# Patient Record
Sex: Male | Born: 1955 | Race: Black or African American | Hispanic: No | Marital: Married | Smoking: Current every day smoker
Health system: Southern US, Community
[De-identification: ages and names within clinical notes are randomized; demographics above are authoritative.]

## PROBLEM LIST (undated history)

## (undated) ENCOUNTER — Emergency Department (HOSPITAL_COMMUNITY): Disposition: A | Discharge status: Home / Self Care | Payer: Self-pay

## (undated) DIAGNOSIS — Z8711 Personal history of peptic ulcer disease: Secondary | ICD-10-CM

## (undated) DIAGNOSIS — Z9989 Dependence on other enabling machines and devices: Secondary | ICD-10-CM

## (undated) DIAGNOSIS — R51 Headache: Secondary | ICD-10-CM

## (undated) DIAGNOSIS — H269 Unspecified cataract: Secondary | ICD-10-CM

## (undated) DIAGNOSIS — K259 Gastric ulcer, unspecified as acute or chronic, without hemorrhage or perforation: Secondary | ICD-10-CM

## (undated) DIAGNOSIS — W3400XA Accidental discharge from unspecified firearms or gun, initial encounter: Secondary | ICD-10-CM

## (undated) DIAGNOSIS — K219 Gastro-esophageal reflux disease without esophagitis: Secondary | ICD-10-CM

## (undated) DIAGNOSIS — R9431 Abnormal electrocardiogram [ECG] [EKG]: Secondary | ICD-10-CM

## (undated) DIAGNOSIS — I10 Essential (primary) hypertension: Secondary | ICD-10-CM

## (undated) DIAGNOSIS — E119 Type 2 diabetes mellitus without complications: Secondary | ICD-10-CM

## (undated) DIAGNOSIS — R519 Headache, unspecified: Secondary | ICD-10-CM

## (undated) DIAGNOSIS — J189 Pneumonia, unspecified organism: Secondary | ICD-10-CM

## (undated) DIAGNOSIS — C801 Malignant (primary) neoplasm, unspecified: Secondary | ICD-10-CM

## (undated) DIAGNOSIS — F1010 Alcohol abuse, uncomplicated: Secondary | ICD-10-CM

## (undated) DIAGNOSIS — Z973 Presence of spectacles and contact lenses: Secondary | ICD-10-CM

## (undated) DIAGNOSIS — IMO0002 Reserved for concepts with insufficient information to code with codable children: Secondary | ICD-10-CM

## (undated) DIAGNOSIS — Z8719 Personal history of other diseases of the digestive system: Secondary | ICD-10-CM

## (undated) DIAGNOSIS — I639 Cerebral infarction, unspecified: Secondary | ICD-10-CM

## (undated) DIAGNOSIS — I219 Acute myocardial infarction, unspecified: Secondary | ICD-10-CM

## (undated) DIAGNOSIS — H409 Unspecified glaucoma: Secondary | ICD-10-CM

## (undated) HISTORY — DX: Unspecified cataract: H26.9

## (undated) HISTORY — PX: UPPER GASTROINTESTINAL ENDOSCOPY: SHX188

## (undated) HISTORY — DX: Type 2 diabetes mellitus without complications: E11.9

## (undated) HISTORY — PX: SHOULDER SURGERY: SHX246

## (undated) HISTORY — PX: ABDOMINAL SURGERY: SHX537

## (undated) HISTORY — PX: ANTRECTOMY: SHX5722

---
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
## 1997-08-24 ENCOUNTER — Emergency Department (HOSPITAL_COMMUNITY): Admission: EM | Admit: 1997-08-24 | Discharge: 1997-08-24 | Payer: Self-pay | Admitting: Emergency Medicine

## 1997-10-06 ENCOUNTER — Emergency Department (HOSPITAL_COMMUNITY): Admission: EM | Admit: 1997-10-06 | Discharge: 1997-10-06 | Payer: Self-pay | Admitting: *Deleted

## 1998-04-30 ENCOUNTER — Emergency Department (HOSPITAL_COMMUNITY): Admission: EM | Admit: 1998-04-30 | Discharge: 1998-04-30 | Payer: Self-pay | Admitting: Emergency Medicine

## 1998-05-06 ENCOUNTER — Emergency Department (HOSPITAL_COMMUNITY): Admission: EM | Admit: 1998-05-06 | Discharge: 1998-05-06 | Payer: Self-pay | Admitting: Emergency Medicine

## 1998-09-20 ENCOUNTER — Emergency Department (HOSPITAL_COMMUNITY): Admission: EM | Admit: 1998-09-20 | Discharge: 1998-09-20 | Payer: Self-pay | Admitting: Emergency Medicine

## 2002-03-21 ENCOUNTER — Encounter: Payer: Self-pay | Admitting: Emergency Medicine

## 2002-03-21 ENCOUNTER — Emergency Department (HOSPITAL_COMMUNITY): Admission: EM | Admit: 2002-03-21 | Discharge: 2002-03-21 | Payer: Self-pay | Admitting: Emergency Medicine

## 2003-02-07 ENCOUNTER — Encounter: Payer: Self-pay | Admitting: Emergency Medicine

## 2003-02-07 ENCOUNTER — Inpatient Hospital Stay (HOSPITAL_COMMUNITY): Admission: EM | Admit: 2003-02-07 | Discharge: 2003-02-11 | Payer: Self-pay | Admitting: Emergency Medicine

## 2003-02-08 ENCOUNTER — Encounter: Payer: Self-pay | Admitting: Internal Medicine

## 2003-02-10 ENCOUNTER — Encounter: Payer: Self-pay | Admitting: Internal Medicine

## 2003-05-03 ENCOUNTER — Emergency Department (HOSPITAL_COMMUNITY): Admission: EM | Admit: 2003-05-03 | Discharge: 2003-05-03 | Payer: Self-pay | Admitting: Emergency Medicine

## 2005-06-06 ENCOUNTER — Inpatient Hospital Stay (HOSPITAL_COMMUNITY): Admission: EM | Admit: 2005-06-06 | Discharge: 2005-06-10 | Payer: Self-pay | Admitting: Emergency Medicine

## 2006-12-28 ENCOUNTER — Ambulatory Visit: Payer: Self-pay | Admitting: Family Medicine

## 2006-12-28 ENCOUNTER — Inpatient Hospital Stay (HOSPITAL_COMMUNITY): Admission: EM | Admit: 2006-12-28 | Discharge: 2007-01-01 | Payer: Self-pay | Admitting: Emergency Medicine

## 2007-01-15 ENCOUNTER — Inpatient Hospital Stay (HOSPITAL_COMMUNITY): Admission: EM | Admit: 2007-01-15 | Discharge: 2007-01-18 | Payer: Self-pay | Admitting: Emergency Medicine

## 2007-04-02 ENCOUNTER — Emergency Department (HOSPITAL_COMMUNITY): Admission: EM | Admit: 2007-04-02 | Discharge: 2007-04-02 | Payer: Self-pay | Admitting: Emergency Medicine

## 2007-05-07 ENCOUNTER — Inpatient Hospital Stay (HOSPITAL_COMMUNITY): Admission: EM | Admit: 2007-05-07 | Discharge: 2007-05-09 | Payer: Self-pay | Admitting: Emergency Medicine

## 2007-05-07 ENCOUNTER — Ambulatory Visit: Payer: Self-pay | Admitting: Family Medicine

## 2007-05-20 ENCOUNTER — Inpatient Hospital Stay (HOSPITAL_COMMUNITY): Admission: EM | Admit: 2007-05-20 | Discharge: 2007-05-26 | Payer: Self-pay | Admitting: Emergency Medicine

## 2007-05-20 ENCOUNTER — Ambulatory Visit: Payer: Self-pay | Admitting: Family Medicine

## 2007-05-29 ENCOUNTER — Inpatient Hospital Stay (HOSPITAL_COMMUNITY): Admission: EM | Admit: 2007-05-29 | Discharge: 2007-05-31 | Payer: Self-pay | Admitting: Emergency Medicine

## 2007-05-29 ENCOUNTER — Ambulatory Visit: Payer: Self-pay | Admitting: Family Medicine

## 2007-06-19 ENCOUNTER — Encounter: Payer: Self-pay | Admitting: *Deleted

## 2007-07-04 ENCOUNTER — Emergency Department (HOSPITAL_COMMUNITY): Admission: EM | Admit: 2007-07-04 | Discharge: 2007-07-04 | Payer: Self-pay | Admitting: Emergency Medicine

## 2007-08-19 ENCOUNTER — Ambulatory Visit: Payer: Self-pay | Admitting: Family Medicine

## 2007-08-19 ENCOUNTER — Inpatient Hospital Stay (HOSPITAL_COMMUNITY): Admission: EM | Admit: 2007-08-19 | Discharge: 2007-08-25 | Payer: Self-pay | Admitting: Emergency Medicine

## 2007-09-07 ENCOUNTER — Emergency Department (HOSPITAL_COMMUNITY): Admission: EM | Admit: 2007-09-07 | Discharge: 2007-09-08 | Payer: Self-pay | Admitting: Emergency Medicine

## 2007-09-09 ENCOUNTER — Emergency Department: Payer: Self-pay | Admitting: Emergency Medicine

## 2007-10-22 ENCOUNTER — Emergency Department (HOSPITAL_COMMUNITY): Admission: EM | Admit: 2007-10-22 | Discharge: 2007-10-23 | Payer: Self-pay | Admitting: Emergency Medicine

## 2007-11-18 ENCOUNTER — Emergency Department (HOSPITAL_COMMUNITY): Admission: EM | Admit: 2007-11-18 | Discharge: 2007-11-19 | Payer: Self-pay | Admitting: Emergency Medicine

## 2008-04-06 ENCOUNTER — Ambulatory Visit: Payer: Self-pay | Admitting: Family Medicine

## 2008-04-06 ENCOUNTER — Inpatient Hospital Stay (HOSPITAL_COMMUNITY): Admission: EM | Admit: 2008-04-06 | Discharge: 2008-04-08 | Payer: Self-pay | Admitting: Emergency Medicine

## 2008-04-15 ENCOUNTER — Ambulatory Visit: Payer: Self-pay | Admitting: Family Medicine

## 2008-04-15 DIAGNOSIS — D1803 Hemangioma of intra-abdominal structures: Secondary | ICD-10-CM

## 2008-04-15 DIAGNOSIS — A539 Syphilis, unspecified: Secondary | ICD-10-CM

## 2008-04-15 DIAGNOSIS — F1020 Alcohol dependence, uncomplicated: Secondary | ICD-10-CM

## 2008-04-15 DIAGNOSIS — Z720 Tobacco use: Secondary | ICD-10-CM | POA: Insufficient documentation

## 2008-04-15 DIAGNOSIS — F17200 Nicotine dependence, unspecified, uncomplicated: Secondary | ICD-10-CM

## 2008-04-15 DIAGNOSIS — H409 Unspecified glaucoma: Secondary | ICD-10-CM | POA: Insufficient documentation

## 2008-04-15 HISTORY — DX: Hemangioma of intra-abdominal structures: D18.03

## 2008-04-15 HISTORY — DX: Syphilis, unspecified: A53.9

## 2008-04-19 ENCOUNTER — Emergency Department (HOSPITAL_COMMUNITY): Admission: EM | Admit: 2008-04-19 | Discharge: 2008-04-20 | Payer: Self-pay | Admitting: Emergency Medicine

## 2008-04-20 ENCOUNTER — Ambulatory Visit: Payer: Self-pay | Admitting: Psychiatry

## 2008-04-20 ENCOUNTER — Inpatient Hospital Stay (HOSPITAL_COMMUNITY): Admission: EM | Admit: 2008-04-20 | Discharge: 2008-04-26 | Payer: Self-pay | Admitting: Psychiatry

## 2008-07-29 ENCOUNTER — Inpatient Hospital Stay (HOSPITAL_COMMUNITY): Admission: EM | Admit: 2008-07-29 | Discharge: 2008-07-30 | Payer: Self-pay | Admitting: Emergency Medicine

## 2008-07-29 ENCOUNTER — Ambulatory Visit: Payer: Self-pay | Admitting: Family Medicine

## 2008-07-29 ENCOUNTER — Encounter: Payer: Self-pay | Admitting: Family Medicine

## 2008-07-29 DIAGNOSIS — Z8719 Personal history of other diseases of the digestive system: Secondary | ICD-10-CM

## 2008-08-25 ENCOUNTER — Ambulatory Visit: Payer: Self-pay | Admitting: Family Medicine

## 2008-08-25 ENCOUNTER — Emergency Department (HOSPITAL_COMMUNITY): Admission: EM | Admit: 2008-08-25 | Discharge: 2008-08-26 | Payer: Self-pay | Admitting: Emergency Medicine

## 2008-08-26 ENCOUNTER — Encounter: Payer: Self-pay | Admitting: Family Medicine

## 2008-08-29 ENCOUNTER — Ambulatory Visit: Payer: Self-pay | Admitting: Family Medicine

## 2008-08-29 DIAGNOSIS — K529 Noninfective gastroenteritis and colitis, unspecified: Secondary | ICD-10-CM | POA: Insufficient documentation

## 2008-10-09 ENCOUNTER — Emergency Department (HOSPITAL_COMMUNITY): Admission: EM | Admit: 2008-10-09 | Discharge: 2008-10-09 | Payer: Self-pay | Admitting: Emergency Medicine

## 2008-11-06 ENCOUNTER — Emergency Department (HOSPITAL_COMMUNITY): Admission: EM | Admit: 2008-11-06 | Discharge: 2008-11-06 | Payer: Self-pay | Admitting: Emergency Medicine

## 2008-11-18 ENCOUNTER — Ambulatory Visit: Payer: Self-pay | Admitting: Psychiatry

## 2008-11-18 ENCOUNTER — Emergency Department (HOSPITAL_COMMUNITY): Admission: EM | Admit: 2008-11-18 | Discharge: 2008-11-18 | Payer: Self-pay | Admitting: Emergency Medicine

## 2008-11-18 ENCOUNTER — Inpatient Hospital Stay (HOSPITAL_COMMUNITY): Admission: AD | Admit: 2008-11-18 | Discharge: 2008-11-24 | Payer: Self-pay | Admitting: Psychiatry

## 2009-05-26 ENCOUNTER — Emergency Department (HOSPITAL_COMMUNITY): Admission: EM | Admit: 2009-05-26 | Discharge: 2009-05-26 | Payer: Self-pay | Admitting: Emergency Medicine

## 2009-06-19 ENCOUNTER — Emergency Department (HOSPITAL_COMMUNITY): Admission: EM | Admit: 2009-06-19 | Discharge: 2009-06-20 | Payer: Self-pay | Admitting: Emergency Medicine

## 2009-09-22 ENCOUNTER — Inpatient Hospital Stay (HOSPITAL_COMMUNITY)
Admission: EM | Admit: 2009-09-22 | Discharge: 2009-09-24 | Payer: Self-pay | Source: Home / Self Care | Admitting: Emergency Medicine

## 2009-09-22 ENCOUNTER — Encounter: Payer: Self-pay | Admitting: Family Medicine

## 2009-09-22 ENCOUNTER — Ambulatory Visit: Payer: Self-pay | Admitting: Family Medicine

## 2009-09-30 ENCOUNTER — Telehealth: Payer: Self-pay | Admitting: Family Medicine

## 2009-10-29 ENCOUNTER — Emergency Department (HOSPITAL_COMMUNITY): Admission: EM | Admit: 2009-10-29 | Discharge: 2009-10-30 | Payer: Self-pay | Admitting: Emergency Medicine

## 2009-12-18 ENCOUNTER — Emergency Department (HOSPITAL_COMMUNITY): Admission: EM | Admit: 2009-12-18 | Discharge: 2009-12-18 | Payer: Self-pay | Admitting: Emergency Medicine

## 2010-06-22 NOTE — Progress Notes (Signed)
Summary: phn msg  Phone Note Call from Patient Call back at [phone]   Caller: Patient Summary of Call: Pt calling because he can not make it in due to transportation.  Can Dr. Burnadette Pop give him a call. Initial call taken by: Clydell Hakim,  Sep 30, 2009 11:02 AM  Follow-up for Phone Call        to PCP Follow-up by: Gladstone Pih,  Sep 30, 2009 11:04 AM  Additional Follow-up for Phone Call Additional follow up Details #1::        Pt either needs to keep his appt or leave a message regarding his issue that he needs to discuss. Additional Follow-up by: [name]  MD,  Sep 30, 2009 12:25 PM     Appended Document: phn msg still hasn't been able to keep anything down and still having severe h/a and pain in stomach - BP is still high. cannot come in b/c he has no money, not even for the bus.  Appended Document: phn msg sent him to ED. states he has been hospitalized for similar issues before.he agreed with plan
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
## 2010-08-04 ENCOUNTER — Emergency Department (HOSPITAL_COMMUNITY)
Admission: EM | Admit: 2010-08-04 | Discharge: 2010-08-04 | Disposition: A | Discharge status: Home / Self Care | Payer: Self-pay | Attending: Emergency Medicine | Admitting: Emergency Medicine

## 2010-08-04 DIAGNOSIS — K2970 Gastritis, unspecified, without bleeding: Secondary | ICD-10-CM | POA: Insufficient documentation

## 2010-08-04 DIAGNOSIS — R109 Unspecified abdominal pain: Secondary | ICD-10-CM

## 2010-08-04 DIAGNOSIS — R1013 Epigastric pain: Secondary | ICD-10-CM | POA: Insufficient documentation

## 2010-08-04 DIAGNOSIS — R569 Unspecified convulsions: Secondary | ICD-10-CM | POA: Insufficient documentation

## 2010-08-04 DIAGNOSIS — K920 Hematemesis: Secondary | ICD-10-CM | POA: Insufficient documentation

## 2010-08-04 DIAGNOSIS — I10 Essential (primary) hypertension: Secondary | ICD-10-CM | POA: Insufficient documentation

## 2010-08-04 DIAGNOSIS — K2990 Gastroduodenitis, unspecified, without bleeding: Secondary | ICD-10-CM | POA: Insufficient documentation

## 2010-08-04 DIAGNOSIS — F1010 Alcohol abuse, uncomplicated: Secondary | ICD-10-CM | POA: Insufficient documentation

## 2010-08-04 LAB — COMPREHENSIVE METABOLIC PANEL
ALT: 36 U/L (ref 0–53)
AST: 41 U/L — ABNORMAL HIGH (ref 0–37)
Albumin: 3.8 g/dL (ref 3.5–5.2)
Alkaline Phosphatase: 84 U/L (ref 39–117)
Chloride: 104 mEq/L (ref 96–112)
GFR calc Af Amer: >60 mL/min (ref >60)
Potassium: 3.9 mEq/L (ref 3.5–5.1)
Sodium: 139 mEq/L (ref 135–145)
Total Bilirubin: 0.4 mg/dL (ref 0.3–1.2)
Total Protein: 7.5 g/dL (ref 6.0–8.3)

## 2010-08-04 LAB — DIFFERENTIAL
Basophils Absolute: 0 10*3/uL (ref 0.0–0.1)
Eosinophils Relative: 2 % (ref 0–5)
Lymphs Abs: 1.6 10*3/uL (ref 0.7–4.0)
Monocytes Absolute: 0.4 10*3/uL (ref 0.1–1.0)

## 2010-08-04 LAB — URINALYSIS, ROUTINE W REFLEX MICROSCOPIC
Glucose, UA: NEGATIVE mg/dL
Hgb urine dipstick: NEGATIVE
Protein, ur: NEGATIVE mg/dL
pH: 5 (ref 5.0–8.0)

## 2010-08-04 LAB — CBC
HCT: 41.4 % (ref 39.0–52.0)
MCH: 25.7 pg — ABNORMAL LOW (ref 26.0–34.0)
MCHC: 36 g/dL (ref 30.0–36.0)
RDW: 14.5 % (ref 11.5–15.5)

## 2010-08-04 LAB — RAPID URINE DRUG SCREEN, HOSP PERFORMED
Amphetamines: NOT DETECTED
Benzodiazepines: NOT DETECTED
Cocaine: NOT DETECTED

## 2010-08-04 LAB — POCT I-STAT, CHEM 8
Calcium, Ion: 1.08 mmol/L — ABNORMAL LOW (ref 1.12–1.32)
Glucose, Bld: 99 mg/dL (ref 70–99)
HCT: 49 % (ref 39.0–52.0)
Hemoglobin: 16.7 g/dL (ref 13.0–17.0)

## 2010-08-04 LAB — ETHANOL: Alcohol, Ethyl (B): 252 mg/dL — ABNORMAL HIGH (ref 0–10)

## 2010-08-04 LAB — POCT CARDIAC MARKERS: Myoglobin, poc: 61.5 ng/mL (ref 12–200)

## 2010-08-08 LAB — URINALYSIS, ROUTINE W REFLEX MICROSCOPIC
Ketones, ur: 15 mg/dL — AB
Protein, ur: NEGATIVE mg/dL
Urobilinogen, UA: 1 mg/dL (ref 0.0–1.0)

## 2010-08-08 LAB — BASIC METABOLIC PANEL
BUN: 13 mg/dL (ref 6–23)
CO2: 27 mEq/L (ref 19–32)
Calcium: 9.6 mg/dL (ref 8.4–10.5)
GFR calc non Af Amer: >60 mL/min (ref >60)
Glucose, Bld: 87 mg/dL (ref 70–99)
Potassium: 4.2 mEq/L (ref 3.5–5.1)
Sodium: 135 mEq/L (ref 135–145)

## 2010-08-08 LAB — HEPATIC FUNCTION PANEL
ALT: 21 U/L (ref 0–53)
AST: 20 U/L (ref 0–37)
Albumin: 4.4 g/dL (ref 3.5–5.2)
Bilirubin, Direct: 0.2 mg/dL (ref 0.0–0.3)
Indirect Bilirubin: 0.5 mg/dL (ref 0.3–0.9)
Total Bilirubin: 0.7 mg/dL (ref 0.3–1.2)
Total Bilirubin: 1.1 mg/dL (ref 0.3–1.2)

## 2010-08-08 LAB — DIFFERENTIAL
Basophils Absolute: 0 10*3/uL (ref 0.0–0.1)
Basophils Relative: 0 % (ref 0–1)
Basophils Relative: 1 % (ref 0–1)
Eosinophils Relative: 2 % (ref 0–5)
Lymphocytes Relative: 43 % (ref 12–46)
Lymphocytes Relative: 22 % (ref 12–46)
Lymphs Abs: 1.2 10*3/uL (ref 0.7–4.0)
Monocytes Absolute: 0.4 10*3/uL (ref 0.1–1.0)
Monocytes Absolute: 0.6 10*3/uL (ref 0.1–1.0)
Monocytes Relative: 11 % (ref 3–12)
Neutro Abs: 3.5 10*3/uL (ref 1.7–7.7)
Neutrophils Relative %: 67 % (ref 43–77)

## 2010-08-08 LAB — GLUCOSE, CAPILLARY: Glucose-Capillary: 92 mg/dL (ref 70–99)

## 2010-08-08 LAB — POCT I-STAT, CHEM 8
BUN: 14 mg/dL (ref 6–23)
Calcium, Ion: 1.08 mmol/L — ABNORMAL LOW (ref 1.12–1.32)
Chloride: 101 mEq/L (ref 96–112)
Glucose, Bld: 123 mg/dL — ABNORMAL HIGH (ref 70–99)
HCT: 57 % — ABNORMAL HIGH (ref 39.0–52.0)
Potassium: 4.2 mEq/L (ref 3.5–5.1)

## 2010-08-08 LAB — CBC
HCT: 49.2 % (ref 39.0–52.0)
Hemoglobin: 16.4 g/dL (ref 13.0–17.0)
Hemoglobin: 16.6 g/dL (ref 13.0–17.0)
MCHC: 33.3 g/dL (ref 30.0–36.0)
MCHC: 32.3 g/dL (ref 30.0–36.0)
Platelets: 221 10*3/uL (ref 150–400)
RBC: 6.61 MIL/uL — ABNORMAL HIGH (ref 4.22–5.81)
RDW: 13.8 % (ref 11.5–15.5)
WBC: 5.3 10*3/uL (ref 4.0–10.5)

## 2010-08-08 LAB — LIPASE, BLOOD: Lipase: 15 U/L (ref 11–59)

## 2010-08-09 LAB — CBC
HCT: 46.1 % (ref 39.0–52.0)
Hemoglobin: 15.4 g/dL (ref 13.0–17.0)
Platelets: 245 10*3/uL (ref 150–400)
RBC: 5.97 MIL/uL — ABNORMAL HIGH (ref 4.22–5.81)
RDW: 14.8 % (ref 11.5–15.5)

## 2010-08-09 LAB — POCT CARDIAC MARKERS
CKMB, poc: <1 ng/mL — ABNORMAL LOW (ref 1.0–8.0)
Troponin i, poc: <0.05 ng/mL (ref 0.00–0.09)

## 2010-08-09 LAB — COMPREHENSIVE METABOLIC PANEL
AST: 22 U/L (ref 0–37)
Albumin: 4.1 g/dL (ref 3.5–5.2)
Alkaline Phosphatase: 101 U/L (ref 39–117)
BUN: 4 mg/dL — ABNORMAL LOW (ref 6–23)
Chloride: 107 mEq/L (ref 96–112)
Creatinine, Ser: 0.97 mg/dL (ref 0.4–1.5)
GFR calc Af Amer: >60 mL/min (ref >60)
Potassium: 3.7 mEq/L (ref 3.5–5.1)
Total Bilirubin: 0.4 mg/dL (ref 0.3–1.2)
Total Protein: 7.3 g/dL (ref 6.0–8.3)

## 2010-08-09 LAB — URINALYSIS, ROUTINE W REFLEX MICROSCOPIC
Bilirubin Urine: NEGATIVE
Glucose, UA: NEGATIVE mg/dL
Hgb urine dipstick: NEGATIVE
Ketones, ur: 15 mg/dL — AB
pH: 5.5 (ref 5.0–8.0)

## 2010-08-09 LAB — LACTIC ACID, PLASMA: Lactic Acid, Venous: 2.1 mmol/L (ref 0.5–2.2)

## 2010-08-09 LAB — DIFFERENTIAL
Basophils Relative: 0 % (ref 0–1)
Lymphocytes Relative: 34 % (ref 12–46)
Lymphs Abs: 1.7 10*3/uL (ref 0.7–4.0)
Monocytes Relative: 5 % (ref 3–12)
Neutro Abs: 2.9 10*3/uL (ref 1.7–7.7)

## 2010-08-10 LAB — CARDIAC PANEL(CRET KIN+CKTOT+MB+TROPI)
CK, MB: 0.5 ng/mL (ref 0.3–4.0)
Relative Index: INVALID (ref 0.0–2.5)
Troponin I: 0.01 ng/mL (ref 0.00–0.06)

## 2010-08-10 LAB — COMPREHENSIVE METABOLIC PANEL
ALT: 19 U/L (ref 0–53)
AST: 16 U/L (ref 0–37)
AST: 23 U/L (ref 0–37)
Albumin: 3 g/dL — ABNORMAL LOW (ref 3.5–5.2)
Albumin: 4.2 g/dL (ref 3.5–5.2)
Alkaline Phosphatase: 82 U/L (ref 39–117)
BUN: 9 mg/dL (ref 6–23)
Calcium: 8.5 mg/dL (ref 8.4–10.5)
Chloride: 97 mEq/L (ref 96–112)
Creatinine, Ser: 0.99 mg/dL (ref 0.4–1.5)
GFR calc Af Amer: >60 mL/min (ref >60)
GFR calc Af Amer: >60 mL/min (ref >60)
Potassium: 3.6 mEq/L (ref 3.5–5.1)
Sodium: 135 mEq/L (ref 135–145)
Total Protein: 7.6 g/dL (ref 6.0–8.3)

## 2010-08-10 LAB — URINALYSIS, ROUTINE W REFLEX MICROSCOPIC
Ketones, ur: >80 mg/dL — AB
Nitrite: NEGATIVE
Specific Gravity, Urine: 1.022 (ref 1.005–1.030)
Urobilinogen, UA: 1 mg/dL (ref 0.0–1.0)

## 2010-08-10 LAB — PROTIME-INR: INR: 1.03 (ref 0.00–1.49)

## 2010-08-10 LAB — DIFFERENTIAL
Basophils Relative: 0 % (ref 0–1)
Eosinophils Absolute: 0 10*3/uL (ref 0.0–0.7)
Lymphocytes Relative: 30 % (ref 12–46)
Lymphs Abs: 1.7 10*3/uL (ref 0.7–4.0)
Neutro Abs: 3.4 10*3/uL (ref 1.7–7.7)

## 2010-08-10 LAB — CK TOTAL AND CKMB (NOT AT ARMC)
CK, MB: 0.8 ng/mL (ref 0.3–4.0)
Total CK: 104 U/L (ref 7–232)
Total CK: 136 U/L (ref 7–232)

## 2010-08-10 LAB — CBC
Platelets: 245 10*3/uL (ref 150–400)
RDW: 14.1 % (ref 11.5–15.5)
WBC: 5.7 10*3/uL (ref 4.0–10.5)

## 2010-08-10 LAB — APTT: aPTT: 27 seconds (ref 24–37)

## 2010-08-10 LAB — RAPID URINE DRUG SCREEN, HOSP PERFORMED
Amphetamines: NOT DETECTED
Opiates: NOT DETECTED
Tetrahydrocannabinol: NOT DETECTED

## 2010-08-30 LAB — HEMOCCULT GUIAC POC 1CARD (OFFICE): Fecal Occult Bld: NEGATIVE

## 2010-08-30 LAB — COMPREHENSIVE METABOLIC PANEL
BUN: 4 mg/dL — ABNORMAL LOW (ref 6–23)
CO2: 23 mEq/L (ref 19–32)
Calcium: 9.3 mg/dL (ref 8.4–10.5)
Creatinine, Ser: 0.8 mg/dL (ref 0.4–1.5)
GFR calc non Af Amer: >60 mL/min (ref >60)
Glucose, Bld: 99 mg/dL (ref 70–99)
Sodium: 141 mEq/L (ref 135–145)
Total Protein: 7.1 g/dL (ref 6.0–8.3)

## 2010-08-30 LAB — LIPASE, BLOOD: Lipase: 124 U/L — ABNORMAL HIGH (ref 11–59)

## 2010-08-30 LAB — DIFFERENTIAL
Basophils Relative: 0 % (ref 0–1)
Eosinophils Absolute: 0.1 10*3/uL (ref 0.0–0.7)
Eosinophils Relative: 2 % (ref 0–5)
Lymphocytes Relative: 37 % (ref 12–46)
Monocytes Relative: 5 % (ref 3–12)
Neutro Abs: 3.1 10*3/uL (ref 1.7–7.7)
Neutrophils Relative %: 56 % (ref 43–77)

## 2010-08-30 LAB — RAPID URINE DRUG SCREEN, HOSP PERFORMED
Benzodiazepines: NOT DETECTED
Cocaine: NOT DETECTED
Tetrahydrocannabinol: NOT DETECTED

## 2010-08-30 LAB — CBC
HCT: 47 % (ref 39.0–52.0)
Hemoglobin: 15.2 g/dL (ref 13.0–17.0)
RBC: 5.86 MIL/uL — ABNORMAL HIGH (ref 4.22–5.81)
RDW: 15 % (ref 11.5–15.5)
WBC: 5.6 10*3/uL (ref 4.0–10.5)

## 2010-08-30 LAB — ETHANOL: Alcohol, Ethyl (B): 164 mg/dL — ABNORMAL HIGH (ref 0–10)

## 2010-08-30 LAB — GLUCOSE, CAPILLARY: Glucose-Capillary: 101 mg/dL — ABNORMAL HIGH (ref 70–99)

## 2010-08-30 LAB — HEMOGLOBIN AND HEMATOCRIT, BLOOD: Hemoglobin: 14.1 g/dL (ref 13.0–17.0)

## 2010-08-31 LAB — DIFFERENTIAL
Basophils Absolute: 0.1 10*3/uL (ref 0.0–0.1)
Basophils Relative: 1 % (ref 0–1)
Eosinophils Absolute: 0.1 10*3/uL (ref 0.0–0.7)
Neutrophils Relative %: 60 % (ref 43–77)

## 2010-08-31 LAB — COMPREHENSIVE METABOLIC PANEL
ALT: 18 U/L (ref 0–53)
Alkaline Phosphatase: 72 U/L (ref 39–117)
CO2: 29 mEq/L (ref 19–32)
GFR calc non Af Amer: >60 mL/min (ref >60)
Glucose, Bld: 106 mg/dL — ABNORMAL HIGH (ref 70–99)
Potassium: 4.2 mEq/L (ref 3.5–5.1)
Sodium: 138 mEq/L (ref 135–145)
Total Bilirubin: 0.4 mg/dL (ref 0.3–1.2)

## 2010-08-31 LAB — CBC
HCT: 44.2 % (ref 39.0–52.0)
Hemoglobin: 14.6 g/dL (ref 13.0–17.0)
RBC: 5.58 MIL/uL (ref 4.22–5.81)

## 2010-08-31 LAB — URINE CULTURE

## 2010-08-31 LAB — URINALYSIS, ROUTINE W REFLEX MICROSCOPIC
Glucose, UA: NEGATIVE mg/dL
Hgb urine dipstick: NEGATIVE
Ketones, ur: NEGATIVE mg/dL
Protein, ur: NEGATIVE mg/dL
Urobilinogen, UA: 1 mg/dL (ref 0.0–1.0)

## 2010-08-31 LAB — LIPASE, BLOOD: Lipase: 19 U/L (ref 11–59)

## 2010-09-01 LAB — CBC
HCT: 49.2 % (ref 39.0–52.0)
Platelets: 202 10*3/uL (ref 150–400)
RDW: 14.8 % (ref 11.5–15.5)

## 2010-09-01 LAB — URINALYSIS, ROUTINE W REFLEX MICROSCOPIC
Glucose, UA: NEGATIVE mg/dL
Ketones, ur: 40 mg/dL — AB
Nitrite: POSITIVE — AB
Protein, ur: 100 mg/dL — AB

## 2010-09-01 LAB — DIFFERENTIAL
Lymphocytes Relative: 31 % (ref 12–46)
Lymphs Abs: 1.4 10*3/uL (ref 0.7–4.0)
Monocytes Absolute: 0.5 10*3/uL (ref 0.1–1.0)
Monocytes Relative: 11 % (ref 3–12)
Neutro Abs: 2.6 10*3/uL (ref 1.7–7.7)

## 2010-09-01 LAB — COMPREHENSIVE METABOLIC PANEL
Albumin: 4.2 g/dL (ref 3.5–5.2)
BUN: 8 mg/dL (ref 6–23)
Calcium: 9.7 mg/dL (ref 8.4–10.5)
Creatinine, Ser: 1.1 mg/dL (ref 0.4–1.5)
Potassium: 3.5 mEq/L (ref 3.5–5.1)
Total Protein: 7.4 g/dL (ref 6.0–8.3)

## 2010-09-01 LAB — URINE CULTURE

## 2010-09-01 LAB — URINE MICROSCOPIC-ADD ON

## 2010-09-02 LAB — COMPREHENSIVE METABOLIC PANEL
ALT: 19 U/L (ref 0–53)
Alkaline Phosphatase: 71 U/L (ref 39–117)
Alkaline Phosphatase: 81 U/L (ref 39–117)
BUN: 4 mg/dL — ABNORMAL LOW (ref 6–23)
BUN: 6 mg/dL (ref 6–23)
CO2: 28 mEq/L (ref 19–32)
Calcium: 8.7 mg/dL (ref 8.4–10.5)
Chloride: 98 mEq/L (ref 96–112)
GFR calc non Af Amer: >60 mL/min (ref >60)
GFR calc non Af Amer: >60 mL/min (ref >60)
Glucose, Bld: 81 mg/dL (ref 70–99)
Glucose, Bld: 99 mg/dL (ref 70–99)
Potassium: 3.5 mEq/L (ref 3.5–5.1)
Potassium: 4.1 mEq/L (ref 3.5–5.1)
Total Bilirubin: 1.1 mg/dL (ref 0.3–1.2)
Total Protein: 6.4 g/dL (ref 6.0–8.3)
Total Protein: 6.6 g/dL (ref 6.0–8.3)

## 2010-09-02 LAB — DIFFERENTIAL
Basophils Relative: 1 % (ref 0–1)
Eosinophils Absolute: 0 10*3/uL (ref 0.0–0.7)
Monocytes Absolute: 0.6 10*3/uL (ref 0.1–1.0)
Monocytes Relative: 11 % (ref 3–12)

## 2010-09-02 LAB — CARDIAC PANEL(CRET KIN+CKTOT+MB+TROPI)
Relative Index: 0.5 (ref 0.0–2.5)
Relative Index: 0.5 (ref 0.0–2.5)
Total CK: 174 U/L (ref 7–232)
Troponin I: <0.01 ng/mL (ref 0.00–0.06)

## 2010-09-02 LAB — URINALYSIS, ROUTINE W REFLEX MICROSCOPIC
Glucose, UA: NEGATIVE mg/dL
Hgb urine dipstick: NEGATIVE
Protein, ur: 100 mg/dL — AB

## 2010-09-02 LAB — URINE MICROSCOPIC-ADD ON

## 2010-09-02 LAB — HEPATIC FUNCTION PANEL
ALT: 31 U/L (ref 0–53)
AST: 55 U/L — ABNORMAL HIGH (ref 0–37)
Albumin: 4 g/dL (ref 3.5–5.2)
Total Bilirubin: 1.9 mg/dL — ABNORMAL HIGH (ref 0.3–1.2)

## 2010-09-02 LAB — POCT I-STAT, CHEM 8
BUN: 8 mg/dL (ref 6–23)
Creatinine, Ser: 0.9 mg/dL (ref 0.4–1.5)
Potassium: 4.9 mEq/L (ref 3.5–5.1)
Sodium: 136 mEq/L (ref 135–145)

## 2010-09-02 LAB — CBC
HCT: 41.3 % (ref 39.0–52.0)
Hemoglobin: 13.9 g/dL (ref 13.0–17.0)
Hemoglobin: 16.9 g/dL (ref 13.0–17.0)
MCHC: 33.6 g/dL (ref 30.0–36.0)
MCV: 77.2 fL — ABNORMAL LOW (ref 78.0–100.0)
RBC: 5.33 MIL/uL (ref 4.22–5.81)
RBC: 6.5 MIL/uL — ABNORMAL HIGH (ref 4.22–5.81)
RDW: 14.8 % (ref 11.5–15.5)

## 2010-09-02 LAB — POCT CARDIAC MARKERS
CKMB, poc: <1 ng/mL — ABNORMAL LOW (ref 1.0–8.0)
Myoglobin, poc: 43.7 ng/mL (ref 12–200)
Troponin i, poc: <0.05 ng/mL (ref 0.00–0.09)
Troponin i, poc: <0.05 ng/mL (ref 0.00–0.09)

## 2010-09-02 LAB — TROPONIN I: Troponin I: <0.01 ng/mL (ref 0.00–0.06)

## 2010-09-02 LAB — HIV ANTIBODY (ROUTINE TESTING W REFLEX): HIV: NONREACTIVE

## 2010-09-02 LAB — RPR: RPR Ser Ql: REACTIVE — AB

## 2010-09-02 LAB — CK TOTAL AND CKMB (NOT AT ARMC)
CK, MB: 0.8 ng/mL (ref 0.3–4.0)
Total CK: 198 U/L (ref 7–232)

## 2010-09-02 LAB — LIPASE, BLOOD: Lipase: 17 U/L (ref 11–59)

## 2010-10-05 NOTE — Discharge Summary (Signed)
NAME:  Troy Mclaughlin, Troy Mclaughlin NO.:  [id number]   MEDICAL RECORD NO.:  [id number]          PATIENT TYPE:  INP   LOCATION:  4703                         FACILITY:  [facility]   PHYSICIAN:  Zenaida Deed. Mayford Knife, M.D.DATE OF BIRTH:  November 29, 1955   DATE OF ADMISSION:  05/20/2007  DATE OF DISCHARGE:  05/26/2007                               DISCHARGE SUMMARY   PRIMARY CARE PHYSICIAN:  [name], M.D. at [facility]   PRIMARY DIAGNOSES:  1. Alcohol abuse.  2. Hepatic hemangioma, benign.  3. Gastritis.  4. Constipation.   DISCHARGE MEDICATIONS:  1. Protonix 40 mg p.o. once daily.  2. Colace 100 mg p.o. twice daily.   CONSULTATIONS:  None.   PROCEDURES:  1. The patient had an abdominal CT performed on May 20, 2007 that      showed no evidence of bowel obstruction or free intraperitoneal      air.  He had questionable constipation.  It did show a nodular      density at the right lung on the frontal view, likely artifactual      given the absence on CT in August 2008.  The patient had a CT of      the abdomen performed on January 18, 2007 that showed nonobstructive      left lower pole renal calculus and no apparent change in the      attenuation lesion in the posterior right lobe of the liver      compared to 2004.  It was most consistent with hemangioma.  He did      have a moderate amount of feces in the colon, particularly the      rectosigmoid colon where fecal impaction may be present.  2. The patient had an MRI of the abdomen that showed a benign      appearing 2.8-cm hemangioma in the posterior aspect of the right      lobe of the liver.  The patient had an abdominal KUB performed on      May 23, 2007 that showed no signs of ileus or obstruction or      free air.   LABORATORIES:  On the day of admission, December 28th, the patient's  white blood cell count was 4, hemoglobin 15.3, platelets 280.  Urinalysis was negative.  Urine drug  screen was negative.  Urine  tricyclics were negative.  Hepatic function showed a total bilirubin of  1.2, AST 51, ALT 24, alkaline phosphatase 75, total protein 7 and  albumin of 3.9.  The patient did have an alcohol level of 57, lipase of  14.  Cardiac markers at point of care were all negative.  Sodium 136,  potassium 5.5, chloride 110, CO2 24.2, glucose 63, BUN 8, creatinine  1.1.  On the day of discharge, sodium was 138, potassium 4.1, chloride  106, CO2 27, BUN 12, creatinine 0.89, glucose 82.  White blood cell  count 5.4, hemoglobin 12.7, platelets 179.  Systolic blood pressure 110s  to 130s.   BRIEF HOSPITAL COURSE:  This is a 55 year old African-American male with  a history of alcohol abuse who was admitted for abdominal pain, nausea  and vomiting and presumed alcohol withdrawal.  1. Abdominal pain.  Upon initial exam, there was concern for a      possible ileus, given his prior history of ileus.  The patient      underwent an abdominal x-ray which showed no obstruction, no signs      of ileus and no free air.  The patient was treated initially with      oxycodone which relieved this pain somewhat.  The pain was thought      to be possibly due to gastritis secondary to alcohol abuse.  The      patient was treated with Protonix and placed on prophylactic      propranolol for presumed variceal.  The patient did note that he      had not had a bowel movement for several days.  His story did      change from 4 days to 11 days.  The patient was placed on an      intense bowel regimen and did eventually have several bowel      movements on the day of discharge.  2. Constipation.  The patient gave a story of being constipated from 4      to 11 days.  The patient was initially started on Reglan, MiraLax      and Dulcolax suppositories.  The patient did not have any bowel      movements for several days while hospitalized.  He underwent a KUB      and also abdominal CT which showed  that he did have a moderate      amount of stool but no signs of obstruction.  On the day of      discharge, the patient did have several stools and was discharged      on Colace for stool softener.  3. History of alcohol abuse.  The patient was placed on Ativan      protocol during his hospitalization.  He did not have any signs of      withdrawal.  No tremors or palpitations.  He did undergo a consult      with social work for placement for detox.  The patient is willing      to go to these facilities for detox.  He has support at [facility], and he is awaiting a bed at [facility].  4. Liver abnormality.  Upon admission, the patient had an abdominal CT      that showed a suspicious lesion on the liver thought to be      hemangioma.  Due to the risk of cancer, given his family history of      colon cancer, the patient underwent an MRI which showed a benign      cavernous hemangioma.  No other treatment for this was needed.  5. Disposition.  The patient is homeless.  He is unemployed and      desires to find a job.  I have talked to him extensively and will      make a phone call to human resources to see if any job      opportunities are possible.  He is going to be accepted into our      practice and will follow up with me on the 8th.  We will discuss      his alcohol cessation at  that time and his possible placement at      [facility] for further assistance.   DISCHARGE INSTRUCTIONS:  He is to be on a low-sodium heart healthy diet.  He has no restrictions to his activities.  He needs to stop drinking  alcohol and call his primary doctor or return to the ED if he  experiences any tremors or seizures.   FOLLOWUP:  He has a followup appointment with Dr. Burnadette Pop at [facility] on May 31, 2007 at 2:00 p.m.   CONDITION:  The patient is discharged in stable condition.      [name], MD  Electronically  Signed      Zenaida Deed. Mayford Knife, M.D.  Electronically Signed    KL/MEDQ  D:  05/26/2007  T:  05/26/2007  Job:  [id number]   cc:   [facility]
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
## 2010-10-05 NOTE — Discharge Summary (Signed)
NAME:  Troy Mclaughlin, Troy Mclaughlin NO.:  [id number]   MEDICAL RECORD NO.:  [id number]          PATIENT TYPE:  INP   LOCATION:  5124                         FACILITY:  [facility]   PHYSICIAN:  Leighton Roach McDiarmid, M.D.DATE OF BIRTH:  20-Nov-1955   DATE OF ADMISSION:  12/28/2006  DATE OF DISCHARGE:  01/01/2007                               DISCHARGE SUMMARY   PRIMARY CARE PHYSICIAN:  None.   CONSULTATIONS:  None.   PROCEDURE:  None.   REASON FOR ADMISSION:  Alcohol withdrawal.   PERTINENT ADMISSION LABS:  The patient had a hemoglobin of 14.8 on  admission.  Creatinine of 1.17.  He was heme negative and his alcohol  level was less than 5.   PRIMARY DISCHARGE DIAGNOSES:  1. Alcohol dependent/withdrawal.  2. Suicidal ideations.  3. Hypertension.   NEW MEDS STARTED ON DISCHARGE:  1. HCTZ 12.5 mg daily for high blood pressure.  2. Omeprazole 40 mg p.o. daily for alcoholic gastritis and      gastroesophageal reflux disease.   BRIEF HOSPITAL COURSE:  [name] is a 55 year old male with a history  of alcohol withdrawal seizures, and reported bright red blood per  rectum, and hematemesis.  However, this was not documented during his  stay.  He was admitted for Ativan protocol and possible rehab placement.  1. Regarding his alcohol dependence and withdrawal:  He did complete a      5-day Ativan protocol without seizure or any other problems.  His      hemoglobin did remain stable.  He had no documented bright red      blood per rectum or hematemesis.  He said that he did desire a      residential treatment program and he remained over the weekend      awaiting placement.  However, on Monday, January 01, 2007, a Equities trader did approach him with a possible placement at [facility].      The patient only needed to call the facility and express interest      in beginning their program.  The patient made several attempts in      front of the social worker to supposedly call  the facility.      However, each time calling a wrong number.  Finally, the social      worker called the number for him and the patient got on the phone      with the director of [facility] and began asking him questions      such as how he would get extra spending money while he was there      and if they would find him permanent housing while he was there.      He also proceeded to tell the director that he had been kicked out      of a similar treatment program in the past.  Following the      conversation of the patient with the director, the director got on      line with the social worker and told her that they  would no longer      be able to accept him.  The social worker did tell him that [facility]      is really his last option and they have a very long waiting list.      She did give him the number of [facility] and told him that if he      desires rehabilitation for his alcohol dependence that he may call      the number and get himself on the waiting list.  On the day of      discharge, the patient did accept this, take the number, and was      ready to leave.  2. Regarding suicidal ideations:  On Friday, December 29, 2006, when the      social worker was unable to find a program for him to go to, and      told him that he may be needing to leave over the weekend, the      patient did threaten to hurt himself if he was sent out with      nowhere to go.  In the late afternoon on Friday, we did consult Dr.      Jeanie Sewer of Psychiatry who did come to see him on Friday      afternoon, of which we were very appreciative.  He recommended the      patient for inpatient treatment for dual diagnosis.  However, the      patient preferred a residential program.  As stated above, we made      every attempt to get him into a residential program, however, in      the end he was a factor in keeping himself from being able to go.      Since he states that he does desire inpatient treatment, we  did      give him information to pursue placement with [facility] should he      want to in the future.  He, on the day of discharge, expressed no      more suicidal ideations nor any plan or intent to hurt himself.  He      was discharged in stable condition.   FOLLOWUP APPOINTMENTS:  The patient could follow up at [facility] for  his meds and any further medical follow up.      Ardeen Garland, MD  Electronically Signed      Leighton Roach McDiarmid, M.D.  Electronically Signed    LM/MEDQ  D:  01/01/2007  T:  01/01/2007  Job:  [id number]
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
## 2010-10-05 NOTE — Discharge Summary (Signed)
NAME:  Troy Mclaughlin, Troy Mclaughlin NO.:  [id number]   MEDICAL RECORD NO.:  [id number]          PATIENT TYPE:  INP   LOCATION:  5501                         FACILITY:  [facility]   PHYSICIAN:  Leighton Roach McDiarmid, M.D.DATE OF BIRTH:  Sep 01, 1955   DATE OF ADMISSION:  04/05/2008  DATE OF DISCHARGE:  04/08/2008                               DISCHARGE SUMMARY   PRIMARY CARE PHYSICIAN:  [name], MD, [facility]   DISCHARGE DIAGNOSES:  1. Left-sided abdominal pain.  2. Alcoholism.  3. Tobacco abuse.  4. Chest pain.  5. Hypertension.   DISCHARGE MEDICATIONS:  1. Colace 100 mg p.o. b.i.d.  2. Hydrochlorothiazide 25 mg p.o. daily.  3. Over-the-counter multivitamins  4. Ketorolac 10 mg p.o. q.6 h. as needed for pain.  This patient was      given 2 weeks supply worth of this prescription.   CONSULTATIONS:  No consults during this hospitalization.   PROCEDURES:  The patient had on April 05, 2008 an abdominal acute x-  ray, which showed mildly prominent proximal small bowel filled with oral  contrast material, cannot exclude a low-grade partial small bowel  obstruction.  The patient is scheduled for a CT of the abdomen and  pelvis.  He had no free air.  CT of the abdomen with contrast showed no  acute findings in the abdomen and the pelvis.  It showed no acute  findings in the anatomic pelvis.  On the date of admission on April 05, 2008, the patient had an alcohol level of 319.  His lipase was 19.  His UA was completely normal.  He had a urine drug screen that was  negative.  He had a fecal occult blood test that was negative and he had  a CBC that was completely normal, but he did have low neutrophils of 34%  and high lymphocytes at 55%.  On April 06, 2008, he had cardiac  enzymes which showed a creatinine kinase of 421 with a troponin I of  0.01.  On April 06, 2008, he had basic metabolic panel that showed a  low glucose of 36.  He also had a BUN  of 5 and a calcium of 8.  Otherwise, it was completely normal.  He had his last set of cardiac  enzymes on April 06, 2008 that showed a decreasing creatinine kinase  of 375 and a troponin I less than 0.01.   HOSPITAL COURSE:  1. This is a 55 year old male that comes in with a history of alcohol      abuse, previous abdominal surgeries, and multiple small bowel      obstruction who had about 2 days of abdominal pain, nausea, and      vomiting.  He reports that he could pass gas and he had a small      bowel movement the day of admission.  During the hospital course,      the patient continued to complain of left-sided abdominal pain      around the site of a prior colostomy.  The pain did improve  slightly with medication.  The patient did have some constipation,      was given magnesium citrate, and did have some stool.  On the day      of admission, the patient was at baseline.  The patient was not      considered to have a small bowel obstruction due to the CT scan      read.  The patient was sent home with a prescription for Colace 100      mg p.o. b.i.d. to help soften stool and decrease the abdominal pain      from constipation.  2. Alcoholism.  The patient was started on Ativan protocol.  The      patient was given folic acid, continued on multivitamins, advanced      on Ativan protocol, and on the day of discharge, the patient was in      good condition.  3. Tobacco abuse.  For the nicotine dependence, the patient was      continued on nicotine patch while he was in the hospital.  4. Chest pain.  The patient had cardiac enzymes x2 that were negative.      He has had multiple admissions for chest pain in the past.  This      patient did not complain of chest pain on the day of discharge.      Cardiovascularly, the patient has hypertension.  He was on no meds      for hypertension.  When he came into the hospital, he was put on      hydrochlorothiazide 25 mg.  The patient  was sent home with a      prescription for hydrochlorothiazide.  The patient is to have a low-      sodium diet.   DISPOSITION:  The patient was discharged to home in stable medical  condition.      Jamie Brookes, MD  Electronically Signed      Leighton Roach McDiarmid, M.D.  Electronically Signed    AS/MEDQ  D:  04/11/2008  T:  04/12/2008  Job:  [id number]   cc:   [name], MD
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
## 2010-10-08 NOTE — Discharge Summary (Signed)
NAME:  Troy Mclaughlin, Troy Mclaughlin                         ACCOUNT NO.:  [id number]   MEDICAL RECORD NO.:  [id number]                   PATIENT TYPE:  INP   LOCATION:  0375                                 FACILITY:  [facility]   PHYSICIAN:  Melissa L. Ladona Ridgel, MD               DATE OF BIRTH:  05/01/1956   DATE OF ADMISSION:  02/06/2003  DATE OF DISCHARGE:  02/11/2003                                 DISCHARGE SUMMARY   ADDENDUM:  As previously dictated, this is a 55 year old African-American  male who was admitted with a partial small-bowel obstruction and ethanol  withdrawal.  Please refer to the previous discharge summary for full  details.  On the day of planned discharge the patient developed several  episodes of vomiting and increased abdominal pain.  It was decided that his  condition had regressed, and his discharge to an outpatient program was  cancelled.  An obstruction series was obtained to determine whether the  patient had developed further signs or symptoms of small-bowel obstruction.  Review of his x-rays showed normal gas pattern but evidence for obstipation.  On February 11, 2003, the patient stated that his pain had resolved, that  he still had not moved his bowels but was willing to utilize a Dulcolax  suppository, which was provided.  He had had no further episodes of vomiting  and was able to eat a full diet without recurrence of pain or vomiting.  On  the day of discharge his vital signs were exam.  His revealed pupils were  equal, round, reactive to light.  Extraocular muscles were intact.  His  chest was clear to auscultation with decreased breath sounds bilaterally.  His heart was regular, with normal rate and rhythm.  His abdomen was thin,  soft, with trace tenderness in the left lower quadrant with deep palpation.  He had positive bowel sounds.  He had no lower extremity edema.  No  laboratory values were ordered at the time of discharge.  Case management  was able to  arrange for transportation to an intensive outpatient program in  the [facility] area for alcohol abuse.  The patient stated he was willing to  participate in intensive outpatient while seeking shelter with friends and  family until an inpatient bed would be available on approximately February 24, 2003.  At the time of discharge the patient was instructed that if possible  he should obtain an over-the-counter proton pump inhibitor which would  likely help to decrease his occasional abdominal pain.  He was instructed on  a heart-healthy diet and encouraged to seek follow-up medical care with  [facility].  He was given no medications at discharge, and case management  met with him to provide full information for his rehabilitative stay.  Melissa L. Ladona Ridgel, MD    MLT/MEDQ  D:  02/12/2003  T:  02/13/2003  Job:  [id number]
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
## 2010-12-25 ENCOUNTER — Emergency Department (HOSPITAL_COMMUNITY): Payer: Self-pay

## 2010-12-25 ENCOUNTER — Emergency Department (HOSPITAL_COMMUNITY)
Admission: EM | Admit: 2010-12-25 | Discharge: 2010-12-25 | Disposition: A | Discharge status: Home / Self Care | Payer: Self-pay | Attending: Emergency Medicine | Admitting: Emergency Medicine

## 2010-12-25 DIAGNOSIS — R109 Unspecified abdominal pain: Secondary | ICD-10-CM | POA: Insufficient documentation

## 2010-12-25 DIAGNOSIS — R443 Hallucinations, unspecified: Secondary | ICD-10-CM | POA: Insufficient documentation

## 2010-12-25 DIAGNOSIS — I10 Essential (primary) hypertension: Secondary | ICD-10-CM | POA: Insufficient documentation

## 2010-12-25 DIAGNOSIS — F1010 Alcohol abuse, uncomplicated: Secondary | ICD-10-CM | POA: Insufficient documentation

## 2010-12-25 LAB — URINALYSIS, ROUTINE W REFLEX MICROSCOPIC
Bilirubin Urine: NEGATIVE
Glucose, UA: NEGATIVE mg/dL
Hgb urine dipstick: NEGATIVE
Ketones, ur: NEGATIVE mg/dL
Leukocytes, UA: NEGATIVE
Protein, ur: NEGATIVE mg/dL

## 2010-12-25 LAB — DIFFERENTIAL
Lymphs Abs: 2.1 10*3/uL (ref 0.7–4.0)
Monocytes Relative: 6 % (ref 3–12)
Neutro Abs: 1.9 10*3/uL (ref 1.7–7.7)
Neutrophils Relative %: 43 % (ref 43–77)

## 2010-12-25 LAB — BASIC METABOLIC PANEL
CO2: 26 mEq/L (ref 19–32)
Calcium: 9 mg/dL (ref 8.4–10.5)
GFR calc non Af Amer: >60 mL/min (ref >60)
Glucose, Bld: 89 mg/dL (ref 70–99)
Potassium: 3.5 mEq/L (ref 3.5–5.1)
Sodium: 137 mEq/L (ref 135–145)

## 2010-12-25 LAB — CBC
HCT: 41.4 % (ref 39.0–52.0)
Hemoglobin: 14.4 g/dL (ref 13.0–17.0)
MCHC: 34.8 g/dL (ref 30.0–36.0)
RDW: 14.5 % (ref 11.5–15.5)
WBC: 4.5 10*3/uL (ref 4.0–10.5)

## 2010-12-25 LAB — RAPID URINE DRUG SCREEN, HOSP PERFORMED
Amphetamines: NOT DETECTED
Barbiturates: NOT DETECTED
Benzodiazepines: NOT DETECTED
Tetrahydrocannabinol: NOT DETECTED

## 2010-12-25 LAB — ETHANOL: Alcohol, Ethyl (B): 254 mg/dL — ABNORMAL HIGH (ref 0–11)

## 2010-12-27 ENCOUNTER — Emergency Department (HOSPITAL_COMMUNITY): Payer: Self-pay

## 2010-12-27 ENCOUNTER — Emergency Department (HOSPITAL_COMMUNITY)
Admission: EM | Admit: 2010-12-27 | Discharge: 2010-12-28 | Disposition: A | Discharge status: Other Acute Inpatient Hospital | Payer: Self-pay | Attending: Emergency Medicine | Admitting: Emergency Medicine

## 2010-12-27 DIAGNOSIS — F1010 Alcohol abuse, uncomplicated: Secondary | ICD-10-CM | POA: Insufficient documentation

## 2010-12-27 DIAGNOSIS — R109 Unspecified abdominal pain: Secondary | ICD-10-CM | POA: Insufficient documentation

## 2010-12-27 DIAGNOSIS — I10 Essential (primary) hypertension: Secondary | ICD-10-CM | POA: Insufficient documentation

## 2010-12-27 DIAGNOSIS — R1110 Vomiting, unspecified: Secondary | ICD-10-CM | POA: Insufficient documentation

## 2010-12-27 DIAGNOSIS — R569 Unspecified convulsions: Secondary | ICD-10-CM | POA: Insufficient documentation

## 2010-12-27 LAB — URINALYSIS, ROUTINE W REFLEX MICROSCOPIC
Ketones, ur: NEGATIVE mg/dL
Leukocytes, UA: NEGATIVE
Protein, ur: NEGATIVE mg/dL
Urobilinogen, UA: 0.2 mg/dL (ref 0.0–1.0)

## 2010-12-27 LAB — CBC
HCT: 42.4 % (ref 39.0–52.0)
MCHC: 36.6 g/dL — ABNORMAL HIGH (ref 30.0–36.0)
MCV: 71.4 fL — ABNORMAL LOW (ref 78.0–100.0)
Platelets: 263 10*3/uL (ref 150–400)
RDW: 13.9 % (ref 11.5–15.5)
WBC: 5.2 10*3/uL (ref 4.0–10.5)

## 2010-12-27 LAB — DIFFERENTIAL
Eosinophils Absolute: 0.1 10*3/uL (ref 0.0–0.7)
Lymphs Abs: 1.6 10*3/uL (ref 0.7–4.0)
Monocytes Absolute: 0.4 10*3/uL (ref 0.1–1.0)
Neutrophils Relative %: 60 % (ref 43–77)

## 2010-12-27 LAB — RAPID URINE DRUG SCREEN, HOSP PERFORMED
Barbiturates: NOT DETECTED
Benzodiazepines: NOT DETECTED
Cocaine: NOT DETECTED

## 2010-12-27 LAB — COMPREHENSIVE METABOLIC PANEL
ALT: 19 U/L (ref 0–53)
AST: 31 U/L (ref 0–37)
CO2: 28 mEq/L (ref 19–32)
Calcium: 9.4 mg/dL (ref 8.4–10.5)
Chloride: 96 mEq/L (ref 96–112)
GFR calc Af Amer: >60 mL/min (ref >60)
GFR calc non Af Amer: >60 mL/min (ref >60)
Glucose, Bld: 90 mg/dL (ref 70–99)
Sodium: 135 mEq/L (ref 135–145)
Total Bilirubin: 0.2 mg/dL — ABNORMAL LOW (ref 0.3–1.2)

## 2010-12-28 ENCOUNTER — Inpatient Hospital Stay (HOSPITAL_COMMUNITY)
Admission: AD | Admit: 2010-12-28 | Discharge: 2011-01-01 | DRG: 897 | Disposition: A | Discharge status: Home / Self Care | Payer: PRIVATE HEALTH INSURANCE | Source: Ambulatory Visit | Attending: Psychiatry | Admitting: Psychiatry

## 2010-12-28 DIAGNOSIS — H409 Unspecified glaucoma: Secondary | ICD-10-CM

## 2010-12-28 DIAGNOSIS — F329 Major depressive disorder, single episode, unspecified: Secondary | ICD-10-CM

## 2010-12-28 DIAGNOSIS — K2920 Alcoholic gastritis without bleeding: Secondary | ICD-10-CM

## 2010-12-28 DIAGNOSIS — K5900 Constipation, unspecified: Secondary | ICD-10-CM

## 2010-12-28 DIAGNOSIS — F1020 Alcohol dependence, uncomplicated: Principal | ICD-10-CM

## 2010-12-28 DIAGNOSIS — I10 Essential (primary) hypertension: Secondary | ICD-10-CM

## 2010-12-28 DIAGNOSIS — G40909 Epilepsy, unspecified, not intractable, without status epilepticus: Secondary | ICD-10-CM

## 2010-12-28 DIAGNOSIS — F411 Generalized anxiety disorder: Secondary | ICD-10-CM

## 2010-12-28 DIAGNOSIS — F3289 Other specified depressive episodes: Secondary | ICD-10-CM

## 2010-12-29 DIAGNOSIS — F1020 Alcohol dependence, uncomplicated: Secondary | ICD-10-CM
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
## 2011-01-01 ENCOUNTER — Emergency Department (HOSPITAL_COMMUNITY)
Admission: EM | Admit: 2011-01-01 | Discharge: 2011-01-01 | Disposition: A | Discharge status: Home / Self Care | Payer: Self-pay | Attending: Emergency Medicine | Admitting: Emergency Medicine

## 2011-01-01 ENCOUNTER — Emergency Department (HOSPITAL_COMMUNITY): Payer: Self-pay

## 2011-01-01 DIAGNOSIS — R109 Unspecified abdominal pain: Secondary | ICD-10-CM | POA: Insufficient documentation

## 2011-01-01 DIAGNOSIS — R112 Nausea with vomiting, unspecified: Secondary | ICD-10-CM | POA: Insufficient documentation

## 2011-01-01 DIAGNOSIS — Z9889 Other specified postprocedural states: Secondary | ICD-10-CM | POA: Insufficient documentation

## 2011-01-01 DIAGNOSIS — I10 Essential (primary) hypertension: Secondary | ICD-10-CM | POA: Insufficient documentation

## 2011-01-01 DIAGNOSIS — F3289 Other specified depressive episodes: Secondary | ICD-10-CM | POA: Insufficient documentation

## 2011-01-01 DIAGNOSIS — G40909 Epilepsy, unspecified, not intractable, without status epilepticus: Secondary | ICD-10-CM | POA: Insufficient documentation

## 2011-01-01 DIAGNOSIS — F329 Major depressive disorder, single episode, unspecified: Secondary | ICD-10-CM | POA: Insufficient documentation

## 2011-01-01 DIAGNOSIS — F411 Generalized anxiety disorder: Secondary | ICD-10-CM | POA: Insufficient documentation

## 2011-01-01 DIAGNOSIS — K5900 Constipation, unspecified: Secondary | ICD-10-CM | POA: Insufficient documentation

## 2011-01-01 LAB — RAPID URINE DRUG SCREEN, HOSP PERFORMED
Benzodiazepines: POSITIVE — AB
Opiates: NOT DETECTED

## 2011-01-01 LAB — COMPREHENSIVE METABOLIC PANEL
ALT: 18 U/L (ref 0–53)
AST: 21 U/L (ref 0–37)
CO2: 30 mEq/L (ref 19–32)
Calcium: 9.5 mg/dL (ref 8.4–10.5)
Creatinine, Ser: 0.99 mg/dL (ref 0.50–1.35)
GFR calc non Af Amer: >60 mL/min (ref >60)
Sodium: 141 mEq/L (ref 135–145)
Total Protein: 7.4 g/dL (ref 6.0–8.3)

## 2011-01-01 LAB — DIFFERENTIAL
Basophils Relative: 0 % (ref 0–1)
Eosinophils Absolute: 0.2 10*3/uL (ref 0.0–0.7)
Eosinophils Relative: 1 % (ref 0–5)
Lymphs Abs: 1.1 10*3/uL (ref 0.7–4.0)
Monocytes Absolute: 0.8 10*3/uL (ref 0.1–1.0)
Monocytes Relative: 7 % (ref 3–12)
Neutrophils Relative %: 81 % — ABNORMAL HIGH (ref 43–77)

## 2011-01-01 LAB — LIPASE, BLOOD: Lipase: 36 U/L (ref 11–59)

## 2011-01-01 LAB — CBC
MCH: 25.9 pg — ABNORMAL LOW (ref 26.0–34.0)
MCHC: 35.4 g/dL (ref 30.0–36.0)
MCV: 73.1 fL — ABNORMAL LOW (ref 78.0–100.0)
Platelets: 201 10*3/uL (ref 150–400)
RDW: 14.2 % (ref 11.5–15.5)

## 2011-01-01 LAB — URINALYSIS, ROUTINE W REFLEX MICROSCOPIC
Ketones, ur: NEGATIVE mg/dL
Leukocytes, UA: NEGATIVE
Nitrite: NEGATIVE
Protein, ur: NEGATIVE mg/dL
pH: 5 (ref 5.0–8.0)
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
## 2011-02-09 LAB — CARDIAC PANEL(CRET KIN+CKTOT+MB+TROPI)
Relative Index: 0.4
Total CK: 390 — ABNORMAL HIGH
Total CK: 432 — ABNORMAL HIGH
Troponin I: 0.01
Troponin I: 0.01
Troponin I: 0.01

## 2011-02-09 LAB — BASIC METABOLIC PANEL WITH GFR
BUN: 12
BUN: 5 — ABNORMAL LOW
BUN: <1 — ABNORMAL LOW
CO2: 23
CO2: 24
CO2: 27
Calcium: 8.6
Calcium: 8.6
Calcium: 8.9
Chloride: 106
Chloride: 106
Chloride: 107
Creatinine, Ser: 0.74
Creatinine, Ser: 0.82
Creatinine, Ser: 0.89
GFR calc non Af Amer: >60
GFR calc non Af Amer: >60
GFR calc non Af Amer: >60
Glucose, Bld: 72
Glucose, Bld: 82
Glucose, Bld: 83
Potassium: 4.1
Potassium: 4.1
Potassium: 5.2 — ABNORMAL HIGH
Sodium: 135
Sodium: 136
Sodium: 138

## 2011-02-09 LAB — RPR: RPR Ser Ql: REACTIVE — AB

## 2011-02-09 LAB — CBC
HCT: 38.5 — ABNORMAL LOW
HCT: 40
HCT: 41.3
Hemoglobin: 12.7 — ABNORMAL LOW
MCHC: 32.6
MCHC: 32.6
MCHC: 33.1
MCV: 75.5 — ABNORMAL LOW
MCV: 77 — ABNORMAL LOW
MCV: 77.6 — ABNORMAL LOW
MCV: 78.6
Platelets: 179
Platelets: 260
RBC: 4.99
RBC: 5.1
RDW: 14.4
RDW: 14.5
RDW: 14.9
WBC: 3.9 — ABNORMAL LOW
WBC: 5.4

## 2011-02-09 LAB — POCT CARDIAC MARKERS
CKMB, poc: 1.4
Myoglobin, poc: 145
Operator id: 272551
Troponin i, poc: <0.05

## 2011-02-09 LAB — BASIC METABOLIC PANEL
BUN: 9
Calcium: 8.5
Calcium: 9.1
Creatinine, Ser: 0.95
GFR calc Af Amer: >60
GFR calc non Af Amer: >60
GFR calc non Af Amer: >60
Glucose, Bld: 81
Sodium: 137

## 2011-02-09 LAB — I-STAT 8, (EC8 V) (CONVERTED LAB)
BUN: 8
Bicarbonate: 30 — ABNORMAL HIGH
Glucose, Bld: 86
Hemoglobin: 17.7 — ABNORMAL HIGH
Sodium: 141
TCO2: 31
pCO2, Ven: 49.5

## 2011-02-09 LAB — DIFFERENTIAL
Basophils Absolute: 0
Basophils Relative: 0
Eosinophils Absolute: 0
Monocytes Absolute: 0.3
Monocytes Relative: 5
Neutro Abs: 2.4
Neutrophils Relative %: 47

## 2011-02-09 LAB — T.PALLIDUM AB, IGG: T pallidum Antibodies (TP-PA): >8 — ABNORMAL HIGH

## 2011-02-09 LAB — POCT I-STAT CREATININE: Creatinine, Ser: 1

## 2011-02-09 LAB — LIPID PANEL
HDL: 84
Total CHOL/HDL Ratio: 2
Triglycerides: 89
VLDL: 18

## 2011-02-09 LAB — GC/CHLAMYDIA PROBE AMP, URINE
Chlamydia, Swab/Urine, PCR: NEGATIVE
GC Probe Amp, Urine: NEGATIVE

## 2011-02-09 LAB — TSH: TSH: 0.744

## 2011-02-11 LAB — ETHANOL: Alcohol, Ethyl (B): 174 — ABNORMAL HIGH

## 2011-02-11 LAB — CK TOTAL AND CKMB (NOT AT ARMC): CK, MB: 1.9

## 2011-02-14 LAB — DIFFERENTIAL
Basophils Absolute: 0
Basophils Relative: 1
Monocytes Relative: 10
Neutro Abs: 2.3
Neutrophils Relative %: 59

## 2011-02-14 LAB — BASIC METABOLIC PANEL
BUN: 2 — ABNORMAL LOW
Chloride: 105
Chloride: 110
Creatinine, Ser: 0.87
GFR calc Af Amer: >60
GFR calc non Af Amer: >60
Potassium: 3.6
Potassium: 3.7
Sodium: 140

## 2011-02-14 LAB — POCT I-STAT, CHEM 8
BUN: 11
Chloride: 102
Sodium: 136

## 2011-02-14 LAB — CBC
HCT: 38.4 — ABNORMAL LOW
HCT: 44.3
Hemoglobin: 12.7 — ABNORMAL LOW
MCV: 77.5 — ABNORMAL LOW
MCV: 78.5
MCV: 78.5
Platelets: 186
RBC: 4.86
RBC: 4.9
RBC: 5.72
WBC: 3.7 — ABNORMAL LOW
WBC: 4
WBC: 4.5

## 2011-02-14 LAB — LIPASE, BLOOD: Lipase: 14

## 2011-02-14 LAB — TSH: TSH: 0.507

## 2011-02-14 LAB — COMPREHENSIVE METABOLIC PANEL
BUN: 9
CO2: 25
Chloride: 102
Creatinine, Ser: 0.98
GFR calc non Af Amer: >60
Glucose, Bld: 86
Total Bilirubin: 0.8

## 2011-02-14 LAB — URINALYSIS, ROUTINE W REFLEX MICROSCOPIC
Nitrite: NEGATIVE
Specific Gravity, Urine: 1.028
Urobilinogen, UA: 2 — ABNORMAL HIGH

## 2011-02-14 LAB — URINE MICROSCOPIC-ADD ON

## 2011-02-14 LAB — OCCULT BLOOD X 1 CARD TO LAB, STOOL: Fecal Occult Bld: NEGATIVE

## 2011-02-15 LAB — BASIC METABOLIC PANEL
BUN: 1 — ABNORMAL LOW
BUN: 1 — ABNORMAL LOW
BUN: 10
CO2: 22
CO2: 24
CO2: 26
CO2: 30
Calcium: 8.3 — ABNORMAL LOW
Calcium: 8.5
Calcium: 8.5
Chloride: 108
Chloride: 109
Chloride: 109
Creatinine, Ser: 0.81
Creatinine, Ser: 0.85
Creatinine, Ser: 0.88
Creatinine, Ser: 0.96
GFR calc Af Amer: >60
GFR calc non Af Amer: >60
Glucose, Bld: 88
Glucose, Bld: 95
Glucose, Bld: 98
Sodium: 138

## 2011-02-15 LAB — DIFFERENTIAL
Basophils Relative: 0
Eosinophils Relative: 2
Myelocytes: 0
Neutrophils Relative %: 49

## 2011-02-15 LAB — HIV ANTIBODY (ROUTINE TESTING W REFLEX): HIV: NONREACTIVE

## 2011-02-15 LAB — RPR: RPR Ser Ql: REACTIVE — AB

## 2011-02-15 LAB — COMPREHENSIVE METABOLIC PANEL
ALT: 24
AST: 48 — ABNORMAL HIGH
Calcium: 8.8
GFR calc Af Amer: >60
Glucose, Bld: 70
Sodium: 138
Total Protein: 7.1

## 2011-02-15 LAB — RPR TITER: RPR Titer: 1:4 {titer} — AB

## 2011-02-15 LAB — CBC
HCT: 37.5 — ABNORMAL LOW
HCT: 38.5 — ABNORMAL LOW
Hemoglobin: 12.4 — ABNORMAL LOW
MCHC: 32.9
MCHC: 33.2
MCHC: 33.3
MCHC: 34.3
MCV: 78.8
MCV: 79.1
MCV: 79.1
Platelets: 160
Platelets: 174
Platelets: 341
RBC: 4.9
RDW: 15.4
RDW: 15.5
RDW: 15.8 — ABNORMAL HIGH
RDW: 15.2
WBC: 4
WBC: 5.2

## 2011-02-15 LAB — RAPID URINE DRUG SCREEN, HOSP PERFORMED
Amphetamines: NOT DETECTED
Benzodiazepines: NOT DETECTED
Cocaine: NOT DETECTED
Opiates: NOT DETECTED
Tetrahydrocannabinol: NOT DETECTED

## 2011-02-15 LAB — URINALYSIS, ROUTINE W REFLEX MICROSCOPIC
Bilirubin Urine: NEGATIVE
Hgb urine dipstick: NEGATIVE
Specific Gravity, Urine: 1.009
pH: 6.5

## 2011-02-17 LAB — BASIC METABOLIC PANEL
BUN: 9
Chloride: 100
Glucose, Bld: 89
Potassium: 4.8
Sodium: 136

## 2011-02-17 LAB — HEPATIC FUNCTION PANEL
ALT: 11
ALT: 22
Alkaline Phosphatase: 91
Bilirubin, Direct: 0.7 — ABNORMAL HIGH
Indirect Bilirubin: 0.9
Indirect Bilirubin: 0.6
Total Bilirubin: 1.3 — ABNORMAL HIGH
Total Protein: 7.8

## 2011-02-17 LAB — CBC
HCT: 51
HCT: 42.6
Hemoglobin: 16.6
Hemoglobin: 13.9
MCHC: 32.6
MCV: 77.5 — ABNORMAL LOW
MCV: 78.1
Platelets: 214
RDW: 14.1
RDW: 14.2
WBC: 4.2

## 2011-02-17 LAB — POCT I-STAT, CHEM 8
BUN: 17
Calcium, Ion: 1.06 — ABNORMAL LOW
Creatinine, Ser: 1.2
Glucose, Bld: 94
TCO2: 29

## 2011-02-17 LAB — DIFFERENTIAL
Band Neutrophils: 0
Blasts: 0
Lymphocytes Relative: 50 — ABNORMAL HIGH
Metamyelocytes Relative: 0
Myelocytes: 0
Promyelocytes Absolute: 0

## 2011-02-22 LAB — COMPREHENSIVE METABOLIC PANEL
ALT: 13
ALT: 14
AST: 17
AST: 26
Albumin: 3.8
Albumin: 3.8
Alkaline Phosphatase: 74
Alkaline Phosphatase: 81
BUN: 5 — ABNORMAL LOW
Calcium: 8.7
Chloride: 107
GFR calc Af Amer: >60
GFR calc Af Amer: >60
Potassium: 4.3
Potassium: 4.4
Sodium: 138
Sodium: 141
Total Bilirubin: 0.3
Total Protein: 7
Total Protein: 7.3

## 2011-02-22 LAB — POCT CARDIAC MARKERS
CKMB, poc: <1 — ABNORMAL LOW
CKMB, poc: <1 — ABNORMAL LOW
Myoglobin, poc: 53.4
Myoglobin, poc: 53.7
Troponin i, poc: <0.05
Troponin i, poc: <0.05
Troponin i, poc: <0.05

## 2011-02-22 LAB — URINALYSIS, ROUTINE W REFLEX MICROSCOPIC
Glucose, UA: NEGATIVE
Hgb urine dipstick: NEGATIVE
Ketones, ur: NEGATIVE
Nitrite: NEGATIVE
Protein, ur: NEGATIVE
Protein, ur: NEGATIVE
Specific Gravity, Urine: 1.015
Urobilinogen, UA: 0.2
Urobilinogen, UA: 0.2

## 2011-02-22 LAB — CBC
HCT: 42.9
Hemoglobin: 15.2
MCHC: 32.2
Platelets: 228
RDW: 14.9
RDW: 15.1
WBC: 4.9

## 2011-02-22 LAB — PROTIME-INR: Prothrombin Time: 12.5

## 2011-02-22 LAB — CK TOTAL AND CKMB (NOT AT ARMC)
CK, MB: 3.1
Total CK: 421 — ABNORMAL HIGH

## 2011-02-22 LAB — RAPID URINE DRUG SCREEN, HOSP PERFORMED
Amphetamines: NOT DETECTED
Barbiturates: NOT DETECTED
Tetrahydrocannabinol: NOT DETECTED

## 2011-02-22 LAB — DIFFERENTIAL
Band Neutrophils: 0
Basophils Absolute: 0
Basophils Relative: 0
Basophils Relative: 0
Blasts: 0
Eosinophils Absolute: 0.1
Eosinophils Relative: 3
Lymphocytes Relative: 55 — ABNORMAL HIGH
Lymphs Abs: 2.3
Lymphs Abs: 2.7
Metamyelocytes Relative: 0
Monocytes Absolute: 0.3
Monocytes Relative: 6
Myelocytes: 0
Promyelocytes Absolute: 0

## 2011-02-22 LAB — BASIC METABOLIC PANEL
CO2: 22
Calcium: 8 — ABNORMAL LOW
Chloride: 109
Creatinine, Ser: 0.74
GFR calc Af Amer: >60
Glucose, Bld: 36 — CL

## 2011-02-22 LAB — LIPASE, BLOOD: Lipase: 19

## 2011-02-22 LAB — CARDIAC PANEL(CRET KIN+CKTOT+MB+TROPI)
CK, MB: 2.4
Relative Index: 0.6
Troponin I: <0.01

## 2011-02-22 LAB — ETHANOL
Alcohol, Ethyl (B): 254 — ABNORMAL HIGH
Alcohol, Ethyl (B): 319 — ABNORMAL HIGH

## 2011-02-22 LAB — GLUCOSE, CAPILLARY: Glucose-Capillary: 62 — ABNORMAL LOW

## 2011-02-22 LAB — HEPATIC FUNCTION PANEL
Albumin: 3.7 g/dL (ref 3.5–5.2)
Alkaline Phosphatase: 80 U/L (ref 39–117)
Indirect Bilirubin: 0.3 mg/dL (ref 0.3–0.9)
Total Bilirubin: 0.4 mg/dL (ref 0.3–1.2)

## 2011-02-25 LAB — I-STAT 8, (EC8 V) (CONVERTED LAB)
BUN: 8
Bicarbonate: 24.2 — ABNORMAL HIGH
Glucose, Bld: 63 — ABNORMAL LOW
HCT: 53 — ABNORMAL HIGH
Hemoglobin: 18 — ABNORMAL HIGH
Operator id: 295021
Sodium: 136
TCO2: 25
pCO2, Ven: 28.7 — ABNORMAL LOW

## 2011-02-25 LAB — CARDIAC PANEL(CRET KIN+CKTOT+MB+TROPI)
CK, MB: 1.1
CK, MB: 1.2
Relative Index: 0.4
Relative Index: 0.5
Total CK: 232
Troponin I: 0.02

## 2011-02-25 LAB — RAPID URINE DRUG SCREEN, HOSP PERFORMED
Amphetamines: NOT DETECTED
Barbiturates: NOT DETECTED
Opiates: NOT DETECTED

## 2011-02-25 LAB — COMPREHENSIVE METABOLIC PANEL
ALT: 17 U/L (ref 0–53)
AST: 33 U/L (ref 0–37)
Albumin: 4
BUN: 8
CO2: 30 mEq/L (ref 19–32)
Calcium: 9.7 mg/dL (ref 8.4–10.5)
Calcium: 9.4
Creatinine, Ser: 0.95
GFR calc Af Amer: >60 mL/min (ref >60)
Potassium: 4.2 mEq/L (ref 3.5–5.1)
Sodium: 141 mEq/L (ref 135–145)
Total Bilirubin: 1.5 — ABNORMAL HIGH
Total Protein: 6.5 g/dL (ref 6.0–8.3)
Total Protein: 7.5

## 2011-02-25 LAB — CBC
HCT: 40.9
HCT: 41.8
HCT: 44.2
HCT: 50
Hemoglobin: 13.3
Hemoglobin: 13.8
Hemoglobin: 14
Hemoglobin: 14.1
MCHC: 32.2 g/dL (ref 30.0–36.0)
MCHC: 32.2
MCHC: 33
MCV: 76.3 — ABNORMAL LOW
MCV: 76.8 — ABNORMAL LOW
MCV: 77.3 — ABNORMAL LOW
Platelets: 224
Platelets: 270
Platelets: 280
RBC: 5.39 MIL/uL (ref 4.22–5.81)
RBC: 5.41
RBC: 5.43
RBC: 6.17 — ABNORMAL HIGH
RDW: 14.8 % (ref 11.5–15.5)
RDW: 14.1
RDW: 14.6
RDW: 14.6
RDW: 14.8
RDW: 14.8
RDW: 14.9
WBC: 3.5 — ABNORMAL LOW
WBC: 4
WBC: 4.1
WBC: 4.9

## 2011-02-25 LAB — BASIC METABOLIC PANEL
Calcium: 8.9
Calcium: 9.1
Chloride: 103
Creatinine, Ser: 0.88
GFR calc Af Amer: >60
GFR calc Af Amer: >60
GFR calc non Af Amer: >60
GFR calc non Af Amer: >60
GFR calc non Af Amer: >60
Glucose, Bld: 101 — ABNORMAL HIGH
Glucose, Bld: 123 — ABNORMAL HIGH
Potassium: 3.3 — ABNORMAL LOW
Sodium: 129 — ABNORMAL LOW
Sodium: 132 — ABNORMAL LOW
Sodium: 136

## 2011-02-25 LAB — DRUGS OF ABUSE SCREEN W/O ALC, ROUTINE URINE
Barbiturate Quant, Ur: NEGATIVE
Benzodiazepines.: NEGATIVE
Cocaine Metabolites: NEGATIVE
Phencyclidine (PCP): NEGATIVE

## 2011-02-25 LAB — URINALYSIS, ROUTINE W REFLEX MICROSCOPIC
Hgb urine dipstick: NEGATIVE
Nitrite: NEGATIVE
Protein, ur: NEGATIVE
Specific Gravity, Urine: 1.024
Urobilinogen, UA: 1

## 2011-02-25 LAB — HEPATIC FUNCTION PANEL
ALT: 24
AST: 51 — ABNORMAL HIGH
Albumin: 3.9

## 2011-02-25 LAB — DIFFERENTIAL
Basophils Absolute: 0
Basophils Absolute: 0.1
Basophils Relative: 1
Basophils Relative: 2 — ABNORMAL HIGH
Eosinophils Absolute: 0
Lymphocytes Relative: 29
Monocytes Relative: 10
Monocytes Relative: 8
Neutro Abs: 1.8
Neutro Abs: 2.4
Neutrophils Relative %: 45

## 2011-02-25 LAB — OPIATE, QUANTITATIVE, URINE
Codeine Urine: NEGATIVE ng/mL
Hydrocodone: NEGATIVE ng/mL
Morphine, Confirm: 2590 ng/mL
Oxycodone, ur: NEGATIVE ng/mL
Oxymorphone: NEGATIVE ng/mL

## 2011-02-25 LAB — POCT I-STAT CREATININE: Creatinine, Ser: 1.1

## 2011-02-25 LAB — POCT CARDIAC MARKERS
CKMB, poc: <1 — ABNORMAL LOW
Myoglobin, poc: 38
Myoglobin, poc: 39.2
Operator id: 295021
Operator id: 295021
Troponin i, poc: <0.05

## 2011-02-25 LAB — SEDIMENTATION RATE: Sed Rate: 3

## 2011-02-25 LAB — POTASSIUM: Potassium: 4.4

## 2011-02-25 LAB — ETHANOL
Alcohol, Ethyl (B): 57 — ABNORMAL HIGH
Alcohol, Ethyl (B): <5

## 2011-03-01 LAB — COMPREHENSIVE METABOLIC PANEL
CO2: 25
Calcium: 9.3
Creatinine, Ser: 0.82
GFR calc non Af Amer: >60
Glucose, Bld: 81

## 2011-03-01 LAB — CBC
Hemoglobin: 15.9
MCHC: 32.9
MCV: 76.7 — ABNORMAL LOW
RBC: 6.3 — ABNORMAL HIGH

## 2011-03-01 LAB — URINE MICROSCOPIC-ADD ON

## 2011-03-01 LAB — DIFFERENTIAL
Lymphocytes Relative: 30
Lymphs Abs: 2
Neutrophils Relative %: 55

## 2011-03-01 LAB — URINALYSIS, ROUTINE W REFLEX MICROSCOPIC
Ketones, ur: 15 — AB
Leukocytes, UA: NEGATIVE
Nitrite: NEGATIVE
Specific Gravity, Urine: 1.015
pH: 6

## 2011-03-01 LAB — LIPASE, BLOOD: Lipase: 21

## 2011-03-04 LAB — BASIC METABOLIC PANEL
Calcium: 8.4
GFR calc non Af Amer: >60
Potassium: 3.7
Sodium: 135

## 2011-03-04 LAB — URINALYSIS, ROUTINE W REFLEX MICROSCOPIC
Glucose, UA: NEGATIVE
Hgb urine dipstick: NEGATIVE
Specific Gravity, Urine: 1.028
Urobilinogen, UA: 1

## 2011-03-04 LAB — CBC
HCT: 39.2
HCT: 42.6
HCT: 46.6
Hemoglobin: 13
Hemoglobin: 14.1
Hemoglobin: 15.6
MCHC: 33.1
MCV: 75.6 — ABNORMAL LOW
Platelets: 205
Platelets: 241
RBC: 5.55
RBC: 6.16 — ABNORMAL HIGH
RDW: 15.6 — ABNORMAL HIGH
WBC: 3.6 — ABNORMAL LOW
WBC: 5.5

## 2011-03-04 LAB — PHOSPHORUS: Phosphorus: 3.5

## 2011-03-04 LAB — COMPREHENSIVE METABOLIC PANEL
ALT: 16
Alkaline Phosphatase: 60
Alkaline Phosphatase: 80
BUN: 10
BUN: 3 — ABNORMAL LOW
CO2: 22
CO2: 27
Chloride: 103
Creatinine, Ser: 0.98
GFR calc non Af Amer: >60
GFR calc non Af Amer: >60
Glucose, Bld: 62 — ABNORMAL LOW
Glucose, Bld: 89
Potassium: 3.9
Sodium: 138
Total Bilirubin: 1.3 — ABNORMAL HIGH
Total Protein: 6

## 2011-03-04 LAB — DIFFERENTIAL
Basophils Absolute: 0
Basophils Relative: 1
Lymphocytes Relative: 22
Neutro Abs: 3.8
Neutrophils Relative %: 70

## 2011-03-04 LAB — MAGNESIUM: Magnesium: 1.8

## 2011-03-04 LAB — LIPASE, BLOOD: Lipase: 14

## 2011-03-07 LAB — CBC
HCT: 40.4
HCT: 40.7
Hemoglobin: 12.8 — ABNORMAL LOW
Hemoglobin: 14.8
MCHC: 32.7
MCHC: 32.8
MCHC: 32.8
MCV: 76.8 — ABNORMAL LOW
MCV: 77.7 — ABNORMAL LOW
Platelets: 255
Platelets: 258
RBC: 5.01
RBC: 5.2
RBC: 5.29
RDW: 14.9 — ABNORMAL HIGH
WBC: 3.4 — ABNORMAL LOW
WBC: 3.7 — ABNORMAL LOW
WBC: 3.9 — ABNORMAL LOW

## 2011-03-07 LAB — BASIC METABOLIC PANEL
BUN: 14
BUN: 4 — ABNORMAL LOW
BUN: 5 — ABNORMAL LOW
CO2: 25
CO2: 26
CO2: 26
Calcium: 8.5
Calcium: 9.5
Chloride: 107
Chloride: 109
Creatinine, Ser: 0.84
Creatinine, Ser: 1
Creatinine, Ser: 1.17
GFR calc Af Amer: >60
GFR calc Af Amer: >60
GFR calc non Af Amer: >60
GFR calc non Af Amer: >60
Glucose, Bld: 76
Glucose, Bld: 84
Potassium: 3.5
Potassium: 3.9
Sodium: 133 — ABNORMAL LOW
Sodium: 141

## 2011-03-07 LAB — HEPATIC FUNCTION PANEL
ALT: 23
AST: 36
Alkaline Phosphatase: 86
Bilirubin, Direct: <0.1
Total Bilirubin: 0.9

## 2011-03-07 LAB — POCT CARDIAC MARKERS
CKMB, poc: <1 — ABNORMAL LOW
Myoglobin, poc: 56.5
Troponin i, poc: <0.05

## 2011-03-07 LAB — DIFFERENTIAL
Basophils Absolute: 0
Eosinophils Relative: 1
Lymphocytes Relative: 36
Monocytes Absolute: 0.4
Monocytes Relative: 12 — ABNORMAL HIGH
Neutro Abs: 1.9

## 2011-03-07 LAB — OCCULT BLOOD X 1 CARD TO LAB, STOOL: Fecal Occult Bld: NEGATIVE

## 2011-03-07 LAB — AMMONIA: Ammonia: 73 — ABNORMAL HIGH

## 2011-12-26 ENCOUNTER — Encounter (HOSPITAL_COMMUNITY): Payer: Self-pay | Admitting: *Deleted

## 2011-12-26 ENCOUNTER — Emergency Department (HOSPITAL_COMMUNITY)
Admission: EM | Admit: 2011-12-26 | Discharge: 2011-12-27 | Disposition: A | Discharge status: Home / Self Care | Payer: Self-pay | Attending: Emergency Medicine | Admitting: Emergency Medicine

## 2011-12-26 DIAGNOSIS — Z0389 Encounter for observation for other suspected diseases and conditions ruled out: Secondary | ICD-10-CM | POA: Insufficient documentation

## 2011-12-26 HISTORY — DX: Essential (primary) hypertension: I10

## 2011-12-26 HISTORY — DX: Cerebral infarction, unspecified: I63.9

## 2011-12-26 LAB — TYPE AND SCREEN
ABO/RH(D): O POS
Antibody Screen: NEGATIVE

## 2011-12-26 LAB — CBC WITH DIFFERENTIAL/PLATELET
Basophils Absolute: 0 10*3/uL (ref 0.0–0.1)
Eosinophils Relative: 2 % (ref 0–5)
HCT: 40.6 % (ref 39.0–52.0)
Lymphocytes Relative: 52 % — ABNORMAL HIGH (ref 12–46)
MCH: 25.1 pg — ABNORMAL LOW (ref 26.0–34.0)
MCHC: 34.7 g/dL (ref 30.0–36.0)
MCV: 72.4 fL — ABNORMAL LOW (ref 78.0–100.0)
Monocytes Absolute: 0.2 10*3/uL (ref 0.1–1.0)
RDW: 15.5 % (ref 11.5–15.5)
WBC: 3.6 10*3/uL — ABNORMAL LOW (ref 4.0–10.5)

## 2011-12-26 LAB — COMPREHENSIVE METABOLIC PANEL
ALT: 18 U/L (ref 0–53)
AST: 27 U/L (ref 0–37)
Albumin: 4.1 g/dL (ref 3.5–5.2)
Alkaline Phosphatase: 86 U/L (ref 39–117)
Potassium: 3.8 mEq/L (ref 3.5–5.1)
Sodium: 140 mEq/L (ref 135–145)
Total Protein: 8 g/dL (ref 6.0–8.3)

## 2011-12-26 NOTE — ED Notes (Signed)
Patient left AMA.

## 2011-12-26 NOTE — ED Notes (Signed)
Pt is here with abdominal pain and vomiting up bright red blood and reports black outs.  Pt drinks occasional.  Pt reports vomiting blood for 3 days

## 2012-06-11 ENCOUNTER — Emergency Department (HOSPITAL_COMMUNITY)
Admission: EM | Admit: 2012-06-11 | Discharge: 2012-06-11 | Disposition: A | Discharge status: Home / Self Care | Payer: Self-pay | Attending: Emergency Medicine | Admitting: Emergency Medicine

## 2012-06-11 ENCOUNTER — Encounter (HOSPITAL_COMMUNITY): Payer: Self-pay | Admitting: *Deleted

## 2012-06-11 DIAGNOSIS — I2510 Atherosclerotic heart disease of native coronary artery without angina pectoris: Secondary | ICD-10-CM | POA: Insufficient documentation

## 2012-06-11 DIAGNOSIS — R5381 Other malaise: Secondary | ICD-10-CM | POA: Insufficient documentation

## 2012-06-11 DIAGNOSIS — R110 Nausea: Secondary | ICD-10-CM | POA: Insufficient documentation

## 2012-06-11 DIAGNOSIS — R51 Headache: Secondary | ICD-10-CM | POA: Insufficient documentation

## 2012-06-11 DIAGNOSIS — J3489 Other specified disorders of nose and nasal sinuses: Secondary | ICD-10-CM | POA: Insufficient documentation

## 2012-06-11 DIAGNOSIS — F17200 Nicotine dependence, unspecified, uncomplicated: Secondary | ICD-10-CM | POA: Insufficient documentation

## 2012-06-11 DIAGNOSIS — R509 Fever, unspecified: Secondary | ICD-10-CM | POA: Insufficient documentation

## 2012-06-11 DIAGNOSIS — R0982 Postnasal drip: Secondary | ICD-10-CM | POA: Insufficient documentation

## 2012-06-11 DIAGNOSIS — E119 Type 2 diabetes mellitus without complications: Secondary | ICD-10-CM | POA: Insufficient documentation

## 2012-06-11 DIAGNOSIS — R5383 Other fatigue: Secondary | ICD-10-CM | POA: Insufficient documentation

## 2012-06-11 DIAGNOSIS — J111 Influenza due to unidentified influenza virus with other respiratory manifestations: Secondary | ICD-10-CM | POA: Insufficient documentation

## 2012-06-11 DIAGNOSIS — IMO0001 Reserved for inherently not codable concepts without codable children: Secondary | ICD-10-CM | POA: Insufficient documentation

## 2012-06-11 DIAGNOSIS — I252 Old myocardial infarction: Secondary | ICD-10-CM | POA: Insufficient documentation

## 2012-06-11 DIAGNOSIS — Z8673 Personal history of transient ischemic attack (TIA), and cerebral infarction without residual deficits: Secondary | ICD-10-CM | POA: Insufficient documentation

## 2012-06-11 DIAGNOSIS — R05 Cough: Secondary | ICD-10-CM | POA: Insufficient documentation

## 2012-06-11 DIAGNOSIS — I10 Essential (primary) hypertension: Secondary | ICD-10-CM | POA: Insufficient documentation

## 2012-06-11 DIAGNOSIS — R059 Cough, unspecified: Secondary | ICD-10-CM | POA: Insufficient documentation

## 2012-06-11 MED ORDER — BENZONATATE 100 MG PO CAPS: 100.0000 mg | ORAL_CAPSULE | Freq: Three times a day (TID) | ORAL | Status: DC

## 2012-06-11 MED ORDER — KETOROLAC TROMETHAMINE 30 MG/ML IJ SOLN
30.0000 mg | Freq: Once | INTRAMUSCULAR | Status: AC
  Administered 2012-06-11: 30 mg via INTRAMUSCULAR
  Filled 2012-06-11: qty 1

## 2012-06-11 NOTE — ED Notes (Signed)
Pt states understanding of discharge instructions 
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
## 2012-06-11 NOTE — ED Notes (Signed)
Pt arrived via [facility] c/o body aches, runny nose, fever last night, chest congestion, and HA. Pt with history of HTN. Has been of meds for a couple of weeks. EMS 12 lead unremarkable, BP 138/86.

## 2012-06-12 NOTE — ED Provider Notes (Signed)
Medical screening examination/treatment/procedure(s) were performed by non-physician practitioner and as supervising physician I was immediately available for consultation/collaboration.    Kristell Wooding D Erskin Zinda, MD 06/12/12 1533 

## 2012-06-18 ENCOUNTER — Encounter (HOSPITAL_COMMUNITY): Payer: Self-pay | Admitting: Emergency Medicine

## 2012-06-18 ENCOUNTER — Emergency Department (HOSPITAL_COMMUNITY)
Admission: EM | Admit: 2012-06-18 | Discharge: 2012-06-18 | Disposition: A | Discharge status: Home / Self Care | Payer: Self-pay | Attending: Emergency Medicine | Admitting: Emergency Medicine

## 2012-06-18 ENCOUNTER — Emergency Department (HOSPITAL_COMMUNITY): Payer: Self-pay

## 2012-06-18 DIAGNOSIS — R195 Other fecal abnormalities: Secondary | ICD-10-CM | POA: Insufficient documentation

## 2012-06-18 DIAGNOSIS — K921 Melena: Secondary | ICD-10-CM | POA: Insufficient documentation

## 2012-06-18 DIAGNOSIS — F17200 Nicotine dependence, unspecified, uncomplicated: Secondary | ICD-10-CM | POA: Insufficient documentation

## 2012-06-18 DIAGNOSIS — Z8673 Personal history of transient ischemic attack (TIA), and cerebral infarction without residual deficits: Secondary | ICD-10-CM | POA: Insufficient documentation

## 2012-06-18 DIAGNOSIS — I2510 Atherosclerotic heart disease of native coronary artery without angina pectoris: Secondary | ICD-10-CM | POA: Insufficient documentation

## 2012-06-18 DIAGNOSIS — R05 Cough: Secondary | ICD-10-CM | POA: Insufficient documentation

## 2012-06-18 DIAGNOSIS — R059 Cough, unspecified: Secondary | ICD-10-CM | POA: Insufficient documentation

## 2012-06-18 DIAGNOSIS — R109 Unspecified abdominal pain: Secondary | ICD-10-CM | POA: Insufficient documentation

## 2012-06-18 DIAGNOSIS — E119 Type 2 diabetes mellitus without complications: Secondary | ICD-10-CM | POA: Insufficient documentation

## 2012-06-18 DIAGNOSIS — R918 Other nonspecific abnormal finding of lung field: Secondary | ICD-10-CM | POA: Insufficient documentation

## 2012-06-18 DIAGNOSIS — Z79899 Other long term (current) drug therapy: Secondary | ICD-10-CM | POA: Insufficient documentation

## 2012-06-18 DIAGNOSIS — I10 Essential (primary) hypertension: Secondary | ICD-10-CM | POA: Insufficient documentation

## 2012-06-18 DIAGNOSIS — R42 Dizziness and giddiness: Secondary | ICD-10-CM | POA: Insufficient documentation

## 2012-06-18 DIAGNOSIS — R51 Headache: Secondary | ICD-10-CM | POA: Insufficient documentation

## 2012-06-18 LAB — TROPONIN I: Troponin I: <0.3 ng/mL (ref <0.30)

## 2012-06-18 LAB — COMPREHENSIVE METABOLIC PANEL
Alkaline Phosphatase: 62 U/L (ref 39–117)
BUN: 10 mg/dL (ref 6–23)
CO2: 28 mEq/L (ref 19–32)
Calcium: 9.5 mg/dL (ref 8.4–10.5)
GFR calc Af Amer: >90 mL/min (ref >90)
GFR calc non Af Amer: >90 mL/min (ref >90)
Glucose, Bld: 78 mg/dL (ref 70–99)
Total Protein: 6.8 g/dL (ref 6.0–8.3)

## 2012-06-18 LAB — CBC WITH DIFFERENTIAL/PLATELET
Basophils Absolute: 0 10*3/uL (ref 0.0–0.1)
Lymphs Abs: 1.9 10*3/uL (ref 0.7–4.0)
MCH: 25.5 pg — ABNORMAL LOW (ref 26.0–34.0)
MCHC: 35.5 g/dL (ref 30.0–36.0)
MCV: 71.9 fL — ABNORMAL LOW (ref 78.0–100.0)
Monocytes Absolute: 1.4 10*3/uL — ABNORMAL HIGH (ref 0.1–1.0)
Platelets: 294 10*3/uL (ref 150–400)
RDW: 13.1 % (ref 11.5–15.5)

## 2012-06-18 LAB — LIPASE, BLOOD: Lipase: 26 U/L (ref 11–59)

## 2012-06-18 MED ORDER — PANTOPRAZOLE SODIUM 40 MG PO TBEC
40.0000 mg | DELAYED_RELEASE_TABLET | Freq: Once | ORAL | Status: AC
  Administered 2012-06-18: 40 mg via ORAL
  Filled 2012-06-18: qty 1

## 2012-06-18 MED ORDER — AZITHROMYCIN 250 MG PO TABS: ORAL_TABLET | ORAL | Status: AC

## 2012-06-18 MED ORDER — SODIUM CHLORIDE 0.9 % IV BOLUS (SEPSIS)
1000.0000 mL | Freq: Once | INTRAVENOUS | Status: AC
  Administered 2012-06-18: 1000 mL via INTRAVENOUS

## 2012-06-18 MED ORDER — ONDANSETRON HCL 4 MG/2ML IJ SOLN
4.0000 mg | Freq: Once | INTRAMUSCULAR | Status: AC
  Administered 2012-06-18: 4 mg via INTRAVENOUS
  Filled 2012-06-18: qty 2

## 2012-06-18 MED ORDER — PANTOPRAZOLE SODIUM 20 MG PO TBEC: 40.0000 mg | DELAYED_RELEASE_TABLET | Freq: Every day | ORAL | Status: DC

## 2012-06-18 MED ORDER — ACETAMINOPHEN 325 MG PO TABS
650.0000 mg | ORAL_TABLET | Freq: Once | ORAL | Status: AC
  Administered 2012-06-18: 650 mg via ORAL
  Filled 2012-06-18: qty 2

## 2012-06-18 NOTE — ED Notes (Signed)
The pt has been to xray and back  No complaints

## 2012-06-18 NOTE — ED Notes (Signed)
The pt has been ill for the past week coughing non-productive he was seen here on Monday he has a rx for cough med that has not been filled.  He reports that he has not had a bm  For approx 7 days however he had a stool today that he reports is dark in color and soft ????.  Alert no distress he has asked for iv fluid.  Tylenol given
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
## 2012-06-18 NOTE — ED Provider Notes (Signed)
I saw and evaluated the patient, reviewed the resident's note and I agree with the findings and plan and agree with their ECG interpretation. Patient with multiple complaints. Weakness and cough. Blood in the stool. He is guaiac positive. Hemoglobin is reassuring. X-ray shows infiltrate. He has had continued cough and will be treated as an outpatient with antibiotics  Juliet Rude. Rubin Payor, MD 06/18/12 2248

## 2012-06-18 NOTE — ED Notes (Signed)
Received from home with c/o Weakness, fever, body aches, n/v x 1 week. Pt seen here Friday for same. Pt also reports seeing blood in his stool today. Pt describes stool as black. Pt last BM before today was Friday.

## 2012-07-31 ENCOUNTER — Encounter (HOSPITAL_COMMUNITY): Payer: Self-pay | Admitting: Physical Medicine and Rehabilitation

## 2012-07-31 ENCOUNTER — Emergency Department (HOSPITAL_COMMUNITY)
Admission: EM | Admit: 2012-07-31 | Discharge: 2012-08-01 | Disposition: A | Discharge status: Other Acute Inpatient Hospital | Payer: Self-pay | Attending: Emergency Medicine | Admitting: Emergency Medicine

## 2012-07-31 DIAGNOSIS — E119 Type 2 diabetes mellitus without complications: Secondary | ICD-10-CM | POA: Insufficient documentation

## 2012-07-31 DIAGNOSIS — I2510 Atherosclerotic heart disease of native coronary artery without angina pectoris: Secondary | ICD-10-CM | POA: Insufficient documentation

## 2012-07-31 DIAGNOSIS — Z8673 Personal history of transient ischemic attack (TIA), and cerebral infarction without residual deficits: Secondary | ICD-10-CM | POA: Insufficient documentation

## 2012-07-31 DIAGNOSIS — F1020 Alcohol dependence, uncomplicated: Secondary | ICD-10-CM | POA: Insufficient documentation

## 2012-07-31 DIAGNOSIS — K625 Hemorrhage of anus and rectum: Secondary | ICD-10-CM | POA: Insufficient documentation

## 2012-07-31 DIAGNOSIS — I10 Essential (primary) hypertension: Secondary | ICD-10-CM | POA: Insufficient documentation

## 2012-07-31 DIAGNOSIS — F17200 Nicotine dependence, unspecified, uncomplicated: Secondary | ICD-10-CM | POA: Insufficient documentation

## 2012-07-31 DIAGNOSIS — Z79899 Other long term (current) drug therapy: Secondary | ICD-10-CM | POA: Insufficient documentation

## 2012-07-31 HISTORY — DX: Unspecified glaucoma: H40.9

## 2012-07-31 HISTORY — DX: Alcohol abuse, uncomplicated: F10.10

## 2012-07-31 LAB — ACETAMINOPHEN LEVEL: Acetaminophen (Tylenol), Serum: <15 ug/mL (ref 10–30)

## 2012-07-31 LAB — CBC WITH DIFFERENTIAL/PLATELET
Basophils Absolute: 0 10*3/uL (ref 0.0–0.1)
Basophils Relative: 1 % (ref 0–1)
Eosinophils Relative: 4 % (ref 0–5)
Lymphocytes Relative: 40 % (ref 12–46)
MCHC: 35.3 g/dL (ref 30.0–36.0)
MCV: 74.2 fL — ABNORMAL LOW (ref 78.0–100.0)
Monocytes Absolute: 0.2 10*3/uL (ref 0.1–1.0)
Platelets: 308 10*3/uL (ref 150–400)
RDW: 15.1 % (ref 11.5–15.5)
WBC: 4.4 10*3/uL (ref 4.0–10.5)

## 2012-07-31 LAB — COMPREHENSIVE METABOLIC PANEL
ALT: 9 U/L (ref 0–53)
AST: 16 U/L (ref 0–37)
Albumin: 3.4 g/dL — ABNORMAL LOW (ref 3.5–5.2)
CO2: 24 mEq/L (ref 19–32)
Calcium: 9.4 mg/dL (ref 8.4–10.5)
Creatinine, Ser: 0.81 mg/dL (ref 0.50–1.35)
GFR calc non Af Amer: >90 mL/min (ref >90)
Sodium: 137 mEq/L (ref 135–145)
Total Protein: 7.4 g/dL (ref 6.0–8.3)

## 2012-07-31 LAB — RAPID URINE DRUG SCREEN, HOSP PERFORMED
Amphetamines: NOT DETECTED
Benzodiazepines: NOT DETECTED
Cocaine: NOT DETECTED
Opiates: NOT DETECTED

## 2012-07-31 LAB — SALICYLATE LEVEL: Salicylate Lvl: <2 mg/dL — ABNORMAL LOW (ref 2.8–20.0)

## 2012-07-31 LAB — OCCULT BLOOD, POC DEVICE: Fecal Occult Bld: NEGATIVE

## 2012-07-31 MED ORDER — VITAMIN B-1 100 MG PO TABS
100.0000 mg | ORAL_TABLET | Freq: Every day | ORAL | Status: DC
  Administered 2012-08-01: 100 mg via ORAL
  Filled 2012-07-31: qty 1

## 2012-07-31 MED ORDER — FOLIC ACID 1 MG PO TABS
1.0000 mg | ORAL_TABLET | Freq: Every day | ORAL | Status: DC
  Administered 2012-08-01: 1 mg via ORAL
  Filled 2012-07-31: qty 1

## 2012-07-31 MED ORDER — ADULT MULTIVITAMIN W/MINERALS CH
1.0000 | ORAL_TABLET | Freq: Every day | ORAL | Status: DC
  Administered 2012-08-01: 1 via ORAL
  Filled 2012-07-31: qty 1

## 2012-07-31 MED ORDER — LORAZEPAM 2 MG/ML IJ SOLN: 1.0000 mg | Freq: Four times a day (QID) | INTRAMUSCULAR | Status: DC | PRN

## 2012-07-31 MED ORDER — HYDROCHLOROTHIAZIDE 25 MG PO TABS
25.0000 mg | ORAL_TABLET | Freq: Every day | ORAL | Status: DC
  Administered 2012-08-01: 25 mg via ORAL
  Filled 2012-07-31: qty 1

## 2012-07-31 MED ORDER — LORAZEPAM 1 MG PO TABS
1.0000 mg | ORAL_TABLET | Freq: Four times a day (QID) | ORAL | Status: DC | PRN
  Administered 2012-08-01: 1 mg via ORAL
  Filled 2012-07-31: qty 1

## 2012-07-31 MED ORDER — THIAMINE HCL 100 MG/ML IJ SOLN: 100.0000 mg | Freq: Every day | INTRAMUSCULAR | Status: DC
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
## 2012-07-31 NOTE — ED Notes (Signed)
Pt called for the 3rd time for triage with no answer

## 2012-07-31 NOTE — ED Notes (Signed)
Did not answer when called for

## 2012-07-31 NOTE — ED Notes (Signed)
Pt presents to department for evaluation of diffuse abdominal pain and rectal bleeding. States he is heavy alcoholic, drinking x40 years, drinks x30 beers a day. Last drink this afternoon. Pt states trace amounts of blood on toliet tissue with wiping. 5/10 abdominal pain at the time. Pt is alert and oriented x4. No nausea/vomiting.

## 2012-08-01 ENCOUNTER — Inpatient Hospital Stay (HOSPITAL_COMMUNITY)
Admission: EM | Admit: 2012-08-01 | Discharge: 2012-08-09 | DRG: 897 | Disposition: A | Discharge status: Home / Self Care | Payer: Federal, State, Local not specified - Other | Source: Ambulatory Visit | Attending: Emergency Medicine | Admitting: Emergency Medicine

## 2012-08-01 ENCOUNTER — Encounter (HOSPITAL_COMMUNITY): Payer: Self-pay | Admitting: *Deleted

## 2012-08-01 DIAGNOSIS — F1021 Alcohol dependence, in remission: Secondary | ICD-10-CM | POA: Diagnosis present

## 2012-08-01 DIAGNOSIS — F10129 Alcohol abuse with intoxication, unspecified: Secondary | ICD-10-CM | POA: Diagnosis present

## 2012-08-01 DIAGNOSIS — Z79899 Other long term (current) drug therapy: Secondary | ICD-10-CM

## 2012-08-01 DIAGNOSIS — F1020 Alcohol dependence, uncomplicated: Principal | ICD-10-CM | POA: Diagnosis present

## 2012-08-01 DIAGNOSIS — I10 Essential (primary) hypertension: Secondary | ICD-10-CM | POA: Diagnosis present

## 2012-08-01 MED ORDER — CHLORDIAZEPOXIDE HCL 25 MG PO CAPS
25.0000 mg | ORAL_CAPSULE | Freq: Four times a day (QID) | ORAL | Status: AC | PRN
  Administered 2012-08-04: 25 mg via ORAL
  Filled 2012-08-01: qty 1

## 2012-08-01 MED ORDER — LORAZEPAM 1 MG PO TABS: 0.0000 mg | ORAL_TABLET | Freq: Two times a day (BID) | ORAL | Status: DC

## 2012-08-01 MED ORDER — LORAZEPAM 1 MG PO TABS: 1.0000 mg | ORAL_TABLET | Freq: Four times a day (QID) | ORAL | Status: DC | PRN

## 2012-08-01 MED ORDER — CHLORDIAZEPOXIDE HCL 25 MG PO CAPS
25.0000 mg | ORAL_CAPSULE | Freq: Three times a day (TID) | ORAL | Status: AC
  Administered 2012-08-03 (×3): 25 mg via ORAL
  Filled 2012-08-01 (×2): qty 1

## 2012-08-01 MED ORDER — LOPERAMIDE HCL 2 MG PO CAPS: 2.0000 mg | ORAL_CAPSULE | ORAL | Status: AC | PRN

## 2012-08-01 MED ORDER — CHLORDIAZEPOXIDE HCL 25 MG PO CAPS
25.0000 mg | ORAL_CAPSULE | Freq: Once | ORAL | Status: AC
  Administered 2012-08-01: 25 mg via ORAL
  Filled 2012-08-01: qty 1

## 2012-08-01 MED ORDER — ACETAMINOPHEN 325 MG PO TABS
650.0000 mg | ORAL_TABLET | Freq: Four times a day (QID) | ORAL | Status: DC | PRN
  Administered 2012-08-02 – 2012-08-09 (×7): 650 mg via ORAL

## 2012-08-01 MED ORDER — MAGNESIUM HYDROXIDE 400 MG/5ML PO SUSP
30.0000 mL | Freq: Every day | ORAL | Status: DC | PRN
  Administered 2012-08-02 – 2012-08-04 (×2): 30 mL via ORAL

## 2012-08-01 MED ORDER — NICOTINE 21 MG/24HR TD PT24
21.0000 mg | MEDICATED_PATCH | Freq: Every day | TRANSDERMAL | Status: DC
  Administered 2012-08-03 – 2012-08-09 (×6): 21 mg via TRANSDERMAL
  Filled 2012-08-01 (×11): qty 1

## 2012-08-01 MED ORDER — VITAMIN B-1 100 MG PO TABS
100.0000 mg | ORAL_TABLET | Freq: Every day | ORAL | Status: DC
  Administered 2012-08-02 – 2012-08-09 (×8): 100 mg via ORAL
  Filled 2012-08-01 (×10): qty 1

## 2012-08-01 MED ORDER — ONDANSETRON 4 MG PO TBDP
4.0000 mg | ORAL_TABLET | Freq: Four times a day (QID) | ORAL | Status: AC | PRN
  Administered 2012-08-02 – 2012-08-03 (×2): 4 mg via ORAL

## 2012-08-01 MED ORDER — HYDROCHLOROTHIAZIDE 25 MG PO TABS
25.0000 mg | ORAL_TABLET | Freq: Every day | ORAL | Status: DC
  Administered 2012-08-02 – 2012-08-09 (×6): 25 mg via ORAL
  Filled 2012-08-01: qty 14
  Filled 2012-08-01 (×7): qty 1
  Filled 2012-08-01: qty 14
  Filled 2012-08-01 (×2): qty 1

## 2012-08-01 MED ORDER — LORAZEPAM 1 MG PO TABS
2.0000 mg | ORAL_TABLET | Freq: Once | ORAL | Status: AC
  Administered 2012-08-01: 2 mg via ORAL
  Filled 2012-08-01: qty 2

## 2012-08-01 MED ORDER — CHLORDIAZEPOXIDE HCL 25 MG PO CAPS
25.0000 mg | ORAL_CAPSULE | Freq: Every day | ORAL | Status: AC
  Administered 2012-08-05: 25 mg via ORAL
  Filled 2012-08-01 (×2): qty 1

## 2012-08-01 MED ORDER — LORAZEPAM 2 MG/ML IJ SOLN: 1.0000 mg | Freq: Four times a day (QID) | INTRAMUSCULAR | Status: DC | PRN

## 2012-08-01 MED ORDER — ALUM & MAG HYDROXIDE-SIMETH 200-200-20 MG/5ML PO SUSP: 30.0000 mL | ORAL | Status: DC | PRN

## 2012-08-01 MED ORDER — ADULT MULTIVITAMIN W/MINERALS CH
1.0000 | ORAL_TABLET | Freq: Every day | ORAL | Status: DC
  Administered 2012-08-02 – 2012-08-09 (×8): 1 via ORAL
  Filled 2012-08-01 (×11): qty 1

## 2012-08-01 MED ORDER — LORAZEPAM 1 MG PO TABS
0.0000 mg | ORAL_TABLET | Freq: Four times a day (QID) | ORAL | Status: DC
  Administered 2012-08-01: 1 mg via ORAL
  Filled 2012-08-01: qty 1

## 2012-08-01 MED ORDER — THIAMINE HCL 100 MG/ML IJ SOLN: 100.0000 mg | Freq: Once | INTRAMUSCULAR | Status: DC

## 2012-08-01 MED ORDER — CLONIDINE HCL 0.1 MG PO TABS
0.1000 mg | ORAL_TABLET | Freq: Once | ORAL | Status: AC
  Administered 2012-08-01: 0.1 mg via ORAL
  Filled 2012-08-01: qty 1

## 2012-08-01 MED ORDER — HYDROXYZINE HCL 25 MG PO TABS
25.0000 mg | ORAL_TABLET | Freq: Four times a day (QID) | ORAL | Status: AC | PRN
  Administered 2012-08-02 – 2012-08-03 (×2): 25 mg via ORAL

## 2012-08-01 MED ORDER — CHLORDIAZEPOXIDE HCL 25 MG PO CAPS
25.0000 mg | ORAL_CAPSULE | Freq: Four times a day (QID) | ORAL | Status: AC
  Administered 2012-08-01 – 2012-08-02 (×5): 25 mg via ORAL
  Filled 2012-08-01 (×7): qty 1

## 2012-08-01 MED ORDER — CHLORDIAZEPOXIDE HCL 25 MG PO CAPS
25.0000 mg | ORAL_CAPSULE | ORAL | Status: AC
  Administered 2012-08-04 (×2): 25 mg via ORAL
  Filled 2012-08-01: qty 1

## 2012-08-01 NOTE — ED Notes (Signed)
Spoke with dawn at [facility]. She advises to send pt to [facility].

## 2012-08-01 NOTE — ED Notes (Signed)
Dr Anitra Lauth updated on pt bp. Also made aware of pt increased sweating. Orders given. Will reeval bp and symptoms within the hour

## 2012-08-01 NOTE — ED Notes (Signed)
Patient asked for and received diet coke.

## 2012-08-01 NOTE — ED Notes (Signed)
Pt sweating has decreased. States feels drunk. Advised was the ativan

## 2012-08-01 NOTE — ED Provider Notes (Signed)
Patient is awake and alert this morning with only minimal withdrawals not requesting any medications. Currently waiting for [facility]  Gwyneth Sprout, MD 08/01/12 [phone]

## 2012-08-01 NOTE — ED Notes (Signed)
Called report to behavioral health. They will not accept pt until bp is lower.

## 2012-08-01 NOTE — ED Notes (Addendum)
Pt denies SI/HI/AVH.  Pt states that he is looking for detox.  Pt states that he has been drinking for 40 years.  He states that he drinks 7-8 40 oz beers currently.  Pt rates his depression as 6 and hopelessness as 1.  He states that he has 14 grandchildren and wants to see them grow up.  He also lost 2 sisters to alcoholism.  Pt states "I'm sick and tired."  He has lost several jobs to ETOH abuse and wants to seek long term treatment. Pt has experienced seizures from detox several years ago.

## 2012-08-01 NOTE — ED Notes (Signed)
PT STATES HE FEELS "GOOD". STATES HE IS A BIT SHAKEY BUT HE IS HUNGRY. STATES HE IS "TIRED" OF DRINKING AND WANTS TO GO TO A LONG TERM TREATMENT PROGRAM.

## 2012-08-01 NOTE — Tx Team (Addendum)
Initial Interdisciplinary Treatment Plan  PATIENT STRENGTHS: (choose at least two) Communication skills Motivation for treatment/growth Supportive family/friends  PATIENT STRESSORS: Financial difficulties Health problems Substance abuse   PROBLEM LIST: Problem List/Patient Goals Date to be addressed Date deferred Reason deferred Estimated date of resolution  etoh abuse 312/14     Financial/occupational issues 08/01/12     Med non-compliance 08/01/12     HTN 08/01/12                                    DISCHARGE CRITERIA:  Ability to meet basic life and health needs Medical problems require only outpatient monitoring Motivation to continue treatment in a less acute level of care Need for constant or close observation no longer present Verbal commitment to aftercare and medication compliance Withdrawal symptoms are absent or subacute and managed without 24-hour nursing intervention  PRELIMINARY DISCHARGE PLAN: Attend 12-step recovery group Participate in family therapy Return to previous living arrangement  PATIENT/FAMIILY INVOLVEMENT: This treatment  has been presented to and reviewed with the patient, Troy Mclaughlin, and/or family member,  The patient and family have been given the opportunity to ask questions and make suggestions.  Cresenciano Lick 08/01/2012, 6:19 PM
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
## 2012-08-02 ENCOUNTER — Encounter (HOSPITAL_COMMUNITY): Payer: Self-pay | Admitting: Psychiatry

## 2012-08-02 MED ORDER — TRAZODONE HCL 50 MG PO TABS
50.0000 mg | ORAL_TABLET | Freq: Every evening | ORAL | Status: DC | PRN
  Administered 2012-08-02: 50 mg via ORAL

## 2012-08-02 MED ORDER — ENSURE COMPLETE PO LIQD
237.0000 mL | Freq: Two times a day (BID) | ORAL | Status: DC
  Administered 2012-08-03 – 2012-08-09 (×12): 237 mL via ORAL
  Filled 2012-08-02: qty 237

## 2012-08-02 NOTE — [facility] Counselor (Signed)
Adult Comprehensive Assessment  Patient ID: Troy Mclaughlin, male   DOB: Dec 05, 1955, 57 y.o.   MRN: [id number]  Information Source: Information source: Patient  Current Stressors:  Educational / Learning stressors: None Employment / Job issues: None - Patient reports he is trying to get disability Family Relationships: Okay relationships with family Financial / Lack of resources (include bankruptcy): Having difficult due to no income Housing / Lack of housing: None Physical health (include injuries & life threatening diseases): Diabetes, HTN, CAD and hx of stroke Social relationships: None Substance abuse: Patient reports drinking five to six 40s daily for the past three years  Living/Environment/Situation:  Living Arrangements: Spouse/significant other Living conditions (as described by patient or guardian): good How long has patient lived in current situation?: six months What is atmosphere in current home: Comfortable;Supportive  Family History:  Marital status: Married Number of Years Married: 3 What types of issues is patient dealing with in the relationship?: Patient reports wife has an alcohol problems as well Does patient have children?: Yes How many children?: 1 How is patient's relationship with their children?: Good relationship with son  Childhood History:  By whom was/is the patient raised?: Both parents Additional childhood history information: good childhood Description of patient's relationship with caregiver when they were a child: Good relationship with parents as a child Patient's description of current relationship with people who raised him/her: Relationshjip is good Does patient have siblings?: Yes Number of Siblings: 2 Did patient suffer any verbal/emotional/physical/sexual abuse as a child?: No Did patient suffer from severe childhood neglect?: No Has patient ever been sexually abused/assaulted/raped as an adolescent or adult?: No Was the patient ever a  victim of a crime or a disaster?: No Witnessed domestic violence?: No Has patient been effected by domestic violence as an adult?: No  Education:  Highest grade of school patient has completed: 8th Currently a student?: No Learning disability?: No  Employment/Work Situation:   Employment situation: Unemployed Patient's job has been impacted by current illness: No What is the longest time patient has a held a job?: six years Where was the patient employed at that time?: Holiday representative Has patient ever been in the [employer]?: No Has patient ever served in Buyer, retail?: No  Financial Resources:   Surveyor, quantity resources: No income Does patient have a Lawyer or guardian?: No  Alcohol/Substance Abuse:   What has been your use of drugs/alcohol within the last 12 months?: five to six 40 ounce beers daily If attempted suicide, did drugs/alcohol play a role in this?: No Alcohol/Substance Abuse Treatment Hx: Past Tx, Inpatient If yes, describe treatment: Reports treatment at [facility] years ago Has alcohol/substance abuse ever caused legal problems?: No  Social Support System:   Forensic psychologist System: None Type of faith/religion: Ephriam Knuckles How does patient's faith help to cope with current illness?: Does not use faith  Leisure/Recreation:   Leisure and Hobbies: Social worker:   What things does the patient do well?: Inspiring others by telling story of his life In what areas does patient struggle / problems for patient: Lack of self confidence  Discharge Plan:   Does patient have access to transportation?: Yes Will patient be returning to same living situation after discharge?: No (patient would like residential tx before returning home.) Plan for living situation after discharge: Treatmetn and then home with wife Currently receiving community mental health services: No If no, would patient like referral for services when discharged?: Yes (What  county?) Does patient have financial barriers related  to discharge medications?: Yes Patient description of barriers related to discharge medications: Patient does not have insurance or income  Summary/Recommendations:  Troy Mclaughlin is a 57 years old African American male admitted with Alcohol dependence.  He will Patient will benefit from crisis stabilization, detox from alcohol, evaluation for medication management, psycho education groups for coping skills development, group therapy and assistance with discharge planning.     Hodnett, Joesph July. 08/02/2012

## 2012-08-02 NOTE — Progress Notes (Signed)
[facility] LCSW Group Therapy  08/02/2012 5:57 PM  Type of Therapy:  Group Therapy  Participation Level:  Did Not Attend   Bournes, Roshelle L 08/02/2012, 5:57 PM
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
## 2012-08-02 NOTE — Progress Notes (Signed)
D: Pt in bed resting with eyes closed. Respirations even and unlabored. Pt appears to be in no signs of distress at this time. A: Q15min checks remains for this pt. R: Pt remains safe at this time.   

## 2012-08-02 NOTE — [facility] Suicide Risk Assessment (Signed)
Suicide Risk Assessment  Admission Assessment     Nursing information obtained from:    Demographic factors:    Current Mental Status:    Loss Factors:    Historical Factors:    Risk Reduction Factors:     CLINICAL FACTORS:   Depression:   Comorbid alcohol abuse/dependence Alcohol/Substance Abuse/Dependencies  COGNITIVE FEATURES THAT CONTRIBUTE TO RISK:  Closed-mindedness Polarized thinking Thought constriction (tunnel vision)    SUICIDE RISK:   Moderate:  Frequent suicidal ideation with limited intensity, and duration, some specificity in terms of plans, no associated intent, good self-control, limited dysphoria/symptomatology, some risk factors present, and identifiable protective factors, including available and accessible social support.  PLAN OF CARE: Supportive approach/coping skills/relapse prevention                               [facility] Detox protocol                               Reassess co morbidities  I certify that inpatient services furnished can reasonably be expected to improve the patient's condition.  Kasidy Gianino A 08/02/2012, 5:29 PM

## 2012-08-02 NOTE — Progress Notes (Signed)
Patient ID: Troy Mclaughlin, male   DOB: 1955/10/27, 57 y.o.   MRN: [id number] He has been up for part of the groups , medication and meals. C/o being tired and dizzy when up and wants to rest. He was given Gatorade and was encouraged to drink liquids,

## 2012-08-02 NOTE — Progress Notes (Signed)
Psychoeducational Group Note  Date:  08/02/2012 Time: 2100    Group Topic/Focus:  wrap up  Participation Level: Did Not Attend  Participation Quality:  Not Applicable  Affect:  Not Applicable  Cognitive:  Not Applicable  Insight:  Not Applicable  Engagement in Group: Not Applicable  Additional Comments: Pt was in bed asleep during group.  Shelah Lewandowsky 08/02/2012, 11:30 PM

## 2012-08-02 NOTE — Progress Notes (Signed)
[facility] LCSW Aftercare Discharge Planning Group Note  08/02/2012 2:15 PM  Participation Quality:  Did not attend   Karuna Balducci L 08/02/2012, 2:15 PM

## 2012-08-02 NOTE — Progress Notes (Signed)
Recreation Therapy Notes  Date: 03.13.2014  Time: 3:00pm  Location: [address] Day Room   Group Topic/Focus: Leisure Education   Participation Level:  Did not attend   [name], LRT/CTRS   Jearl Klinefelter 08/02/2012 4:14 PM

## 2012-08-02 NOTE — Progress Notes (Signed)
Pt did not attend group, despite being encouraged to do so by staff. Pt remained in room resting in bed for the duration of group.

## 2012-08-03 MED ORDER — TRAZODONE HCL 100 MG PO TABS
100.0000 mg | ORAL_TABLET | Freq: Every evening | ORAL | Status: DC | PRN
  Administered 2012-08-03 – 2012-08-06 (×3): 100 mg via ORAL
  Filled 2012-08-03 (×3): qty 1

## 2012-08-03 NOTE — Progress Notes (Signed)
Pt is resting in bed with eyes closed. RR WNL, even and unlabored. No distress noted. Level III obs in place and pt remains safe. Lawrence Marseilles

## 2012-08-03 NOTE — Progress Notes (Signed)
[facility] Group Notes:  (Nursing/MHT/Case Management/Adjunct)  Date:  08/03/2012  Time:  12:56 PM  Type of Therapy:  Psychoeducational Skills  Participation Level:  None  Participation Quality:  Inattentive and Resistant  Affect:  Flat  Engagement in Group:  None  Modes of Intervention:  Activity and Socialization  Summary of Progress/Problems: Pt did not engage in Therapeutic Activity where pts were asked to participate in a game called "Would you rather". Pt sat silently throughout group.   Dalia Heading 08/03/2012, 12:56 PM

## 2012-08-03 NOTE — Progress Notes (Signed)
D:  Troy Mclaughlin had been up more today and has attended morning groups.  He complained of being dizzy and did not want to go to group, but after encouragement he did attend.  He denies SI/HI/AVH at this time.  He is interacting appropriately with staff and other patients.   A:  Safety checks q 15 minutes.  Emotional support provided.  Medications given as ordered. R:  Safety maintained on unit.

## 2012-08-03 NOTE — Progress Notes (Signed)
[facility] LCSW Group Therapy  08/03/2012 1:15 PM  Type of Therapy:  Group Therapy from 1:15 to 2:30 PM  Participation Level:  Did Not attend; Not feeling well at all.  Patient reports he needs to go somewhere eleses for further treatment after detox.  Troy Mclaughlin

## 2012-08-03 NOTE — Progress Notes (Addendum)
[facility] LCSW Aftercare Discharge Planning Group Note  08/03/2012 8:45 AM  Participation Quality:  Appropriate, Attentive and Sharing  Affect:  Appropriate, very soft spoken and mannerly   Cognitive:  Alert and Oriented  Insight:   Limited  Engagement in Group: Limited  Modes of Intervention:  Clarification, Exploration, Rapport Building and Support  Summary of Progress/Problems:  Pt denies both suicidal and homicidal ideation.  On a scale of 1 to 10 with ten being the most ever experienced, the patient rates depression at a 1 and anxiety at a 1. Patient reports he has drand for many years, decades really and would like inpatient treatment.    Clide Dales

## 2012-08-03 NOTE — Tx Team (Signed)
Interdisciplinary Treatment Plan Update (Adult)  Date: 08/03/2012  Time Reviewed: 10:15 AM   Progress in Treatment: Attending groups: Not consistently Participating in groups: Not consistently Taking medication as prescribed:  Yes Tolerating medication:  Yes Family/Significant othe contact made: Not as yet Patient understands diagnosis: Yes Discussing patient identified problems/goals with staff: Yes Medical problems stabilized or resolved:  Yes Denies suicidal/homicidal ideation: Yes Patient has not harmed self or Others: Yes  New problem(s) identified: None Identified  Discharge Plan or Barriers:  CSW has followup set at [facility] for treatment bed 3/27 yet will also explore earlier opportunities at [facility]  Additional comments: N/A  Reason for Continuation of Hospitalization: Medical Issues Medication stabilization With drawal symptoms   Estimated length of stay: 5-7 days  For review of initial/current patient goals, please see plan of care.  Attendees: Patient:     Family:     Physician:  Geoffery Lyons 08/03/2012 10:26 AM   Nursing:   Burnetta Sabin, RN 08/03/2012 10:26 AM   Clinical Social Worker Ronda Fairly 08/03/2012 10:26 AM   Other:  Serena Colonel, PA 08/03/2012 10:26 AM   Other:  Concha Se, [employer] PA Student 08/03/2012 10:26 AM   Other:  Vesta Mixer Liaison 08/03/2012 10:26 AM   Other:   08/03/2012 10:26 AM    Scribe for Treatment Team:   Carney Bern, LCSWA  08/03/2012 10:26 AM

## 2012-08-03 NOTE — Plan of Care (Signed)
Problem: Ineffective individual coping Goal: STG: Patient will participate in after care plan Outcome: Progressing Patient reports desire for inpatient treatment after detox  Problem: Alteration in mood & ability to function due to Goal: LTG-Patient demonstrates decreased signs of withdrawal (Patient demonstrates decreased signs of withdrawal to the point the patient is safe to return home and continue treatment in an outpatient setting)  Outcome: Not Progressing Patient still very weak and experiencing withdrawal  Problem: Alteration in mood Goal: LTG-Pt's behavior demonstrates decreased signs of depression (Patient's behavior demonstrates decreased signs of depression to the point the patient is safe to return home and continue treatment in an outpatient setting)  Outcome: Not Progressing Patient isolating in room today other than two morning groups.

## 2012-08-03 NOTE — Progress Notes (Signed)
[facility] MD Progress Note  08/03/2012 5:46 PM Troy Mclaughlin  MRN:  [id number] Subjective:  Does not feel well. He did not sleep too well last night. Feels weak. States that he is worried that if he was to be back in the streets he was not going to make it. States that where he was living is not an environment conductive towards he being sober, healthy. States he sees himself dying if he was to relapse again "I am a sick man." He would like to go to a long term treatment facility. Admits to being hopeless and feeling suicidal (he would rather be dead than through going through this again Diagnosis:  Alcohol Dependence, Depressive Disorder NOS  ADL's:  Intact  Sleep: Poor  Appetite:  Poor  Suicidal Ideation:  Plan:  ideas, no plans Intent:  denies Means:  denies Homicidal Ideation:  Plan:  denies Intent:  denies Means:  denies AEB (as evidenced by):  Psychiatric Specialty Exam: Review of Systems  Constitutional: Positive for malaise/fatigue.  HENT: Negative.   Eyes: Negative.   Respiratory: Negative.   Cardiovascular: Negative.   Gastrointestinal: Positive for abdominal pain.  Genitourinary: Negative.   Musculoskeletal: Negative.   Skin: Negative.   Neurological: Positive for dizziness and weakness.  Endo/Heme/Allergies: Negative.   Psychiatric/Behavioral: Positive for depression and substance abuse. The patient is nervous/anxious and has insomnia.     Blood pressure 113/76, pulse 128, temperature 98.2 F (36.8 C), temperature source Oral, resp. rate 18, height 0' (0 m), weight 60.328 kg (133 lb).Cannot calculate BMI with a height equal to zero.  General Appearance: Disheveled  Eye Solicitor::  Fair  Speech:  Clear and Coherent, Slow and not spontnaneous  Volume:  Decreased  Mood:  Depressed and Hopeless  Affect:  Restricted  Thought Process:  Coherent and Goal Directed  Orientation:  Full (Time, Place, and Person)  Thought Content:  worries, concerns  Suicidal Thoughts:   Yes.  without intent/plan  Homicidal Thoughts:  No  Memory:  Immediate;   Fair Recent;   Fair Remote;   Fair  Judgement:  Fair  Insight:  superficial  Psychomotor Activity:  Decreased  Concentration:  Fair  Recall:  Fair  Akathisia:  No  Handed:  Right  AIMS (if indicated):     Assets:  Desire for Improvement  Sleep:  Number of Hours: 5.75   Current Medications: Current Facility-Administered Medications  Medication Dose Route Frequency Provider Last Rate Last Dose  . acetaminophen (TYLENOL) tablet 650 mg  650 mg Oral Q6H PRN Sanjuana Kava, NP   650 mg at 08/02/12 0657  . alum & mag hydroxide-simeth (MAALOX/MYLANTA) 200-200-20 MG/5ML suspension 30 mL  30 mL Oral Q4H PRN Sanjuana Kava, NP      . chlordiazePOXIDE (LIBRIUM) capsule 25 mg  25 mg Oral Q6H PRN Sanjuana Kava, NP      . Melene Muller ON 08/04/2012] chlordiazePOXIDE (LIBRIUM) capsule 25 mg  25 mg Oral BH-qamhs Sanjuana Kava, NP       Followed by  . [START ON 08/05/2012] chlordiazePOXIDE (LIBRIUM) capsule 25 mg  25 mg Oral Daily Sanjuana Kava, NP      . feeding supplement (ENSURE COMPLETE) liquid 237 mL  237 mL Oral BID BM Rachael Fee, MD   237 mL at 08/03/12 1325  . hydrochlorothiazide (HYDRODIURIL) tablet 25 mg  25 mg Oral Daily Sanjuana Kava, NP   25 mg at 08/03/12 0828  . hydrOXYzine (ATARAX/VISTARIL) tablet 25 mg  25 mg Oral Q6H PRN Sanjuana Kava, NP   25 mg at 08/02/12 0657  . loperamide (IMODIUM) capsule 2-4 mg  2-4 mg Oral PRN Sanjuana Kava, NP      . magnesium hydroxide (MILK OF MAGNESIA) suspension 30 mL  30 mL Oral Daily PRN Sanjuana Kava, NP   30 mL at 08/02/12 0657  . multivitamin with minerals tablet 1 tablet  1 tablet Oral Daily Sanjuana Kava, NP   1 tablet at 08/03/12 2952  . nicotine (NICODERM CQ - dosed in mg/24 hours) patch 21 mg  21 mg Transdermal Q0600 Sanjuana Kava, NP   21 mg at 08/03/12 8413  . ondansetron (ZOFRAN-ODT) disintegrating tablet 4 mg  4 mg Oral Q6H PRN Sanjuana Kava, NP   4 mg at 08/03/12 1735  .  thiamine (B-1) injection 100 mg  100 mg Intramuscular Once Sanjuana Kava, NP      . thiamine (VITAMIN B-1) tablet 100 mg  100 mg Oral Daily Sanjuana Kava, NP   100 mg at 08/03/12 2440  . traZODone (DESYREL) tablet 50 mg  50 mg Oral QHS PRN Rachael Fee, MD   50 mg at 08/02/12 2206    Lab Results: No results found for this or any previous visit (from the past 48 hour(s)).  Physical Findings: AIMS: Facial and Oral Movements Muscles of Facial Expression: None, normal Lips and Perioral Area: None, normal Jaw: None, normal Tongue: None, normal,Extremity Movements Upper (arms, wrists, hands, fingers): None, normal Lower (legs, knees, ankles, toes): None, normal, Trunk Movements Neck, shoulders, hips: None, normal, Overall Severity Severity of abnormal movements (highest score from questions above): None, normal Incapacitation due to abnormal movements: None, normal Patient's awareness of abnormal movements (rate only patient's report): No Awareness, Dental Status Current problems with teeth and/or dentures?: No Does patient usually wear dentures?: No  CIWA:  CIWA-Ar Total: 2 COWS:     Treatment Plan Summary: Daily contact with patient to assess and evaluate symptoms and progress in treatment Medication management  Plan: Supportive approach/coping skills/relapse prevention           Continue Detox            Reassess co morbidities            Proper therapeutic placement once discharged            Increase Trazodone Medical Decision Making Problem Points:  Review of psycho-social stressors (1) Data Points:  Review of medication regiment & side effects (2)  I certify that inpatient services furnished can reasonably be expected to improve the patient's condition.   LUGO,IRVING A 08/03/2012, 5:46 PM

## 2012-08-04 MED ORDER — MAGNESIUM CITRATE PO SOLN
1.0000 | Freq: Once | ORAL | Status: AC
  Administered 2012-08-04: 1 via ORAL

## 2012-08-04 NOTE — Progress Notes (Addendum)
Patient ID: Troy Mclaughlin, male   DOB: Dec 06, 1955, 57 y.o.   MRN: [id number] D: Pt is awake and active on the unit this AM. Pt denies SI/HI and A/V hallucinations. Pt is unable to participate in the milieu due to weakness/withdrawl, but he is cooperative with staff. Pt rates their depression at 3 and hopelessness at 5. Pt's mood is depressed and his affect is anxious/sad. Pt c/o generalized weakness and needs rest for the time being. Pt has cataracts in both eyes and reports having macular degeneration as well. Pt is hypotensive and Clinical research associate held b/p medications this AM. Writer encouraged fluids and provided a pitcher of water and encouraged him to get up slowly in order to avoid a fall. Pt c/o constipation and received MOM. Pt is unable to write due to poor vision and requires assistance with self inventory.   A: Writer utilized therapeutic communication, encouraged pt to discuss feelings with staff and administered medication per MD orders. Writer also encouraged pt to attend groups.  R: Pt is attending groups and tolerating medications well. Writer will continue to monitor. 15 minute checks are ongoing for safety.

## 2012-08-04 NOTE — Progress Notes (Addendum)
Patient ID: Troy Mclaughlin, male   DOB: [birth year], 57 y.o.   MRN: [id number] [facility] MD Progress Note  08/04/2012 3:01 PM Troy Mclaughlin  MRN:  [id number]  Subjective:  "I feel worry, I feel bad like some one is going to give me bad news. I don't know what kind of bad news. I feel anxious, nauseated. I don't have not any appetite. I have not been to the bathroom since last Wednesday. I threw up twice already today. I am not sleeping well. I need a treatment place. A place that I can go for a year or so. But I don't want to go [place], [place] for treatment. I know there is a place in for substance abuse treatment. I just could not remember the name of this place. I think about dying if I don't I can't stop drinking. But I need help to help me stop".  Diagnosis:  Alcohol Dependence, Depressive Disorder NOS  ADL's:  Intact  Sleep: Poor  Appetite:  Poor  Suicidal Ideation:  Plan:  ideas, no plans Intent:  denies Means:  denies Homicidal Ideation:  Plan:  denies Intent:  denies Means:  denies AEB (as evidenced by):  Psychiatric Specialty Exam: Review of Systems  Constitutional: Positive for malaise/fatigue.  HENT: Negative.   Eyes: Negative.   Respiratory: Negative.   Cardiovascular: Negative.   Gastrointestinal: Positive for abdominal pain.  Genitourinary: Negative.   Musculoskeletal: Negative.   Skin: Negative.   Neurological: Positive for dizziness and weakness.  Endo/Heme/Allergies: Negative.   Psychiatric/Behavioral: Positive for depression and substance abuse. The patient is nervous/anxious and has insomnia.     Blood pressure 113/76, pulse 111, temperature 97.4 F (36.3 C), temperature source Oral, resp. rate 16, height 0' (0 m), weight 60.328 kg (133 lb).Cannot calculate BMI with a height equal to zero.  General Appearance: Disheveled  Eye Solicitor::  Fair  Speech:  Clear and Coherent, Slow and not spontnaneous  Volume:  Decreased  Mood:  Depressed and Hopeless  Affect:   Restricted  Thought Process:  Coherent and Goal Directed  Orientation:  Full (Time, Place, and Person)  Thought Content:  worries, concerns  Suicidal Thoughts:  Yes.  without intent/plan  Homicidal Thoughts:  No  Memory:  Immediate;   Fair Recent;   Fair Remote;   Fair  Judgement:  Fair  Insight:  superficial  Psychomotor Activity:  Decreased  Concentration:  Fair  Recall:  Fair  Akathisia:  No  Handed:  Right  AIMS (if indicated):     Assets:  Desire for Improvement  Sleep:  Number of Hours: 6.5   Current Medications: Current Facility-Administered Medications  Medication Dose Route Frequency Provider Last Rate Last Dose  . acetaminophen (TYLENOL) tablet 650 mg  650 mg Oral Q6H PRN Sanjuana Kava, NP   650 mg at 08/04/12 0839  . alum & mag hydroxide-simeth (MAALOX/MYLANTA) 200-200-20 MG/5ML suspension 30 mL  30 mL Oral Q4H PRN Sanjuana Kava, NP      . chlordiazePOXIDE (LIBRIUM) capsule 25 mg  25 mg Oral Q6H PRN Sanjuana Kava, NP   25 mg at 08/04/12 1203  . chlordiazePOXIDE (LIBRIUM) capsule 25 mg  25 mg Oral BH-qamhs Sanjuana Kava, NP   25 mg at 08/04/12 7829   Followed by  . [START ON 08/05/2012] chlordiazePOXIDE (LIBRIUM) capsule 25 mg  25 mg Oral Daily Sanjuana Kava, NP      . feeding supplement (ENSURE COMPLETE) liquid 237 mL  237 mL  Oral BID BM Rachael Fee, MD   237 mL at 08/04/12 1446  . hydrochlorothiazide (HYDRODIURIL) tablet 25 mg  25 mg Oral Daily Sanjuana Kava, NP   25 mg at 08/03/12 1610  . hydrOXYzine (ATARAX/VISTARIL) tablet 25 mg  25 mg Oral Q6H PRN Sanjuana Kava, NP   25 mg at 08/03/12 2127  . loperamide (IMODIUM) capsule 2-4 mg  2-4 mg Oral PRN Sanjuana Kava, NP      . magnesium hydroxide (MILK OF MAGNESIA) suspension 30 mL  30 mL Oral Daily PRN Sanjuana Kava, NP   30 mL at 08/04/12 0837  . multivitamin with minerals tablet 1 tablet  1 tablet Oral Daily Sanjuana Kava, NP   1 tablet at 08/04/12 0837  . nicotine (NICODERM CQ - dosed in mg/24 hours) patch 21 mg  21 mg  Transdermal Q0600 Sanjuana Kava, NP   21 mg at 08/04/12 9604  . ondansetron (ZOFRAN-ODT) disintegrating tablet 4 mg  4 mg Oral Q6H PRN Sanjuana Kava, NP   4 mg at 08/03/12 1735  . thiamine (B-1) injection 100 mg  100 mg Intramuscular Once Sanjuana Kava, NP      . thiamine (VITAMIN B-1) tablet 100 mg  100 mg Oral Daily Sanjuana Kava, NP   100 mg at 08/04/12 0837  . traZODone (DESYREL) tablet 100 mg  100 mg Oral QHS PRN Rachael Fee, MD   100 mg at 08/03/12 2127    Lab Results: No results found for this or any previous visit (from the past 48 hour(s)).  Physical Findings: AIMS: Facial and Oral Movements Muscles of Facial Expression: None, normal Lips and Perioral Area: None, normal Jaw: None, normal Tongue: None, normal,Extremity Movements Upper (arms, wrists, hands, fingers): None, normal Lower (legs, knees, ankles, toes): None, normal, Trunk Movements Neck, shoulders, hips: None, normal, Overall Severity Severity of abnormal movements (highest score from questions above): None, normal Incapacitation due to abnormal movements: None, normal Patient's awareness of abnormal movements (rate only patient's report): No Awareness, Dental Status Current problems with teeth and/or dentures?: No Does patient usually wear dentures?: No  CIWA:  CIWA-Ar Total: 13 COWS:     Treatment Plan Summary: Daily contact with patient to assess and evaluate symptoms and progress in treatment Medication management  Plan: Supportive approach/coping skills/relapse prevention. Continue Detox Reassess co morbidities, Administer Magnesium Citrate 1 bottle once. Start Neurontin 100 mg tid for anxiety. Proper therapeutic placement once discharged. Continue current treatment plan.  Medical Decision Making Problem Points:  Review of psycho-social stressors (1) Data Points:  Review of medication regiment & side effects (2)  I certify that inpatient services furnished can reasonably be expected to improve the  patient's condition.   Armandina Stammer I 08/04/2012, 3:01 PM  Discussed with provider and agree with above.  Jacqulyn Cane, M.D.  08/04/2012 11:14 PM

## 2012-08-04 NOTE — Progress Notes (Signed)
Psychoeducational Group Note  Date:  08/03/2012 Time:  2000  Group Topic/Focus:  Wrap-Up Group:   The focus of this group is to help patients review their daily goal of treatment and discuss progress on daily workbooks.  Participation Level: Did Not Attend  Participation Quality:  Not Applicable  Affect:  Not Applicable  Cognitive:  Not Applicable  Insight:  Not Applicable  Engagement in Group: Not Applicable  Additional Comments:  The patient was encouraged to attend group, but he merely rolled over and went back to sleep.   GOODMAN, BENJAMIN S 08/04/2012, 1:29 AM

## 2012-08-04 NOTE — Clinical Social Work Note (Signed)
[facility] Group Notes: (Clinical Social Work)   08/04/2012      Type of Therapy:  Group Therapy   Participation Level:  Did Not Attend    Ambrose Mantle, LCSW 08/04/2012, 12:20 PM

## 2012-08-04 NOTE — Progress Notes (Signed)
Patient ID: Troy Mclaughlin, male   DOB: 27-Oct-1955, 57 y.o.   MRN: [id number] D: Patient in the bed. Respirations even and non-labored A: Staff will monitor on q 15 minute checks, follow treatment plan, and give meds as ordered. R: Appears asleep

## 2012-08-04 NOTE — Progress Notes (Signed)
Patient did not attend the evening speaker AA meeting. When pt was invited to group, pt replied "I'm all right". Pt encouraged to go but said he felt weak.  Pt remained in bed.

## 2012-08-04 NOTE — Progress Notes (Signed)
D. Pt has been in room much of the day, limited interaction or participation in milieu. Pt has spoken about how he has a drinking problem and wants to go to a long term treatment program from here as he says if he gets out of here without going there he knows he will start using again and is afraid that it would kill him. Pt stated that he wants to live and does not want to use anymore and needs help with his addictions. Pt has received medications today without incident. A. Support and encouragement provided. R. Will continue to monitor.

## 2012-08-04 NOTE — Progress Notes (Signed)
Adult Psychoeducational Group Note  Date:  08/04/2012 Time:  10:32 AM  Group Topic/Focus:  Self-Inventory  Participation Level:  Did Not Attend  Additional Comments:  Pt did not attend despite encouragement from staff.  Alfonse Spruce 08/04/2012, 10:32 AM

## 2012-08-04 NOTE — Progress Notes (Signed)
Patient ID: Troy Mclaughlin, male   DOB: 07/16/1955, 57 y.o.   MRN: [id number] D. The patient spent all evening in bed. Stated that he was feeling weak. Later shared that he feels he will die if he returns home related to his drinking. He wants to go to a long term rehab, however, he does not want to go to [facility] in [place]. A. Encourage to attend evening A.A. group. Assessed for signs and symptoms of withdrawal. Administered medications. Discussed discharge plans. R. Did not attend evening group. C/o general malaise and feeling weak. Encouraged to keep hydrated and nourished. Offered Gatorade.

## 2012-08-04 NOTE — Progress Notes (Signed)
Adult Psychoeducational Group Note  Date:  08/04/2012 Time:  1315  Group Topic/Focus:  Healthy Coping Skills  Participation Level:  Did Not Attend  Pt unable to attend due to weakness.  Raeden Schippers Shari Prows 08/04/2012, 2:32 PM

## 2012-08-05 MED ORDER — ONDANSETRON HCL 4 MG PO TABS
8.0000 mg | ORAL_TABLET | Freq: Three times a day (TID) | ORAL | Status: DC | PRN
  Administered 2012-08-05: 8 mg via ORAL

## 2012-08-05 MED ORDER — HYDROXYZINE HCL 25 MG PO TABS
25.0000 mg | ORAL_TABLET | Freq: Four times a day (QID) | ORAL | Status: DC | PRN
  Administered 2012-08-09: 25 mg via ORAL
  Filled 2012-08-05: qty 30

## 2012-08-05 NOTE — Progress Notes (Signed)
[facility] Group Notes:  (Nursing/MHT/Case Management/Adjunct)  Date:  08/05/2012  Time:  11:18 AM  Type of Therapy:  Psychoeducational Skills  Participation Level:  None  Participation Quality:  Appropriate and Resistant  Affect:  Appropriate, Flat and Lethargic  Cognitive:  Appropriate  Insight:  Good  Engagement in Group:  None  Modes of Intervention:  Discussion and Support  Summary of Progress/Problems: Pts participated in non-denominational spirituality group by reading "Ending the Cycle" from the Sunday workbook. Pts read through paragraph by paragraph out loud then discussed items that they felt were relevant to them. Pt did appeared to listen during group but did not participate in group even when prompt. Pt stated "I am just taking in everything and listening."   Dalia Heading 08/05/2012, 11:18 AM

## 2012-08-05 NOTE — Progress Notes (Signed)
Patient did not attend the evening speaker AA meeting. Pt encouraged to go to group but remained in bed.

## 2012-08-05 NOTE — Progress Notes (Signed)
Adult Psychoeducational Group Note  Date:  08/05/2012 Time:  1300  Group Topic/Focus:  Healthy Support Systems  Participation Level:  Did Not Attend Pt decided not to attend group.  [name] Shari Prows 08/05/2012, 2:01 PM

## 2012-08-05 NOTE — Progress Notes (Signed)
Patient ID: Troy Mclaughlin, male   DOB: [birth year], 57 y.o.   MRN: [id number] D. The patient spent the evening resting in bed, mostly with his eyes closed. Stated he still feels weak and unable to participate in group or in the milieu. C/o mild nausea, no vomiting. A. Met with patient several times throughout the sift. Encouraged to attend evening substance abuse group. Reviewed medications.  R. The patient was able to tolerate ice cream and Gatorade for snack. Did not get out of bed except to use the bathroom.

## 2012-08-05 NOTE — Clinical Social Work Note (Signed)
[facility] Group Notes:  (Clinical Social Work)  08/05/2012  10:00-11:00AM  Summary of Progress/Problems:   The main focus of today's process group was to   identify the patient's current support system and decide on other supports that can be put in place to prevent future hospitalizations.  A handout was used to explain the 4 definitions/levels of support and to think about what support patient has given and received from others.  An emphasis was placed on using counselor, doctor, therapy groups, 12-step groups, and problem-specific support groups to expand supports. The patient expressed that he has 24 grandchildren aged 44 to 38 who support and love him.  He was in and out of the room.  He asked for help in replacing his ID and Social Security card which were lost prior to admission.  Type of Therapy:  Process Group with Motivational Interviewing  Participation Level:  Minimal  Participation Quality:  Attentive and Inattentive  Affect:  Blunted and Depressed  Cognitive:  Oriented  Insight:  Developing/Improving  Engagement in Therapy:  Developing/Improving  Modes of Intervention:  Clarification, Education, Limit-setting, Problem-solving, Socialization, Support and Processing, Exploration, Discussion, Role-Play   Ambrose Mantle, LCSW 08/05/2012, 12:11 PM

## 2012-08-05 NOTE — Progress Notes (Addendum)
Patient ID: QUANTEL MCINTURFF, male   DOB: [birth year], 56 y.o.   MRN: [id number] D: Pt is asleep in bed this AM. Pt denies SI/HI and A/V hallucinations. Pt rates their depression at 5 and hopelessness at 5. Pt's most recent CIWA score was 5. Pt mood is depressed and his affect is sad. Pt writes that he is "not going back to the same living situation, continue taking prescribed medication, AA meetings, counselor." Pt c/o macular degeneration and cataracts and needs assistance in completing self inventory.  A: Encouraged pt to discuss feelings with staff and administered medication per MD orders. Writer also encouraged pt to attend groups.  R: Pt is not attending groups but is tolerating medications well. Pt c/o weakness but is capable of participating in the milieu and going to meals. Writer will continue to monitor. 15 minute checks are ongoing for safety.

## 2012-08-05 NOTE — Progress Notes (Addendum)
Patient ID: Troy Mclaughlin, male   DOB: 1955/07/22, 57 y.o.   MRN: [id number] [facility] MD Progress Note  08/05/2012 2:18 PM Troy Mclaughlin  MRN:  [id number]  Subjective:  "I got too much in my mind. I'm worrying too much because I don't know what is going on. I was told that I might have to go to [facility] for substance abuse treatment. I don't want to go there. That is the crazy hospital in [place]. I have drinking problems but I'm not crazy. I also do not want to go home to my wife. She drinks way to much for me to be around her. I don't know what is going to happen to me".  O: Tried to explain to patient that the substance abuse treatment in question is not located at the [facility], rather in [place], [place] in [place].  Diagnosis:  Alcohol Dependence, Depressive Disorder NOS  ADL's:  Intact  Sleep: Poor  Appetite:  Poor  Suicidal Ideation:  Plan:  ideas, no plans Intent:  denies Means:  denies Homicidal Ideation:  Plan:  denies Intent:  denies Means:  denies AEB (as evidenced by):  Psychiatric Specialty Exam: Review of Systems  Constitutional: Positive for malaise/fatigue.  HENT: Negative.   Eyes: Negative.   Respiratory: Negative.   Cardiovascular: Negative.   Gastrointestinal: Positive for abdominal pain.  Genitourinary: Negative.   Musculoskeletal: Negative.   Skin: Negative.   Neurological: Positive for dizziness and weakness.  Endo/Heme/Allergies: Negative.   Psychiatric/Behavioral: Positive for depression and substance abuse. The patient is nervous/anxious and has insomnia.     Blood pressure 115/84, pulse 81, temperature 97.8 F (36.6 C), temperature source Oral, resp. rate 18, height 0' (0 m), weight 60.328 kg (133 lb).Cannot calculate BMI with a height equal to zero.  General Appearance: Disheveled  Eye Solicitor::  Fair  Speech:  Clear and Coherent, Slow and not spontnaneous  Volume:  Decreased  Mood:  Depressed and Hopeless, restless, worrying.   Affect:  Restricted  Thought Process:  Coherent and Goal Directed  Orientation:  Full (Time, Place, and Person)  Thought Content:  worries, concerns, restless  Suicidal Thoughts:  Yes.  without intent/plan  Homicidal Thoughts:  No  Memory:  Immediate;   Fair Recent;   Fair Remote;   Fair  Judgement:  Fair  Insight:  superficial  Psychomotor Activity:  Decreased  Concentration:  Fair  Recall:  Fair  Akathisia:  No  Handed:  Right  AIMS (if indicated):     Assets:  Desire for Improvement  Sleep:  Number of Hours: 6   Current Medications: Current Facility-Administered Medications  Medication Dose Route Frequency Provider Last Rate Last Dose  . acetaminophen (TYLENOL) tablet 650 mg  650 mg Oral Q6H PRN Sanjuana Kava, NP   650 mg at 08/04/12 0839  . alum & mag hydroxide-simeth (MAALOX/MYLANTA) 200-200-20 MG/5ML suspension 30 mL  30 mL Oral Q4H PRN Sanjuana Kava, NP      . feeding supplement (ENSURE COMPLETE) liquid 237 mL  237 mL Oral BID BM Rachael Fee, MD   237 mL at 08/05/12 1400  . hydrochlorothiazide (HYDRODIURIL) tablet 25 mg  25 mg Oral Daily Sanjuana Kava, NP   25 mg at 08/03/12 7829  . magnesium hydroxide (MILK OF MAGNESIA) suspension 30 mL  30 mL Oral Daily PRN Sanjuana Kava, NP   30 mL at 08/04/12 0837  . multivitamin with minerals tablet 1 tablet  1 tablet  Oral Daily Sanjuana Kava, NP   1 tablet at 08/05/12 0981  . nicotine (NICODERM CQ - dosed in mg/24 hours) patch 21 mg  21 mg Transdermal Q0600 Sanjuana Kava, NP   21 mg at 08/04/12 1914  . thiamine (B-1) injection 100 mg  100 mg Intramuscular Once Sanjuana Kava, NP      . thiamine (VITAMIN B-1) tablet 100 mg  100 mg Oral Daily Sanjuana Kava, NP   100 mg at 08/05/12 0804  . traZODone (DESYREL) tablet 100 mg  100 mg Oral QHS PRN Rachael Fee, MD   100 mg at 08/04/12 2148    Lab Results: No results found for this or any previous visit (from the past 48 hour(s)).  Physical Findings: AIMS: Facial and Oral  Movements Muscles of Facial Expression: None, normal Lips and Perioral Area: None, normal Jaw: None, normal Tongue: None, normal,Extremity Movements Upper (arms, wrists, hands, fingers): None, normal Lower (legs, knees, ankles, toes): None, normal, Trunk Movements Neck, shoulders, hips: None, normal, Overall Severity Severity of abnormal movements (highest score from questions above): None, normal Incapacitation due to abnormal movements: None, normal Patient's awareness of abnormal movements (rate only patient's report): No Awareness, Dental Status Current problems with teeth and/or dentures?: No Does patient usually wear dentures?: No  CIWA:  CIWA-Ar Total: 8 COWS:     Treatment Plan Summary: Daily contact with patient to assess and evaluate symptoms and progress in treatment Medication management  Plan: Supportive approach/coping skills/relapse prevention. Initiate Hydroxyzine 25 mg Qid PRN for anxiety. Continue Detox Reassess co morbidities, Administer Magnesium Citrate 1 bottle once. Start Neurontin 100 mg tid for anxiety. Proper therapeutic placement once discharged. Continue current treatment plan.  Medical Decision Making Problem Points:  Review of psycho-social stressors (1) Data Points:  Review of medication regiment & side effects (2)  I certify that inpatient services furnished can reasonably be expected to improve the patient's condition.   Armandina Stammer I 08/05/2012, 2:18 PM  Discussed with provider and agree with above.  Jacqulyn Cane, M.D.  08/05/2012 5:40 PM

## 2012-08-05 NOTE — Progress Notes (Signed)
Adult Psychoeducational Group Note  Date:  08/05/2012 Time:  1515  Group Topic/Focus:  Making Healthy Choices:   The focus of this group is to help patients identify negative/unhealthy choices they were using prior to admission and identify positive/healthier coping strategies to replace them upon discharge.  Participation Level:  Did Not Attend  Participation Quality:  Drowsy  Affect:    Cognitive:    Insight: None  Engagement in Group:    Modes of Intervention:    Additional Comments:    Casilda Carls 08/05/2012, 5:09 PM

## 2012-08-05 NOTE — Progress Notes (Addendum)
Pt states he feels very weak. BP rechecked and was 118/70, HR 88. Pt denies any chest pain but does have some swelling to both hands. NP to be made aware. Pt requested ice cream as stated that makes his stomach feel better. He was also encouraged to drink gatorade and given two cups. Pt was assisted to a chair while his bed was fixed and instructed to sit on the edge of his bed before attempting top get up. Pt also instructed to call the nurse when OOB to the bathroom.No pedal edema. Denies SI or HI and does contract for safety. Pt was given 8mg  of zofran for his nausea. Pt has a very poor appetite and states he has only felt like eating one complete meal while here. Pt concerned that he may have to go to a place in [place]. He thinks it might be connected with a prison and would rather stay in [place]. Pt is very pleasant-OOB and is eating a salad. States he has been with his wife since 1977 and she is a drinker and this is bad for him to be around her. He stated,"she brags about how much she can put away." pt is in his room isolative stating he feels weak. Vital signs are checked and appear within normal limits. Pt stated he used to live in [place] and wants to go back there. Pt did state years ago he had a colostomy and finds at times his stomach hurts.pt did state his mother dies here and his two sisters died here also.

## 2012-08-06 ENCOUNTER — Encounter (HOSPITAL_COMMUNITY): Payer: Self-pay | Admitting: Emergency Medicine

## 2012-08-06 MED ORDER — SODIUM CHLORIDE 0.9 % IV BOLUS (SEPSIS)
1000.0000 mL | Freq: Once | INTRAVENOUS | Status: AC
  Administered 2012-08-06: 1000 mL via INTRAVENOUS

## 2012-08-06 NOTE — Progress Notes (Signed)
Adult Psychoeducational Group Note  Date:  08/06/2012 Time:  11:09 AM  Group Topic/Focus:  Healthy Communication:   The focus of this group is to discuss communication, barriers to communication, as well as healthy ways to communicate with others.  Participation Level:  Active  Participation Quality:  limited  Affect:  Flat  Cognitive:  Confused  Insight: Limited  Engagement in Group:  Engaged and Limited  Modes of Intervention:  Discussion, Education and Support  Additional Comments:  Pt indicated that he needs to find goals for himself. Pt did not participate in group but was alert through the time.  Cornelius Schuitema T 08/06/2012, 11:09 AM

## 2012-08-06 NOTE — Progress Notes (Signed)
Patient ID: Troy Mclaughlin, male   DOB: [birth year], 57 y.o.   MRN: [id number] He was sent to [facility] ED at 11:15 and returned at 1345. Sent d/t increased weakness and dizziness sent for evaluation and treatment.    He has been up and interacting more this PM. He has c/o being a little weak and has layed down after supper.
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
## 2012-08-06 NOTE — Progress Notes (Signed)
D: Patient in bed resting on approach.  Patient states he was not going to attend group tonight because he did not feel up to it.  Patient states he is concerned about where he is going to go when he is discharged.  Patient states he is not opposed to going to a treatment program in [place] but states he is concerned about how he will get back home after the program is over.  Patient states, "If I go home I know I will die."  Patient states he wants to get his life togeter.  Patient states he has 14 grandchildren he wants to be around for.  Patient state he does not know what he is going to do because his wife drinks more than he does and patient states if he is around her he knows he will drink too.  APtient denies AI/HI and denies AVH.  A: Staff to monitor Q 15 mins for safety.  Encouragement and support offered.  No scheduled medications administered tonight.  Trazodone administered prn for insomnia.  Patient encouraged to attend group. R: Patient remains safe on the unit.  Patient did not attend group tonight. Patient taking administered medications

## 2012-08-06 NOTE — Progress Notes (Signed)
[facility] MD Progress Note  08/06/2012 5:17 PM Troy Mclaughlin  MRN:  [id number] Subjective:  Has not been able to eat that much. Does not feel good. Feels nauseated. He can only tolerate ice cream and Ensure some. He is drinking a lot of Gatorade, but still feels weak, dehydrated. (dry mouth, decrease skin turgor) He requested to be given fluids. He does not feel he can make it if he was not able to go to a long term rehab (more than 30 days) He was advised that most of the long term programs are religious based and that they want clients to be physically healthy to be able to work for them to pay their stay in that way. He endorses no support. He states that he would rather killed himself than go back to where he was. Diagnosis:  Alcohol Dependence/withdrawal, Major Depression  ADL's:  Intact  Sleep: Poor  Appetite:  Poor  Suicidal Ideation:  Plan:  ideas, no plans Intent:  denies Means:  denies Homicidal Ideation:  Plan:  denies Intent:  denies Means:  denies AEB (as evidenced by):  Psychiatric Specialty Exam: Review of Systems  Constitutional: Positive for diaphoresis.  HENT: Negative.   Eyes: Negative.   Respiratory: Negative.   Cardiovascular: Negative.   Gastrointestinal: Positive for nausea, vomiting and abdominal pain.  Genitourinary: Negative.   Musculoskeletal: Negative.   Skin: Negative.   Neurological: Negative.   Endo/Heme/Allergies: Negative.   Psychiatric/Behavioral: Positive for depression, suicidal ideas and substance abuse. The patient is nervous/anxious and has insomnia.     Blood pressure 124/81, pulse 70, temperature 97.9 F (36.6 C), temperature source Oral, resp. rate 16, height 0' (0 m), weight 60.328 kg (133 lb), SpO2 100.00%.Cannot calculate BMI with a height equal to zero.  General Appearance: Disheveled  Eye Contact::  Minimal  Speech:  Clear and Coherent, Slow and not spontaneous  Volume:  Decreased  Mood:  Anxious, Depressed and worried, weak   Affect:  Restricted  Thought Process:  Coherent and Goal Directed  Orientation:  Full (Time, Place, and Person)  Thought Content:  somatically focused  Suicidal Thoughts:  Yes.  without intent/plan  Homicidal Thoughts:  No  Memory:  Immediate;   Fair Recent;   Fair Remote;   Fair  Judgement:  Fair  Insight:  Shallow  Psychomotor Activity:  Psychomotor Retardation  Concentration:  Fair  Recall:  Fair  Akathisia:  No  Handed:  Right  AIMS (if indicated):     Assets:  Desire for Improvement  Sleep:  Number of Hours: 5.25   Current Medications: Current Facility-Administered Medications  Medication Dose Route Frequency Provider Last Rate Last Dose  . acetaminophen (TYLENOL) tablet 650 mg  650 mg Oral Q6H PRN Sanjuana Kava, NP   650 mg at 08/04/12 0839  . alum & mag hydroxide-simeth (MAALOX/MYLANTA) 200-200-20 MG/5ML suspension 30 mL  30 mL Oral Q4H PRN Sanjuana Kava, NP      . feeding supplement (ENSURE COMPLETE) liquid 237 mL  237 mL Oral BID BM Rachael Fee, MD   237 mL at 08/06/12 0831  . hydrochlorothiazide (HYDRODIURIL) tablet 25 mg  25 mg Oral Daily Sanjuana Kava, NP   25 mg at 08/06/12 0830  . hydrOXYzine (ATARAX/VISTARIL) tablet 25 mg  25 mg Oral QID PRN Sanjuana Kava, NP      . magnesium hydroxide (MILK OF MAGNESIA) suspension 30 mL  30 mL Oral Daily PRN Sanjuana Kava, NP   30 mL  at 08/04/12 0837  . multivitamin with minerals tablet 1 tablet  1 tablet Oral Daily Sanjuana Kava, NP   1 tablet at 08/06/12 0830  . nicotine (NICODERM CQ - dosed in mg/24 hours) patch 21 mg  21 mg Transdermal Q0600 Sanjuana Kava, NP   21 mg at 08/06/12 0831  . ondansetron (ZOFRAN) tablet 8 mg  8 mg Oral Q8H PRN Shuvon Rankin, NP   8 mg at 08/05/12 1617  . thiamine (B-1) injection 100 mg  100 mg Intramuscular Once Sanjuana Kava, NP      . thiamine (VITAMIN B-1) tablet 100 mg  100 mg Oral Daily Sanjuana Kava, NP   100 mg at 08/06/12 0830  . traZODone (DESYREL) tablet 100 mg  100 mg Oral QHS PRN Rachael Fee, MD   100 mg at 08/04/12 2148    Lab Results: No results found for this or any previous visit (from the past 48 hour(s)).  Physical Findings: AIMS: Facial and Oral Movements Muscles of Facial Expression: None, normal Lips and Perioral Area: None, normal Jaw: None, normal Tongue: None, normal,Extremity Movements Upper (arms, wrists, hands, fingers): None, normal Lower (legs, knees, ankles, toes): None, normal, Trunk Movements Neck, shoulders, hips: None, normal, Overall Severity Severity of abnormal movements (highest score from questions above): None, normal Incapacitation due to abnormal movements: None, normal Patient's awareness of abnormal movements (rate only patient's report): No Awareness, Dental Status Current problems with teeth and/or dentures?: No Does patient usually wear dentures?: No  CIWA:  CIWA-Ar Total: 0 COWS:     Treatment Plan Summary: Daily contact with patient to assess and evaluate symptoms and progress in treatment Medication management  Plan: Supportive approach/coping skills/relapse prevention           Will transfer to the ED and request IV hydration           Will monitor his food intake  Medical Decision Making Problem Points:  Review of psycho-social stressors (1) Data Points:  Review of medication regiment & side effects (2)  I certify that inpatient services furnished can reasonably be expected to improve the patient's condition.   Dwan Hemmelgarn A 08/06/2012, 5:17 PM

## 2012-08-06 NOTE — Progress Notes (Signed)
[facility] LCSW Group Therapy  08/06/2012 1:15 PM  Type of Therapy:  Group Therapy  Participation Level:  Did Not Attend; still not feeling well    Clide Dales 08/06/2012, 5:05 PM

## 2012-08-06 NOTE — ED Notes (Signed)
Pt brought over from [facility] with c/o weakness and loss of appetite, requesting that pt receive IV fluids.  Pt denies mental status change or confusion.

## 2012-08-07 MED ORDER — MIRTAZAPINE 15 MG PO TABS
15.0000 mg | ORAL_TABLET | Freq: Every day | ORAL | Status: DC
  Administered 2012-08-07 – 2012-08-08 (×2): 15 mg via ORAL
  Filled 2012-08-07: qty 14
  Filled 2012-08-07 (×2): qty 1
  Filled 2012-08-07: qty 14
  Filled 2012-08-07: qty 1

## 2012-08-07 MED ORDER — MENTHOL 3 MG MT LOZG: 1.0000 | LOZENGE | OROMUCOSAL | Status: DC | PRN

## 2012-08-07 MED ORDER — GABAPENTIN 100 MG PO CAPS
100.0000 mg | ORAL_CAPSULE | Freq: Three times a day (TID) | ORAL | Status: DC
  Administered 2012-08-07 – 2012-08-09 (×7): 100 mg via ORAL
  Filled 2012-08-07: qty 42
  Filled 2012-08-07: qty 1
  Filled 2012-08-07: qty 42
  Filled 2012-08-07 (×4): qty 1
  Filled 2012-08-07: qty 42
  Filled 2012-08-07: qty 1
  Filled 2012-08-07 (×2): qty 42
  Filled 2012-08-07 (×2): qty 1
  Filled 2012-08-07: qty 42
  Filled 2012-08-07: qty 1
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
## 2012-08-07 NOTE — Progress Notes (Signed)
D: Patient denies SI/HI and A/V hallucinations; patient reports sleep is fair; reports appetite is poor ; reports energy level is normal ; reports ability to pay attention is improving; rates depression as 5/10; rates hopelessness 0/10;  A: Monitored q 15 minutes; patient encouraged to attend groups; patient educated about medications; patient given medications per physician orders; patient encouraged to express feelings and/or concerns  R: Patient is cooperative and appears to have some insight; patient's interaction with staff and peers is appropriate; ; patient is taking medications as prescribed and tolerating medications; patient is attending all groups

## 2012-08-07 NOTE — Progress Notes (Signed)
[facility] LCSW Group Therapy  08/07/2012 1:15 PM  Type of Therapy:  Group Therapy 1:15 to 2:30   Participation Level:   Minimal  Participation Quality:  Attentive  Affect: Depressed and Flat  Cognitive:  Alert and Oriented   Insight:  None Shared  Engagement in Therapy:  Limited   Modes of Intervention:  Clarification, Exploration and Support  Summary of Progress/Problems: Warmup portion of group allowed patient's to choose from multiple emotions as to how they are feeling today. Emotions listed ranged from anger, denial, loneliness, hopeful, guilty, shamed, happiness, guilt and hopelessness. Patients were then asked if those same emotions might transfer into how they feel about their diagnosis. Troy Mclaughlin choose to remain quiet during group yet was attentive to peers.   Troy Mclaughlin

## 2012-08-07 NOTE — Progress Notes (Signed)
[facility] LCSW Aftercare Discharge Planning Group Note  08/07/2012 11:00 AM  Participation Quality:  Minimal  Affect:  Flat  Cognitive:  Alert and Oriented  Insight:  Limited  Engagement in Group:  Limited   Modes of Intervention:  Clarification, Exploration, Rapport Building, Socialization and Support  Summary of Progress/Problems: Pt denies both suicidal and homicidal ideation.  On a scale of 1 to 10 with ten being the most ever experienced, the patient rates depression at a 5 and anxiety at a 5. Patient relieved to know he has an admit date at [facility] yet continues to report he has no where to go before that.  CSW spoke about program at [facility] in [place] where men stay at [facility] a shelter.  Patient reports he has no desire to go to shelter.    Clide Dales
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
## 2012-08-07 NOTE — Progress Notes (Signed)
Recreation Therapy Notes  Date: 03.18.2014  Time: 3:00pm  Location: [facility] Art Room   Group Topic/Focus: Goal Setting   Participation Level:  Did not attend  Jearl Klinefelter, LRT/CTRS  Jearl Klinefelter 08/07/2012 3:53 PM

## 2012-08-07 NOTE — Progress Notes (Signed)
D: Patient in the hallway on approach.  Patient states his day was better today.  Patient states he is feels he is getting stronger.  Patient states in the past few days he has been weak.  Patient denies SI/HI and denies AVH.   A: Staff to monitor Q 15 mins for safety.  Encouragement and support offered.  Scheduled medications administered per orders.  Tylenol administered prn for a headache.   R: Patient remains safe on the unit.  Patient attended group tonight.  Patient calm, cooperative and taking administered medications.

## 2012-08-07 NOTE — Progress Notes (Signed)
[facility] LCSW Aftercare Discharge Planning Group Note  08/06/2012 8:45 AM  Participation Quality:  Appropriate  Affect:  Appropriate   Cognitive:  Alert and Oriented  Insight:  Developing/Improving  Engagement in Group:  Developing/Improving  Modes of Intervention:  Clarification, Exploration, Problem Solving, Socialization and Support  Summary of Progress/Problems: Pt denies both suicidal and homicidal ideation.  On a scale of 1 to 10 with ten being the most ever experienced, the patient rates depression at a 1 and anxiety at a 1. Patient reports desire to go to treatment from here, reports need for place to stay in between detox and treatment.  Problem solving with patient produced no viable options according to patient.    Clide Dales

## 2012-08-08 MED ORDER — HYDROXYZINE HCL 25 MG PO TABS: 25.0000 mg | ORAL_TABLET | Freq: Four times a day (QID) | ORAL | Status: DC | PRN

## 2012-08-08 MED ORDER — HYDROCHLOROTHIAZIDE 25 MG PO TABS: 25.0000 mg | ORAL_TABLET | Freq: Every day | ORAL | Status: DC

## 2012-08-08 MED ORDER — GABAPENTIN 100 MG PO CAPS: 100.0000 mg | ORAL_CAPSULE | Freq: Three times a day (TID) | ORAL | Status: DC

## 2012-08-08 MED ORDER — LISINOPRIL 20 MG PO TABS
20.0000 mg | ORAL_TABLET | Freq: Every day | ORAL | Status: DC
  Administered 2012-08-08 – 2012-08-09 (×2): 20 mg via ORAL
  Filled 2012-08-08 (×4): qty 1

## 2012-08-08 MED ORDER — MIRTAZAPINE 15 MG PO TABS: 15.0000 mg | ORAL_TABLET | Freq: Every day | ORAL | Status: DC

## 2012-08-08 NOTE — Progress Notes (Signed)
I was called for consultation on BP control. Pt is already on HCTZ and SBP range is 130 - 145 mmHg. Review of records indicate no renal insufficiency, no heart failure, and therefore it is reasonable to start with Lisinopril 20 mg QD. The medication also comes in combination form as Lisinopril-HCTZ and is listed under $4 drug list. Follow up on BP over next 2-3 days and if persistently higher than 140/90 may increase the dose of Lisinopril to 40 mg QD.  Debbora Presto, MD  [facility] Pager [phone] Cell [phone]  If 7PM-7AM, please contact night-coverage [email or web address] Password [email or web address]

## 2012-08-08 NOTE — Progress Notes (Signed)
Recreation Therapy Notes  Date: 03.19.2014 Time: 2:20pm Location: [address] Day Room      Group Topic/Focus: Animal Assist Activities/Therapy (AAA/T)  Participation Level: Did not attend  Additional Comments: Today's AAA/T session was a Animal Assisted Activities (AAA) session. Dog Team: Summer and handler.    Trust Leh L Naureen Benton, LRT/CTRS   Keshanna Riso L 08/08/2012 4:03 PM 
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
## 2012-08-08 NOTE — Progress Notes (Signed)
Psychoeducational Group Note  Date:  08/08/2012 Time:  2000  Group Topic/Focus:  Wrap-Up Group:   The focus of this group is to help patients review their daily goal of treatment and discuss progress on daily workbooks.  Participation Level: Did Not Attend  Participation Quality:  Not Applicable  Affect:  Not Applicable  Cognitive:  Not Applicable  Insight:  Not Applicable  Engagement in Group: Not Applicable  Additional Comments:  Pt did not attend.  Yasmen Cortner Lee 08/08/2012, 9:54 PM  

## 2012-08-08 NOTE — Tx Team (Addendum)
Interdisciplinary Treatment Plan Update (Adult)  Date: 08/08/2012  Time Reviewed: 10:21 AM   Progress in Treatment: Attending groups: Yes Participating in groups: Yes Taking medication as prescribed:  Yes Tolerating medication:  Yes Family/Significant othe contact made: Yes Patient understands diagnosis: Yes Discussing patient identified problems/goals with staff: Yes Medical problems stabilized or resolved:  Yes Denies suicidal/homicidal ideation: Yes Patient has not harmed self or Others: Yes  New problem(s) identified: None Identified  Discharge Plan or Barriers:  Patient has admit date for next week to [facility] for treatment plan. .   Additional comments: N/A  Reason for Continuation of Hospitalization: NA ~  Due MEDICAL ISSUES (Blood pressure readings) patient will not discharge today  Estimated length of stay: Discharging today ADDENDUM -Due MEDICAL ISSUES (Blood pressure readings) patient will not discharge today    For review of initial/current patient goals, please see plan of care.  Attendees: Patient:     Family:     Physician:  Geoffery Lyons 08/08/2012 10:21 AM   Nursing:  Roswell Miners, RN  08/08/2012 10:21 AM   Clinical Social Worker Ronda Fairly 08/08/2012 10:21 AM   Other:   Stephan Minister Liason 08/08/2012 10:21 AM   Other:  Concha Se, [place] PA Student 08/08/2012 10:21 AM   Other:  Robbie Louis, RN 08/08/2012 10:21 AM   Other:  Serena Colonel, PA  08/08/2012 10:21 AM    Scribe for Treatment Team:   Carney Bern, LCSWA  08/08/2012 10:21 AM

## 2012-08-08 NOTE — Progress Notes (Signed)
[facility] MD Progress Note  08/08/2012 5:11 PM Troy Mclaughlin  MRN:  [id number] Subjective:  Troy Mclaughlin endorses that he is still not feeling completely well. He has been able to tolerate food better. Still feels down,sad. He still feels uncertain about what is going to happen once he gets out of here. His BP is still not well controlled Diagnosis:  Alcohol Dependence/withdrawal, Depressive Disorder NOS  ADL's:  Intact  Sleep: Fair  Appetite:  Poor  Suicidal Ideation:  Plan:  denies Intent:  denies Means:  denies Homicidal Ideation:  Plan:  denies Intent:  denies Means:  denies AEB (as evidenced by):  Psychiatric Specialty Exam: Review of Systems  HENT: Negative.   Eyes: Negative.   Respiratory: Negative.   Cardiovascular: Negative.   Gastrointestinal:       Has been able to tolerate foods better  Genitourinary: Negative.   Musculoskeletal: Negative.   Skin: Negative.   Neurological: Positive for weakness.  Endo/Heme/Allergies: Negative.   Psychiatric/Behavioral: Positive for depression and substance abuse. The patient is nervous/anxious.     Blood pressure 145/93, pulse 96, temperature 97.4 F (36.3 C), temperature source Oral, resp. rate 22, height 0' (0 m), weight 60.328 kg (133 lb), SpO2 100.00%.Cannot calculate BMI with a height equal to zero.  General Appearance: Fairly Groomed  Patent attorney::  Fair  Speech:  Clear and Coherent and Slow  Volume:  Decreased  Mood:  Anxious and Depressed  Affect:  Restricted  Thought Process:  Coherent and Goal Directed  Orientation:  Full (Time, Place, and Person)  Thought Content:  worries, concerns  Suicidal Thoughts:  No  Homicidal Thoughts:  No  Memory:  Immediate;   Fair Recent;   Fair Remote;   Fair  Judgement:  Fair  Insight:  Present  Psychomotor Activity:  Decreased  Concentration:  Fair  Recall:  Fair  Akathisia:  No  Handed:  Right  AIMS (if indicated):     Assets:  Desire for Improvement  Sleep:  Number of Hours:  4.75   Current Medications: Current Facility-Administered Medications  Medication Dose Route Frequency Provider Last Rate Last Dose  . acetaminophen (TYLENOL) tablet 650 mg  650 mg Oral Q6H PRN Sanjuana Kava, NP   650 mg at 08/08/12 1618  . alum & mag hydroxide-simeth (MAALOX/MYLANTA) 200-200-20 MG/5ML suspension 30 mL  30 mL Oral Q4H PRN Sanjuana Kava, NP      . feeding supplement (ENSURE COMPLETE) liquid 237 mL  237 mL Oral BID BM Rachael Fee, MD   237 mL at 08/08/12 1313  . gabapentin (NEURONTIN) capsule 100 mg  100 mg Oral TID Sanjuana Kava, NP   100 mg at 08/08/12 1704  . hydrochlorothiazide (HYDRODIURIL) tablet 25 mg  25 mg Oral Daily Sanjuana Kava, NP   25 mg at 08/08/12 0831  . hydrOXYzine (ATARAX/VISTARIL) tablet 25 mg  25 mg Oral QID PRN Sanjuana Kava, NP      . lisinopril (PRINIVIL,ZESTRIL) tablet 20 mg  20 mg Oral Daily Dorothea Ogle, MD   20 mg at 08/08/12 1703  . magnesium hydroxide (MILK OF MAGNESIA) suspension 30 mL  30 mL Oral Daily PRN Sanjuana Kava, NP   30 mL at 08/04/12 0837  . menthol-cetylpyridinium (CEPACOL) lozenge 3 mg  1 lozenge Oral PRN Sanjuana Kava, NP      . mirtazapine (REMERON) tablet 15 mg  15 mg Oral QHS Sanjuana Kava, NP   15 mg at 08/07/12 2126  .  multivitamin with minerals tablet 1 tablet  1 tablet Oral Daily Sanjuana Kava, NP   1 tablet at 08/08/12 0831  . nicotine (NICODERM CQ - dosed in mg/24 hours) patch 21 mg  21 mg Transdermal Q0600 Sanjuana Kava, NP   21 mg at 08/08/12 1610  . ondansetron (ZOFRAN) tablet 8 mg  8 mg Oral Q8H PRN Shuvon Rankin, NP   8 mg at 08/05/12 1617  . thiamine (B-1) injection 100 mg  100 mg Intramuscular Once Sanjuana Kava, NP      . thiamine (VITAMIN B-1) tablet 100 mg  100 mg Oral Daily Sanjuana Kava, NP   100 mg at 08/08/12 0831    Lab Results: No results found for this or any previous visit (from the past 48 hour(s)).  Physical Findings: AIMS: Facial and Oral Movements Muscles of Facial Expression: None, normal Lips  and Perioral Area: None, normal Jaw: None, normal Tongue: None, normal,Extremity Movements Upper (arms, wrists, hands, fingers): None, normal Lower (legs, knees, ankles, toes): None, normal, Trunk Movements Neck, shoulders, hips: None, normal, Overall Severity Severity of abnormal movements (highest score from questions above): None, normal Incapacitation due to abnormal movements: None, normal Patient's awareness of abnormal movements (rate only patient's report): No Awareness, Dental Status Current problems with teeth and/or dentures?: No Does patient usually wear dentures?: No  CIWA:  CIWA-Ar Total: 0 COWS:     Treatment Plan Summary: Daily contact with patient to assess and evaluate symptoms and progress in treatment Medication management  Plan: Supportive approach/coping skills/relapse prevention           Optimize control of his BP           Get Internal Medicine recommendations about the BP Medical Decision Making Problem Points:  Review of psycho-social stressors (1) Data Points:  Review of medication regiment & side effects (2)  I certify that inpatient services furnished can reasonably be expected to improve the patient's condition.   Shawntee Mainwaring A 08/08/2012, 5:11 PM

## 2012-08-08 NOTE — Progress Notes (Signed)
Adult Psychoeducational Group Note  Date:  08/08/2012 Time:  6:47 PM  Group Topic/Focus:  Personal Choices and Values:   The focus of this group is to help patients assess and explore the importance of values in their lives, how their values affect their decisions, how they express their values and what opposes their expression.  Participation Level:  Did Not Attend  Additional Comments:  Pt was encouraged by staff to attend group, but pt refused.  Reinaldo Raddle K 08/08/2012, 6:47 PM

## 2012-08-08 NOTE — Progress Notes (Signed)
[facility] LCSW Aftercare Discharge Planning Group Note  08/08/2012 8:45 AM  Participation Quality:  Attentive and Sharing  Affect:  Appropriate  Cognitive:  Alert and Oriented  Insight:  Improving  Engagement in Group:  Improving  Modes of Intervention:  Clarification, Exploration, Socialization and Support  Summary of Progress/Problems: Troy Mclaughlin reports today that he feels "good to go" plan is to return home to wife until admit date for treatment bed at [facility].   Clide Dales

## 2012-08-08 NOTE — Progress Notes (Signed)
[facility] LCSW Group Therapy  08/08/2012 1:15 PM  Type of Therapy:  Group Therapy  Participation Level:  Minimal  Participation Quality:  Attentive  Affect:  Depressed and Flat  Cognitive:  Alert and Oriented  Insight:  None shared  Engagement in Therapy:  None  Modes of Intervention:  Discussion, Exploration and Support  Summary of Progress/Problems: Focus of group processing discussion was on balance in life; the components in life which have a negative influence on balance and the components which make for a more balanced life.  Patient shared that he was not feeling well (Blood pressure is erratic) thus not discharging; patient did stay for entire group session.   Clide Dales 08/08/2012, 7:05 PM

## 2012-08-08 NOTE — Progress Notes (Signed)
D:Patient in the hallway on approach.  Patient states he is supposed to be discharged tomorrow.  Patient states he does not know where he is going to go.  Patient states he is afraid because he thinks he has to go home.  Patient states he is afraid to go home because his wife continues to drink.  Patient denies SI/HI and denies AVH.   A: Staff to monitor Q 15 mins for safety.  Encouragement and support offered.  Scheduled medications administered per orders.   R: Patient remains safe on the unit.  Patient attended groups tonight.  Patient cooperative and taking administered medications.  Patient visible on the unit and interacting with peers.

## 2012-08-09 MED ORDER — LISINOPRIL 40 MG PO TABS: 40.0000 mg | ORAL_TABLET | Freq: Every day | ORAL | Status: DC

## 2012-08-09 MED ORDER — LISINOPRIL 20 MG PO TABS
40.0000 mg | ORAL_TABLET | Freq: Every day | ORAL | Status: DC
  Filled 2012-08-09: qty 2
  Filled 2012-08-09: qty 28

## 2012-08-09 MED ORDER — HYDROCHLOROTHIAZIDE 25 MG PO TABS: 25.0000 mg | ORAL_TABLET | Freq: Every day | ORAL | Status: DC

## 2012-08-09 MED ORDER — LISINOPRIL 20 MG PO TABS
20.0000 mg | ORAL_TABLET | Freq: Once | ORAL | Status: AC
  Administered 2012-08-09: 20 mg via ORAL
  Filled 2012-08-09: qty 1

## 2012-08-09 NOTE — Progress Notes (Signed)
[facility] LCSW Aftercare Discharge Planning Group Note  08/09/2012 8:45 AM  Participation Quality:  Appropriate  Affect:  Appropriate  Cognitive:  Alert and Oriented  Insight:  Developing/Improving  Engagement in Group:  Developing/Improving  Modes of Intervention:  Clarification, Exploration, Socialization and Support  Summary of Progress/Problems: Pt denies both suicidal and homicidal ideation.  On a scale of 1 to 10 with ten being the most ever experienced, the patient rates depression at a 3 and anxiety at a 3. Patient reports blood pressure still high yet feels okay for discharge as he was seen by another doctor. Patient would like to return home today. Troy Mclaughlin has March 27 admit date to [facility].    Clide Dales 08/09/2012, 9:59 AM

## 2012-08-09 NOTE — Progress Notes (Signed)
Adult Psychoeducational Group Note  Date:  08/09/2012 Time:  2:58 PM  Group Topic/Focus:  Overcoming Stress:   The focus of this group is to define stress and help patients assess their triggers.  Participation Level:  Did Not Attend   Additional Comments:  Pt did not attend group due to preparing for discharge.  Reinaldo Raddle K 08/09/2012, 2:58 PM

## 2012-08-09 NOTE — Progress Notes (Signed)
Patient ID: Troy Mclaughlin, male   DOB: 1955-11-23, 57 y.o.   MRN: [id number] He has been up for short periods of time today then went back to bed.  Stated he he was just waiting to see the DR. [name] He did go to one group.

## 2012-08-09 NOTE — Progress Notes (Signed)
[facility] Adult Case Management Discharge Plan :  Will you be returning to the same living situation after discharge: Yes,  home with wife At discharge, do you have transportation home?:Yes,  bus vouchers Do you have the ability to pay for your medications: NA  Release of information consent forms completed and in the chart;  Patient's signature needed at discharge.  Patient to Follow up at: Follow-up Information   Follow up with [facility] On 08/16/2012. (You have a bed at [facility] on 3/27; be in Lobby at 8 AM with medications and clothing needed for 28 day stay. )    Contact information:   [address] [place], [place]  [address] [facility] [phone]      Patient denies SI/HI:   Yes,  denies both    Safety Planning and Suicide Prevention discussed:  No. as patient was not suicidal at admit nor did patient develop suicidal ideation while here  Clide Dales 08/09/2012, 6:39 PM

## 2012-08-09 NOTE — Progress Notes (Signed)
Patient ID: Troy Mclaughlin, male   DOB: [birth year], 57 y.o.   MRN: [id number] He has been discharged ome and was provided bus passes to get there.  He voiced understanding of discharge instruction and of follow up plan. He denies thoughts of SI and all his belonging were taken home with him.

## 2012-08-09 NOTE — [facility] Suicide Risk Assessment (Signed)
Suicide Risk Assessment  Discharge Assessment     Demographic Factors:  Male  Mental Status Per Nursing Assessment::   On Admission:     Current Mental Status by Physician: In full contact with reality. There are no suicidal ideas, plans or intent. His mood is euthymic. Affect is appropriate. He is willing and motivated to pursue rehab at [facility]. He is going to pursue further management of his blood pressure   Loss Factors: Decline in physical health  Historical Factors: NA  Risk Reduction Factors:   NA  Continued Clinical Symptoms:  Alcohol/Substance Abuse/Dependencies  Cognitive Features That Contribute To Risk:  Closed-mindedness Thought constriction (tunnel vision)    Suicide Risk:  Minimal: No identifiable suicidal ideation.  Patients presenting with no risk factors but with morbid ruminations; may be classified as minimal risk based on the severity of the depressive symptoms  Discharge Diagnoses:   AXIS I:  Alcohol Dependence, Substance Induced Mood Disorder AXIS II:  Deferred AXIS III:   Past Medical History  Diagnosis Date  . Hypertension   . Alcohol abuse   . Stroke 1970    tia  . Glaucoma    AXIS IV:  other psychosocial or environmental problems AXIS V:  61-70 mild symptoms  Plan Of Care/Follow-up recommendations:  Activity:  as tolerated Diet:  regular Follow up [facility] Is patient on multiple antipsychotic therapies at discharge:  No   Has Patient had three or more failed trials of antipsychotic monotherapy by history:  No  Recommended Plan for Multiple Antipsychotic Therapies: N/A   Secily Walthour A 08/09/2012, 11:51 AM

## 2012-08-14 NOTE — Progress Notes (Signed)
Patient Discharge Instructions:  After Visit Summary (AVS):   Faxed to:  08/14/12 Discharge Summary Note:   Faxed to:  08/14/12 Psychiatric Admission Assessment Note:   Faxed to:  08/14/12 Suicide Risk Assessment - Discharge Assessment:   Faxed to:  08/14/12 Faxed/Sent to the Next Level Care provider:  08/14/12 Faxed to [facility] @ [phone]  Jerelene Redden, 08/14/2012, 3:56 PM

## 2012-11-06 ENCOUNTER — Telehealth (HOSPITAL_COMMUNITY): Payer: Self-pay | Admitting: *Deleted

## 2012-12-25 ENCOUNTER — Encounter (HOSPITAL_COMMUNITY): Payer: Self-pay | Admitting: Nurse Practitioner

## 2012-12-25 ENCOUNTER — Emergency Department (HOSPITAL_COMMUNITY)
Admission: EM | Admit: 2012-12-25 | Discharge: 2012-12-25 | Discharge status: Against Medical Advice | Payer: Self-pay | Attending: Emergency Medicine | Admitting: Emergency Medicine

## 2012-12-25 DIAGNOSIS — R109 Unspecified abdominal pain: Secondary | ICD-10-CM | POA: Insufficient documentation

## 2012-12-25 DIAGNOSIS — K920 Hematemesis: Secondary | ICD-10-CM | POA: Insufficient documentation

## 2012-12-25 DIAGNOSIS — R110 Nausea: Secondary | ICD-10-CM | POA: Insufficient documentation

## 2012-12-25 DIAGNOSIS — F17200 Nicotine dependence, unspecified, uncomplicated: Secondary | ICD-10-CM | POA: Insufficient documentation

## 2012-12-25 HISTORY — DX: Personal history of other diseases of the digestive system: Z87.19

## 2012-12-25 HISTORY — DX: Personal history of peptic ulcer disease: Z87.11

## 2012-12-25 NOTE — ED Notes (Addendum)
Per ems: pt called for n/v x 3 days, reports bright red blood noted in vomit. Reports L sided abd pain. States he has had this in the past and had ulcers. Pt had 2 40oz beers today. When he arrived to ED he states he wants detox from alcohol also.

## 2012-12-25 NOTE — ED Notes (Signed)
Pt stated he was leaving. 

## 2013-07-06 ENCOUNTER — Inpatient Hospital Stay (HOSPITAL_COMMUNITY)
Admission: EM | Admit: 2013-07-06 | Discharge: 2013-07-09 | DRG: 287 | Disposition: A | Discharge status: Home / Self Care | Payer: Self-pay | Attending: Cardiovascular Disease | Admitting: Cardiovascular Disease

## 2013-07-06 ENCOUNTER — Emergency Department (HOSPITAL_COMMUNITY): Payer: Self-pay

## 2013-07-06 ENCOUNTER — Encounter (HOSPITAL_COMMUNITY)
Admission: EM | Disposition: A | Discharge status: Home / Self Care | Payer: Self-pay | Source: Home / Self Care | Attending: Cardiovascular Disease

## 2013-07-06 ENCOUNTER — Encounter (HOSPITAL_COMMUNITY): Payer: Self-pay | Admitting: Emergency Medicine

## 2013-07-06 DIAGNOSIS — I209 Angina pectoris, unspecified: Secondary | ICD-10-CM | POA: Diagnosis present

## 2013-07-06 DIAGNOSIS — G8929 Other chronic pain: Secondary | ICD-10-CM | POA: Diagnosis present

## 2013-07-06 DIAGNOSIS — R109 Unspecified abdominal pain: Secondary | ICD-10-CM | POA: Diagnosis present

## 2013-07-06 DIAGNOSIS — I213 ST elevation (STEMI) myocardial infarction of unspecified site: Secondary | ICD-10-CM

## 2013-07-06 DIAGNOSIS — I4949 Other premature depolarization: Secondary | ICD-10-CM | POA: Diagnosis present

## 2013-07-06 DIAGNOSIS — Z720 Tobacco use: Secondary | ICD-10-CM | POA: Diagnosis present

## 2013-07-06 DIAGNOSIS — I319 Disease of pericardium, unspecified: Secondary | ICD-10-CM | POA: Diagnosis present

## 2013-07-06 DIAGNOSIS — R079 Chest pain, unspecified: Principal | ICD-10-CM

## 2013-07-06 DIAGNOSIS — R9431 Abnormal electrocardiogram [ECG] [EKG]: Secondary | ICD-10-CM | POA: Diagnosis present

## 2013-07-06 DIAGNOSIS — I2510 Atherosclerotic heart disease of native coronary artery without angina pectoris: Secondary | ICD-10-CM | POA: Diagnosis present

## 2013-07-06 DIAGNOSIS — F411 Generalized anxiety disorder: Secondary | ICD-10-CM | POA: Diagnosis present

## 2013-07-06 DIAGNOSIS — F1010 Alcohol abuse, uncomplicated: Secondary | ICD-10-CM | POA: Diagnosis present

## 2013-07-06 DIAGNOSIS — Z23 Encounter for immunization: Secondary | ICD-10-CM

## 2013-07-06 DIAGNOSIS — Z8711 Personal history of peptic ulcer disease: Secondary | ICD-10-CM

## 2013-07-06 DIAGNOSIS — A539 Syphilis, unspecified: Secondary | ICD-10-CM | POA: Diagnosis present

## 2013-07-06 DIAGNOSIS — Z8673 Personal history of transient ischemic attack (TIA), and cerebral infarction without residual deficits: Secondary | ICD-10-CM

## 2013-07-06 DIAGNOSIS — I219 Acute myocardial infarction, unspecified: Secondary | ICD-10-CM

## 2013-07-06 DIAGNOSIS — F1020 Alcohol dependence, uncomplicated: Secondary | ICD-10-CM | POA: Diagnosis present

## 2013-07-06 DIAGNOSIS — F17200 Nicotine dependence, unspecified, uncomplicated: Secondary | ICD-10-CM | POA: Diagnosis present

## 2013-07-06 DIAGNOSIS — I10 Essential (primary) hypertension: Secondary | ICD-10-CM | POA: Diagnosis present

## 2013-07-06 DIAGNOSIS — Z91199 Patient's noncompliance with other medical treatment and regimen due to unspecified reason: Secondary | ICD-10-CM

## 2013-07-06 DIAGNOSIS — Z79899 Other long term (current) drug therapy: Secondary | ICD-10-CM

## 2013-07-06 DIAGNOSIS — Z9119 Patient's noncompliance with other medical treatment and regimen: Secondary | ICD-10-CM

## 2013-07-06 DIAGNOSIS — I252 Old myocardial infarction: Secondary | ICD-10-CM | POA: Insufficient documentation

## 2013-07-06 HISTORY — DX: Abnormal electrocardiogram (ECG) (EKG): R94.31

## 2013-07-06 HISTORY — PX: LEFT HEART CATHETERIZATION WITH CORONARY ANGIOGRAM: SHX5451

## 2013-07-06 LAB — RAPID URINE DRUG SCREEN, HOSP PERFORMED
Amphetamines: NOT DETECTED
BARBITURATES: NOT DETECTED
Benzodiazepines: POSITIVE — AB
Cocaine: NOT DETECTED
Opiates: POSITIVE — AB
Tetrahydrocannabinol: NOT DETECTED

## 2013-07-06 LAB — BASIC METABOLIC PANEL
BUN: 6 mg/dL (ref 6–23)
CHLORIDE: 96 meq/L (ref 96–112)
CO2: 24 meq/L (ref 19–32)
CREATININE: 0.71 mg/dL (ref 0.50–1.35)
Calcium: 9.1 mg/dL (ref 8.4–10.5)
GFR calc non Af Amer: >90 mL/min (ref >90)
Glucose, Bld: 89 mg/dL (ref 70–99)
POTASSIUM: 3.9 meq/L (ref 3.7–5.3)
Sodium: 135 mEq/L — ABNORMAL LOW (ref 137–147)

## 2013-07-06 LAB — MRSA PCR SCREENING: MRSA BY PCR: NEGATIVE

## 2013-07-06 LAB — CBC
HCT: 41.3 % (ref 39.0–52.0)
Hemoglobin: 14.8 g/dL (ref 13.0–17.0)
MCH: 25.5 pg — ABNORMAL LOW (ref 26.0–34.0)
MCHC: 35.8 g/dL (ref 30.0–36.0)
MCV: 71.2 fL — AB (ref 78.0–100.0)
PLATELETS: 231 10*3/uL (ref 150–400)
RBC: 5.8 MIL/uL (ref 4.22–5.81)
RDW: 14.3 % (ref 11.5–15.5)
WBC: 10 10*3/uL (ref 4.0–10.5)

## 2013-07-06 LAB — PROTIME-INR
INR: 0.92 (ref 0.00–1.49)
PROTHROMBIN TIME: 12.2 s (ref 11.6–15.2)

## 2013-07-06 LAB — POCT I-STAT TROPONIN I: Troponin i, poc: 0 ng/mL (ref 0.00–0.08)

## 2013-07-06 LAB — PRO B NATRIURETIC PEPTIDE: PRO B NATRI PEPTIDE: 78.9 pg/mL (ref 0–125)

## 2013-07-06 SURGERY — LEFT HEART CATHETERIZATION WITH CORONARY ANGIOGRAM
Anesthesia: LOCAL

## 2013-07-06 MED ORDER — VERAPAMIL HCL 2.5 MG/ML IV SOLN
INTRAVENOUS | Status: AC
  Filled 2013-07-06: qty 2

## 2013-07-06 MED ORDER — HEPARIN SODIUM (PORCINE) 5000 UNIT/ML IJ SOLN
INTRAMUSCULAR | Status: AC
  Administered 2013-07-06: 4000 [IU]
  Filled 2013-07-06: qty 1

## 2013-07-06 MED ORDER — SODIUM CHLORIDE 0.9 % IJ SOLN: 3.0000 mL | INTRAMUSCULAR | Status: DC | PRN

## 2013-07-06 MED ORDER — MIDAZOLAM HCL 2 MG/2ML IJ SOLN
INTRAMUSCULAR | Status: AC
  Filled 2013-07-06: qty 2

## 2013-07-06 MED ORDER — MORPHINE SULFATE 4 MG/ML IJ SOLN
4.0000 mg | INTRAMUSCULAR | Status: DC | PRN
  Administered 2013-07-06: 4 mg via INTRAVENOUS
  Filled 2013-07-06: qty 1

## 2013-07-06 MED ORDER — ACETAMINOPHEN 325 MG PO TABS
650.0000 mg | ORAL_TABLET | ORAL | Status: DC | PRN
  Administered 2013-07-06: 650 mg via ORAL
  Filled 2013-07-06: qty 2

## 2013-07-06 MED ORDER — ONDANSETRON HCL 4 MG/2ML IJ SOLN
4.0000 mg | Freq: Four times a day (QID) | INTRAMUSCULAR | Status: DC | PRN
  Administered 2013-07-08 (×3): 4 mg via INTRAVENOUS
  Filled 2013-07-06 (×3): qty 2

## 2013-07-06 MED ORDER — BIVALIRUDIN 250 MG IV SOLR
INTRAVENOUS | Status: AC
  Filled 2013-07-06: qty 250

## 2013-07-06 MED ORDER — MORPHINE SULFATE 4 MG/ML IJ SOLN
INTRAMUSCULAR | Status: AC
  Filled 2013-07-06: qty 1

## 2013-07-06 MED ORDER — NITROGLYCERIN 0.4 MG SL SUBL
0.4000 mg | SUBLINGUAL_TABLET | SUBLINGUAL | Status: DC | PRN
  Administered 2013-07-06 (×2): 0.4 mg via SUBLINGUAL
  Filled 2013-07-06: qty 25

## 2013-07-06 MED ORDER — HEPARIN SODIUM (PORCINE) 1000 UNIT/ML IJ SOLN
INTRAMUSCULAR | Status: AC
  Filled 2013-07-06: qty 1

## 2013-07-06 MED ORDER — FENTANYL CITRATE 0.05 MG/ML IJ SOLN
INTRAMUSCULAR | Status: AC
  Filled 2013-07-06: qty 2

## 2013-07-06 MED ORDER — PNEUMOCOCCAL VAC POLYVALENT 25 MCG/0.5ML IJ INJ
0.5000 mL | INJECTION | INTRAMUSCULAR | Status: AC
  Administered 2013-07-07: 0.5 mL via INTRAMUSCULAR
  Filled 2013-07-06: qty 0.5

## 2013-07-06 MED ORDER — HYDROCHLOROTHIAZIDE 25 MG PO TABS
25.0000 mg | ORAL_TABLET | Freq: Every day | ORAL | Status: DC
  Administered 2013-07-06 – 2013-07-09 (×4): 25 mg via ORAL
  Filled 2013-07-06 (×4): qty 1

## 2013-07-06 MED ORDER — SODIUM CHLORIDE 0.9 % IJ SOLN
3.0000 mL | Freq: Two times a day (BID) | INTRAMUSCULAR | Status: DC
  Administered 2013-07-07 – 2013-07-08 (×3): 3 mL via INTRAVENOUS

## 2013-07-06 MED ORDER — ASPIRIN 81 MG PO CHEW: 324.0000 mg | CHEWABLE_TABLET | Freq: Once | ORAL | Status: DC

## 2013-07-06 MED ORDER — INFLUENZA VAC SPLIT QUAD 0.5 ML IM SUSP
0.5000 mL | INTRAMUSCULAR | Status: AC
  Administered 2013-07-07: 0.5 mL via INTRAMUSCULAR
  Filled 2013-07-06: qty 0.5

## 2013-07-06 MED ORDER — SODIUM CHLORIDE 0.9 % IV SOLN: 250.0000 mL | INTRAVENOUS | Status: DC | PRN

## 2013-07-06 MED ORDER — NITROGLYCERIN 0.4 MG SL SUBL
0.4000 mg | SUBLINGUAL_TABLET | SUBLINGUAL | Status: DC | PRN
  Administered 2013-07-06: 0.4 mg via SUBLINGUAL

## 2013-07-06 MED ORDER — MORPHINE SULFATE 2 MG/ML IJ SOLN
2.0000 mg | INTRAMUSCULAR | Status: DC | PRN
  Administered 2013-07-06 – 2013-07-08 (×7): 2 mg via INTRAVENOUS
  Filled 2013-07-06 (×7): qty 1

## 2013-07-06 MED ORDER — SODIUM CHLORIDE 0.9 % IV SOLN: 1.0000 mL/kg/h | INTRAVENOUS | Status: AC

## 2013-07-06 MED ORDER — ASPIRIN EC 81 MG PO TBEC
81.0000 mg | DELAYED_RELEASE_TABLET | Freq: Every day | ORAL | Status: DC
  Administered 2013-07-07 – 2013-07-09 (×3): 81 mg via ORAL
  Filled 2013-07-06 (×3): qty 1

## 2013-07-06 MED ORDER — LISINOPRIL 40 MG PO TABS
40.0000 mg | ORAL_TABLET | Freq: Every day | ORAL | Status: DC
  Administered 2013-07-06 – 2013-07-09 (×4): 40 mg via ORAL
  Filled 2013-07-06 (×4): qty 1

## 2013-07-06 MED ORDER — HEPARIN BOLUS VIA INFUSION: 4000.0000 [IU] | Freq: Once | INTRAVENOUS | Status: DC

## 2013-07-06 MED ORDER — HEPARIN (PORCINE) IN NACL 100-0.45 UNIT/ML-% IJ SOLN: 12.0000 [IU]/kg/h | Freq: Once | INTRAMUSCULAR | Status: DC
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
## 2013-07-06 NOTE — ED Notes (Signed)
PT trans. To Cath Lab

## 2013-07-06 NOTE — ED Notes (Signed)
Cardiology at bedside.

## 2013-07-06 NOTE — ED Notes (Signed)
PT reports CP started 0530 today. Pt reports CP on rt chest radiating into ABD and neck. EMS started 2 IV's and gave 324 ASA along with 3 SL nitro . CP 0/10  On arrival TO ED.

## 2013-07-06 NOTE — CV Procedure (Signed)
    Cardiac Catheterization Procedure Note  Name: Troy Mclaughlin MRN: [id number] DOB: Feb 06, 1956  Procedure: Left Heart Cath, Selective Coronary Angiography, LV angiography  Indication: Anterior STEMI   Procedural Details: The right wrist was prepped, draped, and anesthetized with 1% lidocaine. Using the modified Seldinger technique, a 5/6 French sheath was introduced into the right radial artery. 3 mg of verapamil was administered through the sheath, weight-based unfractionated heparin was administered intravenously. Standard Judkins catheters were used for selective coronary angiography and left ventriculography. An XB-LAD guide catheter was used for imaging of the LCA. Catheter exchanges were performed over an exchange length guidewire. There were no immediate procedural complications. A TR band was used for radial hemostasis at the completion of the procedure.  The patient was transferred to the post catheterization recovery area for further monitoring.  Procedural Findings: Hemodynamics: AO 132/82 LV 132/2  Coronary angiography: Coronary dominance: right  Left mainstem: widely patent  Left anterior descending (LAD): large vessel, wraps the LV apex. No significant obstructive disease throughout the LAD distribution. Diagonal branches are patent.   Left circumflex (LCx): large vessel. Minor nonobstructive stenosis in the proximal vessel. The OM branches are patent.   Right coronary artery (RCA): dominant vessel, medium in caliber. The mid vessel has a 75% stenosis. The PDA and PLA branches are patent.   Left ventriculography: Left ventricular systolic function is vigorous, LVEF is estimated at 70%, there is no significant mitral regurgitation   Final Conclusions:   1. Moderate mid-RCA stenosis 2. Widely patent LAD and LCx stenosis 3. Vigorous LV function  Recommendations: Unclear clinical picture. There is no critical stenosis and no obstructive disease at all in the LAD  territory in the patient with a pattern of anterior injury on EKG. Diff dx includes LVH, pericarditis, coronary spasm. Will check an echo in am, cycle enzymes, and treat medically.  Sherren Mocha 07/06/2013, 6:06 PM

## 2013-07-06 NOTE — ED Notes (Signed)
MD at bedside. 
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
## 2013-07-06 NOTE — Progress Notes (Signed)
TR BAND REMOVAL  LOCATION:    right radial  DEFLATED PER PROTOCOL:    yes  TIME BAND OFF / DRESSING APPLIED:    20:30   SITE UPON ARRIVAL:    Level 0  SITE AFTER BAND REMOVAL:    Level 0  REVERSE ALLEN'S TEST:     positive  CIRCULATION SENSATION AND MOVEMENT:    Within Normal Limits   yes  COMMENTS:

## 2013-07-07 ENCOUNTER — Inpatient Hospital Stay (HOSPITAL_COMMUNITY): Payer: Self-pay

## 2013-07-07 DIAGNOSIS — R079 Chest pain, unspecified: Principal | ICD-10-CM

## 2013-07-07 DIAGNOSIS — R9431 Abnormal electrocardiogram [ECG] [EKG]: Secondary | ICD-10-CM | POA: Insufficient documentation

## 2013-07-07 DIAGNOSIS — I517 Cardiomegaly: Secondary | ICD-10-CM

## 2013-07-07 LAB — BASIC METABOLIC PANEL
BUN: 8 mg/dL (ref 6–23)
CHLORIDE: 99 meq/L (ref 96–112)
CO2: 26 meq/L (ref 19–32)
CREATININE: 0.89 mg/dL (ref 0.50–1.35)
Calcium: 9.3 mg/dL (ref 8.4–10.5)
GFR calc Af Amer: >90 mL/min (ref >90)
GFR calc non Af Amer: >90 mL/min (ref >90)
GLUCOSE: 78 mg/dL (ref 70–99)
Potassium: 3.9 mEq/L (ref 3.7–5.3)
Sodium: 137 mEq/L (ref 137–147)

## 2013-07-07 LAB — LIPID PANEL
CHOL/HDL RATIO: 2.4 ratio
CHOLESTEROL: 191 mg/dL (ref 0–200)
HDL: 79 mg/dL (ref >39)
LDL Cholesterol: 99 mg/dL (ref 0–99)
Triglycerides: 67 mg/dL (ref <150)
VLDL: 13 mg/dL (ref 0–40)

## 2013-07-07 LAB — CBC
HEMATOCRIT: 41.8 % (ref 39.0–52.0)
HEMOGLOBIN: 14.5 g/dL (ref 13.0–17.0)
MCH: 24.8 pg — ABNORMAL LOW (ref 26.0–34.0)
MCHC: 34.7 g/dL (ref 30.0–36.0)
MCV: 71.6 fL — AB (ref 78.0–100.0)
Platelets: 222 10*3/uL (ref 150–400)
RBC: 5.84 MIL/uL — AB (ref 4.22–5.81)
RDW: 14.4 % (ref 11.5–15.5)
WBC: 6.3 10*3/uL (ref 4.0–10.5)

## 2013-07-07 LAB — TROPONIN I
Troponin I: <0.3 ng/mL (ref <0.30)
Troponin I: <0.3 ng/mL (ref <0.30)

## 2013-07-07 LAB — GLUCOSE, CAPILLARY
Glucose-Capillary: 102 mg/dL — ABNORMAL HIGH (ref 70–99)
Glucose-Capillary: 89 mg/dL (ref 70–99)

## 2013-07-07 LAB — D-DIMER, QUANTITATIVE (NOT AT ARMC): D-Dimer, Quant: 2.68 ug/mL-FEU — ABNORMAL HIGH (ref 0.00–0.48)

## 2013-07-07 MED ORDER — PANTOPRAZOLE SODIUM 40 MG PO TBEC
40.0000 mg | DELAYED_RELEASE_TABLET | Freq: Every day | ORAL | Status: DC
  Administered 2013-07-07 – 2013-07-09 (×3): 40 mg via ORAL
  Filled 2013-07-07 (×3): qty 1

## 2013-07-07 MED ORDER — DEXMEDETOMIDINE HCL IN NACL 200 MCG/50ML IV SOLN: 0.2000 ug/kg/h | INTRAVENOUS | Status: DC

## 2013-07-07 MED ORDER — IOHEXOL 350 MG/ML SOLN
100.0000 mL | Freq: Once | INTRAVENOUS | Status: AC | PRN
  Administered 2013-07-07: 100 mL via INTRAVENOUS

## 2013-07-07 MED ORDER — TRAMADOL HCL 50 MG PO TABS
50.0000 mg | ORAL_TABLET | Freq: Four times a day (QID) | ORAL | Status: DC | PRN
  Administered 2013-07-07 – 2013-07-09 (×7): 50 mg via ORAL
  Filled 2013-07-07 (×7): qty 1

## 2013-07-07 MED ORDER — LORAZEPAM 2 MG/ML IJ SOLN: 1.0000 mg | INTRAMUSCULAR | Status: DC | PRN

## 2013-07-07 NOTE — Progress Notes (Signed)
At approximately 21:55 pt c/o of chest pain with pain score of 10/10. O2 @ 2L/min via [place] given. BP=187/129; HR=92 Ntg SL given x 2 doses. BP=117/82 after 2nd dose of Ntg. SL. EKG shows acute MI. Dr. Percival Spanish informed. Morphine 2 mg IV given @ 23:14. Pt was able to rest & asleep in 15 mins. Will continue to monitor pt.
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
## 2013-07-07 NOTE — Progress Notes (Signed)
  Echocardiogram 2D Echocardiogram has been performed.  Troy Mclaughlin 07/07/2013, 9:28 AM

## 2013-07-08 ENCOUNTER — Inpatient Hospital Stay (HOSPITAL_COMMUNITY): Payer: Self-pay

## 2013-07-08 ENCOUNTER — Encounter (HOSPITAL_COMMUNITY): Payer: Self-pay | Admitting: *Deleted

## 2013-07-08 DIAGNOSIS — F17200 Nicotine dependence, unspecified, uncomplicated: Secondary | ICD-10-CM

## 2013-07-08 MED ORDER — IOHEXOL 300 MG/ML  SOLN
25.0000 mL | INTRAMUSCULAR | Status: AC
  Administered 2013-07-08 (×2): 25 mL via ORAL

## 2013-07-08 MED ORDER — IOHEXOL 300 MG/ML  SOLN
100.0000 mL | Freq: Once | INTRAMUSCULAR | Status: AC | PRN
  Administered 2013-07-08: 100 mL via INTRAVENOUS

## 2013-07-08 MED ORDER — GI COCKTAIL ~~LOC~~
30.0000 mL | Freq: Two times a day (BID) | ORAL | Status: DC | PRN
Start: 2013-07-08 — End: 2013-07-09
  Administered 2013-07-08: 30 mL via ORAL
  Filled 2013-07-08 (×2): qty 30

## 2013-07-08 NOTE — Progress Notes (Signed)
UR completed. Clotee Schlicker RN CCM Case Mgmt phone [phone] 
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
## 2013-07-08 NOTE — Progress Notes (Signed)
Pt admitted from [facility] with chest pain due to [facility]. Alert and oriented x4, IV to RAC and LAC (2/14). Skin Intact. Non tele. Oriented to room and call bell system. Low fall risk. Will continue to monitor. [place], [employer]

## 2013-07-09 ENCOUNTER — Encounter (HOSPITAL_COMMUNITY): Payer: Self-pay | Admitting: Physician Assistant

## 2013-07-09 DIAGNOSIS — F1020 Alcohol dependence, uncomplicated: Secondary | ICD-10-CM

## 2013-07-09 DIAGNOSIS — I10 Essential (primary) hypertension: Secondary | ICD-10-CM | POA: Diagnosis present

## 2013-07-09 DIAGNOSIS — R9431 Abnormal electrocardiogram [ECG] [EKG]: Secondary | ICD-10-CM | POA: Diagnosis present

## 2013-07-09 MED ORDER — TRAZODONE 25 MG HALF TABLET
25.0000 mg | ORAL_TABLET | Freq: Once | ORAL | Status: AC
Start: 2013-07-09 — End: 2013-07-09
  Administered 2013-07-09: 25 mg via ORAL
  Filled 2013-07-09: qty 1

## 2013-07-09 MED ORDER — PANTOPRAZOLE SODIUM 40 MG PO TBEC: 40.0000 mg | DELAYED_RELEASE_TABLET | Freq: Every day | ORAL | Status: DC

## 2013-07-09 MED ORDER — LISINOPRIL 40 MG PO TABS: 40.0000 mg | ORAL_TABLET | Freq: Every day | ORAL | Status: DC

## 2013-07-09 MED ORDER — HYDROCHLOROTHIAZIDE 25 MG PO TABS: 25.0000 mg | ORAL_TABLET | Freq: Every day | ORAL | Status: DC

## 2013-07-09 MED ORDER — ASPIRIN 81 MG PO TBEC: 81.0000 mg | DELAYED_RELEASE_TABLET | Freq: Every day | ORAL | Status: DC

## 2013-07-09 NOTE — Progress Notes (Signed)
BP 105/69, HR 80 this am at 1117, held off giving HCTZ and lisinopril until discussed with MD/PA.  Call placed to [place] [phone]) at this time, repeating BP as requested and to recall to PA.

## 2013-07-09 NOTE — Discharge Instructions (Signed)
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
## 2013-07-09 NOTE — Progress Notes (Addendum)
Pt d/c instructions reviewed with him. Copy of instructions given to pt. Pt d/c'd via wheelchair with belongings with staff escort, daughter at entrance waiting.

## 2013-07-09 NOTE — Progress Notes (Signed)
Notified Dr. Colon Flattery that pt requesting sleep medication. Pt states that he keeps waking up. MD placed order in epic. Will continue to monitor patient. Ranelle Oyster, RN
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
## 2013-07-09 NOTE — Progress Notes (Signed)
Repeat BP was 120/79 PA Joellen Jersey was notified, given the okay to give BP meds at this time, to f/u BP later and CM to assist with PCP, CM notified and will come up to see pt.
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
## 2013-07-11 ENCOUNTER — Encounter: Payer: Self-pay | Admitting: Cardiovascular Disease

## 2013-07-22 ENCOUNTER — Encounter: Payer: Self-pay | Admitting: *Deleted

## 2013-07-24 ENCOUNTER — Encounter: Payer: Self-pay | Admitting: Physician Assistant

## 2013-07-24 ENCOUNTER — Ambulatory Visit (INDEPENDENT_AMBULATORY_CARE_PROVIDER_SITE_OTHER): Payer: Self-pay | Admitting: Physician Assistant

## 2013-07-24 VITALS — BP 178/95 | HR 71 | Ht 69.0 in | Wt 148.0 lb

## 2013-07-24 DIAGNOSIS — R079 Chest pain, unspecified: Secondary | ICD-10-CM

## 2013-07-24 DIAGNOSIS — Z720 Tobacco use: Secondary | ICD-10-CM

## 2013-07-24 DIAGNOSIS — I2510 Atherosclerotic heart disease of native coronary artery without angina pectoris: Secondary | ICD-10-CM

## 2013-07-24 DIAGNOSIS — F17200 Nicotine dependence, unspecified, uncomplicated: Secondary | ICD-10-CM

## 2013-07-24 DIAGNOSIS — I10 Essential (primary) hypertension: Secondary | ICD-10-CM

## 2013-07-24 NOTE — Patient Instructions (Addendum)
Your physician recommends that you schedule a follow-up appointment in: [place], PAC  You have been referred to [place]; PT NEEDS TO BE SEEN IN 1-2 WEEKS ASAP DUE TO NEEDS TO GET HIS MEDICATIONS FOR BP
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
## 2013-07-25 ENCOUNTER — Telehealth: Payer: Self-pay | Admitting: Physician Assistant

## 2013-07-25 NOTE — Telephone Encounter (Signed)
New message    Patient calling was seen on yesterday - given a number to call for assistance with medication . Has some questions and concerns.

## 2013-07-25 NOTE — Telephone Encounter (Signed)
I cb pt about his meds. I explained he has to see the PCP 1st @ [place] then they will be able to get his meds filled for him, pt has appt 07/27/13 Saturday. Pt said he needs to get a ride. I advised make sure to go to appt due to eleavted BP, pt said ok.

## 2013-07-25 NOTE — Telephone Encounter (Signed)
I cb pt about his meds. I explained he has to see the PCP 1st @ [facility] then they will be able to get his meds filled for him, pt has appt 07/27/13 Saturday. Pt said he needs to get a ride. I advised make sure to go to appt due to eleavted BP, pt said ok. 

## 2013-07-27 ENCOUNTER — Ambulatory Visit: Payer: Self-pay

## 2013-08-08 ENCOUNTER — Telehealth: Payer: Self-pay | Admitting: Cardiovascular Disease

## 2013-08-08 NOTE — Telephone Encounter (Signed)
New message     Patient calling c/o chest pain now - a little . Headache. Pain level scale 2.  No nitro taken.

## 2013-08-08 NOTE — Telephone Encounter (Signed)
Spoke with patient and he had an episode of chest pain that lasted about 20 minutes, resolved with rest. Pain 1-2 on a scale of 1-10, 10 being most painful. Patient has had chest pain twice since last office visit with [facility] 07/24/13. Patient c/o of headaches several times a week. Patient is still not taking any of his medications secondary to not having the money. Discussed with Brynda Rim PA and he recommended urgent care or ED. Tried to call patient, voice mail not set up. Will try again later

## 2013-08-08 NOTE — Telephone Encounter (Signed)
Still unable to reach patient, will try again tomorrow

## 2013-08-09 NOTE — Telephone Encounter (Signed)
Spoke with patient and gave him recommendations of [place] since patient needs to get his blood pressure under control and does not have any means of getting his medication. Patient states he is feeling better today. Strongly encouraged patient to try to get his medication as soon as possible, he is in the process of trying to get Medicaid. Patient stated he would go back to ED if any other problems. Will forward to [place] and Dr Burt Knack

## 2013-10-24 ENCOUNTER — Ambulatory Visit: Payer: Self-pay | Admitting: Physician Assistant

## 2013-11-08 ENCOUNTER — Encounter: Payer: Self-pay | Admitting: Physician Assistant

## 2013-11-08 ENCOUNTER — Telehealth: Payer: Self-pay

## 2013-11-08 ENCOUNTER — Ambulatory Visit (INDEPENDENT_AMBULATORY_CARE_PROVIDER_SITE_OTHER): Payer: Self-pay | Admitting: Physician Assistant

## 2013-11-08 VITALS — BP 150/110 | HR 66 | Ht 69.0 in | Wt 153.0 lb

## 2013-11-08 DIAGNOSIS — H3400 Transient retinal artery occlusion, unspecified eye: Secondary | ICD-10-CM

## 2013-11-08 DIAGNOSIS — F17200 Nicotine dependence, unspecified, uncomplicated: Secondary | ICD-10-CM

## 2013-11-08 DIAGNOSIS — I2510 Atherosclerotic heart disease of native coronary artery without angina pectoris: Secondary | ICD-10-CM

## 2013-11-08 DIAGNOSIS — I10 Essential (primary) hypertension: Secondary | ICD-10-CM

## 2013-11-08 DIAGNOSIS — Z720 Tobacco use: Secondary | ICD-10-CM

## 2013-11-08 DIAGNOSIS — G453 Amaurosis fugax: Secondary | ICD-10-CM

## 2013-11-08 MED ORDER — OLMESARTAN MEDOXOMIL-HCTZ 20-12.5 MG PO TABS: 1.0000 | ORAL_TABLET | Freq: Every day | ORAL | Status: DC

## 2013-11-08 MED ORDER — ROSUVASTATIN CALCIUM 10 MG PO TABS: 10.0000 mg | ORAL_TABLET | Freq: Every day | ORAL | Status: DC

## 2013-11-08 NOTE — Patient Instructions (Addendum)
START ASPIRIN 81 MG DAILY START BENICAR 20/12.5 MG 1 TABLET DAILY (SAMPLES) START CRESTOR 10 MG 1 TABLET DAILY (SAMPLES)  LAB WORK IN 2 WEEKS FOR BMET  YOU WILL NEED FASTING CHOLESTEROL PANEL IN 2 MONTHS  Your physician has requested that you have a carotid duplex. This test is an ultrasound of the carotid arteries in your neck. It looks at blood flow through these arteries that supply the brain with blood. Allow one hour for this exam. There are no restrictions or special instructions.  You have been referred to [facility]  YOU WILL NEED A NURSE VISIT IN 2 WEEKS FOR BLOOD PRESSURE CHECK   Your physician wants you to follow-up in: [place], [place] .  You will receive a reminder letter in the mail two months in advance. If you don't receive a letter, please call our office to schedule the follow-up appointment.
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
## 2013-11-08 NOTE — Telephone Encounter (Signed)
Pt needs an Est. Care Visit. Pt is a self pay at this time

## 2013-11-19 ENCOUNTER — Telehealth: Payer: Self-pay | Admitting: General Practice

## 2013-11-19 NOTE — Telephone Encounter (Signed)
Left voicemail for patient to call to establish care.

## 2013-11-25 ENCOUNTER — Encounter (HOSPITAL_COMMUNITY): Payer: Self-pay

## 2013-11-25 ENCOUNTER — Other Ambulatory Visit: Payer: Self-pay

## 2013-11-29 ENCOUNTER — Telehealth: Payer: Self-pay | Admitting: *Deleted

## 2013-11-29 ENCOUNTER — Ambulatory Visit (HOSPITAL_COMMUNITY): Payer: Self-pay | Attending: Physician Assistant | Admitting: Cardiology

## 2013-11-29 ENCOUNTER — Ambulatory Visit: Payer: Self-pay

## 2013-11-29 DIAGNOSIS — F17200 Nicotine dependence, unspecified, uncomplicated: Secondary | ICD-10-CM | POA: Insufficient documentation

## 2013-11-29 DIAGNOSIS — G453 Amaurosis fugax: Secondary | ICD-10-CM

## 2013-11-29 DIAGNOSIS — I2510 Atherosclerotic heart disease of native coronary artery without angina pectoris: Secondary | ICD-10-CM | POA: Insufficient documentation

## 2013-11-29 DIAGNOSIS — H53129 Transient visual loss, unspecified eye: Secondary | ICD-10-CM

## 2013-11-29 DIAGNOSIS — H53122 Transient visual loss, left eye: Secondary | ICD-10-CM

## 2013-11-29 DIAGNOSIS — H3400 Transient retinal artery occlusion, unspecified eye: Secondary | ICD-10-CM | POA: Insufficient documentation

## 2013-11-29 DIAGNOSIS — I10 Essential (primary) hypertension: Secondary | ICD-10-CM | POA: Insufficient documentation

## 2013-11-29 NOTE — Progress Notes (Signed)
Carotid duplex performed 

## 2013-11-29 NOTE — Telephone Encounter (Signed)
lmom carotids good, no plaque, no stenosis. Advised to f/u with eye doc per Brynda Rim. PA

## 2013-12-25 ENCOUNTER — Ambulatory Visit: Payer: Self-pay

## 2014-01-08 ENCOUNTER — Other Ambulatory Visit (INDEPENDENT_AMBULATORY_CARE_PROVIDER_SITE_OTHER): Payer: Self-pay

## 2014-01-08 DIAGNOSIS — I10 Essential (primary) hypertension: Secondary | ICD-10-CM

## 2014-01-09 LAB — LIPID PANEL
Cholesterol: 137 mg/dL (ref 0–200)
HDL: 51.8 mg/dL (ref >39.00)
LDL CALC: 69 mg/dL (ref 0–99)
NonHDL: 85.2
TRIGLYCERIDES: 80 mg/dL (ref 0.0–149.0)
Total CHOL/HDL Ratio: 3
VLDL: 16 mg/dL (ref 0.0–40.0)

## 2014-01-09 LAB — BASIC METABOLIC PANEL
BUN: 10 mg/dL (ref 6–23)
CALCIUM: 8.8 mg/dL (ref 8.4–10.5)
CHLORIDE: 102 meq/L (ref 96–112)
CO2: 29 mEq/L (ref 19–32)
CREATININE: 1 mg/dL (ref 0.4–1.5)
GFR: 102.73 mL/min (ref >60.00)
Glucose, Bld: 87 mg/dL (ref 70–99)
Potassium: 4.1 mEq/L (ref 3.5–5.1)
Sodium: 137 mEq/L (ref 135–145)

## 2014-01-09 LAB — HEPATIC FUNCTION PANEL
ALK PHOS: 77 U/L (ref 39–117)
ALT: 18 U/L (ref 0–53)
AST: 19 U/L (ref 0–37)
Albumin: 3.6 g/dL (ref 3.5–5.2)
BILIRUBIN DIRECT: 0.1 mg/dL (ref 0.0–0.3)
TOTAL PROTEIN: 7.2 g/dL (ref 6.0–8.3)
Total Bilirubin: 0.3 mg/dL (ref 0.2–1.2)

## 2014-02-24 ENCOUNTER — Telehealth: Payer: Self-pay | Admitting: Physician Assistant

## 2014-02-24 ENCOUNTER — Other Ambulatory Visit: Payer: Self-pay | Admitting: *Deleted

## 2014-02-24 DIAGNOSIS — I10 Essential (primary) hypertension: Secondary | ICD-10-CM

## 2014-02-24 MED ORDER — ROSUVASTATIN CALCIUM 10 MG PO TABS
10.0000 mg | ORAL_TABLET | Freq: Every day | ORAL | Status: DC
Start: 2014-02-24 — End: 2014-08-25

## 2014-02-24 NOTE — Telephone Encounter (Signed)
New problem:    Per pt needs CRESTOR called in to [facility] on [address] please today he is out.

## 2014-02-25 ENCOUNTER — Telehealth: Payer: Self-pay

## 2014-02-25 NOTE — Telephone Encounter (Signed)
Patient called to get samples of crestor and benicar. i also talked to him about the patient assistance program . I placed forms with his samples up front

## 2014-02-28 NOTE — Telephone Encounter (Deleted)
New Prob    Pt is applying for aid to cover medication. Calling to see if pt has ever had adverse events to Benicar HCT. Please call.

## 2014-02-28 NOTE — Telephone Encounter (Signed)
New Prob ? ?  Pt is applying for aid to cover medication. Calling to see if pt has ever had adverse events to Benicar HCT. Please call.

## 2014-03-03 ENCOUNTER — Telehealth: Payer: Self-pay | Admitting: *Deleted

## 2014-03-03 NOTE — Telephone Encounter (Signed)
called pt to ask about his question if any adverse event to the benicar-hct. Pt states he did not call 10/9 and ask this question. He just started the benicar-hct. I did tell him it looks like his crestor was sent in 10/5. Pt states he does not have $300 to get medication and that we have been giving him samples. He states he is applying for assistance with his medications. I told pt that once he has financial assistance to let us know so we can send in refills to the pharmacy for him. Pt said ok and thank you for my call today.

## 2014-03-18 ENCOUNTER — Other Ambulatory Visit: Payer: Self-pay

## 2014-03-18 DIAGNOSIS — I10 Essential (primary) hypertension: Secondary | ICD-10-CM

## 2014-03-18 MED ORDER — OLMESARTAN MEDOXOMIL-HCTZ 20-12.5 MG PO TABS: 1.0000 | ORAL_TABLET | Freq: Every day | ORAL | Status: DC

## 2014-04-04 ENCOUNTER — Telehealth: Payer: Self-pay | Admitting: General Practice

## 2014-04-04 NOTE — Telephone Encounter (Signed)
Expand All Collapse All   Pt is requesting a refill on his rosuvastatin (CRESTOR) 10 MG tablet and his lisinopril. Pt believes he came in to apply for medication assistance but can not remember clearly and wanted to clarify that he cant pay for his meds. Please follow up with pt.         This is not our pt

## 2014-04-04 NOTE — Telephone Encounter (Signed)
Pt is requesting a refill on his rosuvastatin (CRESTOR) 10 MG tablet and his lisinopril. Pt believes he came in to apply for medication assistance but can not remember clearly and wanted to clarify that he cant pay for his meds. Please follow up with pt.

## 2014-04-22 ENCOUNTER — Telehealth: Payer: Self-pay

## 2014-04-22 ENCOUNTER — Telehealth: Payer: Self-pay | Admitting: Physician Assistant

## 2014-04-22 NOTE — Telephone Encounter (Signed)
Patient called for samples of benicar 20-12.5 placed up front

## 2014-05-01 ENCOUNTER — Encounter (HOSPITAL_COMMUNITY): Payer: Self-pay | Admitting: Cardiovascular Disease

## 2014-05-12 ENCOUNTER — Encounter: Payer: Self-pay | Admitting: Physician Assistant

## 2014-05-12 ENCOUNTER — Ambulatory Visit (INDEPENDENT_AMBULATORY_CARE_PROVIDER_SITE_OTHER): Payer: Self-pay | Admitting: Physician Assistant

## 2014-05-12 VITALS — BP 140/102 | HR 86 | Ht 69.0 in | Wt 159.0 lb

## 2014-05-12 DIAGNOSIS — E785 Hyperlipidemia, unspecified: Secondary | ICD-10-CM

## 2014-05-12 DIAGNOSIS — I2510 Atherosclerotic heart disease of native coronary artery without angina pectoris: Secondary | ICD-10-CM

## 2014-05-12 DIAGNOSIS — Z720 Tobacco use: Secondary | ICD-10-CM

## 2014-05-12 DIAGNOSIS — I10 Essential (primary) hypertension: Secondary | ICD-10-CM

## 2014-05-12 MED ORDER — OLMESARTAN MEDOXOMIL-HCTZ 40-25 MG PO TABS: 1.0000 | ORAL_TABLET | Freq: Every day | ORAL | Status: DC
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
## 2014-05-12 NOTE — Patient Instructions (Signed)
LAB WORK 12/28, BMET  INCREASE BENICAR TO 40/25 MG Daily  Your physician wants you to follow-up in: 6 months with Richardson Dopp, PA You will receive a reminder letter in the mail two months in advance. If you don't receive a letter, please call our office to schedule the follow-up appointment.\

## 2014-05-17 ENCOUNTER — Emergency Department (HOSPITAL_COMMUNITY): Payer: Medicaid Other

## 2014-05-17 ENCOUNTER — Encounter (HOSPITAL_COMMUNITY): Payer: Self-pay | Admitting: *Deleted

## 2014-05-17 ENCOUNTER — Inpatient Hospital Stay (HOSPITAL_COMMUNITY)
Admission: EM | Admit: 2014-05-17 | Discharge: 2014-05-19 | DRG: 313 | Disposition: A | Discharge status: Home / Self Care | Payer: Medicaid Other | Attending: Cardiovascular Disease | Admitting: Cardiovascular Disease

## 2014-05-17 DIAGNOSIS — R402252 Coma scale, best verbal response, oriented, at arrival to emergency department: Secondary | ICD-10-CM | POA: Diagnosis present

## 2014-05-17 DIAGNOSIS — H409 Unspecified glaucoma: Secondary | ICD-10-CM | POA: Diagnosis present

## 2014-05-17 DIAGNOSIS — Z7982 Long term (current) use of aspirin: Secondary | ICD-10-CM

## 2014-05-17 DIAGNOSIS — Z8249 Family history of ischemic heart disease and other diseases of the circulatory system: Secondary | ICD-10-CM

## 2014-05-17 DIAGNOSIS — Z823 Family history of stroke: Secondary | ICD-10-CM | POA: Diagnosis not present

## 2014-05-17 DIAGNOSIS — I2510 Atherosclerotic heart disease of native coronary artery without angina pectoris: Secondary | ICD-10-CM | POA: Diagnosis present

## 2014-05-17 DIAGNOSIS — R402362 Coma scale, best motor response, obeys commands, at arrival to emergency department: Secondary | ICD-10-CM | POA: Diagnosis present

## 2014-05-17 DIAGNOSIS — Z8673 Personal history of transient ischemic attack (TIA), and cerebral infarction without residual deficits: Secondary | ICD-10-CM | POA: Diagnosis not present

## 2014-05-17 DIAGNOSIS — F17210 Nicotine dependence, cigarettes, uncomplicated: Secondary | ICD-10-CM | POA: Diagnosis present

## 2014-05-17 DIAGNOSIS — R42 Dizziness and giddiness: Secondary | ICD-10-CM

## 2014-05-17 DIAGNOSIS — Z8711 Personal history of peptic ulcer disease: Secondary | ICD-10-CM

## 2014-05-17 DIAGNOSIS — I252 Old myocardial infarction: Secondary | ICD-10-CM

## 2014-05-17 DIAGNOSIS — R0789 Other chest pain: Secondary | ICD-10-CM | POA: Insufficient documentation

## 2014-05-17 DIAGNOSIS — Z79899 Other long term (current) drug therapy: Secondary | ICD-10-CM

## 2014-05-17 DIAGNOSIS — R079 Chest pain, unspecified: Secondary | ICD-10-CM | POA: Diagnosis present

## 2014-05-17 DIAGNOSIS — I10 Essential (primary) hypertension: Secondary | ICD-10-CM | POA: Diagnosis present

## 2014-05-17 DIAGNOSIS — Z9114 Patient's other noncompliance with medication regimen: Secondary | ICD-10-CM | POA: Diagnosis present

## 2014-05-17 DIAGNOSIS — F1010 Alcohol abuse, uncomplicated: Secondary | ICD-10-CM | POA: Diagnosis present

## 2014-05-17 DIAGNOSIS — R402142 Coma scale, eyes open, spontaneous, at arrival to emergency department: Secondary | ICD-10-CM | POA: Diagnosis present

## 2014-05-17 DIAGNOSIS — F17200 Nicotine dependence, unspecified, uncomplicated: Secondary | ICD-10-CM | POA: Diagnosis present

## 2014-05-17 DIAGNOSIS — Z720 Tobacco use: Secondary | ICD-10-CM | POA: Diagnosis present

## 2014-05-17 DIAGNOSIS — F329 Major depressive disorder, single episode, unspecified: Secondary | ICD-10-CM | POA: Diagnosis present

## 2014-05-17 LAB — BASIC METABOLIC PANEL
ANION GAP: 7 (ref 5–15)
BUN: 17 mg/dL (ref 6–23)
CALCIUM: 9.1 mg/dL (ref 8.4–10.5)
CO2: 25 mmol/L (ref 19–32)
Chloride: 104 mEq/L (ref 96–112)
Creatinine, Ser: 1.23 mg/dL (ref 0.50–1.35)
GFR calc Af Amer: 74 mL/min — ABNORMAL LOW (ref >90)
GFR, EST NON AFRICAN AMERICAN: 64 mL/min — AB (ref >90)
Glucose, Bld: 82 mg/dL (ref 70–99)
Potassium: 3.7 mmol/L (ref 3.5–5.1)
SODIUM: 136 mmol/L (ref 135–145)

## 2014-05-17 LAB — I-STAT TROPONIN, ED: TROPONIN I, POC: 0 ng/mL (ref 0.00–0.08)

## 2014-05-17 LAB — CBC
HCT: 41.4 % (ref 39.0–52.0)
Hemoglobin: 14.2 g/dL (ref 13.0–17.0)
MCH: 24.4 pg — AB (ref 26.0–34.0)
MCHC: 34.3 g/dL (ref 30.0–36.0)
MCV: 71.3 fL — ABNORMAL LOW (ref 78.0–100.0)
PLATELETS: 212 10*3/uL (ref 150–400)
RBC: 5.81 MIL/uL (ref 4.22–5.81)
RDW: 14.6 % (ref 11.5–15.5)
WBC: 5.1 10*3/uL (ref 4.0–10.5)

## 2014-05-17 LAB — HEPATIC FUNCTION PANEL
ALBUMIN: 3.5 g/dL (ref 3.5–5.2)
ALT: 17 U/L (ref 0–53)
AST: 21 U/L (ref 0–37)
Alkaline Phosphatase: 72 U/L (ref 39–117)
Bilirubin, Direct: <0.1 mg/dL (ref 0.0–0.3)
Total Bilirubin: 0.2 mg/dL — ABNORMAL LOW (ref 0.3–1.2)
Total Protein: 6.4 g/dL (ref 6.0–8.3)

## 2014-05-17 LAB — MAGNESIUM: MAGNESIUM: 1.9 mg/dL (ref 1.5–2.5)

## 2014-05-17 LAB — HEPARIN LEVEL (UNFRACTIONATED): HEPARIN UNFRACTIONATED: 0.49 [IU]/mL (ref 0.30–0.70)

## 2014-05-17 LAB — TSH: TSH: 0.547 u[IU]/mL (ref 0.350–4.500)

## 2014-05-17 LAB — TROPONIN I: Troponin I: <0.03 ng/mL (ref <0.031)

## 2014-05-17 LAB — PROTIME-INR
INR: 1.1 (ref 0.00–1.49)
PROTHROMBIN TIME: 14.4 s (ref 11.6–15.2)

## 2014-05-17 MED ORDER — OLMESARTAN MEDOXOMIL-HCTZ 40-25 MG PO TABS: 1.0000 | ORAL_TABLET | Freq: Every day | ORAL | Status: DC

## 2014-05-17 MED ORDER — ASPIRIN EC 81 MG PO TBEC
81.0000 mg | DELAYED_RELEASE_TABLET | Freq: Every day | ORAL | Status: DC
  Administered 2014-05-18 – 2014-05-19 (×2): 81 mg via ORAL
  Filled 2014-05-17 (×2): qty 1

## 2014-05-17 MED ORDER — HEPARIN (PORCINE) IN NACL 100-0.45 UNIT/ML-% IJ SOLN
850.0000 [IU]/h | INTRAMUSCULAR | Status: DC
  Administered 2014-05-17 – 2014-05-18 (×2): 850 [IU]/h via INTRAVENOUS
  Filled 2014-05-17 (×2): qty 250

## 2014-05-17 MED ORDER — HEPARIN BOLUS VIA INFUSION
4000.0000 [IU] | Freq: Once | INTRAVENOUS | Status: AC
  Administered 2014-05-17: 4000 [IU] via INTRAVENOUS
  Filled 2014-05-17: qty 4000

## 2014-05-17 MED ORDER — HYDROCHLOROTHIAZIDE 25 MG PO TABS
25.0000 mg | ORAL_TABLET | Freq: Every day | ORAL | Status: DC
  Administered 2014-05-18 – 2014-05-19 (×2): 25 mg via ORAL
  Filled 2014-05-17 (×2): qty 1

## 2014-05-17 MED ORDER — HYDROCHLOROTHIAZIDE 25 MG PO TABS: 25.0000 mg | ORAL_TABLET | Freq: Every day | ORAL | Status: DC

## 2014-05-17 MED ORDER — IRBESARTAN 150 MG PO TABS: 300.0000 mg | ORAL_TABLET | Freq: Every day | ORAL | Status: DC

## 2014-05-17 MED ORDER — ONDANSETRON HCL 4 MG/2ML IJ SOLN: 4.0000 mg | Freq: Four times a day (QID) | INTRAMUSCULAR | Status: DC | PRN

## 2014-05-17 MED ORDER — SODIUM CHLORIDE 0.9 % IV SOLN
INTRAVENOUS | Status: DC
  Administered 2014-05-17: 17:00:00 via INTRAVENOUS
  Administered 2014-05-18: 50 mL via INTRAVENOUS

## 2014-05-17 MED ORDER — NITROGLYCERIN 0.4 MG SL SUBL: 0.4000 mg | SUBLINGUAL_TABLET | SUBLINGUAL | Status: DC | PRN

## 2014-05-17 MED ORDER — ASPIRIN 81 MG PO CHEW
324.0000 mg | CHEWABLE_TABLET | ORAL | Status: AC
  Administered 2014-05-17: 324 mg via ORAL
  Filled 2014-05-17: qty 4

## 2014-05-17 MED ORDER — IRBESARTAN 150 MG PO TABS
300.0000 mg | ORAL_TABLET | Freq: Every day | ORAL | Status: DC
  Administered 2014-05-18 – 2014-05-19 (×2): 300 mg via ORAL
  Filled 2014-05-17 (×2): qty 2

## 2014-05-17 MED ORDER — ASPIRIN EC 81 MG PO TBEC: 81.0000 mg | DELAYED_RELEASE_TABLET | Freq: Every day | ORAL | Status: DC

## 2014-05-17 MED ORDER — NICOTINE 14 MG/24HR TD PT24
14.0000 mg | MEDICATED_PATCH | TRANSDERMAL | Status: DC
  Administered 2014-05-17 – 2014-05-18 (×2): 14 mg via TRANSDERMAL
  Filled 2014-05-17 (×2): qty 1

## 2014-05-17 MED ORDER — METOPROLOL TARTRATE 12.5 MG HALF TABLET
12.5000 mg | ORAL_TABLET | Freq: Two times a day (BID) | ORAL | Status: DC
  Administered 2014-05-18 – 2014-05-19 (×3): 12.5 mg via ORAL
  Filled 2014-05-17 (×4): qty 1

## 2014-05-17 MED ORDER — ZOLPIDEM TARTRATE 5 MG PO TABS
5.0000 mg | ORAL_TABLET | Freq: Every evening | ORAL | Status: DC | PRN
  Administered 2014-05-17 – 2014-05-18 (×2): 5 mg via ORAL
  Filled 2014-05-17 (×2): qty 1

## 2014-05-17 MED ORDER — ALPRAZOLAM 0.25 MG PO TABS
0.2500 mg | ORAL_TABLET | Freq: Two times a day (BID) | ORAL | Status: DC | PRN
Start: 2014-05-17 — End: 2014-05-19

## 2014-05-17 MED ORDER — ASPIRIN 300 MG RE SUPP: 300.0000 mg | RECTAL | Status: AC

## 2014-05-17 MED ORDER — ROSUVASTATIN CALCIUM 10 MG PO TABS
10.0000 mg | ORAL_TABLET | Freq: Every day | ORAL | Status: DC
  Administered 2014-05-18 – 2014-05-19 (×2): 10 mg via ORAL
  Filled 2014-05-17 (×2): qty 1

## 2014-05-17 MED ORDER — KETOROLAC TROMETHAMINE 30 MG/ML IJ SOLN
30.0000 mg | Freq: Four times a day (QID) | INTRAMUSCULAR | Status: DC
  Administered 2014-05-17 – 2014-05-19 (×8): 30 mg via INTRAVENOUS
  Filled 2014-05-17 (×8): qty 1

## 2014-05-17 MED ORDER — ACETAMINOPHEN 325 MG PO TABS
650.0000 mg | ORAL_TABLET | ORAL | Status: DC | PRN
  Administered 2014-05-17 – 2014-05-18 (×2): 650 mg via ORAL
  Filled 2014-05-17 (×2): qty 2

## 2014-05-17 NOTE — ED Notes (Signed)
Pt reports left side sharp intermittent pains that started weeks ago, radiates around to left side of chest. Also having recent vision changes, mild headache and dizziness. Hx of MI and stroke. ekg done at triage and no acute distress noted. Airway intact.

## 2014-05-17 NOTE — Progress Notes (Signed)
ANTICOAGULATION CONSULT NOTE - Initial Consult  Pharmacy Consult for Heparin Indication: chest pain/ACS  No Known Allergies  Patient Measurements: Height: 5\' 9"  (175.3 cm) Weight: 159 lb (72.122 kg) IBW/kg (Calculated) : 70.7 Heparin Dosing Weight: 72 kg  Vital Signs: Temp: 97.7 F (36.5 C) (12/26 1125) Temp Source: Oral (12/26 1125) BP: 138/92 mmHg (12/26 1617) Pulse Rate: 57 (12/26 1617)  Labs:  Recent Labs  05/17/14 1203  HGB 14.2  HCT 41.4  PLT 212  CREATININE 1.23    Estimated Creatinine Clearance: 67.1 mL/min (by C-G formula based on Cr of 1.23).   Medical History: Past Medical History  Diagnosis Date  . Hypertension   . Alcohol abuse   . Stroke 1970    tia  . Glaucoma   . History of stomach ulcers   . ST elevation     Medications:  Prescriptions prior to admission  Medication Sig Dispense Refill Last Dose  . aspirin EC 81 MG EC tablet Take 1 tablet (81 mg total) by mouth daily.   05/16/2014 at Unknown time  . olmesartan-hydrochlorothiazide (BENICAR HCT) 40-25 MG per tablet Take 1 tablet by mouth daily.   05/17/2014 at Unknown time  . rosuvastatin (CRESTOR) 10 MG tablet Take 1 tablet (10 mg total) by mouth daily. 60 tablet 1 05/17/2014 at Unknown time   Scheduled:  . aspirin  324 mg Oral NOW   Or  . aspirin  300 mg Rectal NOW  . [START ON 05/18/2014] aspirin EC  81 mg Oral Daily  . [START ON 05/18/2014] irbesartan  300 mg Oral Daily   And  . [START ON 05/18/2014] hydrochlorothiazide  25 mg Oral Daily  . ketorolac  30 mg Intravenous 4 times per day  . metoprolol tartrate  12.5 mg Oral BID  . [START ON 05/18/2014] rosuvastatin  10 mg Oral Daily   Infusions:  . sodium chloride      Assessment: 58yo male presents with chest pain. Pharmacy is consulted to dose heparin for ACS/chest pain. Hgb 14.2, Plt 212, trop neg.  Goal of Therapy:  Heparin level 0.3-0.7 units/ml Monitor platelets by anticoagulation protocol: Yes   Plan:  Give 4000 units  bolus x 1 Start heparin infusion at 850 units/hr Check anti-Xa level in 6 hours and daily while on heparin Continue to monitor H&H and platelets  Monitor s/sx of bleeding  Andrey Cota. Diona Foley, PharmD Clinical Pharmacist Pager [phone] 05/17/2014,4:21 PM

## 2014-05-17 NOTE — ED Notes (Signed)
Cardiology MD at bedside.

## 2014-05-17 NOTE — ED Notes (Signed)
Pt continues to c/o pain in chest rate of 5 on scale of 0/10.  Cecilie Kicks NP made aware, st's she will change admission to stepdown.
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
## 2014-05-18 LAB — LIPID PANEL
CHOLESTEROL: 141 mg/dL (ref 0–200)
HDL: 39 mg/dL — AB (ref >39)
LDL Cholesterol: 93 mg/dL (ref 0–99)
Total CHOL/HDL Ratio: 3.6 RATIO
Triglycerides: 45 mg/dL (ref <150)
VLDL: 9 mg/dL (ref 0–40)

## 2014-05-18 LAB — BASIC METABOLIC PANEL
ANION GAP: 8 (ref 5–15)
BUN: 18 mg/dL (ref 6–23)
CALCIUM: 8.9 mg/dL (ref 8.4–10.5)
CHLORIDE: 107 meq/L (ref 96–112)
CO2: 21 mmol/L (ref 19–32)
Creatinine, Ser: 1.15 mg/dL (ref 0.50–1.35)
GFR calc Af Amer: 80 mL/min — ABNORMAL LOW (ref >90)
GFR calc non Af Amer: 69 mL/min — ABNORMAL LOW (ref >90)
Glucose, Bld: 98 mg/dL (ref 70–99)
Potassium: 3.9 mmol/L (ref 3.5–5.1)
SODIUM: 136 mmol/L (ref 135–145)

## 2014-05-18 LAB — CBC
HEMATOCRIT: 40 % (ref 39.0–52.0)
Hemoglobin: 13.3 g/dL (ref 13.0–17.0)
MCH: 24.1 pg — ABNORMAL LOW (ref 26.0–34.0)
MCHC: 33.3 g/dL (ref 30.0–36.0)
MCV: 72.3 fL — ABNORMAL LOW (ref 78.0–100.0)
Platelets: 191 10*3/uL (ref 150–400)
RBC: 5.53 MIL/uL (ref 4.22–5.81)
RDW: 14.7 % (ref 11.5–15.5)
WBC: 3.8 10*3/uL — AB (ref 4.0–10.5)

## 2014-05-18 LAB — T4, FREE: Free T4: 0.67 ng/dL — ABNORMAL LOW (ref 0.80–1.80)

## 2014-05-18 LAB — TROPONIN I
Troponin I: <0.03 ng/mL (ref <0.031)
Troponin I: <0.03 ng/mL (ref <0.031)

## 2014-05-18 LAB — HEMOGLOBIN A1C
Hgb A1c MFr Bld: 6.2 % — ABNORMAL HIGH (ref <5.7)
Mean Plasma Glucose: 131 mg/dL — ABNORMAL HIGH (ref <117)

## 2014-05-18 LAB — HEPARIN LEVEL (UNFRACTIONATED): Heparin Unfractionated: 0.45 IU/mL (ref 0.30–0.70)

## 2014-05-18 NOTE — Progress Notes (Signed)
ANTICOAGULATION CONSULT NOTE - Follow Up Consult  Pharmacy Consult for heparin Indication: chest pain/ACS  No Known Allergies  Patient Measurements: Height: 5\' 9"  (175.3 cm) Weight: 156 lb 9.6 oz (71.033 kg) IBW/kg (Calculated) : 70.7 Heparin Dosing Weight: 71kg  Vital Signs: Temp: 97.8 F (36.6 C) (12/27 0533) Temp Source: Oral (12/27 0533) BP: 132/89 mmHg (12/27 1016) Pulse Rate: 57 (12/27 1016)  Labs:  Recent Labs  05/17/14 1120 05/17/14 1203 05/17/14 1932 05/18/14 0633 05/18/14 0635  HGB  --  14.2  --   --  13.3  HCT  --  41.4  --   --  40.0  PLT  --  212  --   --  191  LABPROT  --   --  14.4  --   --   INR  --   --  1.10  --   --   HEPARINUNFRC 0.49  --   --  0.45  --   CREATININE  --  1.23  --   --  1.15  TROPONINI <0.03  --  <0.03  --  <0.03    Estimated Creatinine Clearance: 71.7 mL/min (by C-G formula based on Cr of 1.15).   Medications:  Infusions:  . sodium chloride 50 mL (05/18/14 1022)  . heparin 850 Units/hr (05/17/14 1720)    Assessment: 58 yo M on heparin for chest pain/ACS to r/o MI. Heparin confirmatory level this morning remains therapeutic at 0.45. Hgb and plts stable. No bleeding noted.   Goal of Therapy:  Heparin level 0.3-0.7 units/ml Monitor platelets by anticoagulation protocol: Yes   Plan:  Continue heparin gtt @ 850 units/hr Daily HL/CBC Continue to monitor H&H and platelets  Monitor s/sx of bleeding  Thank you for allowing pharmacy to be part of this patient's care team  Many, Pharm.D Clinical Pharmacy Resident Pager: [phone] 05/18/2014 .10:30 AM

## 2014-05-18 NOTE — Progress Notes (Signed)
ANTICOAGULATION CONSULT NOTE  Pharmacy Consult for Heparin Indication: chest pain/ACS  No Known Allergies  Patient Measurements: Height: 5\' 9"  (175.3 cm) Weight: 155 lb 4.8 oz (70.444 kg) IBW/kg (Calculated) : 70.7 Heparin Dosing Weight: 72 kg  Vital Signs: Temp: 98 F (36.7 C) (12/26 2035) Temp Source: Oral (12/26 2035) BP: 125/79 mmHg (12/26 2035) Pulse Rate: 69 (12/26 2035)  Labs:  Recent Labs  05/17/14 1120 05/17/14 1203 05/17/14 1932  HGB  --  14.2  --   HCT  --  41.4  --   PLT  --  212  --   LABPROT  --   --  14.4  INR  --   --  1.10  HEPARINUNFRC 0.49  --   --   CREATININE  --  1.23  --   TROPONINI  --   --  <0.03    Estimated Creatinine Clearance: 66.8 mL/min (by C-G formula based on Cr of 1.23).  Assessment: 58 y.o. male with chest pain for heparin.  Heparin level above of 0.49 reported as drawn at 11:20 am actually drawn at 11:20 pm  Goal of Therapy:  Heparin level 0.3-0.7 units/ml Monitor platelets by anticoagulation protocol: Yes   Plan:  Continue Heparin at current rate Follow-up am labs.  Phillis Knack, PharmD, BCPS   05/18/2014,12:22 AM

## 2014-05-18 NOTE — Progress Notes (Signed)
Subjective:  No further CP. Enz neg  Objective:  Temp:  [97.8 F (36.6 C)-98 F (36.7 C)] 97.8 F (36.6 C) (12/27 0533) Pulse Rate:  [57-72] 57 (12/27 1016) Resp:  [13-19] 18 (12/27 0533) BP: (107-138)/(74-95) 132/89 mmHg (12/27 1016) SpO2:  [97 %-100 %] 100 % (12/27 0533) FiO2 (%):  [21 %] 21 % (12/26 1615) Weight:  [155 lb 4.8 oz (70.444 kg)-156 lb 9.6 oz (71.033 kg)] 156 lb 9.6 oz (71.033 kg) (12/27 0533) Weight change:   Intake/Output from previous day: 12/26 0701 - 12/27 0700 In: 922.5 [P.O.:240; I.V.:682.5] Out: -   Intake/Output from this shift:    Physical Exam: General appearance: alert and no distress Neck: no adenopathy, no carotid bruit, no JVD, supple, symmetrical, trachea midline and thyroid not enlarged, symmetric, no tenderness/mass/nodules Lungs: clear to auscultation bilaterally Heart: regular rate and rhythm, S1, S2 normal, no murmur, click, rub or gallop Extremities: extremities normal, atraumatic, no cyanosis or edema  Lab Results: Results for orders placed or performed during the hospital encounter of 05/17/14 (from the past 48 hour(s))  Troponin I-(serum)     Status: None   Collection Time: 05/17/14 11:20 AM  Result Value Ref Range   Troponin I <0.03 <0.031 ng/mL    Comment:        NO INDICATION OF MYOCARDIAL INJURY. Please note change in reference range.   Heparin level (unfractionated)     Status: None   Collection Time: 05/17/14 11:20 AM  Result Value Ref Range   Heparin Unfractionated 0.49 0.30 - 0.70 IU/mL    Comment:        IF HEPARIN RESULTS ARE BELOW EXPECTED VALUES, AND PATIENT DOSAGE HAS BEEN CONFIRMED, SUGGEST FOLLOW UP TESTING OF ANTITHROMBIN III LEVELS.   CBC     Status: Abnormal   Collection Time: 05/17/14 12:03 PM  Result Value Ref Range   WBC 5.1 4.0 - 10.5 K/uL   RBC 5.81 4.22 - 5.81 MIL/uL   Hemoglobin 14.2 13.0 - 17.0 g/dL   HCT 41.4 39.0 - 52.0 %   MCV 71.3 (L) 78.0 - 100.0 fL   MCH 24.4 (L) 26.0 - 34.0  pg   MCHC 34.3 30.0 - 36.0 g/dL   RDW 14.6 11.5 - 15.5 %   Platelets 212 150 - 400 K/uL  Basic metabolic panel     Status: Abnormal   Collection Time: 05/17/14 12:03 PM  Result Value Ref Range   Sodium 136 135 - 145 mmol/L    Comment: Please note change in reference range.   Potassium 3.7 3.5 - 5.1 mmol/L    Comment: Please note change in reference range.   Chloride 104 96 - 112 mEq/L   CO2 25 19 - 32 mmol/L   Glucose, Bld 82 70 - 99 mg/dL   BUN 17 6 - 23 mg/dL   Creatinine, Ser 1.23 0.50 - 1.35 mg/dL   Calcium 9.1 8.4 - 10.5 mg/dL   GFR calc non Af Amer 64 (L) >90 mL/min   GFR calc Af Amer 74 (L) >90 mL/min    Comment: (NOTE) The eGFR has been calculated using the CKD EPI equation. This calculation has not been validated in all clinical situations. eGFR's persistently <90 mL/min signify possible Chronic Kidney Disease.    Anion gap 7 5 - 15  I-stat troponin, ED (not at [facility])     Status: None   Collection Time: 05/17/14 12:12 PM  Result Value Ref Range   Troponin i, poc  0.00 0.00 - 0.08 ng/mL   Comment 3            Comment: Due to the release kinetics of cTnI, a negative result within the first hours of the onset of symptoms does not rule out myocardial infarction with certainty. If myocardial infarction is still suspected, repeat the test at appropriate intervals.   Magnesium     Status: None   Collection Time: 05/17/14  7:32 PM  Result Value Ref Range   Magnesium 1.9 1.5 - 2.5 mg/dL  TSH     Status: None   Collection Time: 05/17/14  7:32 PM  Result Value Ref Range   TSH 0.547 0.350 - 4.500 uIU/mL  T4, free     Status: Abnormal   Collection Time: 05/17/14  7:32 PM  Result Value Ref Range   Free T4 0.67 (L) 0.80 - 1.80 ng/dL    Comment: Performed at [facility]  Troponin I-(serum)     Status: None   Collection Time: 05/17/14  7:32 PM  Result Value Ref Range   Troponin I <0.03 <0.031 ng/mL    Comment:        NO INDICATION OF MYOCARDIAL INJURY. Please  note change in reference range.   Hemoglobin A1c     Status: Abnormal   Collection Time: 05/17/14  7:32 PM  Result Value Ref Range   Hgb A1c MFr Bld 6.2 (H) <5.7 %    Comment: (NOTE)                                                                       According to the ADA Clinical Practice Recommendations for 2011, when HbA1c is used as a screening test:  >=6.5%   Diagnostic of Diabetes Mellitus           (if abnormal result is confirmed) 5.7-6.4%   Increased risk of developing Diabetes Mellitus References:Diagnosis and Classification of Diabetes Mellitus,Diabetes ENID,7824,23(NTIRW 1):S62-S69 and Standards of Medical Care in         Diabetes - 2011,Diabetes ERXV,4008,67 (Suppl 1):S11-S61.    Mean Plasma Glucose 131 (H) <117 mg/dL    Comment: Performed at [place] [facility]     Status: None   Collection Time: 05/17/14  7:32 PM  Result Value Ref Range   Prothrombin Time 14.4 11.6 - 15.2 seconds   INR 1.10 0.00 - 1.49  Hepatic function panel     Status: Abnormal   Collection Time: 05/17/14  7:32 PM  Result Value Ref Range   Total Protein 6.4 6.0 - 8.3 g/dL   Albumin 3.5 3.5 - 5.2 g/dL   AST 21 0 - 37 U/L   ALT 17 0 - 53 U/L   Alkaline Phosphatase 72 39 - 117 U/L   Total Bilirubin 0.2 (L) 0.3 - 1.2 mg/dL   Bilirubin, Direct <0.1 0.0 - 0.3 mg/dL   Indirect Bilirubin NOT CALCULATED 0.3 - 0.9 mg/dL  Lipid panel     Status: Abnormal   Collection Time: 05/18/14  6:33 AM  Result Value Ref Range   Cholesterol 141 0 - 200 mg/dL   Triglycerides 45 <150 mg/dL   HDL 39 (L) >39 mg/dL   Total CHOL/HDL Ratio 3.6 RATIO   VLDL  9 0 - 40 mg/dL   LDL Cholesterol 93 0 - 99 mg/dL    Comment:        Total Cholesterol/HDL:CHD Risk Coronary Heart Disease Risk Table                     Men   Women  1/2 Average Risk   3.4   3.3  Average Risk       5.0   4.4  2 X Average Risk   9.6   7.1  3 X Average Risk  23.4   11.0        Use the calculated Patient Ratio above and the CHD  Risk Table to determine the patient's CHD Risk.        ATP III CLASSIFICATION (LDL):  <100     mg/dL   Optimal  100-129  mg/dL   Near or Above                    Optimal  130-159  mg/dL   Borderline  160-189  mg/dL   High  >190     mg/dL   Very High   Heparin level (unfractionated)     Status: None   Collection Time: 05/18/14  6:33 AM  Result Value Ref Range   Heparin Unfractionated 0.45 0.30 - 0.70 IU/mL    Comment:        IF HEPARIN RESULTS ARE BELOW EXPECTED VALUES, AND PATIENT DOSAGE HAS BEEN CONFIRMED, SUGGEST FOLLOW UP TESTING OF ANTITHROMBIN III LEVELS.   Troponin I-(serum)     Status: None   Collection Time: 05/18/14  6:35 AM  Result Value Ref Range   Troponin I <0.03 <0.031 ng/mL    Comment:        NO INDICATION OF MYOCARDIAL INJURY. Please note change in reference range.   Basic metabolic panel     Status: Abnormal   Collection Time: 05/18/14  6:35 AM  Result Value Ref Range   Sodium 136 135 - 145 mmol/L    Comment: Please note change in reference range.   Potassium 3.9 3.5 - 5.1 mmol/L    Comment: Please note change in reference range.   Chloride 107 96 - 112 mEq/L   CO2 21 19 - 32 mmol/L   Glucose, Bld 98 70 - 99 mg/dL   BUN 18 6 - 23 mg/dL   Creatinine, Ser 1.15 0.50 - 1.35 mg/dL   Calcium 8.9 8.4 - 10.5 mg/dL   GFR calc non Af Amer 69 (L) >90 mL/min   GFR calc Af Amer 80 (L) >90 mL/min    Comment: (NOTE) The eGFR has been calculated using the CKD EPI equation. This calculation has not been validated in all clinical situations. eGFR's persistently <90 mL/min signify possible Chronic Kidney Disease.    Anion gap 8 5 - 15  CBC     Status: Abnormal   Collection Time: 05/18/14  6:35 AM  Result Value Ref Range   WBC 3.8 (L) 4.0 - 10.5 K/uL   RBC 5.53 4.22 - 5.81 MIL/uL   Hemoglobin 13.3 13.0 - 17.0 g/dL   HCT 40.0 39.0 - 52.0 %   MCV 72.3 (L) 78.0 - 100.0 fL   MCH 24.1 (L) 26.0 - 34.0 pg   MCHC 33.3 30.0 - 36.0 g/dL   RDW 14.7 11.5 - 15.5 %    Platelets 191 150 - 400 K/uL    Imaging: Imaging results have been reviewed  Assessment/Plan:  1. Principal Problem: 2.   Chest pain 3. Active Problems: 4.   TOBACCO ABUSE 5.   Hypertension 6.   Episodic lightheadedness 7.   Chest pain at rest 8.   Time Spent Directly with Patient:  15 minutes  Length of Stay:  LOS: 1 day   No further CP. Enz neg. Will get Lexiscan myoview in AM. If neg can be D/Cd home after that.  Lorretta Harp 05/18/2014, 12:16 PM

## 2014-05-19 ENCOUNTER — Inpatient Hospital Stay (HOSPITAL_COMMUNITY): Payer: Medicaid Other

## 2014-05-19 ENCOUNTER — Other Ambulatory Visit: Payer: Self-pay

## 2014-05-19 DIAGNOSIS — I2510 Atherosclerotic heart disease of native coronary artery without angina pectoris: Secondary | ICD-10-CM

## 2014-05-19 DIAGNOSIS — Z720 Tobacco use: Secondary | ICD-10-CM

## 2014-05-19 DIAGNOSIS — R0789 Other chest pain: Secondary | ICD-10-CM | POA: Insufficient documentation

## 2014-05-19 LAB — CBC
HEMATOCRIT: 38.2 % — AB (ref 39.0–52.0)
Hemoglobin: 12.9 g/dL — ABNORMAL LOW (ref 13.0–17.0)
MCH: 23.9 pg — ABNORMAL LOW (ref 26.0–34.0)
MCHC: 33.8 g/dL (ref 30.0–36.0)
MCV: 70.7 fL — AB (ref 78.0–100.0)
Platelets: 185 10*3/uL (ref 150–400)
RBC: 5.4 MIL/uL (ref 4.22–5.81)
RDW: 14.4 % (ref 11.5–15.5)
WBC: 3.9 10*3/uL — ABNORMAL LOW (ref 4.0–10.5)

## 2014-05-19 LAB — HEPARIN LEVEL (UNFRACTIONATED): Heparin Unfractionated: 0.23 IU/mL — ABNORMAL LOW (ref 0.30–0.70)

## 2014-05-19 MED ORDER — TECHNETIUM TC 99M SESTAMIBI GENERIC - CARDIOLITE
30.0000 | Freq: Once | INTRAVENOUS | Status: AC | PRN
  Administered 2014-05-19: 30 via INTRAVENOUS

## 2014-05-19 MED ORDER — METOPROLOL TARTRATE 25 MG PO TABS: 12.5000 mg | ORAL_TABLET | Freq: Two times a day (BID) | ORAL | Status: DC

## 2014-05-19 MED ORDER — HEPARIN (PORCINE) IN NACL 100-0.45 UNIT/ML-% IJ SOLN: 1000.0000 [IU]/h | INTRAMUSCULAR | Status: DC

## 2014-05-19 MED ORDER — PNEUMOCOCCAL VAC POLYVALENT 25 MCG/0.5ML IJ INJ
0.5000 mL | INJECTION | Freq: Once | INTRAMUSCULAR | Status: AC
  Administered 2014-05-19: 0.5 mL via INTRAMUSCULAR
  Filled 2014-05-19: qty 0.5

## 2014-05-19 MED ORDER — TECHNETIUM TC 99M SESTAMIBI GENERIC - CARDIOLITE
10.0000 | Freq: Once | INTRAVENOUS | Status: AC | PRN
  Administered 2014-05-19: 10 via INTRAVENOUS

## 2014-05-19 MED ORDER — REGADENOSON 0.4 MG/5ML IV SOLN
INTRAVENOUS | Status: AC
  Administered 2014-05-19: 0.4 mg via INTRAVENOUS
  Filled 2014-05-19: qty 5

## 2014-05-19 MED ORDER — REGADENOSON 0.4 MG/5ML IV SOLN
0.4000 mg | Freq: Once | INTRAVENOUS | Status: AC
  Administered 2014-05-19: 0.4 mg via INTRAVENOUS
  Filled 2014-05-19: qty 5

## 2014-05-19 MED ORDER — INFLUENZA VAC SPLIT QUAD 0.5 ML IM SUSY
0.5000 mL | PREFILLED_SYRINGE | Freq: Once | INTRAMUSCULAR | Status: AC
  Administered 2014-05-19: 0.5 mL via INTRAMUSCULAR
  Filled 2014-05-19: qty 0.5

## 2014-05-19 NOTE — Care Management Utilization Note (Signed)
UR completed.    Sloane Junkin Wise Kaizen Ibsen, RN, BSN Phone #[phone]  

## 2014-05-19 NOTE — Progress Notes (Addendum)
ANTICOAGULATION CONSULT NOTE - Follow Up Consult  Pharmacy Consult:  Heparin Indication: chest pain/ACS  No Known Allergies  Patient Measurements: Height: 5\' 9"  (175.3 cm) Weight: 161 lb (73.029 kg) IBW/kg (Calculated) : 70.7 Heparin Dosing Weight: 71 kg  Vital Signs: Temp: 97.9 F (36.6 C) (12/28 0400) Temp Source: Oral (12/28 0400) BP: 152/105 mmHg (12/28 0400) Pulse Rate: 60 (12/28 0400)  Labs:  Recent Labs  05/17/14 1120  05/17/14 1203 05/17/14 1932 05/18/14 0633 05/18/14 0635 05/19/14 0553  HGB  --   < > 14.2  --   --  13.3 12.9*  HCT  --   --  41.4  --   --  40.0 38.2*  PLT  --   --  212  --   --  191 185  LABPROT  --   --   --  14.4  --   --   --   INR  --   --   --  1.10  --   --   --   HEPARINUNFRC 0.49  --   --   --  0.45  --  0.23*  CREATININE  --   --  1.23  --   --  1.15  --   TROPONINI <0.03  --   --  <0.03  --  <0.03  --   < > = values in this interval not displayed.  Estimated Creatinine Clearance: 71.7 mL/min (by C-G formula based on Cr of 1.15).    Assessment: 58 YOM on IV heparin for chest pain/ACS.  Heparin level sub-therapeutic this AM but patient is currently off the floor for stress test.  No bleeding reported.   Goal of Therapy:  Heparin level 0.3-0.7 units/ml Monitor platelets by anticoagulation protocol: Yes    Plan:  - F/U post stress test.  If to continue heparin, will increase gtt to 1000 units/hr and recheck a heparin level - Daily HL / CBC    Luismario Coston D. Mina Marble, PharmD, BCPS Pager:  [phone] 05/19/2014, 8:31 AM    =====================================   Addendum: - s/p stress test and awaiting result   Plan: - increase heparin as above while waiting for result    Willodene Stallings D. Mina Marble, PharmD, BCPS Pager:  [phone] 05/19/2014, 1:24 PM

## 2014-05-19 NOTE — Progress Notes (Signed)
Lexiscan MV performed 

## 2014-05-19 NOTE — Progress Notes (Signed)
CSW (Clinical Education officer, museum) received consult. Consult more appropriate for RNCM. CSW notified RNCM of pt needs. Please reconsult should social work needs arise.  [place], [place]

## 2014-05-19 NOTE — Progress Notes (Addendum)
Primary Cardiologist: Dr. Burt Knack   Patient Profile: 58 y/o male with h/o CAD, HTN, tobacco abuse and medical noncompliance admitted for chest pain. Last [facility] 06/2013 showed moderate mid RCA disease but otherwise nl cors and LV fxn. Med Rx recommended at that time.   Subjective: No more chest pain, no SOB  Objective: Vital signs in last 24 hours: Temp:  [97.9 F (36.6 C)-98 F (36.7 C)] 97.9 F (36.6 C) (12/28 0400) Pulse Rate:  [54-62] 60 (12/28 0400) Resp:  [18] 18 (12/28 0400) BP: (131-154)/(88-105) 152/105 mmHg (12/28 0400) SpO2:  [99 %-100 %] 100 % (12/28 0400) Weight:  [161 lb (73.029 kg)] 161 lb (73.029 kg) (12/28 0421) Last BM Date: 05/15/14 (last bm per patient report)  Intake/Output from previous day: 12/27 0701 - 12/28 0700 In: 1057 [P.O.:355; I.V.:702] Out: 550 [Urine:550]  Medications Current Facility-Administered Medications  Medication Dose Route Frequency Provider Last Rate Last Dose  . 0.9 %  sodium chloride infusion   Intravenous Continuous Isaiah Serge, NP 50 mL/hr at 05/18/14 1022 50 mL at 05/18/14 1022  . acetaminophen (TYLENOL) tablet 650 mg  650 mg Oral Q4H PRN Isaiah Serge, NP   650 mg at 05/18/14 0455  . ALPRAZolam Duanne Moron) tablet 0.25 mg  0.25 mg Oral BID PRN Isaiah Serge, NP      . aspirin EC tablet 81 mg  81 mg Oral Daily Isaiah Serge, NP   81 mg at 05/18/14 1016  . heparin ADULT infusion 100 units/mL (25000 units/250 mL)  850 Units/hr Intravenous Continuous Rebecka Apley, RPH 8.5 mL/hr at 05/18/14 1639 850 Units/hr at 05/18/14 1639  . irbesartan (AVAPRO) tablet 300 mg  300 mg Oral Daily Lorretta Harp, MD   300 mg at 05/18/14 1016   And  . hydrochlorothiazide (HYDRODIURIL) tablet 25 mg  25 mg Oral Daily Lorretta Harp, MD   25 mg at 05/18/14 1016  . Influenza vac split quadrivalent PF (FLUARIX) injection 0.5 mL  0.5 mL Intramuscular Once Lorretta Harp, MD      . ketorolac (TORADOL) 30 MG/ML injection 30 mg  30 mg Intravenous 4 times per  day Isaiah Serge, NP   30 mg at 05/19/14 0552  . metoprolol tartrate (LOPRESSOR) tablet 12.5 mg  12.5 mg Oral BID Isaiah Serge, NP   12.5 mg at 05/18/14 2116  . nicotine (NICODERM CQ - dosed in mg/24 hours) patch 14 mg  14 mg Transdermal Q24H Inez Pilgrim, MD   14 mg at 05/18/14 2116  . nitroGLYCERIN (NITROSTAT) SL tablet 0.4 mg  0.4 mg Sublingual Q5 Min x 3 PRN Isaiah Serge, NP      . ondansetron [facility]) injection 4 mg  4 mg Intravenous Q6H PRN Isaiah Serge, NP      . pneumococcal 23 valent vaccine (PNU-IMMUNE) injection 0.5 mL  0.5 mL Intramuscular Once Lorretta Harp, MD      . rosuvastatin (CRESTOR) tablet 10 mg  10 mg Oral Daily Isaiah Serge, NP   10 mg at 05/18/14 1016  . zolpidem (AMBIEN) tablet 5 mg  5 mg Oral QHS PRN,MR X 1 Isaiah Serge, NP   5 mg at 05/18/14 2116    PE: General: Well developed, well nourished, male in no acute distress Head: Eyes PERRLA, No xanthomas.   Normocephalic and atraumatic  Lungs: Clear bilaterally to auscultation. Heart: HRRR S1 S2, without MRG.  Pulses are 2+ & equal. No JVD.  Abdomen: Bowel  sounds are present, abdomen soft and non-tender without masses or  hernias noted. Msk: Normal strength and tone for age. Extremities: No clubbing, cyanosis or edema.    Skin:  No rashes or lesions noted. Neuro: Alert and oriented X 3. Psych:  Good affect, responds appropriately   Lab Results:   Recent Labs  05/17/14 1203 05/18/14 0635 05/19/14 0553  WBC 5.1 3.8* 3.9*  HGB 14.2 13.3 12.9*  HCT 41.4 40.0 38.2*  PLT 212 191 185   BMET  Recent Labs  05/17/14 1203 05/18/14 0635  NA 136 136  K 3.7 3.9  CL 104 107  CO2 25 21  GLUCOSE 82 98  BUN 17 18  CREATININE 1.23 1.15  CALCIUM 9.1 8.9   PT/INR  Recent Labs  05/17/14 1932  LABPROT 14.4  INR 1.10   Cholesterol  Recent Labs  05/18/14 0633  CHOL 141   Cardiac Panel (last 3 results)  Recent Labs  05/17/14 1120 05/17/14 1932 05/18/14 0635  TROPONINI <0.03 <0.03  <0.03    Assessment/Plan  Principal Problem:   Chest pain Active Problems:   TOBACCO ABUSE   Hypertension   Episodic lightheadedness   Chest pain at rest  1. Chest Pain: LHC 06/2013 demonstrated moderate RCA disease but otherwise nl cors and LV function. Cardiac enzymes this admission have been negative x 3. EKGs show nonspecific changes. Plan is for stress test today to assess for ischemia. On ASA, BB, statin  2. HTN: elevated in the 294T systolic and low 654Y diastolic. Continue HCTZ, irbesartan and metoprolol. HR 50s at rest, so no increase in BB.   3. Tobacco abuse: smoking cessation strongly advised.   Plan - possible d/c if MV negative   LOS: 2 days    Brittainy M. Ladoris Gene 05/19/2014 7:45 AM   Agree with plan above. If NUC low risk, OK for DC.  Candee Furbish, MD
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
## 2014-05-20 NOTE — Care Management Note (Signed)
    Page 1 of 1   05/20/2014     4:33:36 PM CARE MANAGEMENT NOTE 05/20/2014  Patient:  Troy Mclaughlin, Troy Mclaughlin   Account Number:  [id number]  Date Initiated:  05/19/2014  Documentation initiated by:  Lamin Chandley  Subjective/Objective Assessment:   Pt adm on 05/17/14 with chest pain.  PTA, pt indepedent of ADLs.     Action/Plan:   Pt has no insurance and no PCP.  He would [facility].  F/U appt made for 12/30 at 9:00am.   Anticipated DC Date:  05/19/2014   Anticipated DC Plan:  [place]  CM consult  Medication [facility]  PCP issues      Choice offered to / List presented to:             Status of service:  Completed, signed off Medicare Important Message given?  NA - LOS <3 / Initial given by admissions (If response is "NO", the following Medicare IM given date fields will be blank) Date Medicare IM given:   Medicare IM given by:   Date Additional Medicare IM given:   Additional Medicare IM given by:    Discharge Disposition:  HOME/SELF CARE  Per UR Regulation:  Reviewed for med. necessity/level of care/duration of stay  If discussed at [place] of Stay Meetings, dates discussed:    Comments:  05/19/14 Ellan Lambert, RN, BSN Pt states he has trouble affording Benecar, which is $300, and Crestor, which is $100.  This causes him to be noncompliant with these meds.  Notified Rosaria Ferries, PA with cardiology...she states she will discuss with MD and will get them changed to more affordable meds.

## 2014-05-22 ENCOUNTER — Other Ambulatory Visit (INDEPENDENT_AMBULATORY_CARE_PROVIDER_SITE_OTHER): Payer: Self-pay | Admitting: *Deleted

## 2014-05-22 DIAGNOSIS — I10 Essential (primary) hypertension: Secondary | ICD-10-CM

## 2014-05-22 LAB — BASIC METABOLIC PANEL
BUN: 16 mg/dL (ref 6–23)
CHLORIDE: 99 meq/L (ref 96–112)
CO2: 26 meq/L (ref 19–32)
Calcium: 9.2 mg/dL (ref 8.4–10.5)
Creatinine, Ser: 1.2 mg/dL (ref 0.4–1.5)
GFR: 79.49 mL/min (ref >60.00)
GLUCOSE: 92 mg/dL (ref 70–99)
POTASSIUM: 4 meq/L (ref 3.5–5.1)
Sodium: 132 mEq/L — ABNORMAL LOW (ref 135–145)

## 2014-05-22 NOTE — Addendum Note (Signed)
Addended by: Eulis Foster on: 05/22/2014 09:17 AM   Modules accepted: Orders

## 2014-05-27 ENCOUNTER — Telehealth: Payer: Self-pay | Admitting: *Deleted

## 2014-05-27 NOTE — Telephone Encounter (Signed)
pt notified this morning about lab results with verbal understanding. Pt said he cannot keep affording his medications. He said crestor is $300. I did not have any samples of crestor however; I did put at front desk more samples of benicar 40/25. I stated that I will have Lovey Newcomer our Prior Auth RN call to see if he may qualify for assistance on the crestor. Pt said ok and thank you. I have tried x 2 more times to reach pt to let him know that I do have samples of the benicar 40/25 at the front desk.

## 2014-06-26 NOTE — Telephone Encounter (Signed)
Error close  

## 2014-06-27 ENCOUNTER — Telehealth: Payer: Self-pay

## 2014-06-27 ENCOUNTER — Other Ambulatory Visit: Payer: Self-pay | Admitting: Physician Assistant

## 2014-06-27 NOTE — Telephone Encounter (Signed)
Patient called to get samples of crestor 10 mg and benicar 4./25 placed samples up front

## 2014-07-25 ENCOUNTER — Telehealth: Payer: Self-pay

## 2014-07-25 NOTE — Telephone Encounter (Signed)
Patient called for samples of crestor 10 and benicar 40/25 placed sample up front

## 2014-08-13 ENCOUNTER — Emergency Department (HOSPITAL_COMMUNITY)
Admission: EM | Admit: 2014-08-13 | Discharge: 2014-08-13 | Disposition: A | Discharge status: Home / Self Care | Payer: Medicaid Other | Attending: Emergency Medicine | Admitting: Emergency Medicine

## 2014-08-13 ENCOUNTER — Encounter (HOSPITAL_COMMUNITY): Payer: Self-pay | Admitting: Emergency Medicine

## 2014-08-13 DIAGNOSIS — N529 Male erectile dysfunction, unspecified: Secondary | ICD-10-CM | POA: Diagnosis not present

## 2014-08-13 DIAGNOSIS — Z8719 Personal history of other diseases of the digestive system: Secondary | ICD-10-CM | POA: Diagnosis not present

## 2014-08-13 DIAGNOSIS — I10 Essential (primary) hypertension: Secondary | ICD-10-CM | POA: Insufficient documentation

## 2014-08-13 DIAGNOSIS — Z7982 Long term (current) use of aspirin: Secondary | ICD-10-CM | POA: Insufficient documentation

## 2014-08-13 DIAGNOSIS — R300 Dysuria: Secondary | ICD-10-CM | POA: Insufficient documentation

## 2014-08-13 DIAGNOSIS — Z8673 Personal history of transient ischemic attack (TIA), and cerebral infarction without residual deficits: Secondary | ICD-10-CM | POA: Insufficient documentation

## 2014-08-13 DIAGNOSIS — R1030 Lower abdominal pain, unspecified: Secondary | ICD-10-CM | POA: Diagnosis present

## 2014-08-13 DIAGNOSIS — Z9889 Other specified postprocedural states: Secondary | ICD-10-CM | POA: Insufficient documentation

## 2014-08-13 DIAGNOSIS — Z79899 Other long term (current) drug therapy: Secondary | ICD-10-CM | POA: Insufficient documentation

## 2014-08-13 DIAGNOSIS — Z720 Tobacco use: Secondary | ICD-10-CM | POA: Insufficient documentation

## 2014-08-13 DIAGNOSIS — Z8669 Personal history of other diseases of the nervous system and sense organs: Secondary | ICD-10-CM | POA: Diagnosis not present

## 2014-08-13 LAB — URINALYSIS, ROUTINE W REFLEX MICROSCOPIC
Bilirubin Urine: NEGATIVE
GLUCOSE, UA: NEGATIVE mg/dL
HGB URINE DIPSTICK: NEGATIVE
Ketones, ur: NEGATIVE mg/dL
Leukocytes, UA: NEGATIVE
Nitrite: NEGATIVE
Protein, ur: NEGATIVE mg/dL
Specific Gravity, Urine: 1.02 (ref 1.005–1.030)
Urobilinogen, UA: 0.2 mg/dL (ref 0.0–1.0)
pH: 6 (ref 5.0–8.0)
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
## 2014-08-13 NOTE — Discharge Instructions (Signed)
Dysuria Dysuria is the medical term for pain with urination. There are many causes for dysuria, but urinary tract infection is the most common. If a urinalysis was performed it can show that there is a urinary tract infection. A urine culture confirms that you or your child is sick. You will need to follow up with a healthcare provider because:  If a urine culture was done you will need to know the culture results and treatment recommendations.  If the urine culture was positive, you or your child will need to be put on antibiotics or know if the antibiotics prescribed are the right antibiotics for your urinary tract infection.  If the urine culture is negative (no urinary tract infection), then other causes may need to be explored or antibiotics need to be stopped. Today laboratory work may have been done and there does not seem to be an infection. If cultures were done they will take at least 24 to 48 hours to be completed. Today x-rays may have been taken and they read as normal. No cause can be found for the problems. The x-rays may be re-read by a radiologist and you will be contacted if additional findings are made. You or your child may have been put on medications to help with this problem until you can see your primary caregiver. If the problems get better, see your primary caregiver if the problems return. If you were given antibiotics (medications which kill germs), take all of the mediations as directed for the full course of treatment.  If laboratory work was done, you need to find the results. Leave a telephone number where you can be reached. If this is not possible, make sure you find out how you are to get test results. HOME CARE INSTRUCTIONS   Drink lots of fluids. For adults, drink eight, 8 ounce glasses of clear juice or water a day. For children, replace fluids as suggested by your caregiver.  Empty the bladder often. Avoid holding urine for long periods of time.  After a bowel  movement, women should cleanse front to back, using each tissue only once.  Empty your bladder before and after sexual intercourse.  Take all the medicine given to you until it is gone. You may feel better in a few days, but TAKE ALL MEDICINE.  Avoid caffeine, tea, alcohol and carbonated beverages, because they tend to irritate the bladder.  In men, alcohol may irritate the prostate.  Only take over-the-counter or prescription medicines for pain, discomfort, or fever as directed by your caregiver.  If your caregiver has given you a follow-up appointment, it is very important to keep that appointment. Not keeping the appointment could result in a chronic or permanent injury, pain, and disability. If there is any problem keeping the appointment, you must call back to this facility for assistance. SEEK IMMEDIATE MEDICAL CARE IF:   Back pain develops.  A fever develops.  There is nausea (feeling sick to your stomach) or vomiting (throwing up).  Problems are no better with medications or are getting worse. MAKE SURE YOU:   Understand these instructions.  Will watch your condition.  Will get help right away if you are not doing well or get worse. Document Released: 02/05/2004 Document Revised: 08/01/2011 Document Reviewed: 12/13/2007 [facility] Patient Information 2015 [place], [place]. This information is not intended to replace advice given to you by your health care provider. Make sure you discuss any questions you have with your health care provider.  Erectile Dysfunction Erectile  dysfunction is the inability to get or sustain a good enough erection to have sexual intercourse. Erectile dysfunction may involve:  Inability to get an erection.  Lack of enough hardness to allow penetration.  Loss of the erection before sex is finished.  Premature ejaculation. CAUSES  Certain drugs, such as:  Pain relievers.  Antihistamines.  Antidepressants.  Blood pressure  medicines.  Water pills (diuretics).  Ulcer medicines.  Muscle relaxants.  Illegal drugs.  Excessive drinking.  Psychological causes, such as:  Anxiety.  Depression.  Sadness.  Exhaustion.  Performance fear.  Stress.  Physical causes, such as:  Artery problems. This may include diabetes, smoking, liver disease, or atherosclerosis.  High blood pressure.  Hormonal problems, such as low testosterone.  Obesity.  Nerve problems. This may include back or pelvic injuries, diabetes mellitus, multiple sclerosis, or Parkinson disease. SYMPTOMS  Inability to get an erection.  Lack of enough hardness to allow penetration.  Loss of the erection before sex is finished.  Premature ejaculation.  Normal erections at some times, but with frequent unsatisfactory episodes.  Orgasms that are not satisfactory in sensation or frequency.  Low sexual satisfaction in either partner because of erection problems.  A curved penis occurring with erection. The curve may cause pain or may be too curved to allow for intercourse.  Never having nighttime erections. DIAGNOSIS Your caregiver can often diagnose this condition by:  Performing a physical exam to find other diseases or specific problems with the penis.  Asking you detailed questions about the problem.  Performing blood tests to check for diabetes mellitus or to measure hormone levels.  Performing urine tests to find other underlying health conditions.  Performing an ultrasound exam to check for scarring.  Performing a test to check blood flow to the penis.  Doing a sleep study at home to measure nighttime erections. TREATMENT   You may be prescribed medicines by mouth.  You may be given medicine injections into the penis.  You may be prescribed a vacuum pump with a ring.  Penile implant surgery may be performed. You may receive:  An inflatable implant.  A semirigid implant.  Blood vessel surgery may be  performed. HOME CARE INSTRUCTIONS  If you are prescribed oral medicine, you should take the medicine as prescribed. Do not increase the dosage without first discussing it with your physician.  If you are using self-injections, be careful to avoid any veins that are on the surface of the penis. Apply pressure to the injection site for 5 minutes.  If you are using a vacuum pump, make sure you have read the instructions before using it. Discuss any questions with your physician before taking the pump home. SEEK MEDICAL CARE IF:  You experience pain that is not responsive to the pain medicine you have been prescribed.  You experience nausea or vomiting. SEEK IMMEDIATE MEDICAL CARE IF:   When taking oral or injectable medications, you experience an erection that lasts longer than 4 hours. If your physician is unavailable, go to the nearest emergency room for evaluation. An erection that lasts much longer than 4 hours can result in permanent damage to your penis.  You have pain that is severe.  You develop redness, severe pain, or severe swelling of your penis.  You have redness spreading up into your groin or lower abdomen.  You are unable to pass your urine. Document Released: 05/06/2000 Document Revised: 01/09/2013 Document Reviewed: 10/11/2012 [facility] Patient Information 2015 [place], [place]. This information is not intended to  replace advice given to you by your health care provider. Make sure you discuss any questions you have with your health care provider. ° °

## 2014-08-13 NOTE — ED Notes (Addendum)
PT lifted heavy pool table Sunday; now reporting aching bilateral groin near testicles and penile shaft. Denies discharge.

## 2014-08-25 ENCOUNTER — Ambulatory Visit: Payer: Medicaid Other | Attending: Internal Medicine | Admitting: Internal Medicine

## 2014-08-25 ENCOUNTER — Encounter: Payer: Self-pay | Admitting: Internal Medicine

## 2014-08-25 ENCOUNTER — Telehealth: Payer: Self-pay | Admitting: *Deleted

## 2014-08-25 VITALS — BP 120/81 | HR 55 | Temp 98.0°F | Resp 16 | Ht 69.0 in | Wt 161.0 lb

## 2014-08-25 DIAGNOSIS — I25118 Atherosclerotic heart disease of native coronary artery with other forms of angina pectoris: Secondary | ICD-10-CM | POA: Diagnosis not present

## 2014-08-25 DIAGNOSIS — Z720 Tobacco use: Secondary | ICD-10-CM

## 2014-08-25 DIAGNOSIS — R42 Dizziness and giddiness: Secondary | ICD-10-CM | POA: Insufficient documentation

## 2014-08-25 DIAGNOSIS — Z9114 Patient's other noncompliance with medication regimen: Secondary | ICD-10-CM | POA: Diagnosis not present

## 2014-08-25 DIAGNOSIS — I252 Old myocardial infarction: Secondary | ICD-10-CM | POA: Insufficient documentation

## 2014-08-25 DIAGNOSIS — I10 Essential (primary) hypertension: Secondary | ICD-10-CM | POA: Diagnosis not present

## 2014-08-25 DIAGNOSIS — H538 Other visual disturbances: Secondary | ICD-10-CM | POA: Diagnosis not present

## 2014-08-25 DIAGNOSIS — N522 Drug-induced erectile dysfunction: Secondary | ICD-10-CM | POA: Insufficient documentation

## 2014-08-25 DIAGNOSIS — F1010 Alcohol abuse, uncomplicated: Secondary | ICD-10-CM | POA: Diagnosis not present

## 2014-08-25 DIAGNOSIS — Z7982 Long term (current) use of aspirin: Secondary | ICD-10-CM | POA: Diagnosis not present

## 2014-08-25 DIAGNOSIS — I2510 Atherosclerotic heart disease of native coronary artery without angina pectoris: Secondary | ICD-10-CM

## 2014-08-25 DIAGNOSIS — Z8673 Personal history of transient ischemic attack (TIA), and cerebral infarction without residual deficits: Secondary | ICD-10-CM | POA: Diagnosis not present

## 2014-08-25 DIAGNOSIS — R001 Bradycardia, unspecified: Secondary | ICD-10-CM | POA: Insufficient documentation

## 2014-08-25 DIAGNOSIS — Z1211 Encounter for screening for malignant neoplasm of colon: Secondary | ICD-10-CM

## 2014-08-25 DIAGNOSIS — F17210 Nicotine dependence, cigarettes, uncomplicated: Secondary | ICD-10-CM | POA: Diagnosis not present

## 2014-08-25 MED ORDER — ASPIRIN 81 MG PO TBEC: 81.0000 mg | DELAYED_RELEASE_TABLET | Freq: Every day | ORAL | Status: DC

## 2014-08-25 MED ORDER — NICOTINE 14 MG/24HR TD PT24: 14.0000 mg | MEDICATED_PATCH | Freq: Every day | TRANSDERMAL | Status: DC

## 2014-08-25 MED ORDER — ROSUVASTATIN CALCIUM 10 MG PO TABS: 10.0000 mg | ORAL_TABLET | Freq: Every day | ORAL | Status: DC

## 2014-08-25 MED ORDER — OLMESARTAN MEDOXOMIL-HCTZ 40-25 MG PO TABS: 1.0000 | ORAL_TABLET | Freq: Every day | ORAL | Status: DC

## 2014-08-25 NOTE — Patient Instructions (Signed)
I am stopping your Metoprolol due to low heart rate. Take Benicar daily and come back in 2 weeks for a repeat pressure. If your pressure is elevated at that time, I will add a different medication on at that time   Erectile Dysfunction Erectile dysfunction is the inability to get or sustain a good enough erection to have sexual intercourse. Erectile dysfunction may involve:  Inability to get an erection.  Lack of enough hardness to allow penetration.  Loss of the erection before sex is finished.  Premature ejaculation. CAUSES  Certain drugs, such as:  Pain relievers.  Antihistamines.  Antidepressants.  Blood pressure medicines.  Water pills (diuretics).  Ulcer medicines.  Muscle relaxants.  Illegal drugs.  Excessive drinking.  Psychological causes, such as:  Anxiety.  Depression.  Sadness.  Exhaustion.  Performance fear.  Stress.  Physical causes, such as:  Artery problems. This may include diabetes, smoking, liver disease, or atherosclerosis.  High blood pressure.  Hormonal problems, such as low testosterone.  Obesity.  Nerve problems. This may include back or pelvic injuries, diabetes mellitus, multiple sclerosis, or Parkinson disease. SYMPTOMS  Inability to get an erection.  Lack of enough hardness to allow penetration.  Loss of the erection before sex is finished.  Premature ejaculation.  Normal erections at some times, but with frequent unsatisfactory episodes.  Orgasms that are not satisfactory in sensation or frequency.  Low sexual satisfaction in either partner because of erection problems.  A curved penis occurring with erection. The curve may cause pain or may be too curved to allow for intercourse.  Never having nighttime erections. DIAGNOSIS Your caregiver can often diagnose this condition by:  Performing a physical exam to find other diseases or specific problems with the penis.  Asking you detailed questions about the  problem.  Performing blood tests to check for diabetes mellitus or to measure hormone levels.  Performing urine tests to find other underlying health conditions.  Performing an ultrasound exam to check for scarring.  Performing a test to check blood flow to the penis.  Doing a sleep study at home to measure nighttime erections. TREATMENT   You may be prescribed medicines by mouth.  You may be given medicine injections into the penis.  You may be prescribed a vacuum pump with a ring.  Penile implant surgery may be performed. You may receive:  An inflatable implant.  A semirigid implant.  Blood vessel surgery may be performed. HOME CARE INSTRUCTIONS  If you are prescribed oral medicine, you should take the medicine as prescribed. Do not increase the dosage without first discussing it with your physician.  If you are using self-injections, be careful to avoid any veins that are on the surface of the penis. Apply pressure to the injection site for 5 minutes.  If you are using a vacuum pump, make sure you have read the instructions before using it. Discuss any questions with your physician before taking the pump home. SEEK MEDICAL CARE IF:  You experience pain that is not responsive to the pain medicine you have been prescribed.  You experience nausea or vomiting. SEEK IMMEDIATE MEDICAL CARE IF:   When taking oral or injectable medications, you experience an erection that lasts longer than 4 hours. If your physician is unavailable, go to the nearest emergency room for evaluation. An erection that lasts much longer than 4 hours can result in permanent damage to your penis.  You have pain that is severe.  You develop redness, severe pain, or severe  swelling of your penis.  You have redness spreading up into your groin or lower abdomen.  You are unable to pass your urine. Document Released: 05/06/2000 Document Revised: 01/09/2013 Document Reviewed: 10/11/2012 [facility]  Patient Information 2015 [place], [place]. This information is not intended to replace advice given to you by your health care provider. Make sure you discuss any questions you have with your health care provider. Smoking Cessation Quitting smoking is important to your health and has many advantages. However, it is not always easy to quit since nicotine is a very addictive drug. Oftentimes, people try 3 times or more before being able to quit. This document explains the best ways for you to prepare to quit smoking. Quitting takes hard work and a lot of effort, but you can do it. ADVANTAGES OF QUITTING SMOKING  You will live longer, feel better, and live better.  Your body will feel the impact of quitting smoking almost immediately.  Within 20 minutes, blood pressure decreases. Your pulse returns to its normal level.  After 8 hours, carbon monoxide levels in the blood return to normal. Your oxygen level increases.  After 24 hours, the chance of having a heart attack starts to decrease. Your breath, hair, and body stop smelling like smoke.  After 48 hours, damaged nerve endings begin to recover. Your sense of taste and smell improve.  After 72 hours, the body is virtually free of nicotine. Your bronchial tubes relax and breathing becomes easier.  After 2 to 12 weeks, lungs can hold more air. Exercise becomes easier and circulation improves.  The risk of having a heart attack, stroke, cancer, or lung disease is greatly reduced.  After 1 year, the risk of coronary heart disease is cut in half.  After 5 years, the risk of stroke falls to the same as a nonsmoker.  After 10 years, the risk of lung cancer is cut in half and the risk of other cancers decreases significantly.  After 15 years, the risk of coronary heart disease drops, usually to the level of a nonsmoker.  If you are pregnant, quitting smoking will improve your chances of having a healthy baby.  The people you live with,  especially any children, will be healthier.  You will have extra money to spend on things other than cigarettes. QUESTIONS TO THINK ABOUT BEFORE ATTEMPTING TO QUIT You may want to talk about your answers with your health care provider.  Why do you want to quit?  If you tried to quit in the past, what helped and what did not?  What will be the most difficult situations for you after you quit? How will you plan to handle them?  Who can help you through the tough times? Your family? Friends? A health care provider?  What pleasures do you get from smoking? What ways can you still get pleasure if you quit? Here are some questions to ask your health care provider:  How can you help me to be successful at quitting?  What medicine do you think would be best for me and how should I take it?  What should I do if I need more help?  What is smoking withdrawal like? How can I get information on withdrawal? GET READY  Set a quit date.  Change your environment by getting rid of all cigarettes, ashtrays, matches, and lighters in your home, car, or work. Do not let people smoke in your home.  Review your past attempts to quit. Think about what worked and  what did not. GET SUPPORT AND ENCOURAGEMENT You have a better chance of being successful if you have help. You can get support in many ways.  Tell your family, friends, and coworkers that you are going to quit and need their support. Ask them not to smoke around you.  Get individual, group, or telephone counseling and support. Programs are available at [employer] and health centers. Call your local health department for information about programs in your area.  Spiritual beliefs and practices may help some smokers quit.  Download a "quit meter" on your computer to keep track of quit statistics, such as how long you have gone without smoking, cigarettes not smoked, and money saved.  Get a self-help book about quitting smoking and staying  off tobacco. [place] yourself from urges to smoke. Talk to someone, go for a walk, or occupy your time with a task.  Change your normal routine. Take a different route to work. Drink tea instead of coffee. Eat breakfast in a different place.  Reduce your stress. Take a hot bath, exercise, or read a book.  Plan something enjoyable to do every day. Reward yourself for not smoking.  Explore interactive web-based programs that specialize in helping you quit. GET MEDICINE AND USE IT CORRECTLY Medicines can help you stop smoking and decrease the urge to smoke. Combining medicine with the above behavioral methods and support can greatly increase your chances of successfully quitting smoking.  Nicotine replacement therapy helps deliver nicotine to your body without the negative effects and risks of smoking. Nicotine replacement therapy includes nicotine gum, lozenges, inhalers, nasal sprays, and skin patches. Some may be available over-the-counter and others require a prescription.  Antidepressant medicine helps people abstain from smoking, but how this works is unknown. This medicine is available by prescription.  Nicotinic receptor partial agonist medicine simulates the effect of nicotine in your brain. This medicine is available by prescription. Ask your health care provider for advice about which medicines to use and how to use them based on your health history. Your health care provider will tell you what side effects to look out for if you choose to be on a medicine or therapy. Carefully read the information on the package. Do not use any other product containing nicotine while using a nicotine replacement product.  RELAPSE OR DIFFICULT SITUATIONS Most relapses occur within the first 3 months after quitting. Do not be discouraged if you start smoking again. Remember, most people try several times before finally quitting. You may have symptoms of withdrawal because  your body is used to nicotine. You may crave cigarettes, be irritable, feel very hungry, cough often, get headaches, or have difficulty concentrating. The withdrawal symptoms are only temporary. They are strongest when you first quit, but they will go away within 10-14 days. To reduce the chances of relapse, try to:  Avoid drinking alcohol. Drinking lowers your chances of successfully quitting.  Reduce the amount of caffeine you consume. Once you quit smoking, the amount of caffeine in your body increases and can give you symptoms, such as a rapid heartbeat, sweating, and anxiety.  Avoid smokers because they can make you want to smoke.  Do not let weight gain distract you. Many smokers will gain weight when they quit, usually less than 10 pounds. Eat a healthy diet and stay active. You can always lose the weight gained after you quit.  Find ways to improve your mood other than smoking. FOR MORE INFORMATION  www.smokefree.gov  Document Released: 05/03/2001 Document Revised: 09/23/2013 Document Reviewed: 08/18/2011 [facility] Patient Information 2015 [place], [place]. This information is not intended to replace advice given to you by your health care provider. Make sure you discuss any questions you have with your health care provider.
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
## 2014-08-25 NOTE — Telephone Encounter (Signed)
Patient came in for samples of Benicar and [place] but have no Crestor and will not be getting anymore secondary to it going generic Patient just received his Medicaid card today and unsure what kind of drug benefit this will be Patient is completely out of Crestor and not sure if Medicaid will cover Patient would like to see if something else could be Rx'd that may be less expensive, no Crestor in a couple of days Patient has not tried anything other than Crestor Will forward to [employer]

## 2014-08-25 NOTE — Progress Notes (Signed)
Pt is here to establish care. Pt has a history of HTN, heart attack, stroke, and hyperlipidemia. Pt today feels dizzy. Pt is concerned that his medication is causing him to have ED.

## 2014-08-25 NOTE — Telephone Encounter (Signed)
DC Crestor Start Atorvastatin 40 mg Once daily  Check Lipids and LFTs in 6 weeks.   Richardson Dopp, PA-C   08/25/2014 11:45 PM

## 2014-08-26 MED ORDER — ATORVASTATIN CALCIUM 40 MG PO TABS: 40.0000 mg | ORAL_TABLET | Freq: Every day | ORAL | Status: DC

## 2014-08-26 MED ORDER — ATORVASTATIN CALCIUM 40 MG PO TABS
40.0000 mg | ORAL_TABLET | Freq: Every day | ORAL | Status: DC
Start: 2014-08-26 — End: 2014-08-26

## 2014-08-26 MED ORDER — METOPROLOL TARTRATE 25 MG PO TABS: 12.5000 mg | ORAL_TABLET | Freq: Every day | ORAL | Status: DC

## 2014-08-26 NOTE — Telephone Encounter (Signed)
I rtnd pt's call which pt states he forgot to let me know that he will need a refill on the metoprolol. Pt verified the new instructions with me again of metoprolol 12.5 mg at bed time.

## 2014-08-26 NOTE — Addendum Note (Signed)
Addended by: Michae Kava on: 08/26/2014 12:17 PM   Modules accepted: Orders

## 2014-08-26 NOTE — Telephone Encounter (Signed)
s/w pt today about changing from crestor to atorvastatin, FLP/LFT 10/17/14. Pt also then c/o metoprolol making him dizzy. D/w Brynda Rim. PA about metoprolol issue and pt advised per Brynda Rim. PA to only take metoprolol 12.5 mg at bedtime. Pt scheduled today for a follow up since he did not follow up from being seen in the hospital in 04/2014, appt with [place] weaver, PA 10/30/14 2 pm same day Dr. Burt Knack is in the office which is pt's primary cardiologist. Pt verbalized understanding to plan of care with read back on instructions x 2.

## 2014-08-28 ENCOUNTER — Telehealth: Payer: Self-pay | Admitting: Internal Medicine

## 2014-08-28 NOTE — Telephone Encounter (Signed)
Pt is having a burning sensation from the nicotine (NICODERM CQ - DOSED IN MG/24 HOURS) 14 mg/24hr patch. Please follow up with patient for advice.

## 2014-09-05 ENCOUNTER — Telehealth: Payer: Self-pay | Admitting: *Deleted

## 2014-09-05 MED ORDER — LISINOPRIL 40 MG PO TABS: 40.0000 mg | ORAL_TABLET | Freq: Every day | ORAL | Status: DC

## 2014-09-05 NOTE — Telephone Encounter (Signed)
Tell him to try lower dose of 7. May send rx

## 2014-09-05 NOTE — Telephone Encounter (Signed)
Patient aware that medicaid will not cover benicar. Per PCP patient will begin lisinopril 40 mg daily RX e-scribed to [facility] on [address]  Patient will finish supply of benicar (14 tabs) then begin lisinopril Patient will schedule nurse visit for BP check for 2 weeks after beginning lisinopril  Patient c/o burning pain with nicotine 14 mg patch. States, "it's too strong." Note sent to PCP

## 2014-09-18 ENCOUNTER — Telehealth: Payer: Self-pay | Admitting: Internal Medicine

## 2014-09-18 ENCOUNTER — Telehealth: Payer: Self-pay | Admitting: Cardiovascular Disease

## 2014-09-18 NOTE — Telephone Encounter (Signed)
Pt. Stated he was taken off benicar and wanted to know if he should be on it for his bp , I instructed pt. That if his bp was below 140/90 then he didn't need it but if it was consistantly over 140 /90 then Dr. Gwenlyn Found  Would need to take a closer look at what was going on

## 2014-09-18 NOTE — Telephone Encounter (Signed)
Pt calling to report that he is out of the Benicar he has been taking and is not sure if this is something he needs a refill on considering his recent heart attack.  Please f/u with pt with more info.

## 2014-09-18 NOTE — Telephone Encounter (Signed)
New message         Pt wants to know if his medication benicar is important for him to take

## 2014-10-03 ENCOUNTER — Telehealth: Payer: Self-pay | Admitting: Physician Assistant

## 2014-10-03 ENCOUNTER — Ambulatory Visit: Payer: Medicaid Other | Attending: Internal Medicine | Admitting: *Deleted

## 2014-10-03 VITALS — BP 109/72 | HR 72 | Temp 98.2°F | Resp 18

## 2014-10-03 DIAGNOSIS — I10 Essential (primary) hypertension: Secondary | ICD-10-CM | POA: Diagnosis not present

## 2014-10-03 DIAGNOSIS — Z720 Tobacco use: Secondary | ICD-10-CM | POA: Insufficient documentation

## 2014-10-03 MED ORDER — NICOTINE 7 MG/24HR TD PT24: 7.0000 mg | MEDICATED_PATCH | Freq: Every day | TRANSDERMAL | Status: DC

## 2014-10-03 NOTE — Telephone Encounter (Signed)
I attempted to call the  Dentist's office. The phone number that pt has given is the wrong phone number. Pt is aware. The phone # I found in line is  [phone]. The dentist's office is closed on [address].

## 2014-10-03 NOTE — Patient Instructions (Addendum)
DASH Eating Plan DASH stands for "Dietary Approaches to Stop Hypertension." The DASH eating plan is a healthy eating plan that has been shown to reduce high blood pressure (hypertension). Additional health benefits may include reducing the risk of type 2 diabetes mellitus, heart disease, and stroke. The DASH eating plan may also help with weight loss. WHAT DO I NEED TO KNOW ABOUT THE DASH EATING PLAN? For the DASH eating plan, you will follow these general guidelines:  Choose foods with a percent daily value for sodium of less than 5% (as listed on the food label).  Use salt-free seasonings or herbs instead of table salt or sea salt.  Check with your health care provider or pharmacist before using salt substitutes.  Eat lower-sodium products, often labeled as "lower sodium" or "no salt added."  Eat fresh foods.  Eat more vegetables, fruits, and low-fat dairy products.  Choose whole grains. Look for the word "whole" as the first word in the ingredient list.  Choose fish and skinless chicken or turkey more often than red meat. Limit fish, poultry, and meat to 6 oz (170 g) each day.  Limit sweets, desserts, sugars, and sugary drinks.  Choose heart-healthy fats.  Limit cheese to 1 oz (28 g) per day.  Eat more home-cooked food and less restaurant, buffet, and fast food.  Limit fried foods.  Cook foods using methods other than frying.  Limit canned vegetables. If you do use them, rinse them well to decrease the sodium.  When eating at a restaurant, ask that your food be prepared with less salt, or no salt if possible. WHAT FOODS CAN I EAT? Seek help from a dietitian for individual calorie needs. Grains Whole grain or whole wheat bread. Brown rice. Whole grain or whole wheat pasta. Quinoa, bulgur, and whole grain cereals. Low-sodium cereals. Corn or whole wheat flour tortillas. Whole grain cornbread. Whole grain crackers. Low-sodium crackers. Vegetables Fresh or frozen vegetables  (raw, steamed, roasted, or grilled). Low-sodium or reduced-sodium tomato and vegetable juices. Low-sodium or reduced-sodium tomato sauce and paste. Low-sodium or reduced-sodium canned vegetables.  Fruits All fresh, canned (in natural juice), or frozen fruits. Meat and Other Protein Products Ground beef (85% or leaner), grass-fed beef, or beef trimmed of fat. Skinless chicken or turkey. Ground chicken or turkey. Pork trimmed of fat. All fish and seafood. Eggs. Dried beans, peas, or lentils. Unsalted nuts and seeds. Unsalted canned beans. Dairy Low-fat dairy products, such as skim or 1% milk, 2% or reduced-fat cheeses, low-fat ricotta or cottage cheese, or plain low-fat yogurt. Low-sodium or reduced-sodium cheeses. Fats and Oils Tub margarines without trans fats. Light or reduced-fat mayonnaise and salad dressings (reduced sodium). Avocado. Safflower, olive, or canola oils. Natural peanut or almond butter. Other Unsalted popcorn and pretzels. The items listed above may not be a complete list of recommended foods or beverages. Contact your dietitian for more options. WHAT FOODS ARE NOT RECOMMENDED? Grains White bread. White pasta. White rice. Refined cornbread. Bagels and croissants. Crackers that contain trans fat. Vegetables Creamed or fried vegetables. Vegetables in a cheese sauce. Regular canned vegetables. Regular canned tomato sauce and paste. Regular tomato and vegetable juices. Fruits Dried fruits. Canned fruit in light or heavy syrup. Fruit juice. Meat and Other Protein Products Fatty cuts of meat. Ribs, chicken wings, bacon, sausage, bologna, salami, chitterlings, fatback, hot dogs, bratwurst, and packaged luncheon meats. Salted nuts and seeds. Canned beans with salt. Dairy Whole or 2% milk, cream, half-and-half, and cream cheese. Whole-fat or sweetened yogurt. Full-fat   cheeses or blue cheese. Nondairy creamers and whipped toppings. Processed cheese, cheese spreads, or cheese  curds. Condiments Onion and garlic salt, seasoned salt, table salt, and sea salt. Canned and packaged gravies. Worcestershire sauce. Tartar sauce. Barbecue sauce. Teriyaki sauce. Soy sauce, including reduced sodium. Steak sauce. Fish sauce. Oyster sauce. Cocktail sauce. Horseradish. Ketchup and mustard. Meat flavorings and tenderizers. Bouillon cubes. Hot sauce. Tabasco sauce. Marinades. Taco seasonings. Relishes. Fats and Oils Butter, stick margarine, lard, shortening, ghee, and bacon fat. Coconut, palm kernel, or palm oils. Regular salad dressings. Other Pickles and olives. Salted popcorn and pretzels. The items listed above may not be a complete list of foods and beverages to avoid. Contact your dietitian for more information. WHERE CAN I FIND MORE INFORMATION? National Heart, Lung, and [employer] Institute: www.nhlbi.nih.gov/health/health-topics/topics/dash/ Document Released: 04/28/2011 Document Revised: 09/23/2013 Document Reviewed: 03/13/2013 ExitCare Patient Information 2015 [employer]. This information is not intended to replace advice given to you by your health care provider. Make sure you discuss any questions you have with your health care provider. Smoking Cessation, Tips for Success If you are ready to quit smoking, congratulations! You have chosen to help yourself be healthier. Cigarettes bring nicotine, tar, carbon monoxide, and other irritants into your body. Your lungs, heart, and blood vessels will be able to work better without these poisons. There are many different ways to quit smoking. Nicotine gum, nicotine patches, a nicotine inhaler, or nicotine nasal spray can help with physical craving. Hypnosis, support groups, and medicines help break the habit of smoking. WHAT THINGS CAN I DO TO MAKE QUITTING EASIER?  Here are some tips to help you quit for good:  Pick a date when you will quit smoking completely. Tell all of your friends and family about your plan to quit on that  date.  Do not try to slowly cut down on the number of cigarettes you are smoking. Pick a quit date and quit smoking completely starting on that day.  Throw away all cigarettes.   Clean and remove all ashtrays from your home, work, and car.  On a card, write down your reasons for quitting. Carry the card with you and read it when you get the urge to smoke.  Cleanse your body of nicotine. Drink enough water and fluids to keep your urine clear or pale yellow. Do this after quitting to flush the nicotine from your body.  Learn to predict your moods. Do not let a bad situation be your excuse to have a cigarette. Some situations in your life might tempt you into wanting a cigarette.  Never have "just one" cigarette. It leads to wanting another and another. Remind yourself of your decision to quit.  Change habits associated with smoking. If you smoked while driving or when feeling stressed, try other activities to replace smoking. Stand up when drinking your coffee. Brush your teeth after eating. Sit in a different chair when you read the paper. Avoid alcohol while trying to quit, and try to drink fewer caffeinated beverages. Alcohol and caffeine may urge you to smoke.  Avoid foods and drinks that can trigger a desire to smoke, such as sugary or spicy foods and alcohol.  Ask people who smoke not to smoke around you.  Have something planned to do right after eating or having a cup of coffee. For example, plan to take a walk or exercise.  Try a relaxation exercise to calm you down and decrease your stress. Remember, you may be tense and nervous for the   first 2 weeks after you quit, but this will pass.  Find new activities to keep your hands busy. Play with a pen, coin, or rubber band. Doodle or draw things on paper.  Brush your teeth right after eating. This will help cut down on the craving for the taste of tobacco after meals. You can also try mouthwash.   Use oral substitutes in place of  cigarettes. Try using lemon drops, carrots, cinnamon sticks, or chewing gum. Keep them handy so they are available when you have the urge to smoke.  When you have the urge to smoke, try deep breathing.  Designate your home as a nonsmoking area.  If you are a heavy smoker, ask your health care provider about a prescription for nicotine chewing gum. It can ease your withdrawal from nicotine.  Reward yourself. Set aside the cigarette money you save and buy yourself something nice.  Look for support from others. Join a support group or smoking cessation program. Ask someone at home or at work to help you with your plan to quit smoking.  Always ask yourself, "Do I need this cigarette or is this just a reflex?" Tell yourself, "Today, I choose not to smoke," or "I do not want to smoke." You are reminding yourself of your decision to quit.  Do not replace cigarette smoking with electronic cigarettes (commonly called e-cigarettes). The safety of e-cigarettes is unknown, and some may contain harmful chemicals.  If you relapse, do not give up! Plan ahead and think about what you will do the next time you get the urge to smoke. HOW WILL I FEEL WHEN I QUIT SMOKING? You may have symptoms of withdrawal because your body is used to nicotine (the addictive substance in cigarettes). You may crave cigarettes, be irritable, feel very hungry, cough often, get headaches, or have difficulty concentrating. The withdrawal symptoms are only temporary. They are strongest when you first quit but will go away within 10-14 days. When withdrawal symptoms occur, stay in control. Think about your reasons for quitting. Remind yourself that these are signs that your body is healing and getting used to being without cigarettes. Remember that withdrawal symptoms are easier to treat than the major diseases that smoking can cause.  Even after the withdrawal is over, expect periodic urges to smoke. However, these cravings are generally  short lived and will go away whether you smoke or not. Do not smoke! WHAT RESOURCES ARE AVAILABLE TO HELP ME QUIT SMOKING? Your health care provider can direct you to community resources or hospitals for support, which may include:  Group support.  Education.  Hypnosis.  Therapy. Document Released: 02/05/2004 Document Revised: 09/23/2013 Document Reviewed: 10/25/2012 ExitCare Patient Information 2015 [employer]. This information is not intended to replace advice given to you by your health care provider. Make sure you discuss any questions you have with your health care provider. Smoking Cessation Quitting smoking is important to your health and has many advantages. However, it is not always easy to quit since nicotine is a very addictive drug. Oftentimes, people try 3 times or more before being able to quit. This document explains the best ways for you to prepare to quit smoking. Quitting takes hard work and a lot of effort, but you can do it. ADVANTAGES OF QUITTING SMOKING  You will live longer, feel better, and live better.  Your body will feel the impact of quitting smoking almost immediately.  Within 20 minutes, blood pressure decreases. Your pulse returns to its normal   level.  After 8 hours, carbon monoxide levels in the blood return to normal. Your oxygen level increases.  After 24 hours, the chance of having a heart attack starts to decrease. Your breath, hair, and body stop smelling like smoke.  After 48 hours, damaged nerve endings begin to recover. Your sense of taste and smell improve.  After 72 hours, the body is virtually free of nicotine. Your bronchial tubes relax and breathing becomes easier.  After 2 to 12 weeks, lungs can hold more air. Exercise becomes easier and circulation improves.  The risk of having a heart attack, stroke, cancer, or lung disease is greatly reduced.  After 1 year, the risk of coronary heart disease is cut in half.  After 5 years, the  risk of stroke falls to the same as a nonsmoker.  After 10 years, the risk of lung cancer is cut in half and the risk of other cancers decreases significantly.  After 15 years, the risk of coronary heart disease drops, usually to the level of a nonsmoker.  If you are pregnant, quitting smoking will improve your chances of having a healthy baby.  The people you live with, especially any children, will be healthier.  You will have extra money to spend on things other than cigarettes. QUESTIONS TO THINK ABOUT BEFORE ATTEMPTING TO QUIT You may want to talk about your answers with your health care provider.  Why do you want to quit?  If you tried to quit in the past, what helped and what did not?  What will be the most difficult situations for you after you quit? How will you plan to handle them?  Who can help you through the tough times? Your family? Friends? A health care provider?  What pleasures do you get from smoking? What ways can you still get pleasure if you quit? Here are some questions to ask your health care provider:  How can you help me to be successful at quitting?  What medicine do you think would be best for me and how should I take it?  What should I do if I need more help?  What is smoking withdrawal like? How can I get information on withdrawal? GET READY  Set a quit date.  Change your environment by getting rid of all cigarettes, ashtrays, matches, and lighters in your home, car, or work. Do not let people smoke in your home.  Review your past attempts to quit. Think about what worked and what did not. GET SUPPORT AND ENCOURAGEMENT You have a better chance of being successful if you have help. You can get support in many ways.  Tell your family, friends, and coworkers that you are going to quit and need their support. Ask them not to smoke around you.  Get individual, group, or telephone counseling and support. Programs are available at local hospitals and  health centers. Call your local health department for information about programs in your area.  Spiritual beliefs and practices may help some smokers quit.  Download a "quit meter" on your computer to keep track of quit statistics, such as how long you have gone without smoking, cigarettes not smoked, and money saved.  Get a self-help book about quitting smoking and staying off tobacco. LEARN NEW SKILLS AND BEHAVIORS  Distract yourself from urges to smoke. Talk to someone, go for a walk, or occupy your time with a task.  Change your normal routine. Take a different route to work. Drink tea instead of coffee. Eat   breakfast in a different place.  Reduce your stress. Take a hot bath, exercise, or read a book.  Plan something enjoyable to do every day. Reward yourself for not smoking.  Explore interactive web-based programs that specialize in helping you quit. GET MEDICINE AND USE IT CORRECTLY Medicines can help you stop smoking and decrease the urge to smoke. Combining medicine with the above behavioral methods and support can greatly increase your chances of successfully quitting smoking.  Nicotine replacement therapy helps deliver nicotine to your body without the negative effects and risks of smoking. Nicotine replacement therapy includes nicotine gum, lozenges, inhalers, nasal sprays, and skin patches. Some may be available over-the-counter and others require a prescription.  Antidepressant medicine helps people abstain from smoking, but how this works is unknown. This medicine is available by prescription.  Nicotinic receptor partial agonist medicine simulates the effect of nicotine in your brain. This medicine is available by prescription. Ask your health care provider for advice about which medicines to use and how to use them based on your health history. Your health care provider will tell you what side effects to look out for if you choose to be on a medicine or therapy. Carefully  read the information on the package. Do not use any other product containing nicotine while using a nicotine replacement product.  RELAPSE OR DIFFICULT SITUATIONS Most relapses occur within the first 3 months after quitting. Do not be discouraged if you start smoking again. Remember, most people try several times before finally quitting. You may have symptoms of withdrawal because your body is used to nicotine. You may crave cigarettes, be irritable, feel very hungry, cough often, get headaches, or have difficulty concentrating. The withdrawal symptoms are only temporary. They are strongest when you first quit, but they will go away within 10-14 days. To reduce the chances of relapse, try to:  Avoid drinking alcohol. Drinking lowers your chances of successfully quitting.  Reduce the amount of caffeine you consume. Once you quit smoking, the amount of caffeine in your body increases and can give you symptoms, such as a rapid heartbeat, sweating, and anxiety.  Avoid smokers because they can make you want to smoke.  Do not let weight gain distract you. Many smokers will gain weight when they quit, usually less than 10 pounds. Eat a healthy diet and stay active. You can always lose the weight gained after you quit.  Find ways to improve your mood other than smoking. FOR MORE INFORMATION  www.smokefree.gov  Document Released: 05/03/2001 Document Revised: 09/23/2013 Document Reviewed: 08/18/2011 ExitCare Patient Information 2015 [employer]. This information is not intended to replace advice given to you by your health care provider. Make sure you discuss any questions you have with your health care provider.  

## 2014-10-03 NOTE — Telephone Encounter (Signed)
Pt. Called to check on the status of his surgical clearance. Pt needs to have 9 teeth pulled in [facility] [place] by Dr Gae Bon Dentist; ph # [phone]. Pt states that his dentist has contact the office different times about the clearance and have had not heard from this office. Pt was made aware that according to our  Records, no phone calls are listed to have come from his dentist. Pt has an appointment in this office with Scott weaver on 10/30/14 at 2: pm pt is aware. This message will be send to Richardson Dopp PA for recommendations.

## 2014-10-03 NOTE — Telephone Encounter (Signed)
New Message  Pt called to check the status of surgery clearance; per pt- his dental office has tried to contact our office but has not heard anything in return. Please call back and dsicuss.

## 2014-10-03 NOTE — Telephone Encounter (Signed)
I am not aware of any request. Please contact dentist's office for request. If patient needs general anesthesia, he needs to be seen for evaluation. Richardson Dopp, PA-C   10/03/2014 10:16 AM

## 2014-10-03 NOTE — Progress Notes (Signed)
Patient presents for BP check after stopping lopressor Med list reviewed; states taking all meds as directed except did not take lisinopril this AM due to tooth pain Discussed need for low sodium diet and using Mrs. Dash as alternative to salt Encouraged to choose foods with 5% or less of daily value for sodium. Doing push ups and pull ups daily. Discussed walking 30 minutes per day in addition Patient denies SHOB, Positive for intermittant headaches, blurred vision, and right-sided chest pain but none at present C/o toothache for 3 weeks; rates 6/10 at present; followed by dental office Smoking .5 ppd; attempting to quit. Per PCP patient will begin nicoderm 7 mg as 14 mg caused burning  Filed Vitals:   10/03/14 1432  BP: 109/72  Pulse: 72  Temp: 98.2 F (36.8 C)  Resp: 18    Patient advised to call for med refills at least 7 days before running out so as not to go without.  Patient aware that he is to f/u with PCP 3 months from last visit (Due 11/24/14)  Patient given literature on DASH Eating Plan, Smoking Cessation and Smoking Cessation Tips

## 2014-10-13 ENCOUNTER — Telehealth: Payer: Self-pay | Admitting: Internal Medicine

## 2014-10-13 ENCOUNTER — Other Ambulatory Visit: Payer: Self-pay | Admitting: *Deleted

## 2014-10-13 NOTE — Telephone Encounter (Signed)
Patient called stating that when he has the smoking sensation patches it "burns". He would like to know of this is a reaction to the medication and weather he should keep applying the patches. Please f/u with pt.

## 2014-10-17 ENCOUNTER — Other Ambulatory Visit: Payer: Self-pay

## 2014-10-17 ENCOUNTER — Other Ambulatory Visit (INDEPENDENT_AMBULATORY_CARE_PROVIDER_SITE_OTHER): Payer: Medicaid Other | Admitting: *Deleted

## 2014-10-17 DIAGNOSIS — I2510 Atherosclerotic heart disease of native coronary artery without angina pectoris: Secondary | ICD-10-CM | POA: Diagnosis not present

## 2014-10-17 LAB — LIPID PANEL
Cholesterol: 131 mg/dL (ref 0–200)
HDL: 40.8 mg/dL (ref >39.00)
LDL CALC: 80 mg/dL (ref 0–99)
NonHDL: 90.2
Total CHOL/HDL Ratio: 3
Triglycerides: 52 mg/dL (ref 0.0–149.0)
VLDL: 10.4 mg/dL (ref 0.0–40.0)

## 2014-10-17 LAB — HEPATIC FUNCTION PANEL
ALBUMIN: 4.1 g/dL (ref 3.5–5.2)
ALT: 16 U/L (ref 0–53)
AST: 15 U/L (ref 0–37)
Alkaline Phosphatase: 83 U/L (ref 39–117)
BILIRUBIN TOTAL: 0.4 mg/dL (ref 0.2–1.2)
Bilirubin, Direct: 0.1 mg/dL (ref 0.0–0.3)
TOTAL PROTEIN: 7.4 g/dL (ref 6.0–8.3)

## 2014-10-17 MED ORDER — LISINOPRIL 40 MG PO TABS: 40.0000 mg | ORAL_TABLET | Freq: Every day | ORAL | Status: DC

## 2014-10-17 NOTE — Telephone Encounter (Signed)
called pt to let him know that we have tried to reach Dr. Lupita Leash office for his dental work. Advised pt to keep his appt with Brynda Rim. PA  6/9. Pt asked about a handicap placard application. I said he will need to d/w Brynda Rim. PA on 6/9, pt ok

## 2014-10-17 NOTE — Telephone Encounter (Signed)
Per note 12.22.15

## 2014-10-21 NOTE — Telephone Encounter (Signed)
Lm on voice mail for Dr. Hoyt Koch in regards to pt needing surg clearance and that we were not aware of this nor do we have a surgery clearance form. Lmom cb [phone].

## 2014-10-28 NOTE — Telephone Encounter (Signed)
Spoke to patient regarding his burning sensation while using his nicotine patches.  Explained to patient that the burning sensation is normal and should go away after about an hour.  I also explained that the skin below the patch may be red and irritated after he removes the patch but that it should get better shortly thereafter and that he should rotate the location of the patches every day.  Patient appreciated advice.

## 2014-10-30 ENCOUNTER — Ambulatory Visit: Payer: Self-pay | Admitting: Physician Assistant
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
## 2014-11-04 ENCOUNTER — Encounter: Payer: Self-pay | Admitting: Physician Assistant

## 2014-11-04 ENCOUNTER — Ambulatory Visit (INDEPENDENT_AMBULATORY_CARE_PROVIDER_SITE_OTHER): Payer: Medicaid Other | Admitting: Physician Assistant

## 2014-11-04 ENCOUNTER — Telehealth: Payer: Self-pay | Admitting: *Deleted

## 2014-11-04 VITALS — BP 120/64 | HR 66 | Ht 69.0 in | Wt 158.4 lb

## 2014-11-04 DIAGNOSIS — E785 Hyperlipidemia, unspecified: Secondary | ICD-10-CM | POA: Diagnosis not present

## 2014-11-04 DIAGNOSIS — Z720 Tobacco use: Secondary | ICD-10-CM | POA: Diagnosis not present

## 2014-11-04 DIAGNOSIS — I10 Essential (primary) hypertension: Secondary | ICD-10-CM | POA: Diagnosis not present

## 2014-11-04 DIAGNOSIS — I2510 Atherosclerotic heart disease of native coronary artery without angina pectoris: Secondary | ICD-10-CM | POA: Diagnosis not present

## 2014-11-04 DIAGNOSIS — Z01810 Encounter for preprocedural cardiovascular examination: Secondary | ICD-10-CM

## 2014-11-04 LAB — BASIC METABOLIC PANEL
BUN: 12 mg/dL (ref 6–23)
CHLORIDE: 102 meq/L (ref 96–112)
CO2: 28 meq/L (ref 19–32)
CREATININE: 0.92 mg/dL (ref 0.40–1.50)
Calcium: 9.5 mg/dL (ref 8.4–10.5)
GFR: 108.88 mL/min (ref >60.00)
Glucose, Bld: 88 mg/dL (ref 70–99)
Potassium: 4.1 mEq/L (ref 3.5–5.1)
Sodium: 135 mEq/L (ref 135–145)

## 2014-11-04 MED ORDER — METOPROLOL TARTRATE 25 MG PO TABS: 12.5000 mg | ORAL_TABLET | Freq: Every day | ORAL | Status: DC

## 2014-11-04 NOTE — Patient Instructions (Signed)
Medication Instructions:  1. A REFILL FOR METOPROLOL 12.5 MG TWICE DAILY HAS BEEN SENT IN  Labwork: TODAY BMET  Testing/Procedures: NONE  Follow-Up: 1 YEAR WITH DR. Burt Knack  Any Other Special Instructions Will Be Listed Below (If Applicable). YOU HAVE BEEN ADVISED TO FOLLOW UP WITH PRIMARY CARE ABOUT YOUR BLOOD PRESSURE, ED AND HEALTH MAINTANCE

## 2014-11-04 NOTE — Telephone Encounter (Signed)
Pt notified lab work normal. Pt verbalized understanding by phone to results

## 2014-11-04 NOTE — Telephone Encounter (Signed)
Pt saw Brynda Rim. PA which at this time Troy Mclaughlin d/w pt about his dental procedure upcoming. Brynda Rim. PA explained that he will fax over his ov note from today to Dr. Hoyt Koch.

## 2014-11-05 ENCOUNTER — Emergency Department (HOSPITAL_COMMUNITY)
Admission: EM | Admit: 2014-11-05 | Discharge: 2014-11-05 | Disposition: A | Discharge status: Home / Self Care | Payer: Medicaid Other | Attending: Emergency Medicine | Admitting: Emergency Medicine

## 2014-11-05 ENCOUNTER — Encounter (HOSPITAL_COMMUNITY): Payer: Self-pay | Admitting: Emergency Medicine

## 2014-11-05 ENCOUNTER — Telehealth: Payer: Self-pay | Admitting: Internal Medicine

## 2014-11-05 DIAGNOSIS — Z79899 Other long term (current) drug therapy: Secondary | ICD-10-CM | POA: Insufficient documentation

## 2014-11-05 DIAGNOSIS — I10 Essential (primary) hypertension: Secondary | ICD-10-CM | POA: Insufficient documentation

## 2014-11-05 DIAGNOSIS — Y9289 Other specified places as the place of occurrence of the external cause: Secondary | ICD-10-CM | POA: Insufficient documentation

## 2014-11-05 DIAGNOSIS — Z8719 Personal history of other diseases of the digestive system: Secondary | ICD-10-CM | POA: Insufficient documentation

## 2014-11-05 DIAGNOSIS — Y9389 Activity, other specified: Secondary | ICD-10-CM | POA: Insufficient documentation

## 2014-11-05 DIAGNOSIS — Y998 Other external cause status: Secondary | ICD-10-CM | POA: Diagnosis not present

## 2014-11-05 DIAGNOSIS — Z8669 Personal history of other diseases of the nervous system and sense organs: Secondary | ICD-10-CM | POA: Insufficient documentation

## 2014-11-05 DIAGNOSIS — Z720 Tobacco use: Secondary | ICD-10-CM | POA: Insufficient documentation

## 2014-11-05 DIAGNOSIS — Z9889 Other specified postprocedural states: Secondary | ICD-10-CM | POA: Diagnosis not present

## 2014-11-05 DIAGNOSIS — Z8673 Personal history of transient ischemic attack (TIA), and cerebral infarction without residual deficits: Secondary | ICD-10-CM | POA: Insufficient documentation

## 2014-11-05 DIAGNOSIS — Z8659 Personal history of other mental and behavioral disorders: Secondary | ICD-10-CM | POA: Diagnosis not present

## 2014-11-05 DIAGNOSIS — R220 Localized swelling, mass and lump, head: Secondary | ICD-10-CM | POA: Diagnosis present

## 2014-11-05 DIAGNOSIS — X58XXXA Exposure to other specified factors, initial encounter: Secondary | ICD-10-CM | POA: Diagnosis not present

## 2014-11-05 DIAGNOSIS — T783XXA Angioneurotic edema, initial encounter: Secondary | ICD-10-CM | POA: Insufficient documentation

## 2014-11-05 DIAGNOSIS — Z7982 Long term (current) use of aspirin: Secondary | ICD-10-CM | POA: Diagnosis not present

## 2014-11-05 MED ORDER — METHYLPREDNISOLONE SODIUM SUCC 125 MG IJ SOLR
125.0000 mg | Freq: Once | INTRAMUSCULAR | Status: AC
Start: 2014-11-05 — End: 2014-11-05
  Administered 2014-11-05: 125 mg via INTRAVENOUS
  Filled 2014-11-05: qty 2

## 2014-11-05 MED ORDER — DIPHENHYDRAMINE HCL 50 MG/ML IJ SOLN
25.0000 mg | Freq: Once | INTRAMUSCULAR | Status: AC
  Administered 2014-11-05: 25 mg via INTRAVENOUS
  Filled 2014-11-05: qty 1

## 2014-11-05 NOTE — ED Notes (Signed)
Pt reports waking this AM with lower lip swelling. Airway intact, NAD at present

## 2014-11-05 NOTE — ED Notes (Signed)
Pt placed in gown and in bed. Pt monitored by pulse ox and bp cuff. 

## 2014-11-05 NOTE — Discharge Instructions (Signed)
Your Lip swelling is from LISINOPRIL. You are allergic to this and should never take it again. Follow up as soon as possible with your primary doctor or cardiologist (call) and get a different blood pressure medicine     Angioedema Angioedema is a sudden swelling of tissues, often of the skin. It can occur on the face or genitals or in the abdomen or other body parts. The swelling usually develops over a short period and gets better in 24 to 48 hours. It often begins during the night and is found when the person wakes up. The person may also get red, itchy patches of skin (hives). Angioedema can be dangerous if it involves swelling of the air passages.  Depending on the cause, episodes of angioedema may only happen once, come back in unpredictable patterns, or repeat for several years and then gradually fade away.  CAUSES  Angioedema can be caused by an allergic reaction to various triggers. It can also result from nonallergic causes, including reactions to drugs, immune system disorders, viral infections, or an abnormal gene that is passed to you from your parents (hereditary). For some people with angioedema, the cause is unknown.  Some things that can trigger angioedema include:   Foods.   Medicines, such as ACE inhibitors, ARBs, nonsteroidal anti-inflammatory agents, or estrogen.   Latex.   Animal saliva.   Insect stings.   Dyes used in X-rays.   Mild injury.   Dental work.  Surgery.  Stress.   Sudden changes in temperature.   Exercise. SIGNS AND SYMPTOMS   Swelling of the skin.  Hives. If these are present, there is also intense itching.  Redness in the affected area.   Pain in the affected area.  Swollen lips or tongue.  Breathing problems. This may happen if the air passages swell.  Wheezing. If internal organs are involved, there may be:   Nausea.   Abdominal pain.   Vomiting.   Difficulty swallowing.   Difficulty passing  urine. DIAGNOSIS   Your health care provider will examine the affected area and take a medical and family history.  Various tests may be done to help determine the cause. Tests may include:  Allergy skin tests to see if the problem is an allergic reaction.   Blood tests to check for hereditary angioedema.   Tests to check for underlying diseases that could cause the condition.   A review of your medicines, including over-the-counter medicines, may be done. TREATMENT  Treatment will depend on the cause of the angioedema. Possible treatments include:   Removal of anything that triggered the condition (such as stopping certain medicines).   Medicines to treat symptoms or prevent attacks. Medicines given may include:   Antihistamines.   Epinephrine injection.   Steroids.   Hospitalization may be required for severe attacks. If the air passages are affected, it can be an emergency. Tubes may need to be placed to keep the airway open. HOME CARE INSTRUCTIONS   Take all medicines as directed by your health care provider.  If you were given medicines for emergency allergy treatment, always carry them with you.  Wear a medical bracelet as directed by your health care provider.   Avoid known triggers. SEEK MEDICAL CARE IF:   You have repeat attacks of angioedema.   Your attacks are more frequent or more severe despite preventive measures.   You have hereditary angioedema and are considering having children. It is important to discuss with your health care provider the risks  of passing the condition on to your children. SEEK IMMEDIATE MEDICAL CARE IF:   You have severe swelling of the mouth, tongue, or lips.  You have difficulty breathing.   You have difficulty swallowing.   You faint. MAKE SURE YOU:  Understand these instructions.  Will watch your condition.  Will get help right away if you are not doing well or get worse. Document Released: 07/18/2001  Document Revised: 09/23/2013 Document Reviewed: 12/31/2012 [facility] Patient Information 2015 [place], [place]. This information is not intended to replace advice given to you by your health care provider. Make sure you discuss any questions you have with your health care provider.

## 2014-11-05 NOTE — Telephone Encounter (Signed)
Pt was seen in hospital for lip swelling and was told to discontinue medication, please f/u with pt with instruction for how to proceed with medications.
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
## 2014-11-05 NOTE — ED Notes (Signed)
No difficulty breathing at this time.  Airway intact, no stridor noted.

## 2014-11-13 ENCOUNTER — Encounter: Payer: Self-pay | Admitting: Internal Medicine

## 2014-11-13 ENCOUNTER — Ambulatory Visit: Payer: Medicaid Other | Attending: Internal Medicine | Admitting: Internal Medicine

## 2014-11-13 VITALS — BP 122/67 | HR 96 | Temp 98.1°F | Resp 18 | Wt 156.6 lb

## 2014-11-13 DIAGNOSIS — Z720 Tobacco use: Secondary | ICD-10-CM

## 2014-11-13 DIAGNOSIS — F17200 Nicotine dependence, unspecified, uncomplicated: Secondary | ICD-10-CM

## 2014-11-13 DIAGNOSIS — R05 Cough: Secondary | ICD-10-CM | POA: Diagnosis not present

## 2014-11-13 DIAGNOSIS — Z1211 Encounter for screening for malignant neoplasm of colon: Secondary | ICD-10-CM

## 2014-11-13 DIAGNOSIS — R634 Abnormal weight loss: Secondary | ICD-10-CM

## 2014-11-13 DIAGNOSIS — N522 Drug-induced erectile dysfunction: Secondary | ICD-10-CM | POA: Diagnosis not present

## 2014-11-13 DIAGNOSIS — R059 Cough, unspecified: Secondary | ICD-10-CM

## 2014-11-13 DIAGNOSIS — Z125 Encounter for screening for malignant neoplasm of prostate: Secondary | ICD-10-CM

## 2014-11-13 NOTE — Patient Instructions (Signed)
I will check your blood levels and call you back. I will also get clearance from your cardiologist as well.

## 2014-11-13 NOTE — Progress Notes (Signed)
Pt is here for a Hospital F/up due to lip swelling. Pt would like to discuss starting Viagra. He thinks his blood pressure is messing with his sex life.

## 2014-11-13 NOTE — Progress Notes (Signed)
Patient ID: Troy Mclaughlin, male   DOB: 1955-12-09, 59 y.o.   MRN: [id number]  CC: ED follow up  HPI: Troy Mclaughlin is a 59 y.o. male here today for a follow up visit.  Patient has past medical history of hypertension and stroke. Patient was seen on 11/04/14 for angioedema in the ER. The angioedema was thought to be a result of Lisinopril use. The medication was discontinued at that time. Since discontinuation of medication he has no longer had concerns of swelling.   Today he is very concerned about continued erectile dysfunction. This issue was discussed back in April and was thought to be drug induced from his multiple anti-hypertensive medications. Today he would like to have a prescription for viagra to assist with his sex life.   He also reports weight loss over last couple of weeks. He has had a total of 6 pounds lost without trying. Has noticed a cough with some SOB with exertion.   Patient has No headache, No chest pain, No abdominal pain - No Nausea, No new weakness tingling or numbness.  Allergies  Allergen Reactions  . Lisinopril Swelling   Past Medical History  Diagnosis Date  . Hypertension   . Alcohol abuse   . Stroke 1970    tia  . Glaucoma   . History of stomach ulcers   . ST elevation    Current Outpatient Prescriptions on File Prior to Visit  Medication Sig Dispense Refill  . aspirin 81 MG EC tablet Take 1 tablet (81 mg total) by mouth daily. 60 tablet 5  . atorvastatin (LIPITOR) 40 MG tablet Take 1 tablet (40 mg total) by mouth daily. 30 tablet 11  . metoprolol tartrate (LOPRESSOR) 25 MG tablet Take 0.5 tablets (12.5 mg total) by mouth daily. 60 tablet 11  . nicotine (NICODERM CQ - DOSED IN MG/24 HR) 7 mg/24hr patch Place 1 patch (7 mg total) onto the skin daily. 21 patch 2   No current facility-administered medications on file prior to visit.   Family History  Problem Relation Age of Onset  . Heart attack Neg Hx   . Stroke Maternal Uncle   . Hypertension  Mother   . Cancer Mother   . Cancer Sister   . Hypertension Sister   . Cancer Father    History   Social History  . Marital Status: Married    Spouse Name: N/A  . Number of Children: N/A  . Years of Education: N/A   Occupational History  . Not on file.   Social History Main Topics  . Smoking status: Current Every Day Smoker    Types: Cigarettes  . Smokeless tobacco: Not on file     Comment: Smoking .5 ppd  . Alcohol Use: 18.0 oz/week    30 Cans of beer per week     Comment: heavy  . Drug Use: No  . Sexual Activity: Not Currently   Other Topics Concern  . Not on file   Social History Narrative    Review of Systems: See HPI.    Objective:   Filed Vitals:   11/13/14 1432  BP: 122/67  Pulse: 96  Temp: 98.1 F (36.7 C)  Resp: 18    Physical Exam  Constitutional: He is oriented to person, place, and time.  Cardiovascular: Normal rate, regular rhythm and normal heart sounds.   No murmur heard. Pulmonary/Chest: Effort normal and breath sounds normal. He has no wheezes.  Lymphadenopathy:    He has no cervical  adenopathy.  Neurological: He is alert and oriented to person, place, and time.     Lab Results  Component Value Date   WBC 3.9* 05/19/2014   HGB 12.9* 05/19/2014   HCT 38.2* 05/19/2014   MCV 70.7* 05/19/2014   PLT 185 05/19/2014   Lab Results  Component Value Date   CREATININE 0.92 11/04/2014   BUN 12 11/04/2014   NA 135 11/04/2014   K 4.1 11/04/2014   CL 102 11/04/2014   CO2 28 11/04/2014    Lab Results  Component Value Date   HGBA1C 6.2* 05/17/2014   Lipid Panel     Component Value Date/Time   CHOL 131 10/17/2014 0828   TRIG 52.0 10/17/2014 0828   HDL 40.80 10/17/2014 0828   CHOLHDL 3 10/17/2014 0828   VLDL 10.4 10/17/2014 0828   LDLCALC 80 10/17/2014 0828       Assessment and plan:   Sevan was seen today for follow-up and rx for viagra.  Diagnoses and all orders for this visit:  Cough Orders: -     DG Chest 2 View;  Future Smoking cessation discussed   Unintentional weight loss See above. Patient has a history of tobacco use. Will also refer patient for colonoscopy   Drug-induced erectile dysfunction Orders: -     Testosterone I will check with patient's cardiologist to see if he his heart is health enough for Viagra. At this point I do not see a reason he will be unable to use medication  Colon cancer screening Orders: -     [facility] COLONOSCOPY  Prostate cancer screening Orders: -     PSA  TOBACCO ABUSE Smoking cessation discussed for 3 minutes, patient is not willing to quit at this time. Will continue to assess on each visit. Discussed increased risk for diseases such as cancer, heart disease, and stroke.   Return in about 6 months (around 05/15/2015).       Chari Manning, NP-C [facility] [phone] 11/13/2014, 2:54 PM

## 2014-11-14 ENCOUNTER — Telehealth: Payer: Self-pay | Admitting: Internal Medicine

## 2014-11-14 LAB — PSA: PSA: 2.66 ng/mL (ref <=4.00)

## 2014-11-14 LAB — TESTOSTERONE: Testosterone: 262 ng/dL — ABNORMAL LOW (ref 300–890)

## 2014-11-14 NOTE — Telephone Encounter (Signed)
Called patient to notify application for disability parking placard is complete for pickup

## 2014-11-18 ENCOUNTER — Other Ambulatory Visit: Payer: Self-pay | Admitting: Internal Medicine

## 2014-11-18 DIAGNOSIS — R7989 Other specified abnormal findings of blood chemistry: Secondary | ICD-10-CM

## 2014-11-18 MED ORDER — TESTOSTERONE CYPIONATE 200 MG/ML IM SOLN: 200.0000 mg | INTRAMUSCULAR | Status: DC

## 2014-11-19 ENCOUNTER — Telehealth: Payer: Self-pay | Admitting: Physician Assistant

## 2014-11-19 NOTE — Telephone Encounter (Signed)
I s/w Troy Mclaughlin today at Dr. Parke Simmers office and they have been notified that in the office note that was faxed over ; Brynda Rim. PA stated in Assessment and Plan section listed as #5 pt does not need any further cardiac work up for his non cardiac procedure. Troy Mclaughlin apologized she did not see that and said thank you.

## 2014-11-19 NOTE — Telephone Encounter (Signed)
New Message        Office calling stating that they received pt's H&P from appt w/ Richardson Dopp but they need in writing that the pt is cleared for surgery. Please Fax [phone]

## 2014-12-19 ENCOUNTER — Ambulatory Visit: Payer: Medicaid Other | Attending: Internal Medicine | Admitting: Internal Medicine

## 2014-12-19 VITALS — BP 161/75 | HR 60 | Temp 98.2°F | Resp 18 | Ht 69.0 in | Wt 163.0 lb

## 2014-12-19 DIAGNOSIS — IMO0001 Reserved for inherently not codable concepts without codable children: Secondary | ICD-10-CM

## 2014-12-19 DIAGNOSIS — R030 Elevated blood-pressure reading, without diagnosis of hypertension: Secondary | ICD-10-CM | POA: Diagnosis not present

## 2014-12-19 DIAGNOSIS — E291 Testicular hypofunction: Secondary | ICD-10-CM

## 2014-12-19 DIAGNOSIS — R7989 Other specified abnormal findings of blood chemistry: Secondary | ICD-10-CM

## 2014-12-19 DIAGNOSIS — Z720 Tobacco use: Secondary | ICD-10-CM | POA: Insufficient documentation

## 2014-12-19 MED ORDER — NICOTINE 14 MG/24HR TD PT24: 14.0000 mg | MEDICATED_PATCH | Freq: Every day | TRANSDERMAL | Status: DC

## 2014-12-19 MED ORDER — TESTOSTERONE CYPIONATE 200 MG/ML IM SOLN: 200.0000 mg | INTRAMUSCULAR | Status: DC

## 2014-12-19 MED ORDER — TESTOSTERONE CYPIONATE 200 MG/ML IM SOLN
200.0000 mg | Freq: Once | INTRAMUSCULAR | Status: AC
  Administered 2014-12-19: 200 mg via INTRAMUSCULAR

## 2014-12-19 NOTE — Progress Notes (Signed)
Patient ID: Troy Mclaughlin, male   DOB: 09/04/1955, 60 y.o.   MRN: [id number]  CC: testosterone injection  HPI: Troy Mclaughlin is a 59 y.o. male here today for a testosterone injection.  Patient has past medical history of HTN, TIA, and glaucoma. Patient states that his BP is elevated because he is is aggravated and he missed his BP medication this morning. He reports that he has questions about testosterone and possible side effects. He is afraid that the injection will interfere with his tooth extraction surgery schedule for next week. He is requesting a refill of nicotine patches   Patient has No headache, No chest pain, No abdominal pain - No Nausea, No new weakness tingling or numbness, No Cough - SOB.  Allergies  Allergen Reactions  . Lisinopril Swelling   Past Medical History  Diagnosis Date  . Hypertension   . Alcohol abuse   . Stroke 1970    tia  . Glaucoma   . History of stomach ulcers   . ST elevation    Current Outpatient Prescriptions on File Prior to Visit  Medication Sig Dispense Refill  . aspirin 81 MG EC tablet Take 1 tablet (81 mg total) by mouth daily. 60 tablet 5  . atorvastatin (LIPITOR) 40 MG tablet Take 1 tablet (40 mg total) by mouth daily. 30 tablet 11  . metoprolol tartrate (LOPRESSOR) 25 MG tablet Take 0.5 tablets (12.5 mg total) by mouth daily. 60 tablet 11  . testosterone cypionate (DEPO-TESTOSTERONE) 200 MG/ML injection Inject 1 mL (200 mg total) into the muscle every 28 (twenty-eight) days. 10 mL 0  . nicotine (NICODERM CQ - DOSED IN MG/24 HR) 7 mg/24hr patch Place 1 patch (7 mg total) onto the skin daily. (Patient not taking: Reported on 12/19/2014) 21 patch 2   No current facility-administered medications on file prior to visit.   Family History  Problem Relation Age of Onset  . Heart attack Neg Hx   . Stroke Maternal Uncle   . Hypertension Mother   . Cancer Mother   . Cancer Sister   . Hypertension Sister   . Cancer Father    History   Social  History  . Marital Status: Married    Spouse Name: N/A  . Number of Children: N/A  . Years of Education: N/A   Occupational History  . Not on file.   Social History Main Topics  . Smoking status: Current Every Day Smoker -- 0.50 packs/day    Types: Cigarettes  . Smokeless tobacco: Not on file     Comment: Smoking .5 ppd  . Alcohol Use: No  . Drug Use: No  . Sexual Activity: Not Currently   Other Topics Concern  . Not on file   Social History Narrative    Review of Systems: See HPI   Objective:   Filed Vitals:   12/19/14 1124  BP: 156/104  Pulse: 60  Temp: 98.2 F (36.8 C)  Resp: 18    Physical Exam  Constitutional: He is oriented to person, place, and time.  Neurological: He is alert and oriented to person, place, and time.  Psychiatric:  frustrated     Lab Results  Component Value Date   WBC 3.9* 05/19/2014   HGB 12.9* 05/19/2014   HCT 38.2* 05/19/2014   MCV 70.7* 05/19/2014   PLT 185 05/19/2014   Lab Results  Component Value Date   CREATININE 0.92 11/04/2014   BUN 12 11/04/2014   NA 135 11/04/2014   K  4.1 11/04/2014   CL 102 11/04/2014   CO2 28 11/04/2014    Lab Results  Component Value Date   HGBA1C 6.2* 05/17/2014   Lipid Panel     Component Value Date/Time   CHOL 131 10/17/2014 0828   TRIG 52.0 10/17/2014 0828   HDL 40.80 10/17/2014 0828   CHOLHDL 3 10/17/2014 0828   VLDL 10.4 10/17/2014 0828   LDLCALC 80 10/17/2014 0828       Assessment and plan:   Troy Mclaughlin was seen today for injections.  Diagnoses and all orders for this visit:  Low testosterone Orders: -     testosterone cypionate (DEPOTESTOSTERONE CYPIONATE) injection 200 mg; Inject 1 mL (200 mg total) into the muscle once in office  -     testosterone cypionate (DEPO-TESTOSTERONE) 200 MG/ML injection; Inject 1 mL (200 mg total) into the muscle every 28 (twenty-eight) days. 2 more refills given Will recheck his testosterone level 30 days after 3rd injection   Elevated  BP Patient will take medication when he gets home  Tobacco abuse Orders: -     Refill nicotine (NICODERM CQ - DOSED IN MG/24 HOURS) 14 mg/24hr patch; Place 1 patch (14 mg total) onto the skin daily.   Return in about 4 weeks (around 01/16/2015) for Nurse Visit-testosterone injec.        Lance Bosch, [facility] [phone] 12/19/2014, 11:42 AM

## 2014-12-19 NOTE — Progress Notes (Signed)
Patient here for testosterone injection.  Patient has questions about testosterone.   Patient would like nicotine questions.

## 2014-12-24 ENCOUNTER — Telehealth: Payer: Self-pay | Admitting: Physician Assistant

## 2014-12-24 NOTE — Telephone Encounter (Signed)
New message      Pt want to know if you will mail him some stop smoking patches.  He got them from [place] before and he does not have transportation to come in

## 2014-12-24 NOTE — Telephone Encounter (Signed)
Pt advised that he will need to s/w PCP about needing further refills on smoking patches. I stated to pt that we may start pt's on a medication to get them started however if not cardiac med then advised to f/u w/.PCP. Pt said ok and thank you.

## 2014-12-31 ENCOUNTER — Encounter (HOSPITAL_COMMUNITY): Payer: Self-pay

## 2014-12-31 ENCOUNTER — Encounter (HOSPITAL_COMMUNITY)
Admission: RE | Admit: 2014-12-31 | Discharge: 2014-12-31 | Disposition: A | Discharge status: Home / Self Care | Payer: Medicaid Other | Source: Ambulatory Visit | Attending: Oral Surgery | Admitting: Oral Surgery

## 2014-12-31 DIAGNOSIS — K089 Disorder of teeth and supporting structures, unspecified: Secondary | ICD-10-CM | POA: Insufficient documentation

## 2014-12-31 DIAGNOSIS — Z01812 Encounter for preprocedural laboratory examination: Secondary | ICD-10-CM | POA: Insufficient documentation

## 2014-12-31 HISTORY — DX: Accidental discharge from unspecified firearms or gun, initial encounter: W34.00XA

## 2014-12-31 HISTORY — DX: Acute myocardial infarction, unspecified: I21.9

## 2014-12-31 HISTORY — DX: Reserved for concepts with insufficient information to code with codable children: IMO0002

## 2014-12-31 HISTORY — DX: Gastro-esophageal reflux disease without esophagitis: K21.9

## 2014-12-31 HISTORY — DX: Pneumonia, unspecified organism: J18.9

## 2014-12-31 HISTORY — DX: Headache: R51

## 2014-12-31 HISTORY — DX: Headache, unspecified: R51.9

## 2014-12-31 LAB — BASIC METABOLIC PANEL
Anion gap: 7 (ref 5–15)
BUN: 9 mg/dL (ref 6–20)
CALCIUM: 9.3 mg/dL (ref 8.9–10.3)
CO2: 28 mmol/L (ref 22–32)
CREATININE: 1.31 mg/dL — AB (ref 0.61–1.24)
Chloride: 104 mmol/L (ref 101–111)
GFR, EST NON AFRICAN AMERICAN: 59 mL/min — AB (ref >60)
Glucose, Bld: 100 mg/dL — ABNORMAL HIGH (ref 65–99)
Potassium: 4.3 mmol/L (ref 3.5–5.1)
Sodium: 139 mmol/L (ref 135–145)

## 2014-12-31 LAB — CBC
HCT: 42.5 % (ref 39.0–52.0)
Hemoglobin: 14.4 g/dL (ref 13.0–17.0)
MCH: 24.9 pg — ABNORMAL LOW (ref 26.0–34.0)
MCHC: 33.9 g/dL (ref 30.0–36.0)
MCV: 73.5 fL — ABNORMAL LOW (ref 78.0–100.0)
PLATELETS: 228 10*3/uL (ref 150–400)
RBC: 5.78 MIL/uL (ref 4.22–5.81)
RDW: 15.4 % (ref 11.5–15.5)
WBC: 6.9 10*3/uL (ref 4.0–10.5)

## 2014-12-31 NOTE — Pre-Procedure Instructions (Signed)
Troy Mclaughlin  12/31/2014      [facility] - [place], [place] AT [place] [address] Phone: [phone] Fax: [place], [place] [address] [place] [address] Phone: [phone] Fax: [phone]    Your procedure is scheduled on August 12th, Friday.   Report to [facility] Admitting at 5:30 AM   Call this number if you have problems the morning of surgery:  [phone]   Remember:  Do not eat food or drink liquids after midnight Thursday.  Take these medicines the morning of surgery with A SIP OF WATER: Metoprolol.    Do not wear jewelry - no rings or watches.  Do not wear lotions or colognes.   You may NOT wear deodorant the morning of surgery.             Men may shave face and neck.   Do not bring valuables to the hospital.  [facility] is not responsible for any belongings or valuables.  Contacts, dentures or bridgework may not be worn into surgery.  Leave your suitcase in the car.  After surgery it may be brought to your room. For patients admitted to the hospital, discharge time will be determined by your treatment team.  Patients discharged the day of surgery will not be allowed to drive home.   Name and phone number of your driver:    Special instructions:  "Preparing for Surgery" instruction sheet.  Please read over the following fact sheets that you were given. Pain Booklet and Surgical Site Infection Prevention

## 2014-12-31 NOTE — Pre-Procedure Instructions (Signed)
Troy Mclaughlin  12/31/2014      Your procedure is scheduled on Friday, January 02, 2015 at 7:30 AM.   Report to [facility] Entrance "A" Admitting Office at 5:30 AM.   Call this number if you have problems the morning of surgery: [phone]    Remember:  Do not eat food or drink liquids after midnight Thursday, 01/01/15.  Take these medicines the morning of surgery with A SIP OF WATER: Metoprolol (Lopressor)  Stop Aspirin as of today.    Do not wear jewelry.  Do not wear lotions, powders, or cologne.  You may wear deodorant.  Men may shave face and neck.  Do not bring valuables to the hospital.  [facility] is not responsible for any belongings or valuables.  Contacts, dentures or bridgework may not be worn into surgery.  Leave your suitcase in the car.  After surgery it may be brought to your room.  For patients admitted to the hospital, discharge time will be determined by your treatment team.  Patients discharged the day of surgery will not be allowed to drive home.   Special instructions:  [place] - Preparing for Surgery  Before surgery, you can play an important role.  Because skin is not sterile, your skin needs to be as free of germs as possible.  You can reduce the number of germs on you skin by washing with CHG (chlorahexidine gluconate) soap before surgery.  CHG is an antiseptic cleaner which kills germs and bonds with the skin to continue killing germs even after washing.  Please DO NOT use if you have an allergy to CHG or antibacterial soaps.  If your skin becomes reddened/irritated stop using the CHG and inform your nurse when you arrive at Short Stay.  Do not shave (including legs and underarms) for at least 48 hours prior to the first CHG shower.  You may shave your face.  Please follow these instructions carefully:   1.  Shower with CHG Soap the night before surgery and the                                morning of Surgery.  2.  If you choose  to wash your hair, wash your hair first as usual with your       normal shampoo.  3.  After you shampoo, rinse your hair and body thoroughly to remove the                      Shampoo.  4.  Use CHG as you would any other liquid soap.  You can apply chg directly       to the skin and wash gently with scrungie or a clean washcloth.  5.  Apply the CHG Soap to your body ONLY FROM THE NECK DOWN.        Do not use on open wounds or open sores.  Avoid contact with your eyes, ears, mouth and genitals (private parts).  Wash genitals (private parts) with your normal soap.  6.  Wash thoroughly, paying special attention to the area where your surgery        will be performed.  7.  Thoroughly rinse your body with warm water from the neck down.  8.  DO NOT shower/wash with your normal soap after using and rinsing off       the CHG Soap.  9.  Pat yourself dry with a clean towel.            10.  Wear clean pajamas.            11.  Place clean sheets on your bed the night of your first shower and do not        sleep with pets.  Day of Surgery  Do not apply any lotions the morning of surgery.  Please wear clean clothes to the hospital.    Please read over the following fact sheets that you were given. Pain Booklet, Coughing and Deep Breathing and Surgical Site Infection Prevention

## 2014-12-31 NOTE — Progress Notes (Signed)
Cardiologist: Dr. Legrand Como cooper/ Richardson Dopp PA

## 2014-12-31 NOTE — Progress Notes (Signed)
Pt denies any recent chest pain or sob.  

## 2014-12-31 NOTE — H&P (Signed)
HISTORY AND PHYSICAL  Troy Mclaughlin is a 59 y.o. male patient referred by general dentist for multiple extractions.  No diagnosis found.  Past Medical History  Diagnosis Date  . Hypertension   . Alcohol abuse   . Stroke 1970    tia  . Glaucoma   . History of stomach ulcers   . ST elevation     No current facility-administered medications for this encounter.   Current Outpatient Prescriptions  Medication Sig Dispense Refill  . aspirin 81 MG EC tablet Take 1 tablet (81 mg total) by mouth daily. 60 tablet 5  . atorvastatin (LIPITOR) 40 MG tablet Take 1 tablet (40 mg total) by mouth daily. (Patient taking differently: Take 40 mg by mouth every evening. ) 30 tablet 11  . metoprolol tartrate (LOPRESSOR) 25 MG tablet Take 0.5 tablets (12.5 mg total) by mouth daily. 60 tablet 11  . testosterone cypionate (DEPO-TESTOSTERONE) 200 MG/ML injection Inject 1 mL (200 mg total) into the muscle every 28 (twenty-eight) days. 10 mL 1  . nicotine (NICODERM CQ - DOSED IN MG/24 HOURS) 14 mg/24hr patch Place 1 patch (14 mg total) onto the skin daily. (Patient not taking: Reported on 12/25/2014) 14 patch 1   Allergies  Allergen Reactions  . Lisinopril Swelling   Active Problems:   * No active hospital problems. *  Vitals: There were no vitals taken for this visit. Lab results:No results found for this or any previous visit (from the past 34 hour(s)). Radiology Results: No results found. General appearance: alert and cooperative Head: Normocephalic, without obvious abnormality, atraumatic Eyes: negative Nose: Nares normal. Septum midline. Mucosa normal. No drainage or sinus tenderness. Throat: multiple carious teeth; no trismus, purulence, or fluctuance Neck: no adenopathy, supple, symmetrical, trachea midline and thyroid not enlarged, symmetric, no tenderness/mass/nodules Resp: clear to auscultation bilaterally Cardio: regular rate and rhythm, S1, S2 normal, no murmur, click, rub or  gallop  Assessment:Nonrestorabl teeth E's 1, 2, 8, 14, 16, 17, 29, 30, 32 secondary to dental caries  Plan:Dental extractions. General anesthesia. Day surgery.   Simren Popson M 12/31/2014

## 2015-01-01 MED ORDER — CEFAZOLIN SODIUM-DEXTROSE 2-3 GM-% IV SOLR
2.0000 g | INTRAVENOUS | Status: AC
Start: 2015-01-02 — End: 2015-01-02
  Administered 2015-01-02: 2 g via INTRAVENOUS
  Filled 2015-01-01: qty 50

## 2015-01-02 ENCOUNTER — Ambulatory Visit (HOSPITAL_COMMUNITY)
Admission: RE | Admit: 2015-01-02 | Discharge: 2015-01-02 | Disposition: A | Discharge status: Home / Self Care | Payer: Medicaid Other | Source: Ambulatory Visit | Attending: Oral Surgery | Admitting: Oral Surgery

## 2015-01-02 ENCOUNTER — Encounter (HOSPITAL_COMMUNITY)
Admission: RE | Disposition: A | Discharge status: Home / Self Care | Payer: Self-pay | Source: Ambulatory Visit | Attending: Oral Surgery

## 2015-01-02 ENCOUNTER — Ambulatory Visit (HOSPITAL_COMMUNITY): Payer: Medicaid Other | Admitting: Anesthesiology

## 2015-01-02 ENCOUNTER — Encounter (HOSPITAL_COMMUNITY): Payer: Self-pay | Admitting: *Deleted

## 2015-01-02 DIAGNOSIS — K029 Dental caries, unspecified: Secondary | ICD-10-CM | POA: Insufficient documentation

## 2015-01-02 DIAGNOSIS — Z7982 Long term (current) use of aspirin: Secondary | ICD-10-CM | POA: Diagnosis not present

## 2015-01-02 DIAGNOSIS — Z8673 Personal history of transient ischemic attack (TIA), and cerebral infarction without residual deficits: Secondary | ICD-10-CM | POA: Insufficient documentation

## 2015-01-02 DIAGNOSIS — I10 Essential (primary) hypertension: Secondary | ICD-10-CM | POA: Diagnosis not present

## 2015-01-02 HISTORY — PX: TOOTH EXTRACTION: SHX859

## 2015-01-02 SURGERY — DENTAL RESTORATION/EXTRACTIONS
Anesthesia: General | Site: Mouth

## 2015-01-02 MED ORDER — HYDRALAZINE HCL 20 MG/ML IJ SOLN
INTRAMUSCULAR | Status: DC | PRN
  Administered 2015-01-02: 10 mg via INTRAVENOUS

## 2015-01-02 MED ORDER — SODIUM CHLORIDE 0.9 % IR SOLN
Status: DC | PRN
Start: 2015-01-02 — End: 2015-01-02
  Administered 2015-01-02: 1

## 2015-01-02 MED ORDER — ONDANSETRON HCL 4 MG/2ML IJ SOLN
INTRAMUSCULAR | Status: DC | PRN
Start: 2015-01-02 — End: 2015-01-02
  Administered 2015-01-02: 4 mg via INTRAVENOUS

## 2015-01-02 MED ORDER — PROPOFOL 10 MG/ML IV BOLUS
INTRAVENOUS | Status: AC
  Filled 2015-01-02: qty 20

## 2015-01-02 MED ORDER — MIDAZOLAM HCL 5 MG/5ML IJ SOLN
INTRAMUSCULAR | Status: DC | PRN
  Administered 2015-01-02: 2 mg via INTRAVENOUS

## 2015-01-02 MED ORDER — HYDRALAZINE HCL 20 MG/ML IJ SOLN
INTRAMUSCULAR | Status: AC
  Filled 2015-01-02: qty 1

## 2015-01-02 MED ORDER — LACTATED RINGERS IV SOLN
INTRAVENOUS | Status: DC | PRN
  Administered 2015-01-02 (×2): via INTRAVENOUS

## 2015-01-02 MED ORDER — FENTANYL CITRATE (PF) 250 MCG/5ML IJ SOLN
INTRAMUSCULAR | Status: AC
  Filled 2015-01-02: qty 5

## 2015-01-02 MED ORDER — 0.9 % SODIUM CHLORIDE (POUR BTL) OPTIME
TOPICAL | Status: DC | PRN
  Administered 2015-01-02: 1000 mL

## 2015-01-02 MED ORDER — LIDOCAINE HCL (CARDIAC) 20 MG/ML IV SOLN
INTRAVENOUS | Status: AC
  Filled 2015-01-02: qty 5

## 2015-01-02 MED ORDER — LIDOCAINE-EPINEPHRINE 2 %-1:100000 IJ SOLN
INTRAMUSCULAR | Status: AC
  Filled 2015-01-02: qty 1

## 2015-01-02 MED ORDER — PROPOFOL 10 MG/ML IV BOLUS
INTRAVENOUS | Status: DC | PRN
  Administered 2015-01-02: 40 mg via INTRAVENOUS
  Administered 2015-01-02: 160 mg via INTRAVENOUS

## 2015-01-02 MED ORDER — MIDAZOLAM HCL 2 MG/2ML IJ SOLN
INTRAMUSCULAR | Status: AC
  Filled 2015-01-02: qty 4

## 2015-01-02 MED ORDER — ONDANSETRON HCL 4 MG/2ML IJ SOLN
INTRAMUSCULAR | Status: AC
  Filled 2015-01-02: qty 2

## 2015-01-02 MED ORDER — SUCCINYLCHOLINE CHLORIDE 20 MG/ML IJ SOLN
INTRAMUSCULAR | Status: DC | PRN
  Administered 2015-01-02: 60 mg via INTRAVENOUS

## 2015-01-02 MED ORDER — OXYMETAZOLINE HCL 0.05 % NA SOLN
NASAL | Status: AC
  Filled 2015-01-02: qty 15

## 2015-01-02 MED ORDER — LIDOCAINE-EPINEPHRINE 2 %-1:100000 IJ SOLN
INTRAMUSCULAR | Status: DC | PRN
  Administered 2015-01-02: 16 mL

## 2015-01-02 MED ORDER — LIDOCAINE HCL (CARDIAC) 20 MG/ML IV SOLN
INTRAVENOUS | Status: DC | PRN
  Administered 2015-01-02: 60 mg via INTRAVENOUS

## 2015-01-02 MED ORDER — OXYCODONE-ACETAMINOPHEN 5-325 MG PO TABS
ORAL_TABLET | ORAL | Status: AC
  Filled 2015-01-02: qty 1

## 2015-01-02 MED ORDER — OXYCODONE-ACETAMINOPHEN 5-325 MG PO TABS
1.0000 | ORAL_TABLET | ORAL | Status: DC | PRN
  Administered 2015-01-02: 1 via ORAL

## 2015-01-02 MED ORDER — FENTANYL CITRATE (PF) 100 MCG/2ML IJ SOLN
INTRAMUSCULAR | Status: DC | PRN
  Administered 2015-01-02: 100 ug via INTRAVENOUS
  Administered 2015-01-02: 50 ug via INTRAVENOUS

## 2015-01-02 MED ORDER — OXYCODONE-ACETAMINOPHEN 5-325 MG PO TABS: 1.0000 | ORAL_TABLET | ORAL | Status: DC | PRN

## 2015-01-02 MED ORDER — SUCCINYLCHOLINE CHLORIDE 20 MG/ML IJ SOLN
INTRAMUSCULAR | Status: AC
  Filled 2015-01-02: qty 1

## 2015-01-02 SURGICAL SUPPLY — 32 items
BLADE 10 SAFETY STRL DISP (BLADE) ×3 IMPLANT
BLADE SURG 15 STRL LF DISP TIS (BLADE) IMPLANT
BLADE SURG 15 STRL SS (BLADE) ×3
BUR CROSS CUT FISSURE 1.6 (BURR) ×1 IMPLANT
BUR CROSS CUT FISSURE 1.6MM (BURR) ×1
BUR EGG ELITE 4.0 (BURR) ×2 IMPLANT
BUR EGG ELITE 4.0MM (BURR) ×1
CANISTER SUCTION 2500CC (MISCELLANEOUS) ×3 IMPLANT
COVER SURGICAL LIGHT HANDLE (MISCELLANEOUS) ×3 IMPLANT
GAUZE PACKING FOLDED 2  STR (GAUZE/BANDAGES/DRESSINGS) ×2
GAUZE PACKING FOLDED 2 STR (GAUZE/BANDAGES/DRESSINGS) ×1 IMPLANT
GLOVE BIO SURGEON STRL SZ 6.5 (GLOVE) ×2 IMPLANT
GLOVE BIO SURGEON STRL SZ7 (GLOVE) ×2 IMPLANT
GLOVE BIO SURGEON STRL SZ7.5 (GLOVE) ×3 IMPLANT
GLOVE BIO SURGEONS STRL SZ 6.5 (GLOVE) ×1
GLOVE BIOGEL PI IND STRL 7.0 (GLOVE) ×1 IMPLANT
GLOVE BIOGEL PI INDICATOR 7.0 (GLOVE) ×6
GOWN STRL REUS W/ TWL LRG LVL3 (GOWN DISPOSABLE) ×1 IMPLANT
GOWN STRL REUS W/ TWL XL LVL3 (GOWN DISPOSABLE) ×1 IMPLANT
GOWN STRL REUS W/TWL LRG LVL3 (GOWN DISPOSABLE) ×3
GOWN STRL REUS W/TWL XL LVL3 (GOWN DISPOSABLE) ×3
KIT BASIN OR (CUSTOM PROCEDURE TRAY) ×3 IMPLANT
KIT ROOM TURNOVER OR (KITS) ×3 IMPLANT
NEEDLE 22X1 1/2 (OR ONLY) (NEEDLE) ×3 IMPLANT
NS IRRIG 1000ML POUR BTL (IV SOLUTION) ×3 IMPLANT
PAD ARMBOARD 7.5X6 YLW CONV (MISCELLANEOUS) ×3 IMPLANT
SPONGE SURGIFOAM ABS GEL 12-7 (HEMOSTASIS) IMPLANT
SUT CHROMIC 3 0 PS 2 (SUTURE) ×4 IMPLANT
TOWEL OR 17X24 6PK STRL BLUE (TOWEL DISPOSABLE) ×3 IMPLANT
TRAY ENT MC OR (CUSTOM PROCEDURE TRAY) ×3 IMPLANT
TUBING IRRIGATION (MISCELLANEOUS) ×3 IMPLANT
YANKAUER SUCT BULB TIP NO VENT (SUCTIONS) ×3 IMPLANT

## 2015-01-02 NOTE — Op Note (Signed)
NAME:  Troy Mclaughlin, Troy Mclaughlin NO.:  [id number]  MEDICAL RECORD NO.:  [id number]  LOCATION:  [facility]                         FACILITY:  [facility]  PHYSICIAN:  Gae Bon, M.D.  DATE OF BIRTH:  1955-07-25  DATE OF PROCEDURE: DATE OF DISCHARGE:                              OPERATIVE REPORT   PREOPERATIVE DIAGNOSIS:  Nonrestorable teeth numbers 1, 2, 8, 14, 16, 17, 29, 30, 32 secondary to dental caries.  POSTOPERATIVE DIAGNOSIS:  Nonrestorable teeth numbers 1, 2, 8, 14, 16, 17, 29, 30, 32 secondary to dental caries.  PROCEDURE:  Extraction of teeth numbers 1, 2, 8, 14, 16, 17, 29, 30, 32.  SURGEON:  Gae Bon, MD  ANESTHESIA:  General nasal intubation.  PROCEDURE IN DETAIL:  The patient was taken to the operating room, placed on the table in supine position.  General anesthesia was administered intravenously and a nasal endotracheal tube was placed and secured.  The eyes were protected and the patient was draped for the procedure.  Time-out was performed.  The posterior pharynx was suctioned.  A throat pack was placed.  A 2% lidocaine with 1:100,000 epinephrine was infiltrated, right and left inferior alveolar block, in the mandible and in buccal and palatal infiltration around the maxillary teeth to be removed, total of 16 mL was utilized.  A bite block was placed in the right side of the mouth and Sweetheart retractor was used to retract the tongue.  A #15 blade was used to make an incision around teeth numbers 17 in the mandible and 14 and 16 in the maxilla.  The periosteum was reflected from around these teeth and the teeth were elevated with a 301 dental forceps.  Bone was removed buccally around tooth #17 and then the teeth were extracted with the dental forceps. The distal buccal root fractured upon removal of tooth #16 and then this root was removed with a root tip pick.  Then the areas were irrigated and no sutures were needed.  The Sweetheart retractor  and bite block were repositioned to the other side of the mouth and attention was turned to the right side.  A 15 blade was used to make an incision around teeth numbers 1, 2, 8 in the maxilla and around teeth numbers 29, 30, and 32 in the mandible.  The periosteum was reflected with a periosteal elevator.  The teeth were elevated with a 301 elevator.  Bone was removed around tooth #32 with a Stryker handpiece under irrigation. Then, the teeth were removed using the dental forceps.  The sockets were then curetted and granulation tissue was removed and the areas were sutured with 3-0 chromic after extraction.  The oral cavity was then irrigated and suctioned.  Throat pack was removed.  The patient was awakened taken to the recovery room, breathing spontaneously, in good condition.  ESTIMATED BLOOD LOSS:  Minimal.  COMPLICATIONS:  None.  SPECIMENS:  None.     Gae Bon, M.D.     SMJ/MEDQ  D:  01/02/2015  T:  01/02/2015  Job:  [id number]

## 2015-01-02 NOTE — Anesthesia Procedure Notes (Signed)
Procedure Name: Intubation Date/Time: 01/02/2015 7:49 AM Performed by: Terrill Mohr Pre-anesthesia Checklist: Patient identified, Emergency Drugs available, Suction available and Patient being monitored Patient Re-evaluated:Patient Re-evaluated prior to inductionOxygen Delivery Method: Circle system utilized Preoxygenation: Pre-oxygenation with 100% oxygen Intubation Type: IV induction Ventilation: Mask ventilation without difficulty Laryngoscope Size: Mac and 4 Grade View: Grade II Nasal Tubes: Right and Magill forceps- large, utilized Tube size: 7.0 mm Number of attempts: 2 (KBJ unable to advance; Dr. Ermalene Postin completed) Placement Confirmation: ETT inserted through vocal cords under direct vision,  breath sounds checked- equal and bilateral and positive ETCO2 Tube secured with: Tape (taped across nose) Dental Injury: Teeth and Oropharynx as per pre-operative assessment

## 2015-01-02 NOTE — Transfer of Care (Signed)
Immediate Anesthesia Transfer of Care Note  Patient: Troy Mclaughlin  Procedure(s) Performed: Procedure(s): MULTIPLE EXTRACTIONS OF TEETH 1,2,8,14,16,17,29,30,32 (N/A)  Patient Location: PACU  Anesthesia Type:General  Level of Consciousness: awake, alert  and patient cooperative  Airway & Oxygen Therapy: Patient Spontanous Breathing and Patient connected to face mask oxygen  Post-op Assessment: Report given to RN, Post -op Vital signs reviewed and stable and Patient moving all extremities  Post vital signs: Reviewed and stable  Last Vitals:  Filed Vitals:   01/02/15 0645  BP: 181/108  Pulse:   Temp:   Resp:     Complications: No apparent anesthesia complications

## 2015-01-02 NOTE — Anesthesia Preprocedure Evaluation (Addendum)
Anesthesia Evaluation  Patient identified by MRN, date of birth, ID band Patient awake    Reviewed: Allergy & Precautions, NPO status , Patient's Chart, lab work & pertinent test results, reviewed documented beta blocker date and time   History of Anesthesia Complications Negative for: history of anesthetic complications  Airway Mallampati: II  TM Distance: >3 FB Neck ROM: Full    Dental  (+) Missing   Pulmonary Current Smoker,  1/2 pack cigs/day breath sounds clear to auscultation        Cardiovascular hypertension, Pt. on home beta blockers and Pt. on medications + CAD and + Past MI Rhythm:Regular     Neuro/Psych  Headaches, PSYCHIATRIC DISORDERS CVA    GI/Hepatic Neg liver ROS, GERD-  ,  Endo/Other  negative endocrine ROS  Renal/GU negative Renal ROS     Musculoskeletal negative musculoskeletal ROS (+)   Abdominal   Peds  Hematology negative hematology ROS (+)   Anesthesia Other Findings Missing some upper front teeth.  Pt denies any loose teeth.  Reproductive/Obstetrics                            Anesthesia Physical Anesthesia Plan  ASA: III  Anesthesia Plan: General   Post-op Pain Management:    Induction: Intravenous  Airway Management Planned: Oral ETT and Nasal ETT  Additional Equipment: None  Intra-op Plan:   Post-operative Plan: Extubation in OR  Informed Consent: I have reviewed the patients History and Physical, chart, labs and discussed the procedure including the risks, benefits and alternatives for the proposed anesthesia with the patient or authorized representative who has indicated his/her understanding and acceptance.   Dental advisory given  Plan Discussed with: CRNA and Surgeon  Anesthesia Plan Comments:         Anesthesia Quick Evaluation

## 2015-01-02 NOTE — H&P (Signed)
H&P documentation  -History and Physical Reviewed  -Patient has been re-examined  -No change in the plan of care  Troy Mclaughlin  

## 2015-01-02 NOTE — Op Note (Signed)
01/02/2015  8:20 AM  PATIENT:  Kathalene Frames [place]  59 y.o. male  PRE-OPERATIVE DIAGNOSIS:  NON RESTORABLE TEETH  #1,2,8,14,16,17,29,30,32 SECONDARY TO DENTAL CARIES  POST-OPERATIVE DIAGNOSIS:  SAME  PROCEDURE:  Procedure(s): MULTIPLE EXTRACTIONS OF TEETH 1,2,8,14,16,17,29,30,32  SURGEON:  Surgeon(s): Diona Browner, DDS  ANESTHESIA:   local and general  EBL:  minimal  DRAINS: none   SPECIMEN:  No Specimen  COUNTS:  YES  PLAN OF CARE: Discharge to home after PACU  PATIENT DISPOSITION:  PACU - hemodynamically stable.   PROCEDURE DETAILS: Dictation # [id number]  Gae Bon, DMD 01/02/2015 8:20 AM

## 2015-01-04 NOTE — Anesthesia Postprocedure Evaluation (Signed)
  Anesthesia Post-op Note  Patient: Troy Mclaughlin  Procedure(s) Performed: Procedure(s): MULTIPLE EXTRACTIONS OF TEETH 1,2,8,14,16,17,29,30,32 (N/A)  Patient Location: PACU  Anesthesia Type:General  Level of Consciousness: awake  Airway and Oxygen Therapy: Patient Spontanous Breathing  Post-op Pain: mild  Post-op Assessment: Post-op Vital signs reviewed, Patient's Cardiovascular Status Stable, Respiratory Function Stable, Patent Airway, No signs of Nausea or vomiting and Pain level controlled              Post-op Vital Signs: Reviewed and stable  Last Vitals:  Filed Vitals:   01/02/15 0915  BP: 147/87  Pulse: 77  Temp:   Resp: 16    Complications: No apparent anesthesia complications

## 2015-01-05 ENCOUNTER — Encounter (HOSPITAL_COMMUNITY): Payer: Self-pay | Admitting: Oral Surgery

## 2015-01-19 ENCOUNTER — Ambulatory Visit: Payer: Medicaid Other | Attending: Internal Medicine | Admitting: *Deleted

## 2015-01-19 DIAGNOSIS — E291 Testicular hypofunction: Secondary | ICD-10-CM | POA: Diagnosis present

## 2015-01-19 DIAGNOSIS — R7989 Other specified abnormal findings of blood chemistry: Secondary | ICD-10-CM

## 2015-01-19 MED ORDER — TESTOSTERONE CYPIONATE 200 MG/ML IM SOLN
200.0000 mg | Freq: Once | INTRAMUSCULAR | Status: AC
  Administered 2015-01-19: 200 mg via INTRAMUSCULAR

## 2015-01-19 NOTE — Progress Notes (Signed)
Patient presents for Depo Testosterone injection States feeling well Last injection received 12/19/14 Next injection due 02/16/2015

## 2015-02-19 ENCOUNTER — Ambulatory Visit: Payer: Medicaid Other | Attending: Internal Medicine

## 2015-02-19 VITALS — BP 140/88 | HR 53 | Temp 97.7°F | Resp 18 | Ht 69.0 in | Wt 159.0 lb

## 2015-02-19 DIAGNOSIS — E291 Testicular hypofunction: Secondary | ICD-10-CM | POA: Diagnosis present

## 2015-02-19 DIAGNOSIS — R7989 Other specified abnormal findings of blood chemistry: Secondary | ICD-10-CM

## 2015-02-19 DIAGNOSIS — Z0000 Encounter for general adult medical examination without abnormal findings: Secondary | ICD-10-CM | POA: Diagnosis not present

## 2015-02-19 MED ORDER — TESTOSTERONE CYPIONATE 200 MG/ML IM SOLN
200.0000 mg | Freq: Once | INTRAMUSCULAR | Status: AC
  Administered 2015-02-19: 200 mg via INTRAMUSCULAR

## 2015-02-19 NOTE — Progress Notes (Signed)
Patient here for 3rd testosterone injection. Patient feels well today.  Patient would like to get flu shot today.

## 2015-04-06 ENCOUNTER — Ambulatory Visit: Payer: Self-pay

## 2015-04-09 ENCOUNTER — Encounter (HOSPITAL_COMMUNITY): Payer: Self-pay | Admitting: Family Medicine

## 2015-04-09 ENCOUNTER — Emergency Department (HOSPITAL_COMMUNITY)
Admission: EM | Admit: 2015-04-09 | Discharge: 2015-04-09 | Disposition: A | Discharge status: Home / Self Care | Payer: Medicaid Other | Attending: Emergency Medicine | Admitting: Emergency Medicine

## 2015-04-09 ENCOUNTER — Emergency Department (HOSPITAL_COMMUNITY): Payer: Medicaid Other

## 2015-04-09 DIAGNOSIS — R000 Tachycardia, unspecified: Secondary | ICD-10-CM | POA: Insufficient documentation

## 2015-04-09 DIAGNOSIS — J159 Unspecified bacterial pneumonia: Secondary | ICD-10-CM | POA: Diagnosis not present

## 2015-04-09 DIAGNOSIS — H409 Unspecified glaucoma: Secondary | ICD-10-CM | POA: Diagnosis not present

## 2015-04-09 DIAGNOSIS — Z7982 Long term (current) use of aspirin: Secondary | ICD-10-CM | POA: Diagnosis not present

## 2015-04-09 DIAGNOSIS — I252 Old myocardial infarction: Secondary | ICD-10-CM | POA: Diagnosis not present

## 2015-04-09 DIAGNOSIS — Z8673 Personal history of transient ischemic attack (TIA), and cerebral infarction without residual deficits: Secondary | ICD-10-CM | POA: Insufficient documentation

## 2015-04-09 DIAGNOSIS — R059 Cough, unspecified: Secondary | ICD-10-CM

## 2015-04-09 DIAGNOSIS — F17210 Nicotine dependence, cigarettes, uncomplicated: Secondary | ICD-10-CM | POA: Diagnosis not present

## 2015-04-09 DIAGNOSIS — K219 Gastro-esophageal reflux disease without esophagitis: Secondary | ICD-10-CM | POA: Diagnosis not present

## 2015-04-09 DIAGNOSIS — J189 Pneumonia, unspecified organism: Secondary | ICD-10-CM

## 2015-04-09 DIAGNOSIS — Z8701 Personal history of pneumonia (recurrent): Secondary | ICD-10-CM | POA: Insufficient documentation

## 2015-04-09 DIAGNOSIS — Z79899 Other long term (current) drug therapy: Secondary | ICD-10-CM | POA: Diagnosis not present

## 2015-04-09 DIAGNOSIS — I10 Essential (primary) hypertension: Secondary | ICD-10-CM | POA: Diagnosis not present

## 2015-04-09 DIAGNOSIS — R509 Fever, unspecified: Secondary | ICD-10-CM

## 2015-04-09 DIAGNOSIS — R05 Cough: Secondary | ICD-10-CM

## 2015-04-09 DIAGNOSIS — R52 Pain, unspecified: Secondary | ICD-10-CM | POA: Diagnosis present

## 2015-04-09 LAB — CBC WITH DIFFERENTIAL/PLATELET
Basophils Absolute: 0 10*3/uL (ref 0.0–0.1)
Basophils Relative: 0 %
EOS ABS: 0.1 10*3/uL (ref 0.0–0.7)
Eosinophils Relative: 1 %
HEMATOCRIT: 52.3 % — AB (ref 39.0–52.0)
HEMOGLOBIN: 14.8 g/dL (ref 13.0–17.0)
LYMPHS ABS: 1.1 10*3/uL (ref 0.7–4.0)
LYMPHS PCT: 9 %
MCH: 24.7 pg — AB (ref 26.0–34.0)
MCHC: 28.3 g/dL — ABNORMAL LOW (ref 30.0–36.0)
MCV: 87.5 fL (ref 78.0–100.0)
MONOS PCT: 9 %
Monocytes Absolute: 1.2 10*3/uL — ABNORMAL HIGH (ref 0.1–1.0)
NEUTROS ABS: 10.3 10*3/uL — AB (ref 1.7–7.7)
NEUTROS PCT: 81 %
Platelets: 156 10*3/uL (ref 150–400)
RBC: 5.98 MIL/uL — ABNORMAL HIGH (ref 4.22–5.81)
RDW: 16.7 % — ABNORMAL HIGH (ref 11.5–15.5)
WBC: 12.7 10*3/uL — ABNORMAL HIGH (ref 4.0–10.5)

## 2015-04-09 LAB — BASIC METABOLIC PANEL
Anion gap: 11 (ref 5–15)
BUN: 9 mg/dL (ref 6–20)
CHLORIDE: 101 mmol/L (ref 101–111)
CO2: 19 mmol/L — AB (ref 22–32)
Calcium: 8.9 mg/dL (ref 8.9–10.3)
Creatinine, Ser: 1.03 mg/dL (ref 0.61–1.24)
GFR calc Af Amer: >60 mL/min (ref >60)
GFR calc non Af Amer: >60 mL/min (ref >60)
Glucose, Bld: 103 mg/dL — ABNORMAL HIGH (ref 65–99)
POTASSIUM: 4.4 mmol/L (ref 3.5–5.1)
Sodium: 131 mmol/L — ABNORMAL LOW (ref 135–145)

## 2015-04-09 LAB — INFLUENZA PANEL BY PCR (TYPE A & B)
H1N1 flu by pcr: NOT DETECTED
INFLAPCR: NEGATIVE
Influenza B By PCR: NEGATIVE

## 2015-04-09 LAB — I-STAT CG4 LACTIC ACID, ED: LACTIC ACID, VENOUS: 1.96 mmol/L (ref 0.5–2.0)

## 2015-04-09 MED ORDER — ALBUTEROL SULFATE HFA 108 (90 BASE) MCG/ACT IN AERS
2.0000 | INHALATION_SPRAY | Freq: Once | RESPIRATORY_TRACT | Status: AC
  Administered 2015-04-09: 2 via RESPIRATORY_TRACT
  Filled 2015-04-09: qty 6.7

## 2015-04-09 MED ORDER — LEVOFLOXACIN 750 MG PO TABS
750.0000 mg | ORAL_TABLET | Freq: Once | ORAL | Status: AC
  Administered 2015-04-09: 750 mg via ORAL
  Filled 2015-04-09: qty 1

## 2015-04-09 MED ORDER — SODIUM CHLORIDE 0.9 % IV BOLUS (SEPSIS)
1000.0000 mL | Freq: Once | INTRAVENOUS | Status: AC
  Administered 2015-04-09: 1000 mL via INTRAVENOUS

## 2015-04-09 MED ORDER — LEVOFLOXACIN 750 MG PO TABS: 750.0000 mg | ORAL_TABLET | Freq: Every day | ORAL | Status: DC

## 2015-04-09 MED ORDER — ACETAMINOPHEN 325 MG PO TABS
650.0000 mg | ORAL_TABLET | Freq: Once | ORAL | Status: AC
  Administered 2015-04-09: 650 mg via ORAL
  Filled 2015-04-09: qty 2

## 2015-04-09 MED ORDER — IBUPROFEN 600 MG PO TABS: 600.0000 mg | ORAL_TABLET | Freq: Four times a day (QID) | ORAL | Status: DC | PRN

## 2015-04-09 MED ORDER — KETOROLAC TROMETHAMINE 15 MG/ML IJ SOLN
15.0000 mg | Freq: Once | INTRAMUSCULAR | Status: AC
  Administered 2015-04-09: 15 mg via INTRAVENOUS
  Filled 2015-04-09: qty 1

## 2015-04-09 MED ORDER — ACETAMINOPHEN 325 MG PO TABS: 650.0000 mg | ORAL_TABLET | Freq: Four times a day (QID) | ORAL | Status: DC | PRN

## 2015-04-09 NOTE — Discharge Instructions (Signed)

## 2015-04-09 NOTE — ED Notes (Signed)
Pt presents from home via POV with c/o influenza-like illness - fevers, chills, generalized body aches, nausea, dry cough, nasal congestion.  Pt appears in NAD.
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
## 2015-04-21 ENCOUNTER — Ambulatory Visit: Payer: Medicaid Other | Attending: Internal Medicine

## 2015-04-21 ENCOUNTER — Ambulatory Visit: Payer: Medicaid Other

## 2015-04-21 DIAGNOSIS — R7989 Other specified abnormal findings of blood chemistry: Secondary | ICD-10-CM

## 2015-04-22 ENCOUNTER — Telehealth: Payer: Self-pay

## 2015-04-22 LAB — TESTOSTERONE: Testosterone: 518 ng/dL (ref 300–890)

## 2015-04-22 NOTE — Telephone Encounter (Signed)
-----   Message from Lance Bosch, NP sent at 04/22/2015  8:03 AM EST ----- Testosterone is back up to normal

## 2015-04-22 NOTE — Telephone Encounter (Signed)
Patient not available Unable to leave voice mail Mail box not set up

## 2015-04-30 NOTE — Telephone Encounter (Signed)
Patient came into facility to request his results, please f/u

## 2015-05-01 ENCOUNTER — Telehealth: Payer: Self-pay

## 2015-05-01 NOTE — Telephone Encounter (Signed)
Returned patient phone call and he is aware his testosterone is Back to normal

## 2015-05-26 ENCOUNTER — Ambulatory Visit: Payer: Self-pay | Admitting: Internal Medicine

## 2015-05-27 ENCOUNTER — Ambulatory Visit: Payer: Medicaid Other | Attending: Internal Medicine | Admitting: Internal Medicine

## 2015-05-27 ENCOUNTER — Encounter: Payer: Self-pay | Admitting: Internal Medicine

## 2015-05-27 VITALS — BP 165/100 | HR 90 | Temp 97.6°F | Resp 16 | Ht 69.0 in | Wt 161.4 lb

## 2015-05-27 DIAGNOSIS — I10 Essential (primary) hypertension: Secondary | ICD-10-CM | POA: Diagnosis not present

## 2015-05-27 DIAGNOSIS — G4700 Insomnia, unspecified: Secondary | ICD-10-CM | POA: Diagnosis not present

## 2015-05-27 DIAGNOSIS — R030 Elevated blood-pressure reading, without diagnosis of hypertension: Secondary | ICD-10-CM

## 2015-05-27 DIAGNOSIS — IMO0001 Reserved for inherently not codable concepts without codable children: Secondary | ICD-10-CM

## 2015-05-27 MED ORDER — CLONIDINE HCL 0.1 MG PO TABS
0.1000 mg | ORAL_TABLET | Freq: Once | ORAL | Status: AC
  Administered 2015-05-27: 0.1 mg via ORAL

## 2015-05-27 MED ORDER — METOPROLOL TARTRATE 25 MG PO TABS: 25.0000 mg | ORAL_TABLET | Freq: Every day | ORAL | Status: DC

## 2015-05-27 NOTE — Progress Notes (Signed)
Patient here for follow up on his HTN Presents in office with elevated blood pressure Complains of having some headaches the past couple of days catapress 0.1mg  given per office protocol

## 2015-05-27 NOTE — Progress Notes (Signed)
Patient ID: Troy Mclaughlin, male   DOB: [birth year], 58 y.o.   MRN: [id number] Subjective:  Troy Mclaughlin is a 60 y.o. male with hypertension. Reports that he takes his Metoprolol daily which causes him to have a bad taste in his mouth. He has notices some headaches and is unsure if it is due to elevated pressures. He does not check his pressures at home.  Patient complains of not sleeping well at night. He does feel more stressed about getting a job and paying bills. He reports sleeping on a couple of hours per night. He denies feels of depression or hopelessness.   Current Outpatient Prescriptions  Medication Sig Dispense Refill  . aspirin 81 MG EC tablet Take 1 tablet (81 mg total) by mouth daily. 60 tablet 5  . atorvastatin (LIPITOR) 40 MG tablet Take 1 tablet (40 mg total) by mouth daily. (Patient taking differently: Take 40 mg by mouth every evening. ) 30 tablet 11  . metoprolol tartrate (LOPRESSOR) 25 MG tablet Take 0.5 tablets (12.5 mg total) by mouth daily. 60 tablet 11  . acetaminophen (TYLENOL) 325 MG tablet Take 2 tablets (650 mg total) by mouth every 6 (six) hours as needed for mild pain or fever. 30 tablet 0  . ibuprofen (ADVIL,MOTRIN) 600 MG tablet Take 1 tablet (600 mg total) by mouth every 6 (six) hours as needed for fever, mild pain or moderate pain. 30 tablet 0  . nicotine (NICODERM CQ - DOSED IN MG/24 HR) 7 mg/24hr patch Place 7 mg onto the skin daily.    [name] oxyCODONE-acetaminophen (PERCOCET) 5-325 MG per tablet Take 1-2 tablets by mouth every 4 (four) hours as needed for severe pain. 40 tablet 0  . testosterone cypionate (DEPO-TESTOSTERONE) 200 MG/ML injection Inject 1 mL (200 mg total) into the muscle every 28 (twenty-eight) days. 10 mL 1   No current facility-administered medications for this visit.    Hypertension ROS: taking medications as instructed, no medication side effects noted, no TIA's, no chest pain on exertion, no dyspnea on exertion, no swelling of ankles and no  palpitations. All other systems negative other than what is stated  Objective:  BP 162/103 mmHg  Pulse 98  Temp(Src) 97.6 F (36.4 C)  Resp 16  Ht 5\' 9"  (1.753 m)  Wt 161 lb 6.4 oz (73.211 kg)  BMI 23.82 kg/m2  SpO2 100%  Appearance alert, well appearing, and in no distress, oriented to person, place, and time and normal appearing weight. General exam BP noted to be moderately elevated today in office, S1, S2 normal, no gallop, no murmur, chest clear, no JVD, no HSM, no edema.  Lab review: labs are reviewed, up to date and normal. Last LDL was 09/2014 and was at goal of 80.   Assessment:   Troy Mclaughlin was seen today for follow-up.  Diagnoses and all orders for this visit:  Elevated blood pressure -     cloNIDine (CATAPRES) tablet 0.1 mg; Take 1 tablet (0.1 mg total) by mouth once in office Patient encouraged to take 25 mg of Metoprolol tonight.   Essential hypertension -     metoprolol tartrate (LOPRESSOR) 25 MG tablet; Take 1 tablet (25 mg total) by mouth daily. Patients BP is elevated in office and has been on previous visit. I have increased his Metoprolol to 25 mg daily. He will return in 1 week for a recheck. DASH diet and exercise advised.  Insomnia Patient may try OTC Melatonin 5 mg nightly to help with sleep.  Return in about 1 week (around 06/03/2015) for Nurse Visit-BP check and 3 mo PCP .   Lance Bosch, NP 05/28/2015 8:49 AM

## 2015-05-27 NOTE — Patient Instructions (Addendum)
Biotene mouth rinse to help with dry mouth due to medication   I have changed your BP medication. Metoprolol tartrate---this will now be a 25 mg tablet so now you will take the whole pill only once per day.  I need you back for a BP recheck.  Atorvastatin is for your cholesterol. That is once per day as well.

## 2015-05-28 ENCOUNTER — Telehealth: Payer: Self-pay

## 2015-05-28 NOTE — Telephone Encounter (Signed)
-----   Message from Lance Bosch, NP sent at 05/27/2015  5:15 PM EST ----- Forgot to tell Troy Mclaughlin to try OTC Melatonin 5 mg at bedtime to see if that helps with sleep. Let him know it is natural and will not react with his meds. He can get at any pharmacy in the vitamin section

## 2015-05-28 NOTE — Telephone Encounter (Signed)
Called patient per provider Patient is aware he can take melatonin 5 mg at [facility] to help him sleep Patient also is aware it is OTC and can be picked up at any pharmacy

## 2015-06-03 ENCOUNTER — Ambulatory Visit: Payer: Medicaid Other | Attending: Internal Medicine | Admitting: Pharmacist

## 2015-06-03 ENCOUNTER — Encounter: Payer: Self-pay | Admitting: Pharmacist

## 2015-06-03 VITALS — BP 146/92 | HR 90

## 2015-06-03 DIAGNOSIS — I10 Essential (primary) hypertension: Secondary | ICD-10-CM

## 2015-06-03 DIAGNOSIS — Z79899 Other long term (current) drug therapy: Secondary | ICD-10-CM | POA: Insufficient documentation

## 2015-06-03 MED ORDER — METOPROLOL SUCCINATE ER 25 MG PO TB24: 25.0000 mg | ORAL_TABLET | Freq: Every day | ORAL | Status: DC

## 2015-06-03 NOTE — Progress Notes (Signed)
S:    Patient arrives in good spirits.  Presents to the clinic for hypertension evaluation.   Patient reports adherence with medications. He reports that he takes his metoprolol at night.   Current BP Medications include:  Metoprolol tartrate 25 mg daily.   Antihypertensives tried in the past include: lisinopril (swelling)  Patient reports that he feels like his heart is racing part of the day. He also does not like the taste of the metoprolol.    O:   Last 3 Office BP readings: BP Readings from Last 3 Encounters:  06/03/15 146/92  05/27/15 165/100  04/09/15 121/86    BMET    Component Value Date/Time   NA 131* 04/09/2015 0851   K 4.4 04/09/2015 0851   CL 101 04/09/2015 0851   CO2 19* 04/09/2015 0851   GLUCOSE 103* 04/09/2015 0851   BUN 9 04/09/2015 0851   CREATININE 1.03 04/09/2015 0851   CALCIUM 8.9 04/09/2015 0851   GFRNONAA >60 04/09/2015 0851   GFRAA >60 04/09/2015 0851    A/P: History of hypertension currently UNcontrolled on current medications. I think this is due to patient being on metoprolol tartrate and taking it at night so it is wearing off in the morning which is why the patient only feels bad part of the day. Will order metoprolol succinate 25 mg daily and can titrate as needed. I think the change in the formulation may help with the taste as well.   Reviewed all of the patient's medications with him. Results reviewed and written information provided.   Total time in face-to-face counseling 20 minutes.  F/U Clinic Visit with me in 2 weeks for blood pressure check.

## 2015-06-03 NOTE — Patient Instructions (Signed)
Thanks for coming to see me!  Stop the metoprolol TARTRATE and start the metoprolol SUCCINATE.   Just take it once a day whatever time works best for you - but take it at the same time every day  Come back and see me in 2 weeks for blood pressure and heart rate

## 2015-06-17 ENCOUNTER — Encounter: Payer: Self-pay | Admitting: Pharmacist

## 2015-07-01 ENCOUNTER — Ambulatory Visit: Payer: Medicaid Other | Attending: Internal Medicine | Admitting: Pharmacist

## 2015-07-01 ENCOUNTER — Encounter: Payer: Self-pay | Admitting: Pharmacist

## 2015-07-01 VITALS — BP 159/96 | HR 61

## 2015-07-01 DIAGNOSIS — I10 Essential (primary) hypertension: Secondary | ICD-10-CM

## 2015-07-01 MED ORDER — AMLODIPINE BESYLATE 5 MG PO TABS: 5.0000 mg | ORAL_TABLET | Freq: Every day | ORAL | Status: DC

## 2015-07-01 NOTE — Progress Notes (Signed)
S:    Patient arrives in good spirits.  Presents to the clinic for hypertension evaluation.   Patient reports adherence with medications. He reports that he takes his metoprolol at night.   Current BP Medications include:  Metoprolol tartrate 25 mg daily.   Antihypertensives tried in the past include: lisinopril (swelling)  Patient reports that he feels like his heart is racing part of the day. He doesn't think that the new medication is working - only for me to discover the he is still taking the metoprolol tartrate once daily and that [facility] did not give him the succinate.   O:   Last 3 Office BP readings: BP Readings from Last 3 Encounters:  07/01/15 159/96  06/03/15 146/92  05/27/15 165/100    BMET    Component Value Date/Time   NA 131* 04/09/2015 0851   K 4.4 04/09/2015 0851   CL 101 04/09/2015 0851   CO2 19* 04/09/2015 0851   GLUCOSE 103* 04/09/2015 0851   BUN 9 04/09/2015 0851   CREATININE 1.03 04/09/2015 0851   CALCIUM 8.9 04/09/2015 0851   GFRNONAA >60 04/09/2015 0851   GFRAA >60 04/09/2015 0851    A/P: History of hypertension currently UNcontrolled on current medications. Patient is not taking the succinate but is still taking metoprolol tartrate. However, I do not think that at this point, him taking the succinate will lower his blood pressure enough since his last dose of tartrate was this morning.  Initiate amlodipine 5 mg daily. Counseled on adverse effects, including hypotension and lower extremity edema. Also instructed patient to take medication once a day and at the same time. Patient to pick up the metoprolol succinate 25 mg daily and start that. He will dispose of the metoprolol tartrate (they apparently have a disposal bin at [employer]).   Results reviewed and written information provided.  Total time in face-to-face counseling 20 minutes.  F/U Clinic Visit with me in 2 weeks for blood pressure check.

## 2015-07-01 NOTE — Patient Instructions (Signed)
Thanks for coming to see me  Get the metoprolol succinate - this is the one that you only have to take once a day. The one you have is metoprolol tartrate - stop this one  Start amlodipine 5 mg daily - this will help to get your blood pressure down.  Come back in 2 weeks for a blood pressure check

## 2015-07-03 ENCOUNTER — Telehealth: Payer: Self-pay

## 2015-07-03 ENCOUNTER — Telehealth: Payer: Self-pay | Admitting: Internal Medicine

## 2015-07-03 NOTE — Telephone Encounter (Signed)
Pt. Called stating that he was prescribed amlodipine and he said that it is giving him headaches. Please f/u with pt.

## 2015-07-03 NOTE — Telephone Encounter (Signed)
Returned phone call to patient ] Patient states the new medication prescribed to him(amlodipine) Is giving him headaches Instructed patient to schedule an appointment to come back in with [facility] Call transferred to schedule appointment

## 2015-07-15 ENCOUNTER — Ambulatory Visit: Payer: Medicaid Other | Attending: Internal Medicine | Admitting: Pharmacist

## 2015-07-15 ENCOUNTER — Encounter: Payer: Self-pay | Admitting: Pharmacist

## 2015-07-15 VITALS — BP 164/88 | HR 92

## 2015-07-15 DIAGNOSIS — I10 Essential (primary) hypertension: Secondary | ICD-10-CM | POA: Diagnosis not present

## 2015-07-15 DIAGNOSIS — Z79899 Other long term (current) drug therapy: Secondary | ICD-10-CM | POA: Insufficient documentation

## 2015-07-15 MED ORDER — AMLODIPINE BESYLATE 10 MG PO TABS: 10.0000 mg | ORAL_TABLET | Freq: Every day | ORAL | Status: DC

## 2015-07-15 MED ORDER — CYCLOBENZAPRINE HCL 5 MG PO TABS
5.0000 mg | ORAL_TABLET | Freq: Three times a day (TID) | ORAL | Status: DC | PRN
Start: 2015-07-15 — End: 2015-12-08

## 2015-07-15 MED ORDER — DICLOFENAC SODIUM 1 % TD GEL: 2.0000 g | Freq: Four times a day (QID) | TRANSDERMAL | Status: DC

## 2015-07-15 NOTE — Patient Instructions (Signed)
Thank you for coming to see me!  Increase the amlodipine to 10 mg daily. You can take 2 pills of the 5 mg tablets but I sent the new prescription to the pharmacy  Use the diclofenac (Voltaren) gel and apply it your back to help with the pain  You can also take cyclobenzaprine for the pain - be careful, it may make you sleepy so don't drive or operate machinery until you know how you will react to the medication.

## 2015-07-15 NOTE — Progress Notes (Signed)
S:    Patient arrives in good spirits.  Presents to the clinic for hypertension evaluation.   Patient reports adherence with medications.   Current BP Medications include:  Metoprolol succinate 25 mg daily and amlodipine 5 mg daily.  Antihypertensives tried in the past include: lisinopril (swelling)  Patient reports that he pulled a muscle in his back lifting a hot tub with a friend. It is causing 6/10 pain on his right side.    O:   Last 3 Office BP readings: BP Readings from Last 3 Encounters:  07/15/15 164/88  07/01/15 159/96  06/03/15 146/92    BMET    Component Value Date/Time   NA 131* 04/09/2015 0851   K 4.4 04/09/2015 0851   CL 101 04/09/2015 0851   CO2 19* 04/09/2015 0851   GLUCOSE 103* 04/09/2015 0851   BUN 9 04/09/2015 0851   CREATININE 1.03 04/09/2015 0851   CALCIUM 8.9 04/09/2015 0851   GFRNONAA >60 04/09/2015 0851   GFRAA >60 04/09/2015 0851    A/P: History of hypertension currently UNcontrolled on current medications. Continue metoprolol succinate 25 mg daily and increase amlodipine to 10 mg daily. Though I think that pain is contributing to the increase in blood pressure, due to PMH of STEMI and CAD, patient needs to get a much better controlled blood pressure.   Discussed patient case with Chari Manning, NP, and she asked that I ordered diclofenac gel and cyclobenzaprine. Counseled patient on both medications, including how to use and adverse effects. Also provided basic pain treatment education such as icing the area and resting it. Patient to follow up with [facility] if pain does not improve or if it worsens. Patient verbalized understanding.   Results reviewed and written information provided.  Total time in face-to-face counseling 20 minutes.  F/U Clinic Visit with me in 1 week for blood pressure check.

## 2015-07-19 ENCOUNTER — Encounter (HOSPITAL_COMMUNITY): Payer: Self-pay | Admitting: Emergency Medicine

## 2015-07-19 ENCOUNTER — Emergency Department (HOSPITAL_COMMUNITY)
Admission: EM | Admit: 2015-07-19 | Discharge: 2015-07-19 | Disposition: A | Discharge status: Home / Self Care | Payer: Medicaid Other | Attending: Emergency Medicine | Admitting: Emergency Medicine

## 2015-07-19 DIAGNOSIS — Z7982 Long term (current) use of aspirin: Secondary | ICD-10-CM | POA: Insufficient documentation

## 2015-07-19 DIAGNOSIS — R630 Anorexia: Secondary | ICD-10-CM | POA: Diagnosis not present

## 2015-07-19 DIAGNOSIS — F17210 Nicotine dependence, cigarettes, uncomplicated: Secondary | ICD-10-CM | POA: Diagnosis not present

## 2015-07-19 DIAGNOSIS — R519 Headache, unspecified: Secondary | ICD-10-CM

## 2015-07-19 DIAGNOSIS — H409 Unspecified glaucoma: Secondary | ICD-10-CM | POA: Insufficient documentation

## 2015-07-19 DIAGNOSIS — Z79899 Other long term (current) drug therapy: Secondary | ICD-10-CM | POA: Diagnosis not present

## 2015-07-19 DIAGNOSIS — I252 Old myocardial infarction: Secondary | ICD-10-CM | POA: Insufficient documentation

## 2015-07-19 DIAGNOSIS — Z87828 Personal history of other (healed) physical injury and trauma: Secondary | ICD-10-CM | POA: Diagnosis not present

## 2015-07-19 DIAGNOSIS — I10 Essential (primary) hypertension: Secondary | ICD-10-CM | POA: Diagnosis not present

## 2015-07-19 DIAGNOSIS — K219 Gastro-esophageal reflux disease without esophagitis: Secondary | ICD-10-CM | POA: Diagnosis not present

## 2015-07-19 DIAGNOSIS — Z8673 Personal history of transient ischemic attack (TIA), and cerebral infarction without residual deficits: Secondary | ICD-10-CM | POA: Diagnosis not present

## 2015-07-19 DIAGNOSIS — R51 Headache: Secondary | ICD-10-CM | POA: Diagnosis present

## 2015-07-19 MED ORDER — ACETAMINOPHEN 325 MG PO TABS
650.0000 mg | ORAL_TABLET | Freq: Once | ORAL | Status: AC
  Administered 2015-07-19: 650 mg via ORAL
  Filled 2015-07-19: qty 2

## 2015-07-19 MED ORDER — CYCLOBENZAPRINE HCL 10 MG PO TABS
5.0000 mg | ORAL_TABLET | Freq: Once | ORAL | Status: AC
  Administered 2015-07-19: 5 mg via ORAL
  Filled 2015-07-19: qty 1

## 2015-07-19 MED ORDER — CLONIDINE HCL 0.1 MG PO TABS
0.1000 mg | ORAL_TABLET | Freq: Once | ORAL | Status: AC
  Administered 2015-07-19: 0.1 mg via ORAL
  Filled 2015-07-19: qty 1

## 2015-07-19 MED ORDER — CLONIDINE HCL 0.1 MG PO TABS: 0.1000 mg | ORAL_TABLET | Freq: Two times a day (BID) | ORAL | Status: DC

## 2015-07-19 NOTE — Discharge Instructions (Signed)
Take medicines as prescribed. Follow closely with your primary doctor for further evaluation. Return for headaches different than usual, neurologic symptoms or other new concerns. Minimize salt intake in her diet.  If you were given medicines take as directed.  If you are on coumadin or contraceptives realize their levels and effectiveness is altered by many different medicines.  If you have any reaction (rash, tongues swelling, other) to the medicines stop taking and see a physician.    If your blood pressure was elevated in the ER make sure you follow up for management with a primary doctor or return for chest pain, shortness of breath or stroke symptoms.  Please follow up as directed and return to the ER or see a physician for new or worsening symptoms.  Thank you. Filed Vitals:   07/19/15 0816  BP: 135/97  Pulse: 69  Temp: 97.6 F (36.4 C)  TempSrc: Oral  Resp: 16  Height: 5\' 9"  (1.753 m)  Weight: 158 lb 1.6 oz (71.714 kg)  SpO2: 98%
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
## 2015-07-19 NOTE — ED Notes (Signed)
Pt from home with c/o headaches and hypertension x 1 month.  Pt reports he has been having multiple medication changes per PCP trying to control HTN better.  NAD, ambulatory, A&O.

## 2015-07-23 ENCOUNTER — Ambulatory Visit: Payer: Self-pay | Admitting: Pharmacist

## 2015-07-28 ENCOUNTER — Ambulatory Visit: Payer: Medicaid Other | Attending: Internal Medicine | Admitting: Pharmacist

## 2015-07-28 VITALS — BP 117/82 | HR 93

## 2015-07-28 DIAGNOSIS — Z79899 Other long term (current) drug therapy: Secondary | ICD-10-CM | POA: Diagnosis not present

## 2015-07-28 DIAGNOSIS — I10 Essential (primary) hypertension: Secondary | ICD-10-CM | POA: Diagnosis present

## 2015-07-28 MED ORDER — CLONIDINE HCL 0.1 MG PO TABS: 0.1000 mg | ORAL_TABLET | Freq: Two times a day (BID) | ORAL | Status: DC

## 2015-07-28 NOTE — Patient Instructions (Addendum)
Thanks for coming to see me!  I am sorry that you are still in so much pain.  Continue the clonidine and metoprolol for blood pressure  Make an appointment with Chari Manning for pain

## 2015-07-28 NOTE — Progress Notes (Signed)
Patient ID: Troy Mclaughlin, male   DOB: August 16, 1955, 60 y.o.   MRN: [id number] S:    Patient arrives in good spirits this afternoon. Presents to the clinic for hypertension evaluation.  Patient reports adherence with medications.  Current BP Medications include:  Clonidine 0.1 mg po BID  Antihypertensives tried in the past include: Amlodipine, lisinopril, HCTZ, metoprolol, olmesartan-HCT  Patient reports continued pain from lifting hot tub.    O:   Last 3 Office BP readings: BP Readings from Last 3 Encounters:  07/28/15 117/82  07/19/15 149/105  07/15/15 164/88    BMET    Component Value Date/Time   NA 131* 04/09/2015 0851   K 4.4 04/09/2015 0851   CL 101 04/09/2015 0851   CO2 19* 04/09/2015 0851   GLUCOSE 103* 04/09/2015 0851   BUN 9 04/09/2015 0851   CREATININE 1.03 04/09/2015 0851   CALCIUM 8.9 04/09/2015 0851   GFRNONAA >60 04/09/2015 0851   GFRAA >60 04/09/2015 0851    A/P: Hypertension longstanding currently controlled on current medications. No medication adjustments at this time.  Patient will follow up with Mateo Flow for pain.  Results reviewed and written information provided. Total time in face-to-face counseling 15 minutes.  F/U Clinic Visit with Chari Manning NP. Patient seen with Jamie Brookes Pharm.D Candidate

## 2015-07-29 ENCOUNTER — Ambulatory Visit: Payer: Self-pay | Admitting: Internal Medicine

## 2015-09-08 ENCOUNTER — Emergency Department (HOSPITAL_COMMUNITY): Payer: Medicaid Other

## 2015-09-08 ENCOUNTER — Emergency Department (HOSPITAL_COMMUNITY)
Admission: EM | Admit: 2015-09-08 | Discharge: 2015-09-08 | Disposition: A | Discharge status: Home / Self Care | Payer: Medicaid Other | Attending: Emergency Medicine | Admitting: Emergency Medicine

## 2015-09-08 ENCOUNTER — Encounter (HOSPITAL_COMMUNITY): Payer: Self-pay | Admitting: Family Medicine

## 2015-09-08 DIAGNOSIS — Z8669 Personal history of other diseases of the nervous system and sense organs: Secondary | ICD-10-CM | POA: Insufficient documentation

## 2015-09-08 DIAGNOSIS — Z9889 Other specified postprocedural states: Secondary | ICD-10-CM | POA: Insufficient documentation

## 2015-09-08 DIAGNOSIS — Z8701 Personal history of pneumonia (recurrent): Secondary | ICD-10-CM | POA: Diagnosis not present

## 2015-09-08 DIAGNOSIS — F17210 Nicotine dependence, cigarettes, uncomplicated: Secondary | ICD-10-CM | POA: Insufficient documentation

## 2015-09-08 DIAGNOSIS — I252 Old myocardial infarction: Secondary | ICD-10-CM | POA: Insufficient documentation

## 2015-09-08 DIAGNOSIS — Z87828 Personal history of other (healed) physical injury and trauma: Secondary | ICD-10-CM | POA: Insufficient documentation

## 2015-09-08 DIAGNOSIS — Z8719 Personal history of other diseases of the digestive system: Secondary | ICD-10-CM | POA: Diagnosis not present

## 2015-09-08 DIAGNOSIS — Z79899 Other long term (current) drug therapy: Secondary | ICD-10-CM | POA: Insufficient documentation

## 2015-09-08 DIAGNOSIS — Z7982 Long term (current) use of aspirin: Secondary | ICD-10-CM | POA: Insufficient documentation

## 2015-09-08 DIAGNOSIS — I10 Essential (primary) hypertension: Secondary | ICD-10-CM | POA: Insufficient documentation

## 2015-09-08 DIAGNOSIS — R200 Anesthesia of skin: Secondary | ICD-10-CM | POA: Insufficient documentation

## 2015-09-08 DIAGNOSIS — M79602 Pain in left arm: Secondary | ICD-10-CM | POA: Diagnosis present

## 2015-09-08 DIAGNOSIS — Z8673 Personal history of transient ischemic attack (TIA), and cerebral infarction without residual deficits: Secondary | ICD-10-CM | POA: Insufficient documentation

## 2015-09-08 DIAGNOSIS — M792 Neuralgia and neuritis, unspecified: Secondary | ICD-10-CM | POA: Diagnosis not present

## 2015-09-08 DIAGNOSIS — R51 Headache: Secondary | ICD-10-CM | POA: Diagnosis not present

## 2015-09-08 LAB — COMPREHENSIVE METABOLIC PANEL
ALT: 10 U/L — AB (ref 17–63)
ANION GAP: 8 (ref 5–15)
AST: 13 U/L — ABNORMAL LOW (ref 15–41)
Albumin: 3.5 g/dL (ref 3.5–5.0)
Alkaline Phosphatase: 78 U/L (ref 38–126)
BUN: 8 mg/dL (ref 6–20)
CALCIUM: 8.9 mg/dL (ref 8.9–10.3)
CHLORIDE: 105 mmol/L (ref 101–111)
CO2: 24 mmol/L (ref 22–32)
CREATININE: 1.03 mg/dL (ref 0.61–1.24)
Glucose, Bld: 88 mg/dL (ref 65–99)
Potassium: 3.9 mmol/L (ref 3.5–5.1)
SODIUM: 137 mmol/L (ref 135–145)
TOTAL PROTEIN: 6.7 g/dL (ref 6.5–8.1)
Total Bilirubin: 0.4 mg/dL (ref 0.3–1.2)

## 2015-09-08 LAB — CBC WITH DIFFERENTIAL/PLATELET
BASOS PCT: 0 %
Basophils Absolute: 0 10*3/uL (ref 0.0–0.1)
EOS ABS: 0.2 10*3/uL (ref 0.0–0.7)
EOS PCT: 5 %
HCT: 40.5 % (ref 39.0–52.0)
Hemoglobin: 13.6 g/dL (ref 13.0–17.0)
LYMPHS ABS: 1.8 10*3/uL (ref 0.7–4.0)
Lymphocytes Relative: 41 %
MCH: 23.9 pg — AB (ref 26.0–34.0)
MCHC: 33.6 g/dL (ref 30.0–36.0)
MCV: 71.2 fL — AB (ref 78.0–100.0)
MONO ABS: 0.5 10*3/uL (ref 0.1–1.0)
Monocytes Relative: 10 %
NEUTROS ABS: 2 10*3/uL (ref 1.7–7.7)
Neutrophils Relative %: 44 %
PLATELETS: 246 10*3/uL (ref 150–400)
RBC: 5.69 MIL/uL (ref 4.22–5.81)
RDW: 14.3 % (ref 11.5–15.5)
WBC: 4.5 10*3/uL (ref 4.0–10.5)

## 2015-09-08 LAB — I-STAT TROPONIN, ED: TROPONIN I, POC: 0 ng/mL (ref 0.00–0.08)

## 2015-09-08 MED ORDER — NAPROXEN 500 MG PO TABS: 500.0000 mg | ORAL_TABLET | Freq: Two times a day (BID) | ORAL | Status: DC

## 2015-09-08 NOTE — ED Notes (Signed)
oob to BR with steady gait.

## 2015-09-08 NOTE — Discharge Instructions (Signed)
Follow up with your md next week for recheck °
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
## 2015-09-08 NOTE — ED Notes (Signed)
Pt is in stable condition upon d/c and ambulates from ED. 

## 2015-09-08 NOTE — ED Notes (Signed)
Pt presents from home via POV with c/o intermitent left arm tingling and shooting pain x2 weeks.  [facility] without relief. Pt A&O x4, in NAD.

## 2015-09-08 NOTE — ED Notes (Signed)
Phlebotomy at bedside.

## 2015-09-21 ENCOUNTER — Other Ambulatory Visit: Payer: Self-pay | Admitting: Internal Medicine

## 2015-11-30 ENCOUNTER — Other Ambulatory Visit: Payer: Self-pay | Admitting: Internal Medicine

## 2015-12-02 ENCOUNTER — Telehealth: Payer: Self-pay | Admitting: Internal Medicine

## 2015-12-02 MED ORDER — AMLODIPINE BESYLATE 10 MG PO TABS: 10.0000 mg | ORAL_TABLET | Freq: Every day | ORAL | Status: DC

## 2015-12-02 NOTE — Telephone Encounter (Signed)
Refilled amlodipine - patient needs office visit for refills

## 2015-12-02 NOTE — Telephone Encounter (Signed)
Patient is needing amlodipine.

## 2015-12-08 ENCOUNTER — Emergency Department (HOSPITAL_COMMUNITY)
Admission: EM | Admit: 2015-12-08 | Discharge: 2015-12-08 | Disposition: A | Discharge status: Home / Self Care | Payer: Medicaid Other | Attending: Emergency Medicine | Admitting: Emergency Medicine

## 2015-12-08 ENCOUNTER — Emergency Department (HOSPITAL_COMMUNITY): Payer: Medicaid Other

## 2015-12-08 ENCOUNTER — Encounter (HOSPITAL_COMMUNITY): Payer: Self-pay | Admitting: Emergency Medicine

## 2015-12-08 DIAGNOSIS — Z79899 Other long term (current) drug therapy: Secondary | ICD-10-CM | POA: Diagnosis not present

## 2015-12-08 DIAGNOSIS — Z7982 Long term (current) use of aspirin: Secondary | ICD-10-CM | POA: Insufficient documentation

## 2015-12-08 DIAGNOSIS — Z8673 Personal history of transient ischemic attack (TIA), and cerebral infarction without residual deficits: Secondary | ICD-10-CM | POA: Diagnosis not present

## 2015-12-08 DIAGNOSIS — I252 Old myocardial infarction: Secondary | ICD-10-CM | POA: Insufficient documentation

## 2015-12-08 DIAGNOSIS — F17210 Nicotine dependence, cigarettes, uncomplicated: Secondary | ICD-10-CM | POA: Diagnosis not present

## 2015-12-08 DIAGNOSIS — R079 Chest pain, unspecified: Secondary | ICD-10-CM | POA: Diagnosis present

## 2015-12-08 DIAGNOSIS — I10 Essential (primary) hypertension: Secondary | ICD-10-CM | POA: Diagnosis not present

## 2015-12-08 LAB — CBC
HCT: 41.2 % (ref 39.0–52.0)
HEMOGLOBIN: 13.7 g/dL (ref 13.0–17.0)
MCH: 24 pg — AB (ref 26.0–34.0)
MCHC: 33.3 g/dL (ref 30.0–36.0)
MCV: 72.2 fL — AB (ref 78.0–100.0)
Platelets: 264 10*3/uL (ref 150–400)
RBC: 5.71 MIL/uL (ref 4.22–5.81)
RDW: 14.6 % (ref 11.5–15.5)
WBC: 4.6 10*3/uL (ref 4.0–10.5)

## 2015-12-08 LAB — I-STAT TROPONIN, ED: Troponin i, poc: 0.01 ng/mL (ref 0.00–0.08)

## 2015-12-08 LAB — BASIC METABOLIC PANEL
ANION GAP: 6 (ref 5–15)
BUN: 6 mg/dL (ref 6–20)
CHLORIDE: 105 mmol/L (ref 101–111)
CO2: 26 mmol/L (ref 22–32)
Calcium: 8.9 mg/dL (ref 8.9–10.3)
Creatinine, Ser: 0.92 mg/dL (ref 0.61–1.24)
GFR calc non Af Amer: >60 mL/min (ref >60)
Glucose, Bld: 85 mg/dL (ref 65–99)
Potassium: 3.9 mmol/L (ref 3.5–5.1)
Sodium: 137 mmol/L (ref 135–145)

## 2015-12-08 MED ORDER — GI COCKTAIL ~~LOC~~
30.0000 mL | Freq: Once | ORAL | Status: AC
  Administered 2015-12-08: 30 mL via ORAL
  Filled 2015-12-08: qty 30

## 2015-12-08 MED ORDER — MORPHINE SULFATE (PF) 4 MG/ML IV SOLN
4.0000 mg | Freq: Once | INTRAVENOUS | Status: AC
  Administered 2015-12-08: 4 mg via INTRAVENOUS
  Filled 2015-12-08: qty 1

## 2015-12-08 MED ORDER — ONDANSETRON HCL 4 MG/2ML IJ SOLN
4.0000 mg | Freq: Once | INTRAMUSCULAR | Status: AC
  Administered 2015-12-08: 4 mg via INTRAVENOUS
  Filled 2015-12-08: qty 2
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
## 2015-12-08 NOTE — ED Notes (Signed)
Pt sts mid sternal to right sided CP worse with cough; pt denies SOB

## 2015-12-08 NOTE — Discharge Instructions (Signed)
You were seen in the emergency room today for evaluation of chest pain. Your workup was unremarkable. Your pain improved with medicine in the ED. Please follow up with Dr. Burt Knack as soon as possible as you are overdue for a cardiology follow up. Your blood pressure was also a bit elevated in the ED today. Please follow up with [facility] for further management of your blood pressure. In the meantime continue taking all of your medications as prescribed. Return to the ER for new or worsening symptoms.

## 2015-12-08 NOTE — ED Notes (Signed)
Pt not in room, taken to xray.

## 2015-12-08 NOTE — ED Notes (Signed)
Brought patient back to room, patient undressed, in gown, on monitor, continuous pulse oximetry and blood pressure cuff

## 2015-12-24 ENCOUNTER — Ambulatory Visit: Payer: Self-pay | Admitting: Cardiovascular Disease

## 2016-01-12 ENCOUNTER — Other Ambulatory Visit: Payer: Self-pay | Admitting: Internal Medicine

## 2016-01-13 ENCOUNTER — Emergency Department (HOSPITAL_COMMUNITY): Payer: Medicaid Other

## 2016-01-13 ENCOUNTER — Observation Stay (HOSPITAL_COMMUNITY): Payer: Medicaid Other

## 2016-01-13 ENCOUNTER — Encounter (HOSPITAL_COMMUNITY): Payer: Self-pay | Admitting: Neurology

## 2016-01-13 ENCOUNTER — Inpatient Hospital Stay (HOSPITAL_COMMUNITY)
Admission: EM | Admit: 2016-01-13 | Discharge: 2016-01-16 | DRG: 313 | Disposition: A | Discharge status: Home / Self Care | Payer: Medicaid Other | Attending: Internal Medicine | Admitting: Internal Medicine

## 2016-01-13 DIAGNOSIS — I2510 Atherosclerotic heart disease of native coronary artery without angina pectoris: Secondary | ICD-10-CM | POA: Diagnosis present

## 2016-01-13 DIAGNOSIS — T463X5A Adverse effect of coronary vasodilators, initial encounter: Secondary | ICD-10-CM | POA: Diagnosis present

## 2016-01-13 DIAGNOSIS — R0789 Other chest pain: Principal | ICD-10-CM | POA: Diagnosis present

## 2016-01-13 DIAGNOSIS — Z79899 Other long term (current) drug therapy: Secondary | ICD-10-CM

## 2016-01-13 DIAGNOSIS — I252 Old myocardial infarction: Secondary | ICD-10-CM

## 2016-01-13 DIAGNOSIS — Z8673 Personal history of transient ischemic attack (TIA), and cerebral infarction without residual deficits: Secondary | ICD-10-CM

## 2016-01-13 DIAGNOSIS — R1110 Vomiting, unspecified: Secondary | ICD-10-CM | POA: Diagnosis not present

## 2016-01-13 DIAGNOSIS — G4440 Drug-induced headache, not elsewhere classified, not intractable: Secondary | ICD-10-CM | POA: Diagnosis present

## 2016-01-13 DIAGNOSIS — E860 Dehydration: Secondary | ICD-10-CM | POA: Diagnosis present

## 2016-01-13 DIAGNOSIS — R1012 Left upper quadrant pain: Secondary | ICD-10-CM | POA: Diagnosis not present

## 2016-01-13 DIAGNOSIS — R112 Nausea with vomiting, unspecified: Secondary | ICD-10-CM | POA: Diagnosis present

## 2016-01-13 DIAGNOSIS — Z7982 Long term (current) use of aspirin: Secondary | ICD-10-CM

## 2016-01-13 DIAGNOSIS — R079 Chest pain, unspecified: Secondary | ICD-10-CM | POA: Diagnosis present

## 2016-01-13 DIAGNOSIS — I10 Essential (primary) hypertension: Secondary | ICD-10-CM | POA: Diagnosis present

## 2016-01-13 DIAGNOSIS — D1803 Hemangioma of intra-abdominal structures: Secondary | ICD-10-CM | POA: Diagnosis present

## 2016-01-13 DIAGNOSIS — R109 Unspecified abdominal pain: Secondary | ICD-10-CM | POA: Insufficient documentation

## 2016-01-13 DIAGNOSIS — F17210 Nicotine dependence, cigarettes, uncomplicated: Secondary | ICD-10-CM | POA: Diagnosis present

## 2016-01-13 LAB — I-STAT TROPONIN, ED: Troponin i, poc: 0.01 ng/mL (ref 0.00–0.08)

## 2016-01-13 LAB — HEPATIC FUNCTION PANEL
ALBUMIN: 4.2 g/dL (ref 3.5–5.0)
ALT: 15 U/L — AB (ref 17–63)
AST: 19 U/L (ref 15–41)
Alkaline Phosphatase: 98 U/L (ref 38–126)
TOTAL PROTEIN: 7.6 g/dL (ref 6.5–8.1)
Total Bilirubin: 0.6 mg/dL (ref 0.3–1.2)

## 2016-01-13 LAB — CBC
HEMATOCRIT: 49.5 % (ref 39.0–52.0)
HEMOGLOBIN: 16.6 g/dL (ref 13.0–17.0)
MCH: 24 pg — AB (ref 26.0–34.0)
MCHC: 33.5 g/dL (ref 30.0–36.0)
MCV: 71.6 fL — AB (ref 78.0–100.0)
Platelets: 253 10*3/uL (ref 150–400)
RBC: 6.91 MIL/uL — AB (ref 4.22–5.81)
RDW: 14.5 % (ref 11.5–15.5)
WBC: 4.9 10*3/uL (ref 4.0–10.5)

## 2016-01-13 LAB — URINALYSIS, ROUTINE W REFLEX MICROSCOPIC
Glucose, UA: NEGATIVE mg/dL
Hgb urine dipstick: NEGATIVE
KETONES UR: 40 mg/dL — AB
LEUKOCYTES UA: NEGATIVE
NITRITE: NEGATIVE
PH: 6 (ref 5.0–8.0)
Protein, ur: 30 mg/dL — AB
SPECIFIC GRAVITY, URINE: 1.025 (ref 1.005–1.030)

## 2016-01-13 LAB — URINE MICROSCOPIC-ADD ON
RBC / HPF: NONE SEEN RBC/hpf (ref 0–5)
Squamous Epithelial / LPF: NONE SEEN

## 2016-01-13 LAB — TROPONIN I: Troponin I: <0.03 ng/mL (ref <0.03)

## 2016-01-13 LAB — BASIC METABOLIC PANEL
ANION GAP: 9 (ref 5–15)
BUN: 10 mg/dL (ref 6–20)
CHLORIDE: 102 mmol/L (ref 101–111)
CO2: 27 mmol/L (ref 22–32)
Calcium: 10 mg/dL (ref 8.9–10.3)
Creatinine, Ser: 1.18 mg/dL (ref 0.61–1.24)
GFR calc non Af Amer: >60 mL/min (ref >60)
GLUCOSE: 108 mg/dL — AB (ref 65–99)
Potassium: 4.3 mmol/L (ref 3.5–5.1)
Sodium: 138 mmol/L (ref 135–145)

## 2016-01-13 LAB — LIPASE, BLOOD: LIPASE: 17 U/L (ref 11–51)

## 2016-01-13 MED ORDER — SODIUM CHLORIDE 0.9 % IV BOLUS (SEPSIS)
1000.0000 mL | Freq: Once | INTRAVENOUS | Status: AC
  Administered 2016-01-13: 1000 mL via INTRAVENOUS

## 2016-01-13 MED ORDER — IOPAMIDOL (ISOVUE-300) INJECTION 61%
INTRAVENOUS | Status: AC
  Administered 2016-01-13: 100 mL
  Filled 2016-01-13: qty 100

## 2016-01-13 MED ORDER — MORPHINE SULFATE (PF) 2 MG/ML IV SOLN
2.0000 mg | INTRAVENOUS | Status: DC | PRN
  Administered 2016-01-13 – 2016-01-15 (×11): 2 mg via INTRAVENOUS
  Filled 2016-01-13 (×12): qty 1

## 2016-01-13 MED ORDER — NITROGLYCERIN 0.4 MG/HR TD PT24
0.4000 mg | MEDICATED_PATCH | Freq: Every day | TRANSDERMAL | Status: DC
  Administered 2016-01-14: 0.4 mg via TRANSDERMAL
  Filled 2016-01-13: qty 1

## 2016-01-13 MED ORDER — SODIUM CHLORIDE 0.9 % IV SOLN
INTRAVENOUS | Status: AC
  Administered 2016-01-13: 23:00:00 via INTRAVENOUS

## 2016-01-13 MED ORDER — PROMETHAZINE HCL 25 MG/ML IJ SOLN
12.5000 mg | Freq: Once | INTRAMUSCULAR | Status: AC
  Administered 2016-01-13: 12.5 mg via INTRAVENOUS
  Filled 2016-01-13: qty 1

## 2016-01-13 MED ORDER — ASPIRIN EC 325 MG PO TBEC
325.0000 mg | DELAYED_RELEASE_TABLET | Freq: Once | ORAL | Status: DC
Start: 2016-01-13 — End: 2016-01-13

## 2016-01-13 MED ORDER — ATORVASTATIN CALCIUM 40 MG PO TABS
40.0000 mg | ORAL_TABLET | Freq: Every day | ORAL | Status: DC
  Administered 2016-01-14 – 2016-01-15 (×2): 40 mg via ORAL
  Filled 2016-01-13 (×2): qty 1

## 2016-01-13 MED ORDER — ENOXAPARIN SODIUM 40 MG/0.4ML ~~LOC~~ SOLN
40.0000 mg | Freq: Every day | SUBCUTANEOUS | Status: DC
  Administered 2016-01-14 – 2016-01-15 (×2): 40 mg via SUBCUTANEOUS
  Filled 2016-01-13 (×2): qty 0.4

## 2016-01-13 MED ORDER — NITROGLYCERIN 0.4 MG SL SUBL
0.4000 mg | SUBLINGUAL_TABLET | SUBLINGUAL | Status: DC | PRN
  Administered 2016-01-13 (×3): 0.4 mg via SUBLINGUAL

## 2016-01-13 MED ORDER — ACETAMINOPHEN 325 MG PO TABS
650.0000 mg | ORAL_TABLET | Freq: Once | ORAL | Status: AC
  Administered 2016-01-13: 650 mg via ORAL
  Filled 2016-01-13: qty 2

## 2016-01-13 MED ORDER — ASPIRIN EC 325 MG PO TBEC
325.0000 mg | DELAYED_RELEASE_TABLET | Freq: Every day | ORAL | Status: DC
  Administered 2016-01-14 – 2016-01-16 (×3): 325 mg via ORAL
  Filled 2016-01-13 (×3): qty 1

## 2016-01-13 MED ORDER — ACETAMINOPHEN 325 MG PO TABS
650.0000 mg | ORAL_TABLET | ORAL | Status: DC | PRN
  Filled 2016-01-13: qty 2

## 2016-01-13 MED ORDER — ASPIRIN 81 MG PO CHEW
CHEWABLE_TABLET | ORAL | Status: AC
  Filled 2016-01-13: qty 4

## 2016-01-13 MED ORDER — AMLODIPINE BESYLATE 10 MG PO TABS
10.0000 mg | ORAL_TABLET | Freq: Every day | ORAL | Status: DC
  Administered 2016-01-14 – 2016-01-16 (×3): 10 mg via ORAL
  Filled 2016-01-13 (×3): qty 1

## 2016-01-13 MED ORDER — ASPIRIN 81 MG PO CHEW
324.0000 mg | CHEWABLE_TABLET | Freq: Once | ORAL | Status: AC
  Administered 2016-01-13: 324 mg via ORAL

## 2016-01-13 MED ORDER — ONDANSETRON HCL 4 MG/2ML IJ SOLN
4.0000 mg | Freq: Once | INTRAMUSCULAR | Status: AC
  Administered 2016-01-13: 4 mg via INTRAVENOUS
  Filled 2016-01-13: qty 2

## 2016-01-13 MED ORDER — HYDROMORPHONE HCL 1 MG/ML IJ SOLN
0.5000 mg | Freq: Once | INTRAMUSCULAR | Status: AC
  Administered 2016-01-13: 0.5 mg via INTRAVENOUS
  Filled 2016-01-13: qty 1

## 2016-01-13 MED ORDER — ONDANSETRON 4 MG PO TBDP
8.0000 mg | ORAL_TABLET | Freq: Once | ORAL | Status: AC
  Administered 2016-01-13: 8 mg via ORAL
  Filled 2016-01-13: qty 2

## 2016-01-13 MED ORDER — HYDRALAZINE HCL 20 MG/ML IJ SOLN
10.0000 mg | INTRAMUSCULAR | Status: DC | PRN
  Administered 2016-01-14: 10 mg via INTRAVENOUS
  Filled 2016-01-13: qty 1

## 2016-01-13 MED ORDER — ONDANSETRON HCL 4 MG/2ML IJ SOLN
4.0000 mg | Freq: Four times a day (QID) | INTRAMUSCULAR | Status: DC | PRN
  Administered 2016-01-14 – 2016-01-16 (×7): 4 mg via INTRAVENOUS
  Filled 2016-01-13 (×8): qty 2
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
## 2016-01-13 NOTE — ED Notes (Signed)
EDP at bedside  

## 2016-01-13 NOTE — Progress Notes (Signed)
Patient arrived from ED to [phone]. Currently in bed complaining of nausea and abdominal pain. Admitting MD Hal Hope is aware of the patient being on the floor. Call light within reach. Will continue to monitor for orders by MD.

## 2016-01-13 NOTE — ED Notes (Signed)
Patient states pain feels like it starts in abdomen and goes up to left chest.

## 2016-01-13 NOTE — ED Triage Notes (Signed)
Pt here with left sided CP x 3 days, denies n/v but feels SOB. Also feels dizzy. Has hx of MI and he thinks this feels same. Is a x 4. Skin warm and dry.
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
## 2016-01-14 ENCOUNTER — Other Ambulatory Visit (HOSPITAL_COMMUNITY): Payer: Self-pay

## 2016-01-14 DIAGNOSIS — I252 Old myocardial infarction: Secondary | ICD-10-CM | POA: Diagnosis not present

## 2016-01-14 DIAGNOSIS — R112 Nausea with vomiting, unspecified: Secondary | ICD-10-CM | POA: Diagnosis not present

## 2016-01-14 DIAGNOSIS — F17210 Nicotine dependence, cigarettes, uncomplicated: Secondary | ICD-10-CM | POA: Diagnosis present

## 2016-01-14 DIAGNOSIS — G4440 Drug-induced headache, not elsewhere classified, not intractable: Secondary | ICD-10-CM | POA: Diagnosis present

## 2016-01-14 DIAGNOSIS — R1012 Left upper quadrant pain: Secondary | ICD-10-CM

## 2016-01-14 DIAGNOSIS — I10 Essential (primary) hypertension: Secondary | ICD-10-CM

## 2016-01-14 DIAGNOSIS — I2510 Atherosclerotic heart disease of native coronary artery without angina pectoris: Secondary | ICD-10-CM | POA: Diagnosis present

## 2016-01-14 DIAGNOSIS — R0789 Other chest pain: Secondary | ICD-10-CM | POA: Diagnosis present

## 2016-01-14 DIAGNOSIS — T463X5A Adverse effect of coronary vasodilators, initial encounter: Secondary | ICD-10-CM | POA: Diagnosis present

## 2016-01-14 DIAGNOSIS — E860 Dehydration: Secondary | ICD-10-CM | POA: Diagnosis present

## 2016-01-14 DIAGNOSIS — R079 Chest pain, unspecified: Secondary | ICD-10-CM

## 2016-01-14 DIAGNOSIS — Z8673 Personal history of transient ischemic attack (TIA), and cerebral infarction without residual deficits: Secondary | ICD-10-CM | POA: Diagnosis not present

## 2016-01-14 DIAGNOSIS — Z79899 Other long term (current) drug therapy: Secondary | ICD-10-CM | POA: Diagnosis not present

## 2016-01-14 DIAGNOSIS — R071 Chest pain on breathing: Secondary | ICD-10-CM

## 2016-01-14 DIAGNOSIS — Z7982 Long term (current) use of aspirin: Secondary | ICD-10-CM | POA: Diagnosis not present

## 2016-01-14 LAB — TROPONIN I

## 2016-01-14 LAB — RAPID URINE DRUG SCREEN, HOSP PERFORMED
Amphetamines: NOT DETECTED
BARBITURATES: NOT DETECTED
Benzodiazepines: NOT DETECTED
Cocaine: NOT DETECTED
Opiates: NOT DETECTED
TETRAHYDROCANNABINOL: NOT DETECTED

## 2016-01-14 MED ORDER — METOCLOPRAMIDE HCL 5 MG/ML IJ SOLN
5.0000 mg | Freq: Three times a day (TID) | INTRAMUSCULAR | Status: AC
Start: 2016-01-14 — End: 2016-01-14
  Administered 2016-01-14 (×2): 5 mg via INTRAVENOUS
  Filled 2016-01-14 (×2): qty 2

## 2016-01-14 MED ORDER — METOPROLOL TARTRATE 25 MG PO TABS
25.0000 mg | ORAL_TABLET | Freq: Two times a day (BID) | ORAL | Status: DC
  Administered 2016-01-14: 25 mg via ORAL
  Filled 2016-01-14: qty 1

## 2016-01-14 MED ORDER — SODIUM CHLORIDE 0.9 % IV SOLN
INTRAVENOUS | Status: AC
  Administered 2016-01-14 – 2016-01-15 (×2): via INTRAVENOUS

## 2016-01-14 MED ORDER — FAMOTIDINE IN NACL 20-0.9 MG/50ML-% IV SOLN
20.0000 mg | Freq: Once | INTRAVENOUS | Status: AC
  Administered 2016-01-14: 20 mg via INTRAVENOUS
  Filled 2016-01-14: qty 50

## 2016-01-14 MED ORDER — CARVEDILOL 12.5 MG PO TABS
12.5000 mg | ORAL_TABLET | Freq: Two times a day (BID) | ORAL | Status: DC
  Administered 2016-01-14 – 2016-01-16 (×4): 12.5 mg via ORAL
  Filled 2016-01-14 (×4): qty 1
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
## 2016-01-15 ENCOUNTER — Other Ambulatory Visit (HOSPITAL_COMMUNITY): Payer: Self-pay

## 2016-01-15 LAB — BASIC METABOLIC PANEL
Anion gap: 8 (ref 5–15)
BUN: 13 mg/dL (ref 6–20)
CHLORIDE: 102 mmol/L (ref 101–111)
CO2: 23 mmol/L (ref 22–32)
Calcium: 9.1 mg/dL (ref 8.9–10.3)
Creatinine, Ser: 1.22 mg/dL (ref 0.61–1.24)
GFR calc Af Amer: >60 mL/min (ref >60)
GLUCOSE: 85 mg/dL (ref 65–99)
POTASSIUM: 4.2 mmol/L (ref 3.5–5.1)
Sodium: 133 mmol/L — ABNORMAL LOW (ref 135–145)

## 2016-01-15 LAB — MAGNESIUM: Magnesium: 1.9 mg/dL (ref 1.7–2.4)

## 2016-01-15 MED ORDER — FAMOTIDINE 20 MG PO TABS
20.0000 mg | ORAL_TABLET | Freq: Two times a day (BID) | ORAL | Status: DC
  Administered 2016-01-15 – 2016-01-16 (×3): 20 mg via ORAL
  Filled 2016-01-15 (×3): qty 1

## 2016-01-15 MED ORDER — SODIUM CHLORIDE 0.9 % IV SOLN: INTRAVENOUS | Status: DC

## 2016-01-15 MED ORDER — METOCLOPRAMIDE HCL 5 MG/ML IJ SOLN
5.0000 mg | Freq: Three times a day (TID) | INTRAMUSCULAR | Status: AC
  Administered 2016-01-15: 5 mg via INTRAVENOUS
  Filled 2016-01-15: qty 2

## 2016-01-15 MED ORDER — MORPHINE SULFATE (PF) 2 MG/ML IV SOLN
1.0000 mg | INTRAVENOUS | Status: DC | PRN
  Administered 2016-01-15 – 2016-01-16 (×5): 1 mg via INTRAVENOUS
  Filled 2016-01-15 (×5): qty 1

## 2016-01-15 MED ORDER — GI COCKTAIL ~~LOC~~
30.0000 mL | Freq: Once | ORAL | Status: AC
  Administered 2016-01-15: 30 mL via ORAL
  Filled 2016-01-15: qty 30
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
## 2016-01-16 ENCOUNTER — Inpatient Hospital Stay (HOSPITAL_COMMUNITY): Payer: Medicaid Other

## 2016-01-16 DIAGNOSIS — R079 Chest pain, unspecified: Secondary | ICD-10-CM

## 2016-01-16 DIAGNOSIS — R0789 Other chest pain: Principal | ICD-10-CM

## 2016-01-16 LAB — ECHOCARDIOGRAM COMPLETE
CHL CUP RV SYS PRESS: 21 mmHg
E decel time: 248 msec
EERAT: 6.96
FS: 35 % (ref 28–44)
HEIGHTINCHES: 69 in
IVS/LV PW RATIO, ED: 0.84
LA ID, A-P, ES: 33 mm
LA diam end sys: 33 mm
LA vol index: 18.9 mL/m2
LA vol: 35.6 mL
LADIAMINDEX: 1.76 cm/m2
LAVOLA4C: 33.7 mL
LDCA: 3.8 cm2
LV E/e' medial: 6.96
LV E/e'average: 6.96
LV TDI E'LATERAL: 9.9
LVELAT: 9.9 cm/s
LVOTD: 22 mm
MV Dec: 248
MV pk A vel: 61.1 m/s
MVPKEVEL: 68.9 m/s
PV Reg vel dias: 105 cm/s
PW: 11.1 mm — AB (ref 0.6–1.1)
RV LATERAL S' VELOCITY: 12.7 cm/s
Reg peak vel: 214 cm/s
TAPSE: 21.7 mm
TDI e' medial: 7.72
TRMAXVEL: 214 cm/s
WEIGHTICAEL: 2563.2 [oz_av]

## 2016-01-16 LAB — CBC
HEMATOCRIT: 38.6 % — AB (ref 39.0–52.0)
HEMOGLOBIN: 12.9 g/dL — AB (ref 13.0–17.0)
MCH: 23.9 pg — AB (ref 26.0–34.0)
MCHC: 33.4 g/dL (ref 30.0–36.0)
MCV: 71.5 fL — AB (ref 78.0–100.0)
Platelets: 204 10*3/uL (ref 150–400)
RBC: 5.4 MIL/uL (ref 4.22–5.81)
RDW: 14.4 % (ref 11.5–15.5)
WBC: 4.5 10*3/uL (ref 4.0–10.5)

## 2016-01-16 LAB — BASIC METABOLIC PANEL
Anion gap: 7 (ref 5–15)
BUN: 9 mg/dL (ref 6–20)
CHLORIDE: 104 mmol/L (ref 101–111)
CO2: 25 mmol/L (ref 22–32)
Calcium: 8.8 mg/dL — ABNORMAL LOW (ref 8.9–10.3)
Creatinine, Ser: 1.03 mg/dL (ref 0.61–1.24)
GFR calc Af Amer: >60 mL/min (ref >60)
GLUCOSE: 81 mg/dL (ref 65–99)
POTASSIUM: 3.5 mmol/L (ref 3.5–5.1)
Sodium: 136 mmol/L (ref 135–145)

## 2016-01-16 LAB — MAGNESIUM: Magnesium: 1.7 mg/dL (ref 1.7–2.4)

## 2016-01-16 MED ORDER — CARVEDILOL 3.125 MG PO TABS: 3.1250 mg | ORAL_TABLET | Freq: Two times a day (BID) | ORAL | 0 refills | Status: DC

## 2016-01-16 MED ORDER — NITROGLYCERIN 0.4 MG SL SUBL: 0.4000 mg | SUBLINGUAL_TABLET | SUBLINGUAL | 0 refills | Status: DC | PRN

## 2016-01-16 MED ORDER — POTASSIUM CHLORIDE CRYS ER 20 MEQ PO TBCR
40.0000 meq | EXTENDED_RELEASE_TABLET | Freq: Once | ORAL | Status: AC
  Administered 2016-01-16: 40 meq via ORAL
  Filled 2016-01-16: qty 2

## 2016-01-16 MED ORDER — ATORVASTATIN CALCIUM 40 MG PO TABS: 40.0000 mg | ORAL_TABLET | Freq: Every day | ORAL | 0 refills | Status: DC

## 2016-01-16 MED ORDER — FAMOTIDINE 20 MG PO TABS: 20.0000 mg | ORAL_TABLET | Freq: Two times a day (BID) | ORAL | 0 refills | Status: DC

## 2016-01-16 NOTE — Progress Notes (Signed)
  Echocardiogram 2D Echocardiogram has been performed.  Troy Mclaughlin 01/16/2016, 8:47 AM
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
## 2016-02-11 ENCOUNTER — Encounter: Payer: Self-pay | Admitting: Physician Assistant

## 2016-02-12 ENCOUNTER — Ambulatory Visit (INDEPENDENT_AMBULATORY_CARE_PROVIDER_SITE_OTHER): Payer: Medicaid Other | Admitting: Physician Assistant

## 2016-02-12 ENCOUNTER — Encounter: Payer: Self-pay | Admitting: Physician Assistant

## 2016-02-12 VITALS — BP 148/100 | HR 73 | Ht 69.0 in | Wt 166.0 lb

## 2016-02-12 DIAGNOSIS — R1084 Generalized abdominal pain: Secondary | ICD-10-CM

## 2016-02-12 DIAGNOSIS — R079 Chest pain, unspecified: Secondary | ICD-10-CM

## 2016-02-12 DIAGNOSIS — I10 Essential (primary) hypertension: Secondary | ICD-10-CM

## 2016-02-12 DIAGNOSIS — Z720 Tobacco use: Secondary | ICD-10-CM

## 2016-02-12 DIAGNOSIS — E785 Hyperlipidemia, unspecified: Secondary | ICD-10-CM | POA: Diagnosis not present

## 2016-02-12 DIAGNOSIS — I2510 Atherosclerotic heart disease of native coronary artery without angina pectoris: Secondary | ICD-10-CM

## 2016-02-12 MED ORDER — CARVEDILOL 6.25 MG PO TABS: 6.1250 mg | ORAL_TABLET | Freq: Two times a day (BID) | ORAL | 1 refills | Status: DC

## 2016-02-12 NOTE — Patient Instructions (Addendum)
Medication Instructions:  Your physician has recommended you make the following change in your medication:  1.  INCREASE the Coreg to 6.25 taking 1 tablet twice a day.   Labwork: None ordered  Testing/Procedures: Your physician has requested that you have en exercise stress myoview. For further information please visit [email or web address]. Please follow instruction sheet, as given.   Follow-Up: Your physician recommends that you schedule a follow-up appointment in: 3 MONTHS WITH DR. Burt Knack    Any Other Special Instructions Will Be Listed Below (If Applicable). YOU NEED TO FOLLOW-UP WITH YOUR PRIMARY CARE PHYSICIAN  If you need a refill on your cardiac medications before your next appointment, please call your pharmacy.

## 2016-02-26 ENCOUNTER — Other Ambulatory Visit: Payer: Self-pay | Admitting: Pharmacist

## 2016-02-26 MED ORDER — AMLODIPINE BESYLATE 10 MG PO TABS: ORAL_TABLET | ORAL | 0 refills | Status: DC

## 2016-03-01 ENCOUNTER — Telehealth (HOSPITAL_COMMUNITY): Payer: Self-pay | Admitting: *Deleted

## 2016-03-01 NOTE — Telephone Encounter (Signed)
Patient given detailed instructions per Myocardial Perfusion Study Information Sheet for the test on  03/04/16. Patient notified to arrive 15 minutes early and that it is imperative to arrive on time for appointment to keep from having the test rescheduled.  If you need to cancel or reschedule your appointment, please call the office within 24 hours of your appointment. Failure to do so may result in a cancellation of your appointment, and a $50 no show fee. Patient verbalized understanding. Kirstie Peri

## 2016-03-04 ENCOUNTER — Encounter (HOSPITAL_COMMUNITY): Payer: Self-pay

## 2016-03-15 ENCOUNTER — Ambulatory Visit: Payer: Medicaid Other | Attending: Internal Medicine | Admitting: Internal Medicine

## 2016-03-15 ENCOUNTER — Encounter: Payer: Self-pay | Admitting: Internal Medicine

## 2016-03-15 VITALS — BP 150/85 | HR 92 | Temp 98.1°F | Resp 16 | Wt 171.4 lb

## 2016-03-15 DIAGNOSIS — I25118 Atherosclerotic heart disease of native coronary artery with other forms of angina pectoris: Secondary | ICD-10-CM | POA: Diagnosis not present

## 2016-03-15 DIAGNOSIS — I10 Essential (primary) hypertension: Secondary | ICD-10-CM | POA: Insufficient documentation

## 2016-03-15 DIAGNOSIS — Z8673 Personal history of transient ischemic attack (TIA), and cerebral infarction without residual deficits: Secondary | ICD-10-CM | POA: Diagnosis not present

## 2016-03-15 DIAGNOSIS — F17210 Nicotine dependence, cigarettes, uncomplicated: Secondary | ICD-10-CM | POA: Diagnosis not present

## 2016-03-15 DIAGNOSIS — Z933 Colostomy status: Secondary | ICD-10-CM | POA: Diagnosis not present

## 2016-03-15 DIAGNOSIS — Z1289 Encounter for screening for malignant neoplasm of other sites: Secondary | ICD-10-CM | POA: Insufficient documentation

## 2016-03-15 DIAGNOSIS — I2510 Atherosclerotic heart disease of native coronary artery without angina pectoris: Secondary | ICD-10-CM | POA: Diagnosis not present

## 2016-03-15 DIAGNOSIS — Z125 Encounter for screening for malignant neoplasm of prostate: Secondary | ICD-10-CM | POA: Diagnosis not present

## 2016-03-15 DIAGNOSIS — Z720 Tobacco use: Secondary | ICD-10-CM

## 2016-03-15 DIAGNOSIS — Z1211 Encounter for screening for malignant neoplasm of colon: Secondary | ICD-10-CM

## 2016-03-15 DIAGNOSIS — Z23 Encounter for immunization: Secondary | ICD-10-CM | POA: Insufficient documentation

## 2016-03-15 DIAGNOSIS — D649 Anemia, unspecified: Secondary | ICD-10-CM | POA: Insufficient documentation

## 2016-03-15 DIAGNOSIS — E291 Testicular hypofunction: Secondary | ICD-10-CM

## 2016-03-15 DIAGNOSIS — Z1159 Encounter for screening for other viral diseases: Secondary | ICD-10-CM

## 2016-03-15 DIAGNOSIS — R300 Dysuria: Secondary | ICD-10-CM | POA: Diagnosis not present

## 2016-03-15 DIAGNOSIS — R7303 Prediabetes: Secondary | ICD-10-CM

## 2016-03-15 LAB — CBC WITH DIFFERENTIAL/PLATELET
BASOS PCT: 0 %
Basophils Absolute: 0 cells/uL (ref 0–200)
EOS ABS: 141 {cells}/uL (ref 15–500)
Eosinophils Relative: 3 %
HEMATOCRIT: 42.1 % (ref 38.5–50.0)
Hemoglobin: 13.6 g/dL (ref 13.2–17.1)
Lymphocytes Relative: 36 %
Lymphs Abs: 1692 cells/uL (ref 850–3900)
MCH: 23.5 pg — ABNORMAL LOW (ref 27.0–33.0)
MCHC: 32.3 g/dL (ref 32.0–36.0)
MCV: 72.7 fL — AB (ref 80.0–100.0)
MONO ABS: 517 {cells}/uL (ref 200–950)
MONOS PCT: 11 %
MPV: 10.4 fL (ref 7.5–12.5)
NEUTROS ABS: 2350 {cells}/uL (ref 1500–7800)
Neutrophils Relative %: 50 %
PLATELETS: 276 10*3/uL (ref 140–400)
RBC: 5.79 MIL/uL (ref 4.20–5.80)
RDW: 16.7 % — ABNORMAL HIGH (ref 11.0–15.0)
WBC: 4.7 10*3/uL (ref 3.8–10.8)

## 2016-03-15 LAB — POCT URINALYSIS DIPSTICK
BILIRUBIN UA: NEGATIVE
GLUCOSE UA: NEGATIVE
Ketones, UA: NEGATIVE
Leukocytes, UA: NEGATIVE
NITRITE UA: NEGATIVE
PH UA: 7
Protein, UA: NEGATIVE
RBC UA: NEGATIVE
SPEC GRAV UA: 1.01
Urobilinogen, UA: 0.2

## 2016-03-15 LAB — POCT GLYCOSYLATED HEMOGLOBIN (HGB A1C): HEMOGLOBIN A1C: 6.4

## 2016-03-15 LAB — PSA: PSA: 3.4 ng/mL (ref <=4.0)

## 2016-03-15 MED ORDER — NITROGLYCERIN 0.4 MG SL SUBL: 0.4000 mg | SUBLINGUAL_TABLET | SUBLINGUAL | 11 refills | Status: DC | PRN

## 2016-03-15 MED ORDER — ASPIRIN 81 MG PO TBEC: 81.0000 mg | DELAYED_RELEASE_TABLET | Freq: Every day | ORAL | 5 refills | Status: DC

## 2016-03-15 MED ORDER — METFORMIN HCL ER 500 MG PO TB24: 500.0000 mg | ORAL_TABLET | Freq: Every day | ORAL | 3 refills | Status: DC

## 2016-03-15 MED ORDER — AMLODIPINE BESYLATE 10 MG PO TABS: ORAL_TABLET | ORAL | 3 refills | Status: DC

## 2016-03-15 MED ORDER — NICOTINE 14 MG/24HR TD PT24: 14.0000 mg | MEDICATED_PATCH | Freq: Every day | TRANSDERMAL | 0 refills | Status: DC

## 2016-03-15 MED ORDER — CARVEDILOL 6.25 MG PO TABS: 6.1250 mg | ORAL_TABLET | Freq: Two times a day (BID) | ORAL | 3 refills | Status: DC

## 2016-03-15 MED ORDER — ATORVASTATIN CALCIUM 40 MG PO TABS: 40.0000 mg | ORAL_TABLET | Freq: Every day | ORAL | 3 refills | Status: DC

## 2016-03-15 MED ORDER — FAMOTIDINE 20 MG PO TABS: 20.0000 mg | ORAL_TABLET | Freq: Two times a day (BID) | ORAL | 3 refills | Status: DC

## 2016-03-15 MED FILL — SM NICOTINE 14 MG/24HR PATC: 14 | 28 days supply | Qty: 28 | Fill #0

## 2016-03-15 NOTE — Progress Notes (Signed)
Pt is in the office today for establish care Pt states he is not in any pain Pt states he hasn't been taking his bp medication because he didn't have refills on it
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
## 2016-03-15 NOTE — Patient Instructions (Addendum)
1800quitsmoking.    Influenza Virus Vaccine injection (Fluarix) What is this medicine? INFLUENZA VIRUS VACCINE (in floo EN zuh VAHY ruhs vak SEEN) helps to reduce the risk of getting influenza also known as the flu. This medicine may be used for other purposes; ask your health care provider or pharmacist if you have questions. What should I tell my health care provider before I take this medicine? They need to know if you have any of these conditions: -bleeding disorder like hemophilia -fever or infection -Guillain-Barre syndrome or other neurological problems -immune system problems -infection with the human immunodeficiency virus (HIV) or AIDS -low blood platelet counts -multiple sclerosis -an unusual or allergic reaction to influenza virus vaccine, eggs, chicken proteins, latex, gentamicin, other medicines, foods, dyes or preservatives -pregnant or trying to get pregnant -breast-feeding How should I use this medicine? This vaccine is for injection into a muscle. It is given by a health care professional. A copy of Vaccine Information Statements will be given before each vaccination. Read this sheet carefully each time. The sheet may change frequently. Talk to your pediatrician regarding the use of this medicine in children. Special care may be needed. Overdosage: If you think you have taken too much of this medicine contact a poison control center or emergency room at once. NOTE: This medicine is only for you. Do not share this medicine with others. What if I miss a dose? This does not apply. What may interact with this medicine? -chemotherapy or radiation therapy -medicines that lower your immune system like etanercept, anakinra, infliximab, and adalimumab -medicines that treat or prevent blood clots like warfarin -phenytoin -steroid medicines like prednisone or cortisone -theophylline -vaccines This list may not describe all possible interactions. Give your health care  provider a list of all the medicines, herbs, non-prescription drugs, or dietary supplements you use. Also tell them if you smoke, drink alcohol, or use illegal drugs. Some items may interact with your medicine. What should I watch for while using this medicine? Report any side effects that do not go away within 3 days to your doctor or health care professional. Call your health care provider if any unusual symptoms occur within 6 weeks of receiving this vaccine. You may still catch the flu, but the illness is not usually as bad. You cannot get the flu from the vaccine. The vaccine will not protect against colds or other illnesses that may cause fever. The vaccine is needed every year. What side effects may I notice from receiving this medicine? Side effects that you should report to your doctor or health care professional as soon as possible: -allergic reactions like skin rash, itching or hives, swelling of the face, lips, or tongue Side effects that usually do not require medical attention (report to your doctor or health care professional if they continue or are bothersome): -fever -headache -muscle aches and pains -pain, tenderness, redness, or swelling at site where injected -weak or tired This list may not describe all possible side effects. Call your doctor for medical advice about side effects. You may report side effects to FDA at 1-800-FDA-1088. Where should I keep my medicine? This vaccine is only given in a clinic, pharmacy, doctor's office, or other health care setting and will not be stored at home. NOTE: This sheet is a summary. It may not cover all possible information. If you have questions about this medicine, talk to your doctor, pharmacist, or health care provider.    2016, Elsevier/Gold Standard. (2007-12-05 09:30:40) Tdap Vaccine (Tetanus, Diphtheria  and Pertussis): What You Need to Know 1. Why get vaccinated? Tetanus, diphtheria and pertussis are very serious diseases.  Tdap vaccine can protect [place] from these diseases. And, Tdap vaccine given to pregnant women can protect newborn babies against pertussis. TETANUS (Lockjaw) is rare in the [place] today. It causes painful muscle tightening and stiffness, usually all over the body.  It can lead to tightening of muscles in the head and neck so you can't open your mouth, swallow, or sometimes even breathe. Tetanus kills about 1 out of 10 people who are infected even after receiving the best medical care. DIPHTHERIA is also rare in the [place] today. It can cause a thick coating to form in the back of the throat.  It can lead to breathing problems, heart failure, paralysis, and death. PERTUSSIS (Whooping Cough) causes severe coughing spells, which can cause difficulty breathing, vomiting and disturbed sleep.  It can also lead to weight loss, incontinence, and rib fractures. Up to 2 in 100 adolescents and 5 in 100 adults with pertussis are hospitalized or have complications, which could include pneumonia or death. These diseases are caused by bacteria. Diphtheria and pertussis are spread from person to person through secretions from coughing or sneezing. Tetanus enters the body through cuts, scratches, or wounds. Before vaccines, as many as 200,000 cases of diphtheria, 200,000 cases of pertussis, and hundreds of cases of tetanus, were reported in the [place] each year. Since vaccination began, reports of cases for tetanus and diphtheria have dropped by about 99% and for pertussis by about 80%. 2. Tdap vaccine Tdap vaccine can protect adolescents and adults from tetanus, diphtheria, and pertussis. One dose of Tdap is routinely given at age 35 or 34. People who did not get Tdap at that age should get it as soon as possible. Tdap is especially important for healthcare professionals and anyone having close contact with a baby younger than 12 months. Pregnant women should get a dose of Tdap during every  pregnancy, to protect the newborn from pertussis. Infants are most at risk for severe, life-threatening complications from pertussis. Another vaccine, called Td, protects against tetanus and diphtheria, but not pertussis. A Td booster should be given every 10 years. Tdap may be given as one of these boosters if you have never gotten Tdap before. Tdap may also be given after a severe cut or burn to prevent tetanus infection. Your doctor or the person giving you the vaccine can give you more information. Tdap may safely be given at the same time as other vaccines. 3. Some people should not get this vaccine  A person who has ever had a life-threatening allergic reaction after a previous dose of any diphtheria, tetanus or pertussis containing vaccine, OR has a severe allergy to any part of this vaccine, should not get Tdap vaccine. Tell the person giving the vaccine about any severe allergies.  Anyone who had coma or long repeated seizures within 7 days after a childhood dose of DTP or DTaP, or a previous dose of Tdap, should not get Tdap, unless a cause other than the vaccine was found. They can still get Td.  Talk to your doctor if you:  have seizures or another nervous system problem,  had severe pain or swelling after any vaccine containing diphtheria, tetanus or pertussis,  ever had a condition called Guillain-Barr Syndrome (GBS),  aren't feeling well on the day the shot is scheduled. 4. Risks With any medicine, including vaccines, there is a chance of  side effects. These are usually mild and go away on their own. Serious reactions are also possible but are rare. Most people who get Tdap vaccine do not have any problems with it. Mild problems following Tdap (Did not interfere with activities)  Pain where the shot was given (about 3 in 4 adolescents or 2 in 3 adults)  Redness or swelling where the shot was given (about 1 person in 5)  Mild fever of at least 100.66F (up to about 1 in 25  adolescents or 1 in 100 adults)  Headache (about 3 or 4 people in 10)  Tiredness (about 1 person in 3 or 4)  Nausea, vomiting, diarrhea, stomach ache (up to 1 in 4 adolescents or 1 in 10 adults)  Chills, sore joints (about 1 person in 10)  Body aches (about 1 person in 3 or 4)  Rash, swollen glands (uncommon) Moderate problems following Tdap (Interfered with activities, but did not require medical attention)  Pain where the shot was given (up to 1 in 5 or 6)  Redness or swelling where the shot was given (up to about 1 in 16 adolescents or 1 in 12 adults)  Fever over 102F (about 1 in 100 adolescents or 1 in 250 adults)  Headache (about 1 in 7 adolescents or 1 in 10 adults)  Nausea, vomiting, diarrhea, stomach ache (up to 1 or 3 people in 100)  Swelling of the entire arm where the shot was given (up to about 1 in 500). Severe problems following Tdap (Unable to perform usual activities; required medical attention)  Swelling, severe pain, bleeding and redness in the arm where the shot was given (rare). Problems that could happen after any vaccine:  People sometimes faint after a medical procedure, including vaccination. Sitting or lying down for about 15 minutes can help prevent fainting, and injuries caused by a fall. Tell your doctor if you feel dizzy, or have vision changes or ringing in the ears.  Some people get severe pain in the shoulder and have difficulty moving the arm where a shot was given. This happens very rarely.  Any medication can cause a severe allergic reaction. Such reactions from a vaccine are very rare, estimated at fewer than 1 in a million doses, and would happen within a few minutes to a few hours after the vaccination. As with any medicine, there is a very remote chance of a vaccine causing a serious injury or death. The safety of vaccines is always being monitored. For more information, visit: http://www.aguilar.org/ 5. What if there is a serious  problem? What should I look for?  Look for anything that concerns you, such as signs of a severe allergic reaction, very high fever, or unusual behavior.  Signs of a severe allergic reaction can include hives, swelling of the face and throat, difficulty breathing, a fast heartbeat, dizziness, and weakness. These would usually start a few minutes to a few hours after the vaccination. What should I do?  If you think it is a severe allergic reaction or other emergency that can't wait, call 9-1-1 or get the person to the nearest hospital. Otherwise, call your doctor.  Afterward, the reaction should be reported to the Vaccine Adverse Event Reporting System (VAERS). Your doctor might file this report, or you can do it yourself through the VAERS web site at www.vaers.SamedayNews.es, or by calling 339-412-2674. VAERS does not give medical advice.  6. The National Vaccine Injury Compensation Program The [employer] Vaccine Injury Compensation Program ([place]) is a  federal program that was created to compensate people who may have been injured by certain vaccines. Persons who believe they may have been injured by a vaccine can learn about the program and about filing a claim by calling 707 836 1250 or visiting the [place] website at GoldCloset.com.ee. There is a time limit to file a claim for compensation. 7. How can I learn more?  Ask your doctor. He or she can give you the vaccine package insert or suggest other sources of information.  Call your local or state health department.  Contact the Centers for Disease Control and Prevention (CDC):  Call 818 673 3447 (1-800-CDC-INFO) or  Visit CDC's website at http://hunter.com/ CDC Tdap Vaccine VIS (07/16/13)   This information is not intended to replace advice given to you by your health care provider. Make sure you discuss any questions you have with your health care provider.   Document Released: 11/08/2011 Document Revised: 05/30/2014  Document Reviewed: 08/21/2013 Elsevier Interactive Patient Education 2016 [place] DASH stands for "Dietary Approaches to Stop Hypertension." The DASH eating plan is a healthy eating plan that has been shown to reduce high blood pressure (hypertension). Additional health benefits may include reducing the risk of type 2 diabetes mellitus, heart disease, and stroke. The DASH eating plan may also help with weight loss. WHAT DO I NEED TO KNOW ABOUT THE DASH EATING PLAN? For the DASH eating plan, you will follow these general guidelines:  Choose foods with a percent daily value for sodium of less than 5% (as listed on the food label).  Use salt-free seasonings or herbs instead of table salt or sea salt.  Check with your health care provider or pharmacist before using salt substitutes.  Eat lower-sodium products, often labeled as "lower sodium" or "no salt added."  Eat fresh foods.  Eat more vegetables, fruits, and low-fat dairy products.  Choose whole grains. Look for the word "whole" as the first word in the ingredient list.  Choose fish and skinless chicken or [place] more often than red meat. Limit fish, poultry, and meat to 6 oz (170 g) each day.  Limit sweets, desserts, sugars, and sugary drinks.  Choose heart-healthy fats.  Limit cheese to 1 oz (28 g) per day.  Eat more home-cooked food and less restaurant, buffet, and fast food.  Limit fried foods.  Cook foods using methods other than frying.  Limit canned vegetables. If you do use them, rinse them well to decrease the sodium.  When eating at a restaurant, ask that your food be prepared with less salt, or no salt if possible. WHAT FOODS CAN I EAT? Seek help from a dietitian for individual calorie needs. Grains Whole grain or whole wheat bread. Brown rice. Whole grain or whole wheat pasta. Quinoa, bulgur, and whole grain cereals. Low-sodium cereals. Corn or whole wheat flour tortillas. Whole  grain cornbread. Whole grain crackers. Low-sodium crackers. Vegetables Fresh or frozen vegetables (raw, steamed, roasted, or grilled). Low-sodium or reduced-sodium tomato and vegetable juices. Low-sodium or reduced-sodium tomato sauce and paste. Low-sodium or reduced-sodium canned vegetables.  Fruits All fresh, canned (in natural juice), or frozen fruits. Meat and Other Protein Products Ground beef (85% or leaner), grass-fed beef, or beef trimmed of fat. Skinless chicken or [place]. Ground chicken or [place]. Pork trimmed of fat. All fish and seafood. Eggs. Dried beans, peas, or lentils. Unsalted nuts and seeds. Unsalted canned beans. Dairy Low-fat dairy products, such as skim or 1% milk, 2% or reduced-fat cheeses, low-fat ricotta  or cottage cheese, or plain low-fat yogurt. Low-sodium or reduced-sodium cheeses. Fats and Oils Tub margarines without trans fats. Light or reduced-fat mayonnaise and salad dressings (reduced sodium). Avocado. Safflower, olive, or canola oils. Natural peanut or almond butter. Other Unsalted popcorn and pretzels. The items listed above may not be a complete list of recommended foods or beverages. Contact your dietitian for more options. WHAT FOODS ARE NOT RECOMMENDED? Grains White bread. White pasta. White rice. Refined cornbread. Bagels and croissants. Crackers that contain trans fat. Vegetables Creamed or fried vegetables. Vegetables in a cheese sauce. Regular canned vegetables. Regular canned tomato sauce and paste. Regular tomato and vegetable juices. Fruits Dried fruits. Canned fruit in light or heavy syrup. Fruit juice. Meat and Other Protein Products Fatty cuts of meat. Ribs, chicken wings, bacon, sausage, bologna, salami, chitterlings, fatback, hot dogs, bratwurst, and packaged luncheon meats. Salted nuts and seeds. Canned beans with salt. Dairy Whole or 2% milk, cream, half-and-half, and cream cheese. Whole-fat or sweetened yogurt. Full-fat cheeses or blue  cheese. Nondairy creamers and whipped toppings. Processed cheese, cheese spreads, or cheese curds. Condiments Onion and garlic salt, seasoned salt, table salt, and sea salt. Canned and packaged gravies. Worcestershire sauce. Tartar sauce. Barbecue sauce. Teriyaki sauce. Soy sauce, including reduced sodium. Steak sauce. Fish sauce. Oyster sauce. Cocktail sauce. Horseradish. Ketchup and mustard. Meat flavorings and tenderizers. Bouillon cubes. Hot sauce. Tabasco sauce. Marinades. Taco seasonings. Relishes. Fats and Oils Butter, stick margarine, lard, shortening, ghee, and bacon fat. Coconut, palm kernel, or palm oils. Regular salad dressings. Other Pickles and olives. Salted popcorn and pretzels. The items listed above may not be a complete list of foods and beverages to avoid. Contact your dietitian for more information. WHERE CAN I FIND MORE INFORMATION? National Heart, Lung, and Blood Institute: travelstabloid.com   This information is not intended to replace advice given to you by your health care provider. Make sure you discuss any questions you have with your health care provider.   Document Released: 04/28/2011 Document Revised: 05/30/2014 Document Reviewed: 03/13/2013 Elsevier Interactive Patient Education 2016 [place] Can Quit Smoking If you are ready to quit smoking or are thinking about it, congratulations! You have chosen to help yourself be healthier and live longer! There are lots of different ways to quit smoking. Nicotine gum, nicotine patches, a nicotine inhaler, or nicotine nasal spray can help with physical craving. Hypnosis, support groups, and medicines help break the habit of smoking. TIPS TO GET OFF AND STAY OFF CIGARETTES  Learn to predict your moods. Do not let a bad situation be your excuse to have a cigarette. Some situations in your life might tempt you to have a cigarette.  Ask friends and co-workers not to smoke  around you.  Make your home smoke-free.  Never have "just one" cigarette. It leads to wanting another and another. Remind yourself of your decision to quit.  On a card, make a list of your reasons for not smoking. Read it at least the same number of times a day as you have a cigarette. Tell yourself everyday, "I do not want to smoke. I choose not to smoke."  Ask someone at home or work to help you with your plan to quit smoking.  Have something planned after you eat or have a cup of coffee. Take a walk or get other exercise to perk you up. This will help to keep you from overeating.  Try a relaxation exercise to calm you down and decrease  your stress. Remember, you may be tense and nervous the first two weeks after you quit. This will pass.  Find new activities to keep your hands busy. Play with a pen, coin, or rubber band. Doodle or draw things on paper.  Brush your teeth right after eating. This will help cut down the craving for the taste of tobacco after meals. You can try mouthwash too.  Try gum, breath mints, or diet candy to keep something in your mouth. IF YOU SMOKE AND WANT TO QUIT:  Do not stock up on cigarettes. Never buy a carton. Wait until one pack is finished before you buy another.  Never carry cigarettes with you at work or at home.  Keep cigarettes as far away from you as possible. Leave them with someone else.  Never carry matches or a lighter with you.  Ask yourself, "Do I need this cigarette or is this just a reflex?"  Bet with someone that you can quit. Put cigarette money in a piggy bank every morning. If you smoke, you give up the money. If you do not smoke, by the end of the week, you keep the money.  Keep trying. It takes 21 days to change a habit!  Talk to your doctor about using medicines to help you quit. These include nicotine replacement gum, lozenges, or skin patches.   This information is not intended to replace advice given to you by your health  care provider. Make sure you discuss any questions you have with your health care provider.   Document Released: 03/05/2009 Document Revised: 08/01/2011 Document Reviewed: 03/05/2009 Elsevier Interactive Patient Education 2016 [employer].  -  Colonoscopy A colonoscopy is an exam to look at the entire large intestine (colon). This exam can help find problems such as tumors, polyps, inflammation, and areas of bleeding. The exam takes about 1 hour.  LET [facility] CARE PROVIDER KNOW ABOUT:   Any allergies you have.  All medicines you are taking, including vitamins, herbs, eye drops, creams, and over-the-counter medicines.  Previous problems you or members of your family have had with the use of anesthetics.  Any blood disorders you have.  Previous surgeries you have had.  Medical conditions you have. RISKS AND COMPLICATIONS  Generally, this is a safe procedure. However, as with any procedure, complications can occur. Possible complications include:  Bleeding.  Tearing or rupture of the colon wall.  Reaction to medicines given during the exam.  Infection (rare). BEFORE THE PROCEDURE   Ask your health care provider about changing or stopping your regular medicines.  You may be prescribed an oral bowel prep. This involves drinking a large amount of medicated liquid, starting the day before your procedure. The liquid will cause you to have multiple loose stools until your stool is almost clear or light green. This cleans out your colon in preparation for the procedure.  Do not eat or drink anything else once you have started the bowel prep, unless your health care provider tells you it is safe to do so.  Arrange for someone to drive you home after the procedure. PROCEDURE   You will be given medicine to help you relax (sedative).  You will lie on your side with your knees bent.  A long, flexible tube with a light and camera on the end (colonoscope) will be inserted through  the rectum and into the colon. The camera sends video back to a computer screen as it moves through the colon. The colonoscope also releases  carbon dioxide gas to inflate the colon. This helps your health care provider see the area better.  During the exam, your health care provider may take a small tissue sample (biopsy) to be examined under a microscope if any abnormalities are found.  The exam is finished when the entire colon has been viewed. AFTER THE PROCEDURE   Do not drive for 24 hours after the exam.  You may have a small amount of blood in your stool.  You may pass moderate amounts of gas and have mild abdominal cramping or bloating. This is caused by the gas used to inflate your colon during the exam.  Ask when your test results will be ready and how you will get your results. Make sure you get your test results.   This information is not intended to replace advice given to you by your health care provider. Make sure you discuss any questions you have with your health care provider.   Document Released: 05/06/2000 Document Revised: 02/27/2013 Document Reviewed: 01/14/2013 Elsevier Interactive Patient Education 2016 Elsevier Inc.   - Diabetes Mellitus and Food It is important for you to manage your blood sugar (glucose) level. Your blood glucose level can be greatly affected by what you eat. Eating healthier foods in the appropriate amounts throughout the day at about the same time each day will help you control your blood glucose level. It can also help slow or prevent worsening of your diabetes mellitus. Healthy eating may even help you improve the level of your blood pressure and reach or maintain a healthy weight.  General recommendations for healthful eating and cooking habits include:  Eating meals and snacks regularly. Avoid going long periods of time without eating to lose weight.  Eating a diet that consists mainly of plant-based foods, such as fruits, vegetables,  nuts, legumes, and whole grains.  Using low-heat cooking methods, such as baking, instead of high-heat cooking methods, such as deep frying. Work with your dietitian to make sure you understand how to use the Nutrition Facts information on food labels. HOW CAN FOOD AFFECT ME? Carbohydrates Carbohydrates affect your blood glucose level more than any other type of food. Your dietitian will help you determine how many carbohydrates to eat at each meal and teach you how to count carbohydrates. Counting carbohydrates is important to keep your blood glucose at a healthy level, especially if you are using insulin or taking certain medicines for diabetes mellitus. Alcohol Alcohol can cause sudden decreases in blood glucose (hypoglycemia), especially if you use insulin or take certain medicines for diabetes mellitus. Hypoglycemia can be a life-threatening condition. Symptoms of hypoglycemia (sleepiness, dizziness, and disorientation) are similar to symptoms of having too much alcohol.  If your health care provider has given you approval to drink alcohol, do so in moderation and use the following guidelines:  Women should not have more than one drink per day, and men should not have more than two drinks per day. One drink is equal to:  12 oz of beer.  5 oz of wine.  1 oz of hard liquor.  Do not drink on an empty stomach.  Keep yourself hydrated. Have water, diet soda, or unsweetened iced tea.  Regular soda, juice, and other mixers might contain a lot of carbohydrates and should be counted. WHAT FOODS ARE NOT RECOMMENDED? As you make food choices, it is important to remember that all foods are not the same. Some foods have fewer nutrients per serving than other foods, even though they  might have the same number of calories or carbohydrates. It is difficult to get your body what it needs when you eat foods with fewer nutrients. Examples of foods that you should avoid that are high in calories and  carbohydrates but low in nutrients include:  Trans fats (most processed foods list trans fats on the Nutrition Facts label).  Regular soda.  Juice.  Candy.  Sweets, such as cake, pie, doughnuts, and cookies.  Fried foods. WHAT FOODS CAN I EAT? Eat nutrient-rich foods, which will nourish your body and keep you healthy. The food you should eat also will depend on several factors, including:  The calories you need.  The medicines you take.  Your weight.  Your blood glucose level.  Your blood pressure level.  Your cholesterol level. You should eat a variety of foods, including:  Protein.  Lean cuts of meat.  Proteins low in saturated fats, such as fish, egg whites, and beans. Avoid processed meats.  Fruits and vegetables.  Fruits and vegetables that may help control blood glucose levels, such as apples, mangoes, and yams.  Dairy products.  Choose fat-free or low-fat dairy products, such as milk, yogurt, and cheese.  Grains, bread, pasta, and rice.  Choose whole grain products, such as multigrain bread, whole oats, and brown rice. These foods may help control blood pressure.  Fats.  Foods containing healthful fats, such as nuts, avocado, olive oil, canola oil, and fish. DOES EVERYONE WITH DIABETES MELLITUS HAVE THE SAME MEAL PLAN? Because every person with diabetes mellitus is different, there is not one meal plan that works for everyone. It is very important that you meet with a dietitian who will help you create a meal plan that is just right for you.   This information is not intended to replace advice given to you by your health care provider. Make sure you discuss any questions you have with your health care provider.   Document Released: 02/03/2005 Document Revised: 05/30/2014 Document Reviewed: 04/05/2013 Elsevier Interactive Patient Education 2016 [place] for Eating Away From Home If You Have Diabetes Controlling your level of blood  glucose, also known as blood sugar, can be challenging. It can be even more difficult when you do not prepare your own meals. The following tips can help you manage your diabetes when you eat away from home. PLANNING AHEAD Plan ahead if you know you will be eating away from home:  Ask your health care provider how to time meals and medicine if you are taking insulin.  Make a list of restaurants near you that offer healthy choices. If they have a carry-out menu, take it home and plan what you will order ahead of time.  Look up the restaurant you want to eat at online. Many chain and fast-food restaurants list nutritional information online. Use this information to choose the healthiest options and to calculate how many carbohydrates will be in your meal.  Use a carbohydrate-counting book or mobile app to look up the carbohydrate content and serving size of the foods you want to eat.  Become familiar with serving sizes and learn to recognize how many servings are in a portion. This will allow you to estimate how many carbohydrates you can eat. FREE FOODS A "free food" is any food or drink that has less than 5 g of carbohydrates per serving. Free foods include:  Many vegetables.  Hard boiled eggs.  Nuts or seeds.  Olives.  Cheeses.  Meats. These types of  foods make good appetizer choices and are often available at salad bars. Lemon juice, vinegar, or a low-calorie salad dressing of fewer than 20 calories per serving can be used as a "free" salad dressing.  CHOICES TO REDUCE CARBOHYDRATES  Substitute nonfat sweetened yogurt with a sugar-free yogurt. Yogurt made from soy milk may also be used, but you will still want a sugar-free or plain option to choose a lower carbohydrate amount.  Ask your server to take away the bread basket or chips from your table.  Order fresh fruit. A salad bar often offers fresh fruit choices. Avoid canned fruit because it is usually packed in sugar or  syrup.  Order a salad, and eat it without dressing. Or, create a "free" salad dressing.  Ask for substitutions. For example, instead of [place] fries, request an order of a vegetable such as salad, green beans, or broccoli. OTHER TIPS   If you take insulin, take the insulin once your food arrives to your table. This will ensure your insulin and food are timed correctly.  Ask your server about the portion size before your order, and ask for a take-out box if the portion has more servings than you should have. When your food comes, leave the amount you should have on the plate, and put the rest in the take-out box.  Consider splitting an entree with someone and ordering a side salad.   This information is not intended to replace advice given to you by your health care provider. Make sure you discuss any questions you have with your health care provider.   Document Released: 05/09/2005 Document Revised: 01/28/2015 Document Reviewed: 08/06/2013 Elsevier Interactive Patient Education [employer].

## 2016-03-16 LAB — TESTOSTERONE TOTAL,FREE,BIO, MALES
Albumin: 3.9 g/dL (ref 3.6–5.1)
SEX HORMONE BINDING: 57 nmol/L (ref 22–77)
Testosterone, Bioavailable: 111.1 ng/dL (ref 110.0–575.0)
Testosterone, Free: 61.9 pg/mL (ref 46.0–224.0)
Testosterone: 692 ng/dL (ref 250–827)

## 2016-03-16 LAB — IRON,TIBC AND FERRITIN PANEL
%SAT: 21 % (ref 15–60)
Ferritin: 63 ng/mL (ref 20–380)
IRON: 57 ug/dL (ref 50–180)
TIBC: 278 ug/dL (ref 250–425)

## 2016-03-16 LAB — HEPATITIS C ANTIBODY: HCV Ab: NEGATIVE

## 2016-03-17 ENCOUNTER — Telehealth: Payer: Self-pay | Admitting: Internal Medicine

## 2016-03-17 NOTE — Telephone Encounter (Signed)
Will forward to pcp

## 2016-03-17 NOTE — Telephone Encounter (Signed)
Patient called the office to speak with nurse regarding his lab results and to request pain medication since he will be passing kidney stones. Please follow up.   Thank you.

## 2016-03-18 NOTE — Telephone Encounter (Signed)
Patient called to find out the status of his request for pain medication and lab results. Informed pt that PCP was out of the office but that note was routed to her. Pending her response.

## 2016-03-21 NOTE — Telephone Encounter (Signed)
Called pt, confirmed dob. Still trying to pass kidney stones, pain bad, but has a lot of tylenol #3 he said. I encouraged him to drink more fluids.  Reviewed labs with him. Has not picked up metformin yet for predm.

## 2016-03-22 ENCOUNTER — Other Ambulatory Visit: Payer: Self-pay | Admitting: Internal Medicine

## 2016-03-22 MED ORDER — ACETAMINOPHEN-CODEINE #3 300-30 MG PO TABS: 1.0000 | ORAL_TABLET | ORAL | 0 refills | Status: DC | PRN

## 2016-03-22 MED FILL — ACETAMINOPHEN/COD #3 TABLET: 300-30 | 8 days supply | Qty: 50 | Fill #0

## 2016-03-22 NOTE — Telephone Encounter (Signed)
Pt contacted the office and stated he is passing the kidney stones and is in a lot of pain. Dr. Janne Napoleon states she will rx for tylenol 3 to help with the pain but will need to drink plenty of fluids. Pt is aware and will come by and get the rx

## 2016-03-22 NOTE — Progress Notes (Signed)
-   pt passing kidney stone, rx tylenol #3, pain contract to sign when pick up.

## 2016-03-30 ENCOUNTER — Telehealth: Payer: Self-pay | Admitting: Internal Medicine

## 2016-03-30 DIAGNOSIS — Z0100 Encounter for examination of eyes and vision without abnormal findings: Secondary | ICD-10-CM

## 2016-03-30 NOTE — Telephone Encounter (Signed)
Will forward to pcp

## 2016-03-30 NOTE — Telephone Encounter (Signed)
I put in referal. May take 1- 2 wks for referral specialist to get to him. thx

## 2016-03-30 NOTE — Telephone Encounter (Signed)
Patient called requesting referral to eye doctor for a routine vision screen.   [place] [phone] [place] Please f/u

## 2016-03-30 NOTE — Telephone Encounter (Signed)
Patient fully hipaa verified.  RN advised patient per Dr. Janne Napoleon:  I put in referal. May take 1- 2 wks for referral specialist to get to him.  Patient states has appointment scheduled for December 8th, 2017 at  8am

## 2016-04-21 ENCOUNTER — Encounter: Payer: Self-pay | Admitting: Gastroenterology

## 2016-05-17 ENCOUNTER — Ambulatory Visit: Payer: Self-pay | Admitting: Cardiovascular Disease

## 2016-05-25 ENCOUNTER — Encounter: Payer: Self-pay | Admitting: Cardiovascular Disease

## 2016-05-26 ENCOUNTER — Telehealth: Payer: Self-pay | Admitting: *Deleted

## 2016-05-26 DIAGNOSIS — I219 Acute myocardial infarction, unspecified: Secondary | ICD-10-CM | POA: Insufficient documentation

## 2016-05-26 NOTE — Telephone Encounter (Signed)
Dr. Havery Moros, Please review this patient's latest cardiac office visit and recent ER visit in August.  Patinet does have a history of MI, but the recent cause of chest pain could be GI related?  Is he okay for a direct LEC or would you like an OV?   Thanks, Vinnie Level

## 2016-05-26 NOTE — Telephone Encounter (Signed)
He had a recent echo which looked okay and just saw his primary care who referred him for colonoscopy. If he has no active cardiac symptoms I think he is okay to proceed. If any questions otherwise we can reach out to his primary care if any further testing is needed, but think this is less likely. Thanks

## 2016-06-02 ENCOUNTER — Ambulatory Visit (AMBULATORY_SURGERY_CENTER): Payer: Self-pay | Admitting: *Deleted

## 2016-06-02 VITALS — Ht 69.0 in | Wt 172.0 lb

## 2016-06-02 DIAGNOSIS — Z800 Family history of malignant neoplasm of digestive organs: Secondary | ICD-10-CM

## 2016-06-02 DIAGNOSIS — Z1211 Encounter for screening for malignant neoplasm of colon: Secondary | ICD-10-CM

## 2016-06-02 MED ORDER — NA SULFATE-K SULFATE-MG SULF 17.5-3.13-1.6 GM/177ML PO SOLN: ORAL | 0 refills | Status: DC

## 2016-06-02 NOTE — Progress Notes (Signed)
Patient denies any allergies to eggs or soy. Patient denies any problems with anesthesia/sedation. Patient denies any oxygen use at home and does not take any diet/weight loss medications. Patient denies any heart problems,concerns or symptoms since August 2017. No current chest pain per pt. He states he has not taken Nitroglycerin.

## 2016-06-09 ENCOUNTER — Encounter (HOSPITAL_COMMUNITY): Payer: Self-pay

## 2016-06-09 ENCOUNTER — Emergency Department (HOSPITAL_COMMUNITY): Payer: Medicaid Other

## 2016-06-09 ENCOUNTER — Emergency Department (HOSPITAL_COMMUNITY)
Admission: EM | Admit: 2016-06-09 | Discharge: 2016-06-09 | Disposition: A | Discharge status: Home / Self Care | Payer: Medicaid Other | Attending: Emergency Medicine | Admitting: Emergency Medicine

## 2016-06-09 DIAGNOSIS — Z79899 Other long term (current) drug therapy: Secondary | ICD-10-CM | POA: Diagnosis not present

## 2016-06-09 DIAGNOSIS — Z7984 Long term (current) use of oral hypoglycemic drugs: Secondary | ICD-10-CM | POA: Diagnosis not present

## 2016-06-09 DIAGNOSIS — E119 Type 2 diabetes mellitus without complications: Secondary | ICD-10-CM | POA: Diagnosis not present

## 2016-06-09 DIAGNOSIS — I252 Old myocardial infarction: Secondary | ICD-10-CM | POA: Insufficient documentation

## 2016-06-09 DIAGNOSIS — I10 Essential (primary) hypertension: Secondary | ICD-10-CM | POA: Diagnosis not present

## 2016-06-09 DIAGNOSIS — Z7982 Long term (current) use of aspirin: Secondary | ICD-10-CM | POA: Insufficient documentation

## 2016-06-09 DIAGNOSIS — F17210 Nicotine dependence, cigarettes, uncomplicated: Secondary | ICD-10-CM | POA: Diagnosis not present

## 2016-06-09 DIAGNOSIS — J111 Influenza due to unidentified influenza virus with other respiratory manifestations: Secondary | ICD-10-CM

## 2016-06-09 DIAGNOSIS — I2510 Atherosclerotic heart disease of native coronary artery without angina pectoris: Secondary | ICD-10-CM | POA: Diagnosis not present

## 2016-06-09 DIAGNOSIS — Z8673 Personal history of transient ischemic attack (TIA), and cerebral infarction without residual deficits: Secondary | ICD-10-CM | POA: Insufficient documentation

## 2016-06-09 DIAGNOSIS — R05 Cough: Secondary | ICD-10-CM | POA: Diagnosis not present

## 2016-06-09 DIAGNOSIS — R69 Illness, unspecified: Secondary | ICD-10-CM

## 2016-06-09 LAB — CBC
HEMATOCRIT: 40.4 % (ref 39.0–52.0)
Hemoglobin: 13.6 g/dL (ref 13.0–17.0)
MCH: 24.1 pg — AB (ref 26.0–34.0)
MCHC: 33.7 g/dL (ref 30.0–36.0)
MCV: 71.6 fL — AB (ref 78.0–100.0)
Platelets: 257 10*3/uL (ref 150–400)
RBC: 5.64 MIL/uL (ref 4.22–5.81)
RDW: 14.7 % (ref 11.5–15.5)
WBC: 11.1 10*3/uL — AB (ref 4.0–10.5)

## 2016-06-09 LAB — COMPREHENSIVE METABOLIC PANEL
ALT: 17 U/L (ref 17–63)
AST: 20 U/L (ref 15–41)
Albumin: 3.7 g/dL (ref 3.5–5.0)
Alkaline Phosphatase: 105 U/L (ref 38–126)
Anion gap: 9 (ref 5–15)
BILIRUBIN TOTAL: 0.7 mg/dL (ref 0.3–1.2)
BUN: 7 mg/dL (ref 6–20)
CALCIUM: 9.1 mg/dL (ref 8.9–10.3)
CO2: 22 mmol/L (ref 22–32)
CREATININE: 1 mg/dL (ref 0.61–1.24)
Chloride: 103 mmol/L (ref 101–111)
Glucose, Bld: 99 mg/dL (ref 65–99)
Potassium: 4.7 mmol/L (ref 3.5–5.1)
Sodium: 134 mmol/L — ABNORMAL LOW (ref 135–145)
TOTAL PROTEIN: 7.5 g/dL (ref 6.5–8.1)

## 2016-06-09 LAB — I-STAT TROPONIN, ED: TROPONIN I, POC: 0.01 ng/mL (ref 0.00–0.08)

## 2016-06-09 MED ORDER — ONDANSETRON HCL 4 MG/2ML IJ SOLN
4.0000 mg | Freq: Once | INTRAMUSCULAR | Status: AC
  Administered 2016-06-09: 4 mg via INTRAVENOUS
  Filled 2016-06-09: qty 2

## 2016-06-09 MED ORDER — HYDROCODONE-HOMATROPINE 5-1.5 MG/5ML PO SYRP: 5.0000 mL | ORAL_SOLUTION | Freq: Four times a day (QID) | ORAL | 0 refills | Status: DC | PRN

## 2016-06-09 MED ORDER — ACETAMINOPHEN 325 MG PO TABS
ORAL_TABLET | ORAL | Status: AC
  Filled 2016-06-09: qty 2

## 2016-06-09 MED ORDER — SODIUM CHLORIDE 0.9 % IV BOLUS (SEPSIS)
1000.0000 mL | Freq: Once | INTRAVENOUS | Status: AC
  Administered 2016-06-09: 1000 mL via INTRAVENOUS

## 2016-06-09 MED ORDER — HYDROMORPHONE HCL 2 MG/ML IJ SOLN
0.5000 mg | Freq: Once | INTRAMUSCULAR | Status: AC
  Administered 2016-06-09: 0.5 mg via INTRAVENOUS
  Filled 2016-06-09: qty 1

## 2016-06-09 MED ORDER — OSELTAMIVIR PHOSPHATE 75 MG PO CAPS: 75.0000 mg | ORAL_CAPSULE | Freq: Two times a day (BID) | ORAL | 0 refills | Status: DC

## 2016-06-09 MED ORDER — ACETAMINOPHEN 325 MG PO TABS
650.0000 mg | ORAL_TABLET | Freq: Once | ORAL | Status: AC | PRN
Start: 2016-06-09 — End: 2016-06-09
  Administered 2016-06-09: 650 mg via ORAL

## 2016-06-09 MED ORDER — MORPHINE SULFATE (PF) 4 MG/ML IV SOLN
4.0000 mg | Freq: Once | INTRAVENOUS | Status: AC
  Administered 2016-06-09: 4 mg via INTRAVENOUS
  Filled 2016-06-09: qty 1
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
## 2016-06-09 NOTE — ED Triage Notes (Addendum)
[facility]- pt here with cough, and discomfort in the chest with coughing. Pt also reports nausea vomiting and feels dehydrated. Pt denies chest pain today. Temp 103 with EMS. HR 88, SPO2 98%. 150/90 [id number].

## 2016-06-10 LAB — INFLUENZA PANEL BY PCR (TYPE A & B)
INFLAPCR: NEGATIVE
Influenza B By PCR: NEGATIVE
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
## 2016-06-13 ENCOUNTER — Emergency Department (HOSPITAL_COMMUNITY): Payer: Medicaid Other

## 2016-06-13 ENCOUNTER — Emergency Department (HOSPITAL_COMMUNITY)
Admission: EM | Admit: 2016-06-13 | Discharge: 2016-06-13 | Disposition: A | Discharge status: Home / Self Care | Payer: Medicaid Other | Attending: Emergency Medicine | Admitting: Emergency Medicine

## 2016-06-13 ENCOUNTER — Encounter (HOSPITAL_COMMUNITY): Payer: Self-pay

## 2016-06-13 DIAGNOSIS — F17210 Nicotine dependence, cigarettes, uncomplicated: Secondary | ICD-10-CM | POA: Insufficient documentation

## 2016-06-13 DIAGNOSIS — I2510 Atherosclerotic heart disease of native coronary artery without angina pectoris: Secondary | ICD-10-CM | POA: Diagnosis not present

## 2016-06-13 DIAGNOSIS — I252 Old myocardial infarction: Secondary | ICD-10-CM | POA: Diagnosis not present

## 2016-06-13 DIAGNOSIS — Z7982 Long term (current) use of aspirin: Secondary | ICD-10-CM | POA: Insufficient documentation

## 2016-06-13 DIAGNOSIS — Z8673 Personal history of transient ischemic attack (TIA), and cerebral infarction without residual deficits: Secondary | ICD-10-CM | POA: Diagnosis not present

## 2016-06-13 DIAGNOSIS — Z7984 Long term (current) use of oral hypoglycemic drugs: Secondary | ICD-10-CM | POA: Diagnosis not present

## 2016-06-13 DIAGNOSIS — R1084 Generalized abdominal pain: Secondary | ICD-10-CM | POA: Diagnosis not present

## 2016-06-13 DIAGNOSIS — E119 Type 2 diabetes mellitus without complications: Secondary | ICD-10-CM | POA: Insufficient documentation

## 2016-06-13 DIAGNOSIS — I10 Essential (primary) hypertension: Secondary | ICD-10-CM | POA: Diagnosis not present

## 2016-06-13 DIAGNOSIS — R112 Nausea with vomiting, unspecified: Secondary | ICD-10-CM | POA: Insufficient documentation

## 2016-06-13 LAB — I-STAT TROPONIN, ED: Troponin i, poc: 0 ng/mL (ref 0.00–0.08)

## 2016-06-13 LAB — CBC WITH DIFFERENTIAL/PLATELET
BASOS ABS: 0 10*3/uL (ref 0.0–0.1)
Basophils Relative: 0 %
EOS ABS: 0 10*3/uL (ref 0.0–0.7)
Eosinophils Relative: 0 %
HEMATOCRIT: 48.6 % (ref 39.0–52.0)
HEMOGLOBIN: 17.1 g/dL — AB (ref 13.0–17.0)
LYMPHS PCT: 13 %
Lymphs Abs: 1.4 10*3/uL (ref 0.7–4.0)
MCH: 24.7 pg — ABNORMAL LOW (ref 26.0–34.0)
MCHC: 35.2 g/dL (ref 30.0–36.0)
MCV: 70.1 fL — ABNORMAL LOW (ref 78.0–100.0)
MONOS PCT: 4 %
Monocytes Absolute: 0.4 10*3/uL (ref 0.1–1.0)
NEUTROS ABS: 8.6 10*3/uL — AB (ref 1.7–7.7)
NEUTROS PCT: 83 %
Platelets: 352 10*3/uL (ref 150–400)
RBC: 6.93 MIL/uL — ABNORMAL HIGH (ref 4.22–5.81)
RDW: 14.2 % (ref 11.5–15.5)
WBC: 10.4 10*3/uL (ref 4.0–10.5)

## 2016-06-13 LAB — COMPREHENSIVE METABOLIC PANEL
ALBUMIN: 4.6 g/dL (ref 3.5–5.0)
ALT: 15 U/L — ABNORMAL LOW (ref 17–63)
ANION GAP: 16 — AB (ref 5–15)
AST: 20 U/L (ref 15–41)
Alkaline Phosphatase: 116 U/L (ref 38–126)
BUN: 21 mg/dL — ABNORMAL HIGH (ref 6–20)
CALCIUM: 10.3 mg/dL (ref 8.9–10.3)
CO2: 26 mmol/L (ref 22–32)
Chloride: 94 mmol/L — ABNORMAL LOW (ref 101–111)
Creatinine, Ser: 1.22 mg/dL (ref 0.61–1.24)
GFR calc non Af Amer: >60 mL/min (ref >60)
Glucose, Bld: 151 mg/dL — ABNORMAL HIGH (ref 65–99)
POTASSIUM: 4.1 mmol/L (ref 3.5–5.1)
SODIUM: 136 mmol/L (ref 135–145)
TOTAL PROTEIN: 9.9 g/dL — AB (ref 6.5–8.1)
Total Bilirubin: 0.7 mg/dL (ref 0.3–1.2)

## 2016-06-13 MED ORDER — GI COCKTAIL ~~LOC~~
30.0000 mL | Freq: Once | ORAL | Status: AC
  Administered 2016-06-13: 30 mL via ORAL
  Filled 2016-06-13: qty 30

## 2016-06-13 MED ORDER — METOCLOPRAMIDE HCL 5 MG/ML IJ SOLN
10.0000 mg | Freq: Once | INTRAMUSCULAR | Status: AC
  Administered 2016-06-13: 10 mg via INTRAVENOUS
  Filled 2016-06-13: qty 2

## 2016-06-13 MED ORDER — ONDANSETRON 4 MG PO TBDP
4.0000 mg | ORAL_TABLET | Freq: Once | ORAL | Status: AC
  Administered 2016-06-13: 4 mg via ORAL

## 2016-06-13 MED ORDER — ONDANSETRON 4 MG PO TBDP
ORAL_TABLET | ORAL | Status: AC
  Filled 2016-06-13: qty 1

## 2016-06-13 MED ORDER — SODIUM CHLORIDE 0.9 % IV BOLUS (SEPSIS)
1000.0000 mL | Freq: Once | INTRAVENOUS | Status: AC
  Administered 2016-06-13: 1000 mL via INTRAVENOUS

## 2016-06-13 MED ORDER — ONDANSETRON 4 MG PO TBDP
4.0000 mg | ORAL_TABLET | Freq: Once | ORAL | Status: AC
  Administered 2016-06-13: 4 mg via ORAL
  Filled 2016-06-13: qty 1

## 2016-06-13 MED ORDER — POLYETHYLENE GLYCOL 3350 17 GM/SCOOP PO POWD: ORAL | 0 refills | Status: DC

## 2016-06-13 MED ORDER — IOPAMIDOL (ISOVUE-300) INJECTION 61%
INTRAVENOUS | Status: AC
  Administered 2016-06-13: 100 mL
  Filled 2016-06-13: qty 100

## 2016-06-13 MED ORDER — FENTANYL CITRATE (PF) 100 MCG/2ML IJ SOLN
50.0000 ug | INTRAMUSCULAR | Status: DC | PRN
  Administered 2016-06-13 (×2): 50 ug via INTRAVENOUS
  Filled 2016-06-13 (×2): qty 2

## 2016-06-13 MED ORDER — ACETAMINOPHEN 500 MG PO TABS
1000.0000 mg | ORAL_TABLET | Freq: Once | ORAL | Status: AC
  Administered 2016-06-13: 1000 mg via ORAL
  Filled 2016-06-13: qty 2

## 2016-06-13 NOTE — ED Notes (Signed)
Pt reports he is nauseated and has had 2 episodes of emesis.
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
## 2016-06-13 NOTE — ED Triage Notes (Signed)
Pt arrives via EMS due to flu dx on 1/18; pt states no new symptoms but he does not feel he is getting better; EMS states pt did not get Tamiflu due to cost; Pt states he has been taken OTC meds at home; pt states continued nausea and body aches; Pt a&ox 4 on arrival. Pt ambulated in triage

## 2016-06-13 NOTE — ED Notes (Signed)
Pt reports he is still nauseated; pain improved; drinking ginger ale

## 2016-06-13 NOTE — ED Notes (Signed)
Pt denying wanting pain meds; mouth swabs given; no further needs.

## 2016-06-13 NOTE — ED Notes (Signed)
Pt verbalized understanding of d/c instructions and has no further questions. Pt stable and NAD.  

## 2016-06-13 NOTE — ED Notes (Signed)
Pt returned from Ct and now desires pain meds.

## 2016-06-15 ENCOUNTER — Telehealth: Payer: Self-pay | Admitting: Gastroenterology

## 2016-06-15 NOTE — Telephone Encounter (Signed)
This phone note was routed to Admitting.  I called patient and he is concerned about his "stomach" and was wondering if he should still have his procedure tomorrow due to him going to E.D[facility] X 2 in the past week with vomiting.  He wants to have this done and find out what is going on.  He will come in as scheduled tomorrow.  I advised him not to have anything by mouth 3 hours prior to procedure and to not take his diabetic meds tomorrow.  He agreed to do so and all questions/concerns were answered.

## 2016-06-16 ENCOUNTER — Telehealth: Payer: Self-pay | Admitting: Internal Medicine

## 2016-06-16 ENCOUNTER — Ambulatory Visit (AMBULATORY_SURGERY_CENTER): Payer: Medicaid Other | Admitting: Gastroenterology

## 2016-06-16 ENCOUNTER — Encounter: Payer: Self-pay | Admitting: Gastroenterology

## 2016-06-16 ENCOUNTER — Telehealth: Payer: Self-pay | Admitting: Gastroenterology

## 2016-06-16 VITALS — BP 127/91 | HR 79 | Temp 98.6°F | Resp 18 | Ht 69.0 in | Wt 172.0 lb

## 2016-06-16 DIAGNOSIS — Z1211 Encounter for screening for malignant neoplasm of colon: Secondary | ICD-10-CM

## 2016-06-16 DIAGNOSIS — K2900 Acute gastritis without bleeding: Secondary | ICD-10-CM | POA: Diagnosis not present

## 2016-06-16 DIAGNOSIS — R112 Nausea with vomiting, unspecified: Secondary | ICD-10-CM

## 2016-06-16 DIAGNOSIS — Z1212 Encounter for screening for malignant neoplasm of rectum: Secondary | ICD-10-CM

## 2016-06-16 DIAGNOSIS — R1013 Epigastric pain: Secondary | ICD-10-CM | POA: Diagnosis not present

## 2016-06-16 LAB — GLUCOSE, CAPILLARY: GLUCOSE-CAPILLARY: 118 mg/dL — AB (ref 65–99)

## 2016-06-16 MED ORDER — OMEPRAZOLE 20 MG PO CPDR: 40.0000 mg | DELAYED_RELEASE_CAPSULE | Freq: Every day | ORAL | 3 refills | Status: DC

## 2016-06-16 MED ORDER — SODIUM CHLORIDE 0.9 % IV SOLN: 500.0000 mL | INTRAVENOUS | Status: DC

## 2016-06-16 MED ORDER — ONDANSETRON HCL 4 MG PO TABS: 4.0000 mg | ORAL_TABLET | Freq: Three times a day (TID) | ORAL | 1 refills | Status: DC | PRN

## 2016-06-16 MED ORDER — SUCRALFATE 1 GM/10ML PO SUSP: 1.0000 g | Freq: Four times a day (QID) | ORAL | 3 refills | Status: DC
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
## 2016-06-16 NOTE — Telephone Encounter (Signed)
Pt calling requesting to speak to nurse or PCP about concerns with his health States he has been to the hospital twice with chest pain and vomiting Pt states he continues to be dizzy and weak and the hospital visits have not helped him much    Looked on PCP's schedule but nothing is available at this time

## 2016-06-16 NOTE — Patient Instructions (Signed)
YOU HAD AN ENDOSCOPIC PROCEDURE TODAY AT [facility]:   Refer to the procedure report that was given to you for any specific questions about what was found during the examination.  If the procedure report does not answer your questions, please call your gastroenterologist to clarify.  If you requested that your care partner not be given the details of your procedure findings, then the procedure report has been included in a sealed envelope for you to review at your convenience later.  YOU SHOULD EXPECT: Some feelings of bloating in the abdomen. Passage of more gas than usual.  Walking can help get rid of the air that was put into your GI tract during the procedure and reduce the bloating. If you had a lower endoscopy (such as a colonoscopy or flexible sigmoidoscopy) you may notice spotting of blood in your stool or on the toilet paper. If you underwent a bowel prep for your procedure, you may not have a normal bowel movement for a few days.  Please Note:  You might notice some irritation and congestion in your nose or some drainage.  This is from the oxygen used during your procedure.  There is no need for concern and it should clear up in a day or so.  SYMPTOMS TO REPORT IMMEDIATELY:   Following lower endoscopy (colonoscopy or flexible sigmoidoscopy):  Excessive amounts of blood in the stool  Significant tenderness or worsening of abdominal pains  Swelling of the abdomen that is new, acute  Fever of 100F or higher   Following upper endoscopy (EGD)  Vomiting of blood or coffee ground material  New chest pain or pain under the shoulder blades  Painful or persistently difficult swallowing  New shortness of breath  Fever of 100F or higher  Black, tarry-looking stools  For urgent or emergent issues, a gastroenterologist can be reached at any hour by calling [phone].  Please read all handouts given to you by your recovery nurse.   DIET: Liquid diet.  Drink plenty of  fluids but you should avoid alcoholic beverages for 24 hours. Start omeprazole 40 mg twice day and zofran 4 mg tablet 1-2 tabs every 6-8 hours as needed for nausea.  ACTIVITY:  You should plan to take it easy for the rest of today and you should NOT DRIVE or use heavy machinery until tomorrow (because of the sedation medicines used during the test).    FOLLOW UP: Our staff will call the number listed on your records the next business day following your procedure to check on you and address any questions or concerns that you may have regarding the information given to you following your procedure. If we do not reach you, we will leave a message.  However, if you are feeling well and you are not experiencing any problems, there is no need to return our call.  We will assume that you have returned to your regular daily activities without incident.  If any biopsies were taken you will be contacted by phone or by letter within the next 1-3 weeks.  Please call us at [phone] if you have not heard about the biopsies in 3 weeks.    SIGNATURES/CONFIDENTIALITY: You and/or your care partner have signed paperwork which will be entered into your electronic medical record.  These signatures attest to the fact that that the information above on your After Visit Summary has been reviewed and is understood.  Full responsibility of the confidentiality of this discharge information lies with  you and/or your care-partner.  Thank you for letting us take care of your healthcare needs today.
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
## 2016-06-16 NOTE — Op Note (Signed)
[facility] Patient Name: Troy Mclaughlin Procedure Date: 06/16/2016 1:36 PM MRN: [id number] Endoscopist: Remo Lipps P. Levi Crass MD, MD Age: 61 Referring MD:  Date of Birth: August 19, 1955 Gender: Male Account #: [id number] Procedure:                Colonoscopy Indications:              Screening for colorectal malignant neoplasm Medicines:                Monitored Anesthesia Care Procedure:                Pre-Anesthesia Assessment:                           - Prior to the procedure, a History and Physical                            was performed, and patient medications and                            allergies were reviewed. The patient's tolerance of                            previous anesthesia was also reviewed. The risks                            and benefits of the procedure and the sedation                            options and risks were discussed with the patient.                            All questions were answered, and informed consent                            was obtained. Prior Anticoagulants: The patient has                            taken no previous anticoagulant or antiplatelet                            agents. ASA Grade Assessment: III - A patient with                            severe systemic disease. After reviewing the risks                            and benefits, the patient was deemed in                            satisfactory condition to undergo the procedure.                           After obtaining informed consent, the colonoscope  was passed under direct vision. Throughout the                            procedure, the patient's blood pressure, pulse, and                            oxygen saturations were monitored continuously. The                            Model CF-HQ190L [id number]) scope was introduced                            through the anus with the intention of advancing to                            the  cecum. The scope was advanced to the sigmoid                            colon before the procedure was aborted. Medications                            were given. The colonoscopy was performed without                            difficulty. The patient tolerated the procedure                            well. The quality of the bowel preparation was                            poor. The rectum was photographed. Scope In: 1:57:21 PM Scope Out: 1:58:19 PM Total Procedure Duration: 0 hours 0 minutes 58 seconds  Findings:                 The perianal and digital rectal examinations were                            normal.                           A large amount of semi-solid stool was found in the                            rectum and in the sigmoid colon, interfering with                            visualization. The exam was aborted, prep                            inadequate for screening purposes. Complications:            No immediate complications. Estimated blood loss:                            None. Estimated  Blood Loss:     Estimated blood loss: none. Impression:               - Preparation of the colon was poor.                           - Stool in the rectum and in the sigmoid colon,                            procedure aborted Recommendation:           - Patient has a contact number available for                            emergencies. The signs and symptoms of potential                            delayed complications were discussed with the                            patient. Return to normal activities tomorrow.                            Written discharge instructions were provided to the                            patient.                           - Resume previous diet.                           - Continue present medications.                           - Repeat colonoscopy because the bowel preparation                            was suboptimal, reschedule at the patient's                             convenience Remo Lipps P. Kyel Purk MD, MD 06/16/2016 2:13:20 PM This report has been signed electronically.

## 2016-06-16 NOTE — Progress Notes (Signed)
A and O x3. Report to RN. Tolerated MAC anesthesia well.Teeth unchanged after procedure.

## 2016-06-16 NOTE — Progress Notes (Signed)
Called to room to assist during endoscopic procedure.  Patient ID and intended procedure confirmed with present staff. Received instructions for my participation in the procedure from the performing physician.  

## 2016-06-16 NOTE — Telephone Encounter (Signed)
Will forward to pcp and [place] is there any way we squeeze him this morning to see angela

## 2016-06-16 NOTE — Progress Notes (Signed)
Pt reported he went to the ED 06-09-16 & 06-13-16.  Having epigastric abdominal pain.  Pt has a hx of gastric ulcers he reported.  Pt said he has had N&V started Wednesday. Pt reported he has vomited each day since then and epigastric abd pain off and on.  Pt said he would like to have upper endo done.  Pt drank prep last night and vomited within 10 minutes.  Drank 2nd bottle of prep this am and vomited within 10 minutes again.  Pt said his last bm was tea colored liquid with some sediment.  Dr. Havery Moros was called and he said he would like to do EGD and will attempt colonoscopy today.  Gaye Pollack, CRNA was also made aware of pt's complaints.  Margaretmary Eddy, RN called Amy Mason Jim, insurance and asked if ok to add EGD to be done today.  Per Yolonda Kida, CMA pt ok to have EGD today.  Consent was obtained and pt signed per [facility].  Dr. Havery Moros said he will go EGD and will attempt colon today and see if prep was adaquate.  maw

## 2016-06-17 ENCOUNTER — Telehealth: Payer: Self-pay

## 2016-06-17 ENCOUNTER — Telehealth: Payer: Self-pay | Admitting: Gastroenterology

## 2016-06-17 LAB — GLUCOSE, CAPILLARY: GLUCOSE-CAPILLARY: 101 mg/dL — AB (ref 65–99)

## 2016-06-17 NOTE — Telephone Encounter (Signed)
  Follow up Call-  Call back number 06/16/2016  Post procedure Call Back phone  # [phone] cell wife- Santa Lighter  Permission to leave phone message Yes  Some recent data might be hidden    Patient was called for follow up after his procedure on 06/16/2016. No answer at the number given for follow up phone call. Mail box was full so I was not able to leave a message.

## 2016-06-17 NOTE — Telephone Encounter (Signed)
  Follow up Call-  Call back number 06/16/2016  Post procedure Call Back phone  # [phone] cell wife- Santa Lighter  Permission to leave phone message Yes  Some recent data might be hidden     Patient questions:  Do you have a fever, pain , or abdominal swelling? No. Pain Score  0 *  Have you tolerated food without any problems? Yes.    Have you been able to return to your normal activities? Yes.    Do you have any questions about your discharge instructions: Diet   No. Medications  No. Follow up visit  No.  Do you have questions or concerns about your Care? No.  Actions: * If pain score is 4 or above: No action needed, pain <4.

## 2016-06-17 NOTE — Telephone Encounter (Signed)
Routing call to endo, as they had tried to call patient.

## 2016-06-21 ENCOUNTER — Telehealth (HOSPITAL_COMMUNITY): Payer: Self-pay | Admitting: Physician Assistant

## 2016-06-22 ENCOUNTER — Other Ambulatory Visit: Payer: Self-pay

## 2016-06-22 MED ORDER — BIS SUBCIT-METRONID-TETRACYC 140-125-125 MG PO CAPS: 3.0000 | ORAL_CAPSULE | Freq: Three times a day (TID) | ORAL | 0 refills | Status: DC

## 2016-06-22 NOTE — Progress Notes (Signed)
Letter mailed

## 2016-06-23 ENCOUNTER — Telehealth: Payer: Self-pay | Admitting: Gastroenterology

## 2016-06-23 ENCOUNTER — Other Ambulatory Visit: Payer: Self-pay

## 2016-06-23 MED ORDER — ONDANSETRON HCL 4 MG PO TABS: 4.0000 mg | ORAL_TABLET | Freq: Three times a day (TID) | ORAL | 1 refills | Status: DC | PRN

## 2016-06-23 MED ORDER — OMEPRAZOLE 20 MG PO CPDR: 40.0000 mg | DELAYED_RELEASE_CAPSULE | Freq: Two times a day (BID) | ORAL | 3 refills | Status: DC

## 2016-06-23 NOTE — Telephone Encounter (Signed)
Sent Zofran and Omeprazole to [facility]. Pt states that he has the Liquid med. He does not know what samples were to be left. I checked up front and there is nothing there with his name. I see no note in regards to samples. Instructed pt to call if he has any concerns.

## 2016-06-23 NOTE — Telephone Encounter (Signed)
06/21/16 Left Message - Called pt and lmsg for him to CB to get scheduled for an echo. ANYONE CAN SCHEDULE    By Verdene Rio           Close PreviousNext

## 2016-06-23 NOTE — Telephone Encounter (Signed)
Left message for pt to return call. It looks like Dr A Rx Zofran prn, Omeprazole 40 bid and Liquid Carafate 10cc q6h. Not sure which medication pt needs and I did not see an encounter that samples were to be given to pt.

## 2016-07-10 ENCOUNTER — Emergency Department (HOSPITAL_COMMUNITY): Payer: Medicaid Other

## 2016-07-10 ENCOUNTER — Encounter (HOSPITAL_COMMUNITY): Payer: Self-pay | Admitting: Emergency Medicine

## 2016-07-10 ENCOUNTER — Emergency Department (HOSPITAL_COMMUNITY)
Admission: EM | Admit: 2016-07-10 | Discharge: 2016-07-10 | Disposition: A | Discharge status: Home / Self Care | Payer: Medicaid Other | Attending: Emergency Medicine | Admitting: Emergency Medicine

## 2016-07-10 DIAGNOSIS — I252 Old myocardial infarction: Secondary | ICD-10-CM | POA: Diagnosis not present

## 2016-07-10 DIAGNOSIS — Z7982 Long term (current) use of aspirin: Secondary | ICD-10-CM | POA: Insufficient documentation

## 2016-07-10 DIAGNOSIS — Z7984 Long term (current) use of oral hypoglycemic drugs: Secondary | ICD-10-CM | POA: Insufficient documentation

## 2016-07-10 DIAGNOSIS — Z8673 Personal history of transient ischemic attack (TIA), and cerebral infarction without residual deficits: Secondary | ICD-10-CM | POA: Insufficient documentation

## 2016-07-10 DIAGNOSIS — R112 Nausea with vomiting, unspecified: Secondary | ICD-10-CM | POA: Diagnosis not present

## 2016-07-10 DIAGNOSIS — Z79899 Other long term (current) drug therapy: Secondary | ICD-10-CM | POA: Insufficient documentation

## 2016-07-10 DIAGNOSIS — R197 Diarrhea, unspecified: Secondary | ICD-10-CM | POA: Insufficient documentation

## 2016-07-10 DIAGNOSIS — E119 Type 2 diabetes mellitus without complications: Secondary | ICD-10-CM | POA: Diagnosis not present

## 2016-07-10 DIAGNOSIS — I10 Essential (primary) hypertension: Secondary | ICD-10-CM | POA: Diagnosis not present

## 2016-07-10 DIAGNOSIS — R1013 Epigastric pain: Secondary | ICD-10-CM | POA: Diagnosis not present

## 2016-07-10 DIAGNOSIS — F17210 Nicotine dependence, cigarettes, uncomplicated: Secondary | ICD-10-CM | POA: Diagnosis not present

## 2016-07-10 LAB — CBC
HEMATOCRIT: 44.9 % (ref 39.0–52.0)
Hemoglobin: 15.9 g/dL (ref 13.0–17.0)
MCH: 24.3 pg — ABNORMAL LOW (ref 26.0–34.0)
MCHC: 35.4 g/dL (ref 30.0–36.0)
MCV: 68.8 fL — AB (ref 78.0–100.0)
PLATELETS: 260 10*3/uL (ref 150–400)
RBC: 6.53 MIL/uL — ABNORMAL HIGH (ref 4.22–5.81)
RDW: 14.6 % (ref 11.5–15.5)
WBC: 7.4 10*3/uL (ref 4.0–10.5)

## 2016-07-10 LAB — URINALYSIS, ROUTINE W REFLEX MICROSCOPIC
BACTERIA UA: NONE SEEN
Bilirubin Urine: NEGATIVE
Glucose, UA: NEGATIVE mg/dL
Ketones, ur: 20 mg/dL — AB
Leukocytes, UA: NEGATIVE
Nitrite: NEGATIVE
PROTEIN: 30 mg/dL — AB
SPECIFIC GRAVITY, URINE: 1.026 (ref 1.005–1.030)
SQUAMOUS EPITHELIAL / LPF: NONE SEEN
pH: 6 (ref 5.0–8.0)

## 2016-07-10 LAB — COMPREHENSIVE METABOLIC PANEL
ALBUMIN: 4.6 g/dL (ref 3.5–5.0)
ALT: 18 U/L (ref 17–63)
AST: 19 U/L (ref 15–41)
Alkaline Phosphatase: 104 U/L (ref 38–126)
Anion gap: 12 (ref 5–15)
BILIRUBIN TOTAL: 0.8 mg/dL (ref 0.3–1.2)
BUN: 20 mg/dL (ref 6–20)
CHLORIDE: 97 mmol/L — AB (ref 101–111)
CO2: 26 mmol/L (ref 22–32)
CREATININE: 1.23 mg/dL (ref 0.61–1.24)
Calcium: 9.6 mg/dL (ref 8.9–10.3)
GFR calc Af Amer: >60 mL/min (ref >60)
GLUCOSE: 120 mg/dL — AB (ref 65–99)
POTASSIUM: 3.8 mmol/L (ref 3.5–5.1)
Sodium: 135 mmol/L (ref 135–145)
Total Protein: 8.7 g/dL — ABNORMAL HIGH (ref 6.5–8.1)

## 2016-07-10 LAB — LIPASE, BLOOD: Lipase: 21 U/L (ref 11–51)

## 2016-07-10 MED ORDER — SODIUM CHLORIDE 0.9 % IV BOLUS (SEPSIS)
1000.0000 mL | Freq: Once | INTRAVENOUS | Status: AC
  Administered 2016-07-10: 1000 mL via INTRAVENOUS

## 2016-07-10 MED ORDER — ONDANSETRON HCL 4 MG/2ML IJ SOLN
4.0000 mg | Freq: Once | INTRAMUSCULAR | Status: AC | PRN
  Administered 2016-07-10: 4 mg via INTRAVENOUS
  Filled 2016-07-10: qty 2

## 2016-07-10 MED ORDER — ONDANSETRON HCL 4 MG/2ML IJ SOLN
4.0000 mg | Freq: Once | INTRAMUSCULAR | Status: DC
  Filled 2016-07-10: qty 2

## 2016-07-10 MED ORDER — MORPHINE SULFATE (PF) 4 MG/ML IV SOLN
4.0000 mg | Freq: Once | INTRAVENOUS | Status: AC
  Administered 2016-07-10: 4 mg via INTRAVENOUS
  Filled 2016-07-10: qty 1

## 2016-07-10 MED ORDER — METOCLOPRAMIDE HCL 10 MG PO TABS: 10.0000 mg | ORAL_TABLET | Freq: Four times a day (QID) | ORAL | 0 refills | Status: DC | PRN

## 2016-07-10 MED ORDER — METOCLOPRAMIDE HCL 5 MG/ML IJ SOLN
10.0000 mg | Freq: Once | INTRAMUSCULAR | Status: AC
  Administered 2016-07-10: 10 mg via INTRAVENOUS
  Filled 2016-07-10: qty 2

## 2016-07-10 MED ORDER — ONDANSETRON HCL 4 MG/2ML IJ SOLN
4.0000 mg | Freq: Once | INTRAMUSCULAR | Status: AC
  Administered 2016-07-10: 4 mg via INTRAVENOUS
  Filled 2016-07-10: qty 2

## 2016-07-10 MED ORDER — PANTOPRAZOLE SODIUM 40 MG IV SOLR
40.0000 mg | Freq: Once | INTRAVENOUS | Status: AC
  Administered 2016-07-10: 40 mg via INTRAVENOUS
  Filled 2016-07-10: qty 40

## 2016-07-10 MED ORDER — GI COCKTAIL ~~LOC~~
30.0000 mL | Freq: Once | ORAL | Status: AC
  Administered 2016-07-10: 30 mL via ORAL
  Filled 2016-07-10: qty 30

## 2016-07-10 MED ORDER — HYDROMORPHONE HCL 1 MG/ML IJ SOLN
1.0000 mg | Freq: Once | INTRAMUSCULAR | Status: AC
  Administered 2016-07-10: 1 mg via INTRAVENOUS
  Filled 2016-07-10: qty 1

## 2016-07-10 NOTE — ED Notes (Signed)
PT DISCHARGED. INSTRUCTIONS AND PRESCRIPTION GIVEN. AAOX4. PT IN NO APPARENT DISTRESS OR PAIN. THE OPPORTUNITY TO ASK QUESTIONS WAS PROVIDED. 
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
## 2016-07-10 NOTE — Discharge Instructions (Signed)
Read the information below.  Use the prescribed medication as directed.  Please discuss all new medications with your pharmacist.  You may return to the Emergency Department at any time for worsening condition or any new symptoms that concern you.   If you develop high fevers, worsening abdominal pain, uncontrolled vomiting, or are unable to tolerate fluids by mouth, return to the ER for a recheck.  ° °

## 2016-07-10 NOTE — ED Notes (Signed)
Patient transported to X-ray 

## 2016-07-10 NOTE — ED Triage Notes (Signed)
Pt c/o emesis onset of Friday. Pt c/o pain 5/10 in left upper abdomen.

## 2016-07-11 ENCOUNTER — Other Ambulatory Visit: Payer: Self-pay

## 2016-07-11 MED ORDER — PROMETHAZINE HCL 25 MG PO TABS: 25.0000 mg | ORAL_TABLET | Freq: Four times a day (QID) | ORAL | 0 refills | Status: DC | PRN

## 2016-07-15 ENCOUNTER — Ambulatory Visit (INDEPENDENT_AMBULATORY_CARE_PROVIDER_SITE_OTHER): Payer: Medicaid Other | Admitting: Physician Assistant

## 2016-07-15 ENCOUNTER — Encounter: Payer: Self-pay | Admitting: Physician Assistant

## 2016-07-15 VITALS — BP 138/80 | HR 70 | Ht 69.0 in | Wt 161.0 lb

## 2016-07-15 DIAGNOSIS — R109 Unspecified abdominal pain: Secondary | ICD-10-CM | POA: Diagnosis not present

## 2016-07-15 DIAGNOSIS — R112 Nausea with vomiting, unspecified: Secondary | ICD-10-CM

## 2016-07-15 DIAGNOSIS — A048 Other specified bacterial intestinal infections: Secondary | ICD-10-CM

## 2016-07-15 DIAGNOSIS — Z1211 Encounter for screening for malignant neoplasm of colon: Secondary | ICD-10-CM | POA: Diagnosis not present

## 2016-07-15 MED ORDER — NA SULFATE-K SULFATE-MG SULF 17.5-3.13-1.6 GM/177ML PO SOLN: 1.0000 | Freq: Once | ORAL | 0 refills | Status: AC
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
## 2016-07-15 NOTE — Progress Notes (Signed)
Agree with assessment and plan as outlined.  

## 2016-07-15 NOTE — Patient Instructions (Signed)
Your physician has requested that you go to the basement for the following lab work before leaving today:  H Pylori  Complete this test 3-5 weeks from now.   You have been scheduled for a colonoscopy. Please follow written instructions given to you at your visit today.  Please pick up your prep supplies at the pharmacy within the next 1-3 days. If you use inhalers (even only as needed), please bring them with you on the day of your procedure.   Use Miralax twice a day for 3 days prior to the procedure

## 2016-07-25 ENCOUNTER — Telehealth (HOSPITAL_COMMUNITY): Payer: Self-pay | Admitting: *Deleted

## 2016-07-25 NOTE — Telephone Encounter (Signed)
Attempted to contact patient for instructions. The only # listed in Epic is no longer a working #,changed or disconnected.Troy Mclaughlin, Troy Mclaughlin

## 2016-07-27 ENCOUNTER — Encounter (HOSPITAL_COMMUNITY): Payer: Self-pay

## 2016-07-27 ENCOUNTER — Telehealth (HOSPITAL_COMMUNITY): Payer: Self-pay | Admitting: *Deleted

## 2016-07-27 NOTE — Telephone Encounter (Signed)
Patient is a no show-Attempted to call patient to reschedule appointment- no answer.

## 2016-08-03 ENCOUNTER — Ambulatory Visit: Payer: Self-pay | Admitting: Gastroenterology

## 2016-08-03 ENCOUNTER — Ambulatory Visit (HOSPITAL_COMMUNITY): Payer: Self-pay

## 2016-08-04 ENCOUNTER — Ambulatory Visit (HOSPITAL_COMMUNITY): Payer: Self-pay

## 2016-08-08 ENCOUNTER — Encounter (HOSPITAL_COMMUNITY): Payer: Self-pay

## 2016-08-08 ENCOUNTER — Encounter: Payer: Medicaid Other | Admitting: Gastroenterology

## 2016-08-08 NOTE — Telephone Encounter (Signed)
Okay. Not sure what happened, he can reschedule at his convenience. I know [facility] has called multiple times today without being able to get ahold of him

## 2016-08-16 ENCOUNTER — Encounter (HOSPITAL_COMMUNITY): Payer: Self-pay | Admitting: Family Medicine

## 2016-08-16 ENCOUNTER — Inpatient Hospital Stay (HOSPITAL_COMMUNITY)
Admission: EM | Admit: 2016-08-16 | Discharge: 2016-08-18 | DRG: 694 | Disposition: A | Discharge status: Home / Self Care | Payer: Medicaid Other | Attending: Internal Medicine | Admitting: Internal Medicine

## 2016-08-16 ENCOUNTER — Emergency Department (HOSPITAL_COMMUNITY): Payer: Medicaid Other

## 2016-08-16 DIAGNOSIS — R112 Nausea with vomiting, unspecified: Secondary | ICD-10-CM | POA: Diagnosis not present

## 2016-08-16 DIAGNOSIS — K529 Noninfective gastroenteritis and colitis, unspecified: Secondary | ICD-10-CM | POA: Diagnosis present

## 2016-08-16 DIAGNOSIS — E860 Dehydration: Secondary | ICD-10-CM | POA: Diagnosis not present

## 2016-08-16 DIAGNOSIS — Z8673 Personal history of transient ischemic attack (TIA), and cerebral infarction without residual deficits: Secondary | ICD-10-CM

## 2016-08-16 DIAGNOSIS — I252 Old myocardial infarction: Secondary | ICD-10-CM

## 2016-08-16 DIAGNOSIS — E119 Type 2 diabetes mellitus without complications: Secondary | ICD-10-CM

## 2016-08-16 DIAGNOSIS — N200 Calculus of kidney: Principal | ICD-10-CM

## 2016-08-16 DIAGNOSIS — Z79899 Other long term (current) drug therapy: Secondary | ICD-10-CM

## 2016-08-16 DIAGNOSIS — Z823 Family history of stroke: Secondary | ICD-10-CM

## 2016-08-16 DIAGNOSIS — K5900 Constipation, unspecified: Secondary | ICD-10-CM | POA: Diagnosis present

## 2016-08-16 DIAGNOSIS — H409 Unspecified glaucoma: Secondary | ICD-10-CM | POA: Diagnosis present

## 2016-08-16 DIAGNOSIS — I10 Essential (primary) hypertension: Secondary | ICD-10-CM | POA: Diagnosis not present

## 2016-08-16 DIAGNOSIS — I2510 Atherosclerotic heart disease of native coronary artery without angina pectoris: Secondary | ICD-10-CM | POA: Diagnosis present

## 2016-08-16 DIAGNOSIS — Z7141 Alcohol abuse counseling and surveillance of alcoholic: Secondary | ICD-10-CM

## 2016-08-16 DIAGNOSIS — Z7982 Long term (current) use of aspirin: Secondary | ICD-10-CM

## 2016-08-16 DIAGNOSIS — K219 Gastro-esophageal reflux disease without esophagitis: Secondary | ICD-10-CM | POA: Diagnosis present

## 2016-08-16 DIAGNOSIS — R109 Unspecified abdominal pain: Secondary | ICD-10-CM | POA: Diagnosis present

## 2016-08-16 DIAGNOSIS — F1020 Alcohol dependence, uncomplicated: Secondary | ICD-10-CM | POA: Diagnosis present

## 2016-08-16 DIAGNOSIS — F17210 Nicotine dependence, cigarettes, uncomplicated: Secondary | ICD-10-CM | POA: Diagnosis present

## 2016-08-16 DIAGNOSIS — Z7984 Long term (current) use of oral hypoglycemic drugs: Secondary | ICD-10-CM

## 2016-08-16 DIAGNOSIS — Z888 Allergy status to other drugs, medicaments and biological substances status: Secondary | ICD-10-CM

## 2016-08-16 DIAGNOSIS — Z8249 Family history of ischemic heart disease and other diseases of the circulatory system: Secondary | ICD-10-CM

## 2016-08-16 LAB — LIPASE, BLOOD: LIPASE: 15 U/L (ref 11–51)

## 2016-08-16 LAB — URINALYSIS, ROUTINE W REFLEX MICROSCOPIC
BACTERIA UA: NONE SEEN
Bilirubin Urine: NEGATIVE
Glucose, UA: 50 mg/dL — AB
Hgb urine dipstick: NEGATIVE
KETONES UR: 20 mg/dL — AB
Leukocytes, UA: NEGATIVE
Nitrite: NEGATIVE
PH: 6 (ref 5.0–8.0)
PROTEIN: 30 mg/dL — AB
Specific Gravity, Urine: >1.046 — ABNORMAL HIGH (ref 1.005–1.030)

## 2016-08-16 LAB — CBC
HEMATOCRIT: 46.6 % (ref 39.0–52.0)
Hemoglobin: 16.4 g/dL (ref 13.0–17.0)
MCH: 24.5 pg — AB (ref 26.0–34.0)
MCHC: 35.2 g/dL (ref 30.0–36.0)
MCV: 69.7 fL — AB (ref 78.0–100.0)
PLATELETS: 339 10*3/uL (ref 150–400)
RBC: 6.69 MIL/uL — AB (ref 4.22–5.81)
RDW: 15.4 % (ref 11.5–15.5)
WBC: 7.7 10*3/uL (ref 4.0–10.5)

## 2016-08-16 LAB — COMPREHENSIVE METABOLIC PANEL
ALT: 18 U/L (ref 17–63)
AST: 18 U/L (ref 15–41)
Albumin: 4.7 g/dL (ref 3.5–5.0)
Alkaline Phosphatase: 117 U/L (ref 38–126)
Anion gap: 12 (ref 5–15)
BUN: 17 mg/dL (ref 6–20)
CHLORIDE: 97 mmol/L — AB (ref 101–111)
CO2: 26 mmol/L (ref 22–32)
CREATININE: 1.16 mg/dL (ref 0.61–1.24)
Calcium: 10.3 mg/dL (ref 8.9–10.3)
GFR calc non Af Amer: >60 mL/min (ref >60)
Glucose, Bld: 115 mg/dL — ABNORMAL HIGH (ref 65–99)
Potassium: 4.3 mmol/L (ref 3.5–5.1)
SODIUM: 135 mmol/L (ref 135–145)
Total Bilirubin: 1 mg/dL (ref 0.3–1.2)
Total Protein: 9.2 g/dL — ABNORMAL HIGH (ref 6.5–8.1)

## 2016-08-16 LAB — GLUCOSE, CAPILLARY: Glucose-Capillary: 106 mg/dL — ABNORMAL HIGH (ref 65–99)

## 2016-08-16 LAB — I-STAT CG4 LACTIC ACID, ED: LACTIC ACID, VENOUS: 1.93 mmol/L — AB (ref 0.5–1.9)

## 2016-08-16 LAB — I-STAT TROPONIN, ED: TROPONIN I, POC: 0 ng/mL (ref 0.00–0.08)

## 2016-08-16 MED ORDER — THIAMINE HCL 100 MG/ML IJ SOLN: 100.0000 mg | Freq: Every day | INTRAMUSCULAR | Status: DC

## 2016-08-16 MED ORDER — IOPAMIDOL (ISOVUE-300) INJECTION 61%
INTRAVENOUS | Status: AC
  Filled 2016-08-16: qty 100

## 2016-08-16 MED ORDER — PANTOPRAZOLE SODIUM 40 MG PO TBEC: 40.0000 mg | DELAYED_RELEASE_TABLET | Freq: Every day | ORAL | Status: DC

## 2016-08-16 MED ORDER — ONDANSETRON HCL 4 MG PO TABS
4.0000 mg | ORAL_TABLET | Freq: Four times a day (QID) | ORAL | Status: DC | PRN
Start: 2016-08-16 — End: 2016-08-18
  Administered 2016-08-17 – 2016-08-18 (×2): 4 mg via ORAL
  Filled 2016-08-16 (×2): qty 1

## 2016-08-16 MED ORDER — POLYETHYLENE GLYCOL 3350 17 G PO PACK: 17.0000 g | PACK | Freq: Every day | ORAL | Status: DC | PRN

## 2016-08-16 MED ORDER — LABETALOL HCL 5 MG/ML IV SOLN
10.0000 mg | Freq: Four times a day (QID) | INTRAVENOUS | Status: AC | PRN
  Administered 2016-08-16 – 2016-08-17 (×2): 10 mg via INTRAVENOUS
  Filled 2016-08-16 (×4): qty 4

## 2016-08-16 MED ORDER — HYDROMORPHONE HCL 1 MG/ML IJ SOLN
1.0000 mg | INTRAMUSCULAR | Status: AC | PRN
  Administered 2016-08-16 – 2016-08-17 (×2): 1 mg via INTRAVENOUS
  Filled 2016-08-16 (×2): qty 1

## 2016-08-16 MED ORDER — ONDANSETRON HCL 4 MG/2ML IJ SOLN
4.0000 mg | Freq: Once | INTRAMUSCULAR | Status: AC
  Administered 2016-08-16: 4 mg via INTRAVENOUS
  Filled 2016-08-16: qty 2

## 2016-08-16 MED ORDER — VITAMIN B-1 100 MG PO TABS
100.0000 mg | ORAL_TABLET | Freq: Every day | ORAL | Status: DC
  Administered 2016-08-17 – 2016-08-18 (×2): 100 mg via ORAL
  Filled 2016-08-16 (×2): qty 1

## 2016-08-16 MED ORDER — INSULIN ASPART 100 UNIT/ML ~~LOC~~ SOLN
0.0000 [IU] | Freq: Every day | SUBCUTANEOUS | Status: DC
  Administered 2016-08-16: 0 [IU] via SUBCUTANEOUS

## 2016-08-16 MED ORDER — LORAZEPAM 1 MG PO TABS
1.0000 mg | ORAL_TABLET | Freq: Four times a day (QID) | ORAL | Status: DC | PRN
  Administered 2016-08-17 – 2016-08-18 (×4): 1 mg via ORAL
  Filled 2016-08-16 (×4): qty 1

## 2016-08-16 MED ORDER — SODIUM CHLORIDE 0.9 % IV SOLN
INTRAVENOUS | Status: DC
Start: 2016-08-16 — End: 2016-08-18
  Administered 2016-08-16 – 2016-08-17 (×4): via INTRAVENOUS

## 2016-08-16 MED ORDER — FOLIC ACID 1 MG PO TABS
1.0000 mg | ORAL_TABLET | Freq: Every day | ORAL | Status: DC
  Administered 2016-08-17 – 2016-08-18 (×2): 1 mg via ORAL
  Filled 2016-08-16 (×2): qty 1

## 2016-08-16 MED ORDER — ADULT MULTIVITAMIN W/MINERALS CH
1.0000 | ORAL_TABLET | Freq: Every day | ORAL | Status: DC
  Administered 2016-08-17 – 2016-08-18 (×2): 1 via ORAL
  Filled 2016-08-16 (×2): qty 1

## 2016-08-16 MED ORDER — SODIUM CHLORIDE 0.9 % IV BOLUS (SEPSIS)
1000.0000 mL | Freq: Once | INTRAVENOUS | Status: AC
  Administered 2016-08-16: 1000 mL via INTRAVENOUS

## 2016-08-16 MED ORDER — ACETAMINOPHEN 325 MG PO TABS
650.0000 mg | ORAL_TABLET | Freq: Four times a day (QID) | ORAL | Status: DC | PRN
  Administered 2016-08-17 (×2): 650 mg via ORAL
  Filled 2016-08-16 (×3): qty 2

## 2016-08-16 MED ORDER — CARVEDILOL 6.25 MG PO TABS
6.1250 mg | ORAL_TABLET | Freq: Two times a day (BID) | ORAL | Status: DC
  Administered 2016-08-17 – 2016-08-18 (×3): 6.25 mg via ORAL
  Filled 2016-08-16 (×3): qty 1

## 2016-08-16 MED ORDER — INSULIN ASPART 100 UNIT/ML ~~LOC~~ SOLN
0.0000 [IU] | Freq: Three times a day (TID) | SUBCUTANEOUS | Status: DC
  Administered 2016-08-17: 1 [IU] via SUBCUTANEOUS

## 2016-08-16 MED ORDER — METOCLOPRAMIDE HCL 5 MG/ML IJ SOLN
10.0000 mg | Freq: Once | INTRAMUSCULAR | Status: AC
  Administered 2016-08-16: 10 mg via INTRAVENOUS
  Filled 2016-08-16: qty 2

## 2016-08-16 MED ORDER — ATORVASTATIN CALCIUM 40 MG PO TABS
40.0000 mg | ORAL_TABLET | Freq: Every day | ORAL | Status: DC
  Administered 2016-08-17 – 2016-08-18 (×2): 40 mg via ORAL
  Filled 2016-08-16 (×2): qty 1

## 2016-08-16 MED ORDER — ONDANSETRON 4 MG PO TBDP
4.0000 mg | ORAL_TABLET | Freq: Once | ORAL | Status: AC | PRN
  Administered 2016-08-16: 4 mg via ORAL
  Filled 2016-08-16: qty 1

## 2016-08-16 MED ORDER — LORAZEPAM 2 MG/ML IJ SOLN
1.0000 mg | Freq: Four times a day (QID) | INTRAMUSCULAR | Status: DC | PRN
  Administered 2016-08-18: 1 mg via INTRAVENOUS
  Filled 2016-08-16: qty 1

## 2016-08-16 MED ORDER — ASPIRIN EC 81 MG PO TBEC
81.0000 mg | DELAYED_RELEASE_TABLET | Freq: Every day | ORAL | Status: DC
  Administered 2016-08-17 – 2016-08-18 (×2): 81 mg via ORAL
  Filled 2016-08-16 (×2): qty 1

## 2016-08-16 MED ORDER — DIPHENHYDRAMINE HCL 50 MG/ML IJ SOLN
12.5000 mg | Freq: Once | INTRAMUSCULAR | Status: AC
Start: 2016-08-16 — End: 2016-08-16
  Administered 2016-08-16: 12.5 mg via INTRAVENOUS
  Filled 2016-08-16: qty 1

## 2016-08-16 MED ORDER — ENOXAPARIN SODIUM 40 MG/0.4ML ~~LOC~~ SOLN
40.0000 mg | Freq: Every day | SUBCUTANEOUS | Status: DC
  Administered 2016-08-16 – 2016-08-17 (×2): 40 mg via SUBCUTANEOUS
  Filled 2016-08-16 (×2): qty 0.4

## 2016-08-16 MED ORDER — FAMOTIDINE 20 MG PO TABS
20.0000 mg | ORAL_TABLET | Freq: Two times a day (BID) | ORAL | Status: DC
  Administered 2016-08-16 – 2016-08-18 (×4): 20 mg via ORAL
  Filled 2016-08-16 (×4): qty 1

## 2016-08-16 MED ORDER — HYDROMORPHONE HCL 1 MG/ML IJ SOLN
1.0000 mg | Freq: Once | INTRAMUSCULAR | Status: AC
  Administered 2016-08-16: 1 mg via INTRAVENOUS
  Filled 2016-08-16: qty 1

## 2016-08-16 MED ORDER — HYDRALAZINE HCL 20 MG/ML IJ SOLN
10.0000 mg | Freq: Once | INTRAMUSCULAR | Status: AC
  Administered 2016-08-16: 10 mg via INTRAVENOUS
  Filled 2016-08-16: qty 1

## 2016-08-16 MED ORDER — IOPAMIDOL (ISOVUE-300) INJECTION 61%
100.0000 mL | Freq: Once | INTRAVENOUS | Status: AC | PRN
  Administered 2016-08-16: 100 mL via INTRAVENOUS

## 2016-08-16 MED ORDER — ONDANSETRON HCL 4 MG/2ML IJ SOLN
4.0000 mg | Freq: Four times a day (QID) | INTRAMUSCULAR | Status: DC | PRN
  Administered 2016-08-16 – 2016-08-18 (×4): 4 mg via INTRAVENOUS
  Filled 2016-08-16 (×4): qty 2

## 2016-08-16 MED ORDER — ACETAMINOPHEN 650 MG RE SUPP: 650.0000 mg | Freq: Four times a day (QID) | RECTAL | Status: DC | PRN

## 2016-08-16 MED ORDER — AMLODIPINE BESYLATE 10 MG PO TABS
10.0000 mg | ORAL_TABLET | Freq: Every day | ORAL | Status: DC
  Administered 2016-08-17 – 2016-08-18 (×2): 10 mg via ORAL
  Filled 2016-08-16 (×2): qty 1

## 2016-08-16 NOTE — ED Notes (Signed)
Pt aware UA needed. Unable to provide at this time. Pt given urinal-will check back.
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
## 2016-08-16 NOTE — ED Notes (Signed)
Patient transported to CT 

## 2016-08-16 NOTE — ED Notes (Signed)
Pt has had x2 emesis occurences in the presence of this Probation officer. Emesis is thin yellow. Pt currently still nauseous.

## 2016-08-16 NOTE — ED Notes (Signed)
Ellender Hose, MD notified of pts hypertension.

## 2016-08-16 NOTE — ED Triage Notes (Addendum)
Pt states that he has had multiple abd surgeries and has been constipated and nauseated since Saturday. Pt also endorses chest pain this morning that he took one of his nitroglycerin pills for. Alert and oriented.
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
## 2016-08-17 ENCOUNTER — Telehealth (HOSPITAL_COMMUNITY): Payer: Self-pay | Admitting: Physician Assistant

## 2016-08-17 DIAGNOSIS — I252 Old myocardial infarction: Secondary | ICD-10-CM | POA: Diagnosis not present

## 2016-08-17 DIAGNOSIS — R1110 Vomiting, unspecified: Secondary | ICD-10-CM | POA: Diagnosis not present

## 2016-08-17 DIAGNOSIS — E860 Dehydration: Secondary | ICD-10-CM | POA: Diagnosis present

## 2016-08-17 DIAGNOSIS — F17210 Nicotine dependence, cigarettes, uncomplicated: Secondary | ICD-10-CM | POA: Diagnosis present

## 2016-08-17 DIAGNOSIS — Z888 Allergy status to other drugs, medicaments and biological substances status: Secondary | ICD-10-CM | POA: Diagnosis not present

## 2016-08-17 DIAGNOSIS — Z79899 Other long term (current) drug therapy: Secondary | ICD-10-CM | POA: Diagnosis not present

## 2016-08-17 DIAGNOSIS — Z7141 Alcohol abuse counseling and surveillance of alcoholic: Secondary | ICD-10-CM | POA: Diagnosis not present

## 2016-08-17 DIAGNOSIS — K5900 Constipation, unspecified: Secondary | ICD-10-CM | POA: Diagnosis present

## 2016-08-17 DIAGNOSIS — H409 Unspecified glaucoma: Secondary | ICD-10-CM | POA: Diagnosis present

## 2016-08-17 DIAGNOSIS — I10 Essential (primary) hypertension: Secondary | ICD-10-CM | POA: Diagnosis not present

## 2016-08-17 DIAGNOSIS — N200 Calculus of kidney: Secondary | ICD-10-CM | POA: Diagnosis not present

## 2016-08-17 DIAGNOSIS — Z7984 Long term (current) use of oral hypoglycemic drugs: Secondary | ICD-10-CM | POA: Diagnosis not present

## 2016-08-17 DIAGNOSIS — Z7982 Long term (current) use of aspirin: Secondary | ICD-10-CM | POA: Diagnosis not present

## 2016-08-17 DIAGNOSIS — F1020 Alcohol dependence, uncomplicated: Secondary | ICD-10-CM | POA: Diagnosis present

## 2016-08-17 DIAGNOSIS — Z823 Family history of stroke: Secondary | ICD-10-CM | POA: Diagnosis not present

## 2016-08-17 DIAGNOSIS — Z8673 Personal history of transient ischemic attack (TIA), and cerebral infarction without residual deficits: Secondary | ICD-10-CM | POA: Diagnosis not present

## 2016-08-17 DIAGNOSIS — Z8249 Family history of ischemic heart disease and other diseases of the circulatory system: Secondary | ICD-10-CM | POA: Diagnosis not present

## 2016-08-17 DIAGNOSIS — E119 Type 2 diabetes mellitus without complications: Secondary | ICD-10-CM | POA: Diagnosis not present

## 2016-08-17 DIAGNOSIS — I2510 Atherosclerotic heart disease of native coronary artery without angina pectoris: Secondary | ICD-10-CM | POA: Diagnosis not present

## 2016-08-17 DIAGNOSIS — K219 Gastro-esophageal reflux disease without esophagitis: Secondary | ICD-10-CM | POA: Diagnosis present

## 2016-08-17 DIAGNOSIS — K529 Noninfective gastroenteritis and colitis, unspecified: Secondary | ICD-10-CM | POA: Diagnosis present

## 2016-08-17 DIAGNOSIS — R112 Nausea with vomiting, unspecified: Secondary | ICD-10-CM | POA: Diagnosis not present

## 2016-08-17 LAB — GLUCOSE, CAPILLARY
GLUCOSE-CAPILLARY: 94 mg/dL (ref 65–99)
GLUCOSE-CAPILLARY: 126 mg/dL — AB (ref 65–99)
Glucose-Capillary: 95 mg/dL (ref 65–99)

## 2016-08-17 LAB — RAPID URINE DRUG SCREEN, HOSP PERFORMED
AMPHETAMINES: NOT DETECTED
BARBITURATES: NOT DETECTED
Benzodiazepines: NOT DETECTED
Cocaine: NOT DETECTED
Opiates: NOT DETECTED
Tetrahydrocannabinol: NOT DETECTED

## 2016-08-17 LAB — COMPREHENSIVE METABOLIC PANEL
ALBUMIN: 3.8 g/dL (ref 3.5–5.0)
ALT: 14 U/L — ABNORMAL LOW (ref 17–63)
AST: 14 U/L — ABNORMAL LOW (ref 15–41)
Alkaline Phosphatase: 89 U/L (ref 38–126)
Anion gap: 7 (ref 5–15)
BUN: 14 mg/dL (ref 6–20)
CHLORIDE: 105 mmol/L (ref 101–111)
CO2: 25 mmol/L (ref 22–32)
Calcium: 8.7 mg/dL — ABNORMAL LOW (ref 8.9–10.3)
Creatinine, Ser: 0.84 mg/dL (ref 0.61–1.24)
GFR calc Af Amer: >60 mL/min (ref >60)
GFR calc non Af Amer: >60 mL/min (ref >60)
GLUCOSE: 93 mg/dL (ref 65–99)
POTASSIUM: 3.7 mmol/L (ref 3.5–5.1)
Sodium: 137 mmol/L (ref 135–145)
Total Bilirubin: 0.9 mg/dL (ref 0.3–1.2)
Total Protein: 7 g/dL (ref 6.5–8.1)

## 2016-08-17 LAB — CBC
HEMATOCRIT: 39.8 % (ref 39.0–52.0)
Hemoglobin: 13.5 g/dL (ref 13.0–17.0)
MCH: 23.7 pg — ABNORMAL LOW (ref 26.0–34.0)
MCHC: 33.9 g/dL (ref 30.0–36.0)
MCV: 69.9 fL — AB (ref 78.0–100.0)
Platelets: 283 10*3/uL (ref 150–400)
RBC: 5.69 MIL/uL (ref 4.22–5.81)
RDW: 15.5 % (ref 11.5–15.5)
WBC: 6.5 10*3/uL (ref 4.0–10.5)

## 2016-08-17 LAB — HIV ANTIBODY (ROUTINE TESTING W REFLEX): HIV SCREEN 4TH GENERATION: NONREACTIVE

## 2016-08-17 MED ORDER — PANTOPRAZOLE SODIUM 40 MG PO TBEC
40.0000 mg | DELAYED_RELEASE_TABLET | Freq: Every day | ORAL | Status: DC
  Administered 2016-08-17 – 2016-08-18 (×2): 40 mg via ORAL
  Filled 2016-08-17 (×2): qty 1

## 2016-08-17 MED ORDER — TRAMADOL HCL 50 MG PO TABS
50.0000 mg | ORAL_TABLET | Freq: Once | ORAL | Status: AC
  Administered 2016-08-17: 50 mg via ORAL
  Filled 2016-08-17: qty 1

## 2016-08-17 MED ORDER — TAMSULOSIN HCL 0.4 MG PO CAPS
0.4000 mg | ORAL_CAPSULE | Freq: Every day | ORAL | Status: DC
  Administered 2016-08-17 – 2016-08-18 (×2): 0.4 mg via ORAL
  Filled 2016-08-17 (×2): qty 1

## 2016-08-17 MED ORDER — OXYCODONE HCL 5 MG PO TABS
5.0000 mg | ORAL_TABLET | Freq: Four times a day (QID) | ORAL | Status: DC | PRN
  Administered 2016-08-17 – 2016-08-18 (×5): 5 mg via ORAL
  Filled 2016-08-17 (×5): qty 1
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
## 2016-08-17 NOTE — [facility] Notes (Signed)
It is my clinical opinion that admission to INPATIENT is reasonable and necessary in this 61 y.o. male . presenting with symptoms of Nausea, vomiting and abd pain concerning for Renal Stone. He is unable to tolerate po diet at this time.  . in the context of PMH including: HTN, MI, DM2 . with pertinent positives on physical exam including: Unremarkable . and pertinent positives on radiographic and laboratory data including: Nonobstructive renal stone on CT A/P . Workup and treatment include IVF, antiemetics, flomax and pain control    Given the aforementioned, the predictability of an adverse outcome is felt to be significant. I expect that the patient will require at least 2 midnights in the hospital to treat this condition.
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
## 2016-08-18 ENCOUNTER — Inpatient Hospital Stay (HOSPITAL_COMMUNITY): Payer: Medicaid Other

## 2016-08-18 DIAGNOSIS — N200 Calculus of kidney: Secondary | ICD-10-CM

## 2016-08-18 LAB — BASIC METABOLIC PANEL
ANION GAP: 8 (ref 5–15)
BUN: 8 mg/dL (ref 6–20)
CALCIUM: 8.8 mg/dL — AB (ref 8.9–10.3)
CO2: 23 mmol/L (ref 22–32)
Chloride: 100 mmol/L — ABNORMAL LOW (ref 101–111)
Creatinine, Ser: 0.73 mg/dL (ref 0.61–1.24)
GFR calc Af Amer: >60 mL/min (ref >60)
GFR calc non Af Amer: >60 mL/min (ref >60)
GLUCOSE: 88 mg/dL (ref 65–99)
Potassium: 3.6 mmol/L (ref 3.5–5.1)
Sodium: 131 mmol/L — ABNORMAL LOW (ref 135–145)

## 2016-08-18 LAB — CBC
HEMATOCRIT: 39.5 % (ref 39.0–52.0)
Hemoglobin: 13.5 g/dL (ref 13.0–17.0)
MCH: 24 pg — ABNORMAL LOW (ref 26.0–34.0)
MCHC: 34.2 g/dL (ref 30.0–36.0)
MCV: 70.2 fL — AB (ref 78.0–100.0)
Platelets: 278 10*3/uL (ref 150–400)
RBC: 5.63 MIL/uL (ref 4.22–5.81)
RDW: 15.2 % (ref 11.5–15.5)
WBC: 5.3 10*3/uL (ref 4.0–10.5)

## 2016-08-18 LAB — GLUCOSE, CAPILLARY
GLUCOSE-CAPILLARY: 83 mg/dL (ref 65–99)
GLUCOSE-CAPILLARY: 90 mg/dL (ref 65–99)
Glucose-Capillary: 106 mg/dL — ABNORMAL HIGH (ref 65–99)

## 2016-08-18 MED ORDER — OXYCODONE HCL 5 MG PO TABS: 5.0000 mg | ORAL_TABLET | Freq: Four times a day (QID) | ORAL | 0 refills | Status: DC | PRN

## 2016-08-18 MED ORDER — ONDANSETRON 8 MG PO TBDP: 8.0000 mg | ORAL_TABLET | Freq: Three times a day (TID) | ORAL | 0 refills | Status: DC | PRN

## 2016-08-18 MED ORDER — THIAMINE HCL 100 MG PO TABS: 100.0000 mg | ORAL_TABLET | Freq: Every day | ORAL | 0 refills | Status: DC

## 2016-08-18 MED ORDER — ENSURE ENLIVE PO LIQD: 237.0000 mL | Freq: Two times a day (BID) | ORAL | Status: DC

## 2016-08-18 MED ORDER — LABETALOL HCL 5 MG/ML IV SOLN
10.0000 mg | Freq: Once | INTRAVENOUS | Status: AC
  Administered 2016-08-18: 10 mg via INTRAVENOUS
  Filled 2016-08-18: qty 4

## 2016-08-18 MED ORDER — ADULT MULTIVITAMIN W/MINERALS CH: 1.0000 | ORAL_TABLET | Freq: Every day | ORAL | 0 refills | Status: DC

## 2016-08-18 MED ORDER — FOLIC ACID 1 MG PO TABS: 1.0000 mg | ORAL_TABLET | Freq: Every day | ORAL | 1 refills | Status: DC

## 2016-08-18 MED ORDER — TAMSULOSIN HCL 0.4 MG PO CAPS: 0.4000 mg | ORAL_CAPSULE | Freq: Every day | ORAL | 0 refills | Status: DC
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
## 2016-08-18 NOTE — Progress Notes (Signed)
Pt discharged home via cab in stable condition. Discharge instructions and scripts given.  Pt verbalized understanding. No immediate questions or concerns.

## 2016-08-18 NOTE — Discharge Summary (Signed)
Physician Discharge Summary  Troy Mclaughlin KJZ:[id number] DOB: 1956/04/02 DOA: 08/16/2016  PCP: [name], MD  Admit date: 08/16/2016 Discharge date: 08/18/2016  Admitted From: Home Disposition: Home  Recommendations for Outpatient Follow-up:  1. Follow up with PCP in 1 weeks 2. Take Flomax daily for next week  3. Pain control, Prescription for Oxycodone given. To be taken as prescribed only for severe pain  4. Drink Plenty of fluids.  5. Prescription for zofran ODT PO given.   Home Health: No Equipment/Devices: none  Discharge Condition: stable CODE STATUS: full Diet recommendation: Heart Healthy   Brief/Interim Summary: 61 year old male with past medical history of hypertension, myocardial infarction, diabetes, and show 1 to the abdomen came to the ED with complaints of nausea and vomiting for 2 days prior to the day of admission. Patient stated most of the pain is on the left side and is crampy in nature. Denies any fevers, chills, hematuria, diarrhea, chest pain, shortness of breath and other complaints. At the time of admission his heart rate was noted to be elevated with UA look concentrated without any pyuria. CT of the abdomen pelvis done was unremarkable and did not show any pancreatitis, small bowel obstruction, appendicitis or any other acute inflammation. CT of the abdomen and pelvis did show a 4 mm nonobstructing left-sided renal stone. He was aggressively given IV fluids, started on Flomax and pain control. His nausea and vomiting persisted over a couple of days and improved a little bit on the day of the discharge. Repeat abdominal x-ray was performed with the of discharge and still showed 4 mm renal stone. Today he is reached maximum benefit from inpatient hospital stay and is asked to follow with primary care physician within the next week. He will be discharged on Flomax, and Zofran oral dissolving tablets, and oxycodone for pain control. I also advised him to keep  himself well-hydrated. If he notices blood in his urine or start having fevers chills and acutely sick he needs to call his PCP or return to the ED.    Discharge Diagnoses:  Principal Problem:   Nausea & vomiting Active Problems:   Gastroenteritis   Hypertension, uncontrolled   Coronary artery disease, non-occlusive   Abdominal pain   Type 2 diabetes mellitus without complication, without long-term current use of insulin (HCC)  Left  nonobstructive renal stone -Encourage PO Fluid intake -Advance diet as tolerated -Pain control, Flomax. -Supportive care -Repeat KUB shows 77mm renal stone on the left side.   Hypertension -Continue amlodipine and Coreg  Coronary artery disease -Continue aspirin, statin and Coreg  Diabetes type 2 restart home metformin.  History of alcohol use -counseled on alcohol cessation.   Discharge Instructions   Allergies as of 08/18/2016      Reactions   Lisinopril Swelling   angioedema      Medication List    TAKE these medications   acetaminophen 325 MG tablet Commonly known as:  TYLENOL Take 325 mg by mouth every 6 (six) hours as needed (pain).   acetaminophen-codeine 300-30 MG tablet Commonly known as:  TYLENOL #3 Take 1 tablet by mouth every 4 (four) hours as needed for moderate pain or severe pain.   amLODipine 10 MG tablet Commonly known as:  NORVASC TAKE 1 TABLET(10 MG) BY MOUTH DAILY What changed:  how much to take  how to take this  when to take this  additional instructions   aspirin 81 MG EC tablet Take 1 tablet (81 mg total) by  mouth daily.   atorvastatin 40 MG tablet Commonly known as:  LIPITOR Take 1 tablet (40 mg total) by mouth daily.   bismuth-metronidazole-tetracycline 140-125-125 MG capsule Commonly known as:  PYLERA Take 3 capsules by mouth 4 (four) times daily -  before meals and at bedtime.   carvedilol 6.25 MG tablet Commonly known as:  COREG Take 1 tablet (6.25 mg total) by mouth 2 (two)  times daily with a meal.   famotidine 20 MG tablet Commonly known as:  PEPCID Take 1 tablet (20 mg total) by mouth 2 (two) times daily.   folic acid 1 MG tablet Commonly known as:  FOLVITE Take 1 tablet (1 mg total) by mouth daily. Start taking on:  08/19/2016   metFORMIN 500 MG 24 hr tablet Commonly known as:  GLUCOPHAGE XR Take 1 tablet (500 mg total) by mouth daily with breakfast.   multivitamin with minerals Tabs tablet Take 1 tablet by mouth daily. Start taking on:  08/19/2016   nicotine 14 mg/24hr patch Commonly known as:  NICODERM CQ Place 1 patch (14 mg total) onto the skin daily. What changed:  when to take this  reasons to take this   nitroGLYCERIN 0.4 MG SL tablet Commonly known as:  NITROSTAT Place 1 tablet (0.4 mg total) under the tongue every 5 (five) minutes as needed for chest pain.   omeprazole 20 MG capsule Commonly known as:  PRILOSEC Take 2 capsules (40 mg total) by mouth 2 (two) times daily.   ondansetron 4 MG tablet Commonly known as:  ZOFRAN Take 1 tablet (4 mg total) by mouth every 8 (eight) hours as needed for nausea or vomiting. Take 1-2 tabs every 6-8 prn for nausea   oxyCODONE 5 MG immediate release tablet Commonly known as:  Oxy IR/ROXICODONE Take 1 tablet (5 mg total) by mouth every 6 (six) hours as needed for severe pain.   polyethylene glycol powder powder Commonly known as:  MIRALAX TAKE 6 CAPFULS OF MIRALAX IN A 32 OUNCE GATORADE AND DRINK THE WHOLE BEVERAGE FOLLOWED BY 3 CAPFULS TWICE A DAY FOR THE NEXT WEEK AND FOLLOW UP WITH YOUR PRIMARY CARE PHYSICIAN.   promethazine 25 MG tablet Commonly known as:  PHENERGAN Take 1 tablet (25 mg total) by mouth every 6 (six) hours as needed for nausea or vomiting.   sucralfate 1 GM/10ML suspension Commonly known as:  CARAFATE Take 10 mLs (1 g total) by mouth 4 (four) times daily.   tamsulosin 0.4 MG Caps capsule Commonly known as:  FLOMAX Take 1 capsule (0.4 mg total) by mouth  daily. Start taking on:  08/19/2016   thiamine 100 MG tablet Take 1 tablet (100 mg total) by mouth daily. Start taking on:  08/19/2016      Follow-up Information    [name], MD. Schedule an appointment as soon as possible for a visit in 1 week(s).   Specialty:  Internal Medicine Contact information: [address] [place] [address] [phone]          Allergies  Allergen Reactions  . Lisinopril Swelling    angioedema    Consultations:  none   Procedures/Studies: Dg Abd 1 View  Result Date: 08/18/2016 CLINICAL DATA:  Abdominal pain with nausea and vomiting EXAM: ABDOMEN - 1 VIEW COMPARISON:  August 16, 2016 CT abdomen and pelvis FINDINGS: There is postoperative change in the left abdomen. There is moderate stool in the colon. There is no bowel dilatation or air-fluid levels suggesting bowel obstruction. No free  air. There is a 4 mm calcification in the left mid kidney region. There is evidence of old trauma in the right pubic symphysis region. There are phleboliths in the pelvis. IMPRESSION: No bowel obstruction or free air. Extensive postoperative change left abdomen. 4 mm calculus left kidney region. Electronically Signed   By: Lowella Grip III M.D.   On: 08/18/2016 12:39   Ct Abdomen Pelvis W Contrast  Result Date: 08/16/2016 CLINICAL DATA:  History of multiple abdominal surgeries, constipation and nausea starting Saturday EXAM: CT ABDOMEN AND PELVIS WITH CONTRAST TECHNIQUE: Multidetector CT imaging of the abdomen and pelvis was performed using the standard protocol following bolus administration of intravenous contrast. CONTRAST:  131mL ISOVUE-300 IOPAMIDOL (ISOVUE-300) INJECTION 61% COMPARISON:  CT scan 06/13/2016 FINDINGS: Lower chest: Lung bases shows no acute findings. Hepatobiliary: Right hepatic lobe hemangioma is stable in size in appearance from prior exam. Measures 2.5 cm. No calcified gallstones are noted within gallbladder. Pancreas:  Unremarkable. No pancreatic ductal dilatation or surrounding inflammatory changes. Spleen: Normal in size without focal abnormality. Adrenals/Urinary Tract: No adrenal gland mass. No hydronephrosis or hydroureter. Again noted nonobstructive calculus in midpole of the left kidney measures 4 mm. Delayed renal images shows bilateral renal symmetrical excretion. Bilateral visualized proximal ureter is unremarkable. Stomach/Bowel: Again noted postsurgical changes GE junction region. Stable postsurgical changes distal stomach post gastro jejunostomy. No small bowel obstruction. No pericecal inflammation. Normal appendix noted axial image 55. The terminal ileum is unremarkable. Stable postsurgical changes in right abdomen mesentery. Scattered diverticula are noted descending colon. No evidence of acute colitis or diverticulitis. The sigmoid colon is empty collapsed. Some colonic gas noted in distal sigmoid colon. Moderate gas and colonic stool noted within rectum. Measures about 4 cm in diameter. No distal colonic obstruction. No distal colitis. Vascular/Lymphatic: Atherosclerotic calcifications of abdominal aorta and iliac arteries are noted. No aortic aneurysm. No retroperitoneal or mesenteric adenopathy. Reproductive: Prostate gland and seminal vesicles are unremarkable. Other: No ascites or free abdominal air. Musculoskeletal: Stable old fracture deformity of the right inferior pubic ramus adjacent to pubic symphysis. Sagittal images of the spine shows stable degenerative changes thoracolumbar spine. IMPRESSION: 1. No acute inflammatory process within abdomen. 2. Stable postsurgical changes. No evidence of small bowel or colonic obstruction. 3. No pericecal inflammation.  Normal appendix. 4. 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 5. No hydronephrosis or hydroureter. Again noted left nonobstructive nephrolithiasis. Bilateral renal symmetrical excretion. Electronically Signed   By: Lahoma Crocker M.D.   On: 08/16/2016  19:42       Subjective:   Discharge Exam: Vitals:   08/18/16 0630 08/18/16 1310  BP: (!) 162/99 (!) 112/96  Pulse: 83 79  Resp: 16 18  Temp: 98 F (36.7 C) 98.2 F (36.8 C)   Vitals:   08/18/16 0321 08/18/16 0630 08/18/16 1100 08/18/16 1310  BP: (!) 158/124 (!) 162/99  (!) 112/96  Pulse:  83  79  Resp:  16  18  Temp:  98 F (36.7 C)  98.2 F (36.8 C)  TempSrc:  Oral  Oral  SpO2:  100%  100%  Weight:   68.5 kg (151 lb 1.6 oz)     General: Pt is alert, awake, not in acute distress Cardiovascular: RRR, S1/S2 +, no rubs, no gallops Respiratory: CTA bilaterally, no wheezing, no rhonchi Abdominal: Soft, NT, ND, bowel sounds + Extremities: no edema, no cyanosis    The results of significant diagnostics from this hospitalization (including imaging, microbiology, ancillary and laboratory) are listed below for  reference.     Microbiology: No results found for this or any previous visit (from the past 240 hour(s)).   Labs: BNP (last 3 results) No results for input(s): BNP in the last 8760 hours. Basic Metabolic Panel:  Recent Labs Lab 08/16/16 1610 08/17/16 0359 08/18/16 0349  NA 135 137 131*  K 4.3 3.7 3.6  CL 97* 105 100*  CO2 26 25 23   GLUCOSE 115* 93 88  BUN 17 14 8   CREATININE 1.16 0.84 0.73  CALCIUM 10.3 8.7* 8.8*   Liver Function Tests:  Recent Labs Lab 08/16/16 1610 08/17/16 0359  AST 18 14*  ALT 18 14*  ALKPHOS 117 89  BILITOT 1.0 0.9  PROT 9.2* 7.0  ALBUMIN 4.7 3.8    Recent Labs Lab 08/16/16 1610  LIPASE 15   No results for input(s): AMMONIA in the last 168 hours. CBC:  Recent Labs Lab 08/16/16 1610 08/17/16 0359 08/18/16 0349  WBC 7.7 6.5 5.3  HGB 16.4 13.5 13.5  HCT 46.6 39.8 39.5  MCV 69.7* 69.9* 70.2*  PLT 339 283 278   Cardiac Enzymes: No results for input(s): CKTOTAL, CKMB, CKMBINDEX, TROPONINI in the last 168 hours. BNP: Invalid input(s): POCBNP CBG:  Recent Labs Lab 08/17/16 1211 08/17/16 1732  08/17/16 2152 08/18/16 0759 08/18/16 1233  GLUCAP 95 126* 106* 83 90   D-Dimer No results for input(s): DDIMER in the last 72 hours. Hgb A1c No results for input(s): HGBA1C in the last 72 hours. Lipid Profile No results for input(s): CHOL, HDL, LDLCALC, TRIG, CHOLHDL, LDLDIRECT in the last 72 hours. Thyroid function studies No results for input(s): TSH, T4TOTAL, T3FREE, THYROIDAB in the last 72 hours.  Invalid input(s): FREET3 Anemia work up No results for input(s): VITAMINB12, FOLATE, FERRITIN, TIBC, IRON, RETICCTPCT in the last 72 hours. Urinalysis    Component Value Date/Time   COLORURINE YELLOW 08/16/2016 1547   APPEARANCEUR CLEAR 08/16/2016 1547   LABSPEC >1.046 (H) 08/16/2016 1547   PHURINE 6.0 08/16/2016 1547   GLUCOSEU 50 (A) 08/16/2016 1547   HGBUR NEGATIVE 08/16/2016 1547   BILIRUBINUR NEGATIVE 08/16/2016 1547   BILIRUBINUR negative 03/15/2016 1005   KETONESUR 20 (A) 08/16/2016 1547   PROTEINUR 30 (A) 08/16/2016 1547   UROBILINOGEN 0.2 03/15/2016 1005   UROBILINOGEN 0.2 08/13/2014 0934   NITRITE NEGATIVE 08/16/2016 1547   LEUKOCYTESUR NEGATIVE 08/16/2016 1547   Sepsis Labs Invalid input(s): PROCALCITONIN,  WBC,  LACTICIDVEN Microbiology No results found for this or any previous visit (from the past 240 hour(s)).   Time coordinating discharge: Over 30 minutes  SIGNED:   Damita Lack, MD  [facility] 08/18/2016, 2:09 PM Pager   If 7PM-7AM, please contact night-coverage [email or web address] Password [email or web address]
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
## 2016-10-04 DIAGNOSIS — H2513 Age-related nuclear cataract, bilateral: Secondary | ICD-10-CM | POA: Diagnosis not present

## 2016-10-04 DIAGNOSIS — E119 Type 2 diabetes mellitus without complications: Secondary | ICD-10-CM | POA: Diagnosis not present

## 2016-10-04 DIAGNOSIS — H40013 Open angle with borderline findings, low risk, bilateral: Secondary | ICD-10-CM | POA: Diagnosis not present

## 2016-10-06 ENCOUNTER — Encounter: Payer: Self-pay | Admitting: Internal Medicine

## 2016-10-07 ENCOUNTER — Encounter: Payer: Self-pay | Admitting: Internal Medicine

## 2016-10-10 ENCOUNTER — Encounter: Payer: Self-pay | Admitting: Internal Medicine

## 2016-10-14 ENCOUNTER — Telehealth: Payer: Self-pay | Admitting: Internal Medicine

## 2016-10-14 DIAGNOSIS — I2510 Atherosclerotic heart disease of native coronary artery without angina pectoris: Secondary | ICD-10-CM

## 2016-10-14 MED ORDER — CARVEDILOL 6.25 MG PO TABS: 6.2500 mg | ORAL_TABLET | Freq: Two times a day (BID) | ORAL | 0 refills | Status: DC

## 2016-10-14 MED ORDER — FAMOTIDINE 20 MG PO TABS: 20.0000 mg | ORAL_TABLET | Freq: Two times a day (BID) | ORAL | 0 refills | Status: DC

## 2016-10-14 MED ORDER — METFORMIN HCL ER 500 MG PO TB24: 500.0000 mg | ORAL_TABLET | Freq: Every day | ORAL | 0 refills | Status: DC

## 2016-10-14 MED ORDER — AMLODIPINE BESYLATE 10 MG PO TABS: ORAL_TABLET | ORAL | 0 refills | Status: DC

## 2016-10-14 MED ORDER — ATORVASTATIN CALCIUM 40 MG PO TABS: 40.0000 mg | ORAL_TABLET | Freq: Every day | ORAL | 0 refills | Status: DC

## 2016-10-14 MED ORDER — TAMSULOSIN HCL 0.4 MG PO CAPS: 0.4000 mg | ORAL_CAPSULE | Freq: Every day | ORAL | 0 refills | Status: DC

## 2016-10-14 NOTE — Telephone Encounter (Signed)
Chronic medications refilled x 30 days

## 2016-10-14 NOTE — Telephone Encounter (Signed)
Patient called the office asking for a one month supply on his medication. Pt has an upcoming appt in June but doesn't have any of his medication. Please call rx to our pharmacy.  Thank you.

## 2016-11-01 ENCOUNTER — Ambulatory Visit: Payer: Self-pay | Admitting: Internal Medicine

## 2016-11-07 MED FILL — TAMSULOSIN HCL 0.4 MG CAP: 0.4 | 30 days supply | Qty: 30 | Fill #0

## 2016-11-07 MED FILL — FAMOTIDINE 20 MG TABLET: 20 | 30 days supply | Qty: 60 | Fill #0

## 2016-11-07 MED FILL — AMLODIPINE BESYLATE 10 MG T: 10 | 30 days supply | Qty: 30 | Fill #0

## 2016-11-07 MED FILL — CARVEDILOL 6.25 MG TABLET: 6.25 | 30 days supply | Qty: 60 | Fill #0

## 2016-11-07 MED FILL — ATORVASTATIN 40 MG TABLET: 40 | 30 days supply | Qty: 30 | Fill #0

## 2016-11-07 MED FILL — METFORMIN HCL ER 500 MG TAB: 500 | 30 days supply | Qty: 30 | Fill #0

## 2016-11-22 ENCOUNTER — Ambulatory Visit: Payer: Self-pay | Admitting: Internal Medicine

## 2016-11-24 ENCOUNTER — Encounter: Payer: Self-pay | Admitting: Internal Medicine

## 2016-11-24 ENCOUNTER — Ambulatory Visit: Payer: Medicaid Other | Attending: Internal Medicine | Admitting: Internal Medicine

## 2016-11-24 VITALS — BP 171/94 | HR 85 | Temp 98.3°F | Resp 16 | Wt 160.6 lb

## 2016-11-24 DIAGNOSIS — Z87442 Personal history of urinary calculi: Secondary | ICD-10-CM | POA: Diagnosis not present

## 2016-11-24 DIAGNOSIS — Z8673 Personal history of transient ischemic attack (TIA), and cerebral infarction without residual deficits: Secondary | ICD-10-CM | POA: Insufficient documentation

## 2016-11-24 DIAGNOSIS — Z8249 Family history of ischemic heart disease and other diseases of the circulatory system: Secondary | ICD-10-CM | POA: Diagnosis not present

## 2016-11-24 DIAGNOSIS — H409 Unspecified glaucoma: Secondary | ICD-10-CM | POA: Insufficient documentation

## 2016-11-24 DIAGNOSIS — F17210 Nicotine dependence, cigarettes, uncomplicated: Secondary | ICD-10-CM | POA: Insufficient documentation

## 2016-11-24 DIAGNOSIS — Z7984 Long term (current) use of oral hypoglycemic drugs: Secondary | ICD-10-CM | POA: Insufficient documentation

## 2016-11-24 DIAGNOSIS — Z6823 Body mass index (BMI) 23.0-23.9, adult: Secondary | ICD-10-CM | POA: Insufficient documentation

## 2016-11-24 DIAGNOSIS — Z79899 Other long term (current) drug therapy: Secondary | ICD-10-CM | POA: Diagnosis not present

## 2016-11-24 DIAGNOSIS — I252 Old myocardial infarction: Secondary | ICD-10-CM | POA: Insufficient documentation

## 2016-11-24 DIAGNOSIS — I2510 Atherosclerotic heart disease of native coronary artery without angina pectoris: Secondary | ICD-10-CM | POA: Diagnosis not present

## 2016-11-24 DIAGNOSIS — Z7982 Long term (current) use of aspirin: Secondary | ICD-10-CM | POA: Insufficient documentation

## 2016-11-24 DIAGNOSIS — R002 Palpitations: Secondary | ICD-10-CM | POA: Diagnosis not present

## 2016-11-24 DIAGNOSIS — Z888 Allergy status to other drugs, medicaments and biological substances status: Secondary | ICD-10-CM | POA: Diagnosis not present

## 2016-11-24 DIAGNOSIS — I10 Essential (primary) hypertension: Secondary | ICD-10-CM | POA: Diagnosis not present

## 2016-11-24 DIAGNOSIS — K219 Gastro-esophageal reflux disease without esophagitis: Secondary | ICD-10-CM | POA: Insufficient documentation

## 2016-11-24 DIAGNOSIS — Z800 Family history of malignant neoplasm of digestive organs: Secondary | ICD-10-CM | POA: Insufficient documentation

## 2016-11-24 DIAGNOSIS — Z1211 Encounter for screening for malignant neoplasm of colon: Secondary | ICD-10-CM | POA: Diagnosis not present

## 2016-11-24 DIAGNOSIS — E119 Type 2 diabetes mellitus without complications: Secondary | ICD-10-CM | POA: Diagnosis not present

## 2016-11-24 DIAGNOSIS — R634 Abnormal weight loss: Secondary | ICD-10-CM | POA: Insufficient documentation

## 2016-11-24 DIAGNOSIS — Z720 Tobacco use: Secondary | ICD-10-CM

## 2016-11-24 LAB — GLUCOSE, POCT (MANUAL RESULT ENTRY): POC GLUCOSE: 93 mg/dL (ref 70–99)

## 2016-11-24 LAB — POCT GLYCOSYLATED HEMOGLOBIN (HGB A1C): HEMOGLOBIN A1C: 6.1

## 2016-11-24 NOTE — Patient Instructions (Addendum)
Call 1-800-Quit Now and request Nicotine patches to help you quit smoking.  Try drinking shakes like Glucerna to supplement meals.   Take your blood pressure medications daily as prescribed.
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
## 2016-11-25 LAB — COMPREHENSIVE METABOLIC PANEL
A/G RATIO: 1.8 (ref 1.2–2.2)
ALT: 10 IU/L (ref 0–44)
AST: 13 IU/L (ref 0–40)
Albumin: 4.2 g/dL (ref 3.5–5.5)
Alkaline Phosphatase: 107 IU/L (ref 39–117)
BUN/Creatinine Ratio: 7 — ABNORMAL LOW (ref 9–20)
BUN: 6 mg/dL (ref 6–24)
Bilirubin Total: 0.4 mg/dL (ref 0.0–1.2)
CALCIUM: 8.9 mg/dL (ref 8.7–10.2)
CO2: 24 mmol/L (ref 20–29)
Chloride: 99 mmol/L (ref 96–106)
Creatinine, Ser: 0.88 mg/dL (ref 0.76–1.27)
GFR, EST AFRICAN AMERICAN: 109 mL/min/{1.73_m2} (ref >59)
GFR, EST NON AFRICAN AMERICAN: 94 mL/min/{1.73_m2} (ref >59)
GLOBULIN, TOTAL: 2.4 g/dL (ref 1.5–4.5)
Glucose: 83 mg/dL (ref 65–99)
POTASSIUM: 3.6 mmol/L (ref 3.5–5.2)
Sodium: 137 mmol/L (ref 134–144)
TOTAL PROTEIN: 6.6 g/dL (ref 6.0–8.5)

## 2016-11-25 LAB — CBC
HEMATOCRIT: 42.1 % (ref 37.5–51.0)
Hemoglobin: 13.3 g/dL (ref 13.0–17.7)
MCH: 23.7 pg — ABNORMAL LOW (ref 26.6–33.0)
MCHC: 31.6 g/dL (ref 31.5–35.7)
MCV: 75 fL — AB (ref 79–97)
PLATELETS: 244 10*3/uL (ref 150–379)
RBC: 5.62 x10E6/uL (ref 4.14–5.80)
RDW: 15.9 % — ABNORMAL HIGH (ref 12.3–15.4)
WBC: 4.5 10*3/uL (ref 3.4–10.8)

## 2016-11-25 LAB — MICROALBUMIN / CREATININE URINE RATIO
Creatinine, Urine: 124.7 mg/dL
Microalb/Creat Ratio: 4.5 mg/g{creat} (ref 0.0–30.0)
Microalbumin, Urine: 5.6 ug/mL

## 2016-11-25 LAB — TSH: TSH: 0.398 u[IU]/mL — ABNORMAL LOW (ref 0.450–4.500)

## 2016-11-25 NOTE — Addendum Note (Signed)
Addended by: Karle Plumber B on: 11/25/2016 01:15 PM   Modules accepted: Orders

## 2016-11-25 NOTE — Progress Notes (Signed)
Labs reviewed. TSH low suggesting hyperthyroidism. We will have the lab and free T3 and free T4 levels. If indeed this is hyperthyroidism we will cancel the cardiology referral for palpitations and treat for overactive thyroid. Results for orders placed or performed in visit on 11/24/16  Microalbumin/Creatinine Ratio, Urine  Result Value Ref Range   Creatinine, Urine 124.7 Not Estab. mg/dL   Albumin, Urine 5.6 Not Estab. ug/mL   Microalb/Creat Ratio 4.5 0.0 - 30.0 mg/g creat  Comprehensive metabolic panel  Result Value Ref Range   Glucose 83 65 - 99 mg/dL   BUN 6 6 - 24 mg/dL   Creatinine, Ser 0.88 0.76 - 1.27 mg/dL   GFR calc non Af Amer 94 >59 mL/min/1.73   GFR calc Af Amer 109 >59 mL/min/1.73   BUN/Creatinine Ratio 7 (L) 9 - 20   Sodium 137 134 - 144 mmol/L   Potassium 3.6 3.5 - 5.2 mmol/L   Chloride 99 96 - 106 mmol/L   CO2 24 20 - 29 mmol/L   Calcium 8.9 8.7 - 10.2 mg/dL   Total Protein 6.6 6.0 - 8.5 g/dL   Albumin 4.2 3.5 - 5.5 g/dL   Globulin, Total 2.4 1.5 - 4.5 g/dL   Albumin/Globulin Ratio 1.8 1.2 - 2.2   Bilirubin Total 0.4 0.0 - 1.2 mg/dL   Alkaline Phosphatase 107 39 - 117 IU/L   AST 13 0 - 40 IU/L   ALT 10 0 - 44 IU/L  TSH  Result Value Ref Range   TSH 0.398 (L) 0.450 - 4.500 uIU/mL  CBC  Result Value Ref Range   WBC 4.5 3.4 - 10.8 x10E3/uL   RBC 5.62 4.14 - 5.80 x10E6/uL   Hemoglobin 13.3 13.0 - 17.7 g/dL   Hematocrit 42.1 37.5 - 51.0 %   MCV 75 (L) 79 - 97 fL   MCH 23.7 (L) 26.6 - 33.0 pg   MCHC 31.6 31.5 - 35.7 g/dL   RDW 15.9 (H) 12.3 - 15.4 %   Platelets 244 150 - 379 x10E3/uL  POCT glucose (manual entry)  Result Value Ref Range   POC Glucose 93 70 - 99 mg/dl  POCT glycosylated hemoglobin (Hb A1C)  Result Value Ref Range   Hemoglobin A1C 6.1

## 2016-11-27 LAB — T4, FREE: FREE T4: 1.04 ng/dL (ref 0.82–1.77)

## 2016-11-27 LAB — SPECIMEN STATUS REPORT

## 2016-11-29 ENCOUNTER — Telehealth: Payer: Self-pay

## 2016-11-29 NOTE — Telephone Encounter (Signed)
Contacted pt to go over lab results pt is aware of results and doesn't have any questions or concerns 

## 2016-12-16 ENCOUNTER — Emergency Department (HOSPITAL_COMMUNITY)
Admission: EM | Admit: 2016-12-16 | Discharge: 2016-12-16 | Disposition: A | Discharge status: Home / Self Care | Payer: Medicaid Other | Attending: Emergency Medicine | Admitting: Emergency Medicine

## 2016-12-16 ENCOUNTER — Emergency Department (HOSPITAL_COMMUNITY): Payer: Medicaid Other

## 2016-12-16 ENCOUNTER — Encounter (HOSPITAL_COMMUNITY): Payer: Self-pay | Admitting: *Deleted

## 2016-12-16 DIAGNOSIS — F17210 Nicotine dependence, cigarettes, uncomplicated: Secondary | ICD-10-CM | POA: Insufficient documentation

## 2016-12-16 DIAGNOSIS — F17200 Nicotine dependence, unspecified, uncomplicated: Secondary | ICD-10-CM | POA: Diagnosis not present

## 2016-12-16 DIAGNOSIS — E876 Hypokalemia: Secondary | ICD-10-CM

## 2016-12-16 DIAGNOSIS — Z7984 Long term (current) use of oral hypoglycemic drugs: Secondary | ICD-10-CM | POA: Insufficient documentation

## 2016-12-16 DIAGNOSIS — Z7982 Long term (current) use of aspirin: Secondary | ICD-10-CM | POA: Diagnosis not present

## 2016-12-16 DIAGNOSIS — I2510 Atherosclerotic heart disease of native coronary artery without angina pectoris: Secondary | ICD-10-CM | POA: Diagnosis not present

## 2016-12-16 DIAGNOSIS — E119 Type 2 diabetes mellitus without complications: Secondary | ICD-10-CM | POA: Insufficient documentation

## 2016-12-16 DIAGNOSIS — Z79899 Other long term (current) drug therapy: Secondary | ICD-10-CM | POA: Diagnosis not present

## 2016-12-16 DIAGNOSIS — Z8673 Personal history of transient ischemic attack (TIA), and cerebral infarction without residual deficits: Secondary | ICD-10-CM | POA: Diagnosis not present

## 2016-12-16 DIAGNOSIS — I252 Old myocardial infarction: Secondary | ICD-10-CM | POA: Diagnosis not present

## 2016-12-16 DIAGNOSIS — I10 Essential (primary) hypertension: Secondary | ICD-10-CM | POA: Diagnosis not present

## 2016-12-16 DIAGNOSIS — K2100 Gastro-esophageal reflux disease with esophagitis, without bleeding: Secondary | ICD-10-CM | POA: Insufficient documentation

## 2016-12-16 DIAGNOSIS — R0789 Other chest pain: Secondary | ICD-10-CM

## 2016-12-16 DIAGNOSIS — R072 Precordial pain: Secondary | ICD-10-CM | POA: Insufficient documentation

## 2016-12-16 LAB — CBC
HCT: 39.2 % (ref 39.0–52.0)
Hemoglobin: 13.8 g/dL (ref 13.0–17.0)
MCH: 24.8 pg — AB (ref 26.0–34.0)
MCHC: 35.2 g/dL (ref 30.0–36.0)
MCV: 70.4 fL — ABNORMAL LOW (ref 78.0–100.0)
PLATELETS: 247 10*3/uL (ref 150–400)
RBC: 5.57 MIL/uL (ref 4.22–5.81)
RDW: 13.8 % (ref 11.5–15.5)
WBC: 5 10*3/uL (ref 4.0–10.5)

## 2016-12-16 LAB — BASIC METABOLIC PANEL
Anion gap: 7 (ref 5–15)
BUN: 7 mg/dL (ref 6–20)
CHLORIDE: 101 mmol/L (ref 101–111)
CO2: 27 mmol/L (ref 22–32)
CREATININE: 0.98 mg/dL (ref 0.61–1.24)
Calcium: 9 mg/dL (ref 8.9–10.3)
GFR calc non Af Amer: >60 mL/min (ref >60)
GLUCOSE: 64 mg/dL — AB (ref 65–99)
Potassium: 3.1 mmol/L — ABNORMAL LOW (ref 3.5–5.1)
Sodium: 135 mmol/L (ref 135–145)

## 2016-12-16 LAB — HEPATIC FUNCTION PANEL
ALT: 9 U/L — AB (ref 17–63)
AST: 17 U/L (ref 15–41)
Albumin: 3.6 g/dL (ref 3.5–5.0)
Alkaline Phosphatase: 101 U/L (ref 38–126)
Bilirubin, Direct: <0.1 mg/dL — ABNORMAL LOW (ref 0.1–0.5)
TOTAL PROTEIN: 7 g/dL (ref 6.5–8.1)
Total Bilirubin: 0.4 mg/dL (ref 0.3–1.2)

## 2016-12-16 LAB — I-STAT TROPONIN, ED
TROPONIN I, POC: 0 ng/mL (ref 0.00–0.08)
Troponin i, poc: 0.01 ng/mL (ref 0.00–0.08)

## 2016-12-16 LAB — LIPASE, BLOOD: Lipase: 23 U/L (ref 11–51)

## 2016-12-16 LAB — CBG MONITORING, ED: Glucose-Capillary: 84 mg/dL (ref 65–99)

## 2016-12-16 MED ORDER — OMEPRAZOLE 20 MG PO CPDR: 20.0000 mg | DELAYED_RELEASE_CAPSULE | Freq: Every day | ORAL | 0 refills | Status: DC

## 2016-12-16 MED ORDER — OXYCODONE-ACETAMINOPHEN 5-325 MG PO TABS
1.0000 | ORAL_TABLET | Freq: Once | ORAL | Status: AC
  Administered 2016-12-16: 1 via ORAL
  Filled 2016-12-16: qty 1

## 2016-12-16 MED ORDER — NITROGLYCERIN 0.4 MG SL SUBL
0.4000 mg | SUBLINGUAL_TABLET | SUBLINGUAL | Status: DC | PRN
  Administered 2016-12-16 (×2): 0.4 mg via SUBLINGUAL
  Filled 2016-12-16: qty 1

## 2016-12-16 MED ORDER — POTASSIUM CHLORIDE CRYS ER 20 MEQ PO TBCR
40.0000 meq | EXTENDED_RELEASE_TABLET | Freq: Once | ORAL | Status: AC
  Administered 2016-12-16: 40 meq via ORAL
  Filled 2016-12-16: qty 2

## 2016-12-16 MED ORDER — GI COCKTAIL ~~LOC~~
30.0000 mL | Freq: Once | ORAL | Status: AC
  Administered 2016-12-16: 30 mL via ORAL
  Filled 2016-12-16: qty 30

## 2016-12-16 MED ORDER — ASPIRIN 81 MG PO CHEW
324.0000 mg | CHEWABLE_TABLET | Freq: Once | ORAL | Status: AC
  Administered 2016-12-16: 324 mg via ORAL
  Filled 2016-12-16: qty 4

## 2016-12-16 NOTE — ED Notes (Signed)
Pt verbalized understanding discharge instructions and denies any further needs or questions at this time. VS stable, ambulatory and steady gait.   

## 2016-12-16 NOTE — ED Triage Notes (Signed)
Pt c/o intermittent L sided CP onset 3-4 days, pt c/o SOB with pain, pt denies current pain, denies n/v/d, hx of MI, pt denies seeing cardiologist, A& O x4
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
## 2016-12-16 NOTE — ED Notes (Signed)
Dr. Claiborne Billings, cardiologist, at bedside.
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
## 2016-12-19 ENCOUNTER — Telehealth: Payer: Self-pay | Admitting: Internal Medicine

## 2016-12-19 DIAGNOSIS — R918 Other nonspecific abnormal finding of lung field: Secondary | ICD-10-CM

## 2016-12-19 NOTE — Telephone Encounter (Signed)
Patient called wanting to speak to PCP regarding results, Please f/up

## 2016-12-19 NOTE — Telephone Encounter (Signed)
Will forward to pcp

## 2016-12-20 NOTE — Telephone Encounter (Signed)
Return phone call placed to patient this a.m. Patient was seen in the emergency room on the 27th of this month for chest pain that was felt to be noncardiac.. States he was told something was wrong with his blood tests. I reviewed studies and notes from the emergency room. I think there was some concern about his CBC with a normal hemoglobin but low indices. This is unchanged from previous CBC done earlier this month by me. Patient most likely has sickle cell trait or thalassemia trait neither of which would contribute to his symptoms. CXR did show nodular densities on each lung ? Nipple shadow.  Radiologist recommended repeat with nipple markers.  Order placed.  Pt told he can go to [facility] radiology any time this wk to have done.  I inquired about the referrals were placed when he saw me earlier this month to cardiology and GI. Patient states he was called by one of them but told him he will call back to reschedule. I recommend that he call them back and schedule the appointment. Follow-up with me middle of next month as was planned.

## 2017-01-18 ENCOUNTER — Encounter: Payer: Self-pay | Admitting: Physician Assistant

## 2017-01-18 ENCOUNTER — Ambulatory Visit (INDEPENDENT_AMBULATORY_CARE_PROVIDER_SITE_OTHER): Payer: Medicaid Other | Admitting: Physician Assistant

## 2017-01-18 VITALS — BP 140/90 | HR 63 | Ht 69.0 in | Wt 155.8 lb

## 2017-01-18 DIAGNOSIS — R9389 Abnormal findings on diagnostic imaging of other specified body structures: Secondary | ICD-10-CM

## 2017-01-18 DIAGNOSIS — R072 Precordial pain: Secondary | ICD-10-CM | POA: Diagnosis not present

## 2017-01-18 DIAGNOSIS — I2510 Atherosclerotic heart disease of native coronary artery without angina pectoris: Secondary | ICD-10-CM | POA: Diagnosis not present

## 2017-01-18 DIAGNOSIS — I10 Essential (primary) hypertension: Secondary | ICD-10-CM

## 2017-01-18 DIAGNOSIS — R938 Abnormal findings on diagnostic imaging of other specified body structures: Secondary | ICD-10-CM

## 2017-01-18 DIAGNOSIS — Z720 Tobacco use: Secondary | ICD-10-CM | POA: Diagnosis not present

## 2017-01-18 DIAGNOSIS — E785 Hyperlipidemia, unspecified: Secondary | ICD-10-CM | POA: Insufficient documentation

## 2017-01-18 MED ORDER — CARVEDILOL 6.25 MG PO TABS: 9.3750 mg | ORAL_TABLET | Freq: Two times a day (BID) | ORAL | 3 refills | Status: DC

## 2017-01-18 NOTE — Patient Instructions (Signed)
Medication Instructions:  1. INCREASE COREG TO 9.375 MG TWICE DAILY (THIS IS GOING TO BE 1 AND 1/2 TABS TWICE DAILY OF THE 6.25 MG TABLET)  Labwork: NONE ORDERED TODAY  Testing/Procedures: 1. Your physician has requested that you have a lexiscan myoview. For further information please visit [email or web address]. Please follow instruction sheet, as given.  2. A chest x-ray takes a picture of the organs and structures inside the chest, including the heart, lungs, and blood vessels. This test can show several things, including, whether the heart is enlarges; whether fluid is building up in the lungs; and whether pacemaker / defibrillator leads are still in place. TO BE DONE AT [facility]   Follow-Up: SC[facility] 6 WEEKS SAME DAY WITH DR. Burt Knack IN THE OFFICE  Any Other Special Instructions Will Be Listed Below (If Applicable).     If you need a refill on your cardiac medications before your next appointment, please call your pharmacy.
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
## 2017-01-26 ENCOUNTER — Telehealth: Payer: Self-pay | Admitting: Internal Medicine

## 2017-01-26 ENCOUNTER — Telehealth: Payer: Self-pay | Admitting: Physician Assistant

## 2017-01-26 NOTE — Telephone Encounter (Signed)
Pt called to speak with the nurse or the PCP, is very important ( did not want to say for what) please follow up

## 2017-01-26 NOTE — Telephone Encounter (Signed)
Recommend follow up with PCP for erectile dysfunction. Richardson Dopp, PA-C    01/26/2017 9:26 PM

## 2017-01-26 NOTE — Telephone Encounter (Signed)
Follow Up:   Please call,concerning the new blood pressure medicine that was called in.

## 2017-01-26 NOTE — Telephone Encounter (Signed)
I returned pt's call. Pt states to me that the increase in his coreg is "killing his nature". I asked pt what does this mean. Pt answered his sex drive. Pt states Richardson Dopp, [place] said he would give him Viagra. I did not see this in last ov 01/18/17. I advised pt he should contact PCP for the Viagra. Pt said PCP gave him a shot before for his ED. I then again said all the more if PCP has handled his ED in the past he should f/u with PCP then. I did tell the pt I will also d/w PA when he is back in the office. Pt thanked me for my call and said he will talk to PCP.

## 2017-01-30 ENCOUNTER — Telehealth (HOSPITAL_COMMUNITY): Payer: Self-pay | Admitting: *Deleted

## 2017-01-30 NOTE — Telephone Encounter (Signed)
Patient given detailed instructions per Myocardial Perfusion Study Information Sheet for the test on 02/02/17 at 1000. Patient notified to arrive 15 minutes early and that it is imperative to arrive on time for appointment to keep from having the test rescheduled.  If you need to cancel or reschedule your appointment, please call the office within 24 hours of your appointment. . Patient verbalized understanding.Jamilla Galli, [place]

## 2017-01-31 NOTE — Telephone Encounter (Signed)
Returned pt call and pt states that he is taking his bp medication but he doesn't know which bp medication is messing up his sex drive. Pt is requesting Viagra. Pt states he has medicaid and if medicaid doesn't cover it he would like a medication that is covered. Please f/u

## 2017-01-31 NOTE — Telephone Encounter (Signed)
Pt has been made aware that he will need to s/w PCP about sexual dysfunction and is asking for Viagra. Pt thanked me for the call back. Pt is agreeable to plan of care.

## 2017-01-31 NOTE — Telephone Encounter (Signed)
Pt. Called again requesting to speak with his PCP. Pt. Did not want to disclose the reason for the call he just stated it was important. Please f/u

## 2017-02-02 ENCOUNTER — Encounter (HOSPITAL_COMMUNITY): Payer: Self-pay

## 2017-02-09 ENCOUNTER — Other Ambulatory Visit: Payer: Self-pay | Admitting: Internal Medicine

## 2017-02-09 ENCOUNTER — Ambulatory Visit: Payer: Medicaid Other | Attending: Internal Medicine | Admitting: Internal Medicine

## 2017-02-09 ENCOUNTER — Encounter: Payer: Self-pay | Admitting: Internal Medicine

## 2017-02-09 VITALS — BP 152/99 | HR 80 | Temp 98.2°F | Resp 18 | Ht 69.0 in | Wt 160.2 lb

## 2017-02-09 DIAGNOSIS — R918 Other nonspecific abnormal finding of lung field: Secondary | ICD-10-CM | POA: Diagnosis not present

## 2017-02-09 DIAGNOSIS — Z8249 Family history of ischemic heart disease and other diseases of the circulatory system: Secondary | ICD-10-CM | POA: Diagnosis not present

## 2017-02-09 DIAGNOSIS — E119 Type 2 diabetes mellitus without complications: Secondary | ICD-10-CM | POA: Diagnosis not present

## 2017-02-09 DIAGNOSIS — E785 Hyperlipidemia, unspecified: Secondary | ICD-10-CM | POA: Diagnosis not present

## 2017-02-09 DIAGNOSIS — I252 Old myocardial infarction: Secondary | ICD-10-CM | POA: Insufficient documentation

## 2017-02-09 DIAGNOSIS — F1021 Alcohol dependence, in remission: Secondary | ICD-10-CM | POA: Diagnosis not present

## 2017-02-09 DIAGNOSIS — N529 Male erectile dysfunction, unspecified: Secondary | ICD-10-CM | POA: Insufficient documentation

## 2017-02-09 DIAGNOSIS — Z800 Family history of malignant neoplasm of digestive organs: Secondary | ICD-10-CM | POA: Diagnosis not present

## 2017-02-09 DIAGNOSIS — K219 Gastro-esophageal reflux disease without esophagitis: Secondary | ICD-10-CM | POA: Insufficient documentation

## 2017-02-09 DIAGNOSIS — Z888 Allergy status to other drugs, medicaments and biological substances status: Secondary | ICD-10-CM | POA: Diagnosis not present

## 2017-02-09 DIAGNOSIS — Z7984 Long term (current) use of oral hypoglycemic drugs: Secondary | ICD-10-CM | POA: Diagnosis not present

## 2017-02-09 DIAGNOSIS — F17200 Nicotine dependence, unspecified, uncomplicated: Secondary | ICD-10-CM | POA: Diagnosis not present

## 2017-02-09 DIAGNOSIS — Z23 Encounter for immunization: Secondary | ICD-10-CM | POA: Diagnosis not present

## 2017-02-09 DIAGNOSIS — I2510 Atherosclerotic heart disease of native coronary artery without angina pectoris: Secondary | ICD-10-CM

## 2017-02-09 DIAGNOSIS — H409 Unspecified glaucoma: Secondary | ICD-10-CM | POA: Insufficient documentation

## 2017-02-09 DIAGNOSIS — F17210 Nicotine dependence, cigarettes, uncomplicated: Secondary | ICD-10-CM | POA: Diagnosis not present

## 2017-02-09 DIAGNOSIS — Z7982 Long term (current) use of aspirin: Secondary | ICD-10-CM | POA: Diagnosis not present

## 2017-02-09 DIAGNOSIS — I10 Essential (primary) hypertension: Secondary | ICD-10-CM | POA: Diagnosis not present

## 2017-02-09 DIAGNOSIS — Z8673 Personal history of transient ischemic attack (TIA), and cerebral infarction without residual deficits: Secondary | ICD-10-CM | POA: Insufficient documentation

## 2017-02-09 DIAGNOSIS — Z87442 Personal history of urinary calculi: Secondary | ICD-10-CM | POA: Diagnosis not present

## 2017-02-09 DIAGNOSIS — Z79899 Other long term (current) drug therapy: Secondary | ICD-10-CM | POA: Diagnosis not present

## 2017-02-09 LAB — GLUCOSE, POCT (MANUAL RESULT ENTRY): POC GLUCOSE: 163 mg/dL — AB (ref 70–99)

## 2017-02-09 MED ORDER — VARENICLINE TARTRATE 0.5 MG X 11 & 1 MG X 42 PO MISC: ORAL | 0 refills | Status: DC

## 2017-02-09 MED ORDER — SILDENAFIL CITRATE 100 MG PO TABS: ORAL_TABLET | ORAL | 6 refills | Status: DC

## 2017-02-09 MED FILL — TAMSULOSIN HCL 0.4 MG CAP: 0.4 | 15 days supply | Qty: 15 | Fill #0

## 2017-02-09 MED FILL — FAMOTIDINE 20 MG TABLET: 20 | 30 days supply | Qty: 60 | Fill #0

## 2017-02-09 MED FILL — ATORVASTATIN 40 MG TABLET: 40 | 30 days supply | Qty: 30 | Fill #0

## 2017-02-09 MED FILL — CARVEDILOL 6.25 MG TABLET: 6.25 | 30 days supply | Qty: 60 | Fill #0

## 2017-02-09 MED FILL — AMLODIPINE BESYLATE 10 MG T: 10 | 30 days supply | Qty: 30 | Fill #0

## 2017-02-09 MED FILL — METFORMIN HCL ER 500 MG TAB: 500 | 30 days supply | Qty: 30 | Fill #0

## 2017-02-09 NOTE — Patient Instructions (Addendum)
Take Viagra as needed and discussed. Possible side effects of this medication includes  prolong erection, flushing, headaches, stuff nose and sudden vision and hearing changes. Be seen in ER if he has erection lasting longer than 3-4 hrs, or if he has sudden vision changes or hearing loss.  Do not take Nitroglycerin within 24 hours of taking Viagra.   Start Chantix as discussed to help with smoking cessation.  Go and have your chest x-ray done.
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
## 2017-02-13 ENCOUNTER — Telehealth: Payer: Self-pay | Admitting: *Deleted

## 2017-02-13 ENCOUNTER — Ambulatory Visit (HOSPITAL_COMMUNITY)
Admission: RE | Admit: 2017-02-13 | Discharge: 2017-02-13 | Disposition: A | Discharge status: Home / Self Care | Payer: Medicaid Other | Source: Ambulatory Visit | Attending: Physician Assistant | Admitting: Physician Assistant

## 2017-02-13 DIAGNOSIS — R938 Abnormal findings on diagnostic imaging of other specified body structures: Secondary | ICD-10-CM | POA: Diagnosis present

## 2017-02-13 DIAGNOSIS — R918 Other nonspecific abnormal finding of lung field: Secondary | ICD-10-CM | POA: Diagnosis not present

## 2017-02-13 DIAGNOSIS — R9389 Abnormal findings on diagnostic imaging of other specified body structures: Secondary | ICD-10-CM

## 2017-02-13 MED FILL — !VIAGRA 100MG TABLET: 100 | 30 days supply | Qty: 3 | Fill #0

## 2017-02-13 NOTE — Telephone Encounter (Signed)
-----   Message from Liliane Shi, [place] sent at 02/13/2017  2:51 PM EDT ----- Please call the patient. The chest X-ray is ok.  The abnormalities noted during ED visit in August are nipple shadows. Continue current medications and follow up as planned.  Please fax a copy of this study result to his PCP:  [name], MD  Thanks! Richardson Dopp, PA-C    02/13/2017 2:50 PM

## 2017-02-13 NOTE — Telephone Encounter (Signed)
Pt has been notified of CXR results by phone with verbal understanding. Pt thanked me for my call. I will forward a copy of results to PCP.

## 2017-02-14 ENCOUNTER — Ambulatory Visit: Payer: Self-pay | Attending: Internal Medicine

## 2017-02-17 ENCOUNTER — Encounter: Payer: Self-pay | Admitting: Cardiovascular Disease

## 2017-03-02 ENCOUNTER — Ambulatory Visit: Payer: Medicaid Other | Admitting: Cardiovascular Disease

## 2017-03-08 ENCOUNTER — Other Ambulatory Visit: Payer: Self-pay

## 2017-03-08 DIAGNOSIS — N529 Male erectile dysfunction, unspecified: Secondary | ICD-10-CM

## 2017-03-08 MED ORDER — SILDENAFIL CITRATE 100 MG PO TABS: ORAL_TABLET | ORAL | 30 refills | Status: DC

## 2017-03-20 ENCOUNTER — Telehealth (HOSPITAL_COMMUNITY): Payer: Self-pay | Admitting: *Deleted

## 2017-03-20 NOTE — Telephone Encounter (Signed)
Patient given detailed instructions per Myocardial Perfusion Study Information Sheet for the test on 03/23/17 at 1000. Patient notified to arrive 15 minutes early and that it is imperative to arrive on time for appointment to keep from having the test rescheduled.  If you need to cancel or reschedule your appointment, please call the office within 24 hours of your appointment. . Patient verbalized understanding.Meggan Dhaliwal, [place]e [place]

## 2017-03-23 ENCOUNTER — Encounter (HOSPITAL_COMMUNITY): Payer: Self-pay

## 2017-04-03 ENCOUNTER — Telehealth: Payer: Self-pay | Admitting: Internal Medicine

## 2017-04-03 NOTE — Telephone Encounter (Signed)
Pt called since is breaking out and the med is not working , please call him back

## 2017-04-04 ENCOUNTER — Ambulatory Visit: Payer: Self-pay | Admitting: Internal Medicine

## 2017-04-04 NOTE — Telephone Encounter (Signed)
Pt has an appointment schedule with provider today 04/04/17

## 2017-04-04 NOTE — Telephone Encounter (Signed)
Will forward to pcp

## 2017-04-04 NOTE — Telephone Encounter (Signed)
Pt. Called to cancel his appt. And stated that he only wants to speak with his PCP regarding his break out. Please f/u

## 2017-04-05 NOTE — Telephone Encounter (Signed)
Pt was called and he did not want to come in since the weather will be bad

## 2017-04-05 NOTE — Telephone Encounter (Signed)
Will forward to carlos

## 2017-05-11 ENCOUNTER — Ambulatory Visit: Payer: Self-pay | Admitting: Internal Medicine

## 2017-06-09 MED FILL — METFORMIN HCL ER 500 MG TAB: 500 | 30 days supply | Qty: 30 | Fill #1

## 2017-06-09 MED FILL — CARVEDILOL 6.25 MG TABLET: 6.25 | 30 days supply | Qty: 60 | Fill #1

## 2017-06-09 MED FILL — AMLODIPINE BESYLATE 10 MG T: 10 | 30 days supply | Qty: 30 | Fill #1

## 2017-06-09 MED FILL — $VIAGRA 100 MG TABLET: 100 | 30 days supply | Qty: 10 | Fill #0

## 2017-06-13 ENCOUNTER — Other Ambulatory Visit: Payer: Self-pay

## 2017-06-13 MED ORDER — FAMOTIDINE 40 MG PO TABS: 40.0000 mg | ORAL_TABLET | Freq: Every day | ORAL | 2 refills | Status: DC

## 2017-06-16 ENCOUNTER — Encounter (HOSPITAL_COMMUNITY): Payer: Self-pay | Admitting: Emergency Medicine

## 2017-06-16 DIAGNOSIS — Z79899 Other long term (current) drug therapy: Secondary | ICD-10-CM | POA: Insufficient documentation

## 2017-06-16 DIAGNOSIS — Z8673 Personal history of transient ischemic attack (TIA), and cerebral infarction without residual deficits: Secondary | ICD-10-CM | POA: Diagnosis not present

## 2017-06-16 DIAGNOSIS — M5431 Sciatica, right side: Secondary | ICD-10-CM | POA: Insufficient documentation

## 2017-06-16 DIAGNOSIS — I2510 Atherosclerotic heart disease of native coronary artery without angina pectoris: Secondary | ICD-10-CM | POA: Insufficient documentation

## 2017-06-16 DIAGNOSIS — Z7982 Long term (current) use of aspirin: Secondary | ICD-10-CM | POA: Diagnosis not present

## 2017-06-16 DIAGNOSIS — I252 Old myocardial infarction: Secondary | ICD-10-CM | POA: Diagnosis not present

## 2017-06-16 DIAGNOSIS — E119 Type 2 diabetes mellitus without complications: Secondary | ICD-10-CM | POA: Insufficient documentation

## 2017-06-16 DIAGNOSIS — M79604 Pain in right leg: Secondary | ICD-10-CM | POA: Diagnosis present

## 2017-06-16 DIAGNOSIS — F17210 Nicotine dependence, cigarettes, uncomplicated: Secondary | ICD-10-CM | POA: Diagnosis not present

## 2017-06-16 DIAGNOSIS — I10 Essential (primary) hypertension: Secondary | ICD-10-CM | POA: Diagnosis not present

## 2017-06-16 LAB — CBC
HCT: 36.5 % — ABNORMAL LOW (ref 39.0–52.0)
Hemoglobin: 12.5 g/dL — ABNORMAL LOW (ref 13.0–17.0)
MCH: 24.5 pg — ABNORMAL LOW (ref 26.0–34.0)
MCHC: 34.2 g/dL (ref 30.0–36.0)
MCV: 71.6 fL — ABNORMAL LOW (ref 78.0–100.0)
Platelets: 246 10*3/uL (ref 150–400)
RBC: 5.1 MIL/uL (ref 4.22–5.81)
RDW: 14.3 % (ref 11.5–15.5)
WBC: 4.8 10*3/uL (ref 4.0–10.5)

## 2017-06-16 LAB — BASIC METABOLIC PANEL
Anion gap: 10 (ref 5–15)
BUN: 8 mg/dL (ref 6–20)
CO2: 22 mmol/L (ref 22–32)
Calcium: 9 mg/dL (ref 8.9–10.3)
Chloride: 102 mmol/L (ref 101–111)
Creatinine, Ser: 0.97 mg/dL (ref 0.61–1.24)
GFR calc Af Amer: >60 mL/min (ref >60)
GFR calc non Af Amer: >60 mL/min (ref >60)
Glucose, Bld: 87 mg/dL (ref 65–99)
Potassium: 3.8 mmol/L (ref 3.5–5.1)
Sodium: 134 mmol/L — ABNORMAL LOW (ref 135–145)

## 2017-06-16 LAB — D-DIMER, QUANTITATIVE (NOT AT ARMC): D-Dimer, Quant: <0.27 ug/mL-FEU (ref 0.00–0.50)

## 2017-06-16 NOTE — ED Triage Notes (Signed)
Pt reports R calf pain present X3 weeks, sore/warm to the touch, concern for blood clot.

## 2017-06-17 ENCOUNTER — Emergency Department (HOSPITAL_COMMUNITY)
Admission: EM | Admit: 2017-06-17 | Discharge: 2017-06-17 | Disposition: A | Discharge status: Home / Self Care | Payer: Medicaid Other | Attending: Emergency Medicine | Admitting: Emergency Medicine

## 2017-06-17 DIAGNOSIS — M5431 Sciatica, right side: Secondary | ICD-10-CM

## 2017-06-17 MED ORDER — TRAMADOL HCL 50 MG PO TABS: 50.0000 mg | ORAL_TABLET | Freq: Four times a day (QID) | ORAL | 0 refills | Status: DC | PRN

## 2017-06-17 MED ORDER — HYDROCODONE-ACETAMINOPHEN 5-325 MG PO TABS
2.0000 | ORAL_TABLET | Freq: Once | ORAL | Status: AC
  Administered 2017-06-17: 2 via ORAL
  Filled 2017-06-17: qty 2
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
## 2017-06-19 ENCOUNTER — Telehealth: Payer: Self-pay | Admitting: Internal Medicine

## 2017-06-19 NOTE — Telephone Encounter (Signed)
Pt called to request a call back.He was just in the ED and his back is still hurting and his leg is feeling hot Please follow up.

## 2017-06-19 NOTE — Telephone Encounter (Signed)
Will forward to pcp. Will contact pt once I get a response from pcp

## 2017-06-21 NOTE — Telephone Encounter (Signed)
Pt has an appointment schedule for 06/29/17 for a physical

## 2017-06-21 NOTE — Telephone Encounter (Signed)
Contacted pt to inform hit that Dr. Wynetta Emery would like to see him for a ER f/u. I informed pt that he does have an appointment scheduled for February 7 @130pm  and to make sure he keeps that appointment. Pt states he understands and doesn't have any questions or concerns

## 2017-06-29 ENCOUNTER — Encounter: Payer: Self-pay | Admitting: Internal Medicine

## 2017-06-29 ENCOUNTER — Ambulatory Visit: Payer: Medicaid Other | Attending: Internal Medicine | Admitting: Internal Medicine

## 2017-06-29 ENCOUNTER — Telehealth: Payer: Self-pay | Admitting: Internal Medicine

## 2017-06-29 VITALS — BP 168/120 | HR 84 | Temp 97.7°F | Resp 16 | Ht 69.0 in | Wt 158.4 lb

## 2017-06-29 DIAGNOSIS — K219 Gastro-esophageal reflux disease without esophagitis: Secondary | ICD-10-CM | POA: Diagnosis not present

## 2017-06-29 DIAGNOSIS — Z823 Family history of stroke: Secondary | ICD-10-CM | POA: Diagnosis not present

## 2017-06-29 DIAGNOSIS — M5416 Radiculopathy, lumbar region: Secondary | ICD-10-CM | POA: Insufficient documentation

## 2017-06-29 DIAGNOSIS — Z87442 Personal history of urinary calculi: Secondary | ICD-10-CM | POA: Insufficient documentation

## 2017-06-29 DIAGNOSIS — I10 Essential (primary) hypertension: Secondary | ICD-10-CM

## 2017-06-29 DIAGNOSIS — Z79891 Long term (current) use of opiate analgesic: Secondary | ICD-10-CM | POA: Insufficient documentation

## 2017-06-29 DIAGNOSIS — E119 Type 2 diabetes mellitus without complications: Secondary | ICD-10-CM | POA: Diagnosis not present

## 2017-06-29 DIAGNOSIS — I2510 Atherosclerotic heart disease of native coronary artery without angina pectoris: Secondary | ICD-10-CM | POA: Insufficient documentation

## 2017-06-29 DIAGNOSIS — Z808 Family history of malignant neoplasm of other organs or systems: Secondary | ICD-10-CM | POA: Diagnosis not present

## 2017-06-29 DIAGNOSIS — Z8249 Family history of ischemic heart disease and other diseases of the circulatory system: Secondary | ICD-10-CM | POA: Insufficient documentation

## 2017-06-29 DIAGNOSIS — Z8673 Personal history of transient ischemic attack (TIA), and cerebral infarction without residual deficits: Secondary | ICD-10-CM | POA: Diagnosis not present

## 2017-06-29 DIAGNOSIS — Z7984 Long term (current) use of oral hypoglycemic drugs: Secondary | ICD-10-CM | POA: Insufficient documentation

## 2017-06-29 DIAGNOSIS — I252 Old myocardial infarction: Secondary | ICD-10-CM | POA: Insufficient documentation

## 2017-06-29 DIAGNOSIS — N529 Male erectile dysfunction, unspecified: Secondary | ICD-10-CM | POA: Diagnosis not present

## 2017-06-29 DIAGNOSIS — F17210 Nicotine dependence, cigarettes, uncomplicated: Secondary | ICD-10-CM | POA: Diagnosis not present

## 2017-06-29 DIAGNOSIS — Z7982 Long term (current) use of aspirin: Secondary | ICD-10-CM | POA: Diagnosis not present

## 2017-06-29 DIAGNOSIS — Z9889 Other specified postprocedural states: Secondary | ICD-10-CM | POA: Insufficient documentation

## 2017-06-29 DIAGNOSIS — Z79899 Other long term (current) drug therapy: Secondary | ICD-10-CM | POA: Insufficient documentation

## 2017-06-29 LAB — POCT GLYCOSYLATED HEMOGLOBIN (HGB A1C): Hemoglobin A1C: 6.2

## 2017-06-29 LAB — GLUCOSE, POCT (MANUAL RESULT ENTRY): POC GLUCOSE: 104 mg/dL — AB (ref 70–99)

## 2017-06-29 MED ORDER — CARVEDILOL 6.25 MG PO TABS: ORAL_TABLET | ORAL | 6 refills | Status: DC

## 2017-06-29 MED ORDER — TRAMADOL HCL 50 MG PO TABS: 50.0000 mg | ORAL_TABLET | Freq: Three times a day (TID) | ORAL | 0 refills | Status: DC | PRN

## 2017-06-29 MED ORDER — GABAPENTIN 300 MG PO CAPS: ORAL_CAPSULE | ORAL | 2 refills | Status: DC

## 2017-06-29 MED ORDER — AMLODIPINE BESYLATE 10 MG PO TABS: 10.0000 mg | ORAL_TABLET | Freq: Every day | ORAL | 6 refills | Status: DC

## 2017-06-29 MED ORDER — METHOCARBAMOL 500 MG PO TABS: 500.0000 mg | ORAL_TABLET | Freq: Three times a day (TID) | ORAL | 0 refills | Status: DC | PRN

## 2017-06-29 NOTE — Telephone Encounter (Signed)
Troy Mclaughlin got medication approved for the prior auth.

## 2017-06-29 NOTE — Telephone Encounter (Signed)
Talked with stacey and she will try and do prior auth for tramadol
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
## 2017-06-29 NOTE — Telephone Encounter (Signed)
Patient called and stated pharmacy will not release tramadol to patient, patient stated they were waiting for provid authorization.

## 2017-06-29 NOTE — Patient Instructions (Signed)
Use the Tramadol as needed for pain.  Start Gabapentin at bedtime daily for 3 days then increase to twice a day.  Both medications can cause drowsiness.    Robaxin is a muscle relaxant to use as needed.  Use a heating pad to lower back as needed.    Your blood pressure is elevated.  I have sent refill on Amlodipine and Carvedilol to your pharmacy.

## 2017-07-14 ENCOUNTER — Emergency Department (HOSPITAL_COMMUNITY): Payer: Medicaid Other

## 2017-07-14 ENCOUNTER — Encounter (HOSPITAL_COMMUNITY): Payer: Self-pay | Admitting: Emergency Medicine

## 2017-07-14 ENCOUNTER — Emergency Department (HOSPITAL_COMMUNITY)
Admission: EM | Admit: 2017-07-14 | Discharge: 2017-07-15 | Disposition: A | Discharge status: Home / Self Care | Payer: Medicaid Other | Attending: Emergency Medicine | Admitting: Emergency Medicine

## 2017-07-14 DIAGNOSIS — R1012 Left upper quadrant pain: Secondary | ICD-10-CM | POA: Diagnosis present

## 2017-07-14 DIAGNOSIS — Z7984 Long term (current) use of oral hypoglycemic drugs: Secondary | ICD-10-CM | POA: Diagnosis not present

## 2017-07-14 DIAGNOSIS — B349 Viral infection, unspecified: Secondary | ICD-10-CM | POA: Diagnosis not present

## 2017-07-14 DIAGNOSIS — Z79899 Other long term (current) drug therapy: Secondary | ICD-10-CM | POA: Insufficient documentation

## 2017-07-14 DIAGNOSIS — R0789 Other chest pain: Secondary | ICD-10-CM | POA: Diagnosis not present

## 2017-07-14 DIAGNOSIS — R112 Nausea with vomiting, unspecified: Secondary | ICD-10-CM | POA: Diagnosis not present

## 2017-07-14 DIAGNOSIS — I252 Old myocardial infarction: Secondary | ICD-10-CM | POA: Diagnosis not present

## 2017-07-14 DIAGNOSIS — F17210 Nicotine dependence, cigarettes, uncomplicated: Secondary | ICD-10-CM | POA: Insufficient documentation

## 2017-07-14 DIAGNOSIS — Z7982 Long term (current) use of aspirin: Secondary | ICD-10-CM | POA: Insufficient documentation

## 2017-07-14 DIAGNOSIS — E119 Type 2 diabetes mellitus without complications: Secondary | ICD-10-CM | POA: Insufficient documentation

## 2017-07-14 DIAGNOSIS — R05 Cough: Secondary | ICD-10-CM | POA: Diagnosis not present

## 2017-07-14 DIAGNOSIS — R5383 Other fatigue: Secondary | ICD-10-CM | POA: Diagnosis not present

## 2017-07-14 DIAGNOSIS — I10 Essential (primary) hypertension: Secondary | ICD-10-CM | POA: Insufficient documentation

## 2017-07-14 DIAGNOSIS — R079 Chest pain, unspecified: Secondary | ICD-10-CM | POA: Diagnosis not present

## 2017-07-14 LAB — I-STAT TROPONIN, ED
TROPONIN I, POC: 0 ng/mL (ref 0.00–0.08)
TROPONIN I, POC: 0.01 ng/mL (ref 0.00–0.08)

## 2017-07-14 LAB — HEPATIC FUNCTION PANEL
ALK PHOS: 111 U/L (ref 38–126)
ALT: 13 U/L — ABNORMAL LOW (ref 17–63)
AST: 21 U/L (ref 15–41)
Albumin: 4.4 g/dL (ref 3.5–5.0)
BILIRUBIN TOTAL: 0.7 mg/dL (ref 0.3–1.2)
Total Protein: 9.2 g/dL — ABNORMAL HIGH (ref 6.5–8.1)

## 2017-07-14 LAB — CBC
HCT: 46 % (ref 39.0–52.0)
Hemoglobin: 16.3 g/dL (ref 13.0–17.0)
MCH: 25.2 pg — AB (ref 26.0–34.0)
MCHC: 35.4 g/dL (ref 30.0–36.0)
MCV: 71 fL — AB (ref 78.0–100.0)
PLATELETS: 235 10*3/uL (ref 150–400)
RBC: 6.48 MIL/uL — ABNORMAL HIGH (ref 4.22–5.81)
RDW: 14.5 % (ref 11.5–15.5)
WBC: 7.4 10*3/uL (ref 4.0–10.5)

## 2017-07-14 LAB — BASIC METABOLIC PANEL
Anion gap: 15 (ref 5–15)
BUN: 21 mg/dL — AB (ref 6–20)
CALCIUM: 9.3 mg/dL (ref 8.9–10.3)
CO2: 24 mmol/L (ref 22–32)
CREATININE: 1.16 mg/dL (ref 0.61–1.24)
Chloride: 94 mmol/L — ABNORMAL LOW (ref 101–111)
GFR calc Af Amer: >60 mL/min (ref >60)
GFR calc non Af Amer: >60 mL/min (ref >60)
GLUCOSE: 123 mg/dL — AB (ref 65–99)
POTASSIUM: 4.7 mmol/L (ref 3.5–5.1)
SODIUM: 133 mmol/L — AB (ref 135–145)

## 2017-07-14 LAB — LIPASE, BLOOD: LIPASE: 24 U/L (ref 11–51)

## 2017-07-14 MED ORDER — IOPAMIDOL (ISOVUE-300) INJECTION 61%
INTRAVENOUS | Status: AC
  Administered 2017-07-14: 100 mL via INTRAVENOUS
  Filled 2017-07-14: qty 100

## 2017-07-14 MED ORDER — AMLODIPINE BESYLATE 5 MG PO TABS
10.0000 mg | ORAL_TABLET | Freq: Once | ORAL | Status: AC
  Administered 2017-07-14: 10 mg via ORAL
  Filled 2017-07-14: qty 2

## 2017-07-14 MED ORDER — SODIUM CHLORIDE 0.9 % IV BOLUS (SEPSIS)
500.0000 mL | Freq: Once | INTRAVENOUS | Status: AC
  Administered 2017-07-14: 500 mL via INTRAVENOUS

## 2017-07-14 MED ORDER — METOCLOPRAMIDE HCL 5 MG/ML IJ SOLN
10.0000 mg | Freq: Once | INTRAMUSCULAR | Status: AC
  Administered 2017-07-14: 10 mg via INTRAVENOUS
  Filled 2017-07-14: qty 2

## 2017-07-14 MED ORDER — FENTANYL CITRATE (PF) 100 MCG/2ML IJ SOLN
50.0000 ug | Freq: Once | INTRAMUSCULAR | Status: AC
  Administered 2017-07-14: 50 ug via INTRAVENOUS
  Filled 2017-07-14: qty 2

## 2017-07-14 MED ORDER — ONDANSETRON HCL 4 MG/2ML IJ SOLN
4.0000 mg | Freq: Once | INTRAMUSCULAR | Status: AC
  Administered 2017-07-14: 4 mg via INTRAVENOUS
  Filled 2017-07-14: qty 2

## 2017-07-14 MED ORDER — ONDANSETRON 4 MG PO TBDP
4.0000 mg | ORAL_TABLET | Freq: Once | ORAL | Status: AC | PRN
  Administered 2017-07-14: 4 mg via ORAL
  Filled 2017-07-14: qty 1
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
## 2017-07-14 NOTE — ED Triage Notes (Signed)
Patient c/o N/V with decreased appetite, fevers, and headaches  x3 days. Patient also c/o sharp central chest pain x5 minutes.

## 2017-07-15 LAB — URINALYSIS, ROUTINE W REFLEX MICROSCOPIC
BILIRUBIN URINE: NEGATIVE
Bacteria, UA: NONE SEEN
Glucose, UA: 150 mg/dL — AB
Ketones, ur: 20 mg/dL — AB
Leukocytes, UA: NEGATIVE
Nitrite: NEGATIVE
Protein, ur: 100 mg/dL — AB
SQUAMOUS EPITHELIAL / LPF: NONE SEEN
pH: 5 (ref 5.0–8.0)

## 2017-07-15 MED ORDER — ONDANSETRON 4 MG PO TBDP: ORAL_TABLET | ORAL | 0 refills | Status: DC

## 2017-07-15 MED ORDER — DICYCLOMINE HCL 10 MG/ML IM SOLN
20.0000 mg | Freq: Once | INTRAMUSCULAR | Status: AC
  Administered 2017-07-15: 20 mg via INTRAMUSCULAR
  Filled 2017-07-15: qty 2

## 2017-07-15 MED ORDER — PROMETHAZINE HCL 25 MG/ML IJ SOLN
25.0000 mg | Freq: Once | INTRAMUSCULAR | Status: AC
  Administered 2017-07-15: 25 mg via INTRAVENOUS
  Filled 2017-07-15: qty 1

## 2017-08-25 MED FILL — $VIAGRA 100 MG TABLET: 100 | 30 days supply | Qty: 10 | Fill #1

## 2017-08-25 MED FILL — ATORVASTATIN 40 MG TABLET: 40 | 30 days supply | Qty: 30 | Fill #1

## 2017-08-25 MED FILL — CARVEDILOL 6.25 MG TABLET: 6.25 | 30 days supply | Qty: 60 | Fill #2

## 2017-08-25 MED FILL — AMLODIPINE BESYLATE 10 MG T: 10 | 30 days supply | Qty: 30 | Fill #2

## 2017-08-25 MED FILL — METFORMIN HCL ER 500 MG TAB: 500 | 30 days supply | Qty: 30 | Fill #2

## 2017-08-28 ENCOUNTER — Telehealth: Payer: Self-pay

## 2017-08-28 MED FILL — GABAPENTIN 300 MG CAPSULE: 300 | 30 days supply | Qty: 60 | Fill #0

## 2017-08-28 NOTE — Telephone Encounter (Signed)
Pt called and requested a refill on traMADol (ULTRAM) 50 MG tablet and methocarbamol (ROBAXIN) 500 MG tablet.   Tried contacting pt to inform him that per Dr. Wynetta Emery last note she was trying him on a Short course of tramadol and Robaxin to use as needed for his lumbar radiculopathy. Pt was suppose to have a 6 week f/u for bp and back pain but he didn't schedule an appointment. Pt didn't answer and was unable to lvm.

## 2017-08-28 NOTE — Telephone Encounter (Signed)
Tried contacting pt again to schedule appointment pt states he will have to give me a call back to schedule appointment he has to check his book

## 2017-10-07 ENCOUNTER — Emergency Department (HOSPITAL_COMMUNITY)
Admission: EM | Admit: 2017-10-07 | Discharge: 2017-10-07 | Disposition: A | Discharge status: Home / Self Care | Payer: Medicaid Other | Attending: Emergency Medicine | Admitting: Emergency Medicine

## 2017-10-07 ENCOUNTER — Other Ambulatory Visit: Payer: Self-pay

## 2017-10-07 ENCOUNTER — Emergency Department (HOSPITAL_COMMUNITY): Payer: Medicaid Other

## 2017-10-07 ENCOUNTER — Encounter (HOSPITAL_COMMUNITY): Payer: Self-pay | Admitting: Emergency Medicine

## 2017-10-07 DIAGNOSIS — Z5321 Procedure and treatment not carried out due to patient leaving prior to being seen by health care provider: Secondary | ICD-10-CM | POA: Diagnosis not present

## 2017-10-07 DIAGNOSIS — R079 Chest pain, unspecified: Secondary | ICD-10-CM | POA: Insufficient documentation

## 2017-10-07 LAB — BASIC METABOLIC PANEL
Anion gap: 9 (ref 5–15)
BUN: 5 mg/dL — ABNORMAL LOW (ref 6–20)
CALCIUM: 8.8 mg/dL — AB (ref 8.9–10.3)
CO2: 23 mmol/L (ref 22–32)
CREATININE: 1.01 mg/dL (ref 0.61–1.24)
Chloride: 100 mmol/L — ABNORMAL LOW (ref 101–111)
GFR calc Af Amer: >60 mL/min (ref >60)
Glucose, Bld: 79 mg/dL (ref 65–99)
Potassium: 3.4 mmol/L — ABNORMAL LOW (ref 3.5–5.1)
SODIUM: 132 mmol/L — AB (ref 135–145)

## 2017-10-07 LAB — CBC
HCT: 42 % (ref 39.0–52.0)
Hemoglobin: 13.7 g/dL (ref 13.0–17.0)
MCH: 23.8 pg — AB (ref 26.0–34.0)
MCHC: 32.6 g/dL (ref 30.0–36.0)
MCV: 72.9 fL — ABNORMAL LOW (ref 78.0–100.0)
PLATELETS: 300 10*3/uL (ref 150–400)
RBC: 5.76 MIL/uL (ref 4.22–5.81)
RDW: 14.8 % (ref 11.5–15.5)
WBC: 5.8 10*3/uL (ref 4.0–10.5)

## 2017-10-07 LAB — I-STAT TROPONIN, ED: TROPONIN I, POC: 0 ng/mL (ref 0.00–0.08)

## 2017-10-07 NOTE — ED Notes (Signed)
Pt states to nurse first he is going home and would not be waiting any longer. Risk of leaving explained to pt and encouraged to stay. Pt ambulatory out of dept.

## 2017-10-07 NOTE — ED Notes (Signed)
Results reviewed, no changes in acuity
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
## 2017-10-31 ENCOUNTER — Other Ambulatory Visit: Payer: Self-pay | Admitting: Internal Medicine

## 2017-10-31 MED FILL — GABAPENTIN 300 MG CAPSULE: 300 | 30 days supply | Qty: 60 | Fill #1

## 2017-10-31 MED FILL — $VIAGRA 100 MG TABLET: 100 | 30 days supply | Qty: 10 | Fill #2

## 2017-10-31 MED FILL — ATORVASTATIN CALCIUM 40 MG: 40 | 30 days supply | Qty: 30 | Fill #2

## 2017-10-31 MED FILL — CARVEDILOL 6.25 MG TABLET: 6.25 | 30 days supply | Qty: 60 | Fill #0

## 2017-10-31 MED FILL — AMLODIPINE BESYLATE 10 MG T: 10 | 30 days supply | Qty: 30 | Fill #0

## 2017-11-02 ENCOUNTER — Other Ambulatory Visit: Payer: Self-pay | Admitting: *Deleted

## 2017-11-02 DIAGNOSIS — M5416 Radiculopathy, lumbar region: Secondary | ICD-10-CM

## 2017-11-09 ENCOUNTER — Encounter: Payer: Self-pay | Admitting: Internal Medicine

## 2017-11-09 ENCOUNTER — Other Ambulatory Visit: Payer: Self-pay

## 2017-11-09 ENCOUNTER — Ambulatory Visit: Payer: Medicaid Other | Attending: Internal Medicine | Admitting: Internal Medicine

## 2017-11-09 VITALS — BP 128/93 | HR 72 | Temp 98.0°F | Resp 16 | Wt 158.6 lb

## 2017-11-09 DIAGNOSIS — R05 Cough: Secondary | ICD-10-CM | POA: Diagnosis not present

## 2017-11-09 DIAGNOSIS — Z8673 Personal history of transient ischemic attack (TIA), and cerebral infarction without residual deficits: Secondary | ICD-10-CM | POA: Diagnosis not present

## 2017-11-09 DIAGNOSIS — K210 Gastro-esophageal reflux disease with esophagitis: Secondary | ICD-10-CM | POA: Diagnosis not present

## 2017-11-09 DIAGNOSIS — Z79899 Other long term (current) drug therapy: Secondary | ICD-10-CM | POA: Insufficient documentation

## 2017-11-09 DIAGNOSIS — I25118 Atherosclerotic heart disease of native coronary artery with other forms of angina pectoris: Secondary | ICD-10-CM

## 2017-11-09 DIAGNOSIS — Z7982 Long term (current) use of aspirin: Secondary | ICD-10-CM | POA: Diagnosis not present

## 2017-11-09 DIAGNOSIS — Z888 Allergy status to other drugs, medicaments and biological substances status: Secondary | ICD-10-CM | POA: Insufficient documentation

## 2017-11-09 DIAGNOSIS — K219 Gastro-esophageal reflux disease without esophagitis: Secondary | ICD-10-CM

## 2017-11-09 DIAGNOSIS — Z720 Tobacco use: Secondary | ICD-10-CM

## 2017-11-09 DIAGNOSIS — R079 Chest pain, unspecified: Secondary | ICD-10-CM | POA: Diagnosis not present

## 2017-11-09 DIAGNOSIS — E785 Hyperlipidemia, unspecified: Secondary | ICD-10-CM | POA: Diagnosis not present

## 2017-11-09 DIAGNOSIS — I252 Old myocardial infarction: Secondary | ICD-10-CM | POA: Diagnosis not present

## 2017-11-09 DIAGNOSIS — G44219 Episodic tension-type headache, not intractable: Secondary | ICD-10-CM

## 2017-11-09 DIAGNOSIS — R002 Palpitations: Secondary | ICD-10-CM | POA: Diagnosis not present

## 2017-11-09 DIAGNOSIS — I25119 Atherosclerotic heart disease of native coronary artery with unspecified angina pectoris: Secondary | ICD-10-CM | POA: Insufficient documentation

## 2017-11-09 DIAGNOSIS — E119 Type 2 diabetes mellitus without complications: Secondary | ICD-10-CM | POA: Diagnosis present

## 2017-11-09 DIAGNOSIS — Z8249 Family history of ischemic heart disease and other diseases of the circulatory system: Secondary | ICD-10-CM | POA: Diagnosis not present

## 2017-11-09 DIAGNOSIS — Z8669 Personal history of other diseases of the nervous system and sense organs: Secondary | ICD-10-CM | POA: Diagnosis not present

## 2017-11-09 DIAGNOSIS — I10 Essential (primary) hypertension: Secondary | ICD-10-CM | POA: Diagnosis present

## 2017-11-09 DIAGNOSIS — N529 Male erectile dysfunction, unspecified: Secondary | ICD-10-CM | POA: Diagnosis not present

## 2017-11-09 DIAGNOSIS — Z1211 Encounter for screening for malignant neoplasm of colon: Secondary | ICD-10-CM

## 2017-11-09 DIAGNOSIS — F1011 Alcohol abuse, in remission: Secondary | ICD-10-CM | POA: Insufficient documentation

## 2017-11-09 DIAGNOSIS — R0982 Postnasal drip: Secondary | ICD-10-CM | POA: Diagnosis not present

## 2017-11-09 DIAGNOSIS — F17210 Nicotine dependence, cigarettes, uncomplicated: Secondary | ICD-10-CM | POA: Diagnosis not present

## 2017-11-09 DIAGNOSIS — Z87442 Personal history of urinary calculi: Secondary | ICD-10-CM | POA: Diagnosis not present

## 2017-11-09 LAB — GLUCOSE, POCT (MANUAL RESULT ENTRY): POC Glucose: 96 mg/dl (ref 70–99)

## 2017-11-09 LAB — POCT GLYCOSYLATED HEMOGLOBIN (HGB A1C): HBA1C, POC (PREDIABETIC RANGE): 6.3 % (ref 5.7–6.4)

## 2017-11-09 MED ORDER — OMEPRAZOLE 40 MG PO CPDR: 40.0000 mg | DELAYED_RELEASE_CAPSULE | Freq: Every day | ORAL | 3 refills | Status: DC

## 2017-11-09 MED ORDER — ACETAMINOPHEN 325 MG PO TABS: 650.0000 mg | ORAL_TABLET | Freq: Two times a day (BID) | ORAL | 1 refills | Status: DC | PRN

## 2017-11-09 MED ORDER — ASPIRIN 81 MG PO TBEC: 81.0000 mg | DELAYED_RELEASE_TABLET | Freq: Every day | ORAL | 5 refills | Status: DC

## 2017-11-09 MED ORDER — FLUTICASONE PROPIONATE 50 MCG/ACT NA SUSP: 1.0000 | Freq: Every day | NASAL | 0 refills | Status: DC

## 2017-11-09 MED ORDER — ATORVASTATIN CALCIUM 40 MG PO TABS: 40.0000 mg | ORAL_TABLET | Freq: Every day | ORAL | 2 refills | Status: DC

## 2017-11-09 MED ORDER — NITROGLYCERIN 0.4 MG SL SUBL: 0.4000 mg | SUBLINGUAL_TABLET | SUBLINGUAL | 1 refills | Status: DC | PRN

## 2017-11-09 MED ORDER — METFORMIN HCL ER 500 MG PO TB24: 500.0000 mg | ORAL_TABLET | Freq: Every day | ORAL | 11 refills | Status: DC

## 2017-11-09 NOTE — Patient Instructions (Signed)
DO NOT take Nitroglycerine for chest pain if you have taken Viagra with in the past 24 hours.  Taking these medications together can cause your blood pressure to drop very low.
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
## 2017-11-10 LAB — MICROALBUMIN / CREATININE URINE RATIO
Creatinine, Urine: 235.1 mg/dL
MICROALB/CREAT RATIO: 3.4 mg/g{creat} (ref 0.0–30.0)
Microalbumin, Urine: 8.1 ug/mL

## 2017-11-15 ENCOUNTER — Telehealth (INDEPENDENT_AMBULATORY_CARE_PROVIDER_SITE_OTHER): Payer: Self-pay

## 2017-11-15 NOTE — Telephone Encounter (Signed)
Contacted pt to go over urine results pt didn't answer and was unable to lvm   If pt calls back please give results: no significant amount of protein in the urine.

## 2017-11-28 MED FILL — FLUTICASONE PROP 50 MCG SPR: 50 | 30 days supply | Qty: 16 | Fill #0

## 2017-11-28 MED FILL — NITROSTAT 0.4 MG TABLET SL: 0.4 | 25 days supply | Qty: 25 | Fill #0

## 2017-11-28 MED FILL — METFORMIN HCL ER 500 MG TAB: 500 | 30 days supply | Qty: 30 | Fill #0

## 2017-11-28 MED FILL — OMEPRAZOLE DR 40 MG CAPSULE: 40 | 30 days supply | Qty: 30 | Fill #0

## 2018-01-10 DIAGNOSIS — E119 Type 2 diabetes mellitus without complications: Secondary | ICD-10-CM | POA: Diagnosis not present

## 2018-01-10 DIAGNOSIS — H40013 Open angle with borderline findings, low risk, bilateral: Secondary | ICD-10-CM | POA: Diagnosis not present

## 2018-01-10 DIAGNOSIS — H2513 Age-related nuclear cataract, bilateral: Secondary | ICD-10-CM | POA: Diagnosis not present

## 2018-01-10 DIAGNOSIS — R51 Headache: Secondary | ICD-10-CM | POA: Diagnosis not present

## 2018-02-20 ENCOUNTER — Encounter (HOSPITAL_COMMUNITY): Payer: Self-pay | Admitting: *Deleted

## 2018-02-20 ENCOUNTER — Emergency Department (HOSPITAL_COMMUNITY)
Admission: EM | Admit: 2018-02-20 | Discharge: 2018-02-21 | Disposition: A | Discharge status: Home / Self Care | Payer: Medicaid Other | Attending: Emergency Medicine | Admitting: Emergency Medicine

## 2018-02-20 ENCOUNTER — Other Ambulatory Visit: Payer: Self-pay

## 2018-02-20 ENCOUNTER — Emergency Department (HOSPITAL_COMMUNITY): Payer: Medicaid Other

## 2018-02-20 DIAGNOSIS — E119 Type 2 diabetes mellitus without complications: Secondary | ICD-10-CM | POA: Insufficient documentation

## 2018-02-20 DIAGNOSIS — F17210 Nicotine dependence, cigarettes, uncomplicated: Secondary | ICD-10-CM | POA: Diagnosis not present

## 2018-02-20 DIAGNOSIS — I10 Essential (primary) hypertension: Secondary | ICD-10-CM | POA: Insufficient documentation

## 2018-02-20 DIAGNOSIS — R1013 Epigastric pain: Secondary | ICD-10-CM | POA: Diagnosis not present

## 2018-02-20 DIAGNOSIS — I252 Old myocardial infarction: Secondary | ICD-10-CM | POA: Diagnosis not present

## 2018-02-20 DIAGNOSIS — R079 Chest pain, unspecified: Secondary | ICD-10-CM | POA: Diagnosis not present

## 2018-02-20 DIAGNOSIS — Z79899 Other long term (current) drug therapy: Secondary | ICD-10-CM | POA: Insufficient documentation

## 2018-02-20 DIAGNOSIS — R0789 Other chest pain: Secondary | ICD-10-CM | POA: Insufficient documentation

## 2018-02-20 DIAGNOSIS — Z7984 Long term (current) use of oral hypoglycemic drugs: Secondary | ICD-10-CM | POA: Diagnosis not present

## 2018-02-20 DIAGNOSIS — R112 Nausea with vomiting, unspecified: Secondary | ICD-10-CM | POA: Diagnosis not present

## 2018-02-20 DIAGNOSIS — I2510 Atherosclerotic heart disease of native coronary artery without angina pectoris: Secondary | ICD-10-CM | POA: Diagnosis not present

## 2018-02-20 LAB — CBC
HCT: 44.3 % (ref 39.0–52.0)
HEMOGLOBIN: 15.2 g/dL (ref 13.0–17.0)
MCH: 24.8 pg — ABNORMAL LOW (ref 26.0–34.0)
MCHC: 34.3 g/dL (ref 30.0–36.0)
MCV: 72.1 fL — AB (ref 78.0–100.0)
Platelets: 306 10*3/uL (ref 150–400)
RBC: 6.14 MIL/uL — AB (ref 4.22–5.81)
RDW: 14.5 % (ref 11.5–15.5)
WBC: 5.3 10*3/uL (ref 4.0–10.5)

## 2018-02-20 LAB — COMPREHENSIVE METABOLIC PANEL
ALK PHOS: 82 U/L (ref 38–126)
ALT: 12 U/L (ref 0–44)
ANION GAP: 9 (ref 5–15)
AST: 17 U/L (ref 15–41)
Albumin: 4.1 g/dL (ref 3.5–5.0)
BUN: 12 mg/dL (ref 6–20)
CO2: 28 mmol/L (ref 22–32)
Calcium: 9.4 mg/dL (ref 8.9–10.3)
Chloride: 100 mmol/L (ref 98–111)
Creatinine, Ser: 1.18 mg/dL (ref 0.61–1.24)
GFR calc Af Amer: >60 mL/min (ref >60)
GFR calc non Af Amer: >60 mL/min (ref >60)
GLUCOSE: 87 mg/dL (ref 70–99)
POTASSIUM: 3.7 mmol/L (ref 3.5–5.1)
SODIUM: 137 mmol/L (ref 135–145)
Total Bilirubin: 1.2 mg/dL (ref 0.3–1.2)
Total Protein: 7.8 g/dL (ref 6.5–8.1)

## 2018-02-20 LAB — URINALYSIS, ROUTINE W REFLEX MICROSCOPIC
Bilirubin Urine: NEGATIVE
Glucose, UA: NEGATIVE mg/dL
Hgb urine dipstick: NEGATIVE
Ketones, ur: 20 mg/dL — AB
LEUKOCYTES UA: NEGATIVE
Nitrite: NEGATIVE
Protein, ur: NEGATIVE mg/dL
SPECIFIC GRAVITY, URINE: 1.012 (ref 1.005–1.030)
pH: 6 (ref 5.0–8.0)

## 2018-02-20 LAB — I-STAT TROPONIN, ED: TROPONIN I, POC: 0 ng/mL (ref 0.00–0.08)

## 2018-02-20 LAB — POC OCCULT BLOOD, ED: Fecal Occult Bld: NEGATIVE

## 2018-02-20 LAB — LIPASE, BLOOD: Lipase: 23 U/L (ref 11–51)

## 2018-02-20 MED ORDER — PANTOPRAZOLE SODIUM 40 MG PO TBEC
40.0000 mg | DELAYED_RELEASE_TABLET | Freq: Once | ORAL | Status: AC
  Administered 2018-02-20: 40 mg via ORAL
  Filled 2018-02-20: qty 1

## 2018-02-20 MED ORDER — ONDANSETRON HCL 4 MG/2ML IJ SOLN
4.0000 mg | Freq: Once | INTRAMUSCULAR | Status: AC
  Administered 2018-02-20: 4 mg via INTRAVENOUS
  Filled 2018-02-20: qty 2

## 2018-02-20 MED ORDER — PROMETHAZINE HCL 25 MG/ML IJ SOLN
25.0000 mg | Freq: Once | INTRAMUSCULAR | Status: AC
  Administered 2018-02-20: 25 mg via INTRAVENOUS
  Filled 2018-02-20: qty 1

## 2018-02-20 MED ORDER — SODIUM CHLORIDE 0.9 % IV BOLUS
1000.0000 mL | Freq: Once | INTRAVENOUS | Status: AC
  Administered 2018-02-20: 1000 mL via INTRAVENOUS

## 2018-02-20 MED ORDER — CARVEDILOL 6.25 MG PO TABS
6.2500 mg | ORAL_TABLET | Freq: Once | ORAL | Status: AC
  Administered 2018-02-20: 6.25 mg via ORAL
  Filled 2018-02-20: qty 1

## 2018-02-20 MED ORDER — PANTOPRAZOLE SODIUM 40 MG PO TBEC: 40.0000 mg | DELAYED_RELEASE_TABLET | Freq: Every day | ORAL | 0 refills | Status: DC

## 2018-02-20 MED ORDER — ONDANSETRON 4 MG PO TBDP: 4.0000 mg | ORAL_TABLET | Freq: Three times a day (TID) | ORAL | 0 refills | Status: DC | PRN

## 2018-02-20 MED ORDER — HYDROMORPHONE HCL 1 MG/ML IJ SOLN
1.0000 mg | Freq: Once | INTRAMUSCULAR | Status: AC
  Administered 2018-02-20: 1 mg via INTRAVENOUS
  Filled 2018-02-20: qty 1

## 2018-02-20 MED ORDER — GI COCKTAIL ~~LOC~~
30.0000 mL | Freq: Once | ORAL | Status: AC
  Administered 2018-02-20: 30 mL via ORAL
  Filled 2018-02-20: qty 30
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
## 2018-02-20 NOTE — ED Notes (Signed)
Patient ambulated with no assistance in hallway. Complains that he's a "little dizzy" when walking due to not eating. RN notified.

## 2018-02-20 NOTE — ED Notes (Signed)
Pt aware that urine sample is needed. Urinal at bedside 

## 2018-02-20 NOTE — ED Notes (Signed)
Pt given 4 oz of water for PO challenge 
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
## 2018-02-20 NOTE — Discharge Instructions (Signed)
Thank you for allowing me to care for you today in the Emergency Department.   You were given a dose of your home carvedilol tonight in the emergency department.  Let 1 tablet of Zofran dissolve in your tongue every 8 hours as needed for nausea or vomiting.  Take 1 tablet of Protonix daily for the next 14 days.  Please call your gastroenterologist to schedule a follow-up appointment.  Return to the emergency department if you develop vomiting despite taking Zofran, develop severe shortness of breath, if your blood pressure does not improve with your home medications, or if you develop other new, concerning symptoms.

## 2018-02-26 ENCOUNTER — Other Ambulatory Visit: Payer: Self-pay | Admitting: Internal Medicine

## 2018-02-26 DIAGNOSIS — R0982 Postnasal drip: Secondary | ICD-10-CM

## 2018-02-26 MED FILL — !VIAGRA 100 MG TABLET: 100 MG | 30 days supply | Qty: 10 | Fill #3

## 2018-02-26 MED FILL — CARVEDILOL 6.25 MG TABLET: 6.25 | 30 days supply | Qty: 60 | Fill #1

## 2018-02-26 MED FILL — AMLODIPINE BESYLATE 10 MG T: 10 | 30 days supply | Qty: 30 | Fill #1

## 2018-02-26 MED FILL — OMEPRAZOLE DR 40 MG CAPSULE: 40 | 30 days supply | Qty: 30 | Fill #1

## 2018-02-26 MED FILL — METFORMIN HCL ER 500 MG TAB: 500 | 30 days supply | Qty: 30 | Fill #1

## 2018-02-26 MED FILL — NITROSTAT 0.4 MG TABLET SL: 0.4 | 25 days supply | Qty: 25 | Fill #1

## 2018-03-22 ENCOUNTER — Encounter: Payer: Self-pay | Admitting: Internal Medicine

## 2018-03-22 ENCOUNTER — Ambulatory Visit: Payer: Medicaid Other | Attending: Internal Medicine | Admitting: Internal Medicine

## 2018-03-22 VITALS — BP 130/97 | HR 57 | Temp 97.9°F | Resp 16 | Wt 157.8 lb

## 2018-03-22 DIAGNOSIS — Z888 Allergy status to other drugs, medicaments and biological substances status: Secondary | ICD-10-CM | POA: Insufficient documentation

## 2018-03-22 DIAGNOSIS — F1021 Alcohol dependence, in remission: Secondary | ICD-10-CM | POA: Insufficient documentation

## 2018-03-22 DIAGNOSIS — F17210 Nicotine dependence, cigarettes, uncomplicated: Secondary | ICD-10-CM | POA: Insufficient documentation

## 2018-03-22 DIAGNOSIS — Z8249 Family history of ischemic heart disease and other diseases of the circulatory system: Secondary | ICD-10-CM | POA: Insufficient documentation

## 2018-03-22 DIAGNOSIS — R42 Dizziness and giddiness: Secondary | ICD-10-CM | POA: Diagnosis not present

## 2018-03-22 DIAGNOSIS — F17200 Nicotine dependence, unspecified, uncomplicated: Secondary | ICD-10-CM

## 2018-03-22 DIAGNOSIS — Z7951 Long term (current) use of inhaled steroids: Secondary | ICD-10-CM | POA: Insufficient documentation

## 2018-03-22 DIAGNOSIS — K219 Gastro-esophageal reflux disease without esophagitis: Secondary | ICD-10-CM | POA: Diagnosis not present

## 2018-03-22 DIAGNOSIS — I252 Old myocardial infarction: Secondary | ICD-10-CM | POA: Diagnosis not present

## 2018-03-22 DIAGNOSIS — I25118 Atherosclerotic heart disease of native coronary artery with other forms of angina pectoris: Secondary | ICD-10-CM | POA: Diagnosis not present

## 2018-03-22 DIAGNOSIS — Z7982 Long term (current) use of aspirin: Secondary | ICD-10-CM | POA: Insufficient documentation

## 2018-03-22 DIAGNOSIS — R109 Unspecified abdominal pain: Secondary | ICD-10-CM | POA: Diagnosis not present

## 2018-03-22 DIAGNOSIS — Z7984 Long term (current) use of oral hypoglycemic drugs: Secondary | ICD-10-CM | POA: Diagnosis not present

## 2018-03-22 DIAGNOSIS — E119 Type 2 diabetes mellitus without complications: Secondary | ICD-10-CM

## 2018-03-22 DIAGNOSIS — Z23 Encounter for immunization: Secondary | ICD-10-CM

## 2018-03-22 DIAGNOSIS — E785 Hyperlipidemia, unspecified: Secondary | ICD-10-CM | POA: Diagnosis not present

## 2018-03-22 DIAGNOSIS — H409 Unspecified glaucoma: Secondary | ICD-10-CM | POA: Diagnosis not present

## 2018-03-22 DIAGNOSIS — Z79899 Other long term (current) drug therapy: Secondary | ICD-10-CM | POA: Diagnosis not present

## 2018-03-22 DIAGNOSIS — Z800 Family history of malignant neoplasm of digestive organs: Secondary | ICD-10-CM | POA: Insufficient documentation

## 2018-03-22 DIAGNOSIS — I10 Essential (primary) hypertension: Secondary | ICD-10-CM

## 2018-03-22 DIAGNOSIS — Z87442 Personal history of urinary calculi: Secondary | ICD-10-CM | POA: Diagnosis not present

## 2018-03-22 LAB — POCT GLYCOSYLATED HEMOGLOBIN (HGB A1C): HBA1C, POC (PREDIABETIC RANGE): 5.8 % (ref 5.7–6.4)

## 2018-03-22 LAB — GLUCOSE, POCT (MANUAL RESULT ENTRY): POC GLUCOSE: 97 mg/dL (ref 70–99)

## 2018-03-22 MED ORDER — CARVEDILOL 6.25 MG PO TABS: 6.2500 mg | ORAL_TABLET | Freq: Two times a day (BID) | ORAL | 5 refills | Status: DC

## 2018-03-22 MED ORDER — NICOTINE 14 MG/24HR TD PT24: 14.0000 mg | MEDICATED_PATCH | Freq: Every day | TRANSDERMAL | 0 refills | Status: DC

## 2018-03-22 MED ORDER — GABAPENTIN 300 MG PO CAPS: 300.0000 mg | ORAL_CAPSULE | Freq: Every day | ORAL | 2 refills | Status: DC

## 2018-03-22 MED ORDER — METFORMIN HCL ER 500 MG PO TB24: 500.0000 mg | ORAL_TABLET | Freq: Every day | ORAL | 11 refills | Status: DC

## 2018-03-22 MED ORDER — OMEPRAZOLE 40 MG PO CPDR: 40.0000 mg | DELAYED_RELEASE_CAPSULE | Freq: Every day | ORAL | 3 refills | Status: DC

## 2018-03-22 MED ORDER — AMLODIPINE BESYLATE 10 MG PO TABS: 10.0000 mg | ORAL_TABLET | Freq: Every day | ORAL | 6 refills | Status: DC

## 2018-03-22 MED ORDER — ATORVASTATIN CALCIUM 40 MG PO TABS: 40.0000 mg | ORAL_TABLET | Freq: Every day | ORAL | 2 refills | Status: DC

## 2018-03-22 NOTE — Progress Notes (Signed)
Pt states he is having pain on his left side   Pt is taking omeprazole and Protonix
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
## 2018-03-22 NOTE — Patient Instructions (Addendum)
Try to eat your 3 meals a day.  Can supplement meals with Glucerna shakes.    Influenza Virus Vaccine injection (Fluarix) What is this medicine? INFLUENZA VIRUS VACCINE (in floo EN zuh VAHY ruhs vak SEEN) helps to reduce the risk of getting influenza also known as the flu. This medicine may be used for other purposes; ask your health care provider or pharmacist if you have questions. COMMON BRAND NAME(S): Fluarix, Fluzone What should I tell my health care provider before I take this medicine? They need to know if you have any of these conditions: -bleeding disorder like hemophilia -fever or infection -Guillain-Barre syndrome or other neurological problems -immune system problems -infection with the human immunodeficiency virus (HIV) or AIDS -low blood platelet counts -multiple sclerosis -an unusual or allergic reaction to influenza virus vaccine, eggs, chicken proteins, latex, gentamicin, other medicines, foods, dyes or preservatives -pregnant or trying to get pregnant -breast-feeding How should I use this medicine? This vaccine is for injection into a muscle. It is given by a health care professional. A copy of Vaccine Information Statements will be given before each vaccination. Read this sheet carefully each time. The sheet may change frequently. Talk to your pediatrician regarding the use of this medicine in children. Special care may be needed. Overdosage: If you think you have taken too much of this medicine contact a poison control center or emergency room at once. NOTE: This medicine is only for you. Do not share this medicine with others. What if I miss a dose? This does not apply. What may interact with this medicine? -chemotherapy or radiation therapy -medicines that lower your immune system like etanercept, anakinra, infliximab, and adalimumab -medicines that treat or prevent blood clots like warfarin -phenytoin -steroid medicines like prednisone or  cortisone -theophylline -vaccines This list may not describe all possible interactions. Give your health care provider a list of all the medicines, herbs, non-prescription drugs, or dietary supplements you use. Also tell them if you smoke, drink alcohol, or use illegal drugs. Some items may interact with your medicine. What should I watch for while using this medicine? Report any side effects that do not go away within 3 days to your doctor or health care professional. Call your health care provider if any unusual symptoms occur within 6 weeks of receiving this vaccine. You may still catch the flu, but the illness is not usually as bad. You cannot get the flu from the vaccine. The vaccine will not protect against colds or other illnesses that may cause fever. The vaccine is needed every year. What side effects may I notice from receiving this medicine? Side effects that you should report to your doctor or health care professional as soon as possible: -allergic reactions like skin rash, itching or hives, swelling of the face, lips, or tongue Side effects that usually do not require medical attention (report to your doctor or health care professional if they continue or are bothersome): -fever -headache -muscle aches and pains -pain, tenderness, redness, or swelling at site where injected -weak or tired This list may not describe all possible side effects. Call your doctor for medical advice about side effects. You may report side effects to FDA at 1-800-FDA-1088. Where should I keep my medicine? This vaccine is only given in a clinic, pharmacy, doctor's office, or other health care setting and will not be stored at home. NOTE: This sheet is a summary. It may not cover all possible information. If you have questions about this medicine, talk to  your doctor, pharmacist, or health care provider.  2018 Elsevier/Gold Standard (2007-12-05 09:30:40)

## 2018-05-25 ENCOUNTER — Ambulatory Visit: Payer: Self-pay | Admitting: Internal Medicine

## 2018-05-27 ENCOUNTER — Emergency Department (HOSPITAL_COMMUNITY): Payer: Medicaid Other

## 2018-05-27 ENCOUNTER — Other Ambulatory Visit: Payer: Self-pay

## 2018-05-27 ENCOUNTER — Inpatient Hospital Stay (HOSPITAL_COMMUNITY)
Admission: EM | Admit: 2018-05-27 | Discharge: 2018-05-30 | DRG: 392 | Disposition: A | Discharge status: Home / Self Care | Payer: Medicaid Other | Attending: Internal Medicine | Admitting: Internal Medicine

## 2018-05-27 ENCOUNTER — Encounter (HOSPITAL_COMMUNITY): Payer: Self-pay

## 2018-05-27 DIAGNOSIS — F17210 Nicotine dependence, cigarettes, uncomplicated: Secondary | ICD-10-CM | POA: Diagnosis present

## 2018-05-27 DIAGNOSIS — E875 Hyperkalemia: Secondary | ICD-10-CM | POA: Diagnosis present

## 2018-05-27 DIAGNOSIS — Z823 Family history of stroke: Secondary | ICD-10-CM

## 2018-05-27 DIAGNOSIS — Z800 Family history of malignant neoplasm of digestive organs: Secondary | ICD-10-CM

## 2018-05-27 DIAGNOSIS — H409 Unspecified glaucoma: Secondary | ICD-10-CM | POA: Diagnosis present

## 2018-05-27 DIAGNOSIS — Z7951 Long term (current) use of inhaled steroids: Secondary | ICD-10-CM

## 2018-05-27 DIAGNOSIS — E785 Hyperlipidemia, unspecified: Secondary | ICD-10-CM | POA: Diagnosis present

## 2018-05-27 DIAGNOSIS — I10 Essential (primary) hypertension: Secondary | ICD-10-CM | POA: Diagnosis not present

## 2018-05-27 DIAGNOSIS — Z8711 Personal history of peptic ulcer disease: Secondary | ICD-10-CM

## 2018-05-27 DIAGNOSIS — I252 Old myocardial infarction: Secondary | ICD-10-CM

## 2018-05-27 DIAGNOSIS — Z8249 Family history of ischemic heart disease and other diseases of the circulatory system: Secondary | ICD-10-CM

## 2018-05-27 DIAGNOSIS — R0789 Other chest pain: Secondary | ICD-10-CM | POA: Diagnosis not present

## 2018-05-27 DIAGNOSIS — E1136 Type 2 diabetes mellitus with diabetic cataract: Secondary | ICD-10-CM | POA: Diagnosis present

## 2018-05-27 DIAGNOSIS — R0902 Hypoxemia: Secondary | ICD-10-CM | POA: Diagnosis not present

## 2018-05-27 DIAGNOSIS — K210 Gastro-esophageal reflux disease with esophagitis: Secondary | ICD-10-CM | POA: Diagnosis present

## 2018-05-27 DIAGNOSIS — R079 Chest pain, unspecified: Secondary | ICD-10-CM | POA: Diagnosis not present

## 2018-05-27 DIAGNOSIS — R109 Unspecified abdominal pain: Secondary | ICD-10-CM

## 2018-05-27 DIAGNOSIS — F419 Anxiety disorder, unspecified: Secondary | ICD-10-CM | POA: Diagnosis present

## 2018-05-27 DIAGNOSIS — K5730 Diverticulosis of large intestine without perforation or abscess without bleeding: Secondary | ICD-10-CM | POA: Diagnosis not present

## 2018-05-27 DIAGNOSIS — I160 Hypertensive urgency: Secondary | ICD-10-CM | POA: Diagnosis not present

## 2018-05-27 DIAGNOSIS — R112 Nausea with vomiting, unspecified: Secondary | ICD-10-CM | POA: Diagnosis present

## 2018-05-27 DIAGNOSIS — F10239 Alcohol dependence with withdrawal, unspecified: Secondary | ICD-10-CM | POA: Diagnosis present

## 2018-05-27 DIAGNOSIS — Z8673 Personal history of transient ischemic attack (TIA), and cerebral infarction without residual deficits: Secondary | ICD-10-CM

## 2018-05-27 DIAGNOSIS — Z87828 Personal history of other (healed) physical injury and trauma: Secondary | ICD-10-CM

## 2018-05-27 DIAGNOSIS — Z7982 Long term (current) use of aspirin: Secondary | ICD-10-CM

## 2018-05-27 DIAGNOSIS — Z7984 Long term (current) use of oral hypoglycemic drugs: Secondary | ICD-10-CM

## 2018-05-27 DIAGNOSIS — K2920 Alcoholic gastritis without bleeding: Principal | ICD-10-CM | POA: Diagnosis present

## 2018-05-27 DIAGNOSIS — R0602 Shortness of breath: Secondary | ICD-10-CM | POA: Diagnosis not present

## 2018-05-27 DIAGNOSIS — R42 Dizziness and giddiness: Secondary | ICD-10-CM | POA: Diagnosis not present

## 2018-05-27 DIAGNOSIS — Z87442 Personal history of urinary calculi: Secondary | ICD-10-CM

## 2018-05-27 DIAGNOSIS — I2510 Atherosclerotic heart disease of native coronary artery without angina pectoris: Secondary | ICD-10-CM | POA: Diagnosis present

## 2018-05-27 DIAGNOSIS — G8929 Other chronic pain: Secondary | ICD-10-CM | POA: Diagnosis present

## 2018-05-27 DIAGNOSIS — Z79899 Other long term (current) drug therapy: Secondary | ICD-10-CM

## 2018-05-27 DIAGNOSIS — Z888 Allergy status to other drugs, medicaments and biological substances status: Secondary | ICD-10-CM

## 2018-05-27 DIAGNOSIS — R1110 Vomiting, unspecified: Secondary | ICD-10-CM | POA: Diagnosis present

## 2018-05-27 LAB — CBC WITH DIFFERENTIAL/PLATELET
Abs Immature Granulocytes: 0.01 10*3/uL (ref 0.00–0.07)
BASOS PCT: 0 %
Basophils Absolute: 0 10*3/uL (ref 0.0–0.1)
EOS ABS: 0.1 10*3/uL (ref 0.0–0.5)
EOS PCT: 1 %
HEMATOCRIT: 42.6 % (ref 39.0–52.0)
Hemoglobin: 14 g/dL (ref 13.0–17.0)
Immature Granulocytes: 0 %
Lymphocytes Relative: 25 %
Lymphs Abs: 1.7 10*3/uL (ref 0.7–4.0)
MCH: 24 pg — ABNORMAL LOW (ref 26.0–34.0)
MCHC: 32.9 g/dL (ref 30.0–36.0)
MCV: 72.9 fL — ABNORMAL LOW (ref 80.0–100.0)
Monocytes Absolute: 0.5 10*3/uL (ref 0.1–1.0)
Monocytes Relative: 7 %
NEUTROS PCT: 67 %
Neutro Abs: 4.5 10*3/uL (ref 1.7–7.7)
PLATELETS: 450 10*3/uL — AB (ref 150–400)
RBC: 5.84 MIL/uL — AB (ref 4.22–5.81)
RDW: 15.2 % (ref 11.5–15.5)
WBC: 6.6 10*3/uL (ref 4.0–10.5)
nRBC: 0 % (ref 0.0–0.2)

## 2018-05-27 LAB — CBC
HCT: 39 % (ref 39.0–52.0)
Hemoglobin: 12.9 g/dL — ABNORMAL LOW (ref 13.0–17.0)
MCH: 24.1 pg — ABNORMAL LOW (ref 26.0–34.0)
MCHC: 33.1 g/dL (ref 30.0–36.0)
MCV: 72.8 fL — ABNORMAL LOW (ref 80.0–100.0)
Platelets: 338 K/uL (ref 150–400)
RBC: 5.36 MIL/uL (ref 4.22–5.81)
RDW: 14.8 % (ref 11.5–15.5)
WBC: 6.5 K/uL (ref 4.0–10.5)
nRBC: 0 % (ref 0.0–0.2)

## 2018-05-27 LAB — COMPREHENSIVE METABOLIC PANEL
ALT: 25 U/L (ref 0–44)
AST: 50 U/L — ABNORMAL HIGH (ref 15–41)
Albumin: 4 g/dL (ref 3.5–5.0)
Alkaline Phosphatase: 93 U/L (ref 38–126)
Anion gap: 11 (ref 5–15)
BILIRUBIN TOTAL: 1.8 mg/dL — AB (ref 0.3–1.2)
BUN: 8 mg/dL (ref 8–23)
CALCIUM: 8.6 mg/dL — AB (ref 8.9–10.3)
CO2: 25 mmol/L (ref 22–32)
CREATININE: 1.09 mg/dL (ref 0.61–1.24)
Chloride: 100 mmol/L (ref 98–111)
Glucose, Bld: 97 mg/dL (ref 70–99)
Potassium: 6.2 mmol/L — ABNORMAL HIGH (ref 3.5–5.1)
SODIUM: 136 mmol/L (ref 135–145)
TOTAL PROTEIN: 8 g/dL (ref 6.5–8.1)

## 2018-05-27 LAB — PROTIME-INR
INR: 0.85
PROTHROMBIN TIME: 11.5 s (ref 11.4–15.2)

## 2018-05-27 LAB — POTASSIUM: Potassium: 3.3 mmol/L — ABNORMAL LOW (ref 3.5–5.1)

## 2018-05-27 LAB — INFLUENZA PANEL BY PCR (TYPE A & B)
Influenza A By PCR: NEGATIVE
Influenza B By PCR: NEGATIVE

## 2018-05-27 LAB — CREATININE, SERUM
Creatinine, Ser: 0.75 mg/dL (ref 0.61–1.24)
GFR calc Af Amer: >60 mL/min
GFR calc non Af Amer: >60 mL/min

## 2018-05-27 LAB — TROPONIN I: Troponin I: <0.03 ng/mL (ref <0.03)

## 2018-05-27 MED ORDER — FENTANYL CITRATE (PF) 100 MCG/2ML IJ SOLN
12.5000 ug | Freq: Once | INTRAMUSCULAR | Status: AC
  Administered 2018-05-27: 12.5 ug via INTRAVENOUS
  Filled 2018-05-27: qty 2

## 2018-05-27 MED ORDER — GABAPENTIN 300 MG PO CAPS
300.0000 mg | ORAL_CAPSULE | Freq: Every day | ORAL | Status: DC
  Administered 2018-05-27 – 2018-05-28 (×2): 300 mg via ORAL
  Filled 2018-05-27 (×2): qty 1

## 2018-05-27 MED ORDER — FOLIC ACID 1 MG PO TABS
1.0000 mg | ORAL_TABLET | Freq: Every day | ORAL | Status: DC
  Administered 2018-05-28 – 2018-05-30 (×3): 1 mg via ORAL
  Filled 2018-05-27 (×3): qty 1

## 2018-05-27 MED ORDER — IOPAMIDOL (ISOVUE-300) INJECTION 61%
100.0000 mL | Freq: Once | INTRAVENOUS | Status: AC | PRN
  Administered 2018-05-27: 100 mL via INTRAVENOUS

## 2018-05-27 MED ORDER — ONDANSETRON HCL 4 MG/2ML IJ SOLN
4.0000 mg | Freq: Once | INTRAMUSCULAR | Status: AC
  Administered 2018-05-27: 4 mg via INTRAVENOUS
  Filled 2018-05-27: qty 2

## 2018-05-27 MED ORDER — LABETALOL HCL 5 MG/ML IV SOLN
10.0000 mg | INTRAVENOUS | Status: DC | PRN
  Administered 2018-05-29: 10 mg via INTRAVENOUS
  Filled 2018-05-27 (×2): qty 4

## 2018-05-27 MED ORDER — ASPIRIN EC 81 MG PO TBEC
81.0000 mg | DELAYED_RELEASE_TABLET | Freq: Every day | ORAL | Status: DC
  Administered 2018-05-28 – 2018-05-30 (×3): 81 mg via ORAL
  Filled 2018-05-27 (×3): qty 1

## 2018-05-27 MED ORDER — CARVEDILOL 6.25 MG PO TABS
6.2500 mg | ORAL_TABLET | Freq: Two times a day (BID) | ORAL | Status: DC
  Administered 2018-05-27 – 2018-05-29 (×4): 6.25 mg via ORAL
  Filled 2018-05-27 (×4): qty 1

## 2018-05-27 MED ORDER — SODIUM CHLORIDE (PF) 0.9 % IJ SOLN
INTRAMUSCULAR | Status: AC
  Filled 2018-05-27: qty 50

## 2018-05-27 MED ORDER — SODIUM CHLORIDE 0.9 % IV BOLUS
1000.0000 mL | Freq: Once | INTRAVENOUS | Status: AC
  Administered 2018-05-27: 1000 mL via INTRAVENOUS

## 2018-05-27 MED ORDER — VITAMIN B-1 100 MG PO TABS
100.0000 mg | ORAL_TABLET | Freq: Every day | ORAL | Status: DC
  Administered 2018-05-28 – 2018-05-30 (×3): 100 mg via ORAL
  Filled 2018-05-27 (×3): qty 1

## 2018-05-27 MED ORDER — ENOXAPARIN SODIUM 40 MG/0.4ML ~~LOC~~ SOLN
40.0000 mg | SUBCUTANEOUS | Status: DC
  Administered 2018-05-28 – 2018-05-30 (×3): 40 mg via SUBCUTANEOUS
  Filled 2018-05-27 (×3): qty 0.4

## 2018-05-27 MED ORDER — ONDANSETRON 4 MG PO TBDP
4.0000 mg | ORAL_TABLET | Freq: Three times a day (TID) | ORAL | Status: AC
  Administered 2018-05-27 – 2018-05-28 (×2): 4 mg via ORAL
  Filled 2018-05-27 (×2): qty 1

## 2018-05-27 MED ORDER — LORAZEPAM 2 MG/ML IJ SOLN
1.0000 mg | Freq: Four times a day (QID) | INTRAMUSCULAR | Status: DC | PRN
  Administered 2018-05-28: 1 mg via INTRAVENOUS
  Filled 2018-05-27: qty 1

## 2018-05-27 MED ORDER — FENTANYL CITRATE (PF) 100 MCG/2ML IJ SOLN
25.0000 ug | Freq: Once | INTRAMUSCULAR | Status: AC
  Administered 2018-05-27: 25 ug via INTRAVENOUS
  Filled 2018-05-27: qty 2

## 2018-05-27 MED ORDER — LORAZEPAM 1 MG PO TABS
0.0000 mg | ORAL_TABLET | Freq: Three times a day (TID) | ORAL | Status: AC
  Administered 2018-05-27 – 2018-05-28 (×2): 1 mg via ORAL
  Administered 2018-05-28: 0 mg via ORAL
  Administered 2018-05-29 (×2): 1 mg via ORAL
  Filled 2018-05-27 (×2): qty 1

## 2018-05-27 MED ORDER — HYDRALAZINE HCL 20 MG/ML IJ SOLN
10.0000 mg | INTRAMUSCULAR | Status: AC
  Administered 2018-05-27: 10 mg via INTRAVENOUS
  Filled 2018-05-27: qty 1

## 2018-05-27 MED ORDER — IOPAMIDOL (ISOVUE-300) INJECTION 61%
INTRAVENOUS | Status: AC
  Filled 2018-05-27: qty 100

## 2018-05-27 MED ORDER — ALBUTEROL SULFATE (2.5 MG/3ML) 0.083% IN NEBU
5.0000 mg | INHALATION_SOLUTION | Freq: Once | RESPIRATORY_TRACT | Status: AC
  Administered 2018-05-27: 5 mg via RESPIRATORY_TRACT
  Filled 2018-05-27: qty 6

## 2018-05-27 MED ORDER — AMLODIPINE BESYLATE 10 MG PO TABS
10.0000 mg | ORAL_TABLET | Freq: Every day | ORAL | Status: DC
  Administered 2018-05-27 – 2018-05-30 (×4): 10 mg via ORAL
  Filled 2018-05-27 (×4): qty 1

## 2018-05-27 MED ORDER — LORAZEPAM 1 MG PO TABS
1.0000 mg | ORAL_TABLET | Freq: Four times a day (QID) | ORAL | Status: DC | PRN
  Administered 2018-05-28 – 2018-05-30 (×3): 1 mg via ORAL
  Filled 2018-05-27 (×4): qty 1

## 2018-05-27 MED ORDER — ALUM & MAG HYDROXIDE-SIMETH 200-200-20 MG/5ML PO SUSP
15.0000 mL | Freq: Once | ORAL | Status: AC
  Administered 2018-05-27: 15 mL via ORAL
  Filled 2018-05-27: qty 30

## 2018-05-27 MED ORDER — ATORVASTATIN CALCIUM 40 MG PO TABS
40.0000 mg | ORAL_TABLET | Freq: Every day | ORAL | Status: DC
  Administered 2018-05-27 – 2018-05-29 (×3): 40 mg via ORAL
  Filled 2018-05-27 (×3): qty 1

## 2018-05-27 MED ORDER — FENTANYL CITRATE (PF) 100 MCG/2ML IJ SOLN
25.0000 ug | INTRAMUSCULAR | Status: DC | PRN
  Administered 2018-05-27 – 2018-05-28 (×5): 25 ug via INTRAVENOUS
  Filled 2018-05-27 (×5): qty 2

## 2018-05-27 MED ORDER — ONDANSETRON HCL 4 MG/2ML IJ SOLN: 4.0000 mg | Freq: Four times a day (QID) | INTRAMUSCULAR | Status: DC | PRN

## 2018-05-27 MED ORDER — METOCLOPRAMIDE HCL 5 MG/ML IJ SOLN
20.0000 mg | Freq: Once | INTRAVENOUS | Status: AC
  Administered 2018-05-27: 20 mg via INTRAVENOUS
  Filled 2018-05-27: qty 4

## 2018-05-27 MED ORDER — MORPHINE SULFATE (PF) 2 MG/ML IV SOLN
2.0000 mg | INTRAVENOUS | Status: DC | PRN
  Administered 2018-05-27: 2 mg via INTRAVENOUS
  Filled 2018-05-27: qty 1

## 2018-05-27 MED ORDER — FLUTICASONE PROPIONATE 50 MCG/ACT NA SUSP
1.0000 | Freq: Every day | NASAL | Status: DC
  Administered 2018-05-28 – 2018-05-30 (×3): 1 via NASAL
  Filled 2018-05-27: qty 16

## 2018-05-27 MED ORDER — OXYCODONE-ACETAMINOPHEN 5-325 MG PO TABS
1.0000 | ORAL_TABLET | ORAL | Status: DC | PRN
  Administered 2018-05-28 – 2018-05-30 (×13): 1 via ORAL
  Filled 2018-05-27 (×13): qty 1

## 2018-05-27 MED ORDER — NITROGLYCERIN 0.4 MG SL SUBL
0.4000 mg | SUBLINGUAL_TABLET | SUBLINGUAL | Status: DC | PRN
  Administered 2018-05-27 (×3): 0.4 mg via SUBLINGUAL
  Filled 2018-05-27: qty 1

## 2018-05-27 MED ORDER — LORAZEPAM 1 MG PO TABS
0.0000 mg | ORAL_TABLET | Freq: Two times a day (BID) | ORAL | Status: DC
  Administered 2018-05-29: 1 mg via ORAL
  Filled 2018-05-27 (×2): qty 1

## 2018-05-27 MED ORDER — PANTOPRAZOLE SODIUM 40 MG PO TBEC
40.0000 mg | DELAYED_RELEASE_TABLET | Freq: Every day | ORAL | Status: DC
  Administered 2018-05-27 – 2018-05-30 (×4): 40 mg via ORAL
  Filled 2018-05-27 (×4): qty 1

## 2018-05-27 NOTE — ED Notes (Signed)
Bed: JK93 Expected date:  Expected time:  Means of arrival:  Comments: Chest pain, ETOH refused to go to [facility]
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
## 2018-05-27 NOTE — ED Triage Notes (Signed)
EMS reports from home, c/o SOB, dizziness and chest pain with admitted ETOH use, 2 quarts of beer today. States sharp pain over right side of chest. Hx of MI, hypertension and diabetes.   BP 139/103 HR 100 RR 16 Sp02 97 RA CBG 106  18ga LAC 324 ASA given enroute 1- .04 nitro with no improvement
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
## 2018-05-28 ENCOUNTER — Other Ambulatory Visit: Payer: Self-pay

## 2018-05-28 DIAGNOSIS — R112 Nausea with vomiting, unspecified: Secondary | ICD-10-CM | POA: Diagnosis not present

## 2018-05-28 LAB — CBC
HEMATOCRIT: 38.6 % — AB (ref 39.0–52.0)
Hemoglobin: 12.6 g/dL — ABNORMAL LOW (ref 13.0–17.0)
MCH: 24.1 pg — ABNORMAL LOW (ref 26.0–34.0)
MCHC: 32.6 g/dL (ref 30.0–36.0)
MCV: 73.9 fL — ABNORMAL LOW (ref 80.0–100.0)
Platelets: 307 10*3/uL (ref 150–400)
RBC: 5.22 MIL/uL (ref 4.22–5.81)
RDW: 14.8 % (ref 11.5–15.5)
WBC: 6.2 10*3/uL (ref 4.0–10.5)
nRBC: 0 % (ref 0.0–0.2)

## 2018-05-28 LAB — BASIC METABOLIC PANEL
Anion gap: 10 (ref 5–15)
BUN: 6 mg/dL — ABNORMAL LOW (ref 8–23)
CO2: 23 mmol/L (ref 22–32)
Calcium: 8.2 mg/dL — ABNORMAL LOW (ref 8.9–10.3)
Chloride: 105 mmol/L (ref 98–111)
Creatinine, Ser: 0.92 mg/dL (ref 0.61–1.24)
GFR calc Af Amer: >60 mL/min (ref >60)
GFR calc non Af Amer: >60 mL/min (ref >60)
Glucose, Bld: 83 mg/dL (ref 70–99)
Potassium: 3.7 mmol/L (ref 3.5–5.1)
Sodium: 138 mmol/L (ref 135–145)

## 2018-05-28 LAB — TROPONIN I: Troponin I: <0.03 ng/mL (ref <0.03)

## 2018-05-28 LAB — MAGNESIUM: Magnesium: 1.9 mg/dL (ref 1.7–2.4)
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
## 2018-05-29 DIAGNOSIS — K210 Gastro-esophageal reflux disease with esophagitis: Secondary | ICD-10-CM | POA: Diagnosis present

## 2018-05-29 DIAGNOSIS — K2920 Alcoholic gastritis without bleeding: Secondary | ICD-10-CM

## 2018-05-29 DIAGNOSIS — Z7984 Long term (current) use of oral hypoglycemic drugs: Secondary | ICD-10-CM | POA: Diagnosis not present

## 2018-05-29 DIAGNOSIS — Z7982 Long term (current) use of aspirin: Secondary | ICD-10-CM | POA: Diagnosis not present

## 2018-05-29 DIAGNOSIS — F10239 Alcohol dependence with withdrawal, unspecified: Secondary | ICD-10-CM | POA: Diagnosis present

## 2018-05-29 DIAGNOSIS — E785 Hyperlipidemia, unspecified: Secondary | ICD-10-CM | POA: Diagnosis present

## 2018-05-29 DIAGNOSIS — I252 Old myocardial infarction: Secondary | ICD-10-CM | POA: Diagnosis not present

## 2018-05-29 DIAGNOSIS — G8929 Other chronic pain: Secondary | ICD-10-CM | POA: Diagnosis present

## 2018-05-29 DIAGNOSIS — Z8711 Personal history of peptic ulcer disease: Secondary | ICD-10-CM | POA: Diagnosis not present

## 2018-05-29 DIAGNOSIS — Z8673 Personal history of transient ischemic attack (TIA), and cerebral infarction without residual deficits: Secondary | ICD-10-CM | POA: Diagnosis not present

## 2018-05-29 DIAGNOSIS — I10 Essential (primary) hypertension: Secondary | ICD-10-CM | POA: Diagnosis present

## 2018-05-29 DIAGNOSIS — Z7951 Long term (current) use of inhaled steroids: Secondary | ICD-10-CM | POA: Diagnosis not present

## 2018-05-29 DIAGNOSIS — Z800 Family history of malignant neoplasm of digestive organs: Secondary | ICD-10-CM | POA: Diagnosis not present

## 2018-05-29 DIAGNOSIS — R112 Nausea with vomiting, unspecified: Secondary | ICD-10-CM | POA: Diagnosis not present

## 2018-05-29 DIAGNOSIS — E875 Hyperkalemia: Secondary | ICD-10-CM | POA: Diagnosis present

## 2018-05-29 DIAGNOSIS — I2510 Atherosclerotic heart disease of native coronary artery without angina pectoris: Secondary | ICD-10-CM | POA: Diagnosis present

## 2018-05-29 DIAGNOSIS — H409 Unspecified glaucoma: Secondary | ICD-10-CM | POA: Diagnosis present

## 2018-05-29 DIAGNOSIS — E1136 Type 2 diabetes mellitus with diabetic cataract: Secondary | ICD-10-CM | POA: Diagnosis present

## 2018-05-29 DIAGNOSIS — R1013 Epigastric pain: Secondary | ICD-10-CM | POA: Diagnosis not present

## 2018-05-29 DIAGNOSIS — F17210 Nicotine dependence, cigarettes, uncomplicated: Secondary | ICD-10-CM | POA: Diagnosis present

## 2018-05-29 DIAGNOSIS — Z87442 Personal history of urinary calculi: Secondary | ICD-10-CM | POA: Diagnosis not present

## 2018-05-29 DIAGNOSIS — Z8249 Family history of ischemic heart disease and other diseases of the circulatory system: Secondary | ICD-10-CM | POA: Diagnosis not present

## 2018-05-29 DIAGNOSIS — Z823 Family history of stroke: Secondary | ICD-10-CM | POA: Diagnosis not present

## 2018-05-29 DIAGNOSIS — Z87828 Personal history of other (healed) physical injury and trauma: Secondary | ICD-10-CM | POA: Diagnosis not present

## 2018-05-29 DIAGNOSIS — F419 Anxiety disorder, unspecified: Secondary | ICD-10-CM | POA: Diagnosis present

## 2018-05-29 DIAGNOSIS — Z79899 Other long term (current) drug therapy: Secondary | ICD-10-CM | POA: Diagnosis not present

## 2018-05-29 HISTORY — DX: Alcoholic gastritis without bleeding: K29.20

## 2018-05-29 LAB — BASIC METABOLIC PANEL
Anion gap: 8 (ref 5–15)
BUN: 11 mg/dL (ref 8–23)
CHLORIDE: 105 mmol/L (ref 98–111)
CO2: 23 mmol/L (ref 22–32)
Calcium: 8.7 mg/dL — ABNORMAL LOW (ref 8.9–10.3)
Creatinine, Ser: 0.94 mg/dL (ref 0.61–1.24)
GFR calc Af Amer: >60 mL/min (ref >60)
GFR calc non Af Amer: >60 mL/min (ref >60)
Glucose, Bld: 103 mg/dL — ABNORMAL HIGH (ref 70–99)
Potassium: 3.8 mmol/L (ref 3.5–5.1)
Sodium: 136 mmol/L (ref 135–145)

## 2018-05-29 LAB — CBC
HCT: 40.8 % (ref 39.0–52.0)
Hemoglobin: 13.2 g/dL (ref 13.0–17.0)
MCH: 24.1 pg — ABNORMAL LOW (ref 26.0–34.0)
MCHC: 32.4 g/dL (ref 30.0–36.0)
MCV: 74.5 fL — ABNORMAL LOW (ref 80.0–100.0)
Platelets: 304 10*3/uL (ref 150–400)
RBC: 5.48 MIL/uL (ref 4.22–5.81)
RDW: 14.8 % (ref 11.5–15.5)
WBC: 4.8 10*3/uL (ref 4.0–10.5)
nRBC: 0 % (ref 0.0–0.2)

## 2018-05-29 LAB — MAGNESIUM: Magnesium: 2 mg/dL (ref 1.7–2.4)

## 2018-05-29 MED ORDER — CARVEDILOL 12.5 MG PO TABS
12.5000 mg | ORAL_TABLET | Freq: Two times a day (BID) | ORAL | Status: DC
  Administered 2018-05-29 – 2018-05-30 (×2): 12.5 mg via ORAL
  Filled 2018-05-29 (×2): qty 1

## 2018-05-29 MED ORDER — GABAPENTIN 300 MG PO CAPS
300.0000 mg | ORAL_CAPSULE | Freq: Three times a day (TID) | ORAL | Status: DC
  Administered 2018-05-29 – 2018-05-30 (×4): 300 mg via ORAL
  Filled 2018-05-29 (×4): qty 1
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
## 2018-05-30 DIAGNOSIS — K2920 Alcoholic gastritis without bleeding: Principal | ICD-10-CM

## 2018-05-30 LAB — BASIC METABOLIC PANEL
Anion gap: 8 (ref 5–15)
BUN: 10 mg/dL (ref 8–23)
CO2: 26 mmol/L (ref 22–32)
Calcium: 8.9 mg/dL (ref 8.9–10.3)
Chloride: 105 mmol/L (ref 98–111)
Creatinine, Ser: 1.05 mg/dL (ref 0.61–1.24)
GFR calc Af Amer: >60 mL/min (ref >60)
GFR calc non Af Amer: >60 mL/min (ref >60)
Glucose, Bld: 106 mg/dL — ABNORMAL HIGH (ref 70–99)
Potassium: 4 mmol/L (ref 3.5–5.1)
Sodium: 139 mmol/L (ref 135–145)

## 2018-05-30 LAB — CBC
HEMATOCRIT: 39.6 % (ref 39.0–52.0)
Hemoglobin: 12.7 g/dL — ABNORMAL LOW (ref 13.0–17.0)
MCH: 23.8 pg — ABNORMAL LOW (ref 26.0–34.0)
MCHC: 32.1 g/dL (ref 30.0–36.0)
MCV: 74.3 fL — ABNORMAL LOW (ref 80.0–100.0)
Platelets: 322 10*3/uL (ref 150–400)
RBC: 5.33 MIL/uL (ref 4.22–5.81)
RDW: 15.1 % (ref 11.5–15.5)
WBC: 6 10*3/uL (ref 4.0–10.5)
nRBC: 0 % (ref 0.0–0.2)

## 2018-05-30 LAB — MAGNESIUM: Magnesium: 2 mg/dL (ref 1.7–2.4)

## 2018-05-30 MED ORDER — THIAMINE HCL 100 MG PO TABS: 100.0000 mg | ORAL_TABLET | Freq: Every day | ORAL | 0 refills | Status: DC

## 2018-05-30 MED ORDER — PANTOPRAZOLE SODIUM 40 MG PO TBEC: 40.0000 mg | DELAYED_RELEASE_TABLET | Freq: Every day | ORAL | 0 refills | Status: DC

## 2018-05-30 MED ORDER — GABAPENTIN 300 MG PO CAPS: 300.0000 mg | ORAL_CAPSULE | Freq: Two times a day (BID) | ORAL | 0 refills | Status: DC

## 2018-05-30 MED ORDER — MULTI-VITAMIN/MINERALS PO TABS: 1.0000 | ORAL_TABLET | Freq: Every day | ORAL | 0 refills | Status: DC

## 2018-05-30 MED ORDER — CARVEDILOL 12.5 MG PO TABS: 6.2500 mg | ORAL_TABLET | Freq: Two times a day (BID) | ORAL | 0 refills | Status: DC

## 2018-05-30 NOTE — Discharge Instructions (Signed)
Gastritis, Adult Gastritis is inflammation of the stomach. There are two kinds of gastritis:  Acute gastritis. This kind develops suddenly.  Chronic gastritis. This kind is much more common and lasts for a long time. Gastritis happens when the lining of the stomach becomes weak or gets damaged. Without treatment, gastritis can lead to stomach bleeding and ulcers. What are the causes? This condition may be caused by:  An infection.  Drinking too much alcohol.  Certain medicines. These include steroids, antibiotics, and some over-the-counter medicines, such as aspirin or ibuprofen.  Having too much acid in the stomach.  A disease of the intestines or stomach.  Stress.  An allergic reaction.  Crohn's disease.  Some cancer treatments (radiation). Sometimes the cause of this condition is not known. What are the signs or symptoms? Symptoms of this condition include:  Pain or a burning sensation in the upper abdomen.  Nausea.  Vomiting.  An uncomfortable feeling of fullness after eating.  Weight loss.  Bad breath.  Blood in your vomit or stools. In some cases, there are no symptoms. How is this diagnosed? This condition may be diagnosed with:  Your medical history and a description of your symptoms.  A physical exam.  Tests. These can include: ? Blood tests. ? Stool tests. ? A test in which a thin, flexible instrument with a light and a camera is passed down the esophagus and into the stomach (upper endoscopy). ? A test in which a sample of tissue is taken for testing (biopsy). How is this treated? This condition may be treated with medicines. The medicines that are used vary depending on the cause of the gastritis:  If the condition is caused by a bacterial infection, you may be given antibiotic medicines.  If the condition is caused by too much acid in the stomach, you may be given medicines called H2 blockers, proton pump inhibitors, or antacids. Treatment  may also involve stopping the use of certain medicines, such as aspirin, ibuprofen, or other NSAIDs. Follow these instructions at home: Medicines  Take over-the-counter and prescription medicines only as told by your health care provider.  If you were prescribed an antibiotic medicine, take it as told by your health care provider. Do not stop taking the antibiotic even if you start to feel better. Eating and drinking   Eat small, frequent meals instead of large meals.  Avoid foods and drinks that make your symptoms worse.  Drink enough fluid to keep your urine pale yellow. Alcohol use  Do not drink alcohol if: ? Your health care provider tells you not to drink. ? You are pregnant, may be pregnant, or are planning to become pregnant.  If you drink alcohol: ? Limit your use to:  0-1 drink a day for women.  0-2 drinks a day for men. ? Be aware of how much alcohol is in your drink. In the [place] one drink equals one 12 oz bottle of beer (355 mL), one 5 oz glass of wine (148 mL), or one 1 oz glass of hard liquor (44 mL). General instructions  Talk with your health care provider about ways to manage stress, such as getting regular exercise or practicing deep breathing, meditation, or yoga.  Do not use any products that contain nicotine or tobacco, such as cigarettes and e-cigarettes. If you need help quitting, ask your health care provider.  Keep all follow-up visits as told by your health care provider. This is important. Contact a health care provider if:  Your   symptoms get worse.  Your symptoms return after treatment. Get help right away if:  You vomit blood or material that looks like coffee grounds.  You have black or dark red stools.  You are unable to keep fluids down.  Your abdominal pain gets worse.  You have a fever.  You do not feel better after one week. Summary  Gastritis is inflammation of the lining of the stomach that can occur suddenly (acute) or  develop slowly over time (chronic).  This condition is diagnosed with a medical history, a physical exam, or tests.  This condition may be treated with medicines to treat infection or medicines to reduce the amount of acid in your stomach.  Follow your health care provider's instructions about taking medicines, making changes to your diet, and knowing when to call for help. This information is not intended to replace advice given to you by your health care provider. Make sure you discuss any questions you have with your health care provider. Document Released: 05/03/2001 Document Revised: 09/26/2017 Document Reviewed: 09/26/2017 Elsevier Interactive Patient Education  2019 [employer]  
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
## 2018-07-04 ENCOUNTER — Telehealth: Payer: Self-pay | Admitting: Internal Medicine

## 2018-07-04 DIAGNOSIS — E119 Type 2 diabetes mellitus without complications: Secondary | ICD-10-CM

## 2018-07-04 NOTE — Telephone Encounter (Signed)
New Message   [facility] case manager is calling trying to get the pt a accu check guide retail care kit Glucometer and strips be sent to our pharmacy. States that is the only brand medicaid will cover. Please follow up

## 2018-07-05 NOTE — Telephone Encounter (Signed)
Troy Mclaughlin could you work on this for me please

## 2018-07-06 ENCOUNTER — Other Ambulatory Visit: Payer: Self-pay

## 2018-07-06 DIAGNOSIS — E119 Type 2 diabetes mellitus without complications: Secondary | ICD-10-CM

## 2018-07-06 MED ORDER — ACCU-CHEK FASTCLIX LANCETS MISC: 1 refills | Status: DC

## 2018-07-06 MED ORDER — GLUCOSE BLOOD VI STRP: ORAL_STRIP | 1 refills | Status: DC

## 2018-07-06 MED ORDER — ACCU-CHEK GUIDE W/DEVICE KIT: 1.0000 | PACK | Freq: Every day | 0 refills | Status: DC

## 2018-07-09 ENCOUNTER — Telehealth: Payer: Self-pay | Admitting: Internal Medicine

## 2018-07-09 NOTE — Telephone Encounter (Signed)
1) Medication(s) Requested (by name): Pt would like a 90 day supply of current medication supply until his next appointment  2) Pharmacy of Choice: [email or web address]  3) Special Requests:   Approved medications will be sent to the pharmacy, we will reach out if there is an issue.  Requests made after 3pm may not be addressed until the following business day!  If a patient is unsure of the name of the medication(s) please note and ask patient to call back when they are able to provide all info, do not send to responsible party until all information is available!

## 2018-07-13 ENCOUNTER — Telehealth: Payer: Self-pay | Admitting: Internal Medicine

## 2018-07-13 NOTE — Telephone Encounter (Signed)
Patient wants to talk about his hospital stay.

## 2018-07-13 NOTE — Telephone Encounter (Signed)
Patient called in regards to a work situation which his condition prevents him from performing.  Patient states he went to the "Social Security" and was told he could not work.  Patient wanted to talk to Dr. Wynetta Emery about this.  Patient was wondering if he couldget a letter either allowing him to work or not allowing him to work.  Patient was advised that this encounter would be forwarded to Dr. Wynetta Emery.  Patient was told that depending on what Dr Wynetta Emery advised he would be called and told.

## 2018-07-16 NOTE — Telephone Encounter (Signed)
Patient has an appointment on Monday. 

## 2018-07-16 NOTE — Telephone Encounter (Signed)
Could you schedule pt an appointment  

## 2018-07-23 ENCOUNTER — Ambulatory Visit: Payer: Medicaid Other | Attending: Internal Medicine | Admitting: Internal Medicine

## 2018-07-23 ENCOUNTER — Encounter: Payer: Self-pay | Admitting: Internal Medicine

## 2018-07-23 ENCOUNTER — Other Ambulatory Visit: Payer: Self-pay

## 2018-07-23 VITALS — BP 169/103 | HR 68 | Temp 97.6°F | Resp 16 | Ht 69.0 in | Wt 158.6 lb

## 2018-07-23 DIAGNOSIS — Z7982 Long term (current) use of aspirin: Secondary | ICD-10-CM | POA: Diagnosis not present

## 2018-07-23 DIAGNOSIS — E11649 Type 2 diabetes mellitus with hypoglycemia without coma: Secondary | ICD-10-CM | POA: Insufficient documentation

## 2018-07-23 DIAGNOSIS — H409 Unspecified glaucoma: Secondary | ICD-10-CM | POA: Diagnosis not present

## 2018-07-23 DIAGNOSIS — F1010 Alcohol abuse, uncomplicated: Secondary | ICD-10-CM

## 2018-07-23 DIAGNOSIS — Z8673 Personal history of transient ischemic attack (TIA), and cerebral infarction without residual deficits: Secondary | ICD-10-CM | POA: Diagnosis not present

## 2018-07-23 DIAGNOSIS — E785 Hyperlipidemia, unspecified: Secondary | ICD-10-CM | POA: Insufficient documentation

## 2018-07-23 DIAGNOSIS — Z8249 Family history of ischemic heart disease and other diseases of the circulatory system: Secondary | ICD-10-CM | POA: Diagnosis not present

## 2018-07-23 DIAGNOSIS — I252 Old myocardial infarction: Secondary | ICD-10-CM | POA: Insufficient documentation

## 2018-07-23 DIAGNOSIS — Z888 Allergy status to other drugs, medicaments and biological substances status: Secondary | ICD-10-CM | POA: Insufficient documentation

## 2018-07-23 DIAGNOSIS — M25511 Pain in right shoulder: Secondary | ICD-10-CM | POA: Diagnosis not present

## 2018-07-23 DIAGNOSIS — F17210 Nicotine dependence, cigarettes, uncomplicated: Secondary | ICD-10-CM | POA: Insufficient documentation

## 2018-07-23 DIAGNOSIS — Z79899 Other long term (current) drug therapy: Secondary | ICD-10-CM | POA: Insufficient documentation

## 2018-07-23 DIAGNOSIS — N529 Male erectile dysfunction, unspecified: Secondary | ICD-10-CM | POA: Diagnosis not present

## 2018-07-23 DIAGNOSIS — Z7984 Long term (current) use of oral hypoglycemic drugs: Secondary | ICD-10-CM | POA: Insufficient documentation

## 2018-07-23 DIAGNOSIS — K219 Gastro-esophageal reflux disease without esophagitis: Secondary | ICD-10-CM | POA: Insufficient documentation

## 2018-07-23 DIAGNOSIS — I2510 Atherosclerotic heart disease of native coronary artery without angina pectoris: Secondary | ICD-10-CM | POA: Diagnosis present

## 2018-07-23 DIAGNOSIS — F17200 Nicotine dependence, unspecified, uncomplicated: Secondary | ICD-10-CM

## 2018-07-23 DIAGNOSIS — I25118 Atherosclerotic heart disease of native coronary artery with other forms of angina pectoris: Secondary | ICD-10-CM

## 2018-07-23 DIAGNOSIS — I10 Essential (primary) hypertension: Secondary | ICD-10-CM | POA: Insufficient documentation

## 2018-07-23 LAB — GLUCOSE, POCT (MANUAL RESULT ENTRY)
POC Glucose: 75 mg/dl (ref 70–99)
POC Glucose: 71 mg/dl (ref 70–99)
POC Glucose: 79 mg/dl (ref 70–99)

## 2018-07-23 MED ORDER — NITROGLYCERIN 0.4 MG SL SUBL: 0.4000 mg | SUBLINGUAL_TABLET | SUBLINGUAL | 1 refills | Status: DC | PRN

## 2018-07-23 MED ORDER — NICOTINE 14 MG/24HR TD PT24: 14.0000 mg | MEDICATED_PATCH | Freq: Every day | TRANSDERMAL | 1 refills | Status: DC

## 2018-07-23 MED ORDER — AMLODIPINE BESYLATE 10 MG PO TABS: 10.0000 mg | ORAL_TABLET | Freq: Every day | ORAL | 6 refills | Status: DC

## 2018-07-23 MED ORDER — CARVEDILOL 12.5 MG PO TABS: 6.2500 mg | ORAL_TABLET | Freq: Two times a day (BID) | ORAL | 6 refills | Status: DC

## 2018-07-23 MED ORDER — INSTA-GLUCOSE 77.4 % PO GEL: 1.0000 | Freq: Once | ORAL | Status: DC

## 2018-07-23 NOTE — Patient Instructions (Addendum)
I have referred you to cardiology for further evaluation of your chest pains.  I have sent a prescription to your pharmacy for the nicotine patches.  You should wear them for the full 24 hours and change them every morning.  Try to set a quit date.  Please make sure that you are taking your heart on blood pressure medications including aspirin, atorvastatin for cholesterol, carvedilol for heart and blood pressure and amlodipine for blood pressure.  As discussed, I would avoid binge drinking of alcohol.  You can use regular Tylenol as needed for the pain in the right shoulder.  You can also continue to use the BenGay rub.
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
## 2018-08-20 ENCOUNTER — Ambulatory Visit: Payer: Self-pay | Admitting: Internal Medicine

## 2018-09-01 NOTE — Telephone Encounter (Signed)
done

## 2018-11-30 ENCOUNTER — Encounter (HOSPITAL_COMMUNITY): Payer: Self-pay | Admitting: Emergency Medicine

## 2018-11-30 ENCOUNTER — Other Ambulatory Visit: Payer: Self-pay

## 2018-11-30 ENCOUNTER — Emergency Department (HOSPITAL_COMMUNITY)
Admission: EM | Admit: 2018-11-30 | Discharge: 2018-11-30 | Disposition: A | Discharge status: Home / Self Care | Payer: Medicaid Other | Attending: Emergency Medicine | Admitting: Emergency Medicine

## 2018-11-30 DIAGNOSIS — Z79899 Other long term (current) drug therapy: Secondary | ICD-10-CM | POA: Diagnosis not present

## 2018-11-30 DIAGNOSIS — R0789 Other chest pain: Secondary | ICD-10-CM | POA: Diagnosis not present

## 2018-11-30 DIAGNOSIS — Z7982 Long term (current) use of aspirin: Secondary | ICD-10-CM | POA: Insufficient documentation

## 2018-11-30 DIAGNOSIS — R079 Chest pain, unspecified: Secondary | ICD-10-CM | POA: Diagnosis not present

## 2018-11-30 DIAGNOSIS — E119 Type 2 diabetes mellitus without complications: Secondary | ICD-10-CM | POA: Insufficient documentation

## 2018-11-30 DIAGNOSIS — I10 Essential (primary) hypertension: Secondary | ICD-10-CM | POA: Diagnosis not present

## 2018-11-30 DIAGNOSIS — R1111 Vomiting without nausea: Secondary | ICD-10-CM | POA: Diagnosis not present

## 2018-11-30 DIAGNOSIS — I2510 Atherosclerotic heart disease of native coronary artery without angina pectoris: Secondary | ICD-10-CM | POA: Insufficient documentation

## 2018-11-30 DIAGNOSIS — I252 Old myocardial infarction: Secondary | ICD-10-CM | POA: Insufficient documentation

## 2018-11-30 DIAGNOSIS — Z8673 Personal history of transient ischemic attack (TIA), and cerebral infarction without residual deficits: Secondary | ICD-10-CM | POA: Diagnosis not present

## 2018-11-30 DIAGNOSIS — R000 Tachycardia, unspecified: Secondary | ICD-10-CM | POA: Diagnosis not present

## 2018-11-30 DIAGNOSIS — R0902 Hypoxemia: Secondary | ICD-10-CM | POA: Diagnosis not present

## 2018-11-30 DIAGNOSIS — Z7984 Long term (current) use of oral hypoglycemic drugs: Secondary | ICD-10-CM | POA: Insufficient documentation

## 2018-11-30 DIAGNOSIS — F17210 Nicotine dependence, cigarettes, uncomplicated: Secondary | ICD-10-CM | POA: Diagnosis not present

## 2018-11-30 MED ORDER — ONDANSETRON 4 MG PO TBDP
4.0000 mg | ORAL_TABLET | Freq: Once | ORAL | Status: AC
  Administered 2018-11-30: 4 mg via ORAL
  Filled 2018-11-30: qty 1

## 2018-11-30 MED ORDER — ONDANSETRON 4 MG PO TBDP: 4.0000 mg | ORAL_TABLET | Freq: Three times a day (TID) | ORAL | 0 refills | Status: DC | PRN

## 2018-11-30 MED ORDER — LIDOCAINE VISCOUS HCL 2 % MT SOLN
15.0000 mL | Freq: Once | OROMUCOSAL | Status: AC
  Administered 2018-11-30: 22:00:00 15 mL via ORAL
  Filled 2018-11-30: qty 15

## 2018-11-30 MED ORDER — ALUM & MAG HYDROXIDE-SIMETH 200-200-20 MG/5ML PO SUSP
30.0000 mL | Freq: Once | ORAL | Status: AC
  Administered 2018-11-30: 30 mL via ORAL
  Filled 2018-11-30: qty 30
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
## 2018-11-30 NOTE — Discharge Instructions (Signed)
Follow up with your family doc in the office.  

## 2018-11-30 NOTE — ED Triage Notes (Signed)
Pt to ED via [facility] with c/o mid chest pain radiating into right arm onset 3 days ago.  Pt also c/o black stools x's 3 days.  Pt admits to 2 qts of beer today.  EMS gave pt NTG x's 1 without relief of pain, Zofran 4mg  IV and ASA 324mg .

## 2018-12-24 ENCOUNTER — Emergency Department (HOSPITAL_COMMUNITY): Payer: Medicaid Other

## 2018-12-24 ENCOUNTER — Emergency Department (HOSPITAL_COMMUNITY)
Admission: EM | Admit: 2018-12-24 | Discharge: 2018-12-24 | Disposition: A | Discharge status: Home / Self Care | Payer: Medicaid Other | Attending: Emergency Medicine | Admitting: Emergency Medicine

## 2018-12-24 ENCOUNTER — Encounter (HOSPITAL_COMMUNITY): Payer: Self-pay | Admitting: Emergency Medicine

## 2018-12-24 DIAGNOSIS — Z7982 Long term (current) use of aspirin: Secondary | ICD-10-CM | POA: Diagnosis not present

## 2018-12-24 DIAGNOSIS — R112 Nausea with vomiting, unspecified: Secondary | ICD-10-CM | POA: Insufficient documentation

## 2018-12-24 DIAGNOSIS — I259 Chronic ischemic heart disease, unspecified: Secondary | ICD-10-CM | POA: Diagnosis not present

## 2018-12-24 DIAGNOSIS — K5900 Constipation, unspecified: Secondary | ICD-10-CM | POA: Diagnosis not present

## 2018-12-24 DIAGNOSIS — R1110 Vomiting, unspecified: Secondary | ICD-10-CM | POA: Diagnosis not present

## 2018-12-24 DIAGNOSIS — I10 Essential (primary) hypertension: Secondary | ICD-10-CM | POA: Insufficient documentation

## 2018-12-24 DIAGNOSIS — F17210 Nicotine dependence, cigarettes, uncomplicated: Secondary | ICD-10-CM | POA: Insufficient documentation

## 2018-12-24 DIAGNOSIS — Z7984 Long term (current) use of oral hypoglycemic drugs: Secondary | ICD-10-CM | POA: Diagnosis not present

## 2018-12-24 DIAGNOSIS — E119 Type 2 diabetes mellitus without complications: Secondary | ICD-10-CM | POA: Insufficient documentation

## 2018-12-24 DIAGNOSIS — Z79899 Other long term (current) drug therapy: Secondary | ICD-10-CM | POA: Diagnosis not present

## 2018-12-24 DIAGNOSIS — R079 Chest pain, unspecified: Secondary | ICD-10-CM | POA: Diagnosis not present

## 2018-12-24 DIAGNOSIS — R1084 Generalized abdominal pain: Secondary | ICD-10-CM | POA: Diagnosis not present

## 2018-12-24 LAB — URINALYSIS, ROUTINE W REFLEX MICROSCOPIC
Glucose, UA: NEGATIVE mg/dL
Hgb urine dipstick: NEGATIVE
Ketones, ur: 20 mg/dL — AB
Leukocytes,Ua: NEGATIVE
Nitrite: NEGATIVE
Protein, ur: 30 mg/dL — AB
Specific Gravity, Urine: 1.028 (ref 1.005–1.030)
pH: 6 (ref 5.0–8.0)

## 2018-12-24 LAB — CBC
HCT: 45.1 % (ref 39.0–52.0)
Hemoglobin: 15.4 g/dL (ref 13.0–17.0)
MCH: 24.9 pg — ABNORMAL LOW (ref 26.0–34.0)
MCHC: 34.1 g/dL (ref 30.0–36.0)
MCV: 73 fL — ABNORMAL LOW (ref 80.0–100.0)
Platelets: 255 10*3/uL (ref 150–400)
RBC: 6.18 MIL/uL — ABNORMAL HIGH (ref 4.22–5.81)
RDW: 15 % (ref 11.5–15.5)
WBC: 7.2 10*3/uL (ref 4.0–10.5)
nRBC: 0 % (ref 0.0–0.2)

## 2018-12-24 LAB — COMPREHENSIVE METABOLIC PANEL
ALT: 653 U/L — ABNORMAL HIGH (ref 0–44)
AST: 996 U/L — ABNORMAL HIGH (ref 15–41)
Albumin: 3.9 g/dL (ref 3.5–5.0)
Alkaline Phosphatase: 102 U/L (ref 38–126)
Anion gap: 14 (ref 5–15)
BUN: 22 mg/dL (ref 8–23)
CO2: 26 mmol/L (ref 22–32)
Calcium: 9.5 mg/dL (ref 8.9–10.3)
Chloride: 93 mmol/L — ABNORMAL LOW (ref 98–111)
Creatinine, Ser: 1.28 mg/dL — ABNORMAL HIGH (ref 0.61–1.24)
GFR calc Af Amer: >60 mL/min (ref >60)
GFR calc non Af Amer: >60 mL/min (ref >60)
Glucose, Bld: 119 mg/dL — ABNORMAL HIGH (ref 70–99)
Potassium: 3.5 mmol/L (ref 3.5–5.1)
Sodium: 133 mmol/L — ABNORMAL LOW (ref 135–145)
Total Bilirubin: 2 mg/dL — ABNORMAL HIGH (ref 0.3–1.2)
Total Protein: 7.4 g/dL (ref 6.5–8.1)

## 2018-12-24 LAB — LIPASE, BLOOD: Lipase: 24 U/L (ref 11–51)

## 2018-12-24 LAB — TROPONIN I (HIGH SENSITIVITY)
Troponin I (High Sensitivity): 7 ng/L (ref <18)
Troponin I (High Sensitivity): 7 ng/L (ref <18)

## 2018-12-24 MED ORDER — IOHEXOL 300 MG/ML  SOLN
100.0000 mL | Freq: Once | INTRAMUSCULAR | Status: AC | PRN
  Administered 2018-12-24: 100 mL via INTRAVENOUS

## 2018-12-24 MED ORDER — ONDANSETRON HCL 4 MG/2ML IJ SOLN
4.0000 mg | Freq: Once | INTRAMUSCULAR | Status: AC
  Administered 2018-12-24: 4 mg via INTRAVENOUS
  Filled 2018-12-24: qty 2

## 2018-12-24 MED ORDER — LACTATED RINGERS IV BOLUS
1000.0000 mL | Freq: Once | INTRAVENOUS | Status: AC
  Administered 2018-12-24: 1000 mL via INTRAVENOUS

## 2018-12-24 MED ORDER — MORPHINE SULFATE (PF) 4 MG/ML IV SOLN
4.0000 mg | Freq: Once | INTRAVENOUS | Status: AC
  Administered 2018-12-24: 4 mg via INTRAVENOUS
  Filled 2018-12-24: qty 1

## 2018-12-24 MED ORDER — ONDANSETRON 4 MG PO TBDP: 4.0000 mg | ORAL_TABLET | Freq: Three times a day (TID) | ORAL | 0 refills | Status: DC | PRN

## 2018-12-24 MED ORDER — SODIUM CHLORIDE 0.9% FLUSH: 3.0000 mL | Freq: Once | INTRAVENOUS | Status: DC

## 2018-12-24 NOTE — ED Notes (Signed)
Patient transported to CT 
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
## 2018-12-24 NOTE — ED Provider Notes (Signed)
I saw and evaluated the patient, reviewed the resident's note and I agree with the findings and plan.  Pertinent History: The patient is a well-appearing 63 year old male, prior abdominal surgery complaining of abdominal pain.  Pertinent Exam findings: On exam the patient has a soft abdomen, he is thin, no peritoneal signs, no focal tenderness, no guarding or peritoneal signs  I was personally present and directly supervised the following procedures:  Medical evaluation  CT scan shows no signs of obstruction, the patient has a nonsurgical abdomen, likely just constipation, advice given.    Final diagnoses:  Constipation, unspecified constipation type  Non-intractable vomiting with nausea, unspecified vomiting type      Noemi Chapel, MD 12/25/18 2036

## 2018-12-24 NOTE — ED Notes (Signed)
ED Provider at bedside. 
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
## 2018-12-24 NOTE — Discharge Instructions (Addendum)
Your CT scan showed no evidence of bowel obstruction.  You have a significant amount of stool in your colon.  You can try taking over-the-counter laxatives but you will likely need to try either a suppository or enema at home for your constipation.  MiraLAX, 1 scoop by mouth twice daily, mix this in a drink of your choice  Magnesium citrate, drink 1 bottle immediately when you get home  Dulcolax twice a day  This may eventually cause diarrhea, this is okay, please let all of the stool come out, drink plenty of fluids to keep up with it.

## 2019-01-09 NOTE — Progress Notes (Signed)
This encounter was created in error - please disregard.

## 2019-01-10 ENCOUNTER — Ambulatory Visit: Payer: Medicaid Other | Admitting: Physician Assistant

## 2019-01-10 ENCOUNTER — Other Ambulatory Visit: Payer: Self-pay

## 2019-01-16 NOTE — Progress Notes (Signed)
Patient ID: Troy Mclaughlin, male   DOB: December 03, 1955, 63 y.o.   MRN: [id number]   Virtual Visit via Telephone Note  I connected with Troy Mclaughlin on 01/17/19 at 11:10 AM EDT by telephone and verified that I am speaking with the correct person using two identifiers.   I discussed the limitations, risks, security and privacy concerns of performing an evaluation and management service by telephone and the availability of in person appointments. I also discussed with the patient that there may be a patient responsible charge related to this service. The patient expressed understanding and agreed to proceed.  Patient location:  home My Location:  [place] office Persons on the call:  Me and the patient  History of Present Illness:   Presents for hospital f/up- After ED visit 12/24/2018 for constipation.  No further constipation.  No further abdominal pain.  Not checking blood sugars or blood pressures.  Denies H/O IVDU.  Admits to drinking alcohol but says it is not problematic.  Not always compliant with meds due to lack of ability to pay for them.  Appetite is ok.  Denies melena/hematochezia.    (March to July 2020 18 pounds weight loss noted in Epic.)  Appetite not great lately.    From ED note: Pertinent History: The patient is a well-appearing 63 year old male, prior abdominal surgery complaining of abdominal pain.  Pertinent Exam findings: On exam the patient has a soft abdomen, he is thin, no peritoneal signs, no focal tenderness, no guarding or peritoneal signs.    Observations/Objective:  A&Ox3   Assessment and Plan: 1. Constipation, unspecified constipation type resolved - Comprehensive metabolic panel; Future - CBC with Differential/Platelet; Future  2. Hyponatremia Check CMP  3. Alcohol consumption binge drinking Do not drink alcohol. I have counseled the patient at length about substance abuse and addiction.  12 step meetings/recovery recommended.  Local 12 step meeting  lists were given and attendance was encouraged.  Patient expresses understanding.   4. Essential hypertension - amLODipine (NORVASC) 10 MG tablet; Take 1 tablet (10 mg total) by mouth daily.  Dispense: 30 tablet; Refill: 6 - carvedilol (COREG) 12.5 MG tablet; Take 0.5 tablets (6.25 mg total) by mouth 2 (two) times daily with a meal.  Dispense: 30 tablet; Refill: 6  5. Coronary artery disease involving native coronary artery of native heart with other form of angina pectoris (HCC) - atorvastatin (LIPITOR) 40 MG tablet; Take 1 tablet (40 mg total) by mouth daily.  Dispense: 90 tablet; Refill: 2 - carvedilol (COREG) 12.5 MG tablet; Take 0.5 tablets (6.25 mg total) by mouth 2 (two) times daily with a meal.  Dispense: 30 tablet; Refill: 6  6. Type 2 diabetes mellitus without complication, without long-term current use of insulin (HCC) - metFORMIN (GLUCOPHAGE-XR) 500 MG 24 hr tablet; Take 1 tablet (500 mg total) by mouth daily with breakfast.  Dispense: 30 tablet; Refill: 11 Start checking blood sugars and bring to next visit.    7. Abdominal wall pain improved - gabapentin (NEURONTIN) 300 MG capsule; Take 1 capsule (300 mg total) by mouth 2 (two) times daily.  Dispense: 60 capsule; Refill: 1  9. Elevated LFTs with weight loss Avoid alcohol and products with alcohol.  LFT 1 year ago were normal.   - Comprehensive metabolic panel; Future - Ambulatory referral to Gastroenterology - [facility]; Future - Hepatitis panel, acute; Future   Follow Up Instructions: With PCP in 1 month   I discussed the assessment and treatment plan with the  patient. The patient was provided an opportunity to ask questions and all were answered. The patient agreed with the plan and demonstrated an understanding of the instructions.   The patient was advised to call back or seek an in-person evaluation if the symptoms worsen or if the condition fails to improve as anticipated.  I provided 15 minutes of non-face-to-face  time during this encounter.   Freeman Caldron, PA-C

## 2019-01-17 ENCOUNTER — Ambulatory Visit: Payer: Medicaid Other | Attending: Internal Medicine | Admitting: Physician Assistant

## 2019-01-17 DIAGNOSIS — E119 Type 2 diabetes mellitus without complications: Secondary | ICD-10-CM

## 2019-01-17 DIAGNOSIS — R109 Unspecified abdominal pain: Secondary | ICD-10-CM

## 2019-01-17 DIAGNOSIS — E871 Hypo-osmolality and hyponatremia: Secondary | ICD-10-CM | POA: Diagnosis not present

## 2019-01-17 DIAGNOSIS — I10 Essential (primary) hypertension: Secondary | ICD-10-CM | POA: Diagnosis not present

## 2019-01-17 DIAGNOSIS — R7989 Other specified abnormal findings of blood chemistry: Secondary | ICD-10-CM

## 2019-01-17 DIAGNOSIS — F1010 Alcohol abuse, uncomplicated: Secondary | ICD-10-CM

## 2019-01-17 DIAGNOSIS — R945 Abnormal results of liver function studies: Secondary | ICD-10-CM

## 2019-01-17 DIAGNOSIS — K5900 Constipation, unspecified: Secondary | ICD-10-CM | POA: Diagnosis not present

## 2019-01-17 DIAGNOSIS — I25118 Atherosclerotic heart disease of native coronary artery with other forms of angina pectoris: Secondary | ICD-10-CM

## 2019-01-17 MED ORDER — GABAPENTIN 300 MG PO CAPS: 300.0000 mg | ORAL_CAPSULE | Freq: Two times a day (BID) | ORAL | 0 refills | Status: DC

## 2019-01-17 MED ORDER — METFORMIN HCL ER 500 MG PO TB24: 500.0000 mg | ORAL_TABLET | Freq: Every day | ORAL | 11 refills | Status: DC

## 2019-01-17 MED ORDER — ATORVASTATIN CALCIUM 40 MG PO TABS: 40.0000 mg | ORAL_TABLET | Freq: Every day | ORAL | 2 refills | Status: DC

## 2019-01-17 MED ORDER — AMLODIPINE BESYLATE 10 MG PO TABS: 10.0000 mg | ORAL_TABLET | Freq: Every day | ORAL | 6 refills | Status: DC

## 2019-01-17 MED ORDER — CARVEDILOL 12.5 MG PO TABS: 6.2500 mg | ORAL_TABLET | Freq: Two times a day (BID) | ORAL | 6 refills | Status: DC

## 2019-01-17 MED ORDER — GABAPENTIN 300 MG PO CAPS: 300.0000 mg | ORAL_CAPSULE | Freq: Two times a day (BID) | ORAL | 1 refills | Status: DC

## 2019-01-17 MED FILL — GABAPENTIN 300 MG CAPSULE: 300 | 30 days supply | Qty: 60 | Fill #0

## 2019-01-17 MED FILL — AMLODIPINE BESYLATE 10 MG T: 10 | 30 days supply | Qty: 30 | Fill #0

## 2019-01-17 MED FILL — METFORMIN HCL ER 500 MG TB2: 500 | 30 days supply | Qty: 30 | Fill #0

## 2019-01-25 ENCOUNTER — Other Ambulatory Visit: Payer: Medicaid Other

## 2019-02-22 DIAGNOSIS — Z23 Encounter for immunization: Secondary | ICD-10-CM | POA: Diagnosis not present

## 2019-03-28 ENCOUNTER — Telehealth: Payer: Self-pay | Admitting: Internal Medicine

## 2019-03-28 NOTE — Telephone Encounter (Signed)
Hanna with community care called to advise that patient is not able to afford medications, glucometer, or bp cuff. Please follow up.

## 2019-03-29 NOTE — Telephone Encounter (Signed)
Will forward to pcp

## 2019-03-30 ENCOUNTER — Emergency Department (HOSPITAL_COMMUNITY): Payer: Medicaid Other

## 2019-03-30 ENCOUNTER — Encounter (HOSPITAL_COMMUNITY): Payer: Self-pay

## 2019-03-30 ENCOUNTER — Other Ambulatory Visit: Payer: Self-pay

## 2019-03-30 ENCOUNTER — Inpatient Hospital Stay (HOSPITAL_COMMUNITY)
Admission: EM | Admit: 2019-03-30 | Discharge: 2019-04-03 | DRG: 393 | Disposition: A | Discharge status: Home / Self Care | Payer: Medicaid Other | Attending: Infectious Disease | Admitting: Infectious Disease

## 2019-03-30 DIAGNOSIS — K9589 Other complications of other bariatric procedure: Secondary | ICD-10-CM | POA: Diagnosis not present

## 2019-03-30 DIAGNOSIS — K250 Acute gastric ulcer with hemorrhage: Secondary | ICD-10-CM | POA: Diagnosis not present

## 2019-03-30 DIAGNOSIS — Y832 Surgical operation with anastomosis, bypass or graft as the cause of abnormal reaction of the patient, or of later complication, without mention of misadventure at the time of the procedure: Secondary | ICD-10-CM | POA: Diagnosis present

## 2019-03-30 DIAGNOSIS — R1084 Generalized abdominal pain: Secondary | ICD-10-CM | POA: Diagnosis not present

## 2019-03-30 DIAGNOSIS — R0902 Hypoxemia: Secondary | ICD-10-CM | POA: Diagnosis not present

## 2019-03-30 DIAGNOSIS — K2920 Alcoholic gastritis without bleeding: Secondary | ICD-10-CM | POA: Diagnosis present

## 2019-03-30 DIAGNOSIS — E119 Type 2 diabetes mellitus without complications: Secondary | ICD-10-CM

## 2019-03-30 DIAGNOSIS — Z20828 Contact with and (suspected) exposure to other viral communicable diseases: Secondary | ICD-10-CM | POA: Diagnosis present

## 2019-03-30 DIAGNOSIS — Z800 Family history of malignant neoplasm of digestive organs: Secondary | ICD-10-CM

## 2019-03-30 DIAGNOSIS — I2510 Atherosclerotic heart disease of native coronary artery without angina pectoris: Secondary | ICD-10-CM | POA: Diagnosis present

## 2019-03-30 DIAGNOSIS — Z888 Allergy status to other drugs, medicaments and biological substances status: Secondary | ICD-10-CM

## 2019-03-30 DIAGNOSIS — F1010 Alcohol abuse, uncomplicated: Secondary | ICD-10-CM | POA: Diagnosis present

## 2019-03-30 DIAGNOSIS — R112 Nausea with vomiting, unspecified: Secondary | ICD-10-CM

## 2019-03-30 DIAGNOSIS — K5730 Diverticulosis of large intestine without perforation or abscess without bleeding: Secondary | ICD-10-CM | POA: Diagnosis present

## 2019-03-30 DIAGNOSIS — K9189 Other postprocedural complications and disorders of digestive system: Principal | ICD-10-CM | POA: Diagnosis present

## 2019-03-30 DIAGNOSIS — K922 Gastrointestinal hemorrhage, unspecified: Secondary | ICD-10-CM | POA: Diagnosis not present

## 2019-03-30 DIAGNOSIS — Z8619 Personal history of other infectious and parasitic diseases: Secondary | ICD-10-CM

## 2019-03-30 DIAGNOSIS — E43 Unspecified severe protein-calorie malnutrition: Secondary | ICD-10-CM | POA: Diagnosis not present

## 2019-03-30 DIAGNOSIS — F17210 Nicotine dependence, cigarettes, uncomplicated: Secondary | ICD-10-CM | POA: Diagnosis present

## 2019-03-30 DIAGNOSIS — K921 Melena: Secondary | ICD-10-CM | POA: Diagnosis not present

## 2019-03-30 DIAGNOSIS — K2100 Gastro-esophageal reflux disease with esophagitis, without bleeding: Secondary | ICD-10-CM | POA: Diagnosis present

## 2019-03-30 DIAGNOSIS — E871 Hypo-osmolality and hyponatremia: Secondary | ICD-10-CM | POA: Diagnosis present

## 2019-03-30 DIAGNOSIS — Z903 Acquired absence of stomach [part of]: Secondary | ICD-10-CM

## 2019-03-30 DIAGNOSIS — B9681 Helicobacter pylori [H. pylori] as the cause of diseases classified elsewhere: Secondary | ICD-10-CM | POA: Diagnosis present

## 2019-03-30 DIAGNOSIS — Z79899 Other long term (current) drug therapy: Secondary | ICD-10-CM

## 2019-03-30 DIAGNOSIS — F1020 Alcohol dependence, uncomplicated: Secondary | ICD-10-CM | POA: Diagnosis present

## 2019-03-30 DIAGNOSIS — H409 Unspecified glaucoma: Secondary | ICD-10-CM | POA: Diagnosis present

## 2019-03-30 DIAGNOSIS — Z7982 Long term (current) use of aspirin: Secondary | ICD-10-CM

## 2019-03-30 DIAGNOSIS — R1032 Left lower quadrant pain: Secondary | ICD-10-CM

## 2019-03-30 DIAGNOSIS — Z9049 Acquired absence of other specified parts of digestive tract: Secondary | ICD-10-CM

## 2019-03-30 DIAGNOSIS — Z8719 Personal history of other diseases of the digestive system: Secondary | ICD-10-CM

## 2019-03-30 DIAGNOSIS — Z9884 Bariatric surgery status: Secondary | ICD-10-CM

## 2019-03-30 DIAGNOSIS — Z87828 Personal history of other (healed) physical injury and trauma: Secondary | ICD-10-CM

## 2019-03-30 DIAGNOSIS — I160 Hypertensive urgency: Secondary | ICD-10-CM | POA: Diagnosis present

## 2019-03-30 DIAGNOSIS — K259 Gastric ulcer, unspecified as acute or chronic, without hemorrhage or perforation: Secondary | ICD-10-CM | POA: Diagnosis present

## 2019-03-30 DIAGNOSIS — Z8673 Personal history of transient ischemic attack (TIA), and cerebral infarction without residual deficits: Secondary | ICD-10-CM

## 2019-03-30 DIAGNOSIS — Z7951 Long term (current) use of inhaled steroids: Secondary | ICD-10-CM

## 2019-03-30 DIAGNOSIS — R1012 Left upper quadrant pain: Secondary | ICD-10-CM | POA: Diagnosis not present

## 2019-03-30 DIAGNOSIS — Z823 Family history of stroke: Secondary | ICD-10-CM

## 2019-03-30 DIAGNOSIS — K2950 Unspecified chronic gastritis without bleeding: Secondary | ICD-10-CM | POA: Diagnosis present

## 2019-03-30 DIAGNOSIS — E876 Hypokalemia: Secondary | ICD-10-CM | POA: Diagnosis present

## 2019-03-30 DIAGNOSIS — Z681 Body mass index (BMI) 19 or less, adult: Secondary | ICD-10-CM | POA: Diagnosis not present

## 2019-03-30 DIAGNOSIS — Z8249 Family history of ischemic heart disease and other diseases of the circulatory system: Secondary | ICD-10-CM

## 2019-03-30 DIAGNOSIS — I10 Essential (primary) hypertension: Secondary | ICD-10-CM | POA: Diagnosis present

## 2019-03-30 DIAGNOSIS — R1110 Vomiting, unspecified: Secondary | ICD-10-CM

## 2019-03-30 DIAGNOSIS — E1136 Type 2 diabetes mellitus with diabetic cataract: Secondary | ICD-10-CM | POA: Diagnosis not present

## 2019-03-30 DIAGNOSIS — E785 Hyperlipidemia, unspecified: Secondary | ICD-10-CM | POA: Diagnosis present

## 2019-03-30 DIAGNOSIS — Z933 Colostomy status: Secondary | ICD-10-CM

## 2019-03-30 DIAGNOSIS — Z8371 Family history of colonic polyps: Secondary | ICD-10-CM

## 2019-03-30 DIAGNOSIS — F1021 Alcohol dependence, in remission: Secondary | ICD-10-CM

## 2019-03-30 DIAGNOSIS — R000 Tachycardia, unspecified: Secondary | ICD-10-CM | POA: Diagnosis not present

## 2019-03-30 DIAGNOSIS — I252 Old myocardial infarction: Secondary | ICD-10-CM

## 2019-03-30 DIAGNOSIS — K317 Polyp of stomach and duodenum: Secondary | ICD-10-CM | POA: Diagnosis present

## 2019-03-30 DIAGNOSIS — Z7984 Long term (current) use of oral hypoglycemic drugs: Secondary | ICD-10-CM

## 2019-03-30 DIAGNOSIS — Z8711 Personal history of peptic ulcer disease: Secondary | ICD-10-CM

## 2019-03-30 HISTORY — DX: Gastric ulcer, unspecified as acute or chronic, without hemorrhage or perforation: K25.9

## 2019-03-30 LAB — CBC WITH DIFFERENTIAL/PLATELET
Abs Immature Granulocytes: 0.01 10*3/uL (ref 0.00–0.07)
Basophils Absolute: 0 10*3/uL (ref 0.0–0.1)
Basophils Relative: 0 %
Eosinophils Absolute: 0 10*3/uL (ref 0.0–0.5)
Eosinophils Relative: 0 %
HCT: 39 % (ref 39.0–52.0)
Hemoglobin: 13.2 g/dL (ref 13.0–17.0)
Immature Granulocytes: 0 %
Lymphocytes Relative: 33 %
Lymphs Abs: 1.8 10*3/uL (ref 0.7–4.0)
MCH: 25.4 pg — ABNORMAL LOW (ref 26.0–34.0)
MCHC: 33.8 g/dL (ref 30.0–36.0)
MCV: 75 fL — ABNORMAL LOW (ref 80.0–100.0)
Monocytes Absolute: 0.6 10*3/uL (ref 0.1–1.0)
Monocytes Relative: 11 %
Neutro Abs: 3.1 10*3/uL (ref 1.7–7.7)
Neutrophils Relative %: 56 %
Platelets: 294 10*3/uL (ref 150–400)
RBC: 5.2 MIL/uL (ref 4.22–5.81)
RDW: 14.7 % (ref 11.5–15.5)
WBC: 5.5 10*3/uL (ref 4.0–10.5)
nRBC: 0 % (ref 0.0–0.2)

## 2019-03-30 LAB — COMPREHENSIVE METABOLIC PANEL
ALT: 18 U/L (ref 0–44)
AST: 23 U/L (ref 15–41)
Albumin: 3.8 g/dL (ref 3.5–5.0)
Alkaline Phosphatase: 78 U/L (ref 38–126)
Anion gap: 11 (ref 5–15)
BUN: 14 mg/dL (ref 8–23)
CO2: 26 mmol/L (ref 22–32)
Calcium: 8.7 mg/dL — ABNORMAL LOW (ref 8.9–10.3)
Chloride: 97 mmol/L — ABNORMAL LOW (ref 98–111)
Creatinine, Ser: 0.9 mg/dL (ref 0.61–1.24)
GFR calc Af Amer: >60 mL/min (ref >60)
GFR calc non Af Amer: >60 mL/min (ref >60)
Glucose, Bld: 82 mg/dL (ref 70–99)
Potassium: 3.3 mmol/L — ABNORMAL LOW (ref 3.5–5.1)
Sodium: 134 mmol/L — ABNORMAL LOW (ref 135–145)
Total Bilirubin: 0.8 mg/dL (ref 0.3–1.2)
Total Protein: 7.1 g/dL (ref 6.5–8.1)

## 2019-03-30 LAB — URINALYSIS, ROUTINE W REFLEX MICROSCOPIC
Bilirubin Urine: NEGATIVE
Glucose, UA: NEGATIVE mg/dL
Hgb urine dipstick: NEGATIVE
Ketones, ur: 20 mg/dL — AB
Leukocytes,Ua: NEGATIVE
Nitrite: NEGATIVE
Protein, ur: NEGATIVE mg/dL
Specific Gravity, Urine: 1.016 (ref 1.005–1.030)
pH: 8 (ref 5.0–8.0)

## 2019-03-30 LAB — GLUCOSE, CAPILLARY
Glucose-Capillary: 89 mg/dL (ref 70–99)
Glucose-Capillary: 81 mg/dL (ref 70–99)

## 2019-03-30 LAB — PROTIME-INR
INR: 0.9 (ref 0.8–1.2)
Prothrombin Time: 12.4 seconds (ref 11.4–15.2)

## 2019-03-30 LAB — HEMOGLOBIN AND HEMATOCRIT, BLOOD
HCT: 38.4 % — ABNORMAL LOW (ref 39.0–52.0)
Hemoglobin: 13.1 g/dL (ref 13.0–17.0)

## 2019-03-30 LAB — LIPASE, BLOOD: Lipase: 18 U/L (ref 11–51)

## 2019-03-30 LAB — TROPONIN I (HIGH SENSITIVITY)
Troponin I (High Sensitivity): 10 ng/L (ref <18)
Troponin I (High Sensitivity): 11 ng/L (ref <18)

## 2019-03-30 LAB — HEMOGLOBIN A1C
Hgb A1c MFr Bld: 5.6 % (ref 4.8–5.6)
Mean Plasma Glucose: 114.02 mg/dL

## 2019-03-30 LAB — POC OCCULT BLOOD, ED: Fecal Occult Bld: POSITIVE — AB

## 2019-03-30 LAB — PHOSPHORUS: Phosphorus: 2.4 mg/dL — ABNORMAL LOW (ref 2.5–4.6)

## 2019-03-30 LAB — HIV ANTIBODY (ROUTINE TESTING W REFLEX): HIV Screen 4th Generation wRfx: NONREACTIVE

## 2019-03-30 LAB — MAGNESIUM: Magnesium: 1.9 mg/dL (ref 1.7–2.4)

## 2019-03-30 MED ORDER — ACETAMINOPHEN 650 MG RE SUPP: 650.0000 mg | Freq: Four times a day (QID) | RECTAL | Status: DC | PRN

## 2019-03-30 MED ORDER — ACETAMINOPHEN 325 MG PO TABS
650.0000 mg | ORAL_TABLET | Freq: Four times a day (QID) | ORAL | Status: DC | PRN
  Administered 2019-04-02 – 2019-04-03 (×3): 650 mg via ORAL
  Filled 2019-03-30 (×3): qty 2

## 2019-03-30 MED ORDER — NICOTINE 14 MG/24HR TD PT24
14.0000 mg | MEDICATED_PATCH | Freq: Every day | TRANSDERMAL | Status: DC
  Administered 2019-03-30 – 2019-04-03 (×5): 14 mg via TRANSDERMAL
  Filled 2019-03-30 (×5): qty 1

## 2019-03-30 MED ORDER — PANTOPRAZOLE SODIUM 40 MG IV SOLR
40.0000 mg | Freq: Once | INTRAVENOUS | Status: AC
  Administered 2019-03-30: 15:00:00 40 mg via INTRAVENOUS
  Filled 2019-03-30: qty 40

## 2019-03-30 MED ORDER — HYDROMORPHONE HCL 1 MG/ML IJ SOLN
1.0000 mg | INTRAMUSCULAR | Status: DC | PRN
  Administered 2019-03-30 – 2019-04-02 (×12): 1 mg via INTRAVENOUS
  Filled 2019-03-30 (×12): qty 1

## 2019-03-30 MED ORDER — HYDROMORPHONE HCL 2 MG/ML IJ SOLN: 1.0000 mg | INTRAMUSCULAR | Status: DC | PRN

## 2019-03-30 MED ORDER — LORAZEPAM 2 MG/ML IJ SOLN
1.0000 mg | INTRAMUSCULAR | Status: AC | PRN
  Administered 2019-03-30 – 2019-04-02 (×4): 2 mg via INTRAVENOUS
  Filled 2019-03-30 (×4): qty 1

## 2019-03-30 MED ORDER — PANTOPRAZOLE SODIUM 40 MG IV SOLR
40.0000 mg | Freq: Two times a day (BID) | INTRAVENOUS | Status: DC
  Administered 2019-03-30 – 2019-04-02 (×6): 40 mg via INTRAVENOUS
  Filled 2019-03-30 (×6): qty 40

## 2019-03-30 MED ORDER — THIAMINE HCL 100 MG/ML IJ SOLN
100.0000 mg | Freq: Every day | INTRAMUSCULAR | Status: DC
  Administered 2019-03-31: 08:00:00 100 mg via INTRAVENOUS
  Filled 2019-03-30: qty 2

## 2019-03-30 MED ORDER — ONDANSETRON HCL 4 MG/2ML IJ SOLN
4.0000 mg | Freq: Once | INTRAMUSCULAR | Status: AC
  Administered 2019-03-30: 14:00:00 4 mg via INTRAVENOUS
  Filled 2019-03-30: qty 2

## 2019-03-30 MED ORDER — IOHEXOL 300 MG/ML  SOLN
100.0000 mL | Freq: Once | INTRAMUSCULAR | Status: AC | PRN
  Administered 2019-03-30: 100 mL via INTRAVENOUS

## 2019-03-30 MED ORDER — DEXTROSE-NACL 5-0.9 % IV SOLN
INTRAVENOUS | Status: DC
  Administered 2019-03-30 – 2019-04-03 (×8): via INTRAVENOUS

## 2019-03-30 MED ORDER — INSULIN ASPART 100 UNIT/ML ~~LOC~~ SOLN
0.0000 [IU] | Freq: Three times a day (TID) | SUBCUTANEOUS | Status: DC
  Administered 2019-03-31 – 2019-04-03 (×4): 1 [IU] via SUBCUTANEOUS
  Filled 2019-03-30: qty 0.09

## 2019-03-30 MED ORDER — SODIUM CHLORIDE (PF) 0.9 % IJ SOLN
INTRAMUSCULAR | Status: AC
  Filled 2019-03-30: qty 50

## 2019-03-30 MED ORDER — ONDANSETRON HCL 4 MG PO TABS: 4.0000 mg | ORAL_TABLET | Freq: Four times a day (QID) | ORAL | Status: DC | PRN

## 2019-03-30 MED ORDER — HYDRALAZINE HCL 20 MG/ML IJ SOLN
10.0000 mg | Freq: Four times a day (QID) | INTRAMUSCULAR | Status: DC | PRN
  Administered 2019-03-30 – 2019-04-01 (×3): 10 mg via INTRAVENOUS
  Filled 2019-03-30 (×3): qty 1

## 2019-03-30 MED ORDER — ONDANSETRON HCL 4 MG/2ML IJ SOLN
4.0000 mg | Freq: Four times a day (QID) | INTRAMUSCULAR | Status: DC | PRN
  Administered 2019-04-02: 4 mg via INTRAVENOUS
  Filled 2019-03-30: qty 2

## 2019-03-30 MED ORDER — MORPHINE SULFATE (PF) 4 MG/ML IV SOLN
4.0000 mg | Freq: Once | INTRAVENOUS | Status: AC
  Administered 2019-03-30: 14:00:00 4 mg via INTRAVENOUS
  Filled 2019-03-30: qty 1

## 2019-03-30 MED ORDER — SODIUM CHLORIDE 0.9 % IV BOLUS
1000.0000 mL | Freq: Once | INTRAVENOUS | Status: AC
  Administered 2019-03-30: 14:00:00 1000 mL via INTRAVENOUS

## 2019-03-30 MED ORDER — POTASSIUM CHLORIDE 10 MEQ/100ML IV SOLN
10.0000 meq | INTRAVENOUS | Status: AC
  Administered 2019-03-30 (×3): 10 meq via INTRAVENOUS
  Filled 2019-03-30 (×3): qty 100

## 2019-03-30 MED ORDER — HYDROMORPHONE HCL 1 MG/ML IJ SOLN
1.0000 mg | Freq: Once | INTRAMUSCULAR | Status: AC
  Administered 2019-03-30: 1 mg via INTRAVENOUS
  Filled 2019-03-30: qty 1

## 2019-03-30 MED ORDER — FLUTICASONE PROPIONATE 50 MCG/ACT NA SUSP: 1.0000 | Freq: Every day | NASAL | Status: DC | PRN

## 2019-03-30 MED ORDER — ADULT MULTIVITAMIN W/MINERALS CH
1.0000 | ORAL_TABLET | Freq: Every day | ORAL | Status: DC
  Administered 2019-04-01 – 2019-04-03 (×3): 1 via ORAL
  Filled 2019-03-30 (×3): qty 1

## 2019-03-30 MED ORDER — VITAMIN B-1 100 MG PO TABS
100.0000 mg | ORAL_TABLET | Freq: Every day | ORAL | Status: DC
  Administered 2019-04-01 – 2019-04-03 (×3): 100 mg via ORAL
  Filled 2019-03-30 (×3): qty 1

## 2019-03-30 MED ORDER — METOPROLOL TARTRATE 5 MG/5ML IV SOLN
5.0000 mg | Freq: Three times a day (TID) | INTRAVENOUS | Status: DC
  Administered 2019-03-30 – 2019-04-03 (×9): 5 mg via INTRAVENOUS
  Filled 2019-03-30 (×12): qty 5

## 2019-03-30 MED ORDER — FOLIC ACID 1 MG PO TABS
1.0000 mg | ORAL_TABLET | Freq: Every day | ORAL | Status: DC
  Administered 2019-04-01 – 2019-04-03 (×3): 1 mg via ORAL
  Filled 2019-03-30 (×3): qty 1

## 2019-03-30 MED ORDER — NITROGLYCERIN 0.4 MG SL SUBL: 0.4000 mg | SUBLINGUAL_TABLET | SUBLINGUAL | Status: DC | PRN

## 2019-03-30 MED ORDER — POLYVINYL ALCOHOL 1.4 % OP SOLN: 1.0000 [drp] | OPHTHALMIC | Status: DC | PRN

## 2019-03-30 MED ORDER — LORAZEPAM 1 MG PO TABS
1.0000 mg | ORAL_TABLET | ORAL | Status: AC | PRN
  Administered 2019-04-02 (×2): 1 mg via ORAL
  Filled 2019-03-30 (×2): qty 1

## 2019-03-30 NOTE — ED Notes (Signed)
Patient transported to CT 
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
## 2019-03-30 NOTE — ED Triage Notes (Signed)
Per EMS, patient from home, c/o LUQ pain x1 week with N/V. Reports one dark stool today.  20g L AC BP 170/110 HR 98 O2 100% CBG 98
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
## 2019-03-31 ENCOUNTER — Encounter (HOSPITAL_COMMUNITY): Payer: Self-pay | Admitting: Surgery

## 2019-03-31 ENCOUNTER — Observation Stay (HOSPITAL_COMMUNITY): Payer: Medicaid Other | Admitting: Certified Registered Nurse Anesthetist

## 2019-03-31 ENCOUNTER — Encounter (HOSPITAL_COMMUNITY)
Admission: EM | Disposition: A | Discharge status: Home / Self Care | Payer: Self-pay | Source: Home / Self Care | Attending: Family Medicine

## 2019-03-31 DIAGNOSIS — K259 Gastric ulcer, unspecified as acute or chronic, without hemorrhage or perforation: Secondary | ICD-10-CM | POA: Diagnosis not present

## 2019-03-31 DIAGNOSIS — K224 Dyskinesia of esophagus: Secondary | ICD-10-CM | POA: Diagnosis not present

## 2019-03-31 DIAGNOSIS — F1010 Alcohol abuse, uncomplicated: Secondary | ICD-10-CM | POA: Diagnosis not present

## 2019-03-31 DIAGNOSIS — R1084 Generalized abdominal pain: Secondary | ICD-10-CM | POA: Diagnosis not present

## 2019-03-31 DIAGNOSIS — Z8711 Personal history of peptic ulcer disease: Secondary | ICD-10-CM

## 2019-03-31 DIAGNOSIS — B9681 Helicobacter pylori [H. pylori] as the cause of diseases classified elsewhere: Secondary | ICD-10-CM | POA: Diagnosis present

## 2019-03-31 DIAGNOSIS — K921 Melena: Secondary | ICD-10-CM | POA: Diagnosis not present

## 2019-03-31 DIAGNOSIS — I2510 Atherosclerotic heart disease of native coronary artery without angina pectoris: Secondary | ICD-10-CM | POA: Diagnosis present

## 2019-03-31 DIAGNOSIS — E876 Hypokalemia: Secondary | ICD-10-CM | POA: Diagnosis present

## 2019-03-31 DIAGNOSIS — K9189 Other postprocedural complications and disorders of digestive system: Secondary | ICD-10-CM | POA: Diagnosis not present

## 2019-03-31 DIAGNOSIS — E119 Type 2 diabetes mellitus without complications: Secondary | ICD-10-CM

## 2019-03-31 DIAGNOSIS — Z8719 Personal history of other diseases of the digestive system: Secondary | ICD-10-CM

## 2019-03-31 DIAGNOSIS — R1012 Left upper quadrant pain: Secondary | ICD-10-CM | POA: Diagnosis not present

## 2019-03-31 DIAGNOSIS — R1032 Left lower quadrant pain: Secondary | ICD-10-CM | POA: Diagnosis not present

## 2019-03-31 DIAGNOSIS — R112 Nausea with vomiting, unspecified: Secondary | ICD-10-CM | POA: Diagnosis not present

## 2019-03-31 DIAGNOSIS — I252 Old myocardial infarction: Secondary | ICD-10-CM | POA: Diagnosis not present

## 2019-03-31 DIAGNOSIS — K922 Gastrointestinal hemorrhage, unspecified: Secondary | ICD-10-CM | POA: Diagnosis not present

## 2019-03-31 DIAGNOSIS — H409 Unspecified glaucoma: Secondary | ICD-10-CM | POA: Diagnosis present

## 2019-03-31 DIAGNOSIS — Z933 Colostomy status: Secondary | ICD-10-CM

## 2019-03-31 DIAGNOSIS — I160 Hypertensive urgency: Secondary | ICD-10-CM | POA: Diagnosis present

## 2019-03-31 DIAGNOSIS — K9589 Other complications of other bariatric procedure: Secondary | ICD-10-CM | POA: Diagnosis not present

## 2019-03-31 DIAGNOSIS — K317 Polyp of stomach and duodenum: Secondary | ICD-10-CM | POA: Diagnosis present

## 2019-03-31 DIAGNOSIS — E1136 Type 2 diabetes mellitus with diabetic cataract: Secondary | ICD-10-CM | POA: Diagnosis not present

## 2019-03-31 DIAGNOSIS — K219 Gastro-esophageal reflux disease without esophagitis: Secondary | ICD-10-CM | POA: Diagnosis not present

## 2019-03-31 DIAGNOSIS — K250 Acute gastric ulcer with hemorrhage: Secondary | ICD-10-CM | POA: Diagnosis not present

## 2019-03-31 DIAGNOSIS — Y832 Surgical operation with anastomosis, bypass or graft as the cause of abnormal reaction of the patient, or of later complication, without mention of misadventure at the time of the procedure: Secondary | ICD-10-CM | POA: Diagnosis present

## 2019-03-31 DIAGNOSIS — I10 Essential (primary) hypertension: Secondary | ICD-10-CM | POA: Diagnosis present

## 2019-03-31 DIAGNOSIS — K2100 Gastro-esophageal reflux disease with esophagitis, without bleeding: Secondary | ICD-10-CM | POA: Diagnosis not present

## 2019-03-31 DIAGNOSIS — F1020 Alcohol dependence, uncomplicated: Secondary | ICD-10-CM | POA: Diagnosis present

## 2019-03-31 DIAGNOSIS — A048 Other specified bacterial intestinal infections: Secondary | ICD-10-CM | POA: Diagnosis not present

## 2019-03-31 DIAGNOSIS — K5730 Diverticulosis of large intestine without perforation or abscess without bleeding: Secondary | ICD-10-CM | POA: Diagnosis not present

## 2019-03-31 DIAGNOSIS — K2920 Alcoholic gastritis without bleeding: Secondary | ICD-10-CM | POA: Diagnosis not present

## 2019-03-31 DIAGNOSIS — E785 Hyperlipidemia, unspecified: Secondary | ICD-10-CM | POA: Diagnosis not present

## 2019-03-31 DIAGNOSIS — E871 Hypo-osmolality and hyponatremia: Secondary | ICD-10-CM | POA: Diagnosis not present

## 2019-03-31 DIAGNOSIS — K2950 Unspecified chronic gastritis without bleeding: Secondary | ICD-10-CM | POA: Diagnosis not present

## 2019-03-31 DIAGNOSIS — Z681 Body mass index (BMI) 19 or less, adult: Secondary | ICD-10-CM | POA: Diagnosis not present

## 2019-03-31 DIAGNOSIS — E43 Unspecified severe protein-calorie malnutrition: Secondary | ICD-10-CM | POA: Diagnosis not present

## 2019-03-31 DIAGNOSIS — Z903 Acquired absence of stomach [part of]: Secondary | ICD-10-CM

## 2019-03-31 DIAGNOSIS — Z20828 Contact with and (suspected) exposure to other viral communicable diseases: Secondary | ICD-10-CM | POA: Diagnosis not present

## 2019-03-31 DIAGNOSIS — Z9884 Bariatric surgery status: Secondary | ICD-10-CM | POA: Diagnosis not present

## 2019-03-31 HISTORY — PX: ESOPHAGOGASTRODUODENOSCOPY (EGD) WITH PROPOFOL: SHX5813

## 2019-03-31 HISTORY — PX: BIOPSY: SHX5522

## 2019-03-31 LAB — COMPREHENSIVE METABOLIC PANEL
ALT: 14 U/L (ref 0–44)
AST: 21 U/L (ref 15–41)
Albumin: 3.2 g/dL — ABNORMAL LOW (ref 3.5–5.0)
Alkaline Phosphatase: 65 U/L (ref 38–126)
Anion gap: 7 (ref 5–15)
BUN: 10 mg/dL (ref 8–23)
CO2: 23 mmol/L (ref 22–32)
Calcium: 8.6 mg/dL — ABNORMAL LOW (ref 8.9–10.3)
Chloride: 107 mmol/L (ref 98–111)
Creatinine, Ser: 0.86 mg/dL (ref 0.61–1.24)
GFR calc Af Amer: >60 mL/min (ref >60)
GFR calc non Af Amer: >60 mL/min (ref >60)
Glucose, Bld: 91 mg/dL (ref 70–99)
Potassium: 3.5 mmol/L (ref 3.5–5.1)
Sodium: 137 mmol/L (ref 135–145)
Total Bilirubin: 0.8 mg/dL (ref 0.3–1.2)
Total Protein: 6.2 g/dL — ABNORMAL LOW (ref 6.5–8.1)

## 2019-03-31 LAB — CBC
HCT: 35.2 % — ABNORMAL LOW (ref 39.0–52.0)
Hemoglobin: 11.6 g/dL — ABNORMAL LOW (ref 13.0–17.0)
MCH: 25.3 pg — ABNORMAL LOW (ref 26.0–34.0)
MCHC: 33 g/dL (ref 30.0–36.0)
MCV: 76.9 fL — ABNORMAL LOW (ref 80.0–100.0)
Platelets: 282 10*3/uL (ref 150–400)
RBC: 4.58 MIL/uL (ref 4.22–5.81)
RDW: 15 % (ref 11.5–15.5)
WBC: 4.4 10*3/uL (ref 4.0–10.5)
nRBC: 0 % (ref 0.0–0.2)

## 2019-03-31 LAB — HEMOGLOBIN AND HEMATOCRIT, BLOOD
HCT: 34.7 % — ABNORMAL LOW (ref 39.0–52.0)
HCT: 36 % — ABNORMAL LOW (ref 39.0–52.0)
Hemoglobin: 11.7 g/dL — ABNORMAL LOW (ref 13.0–17.0)
Hemoglobin: 11.8 g/dL — ABNORMAL LOW (ref 13.0–17.0)

## 2019-03-31 LAB — GLUCOSE, CAPILLARY
Glucose-Capillary: 85 mg/dL (ref 70–99)
Glucose-Capillary: 100 mg/dL — ABNORMAL HIGH (ref 70–99)
Glucose-Capillary: 138 mg/dL — ABNORMAL HIGH (ref 70–99)
Glucose-Capillary: 105 mg/dL — ABNORMAL HIGH (ref 70–99)

## 2019-03-31 LAB — SARS CORONAVIRUS 2 (TAT 6-24 HRS): SARS Coronavirus 2: NEGATIVE

## 2019-03-31 SURGERY — ESOPHAGOGASTRODUODENOSCOPY (EGD) WITH PROPOFOL
Anesthesia: Monitor Anesthesia Care

## 2019-03-31 MED ORDER — PROPOFOL 10 MG/ML IV BOLUS
INTRAVENOUS | Status: AC
  Filled 2019-03-31: qty 60

## 2019-03-31 MED ORDER — LIDOCAINE HCL (CARDIAC) PF 100 MG/5ML IV SOSY
PREFILLED_SYRINGE | INTRAVENOUS | Status: DC | PRN
  Administered 2019-03-31: 50 mg via INTRAVENOUS

## 2019-03-31 MED ORDER — PROPOFOL 500 MG/50ML IV EMUL
INTRAVENOUS | Status: DC | PRN
  Administered 2019-03-31: 225 ug/kg/min via INTRAVENOUS

## 2019-03-31 MED ORDER — SODIUM CHLORIDE 0.9 % IV SOLN: INTRAVENOUS | Status: DC

## 2019-03-31 MED ORDER — LACTATED RINGERS IV SOLN: INTRAVENOUS | Status: DC

## 2019-03-31 MED ORDER — AMLODIPINE BESYLATE 5 MG PO TABS
5.0000 mg | ORAL_TABLET | Freq: Every day | ORAL | Status: DC
  Administered 2019-03-31 – 2019-04-02 (×3): 5 mg via ORAL
  Filled 2019-03-31 (×3): qty 1

## 2019-03-31 MED ORDER — GLYCOPYRROLATE 0.2 MG/ML IJ SOLN
INTRAMUSCULAR | Status: DC | PRN
  Administered 2019-03-31: 0.1 mg via INTRAVENOUS

## 2019-03-31 MED ORDER — LACTATED RINGERS IV SOLN
INTRAVENOUS | Status: DC | PRN
  Administered 2019-03-31: 13:00:00 via INTRAVENOUS

## 2019-03-31 MED ORDER — ONDANSETRON HCL 4 MG/2ML IJ SOLN
INTRAMUSCULAR | Status: DC | PRN
  Administered 2019-03-31: 4 mg via INTRAVENOUS

## 2019-03-31 SURGICAL SUPPLY — 15 items
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
## 2019-03-31 NOTE — Transfer of Care (Signed)
Immediate Anesthesia Transfer of Care Note  Patient: Troy Mclaughlin  Procedure(s) Performed: ESOPHAGOGASTRODUODENOSCOPY (EGD) WITH PROPOFOL (N/A ) BIOPSY  Patient Location: PACU  Anesthesia Type:MAC  Level of Consciousness: awake, sedated and patient cooperative  Airway & Oxygen Therapy: Patient Spontanous Breathing and Patient connected to nasal cannula oxygen  Post-op Assessment: Report given to RN, Post -op Vital signs reviewed and stable and Patient moving all extremities X 4  Post vital signs: stable  Last Vitals:  Vitals Value Taken Time  BP 167/82 03/31/19 1358  Temp 36.8 C 03/31/19 1358  Pulse 70 03/31/19 1401  Resp 12 03/31/19 1401  SpO2 100 % 03/31/19 1401  Vitals shown include unvalidated device data.  Last Pain:  Vitals:   03/31/19 1358  TempSrc: Temporal  PainSc: 0-No pain      Patients Stated Pain Goal: 3 (32/[id number]/91 [id number])  Complications: No apparent anesthesia complications

## 2019-03-31 NOTE — Interval H&P Note (Signed)
History and Physical Interval Note:  03/31/2019 1:26 PM  Troy Mclaughlin  has presented today for surgery, with the diagnosis of Nausea, vomiting, anemia, abnormal CT scan.  The various methods of treatment have been discussed with the patient and family. After consideration of risks, benefits and other options for treatment, the patient has consented to  Procedure(s): ESOPHAGOGASTRODUODENOSCOPY (EGD) WITH PROPOFOL (N/A) as a surgical intervention.  The patient's history has been reviewed, patient examined, no change in status, stable for surgery.  I have reviewed the patient's chart and labs.  Questions were answered to the patient's satisfaction.     [facility]

## 2019-03-31 NOTE — Anesthesia Preprocedure Evaluation (Signed)
Anesthesia Evaluation  Patient identified by MRN, date of birth, ID band Patient awake    Reviewed: Allergy & Precautions, H&P , NPO status , Patient's Chart, lab work & pertinent test results  Airway Mallampati: II   Neck ROM: full    Dental   Pulmonary Current Smoker,    breath sounds clear to auscultation       Cardiovascular hypertension, + CAD and + Past MI   Rhythm:regular Rate:Normal     Neuro/Psych  Headaches, CVA    GI/Hepatic PUD, GERD  ,(+)     substance abuse  alcohol use,   Endo/Other  diabetes  Renal/GU      Musculoskeletal   Abdominal   Peds  Hematology   Anesthesia Other Findings   Reproductive/Obstetrics                             Anesthesia Physical Anesthesia Plan  ASA: III  Anesthesia Plan: MAC   Post-op Pain Management:    Induction: Intravenous  PONV Risk Score and Plan: Propofol infusion and Treatment may vary due to age or medical condition  Airway Management Planned: Nasal Cannula  Additional Equipment:   Intra-op Plan:   Post-operative Plan:   Informed Consent: I have reviewed the patients History and Physical, chart, labs and discussed the procedure including the risks, benefits and alternatives for the proposed anesthesia with the patient or authorized representative who has indicated his/her understanding and acceptance.       Plan Discussed with: CRNA, Anesthesiologist and Surgeon  Anesthesia Plan Comments:         Anesthesia Quick Evaluation

## 2019-03-31 NOTE — Anesthesia Procedure Notes (Signed)
Procedure Name: MAC Date/Time: 03/31/2019 1:30 PM Performed by: Lissa Morales, CRNA Pre-anesthesia Checklist: Patient identified, Emergency Drugs available, Suction available, Patient being monitored and Timeout performed Patient Re-evaluated:Patient Re-evaluated prior to induction Oxygen Delivery Method: Nasal cannula Placement Confirmation: positive ETCO2
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
## 2019-03-31 NOTE — Op Note (Signed)
[facility] Patient Name: Troy Mclaughlin Procedure Date: 03/31/2019 MRN: [id number] Attending MD: Thornton Park MD, MD Date of Birth: 09-Apr-1956 CSN: [id number] Age: 63 Admit Type: Inpatient Procedure:                Upper GI endoscopy Indications:              Abnormal CT of the GI tract, Nausea with vomiting                           Nausea, vomiting, and LLQ abdominal pain                           Melena x 1                           FOBT+ without anemia                           H pylori gastritis 2018 treated with Pylori                           History of gastric bypass not taking any                            supplements or vitamins                           Abnormal CT scan: Several proximal loop                            fluid-filled loops of small bowel comprising the                            bypassed limb appear to fluid filled                           Daily alcohol use                           Family history of colon cancer (mother in her 77s,                            sister at 55) Providers:                Thornton Park MD, MD, Grace Isaac, RN, Laverda Sorenson, Technician, Enrigue Catena, CRNA Referring MD:              Medicines:                Monitored Anesthesia Care Complications:            No immediate complications. Estimated blood loss:                            Minimal. Estimated Blood Loss:     Estimated blood loss was minimal. Procedure:  Pre-Anesthesia Assessment:                           - Prior to the procedure, a History and Physical                            was performed, and patient medications and                            allergies were reviewed. The patient's tolerance of                            previous anesthesia was also reviewed. The risks                            and benefits of the procedure and the sedation                            options and risks were discussed  with the patient.                            All questions were answered, and informed consent                            was obtained. Prior Anticoagulants: The patient has                            taken no previous anticoagulant or antiplatelet                            agents. ASA Grade Assessment: III - A patient with                            severe systemic disease. After reviewing the risks                            and benefits, the patient was deemed in                            satisfactory condition to undergo the procedure.                           After obtaining informed consent, the endoscope was                            passed under direct vision. Throughout the                            procedure, the patient's blood pressure, pulse, and                            oxygen saturations were monitored continuously. The  [id number] Olympus gastroscope was                            introduced through the mouth, and advanced to the                            second part of duodenum. The upper GI endoscopy was                            accomplished without difficulty. The patient                            tolerated the procedure well. Scope In: Scope Out: Findings:      LA Class A reflux esophagitis. The remainder of the examined esophagus       was normal.      Evidence of a gastric bypass was found. A gastric pouch was found. This       was traversed. The pouch-to-jejunum limb was characterized by healthy       appearing mucosa. The anatomy of his anastomosis appears to set him up       for delayed gastric emptying as food and liquids likely pool in the       pouch.      One non-bleeding cratered gastric ulcer with no stigmata of bleeding was       found in the gastric antrum. This did not appear to involve the       anastamosis. The lesion was 8 mm in largest dimension. There was no       active bleeding, adherent clot, or  overlying pegmintation. Biopsies were       taken with a cold forceps for histology.      The examined jejunum was normal. Impression:               - LA Class A reflux esophagitis.                           - Gastric bypass.                           - Non-bleeding gastric ulcer with no stigmata of                            bleeding in the antrum. Biopsied.                           - Normal examined jejunum.                           - His nausea and vomiting is likely due to reflux                            esophagitis, peptic ulcer disease, as well as his                            gastric anatomy. Moderate Sedation:      Not Applicable - Patient had care per Anesthesia. Recommendation:           -  Return patient to hospital ward for ongoing care.                           - Advance diet as tolerated.                           - Continue present medications. Avoid all NSAIDs.                           - PPI BID x 8 weeks.                           - Await pathology results.                           - UGI series to further evaluate his anatomy, as                            suspect chronic nausea and vomiting due to his                            post-surgical anatomy. I will follow-up on this as                            an outpatient.                           - Repeat upper endoscopy in 8 weeks to evaluate the                            response to therapy.                           - Screening colonoscopy recommended as an                            outpatient.                           - GI will move to stand-by. Please call the on-call                            gastroenterologist with any additional questions or                            concerns during this hospitalization. Procedure Code(s):        --- Professional ---                           [phone], Esophagogastroduodenoscopy, flexible,                            transoral; with biopsy, single or multiple Diagnosis  Code(s):        --- Professional ---  Z98.84, Bariatric surgery status                           K25.9, Gastric ulcer, unspecified as acute or                            chronic, without hemorrhage or perforation                           R11.2, Nausea with vomiting, unspecified                           R93.3, Abnormal findings on diagnostic imaging of                            other parts of digestive tract CPT copyright 2019 [employer]. All rights reserved. The codes documented in this report are preliminary and upon coder review may  be revised to meet current compliance requirements. Thornton Park MD, MD 03/31/2019 2:25:06 PM This report has been signed electronically. Number of Addenda: 0

## 2019-04-01 ENCOUNTER — Inpatient Hospital Stay (HOSPITAL_COMMUNITY): Payer: Medicaid Other

## 2019-04-01 ENCOUNTER — Encounter (HOSPITAL_COMMUNITY): Payer: Self-pay | Admitting: Gastroenterology

## 2019-04-01 DIAGNOSIS — E43 Unspecified severe protein-calorie malnutrition: Secondary | ICD-10-CM | POA: Insufficient documentation

## 2019-04-01 LAB — COMPREHENSIVE METABOLIC PANEL
ALT: 14 U/L (ref 0–44)
AST: 20 U/L (ref 15–41)
Albumin: 2.7 g/dL — ABNORMAL LOW (ref 3.5–5.0)
Alkaline Phosphatase: 56 U/L (ref 38–126)
Anion gap: 7 (ref 5–15)
BUN: 6 mg/dL — ABNORMAL LOW (ref 8–23)
CO2: 23 mmol/L (ref 22–32)
Calcium: 8.6 mg/dL — ABNORMAL LOW (ref 8.9–10.3)
Chloride: 107 mmol/L (ref 98–111)
Creatinine, Ser: 0.73 mg/dL (ref 0.61–1.24)
GFR calc Af Amer: >60 mL/min (ref >60)
GFR calc non Af Amer: >60 mL/min (ref >60)
Glucose, Bld: 115 mg/dL — ABNORMAL HIGH (ref 70–99)
Potassium: 3.5 mmol/L (ref 3.5–5.1)
Sodium: 137 mmol/L (ref 135–145)
Total Bilirubin: 0.5 mg/dL (ref 0.3–1.2)
Total Protein: 5.5 g/dL — ABNORMAL LOW (ref 6.5–8.1)

## 2019-04-01 LAB — HEMOGLOBIN AND HEMATOCRIT, BLOOD
HCT: 33.7 % — ABNORMAL LOW (ref 39.0–52.0)
HCT: 35.2 % — ABNORMAL LOW (ref 39.0–52.0)
Hemoglobin: 11 g/dL — ABNORMAL LOW (ref 13.0–17.0)
Hemoglobin: 11.7 g/dL — ABNORMAL LOW (ref 13.0–17.0)

## 2019-04-01 LAB — CBC
HCT: 33.7 % — ABNORMAL LOW (ref 39.0–52.0)
Hemoglobin: 10.9 g/dL — ABNORMAL LOW (ref 13.0–17.0)
MCH: 25 pg — ABNORMAL LOW (ref 26.0–34.0)
MCHC: 32.3 g/dL (ref 30.0–36.0)
MCV: 77.3 fL — ABNORMAL LOW (ref 80.0–100.0)
Platelets: 237 10*3/uL (ref 150–400)
RBC: 4.36 MIL/uL (ref 4.22–5.81)
RDW: 15.1 % (ref 11.5–15.5)
WBC: 5.4 10*3/uL (ref 4.0–10.5)
nRBC: 0 % (ref 0.0–0.2)

## 2019-04-01 LAB — GLUCOSE, CAPILLARY
Glucose-Capillary: 109 mg/dL — ABNORMAL HIGH (ref 70–99)
Glucose-Capillary: 102 mg/dL — ABNORMAL HIGH (ref 70–99)
Glucose-Capillary: 134 mg/dL — ABNORMAL HIGH (ref 70–99)
Glucose-Capillary: 106 mg/dL — ABNORMAL HIGH (ref 70–99)

## 2019-04-01 MED ORDER — ENSURE ENLIVE PO LIQD
237.0000 mL | Freq: Three times a day (TID) | ORAL | Status: DC
  Administered 2019-04-01 – 2019-04-03 (×5): 237 mL via ORAL

## 2019-04-01 NOTE — Telephone Encounter (Signed)
Message left for Hanna[facility] requesting a call back to this CM .  Patient is currently hospitalized.  He has medicaid and can charge medications to an account at [facility] if he is not able to afford the copay. He is also eligible for a glucometer and BP monitor
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
## 2019-04-01 NOTE — Progress Notes (Signed)
Responded to spiritual care consult. Pt was alert and talking. No family present. Troy Mclaughlin mentioned his concern for his stool test and the results. He said that his family has a history of bad health. Also, he has some concerns about being discharged and how he going to get home. He said that he wanted to let the Social worker know about resources of transportation upon discharge.  I offered Troy Mclaughlin spiritual care with words of comfort, empathic listening, prayer and ministry of presence. He requested a follow up if he is still here at the end of the week.   Chaplain resident  Fidel Levy  [phone]
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
## 2019-04-02 DIAGNOSIS — F1010 Alcohol abuse, uncomplicated: Secondary | ICD-10-CM

## 2019-04-02 DIAGNOSIS — R1032 Left lower quadrant pain: Secondary | ICD-10-CM

## 2019-04-02 DIAGNOSIS — K250 Acute gastric ulcer with hemorrhage: Secondary | ICD-10-CM

## 2019-04-02 LAB — CBC
HCT: 35.2 % — ABNORMAL LOW (ref 39.0–52.0)
Hemoglobin: 11.4 g/dL — ABNORMAL LOW (ref 13.0–17.0)
MCH: 25.2 pg — ABNORMAL LOW (ref 26.0–34.0)
MCHC: 32.4 g/dL (ref 30.0–36.0)
MCV: 77.9 fL — ABNORMAL LOW (ref 80.0–100.0)
Platelets: 192 10*3/uL (ref 150–400)
RBC: 4.52 MIL/uL (ref 4.22–5.81)
RDW: 15 % (ref 11.5–15.5)
WBC: 5 10*3/uL (ref 4.0–10.5)
nRBC: 0 % (ref 0.0–0.2)

## 2019-04-02 LAB — MAGNESIUM: Magnesium: 1.6 mg/dL — ABNORMAL LOW (ref 1.7–2.4)

## 2019-04-02 LAB — COMPREHENSIVE METABOLIC PANEL
ALT: 14 U/L (ref 0–44)
AST: 20 U/L (ref 15–41)
Albumin: 2.8 g/dL — ABNORMAL LOW (ref 3.5–5.0)
Alkaline Phosphatase: 65 U/L (ref 38–126)
Anion gap: 8 (ref 5–15)
BUN: <5 mg/dL — ABNORMAL LOW (ref 8–23)
CO2: 21 mmol/L — ABNORMAL LOW (ref 22–32)
Calcium: 8.4 mg/dL — ABNORMAL LOW (ref 8.9–10.3)
Chloride: 105 mmol/L (ref 98–111)
Creatinine, Ser: 0.68 mg/dL (ref 0.61–1.24)
GFR calc Af Amer: >60 mL/min (ref >60)
GFR calc non Af Amer: >60 mL/min (ref >60)
Glucose, Bld: 96 mg/dL (ref 70–99)
Potassium: 3.8 mmol/L (ref 3.5–5.1)
Sodium: 134 mmol/L — ABNORMAL LOW (ref 135–145)
Total Bilirubin: 0.6 mg/dL (ref 0.3–1.2)
Total Protein: 5.6 g/dL — ABNORMAL LOW (ref 6.5–8.1)

## 2019-04-02 LAB — GLUCOSE, CAPILLARY
Glucose-Capillary: 144 mg/dL — ABNORMAL HIGH (ref 70–99)
Glucose-Capillary: 73 mg/dL (ref 70–99)
Glucose-Capillary: 99 mg/dL (ref 70–99)
Glucose-Capillary: 109 mg/dL — ABNORMAL HIGH (ref 70–99)

## 2019-04-02 LAB — PHOSPHORUS: Phosphorus: 3.1 mg/dL (ref 2.5–4.6)

## 2019-04-02 LAB — HEMOGLOBIN AND HEMATOCRIT, BLOOD
HCT: 34.7 % — ABNORMAL LOW (ref 39.0–52.0)
HCT: 36.4 % — ABNORMAL LOW (ref 39.0–52.0)
Hemoglobin: 11.3 g/dL — ABNORMAL LOW (ref 13.0–17.0)
Hemoglobin: 12.1 g/dL — ABNORMAL LOW (ref 13.0–17.0)

## 2019-04-02 LAB — SURGICAL PATHOLOGY

## 2019-04-02 MED ORDER — PANTOPRAZOLE SODIUM 40 MG PO TBEC
40.0000 mg | DELAYED_RELEASE_TABLET | Freq: Two times a day (BID) | ORAL | Status: DC
  Administered 2019-04-02 – 2019-04-03 (×2): 40 mg via ORAL
  Filled 2019-04-02 (×2): qty 1

## 2019-04-02 MED ORDER — AMLODIPINE BESYLATE 10 MG PO TABS
10.0000 mg | ORAL_TABLET | Freq: Every day | ORAL | Status: DC
  Administered 2019-04-02 – 2019-04-03 (×2): 10 mg via ORAL
  Filled 2019-04-02 (×2): qty 1

## 2019-04-02 MED ORDER — PANTOPRAZOLE SODIUM 40 MG PO TBEC: 40.0000 mg | DELAYED_RELEASE_TABLET | Freq: Two times a day (BID) | ORAL | Status: DC

## 2019-04-02 MED ORDER — MAGNESIUM SULFATE 2 GM/50ML IV SOLN
2.0000 g | Freq: Once | INTRAVENOUS | Status: AC
  Administered 2019-04-02: 2 g via INTRAVENOUS
  Filled 2019-04-02: qty 50
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
## 2019-04-02 NOTE — Progress Notes (Signed)
  Pathology from his EGD  11/8:   A. 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  - There is no evidence of dysplasia or malignancy.  - See comment.   B. STOMACH, POLYP:  - Densely inflamed polypoid gastric type mucosa.  - There is no evidence of malignancy.  Rx to begin at dc: Pylera 3 capsules po qid for 10 days in combo w Protonix 40 mg po qd x 8 weeks  Appt w Dr Havery Moros, set for 11/13, is too soon so will cxl and reschedule  S Daryle Boyington PA-C

## 2019-04-02 NOTE — Progress Notes (Addendum)
Pharmacy IV to PO conversion  The patient is ordered thiamine by the intravenous route with a linked PO option available.  Based on criteria approved by the Pharmacy and [place], the IV option is being discontinued.   No active GI bleeding or impaired absorption - does have recent GIB but Hgb stable and will continue on IV PPI; patient already choosing PO thiamine option  Not s/p esophagectomy  Documented ability to take oral medications for > 24 hr  Plan to continue treatment for at least 1 day  If you have any questions about this conversion, please contact the Pharmacy Department (ext [phone]).  Thank you.  Reuel Boom, PharmD, BCPS [phone] 04/02/2019, 1:12 PM

## 2019-04-02 NOTE — Anesthesia Postprocedure Evaluation (Signed)
Anesthesia Post Note  Patient: Troy Mclaughlin  Procedure(s) Performed: ESOPHAGOGASTRODUODENOSCOPY (EGD) WITH PROPOFOL (N/A ) BIOPSY     Patient location during evaluation: Endoscopy Anesthesia Type: MAC Level of consciousness: awake and alert Pain management: pain level controlled Vital Signs Assessment: post-procedure vital signs reviewed and stable Respiratory status: spontaneous breathing, nonlabored ventilation, respiratory function stable and patient connected to nasal cannula oxygen Cardiovascular status: blood pressure returned to baseline and stable Postop Assessment: no apparent nausea or vomiting Anesthetic complications: no    Last Vitals:  Vitals:   04/02/19 0506 04/02/19 0519  BP: (!) 143/99 (!) 152/110  Pulse: 66 67  Resp: 17   Temp: 36.8 C   SpO2: 100%     Last Pain:  Vitals:   04/02/19 0044  TempSrc:   PainSc: Asleep                 Tonea Leiphart S

## 2019-04-03 LAB — BASIC METABOLIC PANEL
Anion gap: 5 (ref 5–15)
BUN: 5 mg/dL — ABNORMAL LOW (ref 8–23)
CO2: 22 mmol/L (ref 22–32)
Calcium: 8.1 mg/dL — ABNORMAL LOW (ref 8.9–10.3)
Chloride: 106 mmol/L (ref 98–111)
Creatinine, Ser: 0.73 mg/dL (ref 0.61–1.24)
GFR calc Af Amer: >60 mL/min (ref >60)
GFR calc non Af Amer: >60 mL/min (ref >60)
Glucose, Bld: 100 mg/dL — ABNORMAL HIGH (ref 70–99)
Potassium: 3.7 mmol/L (ref 3.5–5.1)
Sodium: 133 mmol/L — ABNORMAL LOW (ref 135–145)

## 2019-04-03 LAB — CBC
HCT: 33.6 % — ABNORMAL LOW (ref 39.0–52.0)
Hemoglobin: 11 g/dL — ABNORMAL LOW (ref 13.0–17.0)
MCH: 25.1 pg — ABNORMAL LOW (ref 26.0–34.0)
MCHC: 32.7 g/dL (ref 30.0–36.0)
MCV: 76.5 fL — ABNORMAL LOW (ref 80.0–100.0)
Platelets: 224 10*3/uL (ref 150–400)
RBC: 4.39 MIL/uL (ref 4.22–5.81)
RDW: 14.8 % (ref 11.5–15.5)
WBC: 4.5 10*3/uL (ref 4.0–10.5)
nRBC: 0 % (ref 0.0–0.2)

## 2019-04-03 LAB — GLUCOSE, CAPILLARY
Glucose-Capillary: 121 mg/dL — ABNORMAL HIGH (ref 70–99)
Glucose-Capillary: 115 mg/dL — ABNORMAL HIGH (ref 70–99)

## 2019-04-03 MED ORDER — THIAMINE HCL 100 MG PO TABS: 100.0000 mg | ORAL_TABLET | Freq: Every day | ORAL | 0 refills | Status: DC

## 2019-04-03 MED ORDER — PANTOPRAZOLE SODIUM 40 MG PO TBEC: 40.0000 mg | DELAYED_RELEASE_TABLET | Freq: Every day | ORAL | 0 refills | Status: DC

## 2019-04-03 MED ORDER — FOLIC ACID 1 MG PO TABS: 1.0000 mg | ORAL_TABLET | Freq: Every day | ORAL | 0 refills | Status: DC

## 2019-04-03 MED ORDER — PYLERA 140-125-125 MG PO CAPS: 3.0000 | ORAL_CAPSULE | Freq: Four times a day (QID) | ORAL | 0 refills | Status: DC

## 2019-04-03 NOTE — TOC Progression Note (Signed)
Transition of Care [facility]) - Progression Note    Patient Details  Name: Troy Mclaughlin MRN: [id number] Date of Birth: 12-30-1955  Transition of Care [facility] CM/SW [place], LCSW Phone Number: 04/03/2019, 11:53 AM  Clinical Narrative:   Patient given taxi voucher to assist with discharge home          Expected Discharge Plan and Services           Expected Discharge Date: 04/03/19                                     Social Determinants of Health (SDOH) Interventions    Readmission Risk Interventions No flowsheet data found.
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
## 2019-04-04 ENCOUNTER — Telehealth: Payer: Self-pay

## 2019-04-04 NOTE — Telephone Encounter (Signed)
Transition Care Management Follow-up Telephone Call Date of discharge and from where: 04/03/2019, [facility].  Attempted to contact patient X 3 # [phone], message stated call not able to be completed at this time

## 2019-04-05 ENCOUNTER — Telehealth: Payer: Self-pay

## 2019-04-05 ENCOUNTER — Ambulatory Visit: Payer: Medicaid Other | Admitting: Gastroenterology

## 2019-04-05 NOTE — Telephone Encounter (Signed)
Transition Care Management Follow-up Telephone Call - attempt #2 Date of discharge and from where: 04/03/2019, [facility]  Attempted again to contact patient and the message states that calls are not being accepted at this time

## 2019-04-11 ENCOUNTER — Telehealth: Payer: Self-pay

## 2019-04-11 NOTE — Telephone Encounter (Signed)
Transition Care Management Follow-up Telephone Call Date of discharge and from where: 04/03/2019, [facility]    Call placed to # [phone], message states call is not able to be completed at this time.

## 2019-04-16 ENCOUNTER — Telehealth: Payer: Self-pay

## 2019-04-16 NOTE — Telephone Encounter (Signed)
Letter sent to patient informing him that we have not been able to reach him to schedule hospital follow up appointment

## 2019-04-24 ENCOUNTER — Encounter: Payer: Self-pay | Admitting: General Surgery

## 2019-04-24 ENCOUNTER — Ambulatory Visit: Payer: Medicaid Other | Admitting: Physician Assistant

## 2019-05-05 ENCOUNTER — Emergency Department (HOSPITAL_COMMUNITY): Payer: Medicaid Other

## 2019-05-05 ENCOUNTER — Encounter (HOSPITAL_COMMUNITY): Payer: Self-pay | Admitting: Emergency Medicine

## 2019-05-05 ENCOUNTER — Emergency Department (HOSPITAL_COMMUNITY)
Admission: EM | Admit: 2019-05-05 | Discharge: 2019-05-05 | Disposition: A | Discharge status: Home / Self Care | Payer: Medicaid Other | Attending: Emergency Medicine | Admitting: Emergency Medicine

## 2019-05-05 ENCOUNTER — Other Ambulatory Visit: Payer: Self-pay

## 2019-05-05 DIAGNOSIS — R079 Chest pain, unspecified: Secondary | ICD-10-CM | POA: Diagnosis not present

## 2019-05-05 DIAGNOSIS — B349 Viral infection, unspecified: Secondary | ICD-10-CM | POA: Insufficient documentation

## 2019-05-05 DIAGNOSIS — Z7984 Long term (current) use of oral hypoglycemic drugs: Secondary | ICD-10-CM | POA: Diagnosis not present

## 2019-05-05 DIAGNOSIS — R072 Precordial pain: Secondary | ICD-10-CM | POA: Diagnosis not present

## 2019-05-05 DIAGNOSIS — I10 Essential (primary) hypertension: Secondary | ICD-10-CM | POA: Diagnosis not present

## 2019-05-05 DIAGNOSIS — I2510 Atherosclerotic heart disease of native coronary artery without angina pectoris: Secondary | ICD-10-CM | POA: Insufficient documentation

## 2019-05-05 DIAGNOSIS — F17210 Nicotine dependence, cigarettes, uncomplicated: Secondary | ICD-10-CM | POA: Insufficient documentation

## 2019-05-05 DIAGNOSIS — Z79899 Other long term (current) drug therapy: Secondary | ICD-10-CM | POA: Insufficient documentation

## 2019-05-05 DIAGNOSIS — E119 Type 2 diabetes mellitus without complications: Secondary | ICD-10-CM | POA: Insufficient documentation

## 2019-05-05 DIAGNOSIS — Z20828 Contact with and (suspected) exposure to other viral communicable diseases: Secondary | ICD-10-CM | POA: Insufficient documentation

## 2019-05-05 DIAGNOSIS — R0789 Other chest pain: Secondary | ICD-10-CM | POA: Diagnosis present

## 2019-05-05 LAB — CBC
HCT: 38.7 % — ABNORMAL LOW (ref 39.0–52.0)
Hemoglobin: 13.1 g/dL (ref 13.0–17.0)
MCH: 25.6 pg — ABNORMAL LOW (ref 26.0–34.0)
MCHC: 33.9 g/dL (ref 30.0–36.0)
MCV: 75.6 fL — ABNORMAL LOW (ref 80.0–100.0)
Platelets: 187 10*3/uL (ref 150–400)
RBC: 5.12 MIL/uL (ref 4.22–5.81)
RDW: 15.2 % (ref 11.5–15.5)
WBC: 5.4 10*3/uL (ref 4.0–10.5)
nRBC: 0 % (ref 0.0–0.2)

## 2019-05-05 LAB — COMPREHENSIVE METABOLIC PANEL
ALT: 20 U/L (ref 0–44)
AST: 44 U/L — ABNORMAL HIGH (ref 15–41)
Albumin: 2.9 g/dL — ABNORMAL LOW (ref 3.5–5.0)
Alkaline Phosphatase: 79 U/L (ref 38–126)
Anion gap: 9 (ref 5–15)
BUN: 7 mg/dL — ABNORMAL LOW (ref 8–23)
CO2: 22 mmol/L (ref 22–32)
Calcium: 7.9 mg/dL — ABNORMAL LOW (ref 8.9–10.3)
Chloride: 106 mmol/L (ref 98–111)
Creatinine, Ser: 0.78 mg/dL (ref 0.61–1.24)
GFR calc Af Amer: >60 mL/min (ref >60)
GFR calc non Af Amer: >60 mL/min (ref >60)
Glucose, Bld: 74 mg/dL (ref 70–99)
Potassium: 4.7 mmol/L (ref 3.5–5.1)
Sodium: 137 mmol/L (ref 135–145)
Total Bilirubin: 0.3 mg/dL (ref 0.3–1.2)
Total Protein: 6.2 g/dL — ABNORMAL LOW (ref 6.5–8.1)

## 2019-05-05 LAB — TROPONIN I (HIGH SENSITIVITY)
Troponin I (High Sensitivity): 5 ng/L (ref <18)
Troponin I (High Sensitivity): 6 ng/L (ref <18)

## 2019-05-05 LAB — LIPASE, BLOOD: Lipase: 23 U/L (ref 11–51)

## 2019-05-05 MED ORDER — SODIUM CHLORIDE 0.9 % IV BOLUS
1000.0000 mL | Freq: Once | INTRAVENOUS | Status: AC
  Administered 2019-05-05: 1000 mL via INTRAVENOUS

## 2019-05-05 MED ORDER — KETOROLAC TROMETHAMINE 30 MG/ML IJ SOLN
15.0000 mg | Freq: Once | INTRAMUSCULAR | Status: AC
  Administered 2019-05-05: 15 mg via INTRAVENOUS
  Filled 2019-05-05: qty 1

## 2019-05-05 MED ORDER — ONDANSETRON 4 MG PO TBDP: ORAL_TABLET | ORAL | 0 refills | Status: DC

## 2019-05-05 MED ORDER — HYDROMORPHONE HCL 1 MG/ML IJ SOLN
1.0000 mg | Freq: Once | INTRAMUSCULAR | Status: AC
  Administered 2019-05-05: 1 mg via INTRAVENOUS
  Filled 2019-05-05: qty 1

## 2019-05-05 MED ORDER — BENZONATATE 100 MG PO CAPS: 100.0000 mg | ORAL_CAPSULE | Freq: Three times a day (TID) | ORAL | 0 refills | Status: DC

## 2019-05-05 MED ORDER — ALUM & MAG HYDROXIDE-SIMETH 200-200-20 MG/5ML PO SUSP
30.0000 mL | Freq: Once | ORAL | Status: AC
  Administered 2019-05-05: 30 mL via ORAL
  Filled 2019-05-05: qty 30

## 2019-05-05 MED ORDER — ACETAMINOPHEN 500 MG PO TABS
1000.0000 mg | ORAL_TABLET | Freq: Once | ORAL | Status: AC
  Administered 2019-05-05: 1000 mg via ORAL
  Filled 2019-05-05: qty 2

## 2019-05-05 MED ORDER — ONDANSETRON HCL 4 MG/2ML IJ SOLN
4.0000 mg | Freq: Once | INTRAMUSCULAR | Status: AC
  Administered 2019-05-05: 4 mg via INTRAVENOUS
  Filled 2019-05-05: qty 2

## 2019-05-05 MED ORDER — SODIUM CHLORIDE 0.9% FLUSH
3.0000 mL | Freq: Once | INTRAVENOUS | Status: AC
  Administered 2019-05-05: 3 mL via INTRAVENOUS

## 2019-05-05 MED ORDER — HYDROCOD POLST-CPM POLST ER 10-8 MG/5ML PO SUER
5.0000 mL | Freq: Once | ORAL | Status: AC
  Administered 2019-05-05: 5 mL via ORAL
  Filled 2019-05-05: qty 5

## 2019-05-05 NOTE — ED Triage Notes (Signed)
Pt reports having chest pain for the last 2 days along with vomiting and noted blood in emesis and when blowing nose.

## 2019-05-05 NOTE — ED Notes (Signed)
Lab called regarding lab orders cancelled by lab, lab tech stated they will be process

## 2019-05-05 NOTE — Discharge Instructions (Signed)
Take tylenol 2 pills 4 times a day.  Drink plenty of fluids.  Return for worsening shortness of breath, headache, confusion. Follow up with your family doctor.      Person Under Monitoring Name: Troy Mclaughlin [facility]  Location: [place] [address]   Infection Prevention Recommendations for Individuals Confirmed to have, or Being Evaluated for, 2019 Novel Coronavirus (COVID-19) Infection Who Receive Care at Home  Individuals who are confirmed to have, or are being evaluated for, COVID-19 should follow the prevention steps below until a healthcare provider or local or state health department says they can return to normal activities.  Stay home except to get medical care You should restrict activities outside your home, except for getting medical care. Do not go to work, school, or public areas, and do not use public transportation or taxis.  Call ahead before visiting your doctor Before your medical appointment, call the healthcare provider and tell them that you have, or are being evaluated for, COVID-19 infection. This will help the healthcare provider's office take steps to keep other people from getting infected. Ask your healthcare provider to call the local or state health department.  Monitor your symptoms Seek prompt medical attention if your illness is worsening (e.g., difficulty breathing). Before going to your medical appointment, call the healthcare provider and tell them that you have, or are being evaluated for, COVID-19 infection. Ask your healthcare provider to call the local or state health department.  Wear a facemask You should wear a facemask that covers your nose and mouth when you are in the same room with other people and when you visit a healthcare provider. People who live with or visit you should also wear a facemask while they are in the same room with you.  Separate yourself from other people in your home As much as possible, you should  stay in a different room from other people in your home. Also, you should use a separate bathroom, if available.  Avoid sharing household items You should not share dishes, drinking glasses, cups, eating utensils, towels, bedding, or other items with other people in your home. After using these items, you should wash them thoroughly with soap and water.  Cover your coughs and sneezes Cover your mouth and nose with a tissue when you cough or sneeze, or you can cough or sneeze into your sleeve. Throw used tissues in a lined trash can, and immediately wash your hands with soap and water for at least 20 seconds or use an alcohol-based hand rub.  Wash your [facility] your hands often and thoroughly with soap and water for at least 20 seconds. You can use an alcohol-based hand sanitizer if soap and water are not available and if your hands are not visibly dirty. Avoid touching your eyes, nose, and mouth with unwashed hands.   Prevention Steps for Caregivers and Household Members of Individuals Confirmed to have, or Being Evaluated for, COVID-19 Infection Being Cared for in the Home  If you live with, or provide care at home for, a person confirmed to have, or being evaluated for, COVID-19 infection please follow these guidelines to prevent infection:  Follow healthcare provider's instructions Make sure that you understand and can help the patient follow any healthcare provider instructions for all care.  Provide for the patient's basic needs You should help the patient with basic needs in the home and provide support for getting groceries, prescriptions, and other personal needs.  Monitor the patient's symptoms  If they are getting sicker, call his or her medical provider and tell them that the patient has, or is being evaluated for, COVID-19 infection. This will help the healthcare provider's office take steps to keep other people from getting infected. Ask the healthcare provider to call  the local or state health department.  Limit the number of people who have contact with the patient If possible, have only one caregiver for the patient. Other household members should stay in another home or place of residence. If this is not possible, they should stay in another room, or be separated from the patient as much as possible. Use a separate bathroom, if available. Restrict visitors who do not have an essential need to be in the home.  Keep older adults, very young children, and other sick people away from the patient Keep older adults, very young children, and those who have compromised immune systems or chronic health conditions away from the patient. This includes people with chronic heart, lung, or kidney conditions, diabetes, and cancer.  Ensure good ventilation Make sure that shared spaces in the home have good air flow, such as from an air conditioner or an opened window, weather permitting.  Wash your hands often Wash your hands often and thoroughly with soap and water for at least 20 seconds. You can use an alcohol based hand sanitizer if soap and water are not available and if your hands are not visibly dirty. Avoid touching your eyes, nose, and mouth with unwashed hands. Use disposable paper towels to dry your hands. If not available, use dedicated cloth towels and replace them when they become wet.  Wear a facemask and gloves Wear a disposable facemask at all times in the room and gloves when you touch or have contact with the patient's blood, body fluids, and/or secretions or excretions, such as sweat, saliva, sputum, nasal mucus, vomit, urine, or feces.  Ensure the mask fits over your nose and mouth tightly, and do not touch it during use. Throw out disposable facemasks and gloves after using them. Do not reuse. Wash your hands immediately after removing your facemask and gloves. If your personal clothing becomes contaminated, carefully remove clothing and launder.  Wash your hands after handling contaminated clothing. Place all used disposable facemasks, gloves, and other waste in a lined container before disposing them with other household waste. Remove gloves and wash your hands immediately after handling these items.  Do not share dishes, glasses, or other household items with the patient Avoid sharing household items. You should not share dishes, drinking glasses, cups, eating utensils, towels, bedding, or other items with a patient who is confirmed to have, or being evaluated for, COVID-19 infection. After the person uses these items, you should wash them thoroughly with soap and water.  Wash laundry thoroughly Immediately remove and wash clothes or bedding that have blood, body fluids, and/or secretions or excretions, such as sweat, saliva, sputum, nasal mucus, vomit, urine, or feces, on them. Wear gloves when handling laundry from the patient. Read and follow directions on labels of laundry or clothing items and detergent. In general, wash and dry with the warmest temperatures recommended on the label.  Clean all areas the individual has used often Clean all touchable surfaces, such as counters, tabletops, doorknobs, bathroom fixtures, toilets, phones, keyboards, tablets, and bedside tables, every day. Also, clean any surfaces that may have blood, body fluids, and/or secretions or excretions on them. Wear gloves when cleaning surfaces the patient has come in contact with.  Use a diluted bleach solution (e.g., dilute bleach with 1 part bleach and 10 parts water) or a household disinfectant with a label that says EPA-registered for coronaviruses. To make a bleach solution at home, add 1 tablespoon of bleach to 1 quart (4 cups) of water. For a larger supply, add  cup of bleach to 1 gallon (16 cups) of water. Read labels of cleaning products and follow recommendations provided on product labels. Labels contain instructions for safe and effective use of the  cleaning product including precautions you should take when applying the product, such as wearing gloves or eye protection and making sure you have good ventilation during use of the product. Remove gloves and wash hands immediately after cleaning.  Monitor yourself for signs and symptoms of illness Caregivers and household members are considered close contacts, should monitor their health, and will be asked to limit movement outside of the home to the extent possible. Follow the monitoring steps for close contacts listed on the symptom monitoring form.   ? If you have additional questions, contact your local health department or call the epidemiologist on call at [phone] (available 24/7). ? This guidance is subject to change. For the most up-to-date guidance from [employer], please refer to their website: [email or web address]
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
## 2019-05-06 ENCOUNTER — Other Ambulatory Visit: Payer: Self-pay

## 2019-05-06 ENCOUNTER — Emergency Department (HOSPITAL_COMMUNITY)
Admission: EM | Admit: 2019-05-06 | Discharge: 2019-05-06 | Disposition: A | Discharge status: Home / Self Care | Payer: Medicaid Other | Attending: Emergency Medicine | Admitting: Emergency Medicine

## 2019-05-06 ENCOUNTER — Emergency Department (HOSPITAL_COMMUNITY): Payer: Medicaid Other

## 2019-05-06 ENCOUNTER — Encounter (HOSPITAL_COMMUNITY): Payer: Self-pay

## 2019-05-06 DIAGNOSIS — R0789 Other chest pain: Secondary | ICD-10-CM | POA: Diagnosis not present

## 2019-05-06 DIAGNOSIS — F17210 Nicotine dependence, cigarettes, uncomplicated: Secondary | ICD-10-CM | POA: Diagnosis not present

## 2019-05-06 DIAGNOSIS — B349 Viral infection, unspecified: Secondary | ICD-10-CM | POA: Insufficient documentation

## 2019-05-06 DIAGNOSIS — E119 Type 2 diabetes mellitus without complications: Secondary | ICD-10-CM | POA: Insufficient documentation

## 2019-05-06 DIAGNOSIS — Z79899 Other long term (current) drug therapy: Secondary | ICD-10-CM | POA: Diagnosis not present

## 2019-05-06 DIAGNOSIS — I252 Old myocardial infarction: Secondary | ICD-10-CM | POA: Insufficient documentation

## 2019-05-06 DIAGNOSIS — I259 Chronic ischemic heart disease, unspecified: Secondary | ICD-10-CM | POA: Insufficient documentation

## 2019-05-06 DIAGNOSIS — I10 Essential (primary) hypertension: Secondary | ICD-10-CM | POA: Insufficient documentation

## 2019-05-06 DIAGNOSIS — Z7984 Long term (current) use of oral hypoglycemic drugs: Secondary | ICD-10-CM | POA: Diagnosis not present

## 2019-05-06 DIAGNOSIS — R0602 Shortness of breath: Secondary | ICD-10-CM | POA: Diagnosis not present

## 2019-05-06 LAB — URINALYSIS, ROUTINE W REFLEX MICROSCOPIC
Bilirubin Urine: NEGATIVE
Glucose, UA: NEGATIVE mg/dL
Hgb urine dipstick: NEGATIVE
Ketones, ur: 20 mg/dL — AB
Leukocytes,Ua: NEGATIVE
Nitrite: NEGATIVE
Protein, ur: NEGATIVE mg/dL
Specific Gravity, Urine: 1.016 (ref 1.005–1.030)
pH: 7 (ref 5.0–8.0)

## 2019-05-06 LAB — BASIC METABOLIC PANEL
Anion gap: 12 (ref 5–15)
BUN: 6 mg/dL — ABNORMAL LOW (ref 8–23)
CO2: 26 mmol/L (ref 22–32)
Calcium: 9.4 mg/dL (ref 8.9–10.3)
Chloride: 98 mmol/L (ref 98–111)
Creatinine, Ser: 0.89 mg/dL (ref 0.61–1.24)
GFR calc Af Amer: >60 mL/min (ref >60)
GFR calc non Af Amer: >60 mL/min (ref >60)
Glucose, Bld: 90 mg/dL (ref 70–99)
Potassium: 4.1 mmol/L (ref 3.5–5.1)
Sodium: 136 mmol/L (ref 135–145)

## 2019-05-06 LAB — CBC
HCT: 42.7 % (ref 39.0–52.0)
Hemoglobin: 14.7 g/dL (ref 13.0–17.0)
MCH: 25.5 pg — ABNORMAL LOW (ref 26.0–34.0)
MCHC: 34.4 g/dL (ref 30.0–36.0)
MCV: 74.1 fL — ABNORMAL LOW (ref 80.0–100.0)
Platelets: 185 10*3/uL (ref 150–400)
RBC: 5.76 MIL/uL (ref 4.22–5.81)
RDW: 14.9 % (ref 11.5–15.5)
WBC: 4.8 10*3/uL (ref 4.0–10.5)
nRBC: 0 % (ref 0.0–0.2)

## 2019-05-06 LAB — NOVEL CORONAVIRUS, NAA (HOSP ORDER, SEND-OUT TO REF LAB; TAT 18-24 HRS): SARS-CoV-2, NAA: NOT DETECTED

## 2019-05-06 MED ORDER — SODIUM CHLORIDE 0.9 % IV SOLN
INTRAVENOUS | Status: DC
  Administered 2019-05-06: 20:00:00 via INTRAVENOUS

## 2019-05-06 MED ORDER — IOHEXOL 350 MG/ML SOLN
75.0000 mL | Freq: Once | INTRAVENOUS | Status: AC | PRN
  Administered 2019-05-06: 75 mL via INTRAVENOUS

## 2019-05-06 MED ORDER — SODIUM CHLORIDE 0.9% FLUSH: 3.0000 mL | Freq: Once | INTRAVENOUS | Status: DC

## 2019-05-06 MED ORDER — ONDANSETRON HCL 4 MG/2ML IJ SOLN
4.0000 mg | Freq: Once | INTRAMUSCULAR | Status: AC
  Administered 2019-05-06: 4 mg via INTRAVENOUS
  Filled 2019-05-06: qty 2

## 2019-05-06 MED ORDER — HYDROCODONE-ACETAMINOPHEN 5-325 MG PO TABS: 1.0000 | ORAL_TABLET | Freq: Four times a day (QID) | ORAL | 0 refills | Status: DC | PRN

## 2019-05-06 MED ORDER — HYDROCODONE-ACETAMINOPHEN 5-325 MG PO TABS
1.0000 | ORAL_TABLET | Freq: Once | ORAL | Status: AC
  Administered 2019-05-06: 1 via ORAL
  Filled 2019-05-06: qty 1

## 2019-05-06 NOTE — ED Notes (Signed)
Patient verbalizes understanding of discharge instructions. Opportunity for questioning and answers were provided. Armband removed by staff, pt discharged from ED ambulatory.   
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
## 2019-05-06 NOTE — Discharge Instructions (Signed)
CT angio of chest negative for any pneumonia any tumors any blood clots in the lungs.  Symptoms consistent with chest wall pain.  Take the hydrocodone as directed.  Make an appointment follow-up with your regular doctor.  Your Covid testing from yesterday was negative.  Labs today and labs yesterday without any significant abnormalities.

## 2019-05-06 NOTE — ED Triage Notes (Signed)
Pt has multiple complaints: chest pain, coughing up blood, weakness and emesis for the past week, pt seen at [facility] yesterday and was told to come back if it got worse. Pt states his symptoms are worse. Pt a.o, resp e.u.

## 2019-05-08 ENCOUNTER — Ambulatory Visit: Payer: Medicaid Other | Attending: Internal Medicine

## 2019-05-08 ENCOUNTER — Other Ambulatory Visit: Payer: Self-pay

## 2019-07-08 ENCOUNTER — Other Ambulatory Visit: Payer: Self-pay

## 2019-07-08 ENCOUNTER — Emergency Department (HOSPITAL_COMMUNITY): Payer: Medicaid Other

## 2019-07-08 ENCOUNTER — Encounter (HOSPITAL_COMMUNITY): Payer: Self-pay | Admitting: Emergency Medicine

## 2019-07-08 ENCOUNTER — Emergency Department (HOSPITAL_COMMUNITY)
Admission: EM | Admit: 2019-07-08 | Discharge: 2019-07-08 | Disposition: A | Discharge status: Home / Self Care | Payer: Medicaid Other | Attending: Emergency Medicine | Admitting: Emergency Medicine

## 2019-07-08 DIAGNOSIS — Z5321 Procedure and treatment not carried out due to patient leaving prior to being seen by health care provider: Secondary | ICD-10-CM | POA: Insufficient documentation

## 2019-07-08 DIAGNOSIS — R0789 Other chest pain: Secondary | ICD-10-CM | POA: Diagnosis not present

## 2019-07-08 DIAGNOSIS — R55 Syncope and collapse: Secondary | ICD-10-CM | POA: Diagnosis not present

## 2019-07-08 DIAGNOSIS — R0602 Shortness of breath: Secondary | ICD-10-CM | POA: Insufficient documentation

## 2019-07-08 DIAGNOSIS — R079 Chest pain, unspecified: Secondary | ICD-10-CM | POA: Diagnosis not present

## 2019-07-08 LAB — BASIC METABOLIC PANEL
Anion gap: 11 (ref 5–15)
BUN: 9 mg/dL (ref 8–23)
CO2: 23 mmol/L (ref 22–32)
Calcium: 9.4 mg/dL (ref 8.9–10.3)
Chloride: 99 mmol/L (ref 98–111)
Creatinine, Ser: 1.04 mg/dL (ref 0.61–1.24)
GFR calc Af Amer: >60 mL/min (ref >60)
GFR calc non Af Amer: >60 mL/min (ref >60)
Glucose, Bld: 85 mg/dL (ref 70–99)
Potassium: 3.7 mmol/L (ref 3.5–5.1)
Sodium: 133 mmol/L — ABNORMAL LOW (ref 135–145)

## 2019-07-08 LAB — CBC
HCT: 40.4 % (ref 39.0–52.0)
Hemoglobin: 13.6 g/dL (ref 13.0–17.0)
MCH: 25.2 pg — ABNORMAL LOW (ref 26.0–34.0)
MCHC: 33.7 g/dL (ref 30.0–36.0)
MCV: 74.8 fL — ABNORMAL LOW (ref 80.0–100.0)
Platelets: 163 10*3/uL (ref 150–400)
RBC: 5.4 MIL/uL (ref 4.22–5.81)
RDW: 14.7 % (ref 11.5–15.5)
WBC: 5.1 10*3/uL (ref 4.0–10.5)
nRBC: 0 % (ref 0.0–0.2)

## 2019-07-08 LAB — TROPONIN I (HIGH SENSITIVITY): Troponin I (High Sensitivity): 9 ng/L (ref <18)

## 2019-07-08 NOTE — ED Triage Notes (Signed)
Pt reports left sided chest pain for a week. States that when the pain hits, it feels like he is going to pass out. Endorses SOB

## 2019-07-08 NOTE — ED Notes (Signed)
Pt refused second blood draw, told phlebotomy he was going to call his wife to go home.

## 2019-07-09 ENCOUNTER — Emergency Department (HOSPITAL_COMMUNITY): Payer: Medicaid Other

## 2019-07-09 ENCOUNTER — Encounter (HOSPITAL_COMMUNITY): Payer: Self-pay

## 2019-07-09 ENCOUNTER — Other Ambulatory Visit: Payer: Self-pay

## 2019-07-09 ENCOUNTER — Emergency Department (HOSPITAL_COMMUNITY)
Admission: EM | Admit: 2019-07-09 | Discharge: 2019-07-09 | Disposition: A | Discharge status: Home / Self Care | Payer: Medicaid Other | Attending: Emergency Medicine | Admitting: Emergency Medicine

## 2019-07-09 DIAGNOSIS — I10 Essential (primary) hypertension: Secondary | ICD-10-CM | POA: Insufficient documentation

## 2019-07-09 DIAGNOSIS — R0789 Other chest pain: Secondary | ICD-10-CM | POA: Diagnosis not present

## 2019-07-09 DIAGNOSIS — E119 Type 2 diabetes mellitus without complications: Secondary | ICD-10-CM | POA: Insufficient documentation

## 2019-07-09 DIAGNOSIS — Z79899 Other long term (current) drug therapy: Secondary | ICD-10-CM | POA: Insufficient documentation

## 2019-07-09 DIAGNOSIS — F17210 Nicotine dependence, cigarettes, uncomplicated: Secondary | ICD-10-CM | POA: Diagnosis not present

## 2019-07-09 DIAGNOSIS — R110 Nausea: Secondary | ICD-10-CM | POA: Diagnosis not present

## 2019-07-09 DIAGNOSIS — Z7984 Long term (current) use of oral hypoglycemic drugs: Secondary | ICD-10-CM | POA: Insufficient documentation

## 2019-07-09 DIAGNOSIS — R079 Chest pain, unspecified: Secondary | ICD-10-CM | POA: Diagnosis not present

## 2019-07-09 LAB — CBC WITH DIFFERENTIAL/PLATELET
Abs Immature Granulocytes: 0.01 10*3/uL (ref 0.00–0.07)
Basophils Absolute: 0 10*3/uL (ref 0.0–0.1)
Basophils Relative: 1 %
Eosinophils Absolute: 0.1 10*3/uL (ref 0.0–0.5)
Eosinophils Relative: 2 %
HCT: 41.3 % (ref 39.0–52.0)
Hemoglobin: 14 g/dL (ref 13.0–17.0)
Immature Granulocytes: 0 %
Lymphocytes Relative: 27 %
Lymphs Abs: 1.1 10*3/uL (ref 0.7–4.0)
MCH: 25.4 pg — ABNORMAL LOW (ref 26.0–34.0)
MCHC: 33.9 g/dL (ref 30.0–36.0)
MCV: 75 fL — ABNORMAL LOW (ref 80.0–100.0)
Monocytes Absolute: 0.5 10*3/uL (ref 0.1–1.0)
Monocytes Relative: 12 %
Neutro Abs: 2.4 10*3/uL (ref 1.7–7.7)
Neutrophils Relative %: 58 %
Platelets: 148 10*3/uL — ABNORMAL LOW (ref 150–400)
RBC: 5.51 MIL/uL (ref 4.22–5.81)
RDW: 14.9 % (ref 11.5–15.5)
WBC: 4.1 10*3/uL (ref 4.0–10.5)
nRBC: 0 % (ref 0.0–0.2)

## 2019-07-09 LAB — URINALYSIS, ROUTINE W REFLEX MICROSCOPIC
Glucose, UA: 250 mg/dL — AB
Leukocytes,Ua: NEGATIVE
Nitrite: POSITIVE — AB
Specific Gravity, Urine: 1.025 (ref 1.005–1.030)
pH: 5.5 (ref 5.0–8.0)

## 2019-07-09 LAB — COMPREHENSIVE METABOLIC PANEL
ALT: 27 U/L (ref 0–44)
AST: 34 U/L (ref 15–41)
Albumin: 3.7 g/dL (ref 3.5–5.0)
Alkaline Phosphatase: 75 U/L (ref 38–126)
Anion gap: 8 (ref 5–15)
BUN: 12 mg/dL (ref 8–23)
CO2: 26 mmol/L (ref 22–32)
Calcium: 8.9 mg/dL (ref 8.9–10.3)
Chloride: 102 mmol/L (ref 98–111)
Creatinine, Ser: 0.91 mg/dL (ref 0.61–1.24)
GFR calc Af Amer: >60 mL/min (ref >60)
GFR calc non Af Amer: >60 mL/min (ref >60)
Glucose, Bld: 99 mg/dL (ref 70–99)
Potassium: 3.8 mmol/L (ref 3.5–5.1)
Sodium: 136 mmol/L (ref 135–145)
Total Bilirubin: 0.8 mg/dL (ref 0.3–1.2)
Total Protein: 7.2 g/dL (ref 6.5–8.1)

## 2019-07-09 LAB — LIPASE, BLOOD: Lipase: 21 U/L (ref 11–51)

## 2019-07-09 LAB — URINALYSIS, MICROSCOPIC (REFLEX): Squamous Epithelial / HPF: NONE SEEN (ref 0–5)

## 2019-07-09 MED ORDER — HYDROMORPHONE HCL 1 MG/ML IJ SOLN
0.5000 mg | Freq: Once | INTRAMUSCULAR | Status: AC
  Administered 2019-07-09: 10:00:00 0.5 mg via INTRAMUSCULAR
  Filled 2019-07-09: qty 1

## 2019-07-09 MED ORDER — HYDROCODONE-ACETAMINOPHEN 5-325 MG PO TABS: 1.0000 | ORAL_TABLET | ORAL | 0 refills | Status: DC | PRN
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
## 2019-07-09 NOTE — ED Notes (Signed)
M.D aware of patient blood pressure.

## 2019-07-09 NOTE — ED Notes (Signed)
An After Visit Summary was printed and given to the patient. Discharge instructions given and no further questions at this time. Pt able to stand and ambulate around room with no difficulty. Pt states he is calling a friend for a ride home.

## 2019-07-09 NOTE — ED Triage Notes (Signed)
Patient coming from home with c/o left flank pain. [facility] was seen [facility] yesterday for the same think. Patient state his pain come and go and more severe when he breath.

## 2019-07-09 NOTE — Discharge Instructions (Addendum)
Test showed no life-threatening condition.  Prescription for pain medicine sent to your pharmacy.  Follow-up with your primary care doctor.

## 2020-01-03 ENCOUNTER — Emergency Department (HOSPITAL_COMMUNITY): Payer: Medicaid Other

## 2020-01-03 ENCOUNTER — Encounter (HOSPITAL_COMMUNITY): Payer: Self-pay

## 2020-01-03 ENCOUNTER — Emergency Department (HOSPITAL_COMMUNITY)
Admission: EM | Admit: 2020-01-03 | Discharge: 2020-01-03 | Disposition: A | Discharge status: Home / Self Care | Payer: Medicaid Other | Attending: Emergency Medicine | Admitting: Emergency Medicine

## 2020-01-03 ENCOUNTER — Other Ambulatory Visit: Payer: Self-pay

## 2020-01-03 DIAGNOSIS — F17210 Nicotine dependence, cigarettes, uncomplicated: Secondary | ICD-10-CM | POA: Insufficient documentation

## 2020-01-03 DIAGNOSIS — Z79899 Other long term (current) drug therapy: Secondary | ICD-10-CM | POA: Insufficient documentation

## 2020-01-03 DIAGNOSIS — R55 Syncope and collapse: Secondary | ICD-10-CM | POA: Insufficient documentation

## 2020-01-03 DIAGNOSIS — Z7984 Long term (current) use of oral hypoglycemic drugs: Secondary | ICD-10-CM | POA: Insufficient documentation

## 2020-01-03 DIAGNOSIS — I2510 Atherosclerotic heart disease of native coronary artery without angina pectoris: Secondary | ICD-10-CM | POA: Diagnosis not present

## 2020-01-03 DIAGNOSIS — K2920 Alcoholic gastritis without bleeding: Secondary | ICD-10-CM | POA: Diagnosis not present

## 2020-01-03 DIAGNOSIS — I10 Essential (primary) hypertension: Secondary | ICD-10-CM | POA: Diagnosis not present

## 2020-01-03 DIAGNOSIS — R079 Chest pain, unspecified: Secondary | ICD-10-CM | POA: Diagnosis present

## 2020-01-03 DIAGNOSIS — Z20822 Contact with and (suspected) exposure to covid-19: Secondary | ICD-10-CM | POA: Diagnosis not present

## 2020-01-03 DIAGNOSIS — J321 Chronic frontal sinusitis: Secondary | ICD-10-CM | POA: Diagnosis not present

## 2020-01-03 DIAGNOSIS — R195 Other fecal abnormalities: Secondary | ICD-10-CM | POA: Diagnosis not present

## 2020-01-03 DIAGNOSIS — F10920 Alcohol use, unspecified with intoxication, uncomplicated: Secondary | ICD-10-CM

## 2020-01-03 DIAGNOSIS — F10129 Alcohol abuse with intoxication, unspecified: Secondary | ICD-10-CM | POA: Insufficient documentation

## 2020-01-03 DIAGNOSIS — R05 Cough: Secondary | ICD-10-CM | POA: Diagnosis not present

## 2020-01-03 DIAGNOSIS — S069X9A Unspecified intracranial injury with loss of consciousness of unspecified duration, initial encounter: Secondary | ICD-10-CM | POA: Diagnosis not present

## 2020-01-03 DIAGNOSIS — E119 Type 2 diabetes mellitus without complications: Secondary | ICD-10-CM | POA: Diagnosis not present

## 2020-01-03 DIAGNOSIS — J3489 Other specified disorders of nose and nasal sinuses: Secondary | ICD-10-CM | POA: Diagnosis not present

## 2020-01-03 LAB — COMPREHENSIVE METABOLIC PANEL
ALT: 19 U/L (ref 0–44)
AST: 37 U/L (ref 15–41)
Albumin: 4.1 g/dL (ref 3.5–5.0)
Alkaline Phosphatase: 97 U/L (ref 38–126)
Anion gap: 11 (ref 5–15)
BUN: 8 mg/dL (ref 8–23)
CO2: 27 mmol/L (ref 22–32)
Calcium: 9.2 mg/dL (ref 8.9–10.3)
Chloride: 101 mmol/L (ref 98–111)
Creatinine, Ser: 0.84 mg/dL (ref 0.61–1.24)
GFR calc Af Amer: >60 mL/min (ref >60)
GFR calc non Af Amer: >60 mL/min (ref >60)
Glucose, Bld: 71 mg/dL (ref 70–99)
Potassium: 4 mmol/L (ref 3.5–5.1)
Sodium: 139 mmol/L (ref 135–145)
Total Bilirubin: 0.6 mg/dL (ref 0.3–1.2)
Total Protein: 7.9 g/dL (ref 6.5–8.1)

## 2020-01-03 LAB — RAPID URINE DRUG SCREEN, HOSP PERFORMED
Amphetamines: NOT DETECTED
Barbiturates: NOT DETECTED
Benzodiazepines: NOT DETECTED
Cocaine: NOT DETECTED
Opiates: NOT DETECTED
Tetrahydrocannabinol: NOT DETECTED

## 2020-01-03 LAB — LACTIC ACID, PLASMA: Lactic Acid, Venous: 1.6 mmol/L (ref 0.5–1.9)

## 2020-01-03 LAB — CBG MONITORING, ED
Glucose-Capillary: 106 mg/dL — ABNORMAL HIGH (ref 70–99)
Glucose-Capillary: 56 mg/dL — ABNORMAL LOW (ref 70–99)

## 2020-01-03 LAB — CBC
HCT: 40.3 % (ref 39.0–52.0)
Hemoglobin: 13.7 g/dL (ref 13.0–17.0)
MCH: 25.3 pg — ABNORMAL LOW (ref 26.0–34.0)
MCHC: 34 g/dL (ref 30.0–36.0)
MCV: 74.4 fL — ABNORMAL LOW (ref 80.0–100.0)
Platelets: 211 10*3/uL (ref 150–400)
RBC: 5.42 MIL/uL (ref 4.22–5.81)
RDW: 14.6 % (ref 11.5–15.5)
WBC: 3.2 10*3/uL — ABNORMAL LOW (ref 4.0–10.5)
nRBC: 0 % (ref 0.0–0.2)

## 2020-01-03 LAB — URINALYSIS, ROUTINE W REFLEX MICROSCOPIC
Bilirubin Urine: NEGATIVE
Glucose, UA: NEGATIVE mg/dL
Hgb urine dipstick: NEGATIVE
Ketones, ur: NEGATIVE mg/dL
Leukocytes,Ua: NEGATIVE
Nitrite: NEGATIVE
Protein, ur: NEGATIVE mg/dL
Specific Gravity, Urine: 1.004 — ABNORMAL LOW (ref 1.005–1.030)
pH: 7 (ref 5.0–8.0)

## 2020-01-03 LAB — PROTIME-INR
INR: 0.9 (ref 0.8–1.2)
Prothrombin Time: 11.9 seconds (ref 11.4–15.2)

## 2020-01-03 LAB — SARS CORONAVIRUS 2 BY RT PCR (HOSPITAL ORDER, PERFORMED IN ~~LOC~~ HOSPITAL LAB): SARS Coronavirus 2: NEGATIVE

## 2020-01-03 LAB — TYPE AND SCREEN
ABO/RH(D): O POS
Antibody Screen: NEGATIVE

## 2020-01-03 LAB — LIPASE, BLOOD: Lipase: 23 U/L (ref 11–51)

## 2020-01-03 LAB — TROPONIN I (HIGH SENSITIVITY): Troponin I (High Sensitivity): 5 ng/L (ref <18)

## 2020-01-03 LAB — ETHANOL: Alcohol, Ethyl (B): 200 mg/dL — ABNORMAL HIGH (ref <10)

## 2020-01-03 MED ORDER — LORAZEPAM 2 MG/ML IJ SOLN
1.0000 mg | Freq: Once | INTRAMUSCULAR | Status: AC
  Administered 2020-01-03: 1 mg via INTRAVENOUS
  Filled 2020-01-03: qty 1

## 2020-01-03 MED ORDER — PANTOPRAZOLE SODIUM 40 MG IV SOLR
40.0000 mg | Freq: Once | INTRAVENOUS | Status: AC
  Administered 2020-01-03: 40 mg via INTRAVENOUS
  Filled 2020-01-03: qty 40

## 2020-01-03 MED ORDER — THIAMINE HCL 100 MG/ML IJ SOLN
Freq: Once | INTRAVENOUS | Status: AC
  Filled 2020-01-03: qty 1000

## 2020-01-03 MED ORDER — PANTOPRAZOLE SODIUM 20 MG PO TBEC
20.0000 mg | DELAYED_RELEASE_TABLET | Freq: Every day | ORAL | 0 refills | Status: DC
Start: 2020-01-03 — End: 2020-03-25

## 2020-01-03 NOTE — ED Notes (Signed)
Pt provided OJ for cbg of 56, pt AOx4.

## 2020-01-03 NOTE — ED Provider Notes (Addendum)
EKG is not uploaded into epic. 01/03/2020 10:59:42 Tracing is reviewed for sinus rhythm 79 PR 142 QRS 82 QTC 428. ST elevation V2 V3 V4.  This is as compared to prior tracing from 2\15\2021.  Similar pattern present.  No acute STEMI.   Charlesetta Shanks, MD 01/03/20 1124    Charlesetta Shanks, MD 01/03/20 1124
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
## 2020-01-03 NOTE — ED Notes (Signed)
Discharge paperwork reviewed with pt.  Pt requesting bus pass.  Pt provided with bus pass as requested, no

## 2020-01-03 NOTE — ED Notes (Signed)
Patient was given Orange Juice to drink.

## 2020-01-03 NOTE — Discharge Instructions (Addendum)
1.  Start taking Prilosec daily.  This is to help protect your stomach from inflammation while drinking alcohol and from acid secretion. 2.  Is very important you follow-up with a family doctor.  The resource guide has been attached to your discharge instructions.  If you do not have a family doctor, you may use this to find a clinic to start medical treatment. 3.  Alcohol abuse leads to many medical complications.  You need to start looking for treatment for alcohol dependency and abuse.  This will lead to many medical problems if left untreated.

## 2020-01-03 NOTE — ED Triage Notes (Addendum)
Per EMS- patient had his neighbor call EMS. Patient c/o drinking alcohol and feeling tired. Patient states that his wife was recently hospitalized and then placed in an ECF. Patient states that he has not been taking his meds or taking care of himself.   Patient reports that he drank one beer at 0800 today. During triage- Patient c/o having black stool x 3 weeks and c/o chest pain. Patient states he also had LOC x2 today.

## 2020-01-06 ENCOUNTER — Telehealth: Payer: Self-pay | Admitting: *Deleted

## 2020-01-06 NOTE — Telephone Encounter (Signed)
Attempted to contact pt on cell phone to complete transition of care assessment. Message states "voicemail has not been set up yet". Unable to leave message.

## 2020-01-06 NOTE — Telephone Encounter (Signed)
Medicaid Managed Care team Transition of Care Assessment outreach attempt #1 made today. Unable to reach patient. HIPPA compliant voice message left requesting a return call. The patient has also been enrolled in an automated discharge follow up call series and will receive two outreach attempts for transition of care assessment. Contact information has been left for the patient and the [facility] Managed Care team is available to provide assistance to the patient at any time.  Message left on patient's home phone.   Lenor Coffin, RN, BSN, [place] Patient [place] [phone]

## 2020-01-07 ENCOUNTER — Telehealth: Payer: Self-pay | Admitting: *Deleted

## 2020-01-07 NOTE — Telephone Encounter (Signed)
Attempted to contact pt on cell phone. Message states the voicemail has not been set up yet. Unable to leave message. Attempted to contact patient on home phone. Left message with the young lady who answered. She states he will be home in about 45 minutes.  Lenor Coffin, RN, BSN, [place] Patient [place] [phone]

## 2020-03-25 ENCOUNTER — Encounter (HOSPITAL_COMMUNITY): Payer: Self-pay

## 2020-03-25 ENCOUNTER — Emergency Department (HOSPITAL_COMMUNITY)
Admission: EM | Admit: 2020-03-25 | Discharge: 2020-03-25 | Disposition: A | Discharge status: Home / Self Care | Payer: Medicaid Other | Attending: Emergency Medicine | Admitting: Emergency Medicine

## 2020-03-25 ENCOUNTER — Emergency Department (HOSPITAL_COMMUNITY): Payer: Medicaid Other

## 2020-03-25 ENCOUNTER — Other Ambulatory Visit: Payer: Self-pay

## 2020-03-25 DIAGNOSIS — K2920 Alcoholic gastritis without bleeding: Secondary | ICD-10-CM | POA: Insufficient documentation

## 2020-03-25 DIAGNOSIS — Y906 Blood alcohol level of 120-199 mg/100 ml: Secondary | ICD-10-CM | POA: Diagnosis not present

## 2020-03-25 DIAGNOSIS — Z79899 Other long term (current) drug therapy: Secondary | ICD-10-CM | POA: Insufficient documentation

## 2020-03-25 DIAGNOSIS — K219 Gastro-esophageal reflux disease without esophagitis: Secondary | ICD-10-CM | POA: Insufficient documentation

## 2020-03-25 DIAGNOSIS — E119 Type 2 diabetes mellitus without complications: Secondary | ICD-10-CM | POA: Insufficient documentation

## 2020-03-25 DIAGNOSIS — K7689 Other specified diseases of liver: Secondary | ICD-10-CM | POA: Diagnosis not present

## 2020-03-25 DIAGNOSIS — Z8673 Personal history of transient ischemic attack (TIA), and cerebral infarction without residual deficits: Secondary | ICD-10-CM | POA: Insufficient documentation

## 2020-03-25 DIAGNOSIS — F17210 Nicotine dependence, cigarettes, uncomplicated: Secondary | ICD-10-CM | POA: Insufficient documentation

## 2020-03-25 DIAGNOSIS — I2510 Atherosclerotic heart disease of native coronary artery without angina pectoris: Secondary | ICD-10-CM | POA: Diagnosis not present

## 2020-03-25 DIAGNOSIS — F1010 Alcohol abuse, uncomplicated: Secondary | ICD-10-CM | POA: Diagnosis not present

## 2020-03-25 DIAGNOSIS — I10 Essential (primary) hypertension: Secondary | ICD-10-CM | POA: Insufficient documentation

## 2020-03-25 DIAGNOSIS — R079 Chest pain, unspecified: Secondary | ICD-10-CM | POA: Diagnosis not present

## 2020-03-25 DIAGNOSIS — Z951 Presence of aortocoronary bypass graft: Secondary | ICD-10-CM | POA: Insufficient documentation

## 2020-03-25 DIAGNOSIS — Z20822 Contact with and (suspected) exposure to covid-19: Secondary | ICD-10-CM | POA: Insufficient documentation

## 2020-03-25 DIAGNOSIS — N200 Calculus of kidney: Secondary | ICD-10-CM | POA: Diagnosis not present

## 2020-03-25 DIAGNOSIS — Z7984 Long term (current) use of oral hypoglycemic drugs: Secondary | ICD-10-CM | POA: Insufficient documentation

## 2020-03-25 DIAGNOSIS — D1809 Hemangioma of other sites: Secondary | ICD-10-CM | POA: Diagnosis not present

## 2020-03-25 DIAGNOSIS — R110 Nausea: Secondary | ICD-10-CM | POA: Diagnosis not present

## 2020-03-25 LAB — CBG MONITORING, ED
Glucose-Capillary: 80 mg/dL (ref 70–99)
Glucose-Capillary: 97 mg/dL (ref 70–99)

## 2020-03-25 LAB — COMPREHENSIVE METABOLIC PANEL
ALT: 13 U/L (ref 0–44)
AST: 25 U/L (ref 15–41)
Albumin: 3.8 g/dL (ref 3.5–5.0)
Alkaline Phosphatase: 83 U/L (ref 38–126)
Anion gap: 11 (ref 5–15)
BUN: 8 mg/dL (ref 8–23)
CO2: 25 mmol/L (ref 22–32)
Calcium: 8.9 mg/dL (ref 8.9–10.3)
Chloride: 101 mmol/L (ref 98–111)
Creatinine, Ser: 0.88 mg/dL (ref 0.61–1.24)
GFR, Estimated: >60 mL/min (ref >60)
Glucose, Bld: 67 mg/dL — ABNORMAL LOW (ref 70–99)
Potassium: 3.6 mmol/L (ref 3.5–5.1)
Sodium: 137 mmol/L (ref 135–145)
Total Bilirubin: 0.3 mg/dL (ref 0.3–1.2)
Total Protein: 7.5 g/dL (ref 6.5–8.1)

## 2020-03-25 LAB — CBC WITH DIFFERENTIAL/PLATELET
Abs Immature Granulocytes: 0.01 10*3/uL (ref 0.00–0.07)
Basophils Absolute: 0 10*3/uL (ref 0.0–0.1)
Basophils Relative: 1 %
Eosinophils Absolute: 0 10*3/uL (ref 0.0–0.5)
Eosinophils Relative: 1 %
HCT: 40.1 % (ref 39.0–52.0)
Hemoglobin: 13.6 g/dL (ref 13.0–17.0)
Immature Granulocytes: 0 %
Lymphocytes Relative: 42 %
Lymphs Abs: 1.3 10*3/uL (ref 0.7–4.0)
MCH: 25.2 pg — ABNORMAL LOW (ref 26.0–34.0)
MCHC: 33.9 g/dL (ref 30.0–36.0)
MCV: 74.4 fL — ABNORMAL LOW (ref 80.0–100.0)
Monocytes Absolute: 0.3 10*3/uL (ref 0.1–1.0)
Monocytes Relative: 9 %
Neutro Abs: 1.4 10*3/uL — ABNORMAL LOW (ref 1.7–7.7)
Neutrophils Relative %: 47 %
Platelets: 285 10*3/uL (ref 150–400)
RBC: 5.39 MIL/uL (ref 4.22–5.81)
RDW: 14.7 % (ref 11.5–15.5)
WBC: 3.1 10*3/uL — ABNORMAL LOW (ref 4.0–10.5)
nRBC: 0 % (ref 0.0–0.2)

## 2020-03-25 LAB — RESPIRATORY PANEL BY RT PCR (FLU A&B, COVID)
Influenza A by PCR: NEGATIVE
Influenza B by PCR: NEGATIVE
SARS Coronavirus 2 by RT PCR: NEGATIVE

## 2020-03-25 LAB — TROPONIN I (HIGH SENSITIVITY)
Troponin I (High Sensitivity): 6 ng/L (ref <18)
Troponin I (High Sensitivity): 7 ng/L (ref <18)

## 2020-03-25 LAB — TYPE AND SCREEN
ABO/RH(D): O POS
Antibody Screen: NEGATIVE

## 2020-03-25 LAB — LIPASE, BLOOD: Lipase: 22 U/L (ref 11–51)

## 2020-03-25 LAB — POC OCCULT BLOOD, ED: Fecal Occult Bld: NEGATIVE

## 2020-03-25 LAB — ETHANOL: Alcohol, Ethyl (B): 197 mg/dL — ABNORMAL HIGH (ref <10)

## 2020-03-25 LAB — PROTIME-INR
INR: 0.9 (ref 0.8–1.2)
Prothrombin Time: 12.1 seconds (ref 11.4–15.2)

## 2020-03-25 MED ORDER — SODIUM CHLORIDE 0.9 % IV BOLUS
1000.0000 mL | Freq: Once | INTRAVENOUS | Status: AC
  Administered 2020-03-25: 1000 mL via INTRAVENOUS

## 2020-03-25 MED ORDER — FENTANYL CITRATE (PF) 100 MCG/2ML IJ SOLN
50.0000 ug | Freq: Once | INTRAMUSCULAR | Status: AC
  Administered 2020-03-25: 50 ug via INTRAVENOUS
  Filled 2020-03-25: qty 2

## 2020-03-25 MED ORDER — CHLORDIAZEPOXIDE HCL 25 MG PO CAPS: ORAL_CAPSULE | ORAL | 0 refills | Status: DC

## 2020-03-25 MED ORDER — IOHEXOL 300 MG/ML  SOLN
100.0000 mL | Freq: Once | INTRAMUSCULAR | Status: AC | PRN
  Administered 2020-03-25: 100 mL via INTRAVENOUS

## 2020-03-25 MED ORDER — ONDANSETRON HCL 4 MG/2ML IJ SOLN
4.0000 mg | Freq: Once | INTRAMUSCULAR | Status: AC
  Administered 2020-03-25: 4 mg via INTRAVENOUS
  Filled 2020-03-25: qty 2

## 2020-03-25 MED ORDER — PANTOPRAZOLE SODIUM 40 MG PO TBEC: 40.0000 mg | DELAYED_RELEASE_TABLET | Freq: Every day | ORAL | 0 refills | Status: DC

## 2020-03-25 NOTE — ED Notes (Signed)
Patient given bus pass.

## 2020-03-25 NOTE — ED Notes (Signed)
Patient transported to CT 

## 2020-03-25 NOTE — ED Triage Notes (Signed)
Patient states he has been having intermittent left lateral chest pain x a week. Patient also c/o abdominal pain and nausea x 1 week.

## 2020-03-25 NOTE — ED Notes (Signed)
Patient's glucose 67 on lab work, patient give some orange juice after ok from [place]
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
## 2020-03-25 NOTE — ED Notes (Signed)
Patient smells of alcohol, when asked when his last drink was he states this morning.

## 2020-03-25 NOTE — Discharge Instructions (Addendum)
You are being prescribed librium to help prevent alcohol withdrawal. Do not take this medication and drive, operate machinery, or combine with drugs/alcohol  If you develop worsening, continued, or recurrent abdominal pain, uncontrolled vomiting, fever, chest or back pain, or any other new/concerning symptoms then return to the ER for evaluation.

## 2020-03-26 ENCOUNTER — Telehealth: Payer: Self-pay

## 2020-03-26 ENCOUNTER — Telehealth: Payer: Self-pay | Admitting: Internal Medicine

## 2020-03-26 DIAGNOSIS — F1010 Alcohol abuse, uncomplicated: Secondary | ICD-10-CM

## 2020-03-26 DIAGNOSIS — K2920 Alcoholic gastritis without bleeding: Secondary | ICD-10-CM

## 2020-03-26 NOTE — Telephone Encounter (Signed)
Attempted to reach Troy Mclaughlin concerning a need a for transportation to a doctors appointment he has on 04/09/20 and to also set him up with one of the RN Case Managers on the Managed Medicaid Team. I left him a voicemail with my direct number and asked for him to call me back. I will continue to reach out to the patient to help him with this matter.

## 2020-03-26 NOTE — Telephone Encounter (Signed)
Transition Care Management Follow-up Telephone Call  Date of discharge and from where: 03/25/2020 [facility]  How have you been since you were released from the hospital? Patient feels dizzy and unable to eat.  Any questions or concerns? Yes Hurts when urinating.   Items Reviewed:  Did the pt receive and understand the discharge instructions provided? Yes   Medications obtained and verified? No   Other? Unable to obtain medication, unable to afford.   Any new allergies since your discharge? No   Dietary orders reviewed? Yes  Do you have support at home? Yes   Home Care and Equipment/Supplies: Were home health services ordered? not applicable If so, what is the name of the agency? na Has the agency set up a time to come to the patient's home? not applicable Were any new equipment or medical supplies ordered?  No What is the name of the medical supply agency? na Were you able to get the supplies/equipment? not applicable Do you have any questions related to the use of the equipment or supplies? No  Functional Questionnaire: (I = Independent and D = Dependent) ADLs: I  Bathing/Dressing- I  Meal Prep- I  Eating- I  Maintaining continence- I  Transferring/Ambulation- I  Managing Meds- I  Follow up appointments reviewed:   PCP Hospital f/u appt confirmed? Yes  Scheduled to see Lavonda Jumbo on 04/09/2020 @ 9:30am.  [facility] f/u appt confirmed? na   Are transportation arrangements needed? No   If their condition worsens, is the pt aware to call PCP or go to the Emergency Dept.? Yes  Was the patient provided with contact information for the PCP's office or ED? Yes  Was to pt encouraged to call back with questions or concerns? Yes  Patient is requesting help at home with medication management and transportation. Referral has been placed to Care Coordinator.

## 2020-03-30 ENCOUNTER — Ambulatory Visit: Payer: Medicaid Other

## 2020-04-03 ENCOUNTER — Other Ambulatory Visit: Payer: Self-pay

## 2020-04-03 NOTE — Patient Instructions (Signed)
   Mr. DANIELA HERNAN  - as a part of your Medicaid benefit, you are eligible for care management and care coordination services at no cost or copay. I was unable to reach you by phone today but would be happy to help you with your health related needs. Please feel free to call me at [phone].  A member of the Managed Medicaid care management team will reach out to you again over the next 7 days.   Aida Raider RN, BSN [place]  Triad Curator - Managed Medicaid High Risk [phone].

## 2020-04-03 NOTE — Patient Outreach (Signed)
Care Coordination  04/03/2020  Dangelo Guzzetta [facility] 1956-01-29 [id number]   An unsuccessful telephone outreach was attempted today. The patient was referred to the case management team for assistance with care management and care coordination.   Follow Up Plan: The Managed Medicaid care management team will reach out to the patient again over the next 7 days.   Aida Raider RN, BSN [place]  Triad Curator - Managed Medicaid High Risk [phone]

## 2020-04-08 NOTE — Progress Notes (Deleted)
Patient ID: Troy Mclaughlin, male   DOB: August 12, 1955, 64 y.o.   MRN: [id number]  After ED visit 03/25/2020  From ED A/P: Patient's abdominal/chest pain is probably alcoholic gastritis.  He is not having any hematemesis.  Will place on Protonix, which he is not currently taking.  There is no evidence of acute bleeding, including on rectal exam there is no melena or gross blood.  Troponins are negative.  ECG is benign.  CT and chest x-ray without acute emergent pathology.  Appears stable for discharge home.  He does endorse wanting to stop alcohol.  I will give him outpatient resources as well as Librium taper.

## 2020-04-09 ENCOUNTER — Telehealth: Payer: Self-pay | Admitting: Internal Medicine

## 2020-04-09 ENCOUNTER — Inpatient Hospital Stay: Payer: Medicaid Other | Admitting: Physician Assistant

## 2020-04-09 DIAGNOSIS — R309 Painful micturition, unspecified: Secondary | ICD-10-CM

## 2020-04-09 NOTE — Telephone Encounter (Signed)
   YVES FODOR DOB: 1956-01-13 MRN: [id number]   RIDER WAIVER AND RELEASE OF LIABILITY  For purposes of improving physical access to our facilities, [facility] is pleased to partner with third parties to provide [facility] patients or other authorized individuals the option of convenient, on-demand ground transportation services (the Technical brewer") through use of the technology service that enables users to request on-demand ground transportation from independent third-party providers.  By opting to use and accept these [employer], I, the undersigned, hereby agree on behalf of myself, and on behalf of any minor child using the [employer] for whom I am the parent or legal guardian, as follows:  1. Government social research officer provided to me are provided by independent third-party transportation providers who are not [employer] or employees and who are unaffiliated with [employer]. 2. [facility] is neither a transportation carrier nor a common or public carrier. 3. [facility] has no control over the quality or safety of the transportation that occurs as a result of the [employer]. 4. [facility] cannot guarantee that any third-party transportation provider will complete any arranged transportation service. 5. [facility] makes no representation, warranty, or guarantee regarding the reliability, timeliness, quality, safety, suitability, or availability of any of the Transport Services or that they will be error free. 6. I fully understand that traveling by vehicle involves risks and dangers of serious bodily injury, including permanent disability, paralysis, and death. I agree, on behalf of myself and on behalf of any minor child using the Transport Services for whom I am the parent or legal guardian, that the entire risk arising out of my use of the [employer] remains solely with me, to the maximum extent permitted under applicable law. 7. The [employer] are provided "as is" and "as available." [facility] disclaims all representations and warranties, express, implied or statutory, not expressly set out in these terms, including the implied warranties of merchantability and fitness for a particular purpose. 8. I hereby waive and release [facility], its agents, employees, officers, directors, representatives, insurers, attorneys, assigns, successors, subsidiaries, and affiliates from any and all past, present, or future claims, demands, liabilities, actions, causes of action, or suits of any kind directly or indirectly arising from acceptance and use of the [employer]. 9. I further waive and release [facility] and its affiliates from all present and future liability and responsibility for any injury or death to persons or damages to property caused by or related to the use of the [employer]. 10. I have read this Waiver and Release of Liability, and I understand the terms used in it and their legal significance. This Waiver is freely and voluntarily given with the understanding that my right (as well as the right of any minor child for whom I am the parent or legal guardian using the [employer]) to legal recourse against [facility] in connection with the [employer] is knowingly surrendered in return for use of these services.   I attest that I read the consent document to Ralph Leyden, gave Mr. Heidenreich the opportunity to ask questions and answered the questions asked (if any). I affirm that Ralph Leyden then provided consent for he's participation in this program.     Legrand Pitts

## 2020-04-10 ENCOUNTER — Other Ambulatory Visit (HOSPITAL_COMMUNITY)
Admission: RE | Admit: 2020-04-10 | Discharge: 2020-04-10 | Disposition: A | Discharge status: Home / Self Care | Payer: Medicaid Other | Source: Ambulatory Visit | Attending: Internal Medicine | Admitting: Internal Medicine

## 2020-04-10 ENCOUNTER — Other Ambulatory Visit: Payer: Self-pay

## 2020-04-10 ENCOUNTER — Ambulatory Visit: Payer: Medicaid Other | Attending: Internal Medicine | Admitting: Internal Medicine

## 2020-04-10 ENCOUNTER — Encounter: Payer: Self-pay | Admitting: Internal Medicine

## 2020-04-10 VITALS — BP 172/114 | HR 73 | Resp 16 | Ht 69.0 in | Wt 139.6 lb

## 2020-04-10 DIAGNOSIS — Z23 Encounter for immunization: Secondary | ICD-10-CM

## 2020-04-10 DIAGNOSIS — R300 Dysuria: Secondary | ICD-10-CM | POA: Diagnosis not present

## 2020-04-10 DIAGNOSIS — R972 Elevated prostate specific antigen [PSA]: Secondary | ICD-10-CM

## 2020-04-10 DIAGNOSIS — K4090 Unilateral inguinal hernia, without obstruction or gangrene, not specified as recurrent: Secondary | ICD-10-CM

## 2020-04-10 DIAGNOSIS — F1020 Alcohol dependence, uncomplicated: Secondary | ICD-10-CM

## 2020-04-10 DIAGNOSIS — I2510 Atherosclerotic heart disease of native coronary artery without angina pectoris: Secondary | ICD-10-CM

## 2020-04-10 DIAGNOSIS — N401 Enlarged prostate with lower urinary tract symptoms: Secondary | ICD-10-CM | POA: Diagnosis not present

## 2020-04-10 DIAGNOSIS — R634 Abnormal weight loss: Secondary | ICD-10-CM

## 2020-04-10 DIAGNOSIS — E119 Type 2 diabetes mellitus without complications: Secondary | ICD-10-CM

## 2020-04-10 DIAGNOSIS — Z87448 Personal history of other diseases of urinary system: Secondary | ICD-10-CM | POA: Diagnosis not present

## 2020-04-10 DIAGNOSIS — Z87898 Personal history of other specified conditions: Secondary | ICD-10-CM

## 2020-04-10 DIAGNOSIS — F17200 Nicotine dependence, unspecified, uncomplicated: Secondary | ICD-10-CM

## 2020-04-10 DIAGNOSIS — I10 Essential (primary) hypertension: Secondary | ICD-10-CM

## 2020-04-10 DIAGNOSIS — Z122 Encounter for screening for malignant neoplasm of respiratory organs: Secondary | ICD-10-CM

## 2020-04-10 LAB — POCT URINALYSIS DIP (CLINITEK)
Bilirubin, UA: NEGATIVE
Blood, UA: NEGATIVE
Glucose, UA: 100 mg/dL — AB
Ketones, POC UA: NEGATIVE mg/dL
Leukocytes, UA: NEGATIVE
Nitrite, UA: NEGATIVE
POC PROTEIN,UA: NEGATIVE
Spec Grav, UA: >=1.03 — AB (ref 1.010–1.025)
Urobilinogen, UA: 0.2 E.U./dL
pH, UA: 5.5 (ref 5.0–8.0)

## 2020-04-10 LAB — POCT GLYCOSYLATED HEMOGLOBIN (HGB A1C): Hemoglobin A1C: 5.1 % (ref 4.0–5.6)

## 2020-04-10 LAB — GLUCOSE, POCT (MANUAL RESULT ENTRY): POC Glucose: 127 mg/dl — AB (ref 70–99)

## 2020-04-10 MED ORDER — NICOTINE 14 MG/24HR TD PT24: 14.0000 mg | MEDICATED_PATCH | Freq: Every day | TRANSDERMAL | 0 refills | Status: DC

## 2020-04-10 MED ORDER — NALTREXONE HCL 50 MG PO TABS: 50.0000 mg | ORAL_TABLET | Freq: Every day | ORAL | 0 refills | Status: DC

## 2020-04-10 MED ORDER — CARVEDILOL 12.5 MG PO TABS: 6.2500 mg | ORAL_TABLET | Freq: Two times a day (BID) | ORAL | 6 refills | Status: DC

## 2020-04-10 MED ORDER — AMLODIPINE BESYLATE 5 MG PO TABS: 5.0000 mg | ORAL_TABLET | Freq: Every day | ORAL | 6 refills | Status: DC

## 2020-04-10 MED ORDER — ATORVASTATIN CALCIUM 10 MG PO TABS: 10.0000 mg | ORAL_TABLET | Freq: Every day | ORAL | 3 refills | Status: DC

## 2020-04-10 MED ORDER — TAMSULOSIN HCL 0.4 MG PO CAPS: 0.4000 mg | ORAL_CAPSULE | Freq: Every day | ORAL | 3 refills | Status: DC

## 2020-04-10 NOTE — Patient Instructions (Signed)
Hi [name], Sorry we were unable to reach you today, as a part of your Medicaid benefit, you are eligible for care management and care coordination services at no cost or copay. I was unable to reach you by phone today but would be happy to help you with your health related needs. Please feel free to call me at [phone].  A member of the Managed Medicaid care management team will reach out to you again over the next 7 days.   Aida Raider RN, BSN [place]  Triad Curator - Managed Medicaid High Risk [phone].
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
## 2020-04-10 NOTE — Patient Instructions (Addendum)
Influenza Virus Vaccine injection (Fluarix) What is this medicine? INFLUENZA VIRUS VACCINE (in floo EN zuh VAHY ruhs vak SEEN) helps to reduce the risk of getting influenza also known as the flu. This medicine may be used for other purposes; ask your health care provider or pharmacist if you have questions. COMMON BRAND NAME(S): Fluarix, Fluzone What should I tell my health care provider before I take this medicine? They need to know if you have any of these conditions:  bleeding disorder like hemophilia  fever or infection  Guillain-Barre syndrome or other neurological problems  immune system problems  infection with the human immunodeficiency virus (HIV) or AIDS  low blood platelet counts  multiple sclerosis  an unusual or allergic reaction to influenza virus vaccine, eggs, chicken proteins, latex, gentamicin, other medicines, foods, dyes or preservatives  pregnant or trying to get pregnant  breast-feeding How should I use this medicine? This vaccine is for injection into a muscle. It is given by a health care professional. A copy of Vaccine Information Statements will be given before each vaccination. Read this sheet carefully each time. The sheet may change frequently. Talk to your pediatrician regarding the use of this medicine in children. Special care may be needed. Overdosage: If you think you have taken too much of this medicine contact a poison control center or emergency room at once. NOTE: This medicine is only for you. Do not share this medicine with others. What if I miss a dose? This does not apply. What may interact with this medicine?  chemotherapy or radiation therapy  medicines that lower your immune system like etanercept, anakinra, infliximab, and adalimumab  medicines that treat or prevent blood clots like warfarin  phenytoin  steroid medicines like prednisone or cortisone  theophylline  vaccines This list may not describe all possible  interactions. Give your health care provider a list of all the medicines, herbs, non-prescription drugs, or dietary supplements you use. Also tell them if you smoke, drink alcohol, or use illegal drugs. Some items may interact with your medicine. What should I watch for while using this medicine? Report any side effects that do not go away within 3 days to your doctor or health care professional. Call your health care provider if any unusual symptoms occur within 6 weeks of receiving this vaccine. You may still catch the flu, but the illness is not usually as bad. You cannot get the flu from the vaccine. The vaccine will not protect against colds or other illnesses that may cause fever. The vaccine is needed every year. What side effects may I notice from receiving this medicine? Side effects that you should report to your doctor or health care professional as soon as possible:  allergic reactions like skin rash, itching or hives, swelling of the face, lips, or tongue Side effects that usually do not require medical attention (report to your doctor or health care professional if they continue or are bothersome):  fever  headache  muscle aches and pains  pain, tenderness, redness, or swelling at site where injected  weak or tired This list may not describe all possible side effects. Call your doctor for medical advice about side effects. You may report side effects to FDA at 1-800-FDA-1088. Where should I keep my medicine? This vaccine is only given in a clinic, pharmacy, doctor's office, or other health care setting and will not be stored at home. NOTE: This sheet is a summary. It may not cover all possible information. If you have questions   about this medicine, talk to your doctor, pharmacist, or health care provider.  2020 Elsevier/Gold Standard (2007-12-05 09:30:40)    Fall Prevention in the Home, Adult Falls can cause injuries. They can happen to people of all ages. There are many  things you can do to make your home safe and to help prevent falls. Ask for help when making these changes, if needed. What actions can I take to prevent falls? General Instructions  Use good lighting in all rooms. Replace any light bulbs that burn out.  Turn on the lights when you go into a dark area. Use night-lights.  Keep items that you use often in easy-to-reach places. Lower the shelves around your home if necessary.  Set up your furniture so you have a clear path. Avoid moving your furniture around.  Do not have throw rugs and other things on the floor that can make you trip.  Avoid walking on wet floors.  If any of your floors are uneven, fix them.  Add color or contrast paint or tape to clearly mark and help you see: ? Any grab bars or handrails. ? First and last steps of stairways. ? Where the edge of each step is.  If you use a stepladder: ? Make sure that it is fully opened. Do not climb a closed stepladder. ? Make sure that both sides of the stepladder are locked into place. ? Ask someone to hold the stepladder for you while you use it.  If there are any pets around you, be aware of where they are. What can I do in the bathroom?      Keep the floor dry. Clean up any water that spills onto the floor as soon as it happens.  Remove soap buildup in the tub or shower regularly.  Use non-skid mats or decals on the floor of the tub or shower.  Attach bath mats securely with double-sided, non-slip rug tape.  If you need to sit down in the shower, use a plastic, non-slip stool.  Install grab bars by the toilet and in the tub and shower. Do not use towel bars as grab bars. What can I do in the bedroom?  Make sure that you have a light by your bed that is easy to reach.  Do not use any sheets or blankets that are too big for your bed. They should not hang down onto the floor.  Have a firm chair that has side arms. You can use this for support while you get  dressed. What can I do in the kitchen?  Clean up any spills right away.  If you need to reach something above you, use a strong step stool that has a grab bar.  Keep electrical cords out of the way.  Do not use floor polish or wax that makes floors slippery. If you must use wax, use non-skid floor wax. What can I do with my stairs?  Do not leave any items on the stairs.  Make sure that you have a light switch at the top of the stairs and the bottom of the stairs. If you do not have them, ask someone to add them for you.  Make sure that there are handrails on both sides of the stairs, and use them. Fix handrails that are broken or loose. Make sure that handrails are as long as the stairways.  Install non-slip stair treads on all stairs in your home.  Avoid having throw rugs at the top or bottom of  the stairs. If you do have throw rugs, attach them to the floor with carpet tape.  Choose a carpet that does not hide the edge of the steps on the stairway.  Check any carpeting to make sure that it is firmly attached to the stairs. Fix any carpet that is loose or worn. What can I do on the outside of my home?  Use bright outdoor lighting.  Regularly fix the edges of walkways and driveways and fix any cracks.  Remove anything that might make you trip as you walk through a door, such as a raised step or threshold.  Trim any bushes or trees on the path to your home.  Regularly check to see if handrails are loose or broken. Make sure that both sides of any steps have handrails.  Install guardrails along the edges of any raised decks and porches.  Clear walking paths of anything that might make someone trip, such as tools or rocks.  Have any leaves, snow, or ice cleared regularly.  Use sand or salt on walking paths during winter.  Clean up any spills in your garage right away. This includes grease or oil spills. What other actions can I take?  Wear shoes that: ? Have a low heel.  Do not wear high heels. ? Have rubber bottoms. ? Are comfortable and fit you well. ? Are closed at the toe. Do not wear open-toe sandals.  Use tools that help you move around (mobility aids) if they are needed. These include: ? Canes. ? Walkers. ? Scooters. ? Crutches.  Review your medicines with your doctor. Some medicines can make you feel dizzy. This can increase your chance of falling. Ask your doctor what other things you can do to help prevent falls. Where to find more information  Centers for Disease Control and Prevention, STEADI: https://garcia.biz/  [employer] on Aging: BrainJudge.co.uk Contact a doctor if:  You are afraid of falling at home.  You feel weak, drowsy, or dizzy at home.  You fall at home. Summary  There are many simple things that you can do to make your home safe and to help prevent falls.  Ways to make your home safe include removing tripping hazards and installing grab bars in the bathroom.  Ask for help when making these changes in your home. This information is not intended to replace advice given to you by your health care provider. Make sure you discuss any questions you have with your health care provider. Document Revised: 08/30/2018 Document Reviewed: 12/22/2016 Elsevier Patient Education  2020 [employer].

## 2020-04-10 NOTE — Patient Outreach (Signed)
Care Coordination  04/10/2020  Lathaniel Legate [facility] Apr 25, 1956 [id number]   RNCM called patient at scheduled time.  Phone answered and patient not available at this time.[name]  RNCM will reschedule appointment.  Aida Raider RN, BSN [place]  Triad Curator - Managed Medicaid High Risk [phone].

## 2020-04-11 LAB — HIV ANTIBODY (ROUTINE TESTING W REFLEX): HIV Screen 4th Generation wRfx: NONREACTIVE

## 2020-04-11 LAB — PSA: Prostate Specific Ag, Serum: 115 ng/mL — ABNORMAL HIGH (ref 0.0–4.0)

## 2020-04-11 LAB — MICROALBUMIN / CREATININE URINE RATIO
Creatinine, Urine: 168.3 mg/dL
Microalb/Creat Ratio: 8 mg/g creat (ref 0–29)
Microalbumin, Urine: 12.7 ug/mL

## 2020-04-11 LAB — TSH: TSH: 0.301 u[IU]/mL — ABNORMAL LOW (ref 0.450–4.500)

## 2020-04-12 ENCOUNTER — Telehealth: Payer: Self-pay | Admitting: Internal Medicine

## 2020-04-12 NOTE — Telephone Encounter (Signed)
Phone call placed to patient this morning to discuss lab results.  Patient informed that his PSA which is a screening blood test for prostate cancer came back very elevated.  This likely suggest underlying prostate cancer.  Advised that he will need to see the urologist to be considered for prostate biopsy.  Referral was already submitted to the urologist for his complaints of hematuria and BPH symptoms.  Advised patient that if he does not receive a call for the urologist appointment within the next 1 to 2 weeks he should let me know. Informed that his thyroid level was also abnormal.  I will have the lab add some additional blood test to the TSH to confirm whether or not he has over functioning thyroid.  I will get back to him once I have the rest of the results.  Advised that weight loss can occur in over functioning thyroid.  If he has any advanced underlying cancer, this to can cause unexplained weight loss.  Patient expressed understanding of the plan.  All questions were answered.

## 2020-04-12 NOTE — Addendum Note (Signed)
Addended by: Karle Plumber B on: 04/12/2020 10:40 AM   Modules accepted: Orders

## 2020-04-13 LAB — URINE CYTOLOGY ANCILLARY ONLY
Chlamydia: NEGATIVE
Comment: NEGATIVE
Comment: NEGATIVE
Comment: NORMAL
Neisseria Gonorrhea: NEGATIVE
Trichomonas: NEGATIVE

## 2020-04-14 ENCOUNTER — Telehealth: Payer: Self-pay

## 2020-04-14 NOTE — Telephone Encounter (Signed)
Contacted pt to go over lab results pt is aware and doesn't have any questions or concerns 

## 2020-04-15 LAB — T3, FREE: T3, Free: 3 pg/mL (ref 2.0–4.4)

## 2020-04-15 LAB — T4, FREE: Free T4: 1 ng/dL (ref 0.82–1.77)

## 2020-04-15 LAB — SPECIMEN STATUS REPORT

## 2020-04-24 ENCOUNTER — Telehealth: Payer: Self-pay | Admitting: Licensed Clinical Social Worker

## 2020-04-24 ENCOUNTER — Other Ambulatory Visit: Payer: Self-pay

## 2020-04-24 ENCOUNTER — Other Ambulatory Visit: Payer: Self-pay | Admitting: Obstetrics and Gynecology

## 2020-04-24 NOTE — Telephone Encounter (Signed)
Call placed to patient regarding IBH referral from PCP to address transportation barriers. LCSW left message requesting a return call.

## 2020-04-24 NOTE — Patient Instructions (Signed)
Hi Troy Mclaughlin, thank you for speaking with me today.  Troy Mclaughlin was given information about Medicaid Managed Care team care coordination services as a part of their [place] Medicaid benefit. Troy Mclaughlin verbally consented to engagement with the [facility] Managed Care team.   For questions related to your [facility], please call: [phone] or visit the homepage here: [email or web address]  If you would like to schedule transportation through your [facility], please call the following number at least 2 days in advance of your appointment: [phone]      Patient Care Plan: General Plan of Care (Adult)    Problem Identified: Health Promotion or Disease Self-Management (General Plan of Care)     Long-Range Goal: Self-Management Plan Developed   Start Date: 04/24/2020  Expected End Date: 07/23/2020  This Visit's Progress: Not on track  Priority: High  Note:   Current Barriers:  [name] Knowledge Deficits related to disease management. . Care Coordination needs related to disease management. . Chronic Disease Management support and education needs related to chronic disease conditions-DM, HTN. . Film/video editor.  . Transportation barriers . Non-adherence to prescribed medication regimen . Difficulty obtaining medications . Does not adhere to prescribed medication regimen  Nurse Case Manager Clinical Goal(s):  [name] Over the next 7 days, patient will verbalize understanding of plan for obtaining medications and taking medications . Over the next 30 days, patient will attend all scheduled medical appointments: . Over the next 14 days, patient will demonstrate improved adherence to prescribed treatment plan for disease management as evidenced by taking prescribed medications. . Over the next 14  days, patient will demonstrate improved health management  independence as evidenced by taking prescribed medications. . Over the next 30 days, patient will verbalize basic understanding of disease process and self health management plan. . Over the next 30 days, the patient will demonstrate ongoing self health care management ability.  Interventions:  . Inter-disciplinary care team collaboration (see longitudinal plan of care) . Evaluation of current treatment plan  and patient's adherence to plan as established by provider. . Advised patient to contact provider as needed. . Reviewed medications with patient  . Collaborated with care guide regarding transportation and medications. . Discussed plans with patient for ongoing care management follow up and provided patient with direct contact information for care management team . Advised patient, providing education and rationale, to monitor blood pressure daily and record, calling provider for findings outside established parameters.  . Provided patient and spouse  with information about transportation. . Reviewed scheduled/upcoming provider appointments. . Advised patient, providing education and rationale, to check cbg and record, calling provider  for findings outside established parameters.   . Care Guide referral for transportation and medication needs.  Patient Goals/Self-Care Activities Over the next 7 days, patient will:  -Self administers medications as prescribed Attends all scheduled provider appointments Calls pharmacy for medication refills Calls provider office for new concerns or questions  Follow Up Plan: [name] will follow up with patient within 7 days. Care Guide will contact patient regarding transportation resources and medication needs. The patient has been provided with contact information for the Managed Medicaid care management team and has been advised to call with any health related questions or concerns.     Patient verbalizes understanding of instructions  provided today.   The Managed Medicaid care management team will reach out to the patient again over the next 7 days.  Aida Raider RN, BSN [place]  Triad Curator - Managed Medicaid High Risk [phone].

## 2020-04-24 NOTE — Patient Outreach (Signed)
Care Coordination - Case Manager  04/24/2020  Troy Mclaughlin [facility] 09-Jun-1955 [id number]  Subjective:  Troy Mclaughlin is an 64 y.o. year old male who is a primary patient of Troy Pier, MD.  Troy Mclaughlin was given information about Medicaid Managed Care team care coordination services today. Troy Mclaughlin agreed to services and verbal consent obtained  Review of patient status, laboratory and other test data was performed as part of evaluation for provision of services.  SDOH: SDOH Screenings   Alcohol Screen:    Last Alcohol Screening Score (AUDIT): Not on file  Depression (PHQ2-9): Low Risk    PHQ-2 Score: 0  Financial Resource Strain:    Difficulty of Paying Living Expenses: Not on file  Food Insecurity:    Worried About Charity fundraiser in the Last Year: Not on file   Ran Out of Food in the Last Year: Not on file  Housing:    Last Housing Risk Score: Not on file  Physical Activity:    Days of Exercise per Week: Not on file   Minutes of Exercise per Session: Not on file  Social Connections:    Frequency of Communication with Friends and Family: Not on file   Frequency of Social Gatherings with Friends and Family: Not on file   Attends Religious Services: Not on file   Active Member of Clubs or Organizations: Not on file   Attends Archivist Meetings: Not on file   Marital Status: Not on file  Stress:    Feeling of Stress : Not on file  Tobacco Use: High Risk   Smoking Tobacco Use: Current Every Day Smoker   Smokeless Tobacco Use: Never Used  Transportation Needs:    Film/video editor (Medical): Not on file   Lack of Transportation (Non-Medical): Not on file     Objective:    Allergies  Allergen Reactions   Lisinopril Anaphylaxis and Swelling    angioedema   Nsaids     Bleeding ulcers    Medications:    Medications Reviewed Today    Reviewed by Troy Medicus, RN (Registered Nurse) on 04/24/20 at [facility]  List Status: <None>  Medication Order Taking? Sig Documenting Provider Last Dose Status Informant  ACCU-CHEK FASTCLIX LANCETS [place] [id number] No Use as directed once daily  Patient not taking: Reported on 04/24/2020   Troy Pier, MD Not Taking Active Self  amLODipine (NORVASC) 5 MG tablet [id number] No Take 1 tablet (5 mg total) by mouth daily.  Patient not taking: Reported on 04/24/2020   Troy Pier, MD Not Taking Active   atorvastatin (LIPITOR) 10 MG tablet [id number] No Take 1 tablet (10 mg total) by mouth daily.  Patient not taking: Reported on 04/24/2020   Troy Pier, MD Not Taking Active   Blood Glucose Monitoring Suppl (ACCU-CHEK GUIDE) w/Device KIT [id number] No 1 each by Does not apply route daily.  Patient not taking: Reported on 01/17/2019   Troy Pier, MD Not Taking Active Self  carvedilol (COREG) 12.5 MG tablet [id number] No Take 0.5 tablets (6.25 mg total) by mouth 2 (two) times daily with a meal.  Patient not taking: Reported on 04/24/2020   Troy Pier, MD Not Taking Active   chlordiazePOXIDE (LIBRIUM) 25 MG capsule [id number] No 39m PO TID x 1D, then 25-582mPO BID X 1D, then 25-5039mO QD X 1D  Patient not taking: Reported on 04/24/2020   Troy GamblerD Not Taking  Active   glucose blood (ACCU-CHEK GUIDE) test strip [id number] No Use as instructed to test blood sugar once daily  Patient not taking: Reported on 01/17/2019   Troy Pier, MD Not Taking Active Self  naltrexone (DEPADE) 50 MG tablet [id number] No Take 1 tablet (50 mg total) by mouth daily.  Patient not taking: Reported on 04/24/2020   Troy Pier, MD Not Taking Active   nicotine (NICODERM CQ - DOSED IN MG/24 HOURS) 14 mg/24hr patch [id number] No Place 1 patch (14 mg total) onto the skin daily.  Patient not taking: Reported on 04/24/2020   Troy Pier, MD Not Taking Active   nitroGLYCERIN (NITROSTAT) 0.4 MG SL tablet [id number] No Place 1 tablet (0.4 mg total)  under the tongue every 5 (five) minutes as needed for chest pain.  Patient not taking: Reported on 03/25/2020   Troy Pier, MD Not Taking Active Self           Med Note Troy Mclaughlin   Tue Jul 09, 2019 10:44 AM) Pt ran out, needs refill  pantoprazole (PROTONIX) 40 MG tablet [id number] No Take 1 tablet (40 mg total) by mouth daily.  Patient not taking: Reported on 04/24/2020   Troy Gambler, MD Not Taking Active   tamsulosin [facility]) 0.4 MG CAPS capsule [id number] No Take 1 capsule (0.4 mg total) by mouth daily.  Patient not taking: Reported on 04/24/2020   Troy Pier, MD Not Taking Active          Patient Care Plan: General Plan of Care (Adult)    Problem Identified: Health Promotion or Disease Self-Management (General Plan of Care)     Long-Range Goal: Self-Management Plan Developed   Start Date: 04/24/2020  Expected End Date: 07/23/2020  This Visit's Progress: Not on track  Priority: High  Note:   Current Barriers:   Knowledge Deficits related to disease management.  Care Coordination needs related to disease management.  Chronic Disease Management support and education needs related to chronic disease conditions-DM, HTN.  Film/video editor.   Transportation barriers  Non-adherence to prescribed medication regimen  Difficulty obtaining medications  Does not adhere to prescribed medication regimen  Nurse Case Manager Clinical Goal(s):   Over the next 7 days, patient will verbalize understanding of plan for obtaining medications and taking medications  Over the next 30 days, patient will attend all scheduled medical appointments:  Over the next 14 days, patient will demonstrate improved adherence to prescribed treatment plan for disease management as evidenced by taking prescribed medications.  Over the next 14  days, patient will demonstrate improved health management independence as evidenced by taking prescribed medications.  Over the next 30  days, patient will verbalize basic understanding of disease process and self health management plan.  Over the next 30 days, the patient will demonstrate ongoing self health care management ability.  Interventions:   Inter-disciplinary care team collaboration (see longitudinal plan of care)  Evaluation of current treatment plan  and patient's adherence to plan as established by provider.  Advised patient to contact provider as needed.  Reviewed medications with patient   Collaborated with care guide regarding transportation and medications.  Discussed plans with patient for ongoing care management follow up and provided patient with direct contact information for care management team  Advised patient, providing education and rationale, to monitor blood pressure daily and record, calling provider for findings outside established parameters.   Provided patient and spouse  with information about transportation.  Reviewed scheduled/upcoming  provider appointments.  Advised patient, providing education and rationale, to check cbg and record, calling provider  for findings outside established parameters.    Care Guide referral for transportation and medication needs.  Patient Goals/Self-Care Activities Over the next 7 days, patient will:  -Self administers medications as prescribed Attends all scheduled provider appointments Calls pharmacy for medication refills Calls provider office for new concerns or questions  Follow Up Plan: [place] will follow up with patient within 7 days. Care Guide will contact patient regarding transportation resources and medication needs. The patient has been provided with contact information for the Managed Medicaid care management team and has been advised to call with any health related questions or concerns.            Plan: RNCM will follow up with patient within 7 days.

## 2020-04-28 ENCOUNTER — Telehealth (INDEPENDENT_AMBULATORY_CARE_PROVIDER_SITE_OTHER): Payer: Self-pay | Admitting: Licensed Clinical Social Worker

## 2020-04-28 NOTE — Telephone Encounter (Signed)
Call placed to patient regarding IBH referral to address resource needs. Pt shared that he utilizes Medicaid for transportation to medical appointments.   Pt reports that he is "hurting real bad" States this is due to hernia and needs follow up information regarding a referral placed by PCP. LCSW attempted to obtain additional information about said referral; however, pt became agitated stating, "they know what I'm talking about, just have them call me"   Pt requested for nurse or PCP to contact him ASAP. Pt is not interested in scheduling an appointment with LCSW at this time.

## 2020-04-29 ENCOUNTER — Other Ambulatory Visit: Payer: Self-pay | Admitting: Obstetrics and Gynecology

## 2020-04-29 ENCOUNTER — Telehealth: Payer: Self-pay | Admitting: Internal Medicine

## 2020-04-29 NOTE — Patient Instructions (Signed)
Hi Mr. Methot, sorry we missed you today - as a part of your Medicaid benefit, you are eligible for care management and care coordination services at no cost or copay. I was unable to reach you by phone today but would be happy to help you with your health related needs. Please feel free to call me at [phone].  A member of the Managed Medicaid care management team will reach out to you again over the next 7-14 days.   Aida Raider RN, BSN [place]  Triad Curator - Managed Medicaid High Risk [phone].

## 2020-04-29 NOTE — Telephone Encounter (Signed)
-----   Message from Ena Dawley sent at 04/29/2020  2:07 PM EST ----- Regarding: RE: General surgery referral Patient  is schedule  Lvm with appointment details   Scheduled Location [facility] Consult date 05/19/2020 03:10 PM Seen By Reather Laurence  ----- Message ----- From: [name], MD Sent: 04/29/2020  11:45 AM EST To: Ena Dawley Subject: General surgery referral                       We submitted a referral for him to see the general surgeon on 04/10/2020. So far patient has not received a call. Can you please look into this and try to get him scheduled as soon as possible?

## 2020-04-29 NOTE — Telephone Encounter (Signed)
-----   Message from Rebekah Chesterfield, LCSW sent at 04/28/2020  9:44 AM EST ----- Regarding: Pt is requesting a Call Back Call placed to patient regarding [facility] referral to address resource needs. Pt shared that he utilizes Medicaid for transportation to medical appointments.  Pt reports that he is "hurting real bad" States this is due to hernia and needs follow up information regarding a referral placed by PCP. LCSW attempted to obtain additional information about said referral; however, pt became agitated stating, "they know what I'm talking about, just have them call me"  Pt requested for nurse or PCP to contact him ASAP. Pt is not interested in scheduling an appointment with LCSW at this time.

## 2020-04-29 NOTE — Telephone Encounter (Signed)
Called pt stated was contacted from the surgeon office with and given appt on 05/11/2020. Made pt aware of Md instructions. Stressed the importance to go and be seen in the ED if currently experiencing a lot of pain in the area. Verbalized understanding

## 2020-04-29 NOTE — Patient Outreach (Signed)
Care Coordination  04/29/2020  Troy Mclaughlin August 15, 1955 [id number]    Medicaid Managed Care   Unsuccessful Outreach Note  04/29/2020 Name: Troy Mclaughlin MRN: [id number] DOB: 09/17/1955  Referred by: [name], MD Reason for referral : High Risk Managed Medicaid (unsuccessful telephone outreach)   An unsuccessful telephone outreach was attempted today. The patient was referred to the case management team for assistance with care management and care coordination.   Follow Up Plan: RNCM will follow up with patient in 7-14 days.  Aida Raider RN, BSN [place]  [facility] - Managed Medicaid High Risk [phone].

## 2020-05-03 ENCOUNTER — Encounter (HOSPITAL_COMMUNITY): Payer: Self-pay | Admitting: *Deleted

## 2020-05-03 ENCOUNTER — Emergency Department (HOSPITAL_COMMUNITY)
Admission: EM | Admit: 2020-05-03 | Discharge: 2020-05-03 | Disposition: A | Discharge status: Home / Self Care | Payer: Medicaid Other | Attending: Emergency Medicine | Admitting: Emergency Medicine

## 2020-05-03 ENCOUNTER — Other Ambulatory Visit: Payer: Self-pay

## 2020-05-03 DIAGNOSIS — R319 Hematuria, unspecified: Secondary | ICD-10-CM | POA: Insufficient documentation

## 2020-05-03 DIAGNOSIS — Z5321 Procedure and treatment not carried out due to patient leaving prior to being seen by health care provider: Secondary | ICD-10-CM | POA: Diagnosis not present

## 2020-05-03 DIAGNOSIS — N50819 Testicular pain, unspecified: Secondary | ICD-10-CM | POA: Diagnosis not present

## 2020-05-03 HISTORY — DX: Malignant (primary) neoplasm, unspecified: C80.1

## 2020-05-03 NOTE — ED Triage Notes (Signed)
Pt has an appointment with [facility] on 12/28. Colon Cancer. Presents today due to hematuria and a knot on his scrotum.

## 2020-05-04 ENCOUNTER — Ambulatory Visit (HOSPITAL_COMMUNITY): Payer: Medicaid Other

## 2020-05-04 ENCOUNTER — Other Ambulatory Visit: Payer: Self-pay

## 2020-05-04 NOTE — Patient Instructions (Signed)
Visit Information  Mr. JOHNNY GORTER  - as a part of your Medicaid benefit, you are eligible for care management and care coordination services at no cost or copay. I was unable to reach you by phone today but would be happy to help you with your health related needs. Please feel free to call me @ [phone].   A member of the Managed Medicaid care management team will reach out to you again over the next 7 days.   [name], BSW, [place]  High Risk Managed Medicaid Team

## 2020-05-04 NOTE — Patient Outreach (Signed)
Care Coordination  05/04/2020  Yukio Bisping Martinezgarcia 09-May-1956 [id number]   Medicaid Managed Care   Unsuccessful Outreach Note  05/04/2020 Name: MANDELA BELLO MRN: [id number] DOB: Aug 06, 1955  Referred by: [name], MD Reason for referral : High Risk Managed Medicaid (HR MM Telephone Outreach)   An unsuccessful telephone outreach was attempted today. The patient was referred to the case management team for assistance with care management and care coordination.   Follow Up Plan: BSW will follow up in 7 days.  [name], BSW, [place]  High Risk Managed Medicaid Team

## 2020-05-05 ENCOUNTER — Other Ambulatory Visit: Payer: Self-pay

## 2020-05-05 ENCOUNTER — Other Ambulatory Visit: Payer: Self-pay | Admitting: Obstetrics and Gynecology

## 2020-05-05 NOTE — Patient Outreach (Signed)
Care Coordination - Case Manager  05/05/2020  Everado Pillsbury [facility] 23-Sep-1955 [id number]  Subjective:  JONNATAN HANNERS is an 64 y.o. year old male who is a primary patient of [name], MD.  Mr. Gamel was given information about Medicaid Managed Care team care coordination services today. Kathalene Frames Beaupre agreed to services and verbal consent obtained  Review of patient status, laboratory and other test data was performed as part of evaluation for provision of services.  SDOH: SDOH Screenings   Alcohol Screen: Not on file  Depression (PHQ2-9): Low Risk    PHQ-2 Score: 0  Financial Resource Strain: High Risk   Difficulty of Paying Living Expenses: Very hard  Food Insecurity: Not on file  Housing: Not on file  Physical Activity: Not on file  Social Connections: Not on file  Stress: Not on file  Tobacco Use: High Risk   Smoking Tobacco Use: Current Every Day Smoker   Smokeless Tobacco Use: Never Used  Transportation Needs: Not on file   SDOH Interventions   Flowsheet Row Most Recent Value  SDOH Interventions   Financial Strain Interventions Other (Comment)  [Managed Medicaid referral]      Objective:    Allergies  Allergen Reactions   Lisinopril Anaphylaxis and Swelling    angioedema   Nsaids     Bleeding ulcers    Medications:    Medications Reviewed Today    Reviewed by Gayla Medicus, RN (Registered Nurse) on 05/05/20 at 1533  Med List Status: <None>  Medication Order Taking? Sig Documenting Provider Last Dose Status Informant  ACCU-CHEK FASTCLIX LANCETS [place] [id number] No Use as directed once daily  Patient not taking: Reported on 04/24/2020   [name], MD Not Taking Active Self  amLODipine (NORVASC) 5 MG tablet [id number] No Take 1 tablet (5 mg total) by mouth daily.  Patient not taking: Reported on 04/24/2020   [name], MD Not Taking Active   atorvastatin (LIPITOR) 10 MG tablet [id number] No Take 1 tablet (10 mg total) by  mouth daily.  Patient not taking: Reported on 04/24/2020   [name], MD Not Taking Active   Blood Glucose Monitoring Suppl (ACCU-CHEK GUIDE) w/Device KIT [id number] No 1 each by Does not apply route daily.  Patient not taking: Reported on 01/17/2019   [name], MD Not Taking Active Self  carvedilol (COREG) 12.5 MG tablet [id number] No Take 0.5 tablets (6.25 mg total) by mouth 2 (two) times daily with a meal.  Patient not taking: Reported on 04/24/2020   [name], MD Not Taking Active   chlordiazePOXIDE (LIBRIUM) 25 MG capsule [id number] No 40m PO TID x 1D, then 25-514mPO BID X 1D, then 25-5036mO QD X 1D  Patient not taking: Reported on 04/24/2020   GolSherwood GamblerD Not Taking Active   glucose blood (ACCU-CHEK GUIDE) test strip [id number] Use as instructed to test blood sugar once daily  Patient not taking: Reported on 01/17/2019   JohLadell PierD Not Taking Active Self  naltrexone (DEPADE) 50 MG tablet [id number] Take 1 tablet (50 mg total) by mouth daily.  Patient not taking: Reported on 04/24/2020   JohLadell PierD Not Taking Active   nicotine (NICODERM CQ - DOSED IN MG/24 HOURS) 14 mg/24hr patch [id number] Place 1 patch (14 mg total) onto the skin daily.  Patient not taking: Reported on 04/24/2020   JohLadell PierD Not Taking Active   nitroGLYCERIN (NITROSTAT)  0.4 MG SL tablet [id number] No Place 1 tablet (0.4 mg total) under the tongue every 5 (five) minutes as needed for chest pain.  Patient not taking: Reported on 03/25/2020   [name], MD Not Taking Active Self           Med Note Marcell Barlow   Tue Jul 09, 2019 10:44 AM) Pt ran out, needs refill  pantoprazole (PROTONIX) 40 MG tablet [id number] No Take 1 tablet (40 mg total) by mouth daily.  Patient not taking: Reported on 04/24/2020   Sherwood Gambler, MD Not Taking Active   tamsulosin [facility]) 0.4 MG CAPS capsule [id number] No Take 1 capsule (0.4 mg total) by mouth  daily.  Patient not taking: Reported on 04/24/2020   [name], MD Not Taking Active          Patient Care Plan: General Plan of Care (Adult)    Problem Identified: Health Promotion or Disease Self-Management (General Plan of Care)     Long-Range Goal: Self-Management Plan Developed   Start Date: 04/24/2020  Expected End Date: 07/23/2020  Recent Progress: Not on track  Priority: High  Note:   Current Barriers:   Knowledge Deficits related to disease management.  Care Coordination needs related to disease management.  Chronic Disease Management support and education needs related to chronic disease conditions-DM, HTN.  Update 05/05/20:  Patient states he does not have glucometer or BP cuff.  Film/video editor.   Transportation barriers  Update 05/05/20:  Patient states he has used Medicaid transportation before and has contact information for [facility] transportation provided on 04/24/20.  Non-adherence to prescribed medication regimen  Difficulty obtaining medications  Does not adhere to prescribed medication regimen  Nurse Case Manager Clinical Goal(s):   Over the next 7 days, patient will verbalize understanding of plan for obtaining medications and taking medications  Over the next 30 days, patient will attend all scheduled medical appointments:  Over the next 14 days, patient will demonstrate improved adherence to prescribed treatment plan for disease management as evidenced by taking prescribed medications.  Over the next 14  days, patient will demonstrate improved health management independence as evidenced by taking prescribed medications.  Over the next 30 days, patient will verbalize basic understanding of disease process and self health management plan.  Over the next 30 days, the patient will demonstrate ongoing self health care management ability.  Interventions:   Inter-disciplinary care team collaboration (see longitudinal plan of  care)  Evaluation of current treatment plan  and patient's adherence to plan as established by provider.  Advised patient to contact provider as needed.  Reviewed medications with patient   Collaborated with care guide regarding transportation and medications.  Discussed plans with patient for ongoing care management follow up and provided patient with direct contact information for care management team  Advised patient, providing education and rationale, to monitor blood pressure daily and record, calling provider for findings outside established parameters.   Provided patient and spouse  with information about transportation.  Reviewed scheduled/upcoming provider appointments.  Advised patient, providing education and rationale, to check cbg and record, calling provider  for findings outside established parameters.    Care Guide referral for transportation and medication needs.  Update 05/05/20:  BSW attempted to call patient 05/04/20 to discuss needs with no success-rescheduled.  Patient Goals/Self-Care Activities Over the next 7 days, patient will:  -Self administers medications as prescribed Attends all scheduled provider appointments Calls pharmacy for medication refills Calls provider office for  new concerns or questions  Follow Up Plan: [place] will follow up with patient within 7-14 days and follow up with PCP regarding glucometer and BP cuff for patient. Care Guide will contact patient regarding transportation resources and medication needs. The patient has been provided with contact information for the Managed Medicaid care management team and has been advised to call with any health related questions or concerns.              Follow-up:  Patient agrees to Care Plan and Follow-up.

## 2020-05-05 NOTE — Patient Instructions (Signed)
Hi Troy Mclaughlin you for speaking with me today.  Troy Mclaughlin was given information about Medicaid Managed Care team care coordination services as a part of their [place] Medicaid benefit. Troy Mclaughlin verbally consented to engagement with the [facility] Managed Care team.   For questions related to your [facility], please call: [phone] or visit the homepage here: [email or web address]  If you would like to schedule transportation through your [facility], please call the following number at least 2 days in advance of your appointment: [phone]   Patient Care Plan: General Plan of Care (Adult)    Problem Identified: Health Promotion or Disease Self-Management (General Plan of Care)     Long-Range Goal: Self-Management Plan Developed   Start Date: 04/24/2020  Expected End Date: 07/23/2020  Recent Progress: Not on track  Priority: High  Note:   Current Barriers:  [name] Knowledge Deficits related to disease management. . Care Coordination needs related to disease management. . Chronic Disease Management support and education needs related to chronic disease conditions-DM, HTN. [name] Update 05/05/20:  Patient states he does not have glucometer or BP cuff. . Film/video editor.  . Transportation barriers . Update 05/05/20:  Patient states he has used Medicaid transportation before and has contact information for [facility] transportation provided on 04/24/20. . Non-adherence to prescribed medication regimen . Difficulty obtaining medications . Does not adhere to prescribed medication regimen  Nurse Case Manager Clinical Goal(s):  [name] Over the next 7 days, patient will verbalize understanding of plan for obtaining medications and taking medications . Over the next 30 days, patient will attend all scheduled medical appointments: . Over the next 14 days, patient will  demonstrate improved adherence to prescribed treatment plan for disease management as evidenced by taking prescribed medications. . Over the next 14  days, patient will demonstrate improved health management independence as evidenced by taking prescribed medications. . Over the next 30 days, patient will verbalize basic understanding of disease process and self health management plan. . Over the next 30 days, the patient will demonstrate ongoing self health care management ability.  Interventions:  . Inter-disciplinary care team collaboration (see longitudinal plan of care) . Evaluation of current treatment plan  and patient's adherence to plan as established by provider. . Advised patient to contact provider as needed. . Reviewed medications with patient  . Collaborated with care guide regarding transportation and medications. . Discussed plans with patient for ongoing care management follow up and provided patient with direct contact information for care management team . Advised patient, providing education and rationale, to monitor blood pressure daily and record, calling provider for findings outside established parameters.  . Provided patient and spouse  with information about transportation. . Reviewed scheduled/upcoming provider appointments. . Advised patient, providing education and rationale, to check cbg and record, calling provider  for findings outside established parameters.   . Care Guide referral for transportation and medication needs. [name] Update 05/05/20:  BSW attempted to call patient 05/04/20 to discuss needs with no success-rescheduled.  Patient Goals/Self-Care Activities Over the next 7 days, patient will:  -Self administers medications as prescribed Attends all scheduled provider appointments Calls pharmacy for medication refills Calls provider office for new concerns or questions  Follow Up Plan: [place] will follow up with patient within 7-14 days and follow  up with PCP regarding glucometer and BP cuff for patient. Care Guide will contact patient regarding transportation resources and medication needs. The patient  has been provided with contact information for the Managed Medicaid care management team and has been advised to call with any health related questions or concerns.       Patient verbalizes understanding of instructions provided today.   The Managed Medicaid care management team will reach out to the patient again over the next 7-14 days.  The patient has been provided with contact information for the Managed Medicaid care management team and has been advised to call with any health related questions or concerns.   Aida Raider RN, BSN   [facility] - Managed Medicaid High Risk [phone].

## 2020-05-10 ENCOUNTER — Emergency Department (HOSPITAL_COMMUNITY)
Admission: EM | Admit: 2020-05-10 | Discharge: 2020-05-10 | Disposition: A | Discharge status: Home / Self Care | Payer: Medicaid Other | Attending: Emergency Medicine | Admitting: Emergency Medicine

## 2020-05-10 ENCOUNTER — Emergency Department (HOSPITAL_COMMUNITY): Payer: Medicaid Other

## 2020-05-10 DIAGNOSIS — E119 Type 2 diabetes mellitus without complications: Secondary | ICD-10-CM | POA: Diagnosis not present

## 2020-05-10 DIAGNOSIS — I10 Essential (primary) hypertension: Secondary | ICD-10-CM | POA: Diagnosis not present

## 2020-05-10 DIAGNOSIS — R52 Pain, unspecified: Secondary | ICD-10-CM | POA: Diagnosis not present

## 2020-05-10 DIAGNOSIS — Z859 Personal history of malignant neoplasm, unspecified: Secondary | ICD-10-CM | POA: Insufficient documentation

## 2020-05-10 DIAGNOSIS — Z79899 Other long term (current) drug therapy: Secondary | ICD-10-CM | POA: Insufficient documentation

## 2020-05-10 DIAGNOSIS — I700 Atherosclerosis of aorta: Secondary | ICD-10-CM | POA: Diagnosis not present

## 2020-05-10 DIAGNOSIS — F17210 Nicotine dependence, cigarettes, uncomplicated: Secondary | ICD-10-CM | POA: Insufficient documentation

## 2020-05-10 DIAGNOSIS — J439 Emphysema, unspecified: Secondary | ICD-10-CM | POA: Insufficient documentation

## 2020-05-10 DIAGNOSIS — S32501S Unspecified fracture of right pubis, sequela: Secondary | ICD-10-CM | POA: Diagnosis not present

## 2020-05-10 DIAGNOSIS — I2510 Atherosclerotic heart disease of native coronary artery without angina pectoris: Secondary | ICD-10-CM | POA: Diagnosis not present

## 2020-05-10 DIAGNOSIS — R1031 Right lower quadrant pain: Secondary | ICD-10-CM | POA: Diagnosis present

## 2020-05-10 DIAGNOSIS — R319 Hematuria, unspecified: Secondary | ICD-10-CM | POA: Insufficient documentation

## 2020-05-10 DIAGNOSIS — X58XXXA Exposure to other specified factors, initial encounter: Secondary | ICD-10-CM | POA: Diagnosis not present

## 2020-05-10 DIAGNOSIS — Z955 Presence of coronary angioplasty implant and graft: Secondary | ICD-10-CM | POA: Diagnosis not present

## 2020-05-10 DIAGNOSIS — R58 Hemorrhage, not elsewhere classified: Secondary | ICD-10-CM | POA: Diagnosis not present

## 2020-05-10 DIAGNOSIS — R030 Elevated blood-pressure reading, without diagnosis of hypertension: Secondary | ICD-10-CM

## 2020-05-10 DIAGNOSIS — N200 Calculus of kidney: Secondary | ICD-10-CM | POA: Diagnosis not present

## 2020-05-10 DIAGNOSIS — S32501A Unspecified fracture of right pubis, initial encounter for closed fracture: Secondary | ICD-10-CM | POA: Diagnosis not present

## 2020-05-10 DIAGNOSIS — R109 Unspecified abdominal pain: Secondary | ICD-10-CM | POA: Diagnosis not present

## 2020-05-10 LAB — URINALYSIS, ROUTINE W REFLEX MICROSCOPIC
Bilirubin Urine: NEGATIVE
Glucose, UA: NEGATIVE mg/dL
Hgb urine dipstick: NEGATIVE
Ketones, ur: NEGATIVE mg/dL
Leukocytes,Ua: NEGATIVE
Nitrite: NEGATIVE
Protein, ur: NEGATIVE mg/dL
Specific Gravity, Urine: 1.021 (ref 1.005–1.030)
pH: 5 (ref 5.0–8.0)

## 2020-05-10 LAB — COMPREHENSIVE METABOLIC PANEL
ALT: 16 U/L (ref 0–44)
AST: 23 U/L (ref 15–41)
Albumin: 3.9 g/dL (ref 3.5–5.0)
Alkaline Phosphatase: 72 U/L (ref 38–126)
Anion gap: 10 (ref 5–15)
BUN: 11 mg/dL (ref 8–23)
CO2: 24 mmol/L (ref 22–32)
Calcium: 9.3 mg/dL (ref 8.9–10.3)
Chloride: 103 mmol/L (ref 98–111)
Creatinine, Ser: 0.78 mg/dL (ref 0.61–1.24)
GFR, Estimated: >60 mL/min (ref >60)
Glucose, Bld: 90 mg/dL (ref 70–99)
Potassium: 3.9 mmol/L (ref 3.5–5.1)
Sodium: 137 mmol/L (ref 135–145)
Total Bilirubin: 0.5 mg/dL (ref 0.3–1.2)
Total Protein: 7.5 g/dL (ref 6.5–8.1)

## 2020-05-10 LAB — CBC
HCT: 40 % (ref 39.0–52.0)
Hemoglobin: 13.4 g/dL (ref 13.0–17.0)
MCH: 25.1 pg — ABNORMAL LOW (ref 26.0–34.0)
MCHC: 33.5 g/dL (ref 30.0–36.0)
MCV: 74.9 fL — ABNORMAL LOW (ref 80.0–100.0)
Platelets: 155 10*3/uL (ref 150–400)
RBC: 5.34 MIL/uL (ref 4.22–5.81)
RDW: 14.3 % (ref 11.5–15.5)
WBC: 6.4 10*3/uL (ref 4.0–10.5)
nRBC: 0 % (ref 0.0–0.2)

## 2020-05-10 LAB — LIPASE, BLOOD: Lipase: 24 U/L (ref 11–51)

## 2020-05-10 MED ORDER — IOHEXOL 300 MG/ML  SOLN
100.0000 mL | Freq: Once | INTRAMUSCULAR | Status: AC | PRN
  Administered 2020-05-10: 15:00:00 100 mL via INTRAVENOUS

## 2020-05-10 MED ORDER — FENTANYL CITRATE (PF) 100 MCG/2ML IJ SOLN
50.0000 ug | Freq: Once | INTRAMUSCULAR | Status: AC
  Administered 2020-05-10: 14:00:00 50 ug via INTRAVENOUS
  Filled 2020-05-10: qty 2

## 2020-05-10 MED ORDER — SODIUM CHLORIDE 0.9 % IV BOLUS
500.0000 mL | Freq: Once | INTRAVENOUS | Status: AC
  Administered 2020-05-10: 14:00:00 500 mL via INTRAVENOUS

## 2020-05-10 NOTE — ED Notes (Signed)
Patient transported to CT 

## 2020-05-10 NOTE — ED Triage Notes (Addendum)
Transported by [facility] from home--patient reports abdominal pain related to hernia and states that he has blood in his urine.
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
## 2020-05-10 NOTE — Discharge Instructions (Addendum)
At this time there does not appear to be the presence of an emergent medical condition, however there is always the potential for conditions to change. Please read and follow the below instructions.  Please return to the Emergency Department immediately for any new or worsening symptoms. Please be sure to follow up with your Primary Care Provider within one week regarding your visit today; please call their office to schedule an appointment even if you are feeling better for a follow-up visit. Your CT scan showed posttraumatic changes at your pubic symphysis which account for the hard bump in your groin.  Please discuss that with your primary care provider at your follow-up visit. Your CT scan also showed emphysema, a 2.8 cm hemangioma of your liver, a 5 mm stable nonobstructing left kidney stone, postsurgical changes of your left upper quadrant, aortic atherosclerosis.  Please discuss these incidental findings with your primary care provider at your follow-up visit. Additionally your blood pressure was elevated in the emergency department today.  Please have your blood pressure rechecked by your primary care provider and discuss medication management with them within 1 week. You may call the urologist Dr. Diona Fanti for follow-up concerning your blood in the urine.  There was no blood seen in your urinalysis today.  Go to the nearest Emergency Department immediately if: You have fever or chills You cannot eat or drink without vomiting. You are confused. You have a rapid heartbeat while at rest. You have shaking or chills. You feel extremely weak. Get a very bad headache. Start to feel mixed up (confused). Feel weak or numb. Feel faint. Have very bad pain in your: Chest. Belly (abdomen). Throw up more than once. Have trouble breathing. You have any new/concerning or worsening of symptoms   Please read the additional information packets attached to your discharge summary.  Do not take  your medicine if  develop an itchy rash, swelling in your mouth or lips, or difficulty breathing; call 911 and seek immediate emergency medical attention if this occurs.  You may review your lab tests and imaging results in their entirety on your MyChart account.  Please discuss all results of fully with your primary care provider and other specialist at your follow-up visit.  Note: Portions of this text may have been transcribed using voice recognition software. Every effort was made to ensure accuracy; however, inadvertent computerized transcription errors may still be present.

## 2020-05-11 ENCOUNTER — Telehealth: Payer: Self-pay | Admitting: Internal Medicine

## 2020-05-11 ENCOUNTER — Telehealth: Payer: Self-pay | Admitting: *Deleted

## 2020-05-11 NOTE — Telephone Encounter (Signed)
Error

## 2020-05-11 NOTE — Telephone Encounter (Signed)
Copied from [place] [phone]. Topic: General - Inquiry >> May 11, 2020 12:56 PM [name] wrote: Reason for CRM: Pt called asking that Dr. Wynetta Emery please call him back regarding his ED visit yesterday  CB# Pt did not know his telephone number.

## 2020-05-12 NOTE — Telephone Encounter (Signed)
Pt states he is not able to tolerate the pain. Pt states they gave him a shot. Pt states when he goes to the bathroom his pain is a 10/10. Pt states he is just needing medicine till he see the surgeon on 12/28. Pt states he would like rx sent to [facility] on [address]

## 2020-05-13 ENCOUNTER — Other Ambulatory Visit: Payer: Self-pay | Admitting: Obstetrics and Gynecology

## 2020-05-13 ENCOUNTER — Other Ambulatory Visit: Payer: Self-pay

## 2020-05-13 MED ORDER — TRAMADOL HCL 50 MG PO TABS: 50.0000 mg | ORAL_TABLET | Freq: Three times a day (TID) | ORAL | 0 refills | Status: DC | PRN

## 2020-05-13 NOTE — Telephone Encounter (Signed)
Contacted pt to go over Dr. Wynetta Emery message. Pt states he has not picked up the Naltrexone because he didn't any money. Pt states he understands not to take both medications together if he gets them.   I have spoken with Alinda Sierras regarding Urology referral and they have not setup an appt for him as of yet, but she has marked referral urgent.

## 2020-05-13 NOTE — Patient Instructions (Signed)
Visit Information  Mr. Guaman was given information about Medicaid Managed Care team care coordination services as a part of their [place] Medicaid benefit. Felix Meras Kirshner verbally consented to engagement with the [facility] Managed Care team.   Please remember your transportation has been set up for your appointment on 05/19/20. [employer] will be at your home at 2:25 pm on 05/19/20. Your transportation confirmation number is [id number].  For questions related to your [facility], please call: [phone] or visit the homepage here: [email or web address]  If you would like to schedule transportation through your [facility], please call the following number at least 2 days in advance of your appointment: [phone]  Goals Addressed   None     Social Worker will follow up in 14 days.[name]

## 2020-05-13 NOTE — Patient Instructions (Addendum)
Visit Information  Mr. Millison was given information about Medicaid Managed Care team care coordination services as a part of their [place] Medicaid benefit. Paton Shampine Temme verbally consented to engagement with the [facility] Managed Care team.   For questions related to your [facility], please call: [phone] or visit the homepage here: [email or web address]  If you would like to schedule transportation through your [facility], please call the following number at least 2 days in advance of your appointment: [phone]   Patient Care Plan: General Plan of Care (Adult)    Problem Identified: Health Promotion or Disease Self-Management (General Plan of Care)     Long-Range Goal: Self-Management Plan Developed   Start Date: 04/24/2020  Expected End Date: 07/23/2020  Recent Progress: Not on track  Priority: High  Note:   Current Barriers:   Knowledge Deficits related to disease management.  Care Coordination needs related to disease management.  Chronic Disease Management support and education needs related to chronic disease conditions-DM, HTN.  Update 05/05/20:  Patient states he does not have glucometer or BP cuff.  Film/video editor.   Transportation barriers  Update 05/05/20:  Patient states he has used Medicaid transportation before and has contact information for [facility] transportation provided on 04/24/20.  Non-adherence to prescribed medication regimen  Difficulty obtaining medications  Does not adhere to prescribed medication regimen  Nurse Case Manager Clinical Goal(s):   Over the next 7 days, patient will verbalize understanding of plan for obtaining medications and taking medications  Over the next 30 days, patient will attend all scheduled medical appointments:  Over the next 14 days, patient will demonstrate improved adherence to  prescribed treatment plan for disease management as evidenced by taking prescribed medications.  Over the next 14  days, patient will demonstrate improved health management independence as evidenced by taking prescribed medications.  Over the next 30 days, patient will verbalize basic understanding of disease process and self health management plan.  Over the next 30 days, the patient will demonstrate ongoing self health care management ability.  Interventions:   Inter-disciplinary care team collaboration (see longitudinal plan of care)  Evaluation of current treatment plan  and patient's adherence to plan as established by provider.  Advised patient to contact provider as needed.  Reviewed medications with patient   Collaborated with care guide regarding transportation and medications.  Discussed plans with patient for ongoing care management follow up and provided patient with direct contact information for care management team  Advised patient, providing education and rationale, to monitor blood pressure daily and record, calling provider for findings outside established parameters.   Provided patient and spouse  with information about transportation.  Reviewed scheduled/upcoming provider appointments.  Advised patient, providing education and rationale, to check cbg and record, calling provider  for findings outside established parameters.    Care Guide referral for transportation and medication needs.  Update 05/05/20:  BSW attempted to call patient 05/04/20 to discuss needs with no success-rescheduled.  Patient Goals/Self-Care Activities Over the next 7 days, patient will:  -Self administers medications as prescribed Attends all scheduled provider appointments.  Update 05/13/20-Patient has appointment at [facility] 05/19/20 at 310.   Calls pharmacy for medication refills Calls provider office for new concerns or questions  Follow Up Plan: [place]  will follow up with patient within 7-14 days and follow up with PCP regarding glucometer and BP cuff for patient. Care Guide will contact patient regarding  transportation resources and medication needs. The patient has been provided with contact information for the Managed Medicaid care management team and has been advised to call with any health related questions or concerns.             Patient verbalizes understanding of instructions provided today.   The Managed Medicaid care management team will reach out to the patient again over the next 14 days.  The patient has been provided with contact information for the Managed Medicaid care management team and has been advised to call with any health related questions or concerns.   Aida Raider RN, BSN [place]   [facility] [employer] - Managed Medicaid High Risk [phone]

## 2020-05-19 DIAGNOSIS — R1031 Right lower quadrant pain: Secondary | ICD-10-CM | POA: Diagnosis not present

## 2020-05-25 ENCOUNTER — Other Ambulatory Visit: Payer: Self-pay | Admitting: Surgery

## 2020-05-25 DIAGNOSIS — R1031 Right lower quadrant pain: Secondary | ICD-10-CM

## 2020-05-26 ENCOUNTER — Other Ambulatory Visit (HOSPITAL_COMMUNITY): Payer: Self-pay | Admitting: Surgery

## 2020-05-26 DIAGNOSIS — R1031 Right lower quadrant pain: Secondary | ICD-10-CM

## 2020-05-27 ENCOUNTER — Other Ambulatory Visit: Payer: Self-pay | Admitting: Obstetrics and Gynecology

## 2020-05-27 ENCOUNTER — Other Ambulatory Visit (HOSPITAL_COMMUNITY): Payer: Self-pay | Admitting: Surgery

## 2020-05-27 DIAGNOSIS — R319 Hematuria, unspecified: Secondary | ICD-10-CM

## 2020-05-27 DIAGNOSIS — R1031 Right lower quadrant pain: Secondary | ICD-10-CM

## 2020-05-27 NOTE — Patient Outreach (Signed)
Care Coordination  05/27/2020  Draydon [place] July 07, 1955 [id number]    Medicaid Managed Care   Unsuccessful Outreach Note  05/27/2020 Name: Troy Mclaughlin MRN: [id number] DOB: 19-Oct-1955  Referred by: Marcine Matar, MD Reason for referral : High Risk Managed Medicaid (Unsuccessful telephone outreach)   An unsuccessful telephone outreach was attempted today. The patient was referred to the case management team for assistance with care management and care coordination.   Follow Up Plan: A member of the Managed Medicaid team will reach out to the patient in the next 7 days to reschedule appointment.  Kathi Der RN, BSN [place]  [employer], production - Managed Medicaid High Risk [phone].

## 2020-05-27 NOTE — Patient Instructions (Signed)
Visit Information  Mr. CAMBELL STANEK  - as a part of your Medicaid benefit, you are eligible for care management and care coordination services at no cost or copay. I was unable to reach you by phone today but would be happy to help you with your health related needs. Please feel free to call me @ [phone].  A member of the Managed Medicaid care management team will reach out to you again over the next 7 days.   Kathi Der RN, BSN [place]  [employer], [employer] - Managed Medicaid High Risk [phone].

## 2020-06-01 ENCOUNTER — Other Ambulatory Visit: Payer: Medicaid Other

## 2020-06-01 ENCOUNTER — Other Ambulatory Visit: Payer: Self-pay

## 2020-06-01 NOTE — Patient Outreach (Signed)
Medicaid Managed Care Social Work Note  06/01/2020 Name:  Troy Mclaughlin MRN:  [id number] DOB:  04-Oct-1955  Troy Mclaughlin is an 65 y.o. year old male who is a primary patient of Troy Pier, MD.  The Medicaid Managed Care Coordination team was consulted for assistance with:  Transportation Needs   Troy Mclaughlin was given information about Medicaid Managed CareCoordination services today. Troy Mclaughlin agreed to services and verbal consent obtained.  Engaged with patient  for by telephone forfollow up visit in response to referral for case management and/or care coordination services.   Assessments/Interventions:  Review of past medical history, allergies, medications, health status, including review of consultants reports, laboratory and other test data, was performed as part of comprehensive evaluation and provision of chronic care management services.  SDOH: (Social Determinant of Health) assessments and interventions performed:  BSW encouraged patient to set up transportation on his own for his upcoming appointment on 06/04/20. BSW provided patient with the phone number on his last appointment. Patient stated he did still have the phone number.   Advanced Directives Status:  Not addressed in this encounter.  Care Plan                 Allergies  Allergen Reactions  . Lisinopril Anaphylaxis and Swelling    angioedema  . Nsaids Other (See Comments)    Bleeding ulcers    Medications Reviewed Today    Reviewed by Troy Medicus, RN (Registered Nurse) on 05/13/20 at 60  Med List Status: <None>  Medication Order Taking? Sig Documenting Provider Last Dose Status Informant  ACCU-CHEK FASTCLIX LANCETS [place] [id number] No Use as directed once daily  Patient not taking: No sig reported   Troy Pier, MD Not Taking Active Multiple Informants  amLODipine (NORVASC) 5 MG tablet [id number] No Take 1 tablet (5 mg total) by mouth daily.  Patient not taking: No sig reported    Troy Pier, MD Not Taking Unknown time Active Multiple Informants  atorvastatin (LIPITOR) 10 MG tablet [id number] No Take 1 tablet (10 mg total) by mouth daily.  Patient not taking: No sig reported   Troy Pier, MD Not Taking Unknown time Active Multiple Informants  Blood Glucose Monitoring Suppl (ACCU-CHEK GUIDE) w/Device KIT [id number] No 1 each by Does not apply route daily.  Patient not taking: No sig reported   Troy Pier, MD Not Taking Active Multiple Informants  carvedilol (COREG) 12.5 MG tablet [id number] No Take 0.5 tablets (6.25 mg total) by mouth 2 (two) times daily with a meal.  Patient not taking: No sig reported   Troy Pier, MD Not Taking Unknown time Active Multiple Informants  chlordiazePOXIDE (LIBRIUM) 25 MG capsule [id number] No 53m PO TID x 1D, then 25-580mPO BID X 1D, then 25-5041mO QD X 1D  Patient not taking: No sig reported   Troy GamblerD Not Taking Unknown time Active Multiple Informants  glucose blood (ACCU-CHEK GUIDE) test strip [id number] Use as instructed to test blood sugar once daily  Patient not taking: No sig reported   Troy PierD Not Taking Active Multiple Informants  naltrexone (DEPADE) 50 MG tablet [id number] Take 1 tablet (50 mg total) by mouth daily.  Patient not taking: No sig reported   Troy PierD Not Taking Unknown time Active Multiple Informants  nicotine (NICODERM CQ - DOSED IN MG/24 HOURS) 14 mg/24hr patch [id number] Place 1 patch (14 mg total) onto the  skin daily.  Patient not taking: No sig reported   Troy Pier, MD Not Taking Unknown time Active Multiple Informants  nitroGLYCERIN (NITROSTAT) 0.4 MG SL tablet [id number] No Place 1 tablet (0.4 mg total) under the tongue every 5 (five) minutes as needed for chest pain.  Patient not taking: No sig reported   Troy Pier, MD Not Taking Unknown time Active Multiple Informants           Med Note Troy Mclaughlin   Tue  Jul 09, 2019 10:44 AM) Pt ran out, needs refill  pantoprazole (PROTONIX) 40 MG tablet [id number] No Take 1 tablet (40 mg total) by mouth daily.  Patient not taking: No sig reported   Troy Gambler, MD Not Taking Unknown time Active Multiple Informants  tamsulosin (FLOMAX) 0.4 MG CAPS capsule [id number] No Take 1 capsule (0.4 mg total) by mouth daily.  Patient not taking: No sig reported   Troy Pier, MD Not Taking Unknown time Active Multiple Informants  traMADol (ULTRAM) 50 MG tablet [id number]  Take 1 tablet (50 mg total) by mouth every 8 (eight) hours as needed for up to 5 days. Make sure you are not on Naltrexon when taking this medication Troy Pier, MD  Active           Patient Active Problem List   Diagnosis Date Noted  . Influenza vaccine needed 04/10/2020  . Protein-calorie malnutrition, severe 04/01/2019  . 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  . History of stomach ulcers   . 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 Diabetes mellitus without complication ([place])   . Alcohol abuse   . Acute gastric ulcer with hemorrhage   . GI bleed 03/30/2019  . Abdominal pain, left lower quadrant   . Blood in the stool   . Alcoholic gastritis 58/52/7782  . Non-intractable vomiting 05/27/2018  . Hyperkalemia 05/27/2018  . Erectile dysfunction 02/09/2017  . Tobacco dependence 02/09/2017  . Hyperlipidemia 01/18/2017  . Gastroesophageal reflux disease with esophagitis   . Renal stone   . Type 2 diabetes mellitus without complication, without long-term current use of insulin ([place]) 08/16/2016  . Abdominal wall pain   . Coronary artery disease involving native coronary artery of native heart without angina pectoris   . Atypical chest pain   . Essential hypertension   . History of ST elevation myocardial infarction (STEMI) 07/06/2013  . History of small bowel obstruction 07/29/2008  . GLAUCOMA 04/15/2008    Conditions to be addressed/monitored per PCP order:   Transportation    Follow up:  Patient agrees to Care Plan and Follow-up.  Plan: The Managed Medicaid care management team will reach out to the patient again over the next 30 days.  Date/time of next scheduled Social Work care management/care coordination outreach:  07/02/20 at 11:45.  [name], BSW, [place]  High Risk Managed Medicaid Team

## 2020-06-01 NOTE — Patient Instructions (Signed)
Visit Information  Troy Mclaughlin was given information about Medicaid Managed Care team care coordination services as a part of their [place] Medicaid benefit. Troy Mclaughlin verbally consented to engagement with the [facility] Managed Care team.   For questions related to your [facility], please call: [phone] or visit the homepage here: [email or web address]  If you would like to schedule transportation through your [facility], please call the following number at least 2 days in advance of your appointment: [phone]  Goals Addressed   None       Social Worker will follow up in 30 days.   [name]

## 2020-06-02 ENCOUNTER — Ambulatory Visit: Payer: Self-pay

## 2020-06-04 ENCOUNTER — Ambulatory Visit (HOSPITAL_COMMUNITY): Payer: Medicaid Other

## 2020-06-04 ENCOUNTER — Encounter (HOSPITAL_COMMUNITY): Payer: Self-pay

## 2020-06-05 ENCOUNTER — Telehealth (INDEPENDENT_AMBULATORY_CARE_PROVIDER_SITE_OTHER): Payer: Self-pay

## 2020-06-05 NOTE — Telephone Encounter (Signed)
Copied from [place] [phone]. Topic: General - Other >> Jun 05, 2020 11:12 AM Yvette Rack wrote: Reason for CRM: Pt stated he was diagnosed with a hernia and he would like to ask Dr. Wynetta Emery if she could prescribe him a medication for pain. Pt stated Dr. Wynetta Emery is aware of his financial situation so he would need to come to the office to pick up the medication. Pt requests call back

## 2020-06-05 NOTE — Telephone Encounter (Signed)
Returned pt call. Made pt aware that Dr. Wynetta Emery sent in Tramadol back on 12/22. Pt states he forgot and he didn't have any money at the time. Pt states he will pick up rx when he get his check. Made pt aware that he can take Tylenol as needed for the pain

## 2020-06-08 ENCOUNTER — Ambulatory Visit: Payer: Medicaid Other | Attending: Internal Medicine | Admitting: Internal Medicine

## 2020-06-08 ENCOUNTER — Other Ambulatory Visit: Payer: Self-pay

## 2020-06-08 DIAGNOSIS — F17200 Nicotine dependence, unspecified, uncomplicated: Secondary | ICD-10-CM

## 2020-06-08 DIAGNOSIS — Z87898 Personal history of other specified conditions: Secondary | ICD-10-CM

## 2020-06-08 DIAGNOSIS — Z23 Encounter for immunization: Secondary | ICD-10-CM

## 2020-06-08 DIAGNOSIS — F1020 Alcohol dependence, uncomplicated: Secondary | ICD-10-CM | POA: Diagnosis not present

## 2020-06-08 DIAGNOSIS — I10 Essential (primary) hypertension: Secondary | ICD-10-CM

## 2020-06-08 DIAGNOSIS — Z87448 Personal history of other diseases of urinary system: Secondary | ICD-10-CM | POA: Diagnosis not present

## 2020-06-08 NOTE — Progress Notes (Signed)
Virtual Visit via Telephone Note This was done as telephone visit due to snow I connected with Troy Mclaughlin on 06/08/20 at 11:25 a.m by telephone and verified that I am speaking with the correct person using two identifiers.  Location: Patient: home Provider: office The patient, my CMA Ms. Troy Mclaughlin and myself participated in this visit. I discussed the limitations, risks, security and privacy concerns of performing an evaluation and management service by telephone and the availability of in person appointments. I also discussed with the patient that there may be a patient responsible charge related to this service. The patient expressed understanding and agreed to proceed.   History of Present Illness: Patient with history of HTN, diabetes,tob dep,renal stones, EtOH use disorder,GERD, TIA, nonobstructive CAD(75% mRCA 2015), low testosterone, ED. patient's last visit with me was 03/2020.  Hematuria: reports he still notice blood in urine intermittently.  Has appt with urologist 06/30/2020. Seen in ER 04/2020 with c/o RL abdominal pain, dysuria and hematuria.  UA was negative, CBC nl, chemistry normal, CT of the abdomen and pelvis revealed no acute findings.  RT inguinal hernia:  Saw the surgeon recently.  Nothing found.  Told he did not see anything on exam.  Told to see the urologist for possible ultrasound.  Patient still complains of pain in this area.  I had sent a prescription for tramadol to his pharmacy several weeks ago.  He never filled the prescription due to lack of finances.  Of note CT scan of abdomen and pelvis was negative.  HTN:  Unable to afford meds since last visit with me.  Also transportation is a major issue for him.  Denies CP/SOB.  Some dizziness if he gets up too quickly.  Tob dep:  Still smoking 1/4 a pk a day.  Has the patches but not using it consistently.  We had ordered CAT scan of the chest for lung cancer screening.  That is scheduled for later this  month.  ETOH use disorder: reports he has cut back from drinking quarts to a "couple cans" several times a wk.  HM: Reports having received 2 shots of COVID-19 vaccine so far.  He does not have his card handy to give me the dates. Outpatient Encounter Medications as of 06/08/2020  Medication Sig Note  . ACCU-CHEK FASTCLIX LANCETS MISC Use as directed once daily (Patient not taking: No sig reported)   . amLODipine (NORVASC) 5 MG tablet Take 1 tablet (5 mg total) by mouth daily. (Patient not taking: No sig reported)   . atorvastatin (LIPITOR) 10 MG tablet Take 1 tablet (10 mg total) by mouth daily. (Patient not taking: No sig reported)   . Blood Glucose Monitoring Suppl (ACCU-CHEK GUIDE) w/Device KIT 1 each by Does not apply route daily. (Patient not taking: No sig reported)   . carvedilol (COREG) 12.5 MG tablet Take 0.5 tablets (6.25 mg total) by mouth 2 (two) times daily with a meal. (Patient not taking: No sig reported)   . chlordiazePOXIDE (LIBRIUM) 25 MG capsule $RemoveBe'50mg'PQeCjQzaY$  PO TID x 1D, then 25-$RemoveBef'50mg'qHaiAdoAsG$  PO BID X 1D, then 25-$RemoveBef'50mg'GecjjBRRFm$  PO QD X 1D (Patient not taking: No sig reported)   . glucose blood (ACCU-CHEK GUIDE) test strip Use as instructed to test blood sugar once daily (Patient not taking: No sig reported)   . naltrexone (DEPADE) 50 MG tablet Take 1 tablet (50 mg total) by mouth daily. (Patient not taking: No sig reported)   . nicotine (NICODERM CQ - DOSED IN MG/24 HOURS) 14 mg/24hr  patch Place 1 patch (14 mg total) onto the skin daily. (Patient not taking: No sig reported)   . nitroGLYCERIN (NITROSTAT) 0.4 MG SL tablet Place 1 tablet (0.4 mg total) under the tongue every 5 (five) minutes as needed for chest pain. (Patient not taking: No sig reported) 07/09/2019: Pt ran out, needs refill  . pantoprazole (PROTONIX) 40 MG tablet Take 1 tablet (40 mg total) by mouth daily. (Patient not taking: No sig reported)   . tamsulosin (FLOMAX) 0.4 MG CAPS capsule Take 1 capsule (0.4 mg total) by mouth daily. (Patient  not taking: No sig reported)    No facility-administered encounter medications on file as of 06/08/2020.      Observations/Objective: Results for orders placed or performed during the hospital encounter of 05/10/20  Lipase, blood  Result Value Ref Range   Lipase 24 11 - 51 U/L  Comprehensive metabolic panel  Result Value Ref Range   Sodium 137 135 - 145 mmol/L   Potassium 3.9 3.5 - 5.1 mmol/L   Chloride 103 98 - 111 mmol/L   CO2 24 22 - 32 mmol/L   Glucose, Bld 90 70 - 99 mg/dL   BUN 11 8 - 23 mg/dL   Creatinine, Ser 0.78 0.61 - 1.24 mg/dL   Calcium 9.3 8.9 - 10.3 mg/dL   Total Protein 7.5 6.5 - 8.1 g/dL   Albumin 3.9 3.5 - 5.0 g/dL   AST 23 15 - 41 U/L   ALT 16 0 - 44 U/L   Alkaline Phosphatase 72 38 - 126 U/L   Total Bilirubin 0.5 0.3 - 1.2 mg/dL   GFR, Estimated >60 >60 mL/min   Anion gap 10 5 - 15  CBC  Result Value Ref Range   WBC 6.4 4.0 - 10.5 K/uL   RBC 5.34 4.22 - 5.81 MIL/uL   Hemoglobin 13.4 13.0 - 17.0 g/dL   HCT 40.0 39.0 - 52.0 %   MCV 74.9 (L) 80.0 - 100.0 fL   MCH 25.1 (L) 26.0 - 34.0 pg   MCHC 33.5 30.0 - 36.0 g/dL   RDW 14.3 11.5 - 15.5 %   Platelets 155 150 - 400 K/uL   nRBC 0.0 0.0 - 0.2 %  Urinalysis, Routine w reflex microscopic Urine, Clean Catch  Result Value Ref Range   Color, Urine YELLOW YELLOW   APPearance CLEAR CLEAR   Specific Gravity, Urine 1.021 1.005 - 1.030   pH 5.0 5.0 - 8.0   Glucose, UA NEGATIVE NEGATIVE mg/dL   Hgb urine dipstick NEGATIVE NEGATIVE   Bilirubin Urine NEGATIVE NEGATIVE   Ketones, ur NEGATIVE NEGATIVE mg/dL   Protein, ur NEGATIVE NEGATIVE mg/dL   Nitrite NEGATIVE NEGATIVE   Leukocytes,Ua NEGATIVE NEGATIVE     Assessment and Plan: 1. Essential hypertension Discussed health risks associated with uncontrolled blood pressure.  He will try to get his medicines later this week.  I told him that he still has refills on the prescription that I sent on his last visit with me in November.  2. Tobacco  dependence Encouraged to quit.  Commended him on cutting down.  Advised to use the nicotine patches consistently if he is serious about quitting.  Less than 5 minutes spent on counseling.  3. Alcohol use disorder, moderate, dependence ([place]) Commended him on cutting back.  Encouraged him to try to quit completely.  On last visit we had talked about him joining [employer] and I had our LCSW touch base with him to discuss resources in the community  for transportation.  However when she called patient told her that he utilizes Medicaid for transportation to medical appointments.  4. History of gross hematuria Keep upcoming appointment with urologist.   Follow Up Instructions: 3-4 mths   I discussed the assessment and treatment plan with the patient. The patient was provided an opportunity to ask questions and all were answered. The patient agreed with the plan and demonstrated an understanding of the instructions.   The patient was advised to call back or seek an in-person evaluation if the symptoms worsen or if the condition fails to improve as anticipated.  I provided 8 minutes of non-face-to-face time during this encounter.   Karle Plumber, MD

## 2020-06-08 NOTE — Progress Notes (Signed)
Pt states his pain is coming from where his hernia is at   Pt states he doesn't have any medications and is needing everything refilled

## 2020-06-10 ENCOUNTER — Other Ambulatory Visit: Payer: Self-pay

## 2020-06-10 NOTE — Patient Instructions (Signed)
Visit Information  Troy Mclaughlin was given information about Medicaid Managed Care team care coordination services as a part of their [place] Medicaid benefit. Troy Mclaughlin verbally consented to engagement with the [facility] Managed Care team.   For questions related to your [facility], please call: [phone] or visit the homepage here: [email or web address]  If you would like to schedule transportation through your [facility], please call the following number at least 2 days in advance of your appointment: [phone]  Troy Mclaughlin - following are the goals we discussed in your visit today:  Goals Addressed   None     Please see education materials related to HTN provided as print materials.   Patient verbalizes understanding of instructions provided today.   The Managed Medicaid care management team will reach out to the patient again over the next 14 days.   Troy Mclaughlin, [facility]  Following is a copy of your plan of care:  Patient Care Plan: General Plan of Care (Adult)    Problem Identified: Health Promotion or Disease Self-Management (General Plan of Care)     Long-Range Goal: Self-Management Plan Developed   Start Date: 04/24/2020  Expected End Date: 07/23/2020  Recent Progress: Not on track  Priority: High  Note:   Current Barriers:  Troy Mclaughlin Knowledge Deficits related to disease management. . Care Coordination needs related to disease management. . Chronic Disease Management support and education needs related to chronic disease conditions-DM, HTN. Troy Mclaughlin Update 05/05/20:  Patient states he does not have glucometer or BP cuff. . Film/video editor.  . Transportation barriers . Update 05/05/20:  Patient states he has used Medicaid transportation before and has contact information for [facility] transportation provided on 04/24/20. . Non-adherence to prescribed  medication regimen . Difficulty obtaining medications . Does not adhere to prescribed medication regimen  Nurse Case Manager Clinical Goal(s):  Troy Mclaughlin Over the next 7 days, patient will verbalize understanding of plan for obtaining medications and taking medications . Over the next 30 days, patient will attend all scheduled medical appointments: . Update 05/13/20: Patient has appointment 05/19/20 at [facility], patient reminded of appointment day and time.  Patient has transportation information. . Over the next 14 days, patient will demonstrate improved adherence to prescribed treatment plan for disease management as evidenced by taking prescribed medications. . Over the next 14  days, patient will demonstrate improved health management independence as evidenced by taking prescribed medications. . Over the next 30 days, patient will verbalize basic understanding of disease process and self health management plan. . Over the next 30 days, the patient will demonstrate ongoing self health care management ability.  Interventions:  . Inter-disciplinary care team collaboration (see longitudinal plan of care) . Evaluation of current treatment plan  and patient's adherence to plan as established by provider. . Advised patient to contact provider as needed. . Reviewed medications with patient  . Collaborated with care guide regarding transportation and medications. . Discussed plans with patient for ongoing care management follow up and provided patient with direct contact information for care management team . Advised patient, providing education and rationale, to monitor blood pressure daily and record, calling provider for findings outside established parameters.  . Provided patient and spouse  with information about transportation. . Reviewed scheduled/upcoming provider appointments. . Advised patient, providing education and rationale, to check cbg and record, calling provider  for  findings outside established parameters.   . Care Guide referral for  transportation and medication needs. Troy Mclaughlin Update 05/05/20:  BSW attempted to call patient 05/04/20 to discuss needs with no success-rescheduled.  Patient Goals/Self-Care Activities Over the next 7 days, patient will:  -Self administers medications as prescribed Attends all scheduled provider appointments Calls pharmacy for medication refills Calls provider office for new concerns or questions  Follow Up Plan: [place] will follow up with patient within 7-14 days and follow up with PCP regarding glucometer and BP cuff for patient. Care Guide will contact patient regarding transportation resources and medication needs. The patient has been provided with contact information for the Managed Medicaid care management team and has been advised to call with any health related questions or concerns.      Task: Mutually Develop and Royce Macadamia Achievement of Patient Goals   Note:   Care Management Activities:    - verbalization of feelings encouraged    Notes:

## 2020-06-10 NOTE — Patient Outreach (Signed)
Medicaid Managed Care    Pharmacy Note  06/10/2020 Name: Troy Mclaughlin MRN: [id number] DOB: 1955/10/03  Troy Mclaughlin is a 65 y.o. year old male who is a primary care patient of [name], MD. The [facility] Managed Care Coordination team was consulted for assistance with disease management and care coordination needs.    Engaged with patient Engaged with patient by telephone for initial visit in response to referral for case management and/or care coordination services.  Troy Mclaughlin was given information about Managed Medicaid Care Coordination team services today. Troy Mclaughlin agreed to services and verbal consent obtained.   Objective:  Lab Results  Component Value Date   CREATININE 0.78 05/10/2020   CREATININE 0.88 03/25/2020   CREATININE 0.84 01/03/2020    Lab Results  Component Value Date   HGBA1C 5.1 04/10/2020       Component Value Date/Time   CHOL 131 10/17/2014 0828   TRIG 52.0 10/17/2014 0828   HDL 40.80 10/17/2014 0828   CHOLHDL 3 10/17/2014 0828   VLDL 10.4 10/17/2014 0828   LDLCALC 80 10/17/2014 0828    Other: (TSH, CBC, Vit D, etc.)  Clinical ASCVD: No  The ASCVD Risk score Troy Bussing DC Jr., et al., 2013) failed to calculate for the following reasons:   Cannot find a previous HDL lab   Cannot find a previous total cholesterol lab    Other: (CHADS2VASc if Afib, PHQ9 if depression, MMRC or CAT for COPD, ACT, DEXA)  BP Readings from Last 3 Encounters:  05/10/20 (!) 181/98  04/10/20 (!) 172/114  03/25/20 (!) 172/103    Assessment/Interventions: Review of patient past medical history, allergies, medications, health status, including review of consultants reports, laboratory and other test data, was performed as part of comprehensive evaluation and provision of chronic care management services.   HTN BP Readings from Last 3 Encounters:  05/10/20 (!) 181/98  04/10/20 (!) 172/114  03/25/20 (!) 172/103   Amlodipine Carvedilol 12.67m Plan:   Priority was access to medications. Non-compliant due to cost  Cholesterol Lab Results  Component Value Date   CHOL 131 10/17/2014   CHOL 141 05/18/2014   CHOL 137 01/08/2014   Lab Results  Component Value Date   HDL 40.80 10/17/2014   HDL 39 (L) 05/18/2014   HDL 51.80 01/08/2014   Lab Results  Component Value Date   LDLCALC 80 10/17/2014   LDLCALC 93 05/18/2014   LDLCALC 69 01/08/2014   Lab Results  Component Value Date   TRIG 52.0 10/17/2014   TRIG 45 05/18/2014   TRIG 80.0 01/08/2014   Lab Results  Component Value Date   CHOLHDL 3 10/17/2014   CHOLHDL 3.6 05/18/2014   CHOLHDL 3 01/08/2014   No results found for: LDLDIRECT  Atorvastatin 156mPlan: Priority was access to medications, will try to increase statin at future visit  DM Lab Results  Component Value Date   HGBA1C 5.1 04/10/2020   HGBA1C 5.6 03/30/2019   HGBA1C 5.8 03/22/2018   Lab Results  Component Value Date   LDLCALC 80 10/17/2014   CREATININE 0.78 05/10/2020    Lab Results  Component Value Date   NA 137 05/10/2020   K 3.9 05/10/2020   CREATININE 0.78 05/10/2020   GFRNONAA >60 05/10/2020   GFRAA >60 01/03/2020   GLUCOSE 90 05/10/2020   Plan: Patient stated, "I have Diabetes." I explained to him the results of his labs and how he doesn't anymore though he'll probably always still have to be  cognizant of sugar intake  Meds -Patient unable to afford medications. Hasn't taken pills in 3 months. Hasn't picked up meds since Feb 2021 per Pharmacy Plan: -Patient has no transportation and unable to afford meds. Will deliver on 2nd of the month since he's paid on the 1st Verbal consent obtained for [facility] enhanced pharmacy services (medication synchronization, adherence packaging, delivery coordination). A medication sync plan was created to allow patient to get all medications delivered once every 30 to 90 days per patient preference. Patient understands they have freedom to choose  pharmacy and clinical pharmacist will coordinate care between all prescribers and [facility].    SDOH (Social Determinants of Health) assessments and interventions performed:    Care Plan  Allergies  Allergen Reactions  . Lisinopril Anaphylaxis and Swelling    angioedema  . Nsaids Other (See Comments)    Bleeding ulcers    Medications Reviewed Today    Reviewed by [name], RMA (Registered Medical Assistant) on 06/08/20 at 1047  Med List Status: <None>  Medication Order Taking? Sig Documenting Provider Last Dose Status Informant  ACCU-CHEK FASTCLIX LANCETS [place] [id number] No Use as directed once daily  Patient not taking: No sig reported   [name], MD Not Taking Active Multiple Informants  amLODipine (NORVASC) 5 MG tablet [id number] No Take 1 tablet (5 mg total) by mouth daily.  Patient not taking: No sig reported   [name], MD Not Taking Unknown time Active Multiple Informants  atorvastatin (LIPITOR) 10 MG tablet [id number] No Take 1 tablet (10 mg total) by mouth daily.  Patient not taking: No sig reported   [name], MD Not Taking Unknown time Active Multiple Informants  Blood Glucose Monitoring Suppl (ACCU-CHEK GUIDE) w/Device KIT [id number] No 1 each by Does not apply route daily.  Patient not taking: No sig reported   [name], MD Not Taking Active Multiple Informants  carvedilol (COREG) 12.5 MG tablet [id number] No Take 0.5 tablets (6.25 mg total) by mouth 2 (two) times daily with a meal.  Patient not taking: No sig reported   [name], MD Not Taking Unknown time Active Multiple Informants  chlordiazePOXIDE (LIBRIUM) 25 MG capsule [id number] No 68m PO TID x 1D, then 25-546mPO BID X 1D, then 25-5053mO QD X 1D  Patient not taking: No sig reported   GolSherwood GamblerD Not Taking Unknown time Active Multiple Informants  glucose blood (ACCU-CHEK GUIDE) test strip [id number] Use as instructed to test blood sugar  once daily  Patient not taking: No sig reported   JohLadell PierD Not Taking Active Multiple Informants  naltrexone (DEPADE) 50 MG tablet [id number] Take 1 tablet (50 mg total) by mouth daily.  Patient not taking: No sig reported   JohLadell PierD Not Taking Unknown time Active Multiple Informants  nicotine (NICODERM CQ - DOSED IN MG/24 HOURS) 14 mg/24hr patch [id number] Place 1 patch (14 mg total) onto the skin daily.  Patient not taking: No sig reported   JohLadell PierD Not Taking Unknown time Active Multiple Informants  nitroGLYCERIN (NITROSTAT) 0.4 MG SL tablet [id number] Place 1 tablet (0.4 mg total) under the tongue every 5 (five) minutes as needed for chest pain.  Patient not taking: No sig reported   JohLadell PierD Not Taking Unknown time Active Multiple Informants           Med Note (SUTPHIN, CHASIE F   Tue  Jul 09, 2019 10:44 AM) Pt ran out, needs refill  pantoprazole (PROTONIX) 40 MG tablet [id number] No Take 1 tablet (40 mg total) by mouth daily.  Patient not taking: No sig reported   Sherwood Gambler, MD Not Taking Unknown time Active Multiple Informants  tamsulosin (FLOMAX) 0.4 MG CAPS capsule [id number] No Take 1 capsule (0.4 mg total) by mouth daily.  Patient not taking: No sig reported   [name], MD Not Taking Unknown time Active Multiple Informants          Patient Active Problem List   Diagnosis Date Noted  . Influenza vaccine needed 04/10/2020  . Protein-calorie malnutrition, severe 04/01/2019  . 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  . History of stomach ulcers   . 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 Diabetes mellitus without complication ([place])   . Alcohol abuse   . Acute gastric ulcer with hemorrhage   . GI bleed 03/30/2019  . Abdominal pain, left lower quadrant   . Blood in the stool   . Alcoholic gastritis 22/63/3354  . Non-intractable vomiting 05/27/2018  . Hyperkalemia 05/27/2018  .  Erectile dysfunction 02/09/2017  . Tobacco dependence 02/09/2017  . Hyperlipidemia 01/18/2017  . Gastroesophageal reflux disease with esophagitis   . Renal stone   . Type 2 diabetes mellitus without complication, without long-term current use of insulin ([place]) 08/16/2016  . Abdominal wall pain   . Coronary artery disease involving native coronary artery of native heart without angina pectoris   . Atypical chest pain   . Essential hypertension   . History of ST elevation myocardial infarction (STEMI) 07/06/2013  . History of small bowel obstruction 07/29/2008  . GLAUCOMA 04/15/2008    Conditions to be addressed/monitored: HTN, HLD and DM  Care Plan : Manage Medicines  Updates made by Lane Hacker, [place] since 06/10/2020 12:00 AM    Problem: Health Promotion or Disease Self-Management (General Plan of Care)     Goal: Medication Management   Note:   Current Barriers:  . Unable to independently afford treatment regimen . Does not adhere to prescribed medication regimen . Does not maintain contact with provider office . Does not contact provider office for questions/concerns . No Transportation and is unable to get meds from Pharmacy .   Pharmacist Clinical Goal(s):  [name] Over the next 14 days, patient will get meds delivered on 2nd of the month through collaboration with PharmD and provider (Patient paid on 1st) .   Interventions: . Inter-disciplinary care team collaboration (see longitudinal plan of care) . Comprehensive medication review performed; medication list updated in electronic medical record  _0 @ _1 @ _2 @  Patient Goals/Self-Care Activities . Over the next 14 days, patient will:  - collaborate with provider on medication access solutions  Follow Up Plan: The care management team will reach out to the patient again over the next 14 days.     Task: Mutually Develop and Royce Macadamia Achievement of Patient Goals   Note:   Care  Management Activities:    - verbalization of feelings encouraged    Notes:       Medication Assistance: Patient paid on 1st of every month. We'll deliver meds on 2nd  Follow up: Agree   Plan: The care management team will reach out to the patient again over the next 14 days.   [place] Con[place], Pharm.D., Managed Medicaid Pharmacist - [phone]

## 2020-06-17 ENCOUNTER — Other Ambulatory Visit: Payer: Self-pay

## 2020-06-17 NOTE — Patient Outreach (Signed)
Medication coordination for Troy Mclaughlin, delivery scheduled for 06/24/20. Created f/u appt for next month 30 days from now

## 2020-06-18 ENCOUNTER — Other Ambulatory Visit: Payer: Self-pay

## 2020-06-18 ENCOUNTER — Encounter (HOSPITAL_COMMUNITY): Payer: Self-pay

## 2020-06-18 ENCOUNTER — Ambulatory Visit (HOSPITAL_COMMUNITY)
Admission: RE | Admit: 2020-06-18 | Discharge: 2020-06-18 | Disposition: A | Discharge status: Home / Self Care | Payer: Medicaid Other | Source: Ambulatory Visit | Attending: Surgery | Admitting: Surgery

## 2020-06-18 ENCOUNTER — Ambulatory Visit (HOSPITAL_COMMUNITY)
Admission: RE | Admit: 2020-06-18 | Discharge: 2020-06-18 | Disposition: A | Discharge status: Home / Self Care | Payer: Medicaid Other | Source: Ambulatory Visit | Attending: Internal Medicine | Admitting: Internal Medicine

## 2020-06-18 DIAGNOSIS — F17200 Nicotine dependence, unspecified, uncomplicated: Secondary | ICD-10-CM | POA: Diagnosis present

## 2020-06-18 DIAGNOSIS — Z122 Encounter for screening for malignant neoplasm of respiratory organs: Secondary | ICD-10-CM | POA: Insufficient documentation

## 2020-06-18 DIAGNOSIS — R1031 Right lower quadrant pain: Secondary | ICD-10-CM

## 2020-06-18 DIAGNOSIS — R1030 Lower abdominal pain, unspecified: Secondary | ICD-10-CM | POA: Diagnosis not present

## 2020-06-20 NOTE — Progress Notes (Signed)
Let patient know that the CAT scan of his chest showed a very small nodule/spot in the right upper lung.  We will need to repeat CAT scan in 1 year to make sure that this remains stable in size.  If it grows we would need to refer him for further evaluation to make sure that it is not a cancerous lesion. The scan also showed that he has significant heart disease.  It is important that he takes his medications including atorvastatin for cholesterol, carvedilol which is one of his heart medications and a baby aspirin every day.  Refills on these medications are at his pharmacy.

## 2020-06-22 ENCOUNTER — Telehealth: Payer: Self-pay

## 2020-06-22 DIAGNOSIS — I25118 Atherosclerotic heart disease of native coronary artery with other forms of angina pectoris: Secondary | ICD-10-CM

## 2020-06-22 MED ORDER — NITROGLYCERIN 0.4 MG SL SUBL: 0.4000 mg | SUBLINGUAL_TABLET | SUBLINGUAL | 1 refills | Status: DC | PRN

## 2020-06-22 NOTE — Telephone Encounter (Signed)
Contacted pt to go over CT results pt is aware and doesn't have any questions or concerns  Pt requested to have Nitrostat [facility] [address]. Rx has been sent

## 2020-06-23 ENCOUNTER — Telehealth: Payer: Self-pay | Admitting: Internal Medicine

## 2020-06-23 NOTE — Telephone Encounter (Signed)
Copied from [place] [phone]. Topic: Quick Communication - See Telephone Encounter >> Jun 23, 2020 12:40 PM Loma [place] wrote: CRM for notification. See Telephone encounter for: 06/23/20.plscall back Ovid Curd at [employer]. He is wanting refills for pt. [facility] is not on pt list, and he says that it should be. I hhave looked at pt current meds and see that they are all my 2 local [place]. Pls advise Call Ovid Curd at [phone]. He had all med slated for pick up today.

## 2020-06-23 NOTE — Telephone Encounter (Signed)
Returned [employer] call lvm

## 2020-06-25 ENCOUNTER — Other Ambulatory Visit: Payer: Self-pay | Admitting: Internal Medicine

## 2020-06-25 ENCOUNTER — Telehealth: Payer: Self-pay | Admitting: Internal Medicine

## 2020-06-25 DIAGNOSIS — I25118 Atherosclerotic heart disease of native coronary artery with other forms of angina pectoris: Secondary | ICD-10-CM

## 2020-06-25 MED ORDER — NITROGLYCERIN 0.4 MG SL SUBL: 0.4000 mg | SUBLINGUAL_TABLET | SUBLINGUAL | 1 refills | Status: DC | PRN

## 2020-06-25 NOTE — Telephone Encounter (Signed)
Copied from [place] [phone]. Topic: General - Other >> Jun 25, 2020 10:47 AM Tessa Lerner A wrote: Reason for CRM: Patient would like to be contacted by PCP regarding medications. Patient was recently seen at [facility] on 06/22/20 and provided additional prescriptions but unable to pick them up at the pharmacy  Agent was unable to see any additional prescriptions, aside from one,  in the patient's chart at the time of call

## 2020-06-25 NOTE — Telephone Encounter (Signed)
Returned pt call and made aware that rxs were sent [facility] [address]. Pt states he went there and they only had the nitrostat ready for him. Contacted the pharmacy and per the pharmacist rxs were transferred out to [facility]. I asked the pharmacist who transferred the rxs and per the pharmacist a pharmacist from [facility] transferred them.... [place] and spoke with Lauren and asked if she has rxs for pt there and per Lauren she does. I asked if rxs can be transferred back to [facility] and per Lauren she will transfer rxs back. Contacted pt and made aware of the situation and made pt aware that rxs has been transferred back to [facility]. Pt doesn't have any questions or concerns

## 2020-06-25 NOTE — Telephone Encounter (Signed)
Medication: nitroGLYCERIN (NITROSTAT) 0.4 MG SL tablet [[id number]] , traMADol (ULTRAM) 50 MG tablet [[id number]]  DISCONTINUED Ovid Curd, Pharmacist at [facility] is wanting to know is the patient still taking this medication? )  Has the patient contacted their pharmacy? YES  (Agent: If no, request that the patient contact the pharmacy for the refill.) (Agent: If yes, when and what did the pharmacy advise?)  Preferred Pharmacy (with phone number or street name): [facility] [address]een Mans [place], [place] [address] [phone]  Agent: Please be advised that RX refills may take up to 3 business days. We ask that you follow-up with your pharmacy.

## 2020-06-26 ENCOUNTER — Other Ambulatory Visit: Payer: Self-pay | Admitting: Internal Medicine

## 2020-06-26 ENCOUNTER — Other Ambulatory Visit: Payer: Self-pay

## 2020-06-26 NOTE — Telephone Encounter (Signed)
Requested medication (s) are due for refill today: yes  Requested medication (s) are on the active medication list: yes  Last refill:  05/13/2020  Future visit scheduled: yes  Notes to clinic:  this refill cannot be delegated   Requested Prescriptions  Pending Prescriptions Disp Refills   traMADol (ULTRAM) 50 MG tablet [Pharmacy Med Name: TRAMADOL 50MG  TABLETS] 15 tablet     Sig: TAKE 1 TABLET(50 MG) BY MOUTH EVERY 8 HOURS FOR UP TO 5 DAYS AS NEEDED. MAKE SURE YOU ARE NOT ON NALTREXON WHEN TAKING THIS MEDICATION      Not Delegated - Analgesics:  Opioid Agonists Failed - 06/26/2020 10:10 AM      Failed - This refill cannot be delegated      Passed - Urine Drug Screen completed in last 360 days      Passed - Valid encounter within last 6 months    Recent Outpatient Visits           2 weeks ago Essential hypertension   [place], Neoma Laming B, MD   2 months ago Type 2 diabetes mellitus without complication, without long-term current use of insulin ([place])   [place], Deborah B, MD   1 year ago Constipation, unspecified constipation type   [place], [place], [place]   1 year ago Constipation, unspecified constipation type   [place]   1 year ago Coronary 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 [facility] [place])   [place], Deborah B, MD       Future Appointments             In 3 months Wynetta Emery, Dalbert Batman, MD [place]

## 2020-06-26 NOTE — Telephone Encounter (Signed)
Requested medication (s) are due for refill today: yes  Requested medication (s) are on the active medication list: no  Last refill:  05/13/20 #15  Future visit scheduled: yes  Notes to clinic:  Please review for refill. Refill not delegated per protocol    Requested Prescriptions  Pending Prescriptions Disp Refills   traMADol (ULTRAM) 50 MG tablet [Pharmacy Med Name: TRAMADOL 50MG  TABLETS] 15 tablet     Sig: TAKE 1 TABLET(50 MG) BY MOUTH EVERY 8 HOURS FOR UP TO 5 DAYS AS NEEDED. MAKE SURE YOU ARE NOT ON NALTREXON WHEN TAKING THIS MEDICATION      Not Delegated - Analgesics:  Opioid Agonists Failed - 06/26/2020  3:45 PM      Failed - This refill cannot be delegated      Passed - Urine Drug Screen completed in last 360 days      Passed - Valid encounter within last 6 months    Recent Outpatient Visits           2 weeks ago Essential hypertension   [place], Neoma Laming B, MD   2 months ago Type 2 diabetes mellitus without complication, without long-term current use of insulin ([place])   [place], Deborah B, MD   1 year ago Constipation, unspecified constipation type   [place], [place], [place]   1 year ago Constipation, unspecified constipation type   [place]   1 year ago Coronary 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 [facility])   [place], Deborah B, MD       Future Appointments             In 3 months Wynetta Emery, Dalbert Batman, MD [place]

## 2020-06-26 NOTE — Patient Outreach (Signed)
Called patient yesterday and he said he didn't have Tramadol delivered. His speech was very slurred and didn't make any sense. Told him I'd look into it and call him back today. Did verify identity twice  After calling today, patient didn't remember speaking with me yesterday and his speech was much slower and normal than previous. Told him that Nitro was sent to [facility] but he stated he didn't need it, got it from somewhere else.  Will f/u with scheduled refills as neede

## 2020-06-26 NOTE — Telephone Encounter (Signed)
PT called stating his prescription is not at [place], [place] [address]. I contacted [facility] and spoke with the pharmacy who stated they do not have anything [facility] file [facility]. PT [facility] all [facility], PT stated he wants a follow up call from Dr. Wynetta Emery. Please advise.

## 2020-06-30 ENCOUNTER — Other Ambulatory Visit: Payer: Self-pay

## 2020-06-30 DIAGNOSIS — F17200 Nicotine dependence, unspecified, uncomplicated: Secondary | ICD-10-CM

## 2020-06-30 DIAGNOSIS — I2510 Atherosclerotic heart disease of native coronary artery without angina pectoris: Secondary | ICD-10-CM

## 2020-06-30 DIAGNOSIS — I10 Essential (primary) hypertension: Secondary | ICD-10-CM

## 2020-06-30 DIAGNOSIS — N401 Enlarged prostate with lower urinary tract symptoms: Secondary | ICD-10-CM

## 2020-06-30 MED ORDER — CARVEDILOL 12.5 MG PO TABS: 6.2500 mg | ORAL_TABLET | Freq: Two times a day (BID) | ORAL | 6 refills | Status: DC

## 2020-06-30 MED ORDER — TAMSULOSIN HCL 0.4 MG PO CAPS: 0.4000 mg | ORAL_CAPSULE | Freq: Every day | ORAL | 3 refills | Status: DC

## 2020-06-30 MED ORDER — AMLODIPINE BESYLATE 5 MG PO TABS: 5.0000 mg | ORAL_TABLET | Freq: Every day | ORAL | 6 refills | Status: DC

## 2020-06-30 MED ORDER — ATORVASTATIN CALCIUM 10 MG PO TABS: 10.0000 mg | ORAL_TABLET | Freq: Every day | ORAL | 3 refills | Status: DC

## 2020-06-30 NOTE — Telephone Encounter (Signed)
Contacted pt and asked pt has he received his medications pt states he hasn't made pt aware that [facility] and he will need to go pick them up. Pt states he will go pick them up when he get some money.

## 2020-07-01 ENCOUNTER — Other Ambulatory Visit: Payer: Self-pay

## 2020-07-01 NOTE — Patient Outreach (Signed)
Care Coordination  07/01/2020  [name] 06-17-1955 [id number]   Medicaid Managed Care   Unsuccessful Outreach Note  07/01/2020 Name: Troy Mclaughlin MRN: [id number] DOB: Aug 16, 1955  Referred by: [name], MD Reason for referral : High Risk Managed Medicaid (HR MM Unsuccessful Outreach)   An unsuccessful telephone outreach was attempted today. The patient was referred to the case management team for assistance with care management and care coordination.   Follow Up Plan: The patient has been provided with contact information for the care management team and has been advised to call with any health related questions or concerns.   [name], BSW, [place]  High Risk Managed Medicaid Team

## 2020-07-01 NOTE — Patient Instructions (Signed)
Visit Information  [name]  - as a part of your Medicaid benefit, you are eligible for care management and care coordination services at no cost or copay. I was unable to reach you by phone today but would be happy to help you with your health related needs. Please feel free to call me @ [phone].     [name], BSW, [place]  High Risk Managed Medicaid Team

## 2020-07-02 ENCOUNTER — Ambulatory Visit: Payer: Self-pay

## 2020-07-10 NOTE — Patient Outreach (Signed)
Called patient on 07/10/20 to prepare for refills for next month. Patient asked, "I thought you were supposed to deliver them a few weeks ago."  I reminded him of the conversation we had and he stated he didn't remember.  Would like meds delivered on 07/22/20 from [facility]. Tramadol is at [facility] still, will ask for transfer from them. F/U 1 month

## 2020-07-17 ENCOUNTER — Other Ambulatory Visit: Payer: Self-pay

## 2020-07-20 ENCOUNTER — Telehealth: Payer: Self-pay | Admitting: Internal Medicine

## 2020-07-20 MED ORDER — TRAMADOL HCL 50 MG PO TABS: 50.0000 mg | ORAL_TABLET | Freq: Every day | ORAL | 0 refills | Status: DC | PRN

## 2020-07-20 NOTE — Telephone Encounter (Signed)
Ovid Curd with [facility] [employer] is calling the pt has not [facility]. Ovid Curd would like to know if dr Wynetta Emery would send new rx [facility] to [facility] [address]  phone number [phone] .This pharm will delivery to patient.

## 2020-08-07 ENCOUNTER — Emergency Department (HOSPITAL_COMMUNITY): Payer: Medicaid Other

## 2020-08-07 ENCOUNTER — Emergency Department (HOSPITAL_COMMUNITY)
Admission: EM | Admit: 2020-08-07 | Discharge: 2020-08-08 | Disposition: A | Discharge status: Home / Self Care | Payer: Medicaid Other | Attending: Emergency Medicine | Admitting: Emergency Medicine

## 2020-08-07 ENCOUNTER — Other Ambulatory Visit: Payer: Self-pay

## 2020-08-07 DIAGNOSIS — Y907 Blood alcohol level of 200-239 mg/100 ml: Secondary | ICD-10-CM | POA: Insufficient documentation

## 2020-08-07 DIAGNOSIS — S0012XA Contusion of left eyelid and periocular area, initial encounter: Secondary | ICD-10-CM | POA: Insufficient documentation

## 2020-08-07 DIAGNOSIS — T887XXA Unspecified adverse effect of drug or medicament, initial encounter: Secondary | ICD-10-CM | POA: Diagnosis not present

## 2020-08-07 DIAGNOSIS — R519 Headache, unspecified: Secondary | ICD-10-CM | POA: Diagnosis not present

## 2020-08-07 DIAGNOSIS — F10920 Alcohol use, unspecified with intoxication, uncomplicated: Secondary | ICD-10-CM | POA: Insufficient documentation

## 2020-08-07 DIAGNOSIS — I10 Essential (primary) hypertension: Secondary | ICD-10-CM | POA: Diagnosis not present

## 2020-08-07 DIAGNOSIS — Z859 Personal history of malignant neoplasm, unspecified: Secondary | ICD-10-CM | POA: Diagnosis not present

## 2020-08-07 DIAGNOSIS — R4020 Unspecified coma: Secondary | ICD-10-CM | POA: Diagnosis not present

## 2020-08-07 DIAGNOSIS — I2510 Atherosclerotic heart disease of native coronary artery without angina pectoris: Secondary | ICD-10-CM | POA: Insufficient documentation

## 2020-08-07 DIAGNOSIS — W228XXA Striking against or struck by other objects, initial encounter: Secondary | ICD-10-CM | POA: Diagnosis not present

## 2020-08-07 DIAGNOSIS — Z79899 Other long term (current) drug therapy: Secondary | ICD-10-CM | POA: Insufficient documentation

## 2020-08-07 DIAGNOSIS — R319 Hematuria, unspecified: Secondary | ICD-10-CM | POA: Diagnosis not present

## 2020-08-07 DIAGNOSIS — E119 Type 2 diabetes mellitus without complications: Secondary | ICD-10-CM | POA: Insufficient documentation

## 2020-08-07 DIAGNOSIS — Z20822 Contact with and (suspected) exposure to covid-19: Secondary | ICD-10-CM | POA: Diagnosis not present

## 2020-08-07 DIAGNOSIS — R4182 Altered mental status, unspecified: Secondary | ICD-10-CM | POA: Diagnosis not present

## 2020-08-07 DIAGNOSIS — T50902A Poisoning by unspecified drugs, medicaments and biological substances, intentional self-harm, initial encounter: Secondary | ICD-10-CM

## 2020-08-07 DIAGNOSIS — R441 Visual hallucinations: Secondary | ICD-10-CM | POA: Insufficient documentation

## 2020-08-07 DIAGNOSIS — T50901A Poisoning by unspecified drugs, medicaments and biological substances, accidental (unintentional), initial encounter: Secondary | ICD-10-CM | POA: Insufficient documentation

## 2020-08-07 DIAGNOSIS — F17210 Nicotine dependence, cigarettes, uncomplicated: Secondary | ICD-10-CM | POA: Insufficient documentation

## 2020-08-07 DIAGNOSIS — T50904A Poisoning by unspecified drugs, medicaments and biological substances, undetermined, initial encounter: Secondary | ICD-10-CM | POA: Diagnosis not present

## 2020-08-07 DIAGNOSIS — G9389 Other specified disorders of brain: Secondary | ICD-10-CM | POA: Diagnosis not present

## 2020-08-07 LAB — COMPREHENSIVE METABOLIC PANEL
ALT: 12 U/L (ref 0–44)
AST: 17 U/L (ref 15–41)
Albumin: 3.6 g/dL (ref 3.5–5.0)
Alkaline Phosphatase: 69 U/L (ref 38–126)
Anion gap: 6 (ref 5–15)
BUN: 5 mg/dL — ABNORMAL LOW (ref 8–23)
CO2: 28 mmol/L (ref 22–32)
Calcium: 9.2 mg/dL (ref 8.9–10.3)
Chloride: 101 mmol/L (ref 98–111)
Creatinine, Ser: 0.88 mg/dL (ref 0.61–1.24)
GFR, Estimated: >60 mL/min (ref >60)
Glucose, Bld: 89 mg/dL (ref 70–99)
Potassium: 3.7 mmol/L (ref 3.5–5.1)
Sodium: 135 mmol/L (ref 135–145)
Total Bilirubin: 0.5 mg/dL (ref 0.3–1.2)
Total Protein: 7 g/dL (ref 6.5–8.1)

## 2020-08-07 LAB — CBC WITH DIFFERENTIAL/PLATELET
Abs Immature Granulocytes: 0 10*3/uL (ref 0.00–0.07)
Basophils Absolute: 0 10*3/uL (ref 0.0–0.1)
Basophils Relative: 1 %
Eosinophils Absolute: 0.1 10*3/uL (ref 0.0–0.5)
Eosinophils Relative: 3 %
HCT: 40.7 % (ref 39.0–52.0)
Hemoglobin: 13.4 g/dL (ref 13.0–17.0)
Immature Granulocytes: 0 %
Lymphocytes Relative: 44 %
Lymphs Abs: 2.1 10*3/uL (ref 0.7–4.0)
MCH: 25 pg — ABNORMAL LOW (ref 26.0–34.0)
MCHC: 32.9 g/dL (ref 30.0–36.0)
MCV: 75.8 fL — ABNORMAL LOW (ref 80.0–100.0)
Monocytes Absolute: 0.4 10*3/uL (ref 0.1–1.0)
Monocytes Relative: 8 %
Neutro Abs: 2.1 10*3/uL (ref 1.7–7.7)
Neutrophils Relative %: 44 %
Platelets: 276 10*3/uL (ref 150–400)
RBC: 5.37 MIL/uL (ref 4.22–5.81)
RDW: 14.5 % (ref 11.5–15.5)
WBC: 4.7 10*3/uL (ref 4.0–10.5)
nRBC: 0 % (ref 0.0–0.2)

## 2020-08-07 LAB — URINALYSIS, ROUTINE W REFLEX MICROSCOPIC
Bilirubin Urine: NEGATIVE
Glucose, UA: NEGATIVE mg/dL
Hgb urine dipstick: NEGATIVE
Ketones, ur: NEGATIVE mg/dL
Leukocytes,Ua: NEGATIVE
Nitrite: NEGATIVE
Protein, ur: NEGATIVE mg/dL
Specific Gravity, Urine: <1.005 — ABNORMAL LOW (ref 1.005–1.030)
pH: 6 (ref 5.0–8.0)

## 2020-08-07 LAB — ETHANOL: Alcohol, Ethyl (B): 231 mg/dL — ABNORMAL HIGH (ref <10)

## 2020-08-07 LAB — RESP PANEL BY RT-PCR (FLU A&B, COVID) ARPGX2
Influenza A by PCR: NEGATIVE
Influenza B by PCR: NEGATIVE
SARS Coronavirus 2 by RT PCR: NEGATIVE

## 2020-08-07 LAB — RAPID URINE DRUG SCREEN, HOSP PERFORMED
Amphetamines: NOT DETECTED
Barbiturates: NOT DETECTED
Benzodiazepines: NOT DETECTED
Cocaine: NOT DETECTED
Opiates: NOT DETECTED
Tetrahydrocannabinol: NOT DETECTED

## 2020-08-07 LAB — PHOSPHORUS: Phosphorus: 3.7 mg/dL (ref 2.5–4.6)

## 2020-08-07 LAB — ACETAMINOPHEN LEVEL: Acetaminophen (Tylenol), Serum: <10 ug/mL — ABNORMAL LOW (ref 10–30)

## 2020-08-07 LAB — LACTIC ACID, PLASMA: Lactic Acid, Venous: 0.9 mmol/L (ref 0.5–1.9)

## 2020-08-07 LAB — AMMONIA: Ammonia: 17 umol/L (ref 9–35)

## 2020-08-07 LAB — TROPONIN I (HIGH SENSITIVITY)
Troponin I (High Sensitivity): 4 ng/L (ref <18)
Troponin I (High Sensitivity): 7 ng/L (ref <18)

## 2020-08-07 LAB — TSH: TSH: 0.326 u[IU]/mL — ABNORMAL LOW (ref 0.350–4.500)

## 2020-08-07 LAB — SALICYLATE LEVEL: Salicylate Lvl: <7 mg/dL — ABNORMAL LOW (ref 7.0–30.0)

## 2020-08-07 LAB — PROTIME-INR
INR: 0.9 (ref 0.8–1.2)
Prothrombin Time: 12.1 seconds (ref 11.4–15.2)

## 2020-08-07 LAB — MAGNESIUM: Magnesium: 1.9 mg/dL (ref 1.7–2.4)

## 2020-08-07 LAB — BRAIN NATRIURETIC PEPTIDE: B Natriuretic Peptide: 33.8 pg/mL (ref 0.0–100.0)

## 2020-08-07 LAB — LIPASE, BLOOD: Lipase: 36 U/L (ref 11–51)

## 2020-08-07 MED ORDER — LORAZEPAM 2 MG/ML IJ SOLN
1.0000 mg | Freq: Once | INTRAMUSCULAR | Status: AC
  Administered 2020-08-07: 1 mg via INTRAVENOUS
  Filled 2020-08-07: qty 1

## 2020-08-07 MED ORDER — LORAZEPAM 2 MG/ML IJ SOLN: 1.0000 mg | Freq: Once | INTRAMUSCULAR | Status: DC

## 2020-08-07 NOTE — ED Provider Notes (Signed)
Clinical Course as of 08/08/20 1233  Fri Aug 07, 2020  1626 Pt signed out to me by Dr Johnney Killian.  Briefly 65 yo male who had a fall 2 days ago, chronic etoh drinker, presented initially with alcohol intoxication and reported intentional ingestion of several medications.  Pt intoxicated on exam.  Has periorbital hematoma from fall.  Slurred speech.  Etoh 231 (chronically elevated).  Pending trauma CT imaging. 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  Patient on re-examination tells me he has been having balance problems for "about a month."  I've paged neurology.  His orbital wall fx is noted - no ocular entrapment.  This was likely traumatic from his fall 2 days ago. [MT]  1448 Patient's mental status appears cognizant, rational to me.  He now denies to me that he took medications to harm himself, states he is not suicidal, he's got "people to live for." [MT]    Clinical Course User Index [MT] Trifan, Carola Rhine, MD    I spoke to the patient's daughter Dorann Ou initially at [phone] and explained the workup.  I also spoke to his wife Loletta Specter at [phone] and explained medical and psychological workup.  We discussed the issue with artifact seen on CT head, and my preference to continue with MRI of the brain given that he reports ataxia or balance issues.    We also discussed his suicidal gesture today.  I felt it was important to involve them in this discussion for collateral information.  His daughter reports, " That's just like him to try a pity party."  Neither his wife or daughter feel that he was genuinely suicidal, but they express concern about his depression.  I explained the patient is not wanting to cooperate with a psych evaluation, and I feel that an IVC would require repeated use of force and sedation to keep him here against his well.  We collectively agreed that outpatient psychiatry services would be safer option for him.  His daughter will be picking him up tonight from  the ED.  I discussed this entire plan with the patient as well, who reports he will seek outpatient services.  He re-iterated to me that he is not suicidal and does not want to die.  *  MRI reviewed - motion limited but no findings to correlate with the large "infarct" noted on CT imaging, which is now very likely artifact per radiology.  *  Patient ambulated, will discharge.   Wyvonnia Dusky, MD 08/08/20 [phone]

## 2020-08-07 NOTE — Discharge Instructions (Signed)
Summary:  You have a fracture (broken bone) in your eye socket from your fall two days ago.  This will heal on its own.  But you should avoid blowing your nose for 1 week.  If your vision goes blurry, painful, or you have double vision, come back to the ER.   Your MRI scan did NOT show signs of a new stroke.  We talked about you taking too many pills at home tonight.  You told me you were not feeling suicidal, but your family is concerned about your depression and your mental health.  I strongly recommend you make an appointment with our behavioral health clinic at the phone number above.  You may benefit from being on medications that help your mood feel better.  If you ever feel suicidal, or like wanting to hurt yourself or someone else, please call 911 or the suicide hotline above to talk to someone.

## 2020-08-07 NOTE — ED Notes (Signed)
Patient transported to CT 

## 2020-08-07 NOTE — ED Triage Notes (Signed)
Pt BIB [facility] for OD on home meds, AMS, and episodes of LOC. Pt's wife called EMS because the pt "was eating his pills to kill himself." Pt reports taking amlodipine x5, carvedilol x2, atorvastatin x2, and 4 beers. EMS reports pt was having audible and visual hallucinations. Pt denies SI/HI and states he took all of those pills because he is in pain. Pt reports hematuria, HA, and LLQ pain since 11am . Hx of HTN.

## 2020-08-07 NOTE — ED Notes (Signed)
ED Provider at bedside. Patient trying to leave
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
## 2020-08-07 NOTE — Progress Notes (Signed)
Brief Neurology Note  Neurology initially consulted after [facility] report showed:  Large left PCA territory and PCA/MCA watershed territory acute/subacute appearing cortical and subcortical infarct. Minimal petechial hemorrhage without German hemorrhagic conversion. No significant mass effect at this time.  Per ED attending patient has nonfocal neurologic exam. Radiologist Dr. Kellie Simmering subsequently called ED and indicated abnormality may be artifactual and is showing up on [facility] for multiple patients without neurologic deficits on exam.  D/w ED physician who will repeat [place] in different scanner. If new CT is also abnormal he will reconsult neurology. For now we will not continue to follow.  Su Monks, MD [facility] [phone]  If 7pm- 7am, please page neurology on call as listed in [facility].

## 2020-08-10 ENCOUNTER — Telehealth: Payer: Self-pay

## 2020-08-10 ENCOUNTER — Ambulatory Visit: Payer: Self-pay

## 2020-08-10 NOTE — Telephone Encounter (Signed)
Transition Care Management Follow-up Telephone Call  Date of discharge and from where: 08/08/2020 from [facility]  How have you been since you were released from the hospital? Pt states that he is still feeling dizzy and not able to keep fluids down.   Any questions or concerns? No  Items Reviewed:  Did the pt receive and understand the discharge instructions provided? Yes   Medications obtained and verified? Yes   Other? No   Any new allergies since your discharge? No   Dietary orders reviewed? DM and Heart Healthy  Do you have support at home? Yes   Functional Questionnaire: (I = Independent and D = Dependent) ADLs: I  Bathing/Dressing- I  Meal Prep- I  Eating- I  Maintaining continence- I  Transferring/Ambulation- D, pt gets dizzy with ambulation.   Managing Meds- I   Follow up appointments reviewed:  PCP Hospital f/u appt confirmed? No Pt stated he would call for an appointment.   Are transportation arrangements needed? Yes   If their condition worsens, is the pt aware to call PCP or go to the Emergency Dept.? Yes  Was the patient provided with contact information for the PCP's office or ED? Yes  Was to pt encouraged to call back with questions or concerns? Yes

## 2020-08-13 ENCOUNTER — Other Ambulatory Visit: Payer: Medicaid Other

## 2020-08-13 ENCOUNTER — Other Ambulatory Visit: Payer: Self-pay

## 2020-08-13 NOTE — Patient Outreach (Signed)
Compliant on meds, due to be refilled 08/21/20.   Encouraged patient to schedule F/U with PCP [facility] regarding ER visit last week  F/U 1 Month

## 2020-09-03 DIAGNOSIS — R972 Elevated prostate specific antigen [PSA]: Secondary | ICD-10-CM | POA: Diagnosis not present

## 2020-09-03 DIAGNOSIS — R350 Frequency of micturition: Secondary | ICD-10-CM | POA: Diagnosis not present

## 2020-09-03 DIAGNOSIS — N200 Calculus of kidney: Secondary | ICD-10-CM | POA: Diagnosis not present

## 2020-09-03 DIAGNOSIS — N451 Epididymitis: Secondary | ICD-10-CM | POA: Diagnosis not present

## 2020-09-03 DIAGNOSIS — N403 Nodular prostate with lower urinary tract symptoms: Secondary | ICD-10-CM | POA: Diagnosis not present

## 2020-09-11 ENCOUNTER — Other Ambulatory Visit: Payer: Self-pay

## 2020-09-11 NOTE — Patient Outreach (Signed)
Meds scheduled for delivery 09/18/20   Amlodipine 5mg   Atorvastatin 10mg   Carvedilol 12.5mg      No refills needed. F/U 1 month

## 2020-10-02 DIAGNOSIS — Z20822 Contact with and (suspected) exposure to covid-19: Secondary | ICD-10-CM | POA: Diagnosis not present

## 2020-10-06 ENCOUNTER — Ambulatory Visit: Payer: Medicaid Other | Admitting: Internal Medicine

## 2020-10-07 ENCOUNTER — Other Ambulatory Visit: Payer: Self-pay | Admitting: Internal Medicine

## 2020-10-07 DIAGNOSIS — I2510 Atherosclerotic heart disease of native coronary artery without angina pectoris: Secondary | ICD-10-CM

## 2020-10-13 ENCOUNTER — Other Ambulatory Visit: Payer: Self-pay

## 2020-10-13 NOTE — Patient Outreach (Signed)
Due for refills on 10/20/20  Medication  Amlodipine 5mg   Atorvastatin 10mg   Carvedilol 12.5mg       Patient affirmed deliver. He also states he needs help with transportation and hasn't seen Dr. Wynetta Emery in a while because of it. Gave him the number to [place], [employer], who can help set him up with the phone number. F/U 1 Month for refills

## 2020-10-20 ENCOUNTER — Emergency Department (HOSPITAL_COMMUNITY): Payer: Medicaid Other

## 2020-10-20 ENCOUNTER — Emergency Department (HOSPITAL_COMMUNITY)
Admission: EM | Admit: 2020-10-20 | Discharge: 2020-10-20 | Disposition: A | Discharge status: Home / Self Care | Payer: Medicaid Other | Attending: Emergency Medicine | Admitting: Emergency Medicine

## 2020-10-20 ENCOUNTER — Encounter (HOSPITAL_COMMUNITY): Payer: Self-pay | Admitting: Emergency Medicine

## 2020-10-20 ENCOUNTER — Other Ambulatory Visit: Payer: Self-pay

## 2020-10-20 DIAGNOSIS — Z5321 Procedure and treatment not carried out due to patient leaving prior to being seen by health care provider: Secondary | ICD-10-CM | POA: Insufficient documentation

## 2020-10-20 DIAGNOSIS — R112 Nausea with vomiting, unspecified: Secondary | ICD-10-CM | POA: Diagnosis not present

## 2020-10-20 DIAGNOSIS — R079 Chest pain, unspecified: Secondary | ICD-10-CM | POA: Diagnosis not present

## 2020-10-20 DIAGNOSIS — R1032 Left lower quadrant pain: Secondary | ICD-10-CM | POA: Diagnosis not present

## 2020-10-20 DIAGNOSIS — R55 Syncope and collapse: Secondary | ICD-10-CM | POA: Diagnosis not present

## 2020-10-20 DIAGNOSIS — K920 Hematemesis: Secondary | ICD-10-CM | POA: Diagnosis not present

## 2020-10-20 DIAGNOSIS — R069 Unspecified abnormalities of breathing: Secondary | ICD-10-CM | POA: Diagnosis not present

## 2020-10-20 DIAGNOSIS — R109 Unspecified abdominal pain: Secondary | ICD-10-CM | POA: Diagnosis not present

## 2020-10-20 DIAGNOSIS — R1084 Generalized abdominal pain: Secondary | ICD-10-CM | POA: Diagnosis not present

## 2020-10-20 DIAGNOSIS — R0789 Other chest pain: Secondary | ICD-10-CM | POA: Diagnosis not present

## 2020-10-20 DIAGNOSIS — R1012 Left upper quadrant pain: Secondary | ICD-10-CM | POA: Diagnosis not present

## 2020-10-20 DIAGNOSIS — R319 Hematuria, unspecified: Secondary | ICD-10-CM | POA: Diagnosis not present

## 2020-10-20 DIAGNOSIS — R58 Hemorrhage, not elsewhere classified: Secondary | ICD-10-CM | POA: Diagnosis not present

## 2020-10-20 LAB — COMPREHENSIVE METABOLIC PANEL
ALT: 28 U/L (ref 0–44)
AST: 41 U/L (ref 15–41)
Albumin: 4.5 g/dL (ref 3.5–5.0)
Alkaline Phosphatase: 80 U/L (ref 38–126)
Anion gap: 13 (ref 5–15)
BUN: 7 mg/dL — ABNORMAL LOW (ref 8–23)
CO2: 24 mmol/L (ref 22–32)
Calcium: 9.5 mg/dL (ref 8.9–10.3)
Chloride: 95 mmol/L — ABNORMAL LOW (ref 98–111)
Creatinine, Ser: 0.92 mg/dL (ref 0.61–1.24)
GFR, Estimated: >60 mL/min (ref >60)
Glucose, Bld: 77 mg/dL (ref 70–99)
Potassium: 3.5 mmol/L (ref 3.5–5.1)
Sodium: 132 mmol/L — ABNORMAL LOW (ref 135–145)
Total Bilirubin: 0.5 mg/dL (ref 0.3–1.2)
Total Protein: 8.5 g/dL — ABNORMAL HIGH (ref 6.5–8.1)

## 2020-10-20 LAB — CBC
HCT: 44.1 % (ref 39.0–52.0)
Hemoglobin: 15.1 g/dL (ref 13.0–17.0)
MCH: 25.3 pg — ABNORMAL LOW (ref 26.0–34.0)
MCHC: 34.2 g/dL (ref 30.0–36.0)
MCV: 74 fL — ABNORMAL LOW (ref 80.0–100.0)
Platelets: 233 10*3/uL (ref 150–400)
RBC: 5.96 MIL/uL — ABNORMAL HIGH (ref 4.22–5.81)
RDW: 14.9 % (ref 11.5–15.5)
WBC: 4.3 10*3/uL (ref 4.0–10.5)
nRBC: 0 % (ref 0.0–0.2)

## 2020-10-20 LAB — TROPONIN I (HIGH SENSITIVITY): Troponin I (High Sensitivity): 6 ng/L (ref <18)

## 2020-10-20 LAB — LIPASE, BLOOD: Lipase: 25 U/L (ref 11–51)

## 2020-10-20 NOTE — ED Notes (Signed)
Pt called from triage with no answer and wasn't seen in the lobby after he was asking for a cab when he found out the wait was long

## 2020-10-20 NOTE — ED Provider Notes (Signed)
Emergency Medicine Provider Triage Evaluation Note  Troy Mclaughlin , a 65 y.o. male  was evaluated in triage.  Pt complains of abdominal pain, vomiting, hematuria.  Reports history of hematuria which he sees urology for.  States that the vomiting, diarrhea and abdominal pain has been going on for 3 days.  3 days ago started having chest pain and had an episode where he "blacked out at home."  Denies any hematemesis.  Review of Systems  Positive: Abdominal pain, vomiting, hematuria Negative: Hematemesis  Physical Exam  BP (!) 153/93 (BP Location: Left Arm)   Pulse 82   Temp 98.3 F (36.8 C) (Oral)   Resp 17   Ht 5\' 9"  (1.753 m)   Wt 63.5 kg   SpO2 98%   BMI 20.67 kg/m  Gen:   Awake, no distress   Resp:  Normal effort  MSK:   Moves extremities without difficulty  Other:  Generalized abdominal tenderness  Medical Decision Making  Medically screening exam initiated at 9:22 PM.  Appropriate orders placed.  Emile Kyllo Mistry was informed that the remainder of the evaluation will be completed by another provider, this initial triage assessment does not replace that evaluation, and the importance of remaining in the ED until their evaluation is complete.  Lab work, imaging ordered   Delia Heady, PA-C 10/20/20 2123    Wyvonnia Dusky, MD 10/21/20 [phone]

## 2020-10-20 NOTE — ED Notes (Signed)
Attempted to call pt to update vital signs.

## 2020-10-20 NOTE — ED Triage Notes (Signed)
Per EMS pt has been having nausea, vomiting, and hematuria for 3 days. Pt reports he has been unable to keep any food down. Pt has left upper and lower abdominal pain. Pt told EMS that he has a CT scan scheduled d/t bleeding in his head. Pt states he has had chest pain for the past 3 days

## 2020-10-23 DIAGNOSIS — Z1152 Encounter for screening for COVID-19: Secondary | ICD-10-CM | POA: Diagnosis not present

## 2020-10-27 DIAGNOSIS — N400 Enlarged prostate without lower urinary tract symptoms: Secondary | ICD-10-CM | POA: Diagnosis not present

## 2020-10-27 DIAGNOSIS — D1809 Hemangioma of other sites: Secondary | ICD-10-CM | POA: Diagnosis not present

## 2020-10-27 DIAGNOSIS — R972 Elevated prostate specific antigen [PSA]: Secondary | ICD-10-CM | POA: Diagnosis not present

## 2020-10-27 DIAGNOSIS — N200 Calculus of kidney: Secondary | ICD-10-CM | POA: Diagnosis not present

## 2020-10-27 DIAGNOSIS — C61 Malignant neoplasm of prostate: Secondary | ICD-10-CM | POA: Diagnosis not present

## 2020-10-27 DIAGNOSIS — R310 Gross hematuria: Secondary | ICD-10-CM | POA: Diagnosis not present

## 2020-11-05 ENCOUNTER — Other Ambulatory Visit: Payer: Self-pay | Admitting: Urology

## 2020-11-05 ENCOUNTER — Other Ambulatory Visit (HOSPITAL_COMMUNITY): Payer: Self-pay | Admitting: Urology

## 2020-11-05 DIAGNOSIS — C61 Malignant neoplasm of prostate: Secondary | ICD-10-CM

## 2020-11-12 ENCOUNTER — Other Ambulatory Visit: Payer: Medicaid Other

## 2020-11-12 NOTE — Patient Outreach (Signed)
Due for meds on 11/18/20  Amlodipine   5 mg  Carvedilol   12.5 mg  Atorvastatin   10 mg   Nitroglycerin   0.4 mg  Tamsulosin   0.4 mg     F/U 1 month

## 2020-11-13 ENCOUNTER — Emergency Department (HOSPITAL_COMMUNITY)
Admission: EM | Admit: 2020-11-13 | Discharge: 2020-11-14 | Disposition: A | Discharge status: Home / Self Care | Payer: Medicaid Other | Attending: Emergency Medicine | Admitting: Emergency Medicine

## 2020-11-13 ENCOUNTER — Emergency Department (HOSPITAL_COMMUNITY): Payer: Medicaid Other

## 2020-11-13 ENCOUNTER — Other Ambulatory Visit: Payer: Self-pay

## 2020-11-13 DIAGNOSIS — I2510 Atherosclerotic heart disease of native coronary artery without angina pectoris: Secondary | ICD-10-CM | POA: Diagnosis not present

## 2020-11-13 DIAGNOSIS — Z79899 Other long term (current) drug therapy: Secondary | ICD-10-CM | POA: Diagnosis not present

## 2020-11-13 DIAGNOSIS — E119 Type 2 diabetes mellitus without complications: Secondary | ICD-10-CM | POA: Insufficient documentation

## 2020-11-13 DIAGNOSIS — R1310 Dysphagia, unspecified: Secondary | ICD-10-CM | POA: Insufficient documentation

## 2020-11-13 DIAGNOSIS — R300 Dysuria: Secondary | ICD-10-CM | POA: Insufficient documentation

## 2020-11-13 DIAGNOSIS — Z859 Personal history of malignant neoplasm, unspecified: Secondary | ICD-10-CM | POA: Insufficient documentation

## 2020-11-13 DIAGNOSIS — Z20822 Contact with and (suspected) exposure to covid-19: Secondary | ICD-10-CM | POA: Insufficient documentation

## 2020-11-13 DIAGNOSIS — F17210 Nicotine dependence, cigarettes, uncomplicated: Secondary | ICD-10-CM | POA: Insufficient documentation

## 2020-11-13 DIAGNOSIS — R112 Nausea with vomiting, unspecified: Secondary | ICD-10-CM | POA: Diagnosis not present

## 2020-11-13 DIAGNOSIS — D649 Anemia, unspecified: Secondary | ICD-10-CM | POA: Insufficient documentation

## 2020-11-13 DIAGNOSIS — M791 Myalgia, unspecified site: Secondary | ICD-10-CM

## 2020-11-13 DIAGNOSIS — R6889 Other general symptoms and signs: Secondary | ICD-10-CM

## 2020-11-13 DIAGNOSIS — J111 Influenza due to unidentified influenza virus with other respiratory manifestations: Secondary | ICD-10-CM | POA: Diagnosis not present

## 2020-11-13 DIAGNOSIS — I10 Essential (primary) hypertension: Secondary | ICD-10-CM | POA: Insufficient documentation

## 2020-11-13 DIAGNOSIS — R059 Cough, unspecified: Secondary | ICD-10-CM | POA: Diagnosis not present

## 2020-11-13 LAB — COMPREHENSIVE METABOLIC PANEL
ALT: 18 U/L (ref 0–44)
AST: 21 U/L (ref 15–41)
Albumin: 3.4 g/dL — ABNORMAL LOW (ref 3.5–5.0)
Alkaline Phosphatase: 56 U/L (ref 38–126)
Anion gap: 7 (ref 5–15)
BUN: 9 mg/dL (ref 8–23)
CO2: 27 mmol/L (ref 22–32)
Calcium: 9.3 mg/dL (ref 8.9–10.3)
Chloride: 100 mmol/L (ref 98–111)
Creatinine, Ser: 0.84 mg/dL (ref 0.61–1.24)
GFR, Estimated: >60 mL/min (ref >60)
Glucose, Bld: 81 mg/dL (ref 70–99)
Potassium: 3.3 mmol/L — ABNORMAL LOW (ref 3.5–5.1)
Sodium: 134 mmol/L — ABNORMAL LOW (ref 135–145)
Total Bilirubin: 0.5 mg/dL (ref 0.3–1.2)
Total Protein: 6.9 g/dL (ref 6.5–8.1)

## 2020-11-13 LAB — CBC WITH DIFFERENTIAL/PLATELET
Abs Immature Granulocytes: 0.02 10*3/uL (ref 0.00–0.07)
Basophils Absolute: 0 10*3/uL (ref 0.0–0.1)
Basophils Relative: 1 %
Eosinophils Absolute: 0.1 10*3/uL (ref 0.0–0.5)
Eosinophils Relative: 3 %
HCT: 34.6 % — ABNORMAL LOW (ref 39.0–52.0)
Hemoglobin: 11.6 g/dL — ABNORMAL LOW (ref 13.0–17.0)
Immature Granulocytes: 0 %
Lymphocytes Relative: 34 %
Lymphs Abs: 1.7 10*3/uL (ref 0.7–4.0)
MCH: 25.4 pg — ABNORMAL LOW (ref 26.0–34.0)
MCHC: 33.5 g/dL (ref 30.0–36.0)
MCV: 75.7 fL — ABNORMAL LOW (ref 80.0–100.0)
Monocytes Absolute: 0.7 10*3/uL (ref 0.1–1.0)
Monocytes Relative: 13 %
Neutro Abs: 2.5 10*3/uL (ref 1.7–7.7)
Neutrophils Relative %: 49 %
Platelets: 206 10*3/uL (ref 150–400)
RBC: 4.57 MIL/uL (ref 4.22–5.81)
RDW: 14.8 % (ref 11.5–15.5)
WBC: 5.1 10*3/uL (ref 4.0–10.5)
nRBC: 0 % (ref 0.0–0.2)

## 2020-11-13 LAB — URINALYSIS, ROUTINE W REFLEX MICROSCOPIC
Bacteria, UA: NONE SEEN
Bilirubin Urine: NEGATIVE
Glucose, UA: 50 mg/dL — AB
Ketones, ur: NEGATIVE mg/dL
Leukocytes,Ua: NEGATIVE
Nitrite: NEGATIVE
Protein, ur: NEGATIVE mg/dL
Specific Gravity, Urine: 1.008 (ref 1.005–1.030)
pH: 7 (ref 5.0–8.0)

## 2020-11-13 MED ORDER — ACETAMINOPHEN 325 MG PO TABS
650.0000 mg | ORAL_TABLET | Freq: Once | ORAL | Status: AC
  Administered 2020-11-14: 650 mg via ORAL
  Filled 2020-11-13: qty 2

## 2020-11-13 MED ORDER — OXYCODONE-ACETAMINOPHEN 5-325 MG PO TABS
1.0000 | ORAL_TABLET | Freq: Once | ORAL | Status: AC
  Administered 2020-11-13: 1 via ORAL
  Filled 2020-11-13: qty 1

## 2020-11-13 NOTE — ED Notes (Signed)
Pt is expressing distaste for his care.  He wansn't happy with receiving PO meds instead of IV meds and doesn't understand why we havent checked more than blood work and urine at this time.

## 2020-11-13 NOTE — Discharge Instructions (Signed)
You came to the emergency department today with reports of Covid-19 like symptoms.  These symptoms may be due to Covid 19 or another viral illness.  You have a COVID test pending. Please isolate at home while awaiting your results.  You can find your COVID-19 results on your [facility] MyChart.  > If your test is negative, stay home until your fever has resolved/your symptoms are improving. > If your test is positive, isolate at home for at least 7 days after the day your symptoms initially began, and THEN at least 24 hours after you are fever-free without the help of medications (Tylenol/acetaminophen and Advil/ibuprofen/Motrin) AND your symptoms are improving.  You can alternate Tylenol/acetaminophen and Advil/ibuprofen/Motrin every 4 hours for sore throat, body aches, headache or fever.  Drink plenty of water.  Use saline nasal spray for congestion. Wash your hands frequently. Please rest as needed with frequent repositioning and ambulation as tolerated.    If you use a CPAP or BiPAP device for management of obstructive sleep apnea may continue to use it however use it when isolated from other individuals to avoid spread of COVID-19.   If you use a nebulizer administer medication such as albuterol you may continue to use it however only one isolated from other individuals to avoid the spread of COVID-19.  If your symptoms do not improve please follow-up with your primary care provider or urgent care.  Return to the ER for significant shortness of breath, uncontrollable vomiting, severe chest pain, inability to tolerate fluids, changes in mental status such as confusion or other concerning symptoms.

## 2020-11-13 NOTE — ED Triage Notes (Signed)
Pt c/o generalized bodyaches, weakness, HA, throat tightness/hard to swallow x3 days. Pt has follow up on 28th. Recent diagnosis of stage 4 stomach cancer, has not started chemo/radiation.
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
## 2020-11-13 NOTE — ED Notes (Signed)
Continued conversation about care explaining further the process.  Pt seems to understand a little more now

## 2020-11-14 LAB — RESP PANEL BY RT-PCR (FLU A&B, COVID) ARPGX2
Influenza A by PCR: NEGATIVE
Influenza B by PCR: NEGATIVE
SARS Coronavirus 2 by RT PCR: NEGATIVE

## 2020-11-14 NOTE — ED Notes (Signed)
DC instructions reviewed with pt. Pt verbalized instructions.  Pt DC.

## 2020-11-15 LAB — URINE CULTURE: Culture: <10000 — AB

## 2020-11-16 ENCOUNTER — Telehealth: Payer: Self-pay | Admitting: Nurse Practitioner

## 2020-11-16 ENCOUNTER — Telehealth: Payer: Self-pay

## 2020-11-16 NOTE — Telephone Encounter (Signed)
Transition Care Management Unsuccessful Follow-up Telephone Call  Date of discharge and from where:  11/14/2020 from [facility]  Attempts:  1st Attempt  Reason for unsuccessful TCM follow-up call:  Left voice message

## 2020-11-16 NOTE — Telephone Encounter (Signed)
VM was left Fu on Most recent ED Visit

## 2020-11-17 ENCOUNTER — Encounter (HOSPITAL_COMMUNITY)
Admission: RE | Admit: 2020-11-17 | Discharge: 2020-11-17 | Disposition: A | Discharge status: Home / Self Care | Payer: Medicaid Other | Source: Ambulatory Visit | Attending: Urology | Admitting: Urology

## 2020-11-17 ENCOUNTER — Other Ambulatory Visit: Payer: Self-pay

## 2020-11-17 DIAGNOSIS — C61 Malignant neoplasm of prostate: Secondary | ICD-10-CM | POA: Insufficient documentation

## 2020-11-17 DIAGNOSIS — Z8546 Personal history of malignant neoplasm of prostate: Secondary | ICD-10-CM | POA: Diagnosis not present

## 2020-11-17 DIAGNOSIS — Z20822 Contact with and (suspected) exposure to covid-19: Secondary | ICD-10-CM | POA: Diagnosis not present

## 2020-11-17 MED ORDER — TECHNETIUM TC 99M MEDRONATE IV KIT
21.0000 | PACK | Freq: Once | INTRAVENOUS | Status: AC
  Administered 2020-11-17: 21 via INTRAVENOUS

## 2020-11-17 NOTE — Telephone Encounter (Signed)
Transition Care Management Unsuccessful Follow-up Telephone Call  Date of discharge and from where:  11/13/2020 from [facility]  Attempts:  2nd Attempt  Reason for unsuccessful TCM follow-up call:  Unable to reach patient

## 2020-11-18 NOTE — Telephone Encounter (Signed)
Transition Care Management Follow-up Telephone Call Date of discharge and from where: 11/13/2020 from [facility] How have you been since you were released from the hospital? Pt stated that he is feeling better and has no questions or concerns at this time Any questions or concerns? No  Items Reviewed: Did the pt receive and understand the discharge instructions provided? Yes  Medications obtained and verified? Yes  Other? No  Any new allergies since your discharge? No  Dietary orders reviewed? No Do you have support at home? Yes   Functional Questionnaire: (I = Independent and D = Dependent) ADLs: I  Bathing/Dressing- I  Meal Prep- I  Eating- I  Maintaining continence- I  Transferring/Ambulation- I  Managing Meds- I   Follow up appointments reviewed:  PCP Hospital f/u appt confirmed? No   Specialist Hospital f/u appt confirmed? No   Are transportation arrangements needed? No  If their condition worsens, is the pt aware to call PCP or go to the Emergency Dept.? Yes Was the patient provided with contact information for the PCP's office or ED? Yes Was to pt encouraged to call back with questions or concerns? Yes

## 2020-11-24 DIAGNOSIS — Z20822 Contact with and (suspected) exposure to covid-19: Secondary | ICD-10-CM | POA: Diagnosis not present

## 2020-11-26 DIAGNOSIS — Z20822 Contact with and (suspected) exposure to covid-19: Secondary | ICD-10-CM | POA: Diagnosis not present

## 2020-11-30 DIAGNOSIS — Z20822 Contact with and (suspected) exposure to covid-19: Secondary | ICD-10-CM | POA: Diagnosis not present

## 2020-12-01 ENCOUNTER — Emergency Department (HOSPITAL_COMMUNITY)
Admission: EM | Admit: 2020-12-01 | Discharge: 2020-12-02 | Disposition: A | Discharge status: Home / Self Care | Payer: Medicaid Other | Attending: Emergency Medicine | Admitting: Emergency Medicine

## 2020-12-01 ENCOUNTER — Other Ambulatory Visit: Payer: Self-pay

## 2020-12-01 ENCOUNTER — Encounter (HOSPITAL_COMMUNITY): Payer: Self-pay | Admitting: Emergency Medicine

## 2020-12-01 ENCOUNTER — Emergency Department (HOSPITAL_COMMUNITY): Payer: Medicaid Other

## 2020-12-01 DIAGNOSIS — M549 Dorsalgia, unspecified: Secondary | ICD-10-CM | POA: Diagnosis not present

## 2020-12-01 DIAGNOSIS — R404 Transient alteration of awareness: Secondary | ICD-10-CM | POA: Diagnosis not present

## 2020-12-01 DIAGNOSIS — R4182 Altered mental status, unspecified: Secondary | ICD-10-CM | POA: Diagnosis not present

## 2020-12-01 DIAGNOSIS — F10920 Alcohol use, unspecified with intoxication, uncomplicated: Secondary | ICD-10-CM

## 2020-12-01 DIAGNOSIS — F17210 Nicotine dependence, cigarettes, uncomplicated: Secondary | ICD-10-CM | POA: Insufficient documentation

## 2020-12-01 DIAGNOSIS — R410 Disorientation, unspecified: Secondary | ICD-10-CM | POA: Diagnosis not present

## 2020-12-01 DIAGNOSIS — M542 Cervicalgia: Secondary | ICD-10-CM | POA: Insufficient documentation

## 2020-12-01 DIAGNOSIS — M545 Low back pain, unspecified: Secondary | ICD-10-CM | POA: Insufficient documentation

## 2020-12-01 DIAGNOSIS — M5030 Other cervical disc degeneration, unspecified cervical region: Secondary | ICD-10-CM | POA: Diagnosis not present

## 2020-12-01 DIAGNOSIS — Z79899 Other long term (current) drug therapy: Secondary | ICD-10-CM | POA: Diagnosis not present

## 2020-12-01 DIAGNOSIS — I2510 Atherosclerotic heart disease of native coronary artery without angina pectoris: Secondary | ICD-10-CM | POA: Diagnosis not present

## 2020-12-01 DIAGNOSIS — F10220 Alcohol dependence with intoxication, uncomplicated: Secondary | ICD-10-CM | POA: Insufficient documentation

## 2020-12-01 DIAGNOSIS — I119 Hypertensive heart disease without heart failure: Secondary | ICD-10-CM | POA: Diagnosis not present

## 2020-12-01 DIAGNOSIS — R296 Repeated falls: Secondary | ICD-10-CM | POA: Diagnosis not present

## 2020-12-01 DIAGNOSIS — R197 Diarrhea, unspecified: Secondary | ICD-10-CM | POA: Diagnosis not present

## 2020-12-01 DIAGNOSIS — Z859 Personal history of malignant neoplasm, unspecified: Secondary | ICD-10-CM | POA: Diagnosis not present

## 2020-12-01 DIAGNOSIS — R112 Nausea with vomiting, unspecified: Secondary | ICD-10-CM | POA: Insufficient documentation

## 2020-12-01 DIAGNOSIS — W19XXXA Unspecified fall, initial encounter: Secondary | ICD-10-CM | POA: Insufficient documentation

## 2020-12-01 DIAGNOSIS — R55 Syncope and collapse: Secondary | ICD-10-CM | POA: Diagnosis not present

## 2020-12-01 DIAGNOSIS — E119 Type 2 diabetes mellitus without complications: Secondary | ICD-10-CM | POA: Insufficient documentation

## 2020-12-01 DIAGNOSIS — Y907 Blood alcohol level of 200-239 mg/100 ml: Secondary | ICD-10-CM | POA: Insufficient documentation

## 2020-12-01 LAB — COMPREHENSIVE METABOLIC PANEL
ALT: 15 U/L (ref 0–44)
AST: 30 U/L (ref 15–41)
Albumin: 3.5 g/dL (ref 3.5–5.0)
Alkaline Phosphatase: 76 U/L (ref 38–126)
Anion gap: 7 (ref 5–15)
BUN: <5 mg/dL — ABNORMAL LOW (ref 8–23)
CO2: 25 mmol/L (ref 22–32)
Calcium: 8.8 mg/dL — ABNORMAL LOW (ref 8.9–10.3)
Chloride: 100 mmol/L (ref 98–111)
Creatinine, Ser: 0.87 mg/dL (ref 0.61–1.24)
GFR, Estimated: >60 mL/min (ref >60)
Glucose, Bld: 91 mg/dL (ref 70–99)
Potassium: 3.9 mmol/L (ref 3.5–5.1)
Sodium: 132 mmol/L — ABNORMAL LOW (ref 135–145)
Total Bilirubin: 0.3 mg/dL (ref 0.3–1.2)
Total Protein: 6.9 g/dL (ref 6.5–8.1)

## 2020-12-01 LAB — CBC WITH DIFFERENTIAL/PLATELET
Abs Immature Granulocytes: 0 10*3/uL (ref 0.00–0.07)
Basophils Absolute: 0 10*3/uL (ref 0.0–0.1)
Basophils Relative: 1 %
Eosinophils Absolute: 0.1 10*3/uL (ref 0.0–0.5)
Eosinophils Relative: 3 %
HCT: 36.9 % — ABNORMAL LOW (ref 39.0–52.0)
Hemoglobin: 12.7 g/dL — ABNORMAL LOW (ref 13.0–17.0)
Immature Granulocytes: 0 %
Lymphocytes Relative: 54 %
Lymphs Abs: 1.9 10*3/uL (ref 0.7–4.0)
MCH: 25.4 pg — ABNORMAL LOW (ref 26.0–34.0)
MCHC: 34.4 g/dL (ref 30.0–36.0)
MCV: 73.8 fL — ABNORMAL LOW (ref 80.0–100.0)
Monocytes Absolute: 0.4 10*3/uL (ref 0.1–1.0)
Monocytes Relative: 11 %
Neutro Abs: 1.1 10*3/uL — ABNORMAL LOW (ref 1.7–7.7)
Neutrophils Relative %: 31 %
Platelets: 211 10*3/uL (ref 150–400)
RBC: 5 MIL/uL (ref 4.22–5.81)
RDW: 14.2 % (ref 11.5–15.5)
WBC: 3.5 10*3/uL — ABNORMAL LOW (ref 4.0–10.5)
nRBC: 0 % (ref 0.0–0.2)

## 2020-12-01 LAB — URINALYSIS, ROUTINE W REFLEX MICROSCOPIC
Bacteria, UA: NONE SEEN
Bilirubin Urine: NEGATIVE
Glucose, UA: NEGATIVE mg/dL
Ketones, ur: NEGATIVE mg/dL
Leukocytes,Ua: NEGATIVE
Nitrite: NEGATIVE
Protein, ur: NEGATIVE mg/dL
Specific Gravity, Urine: 1.001 — ABNORMAL LOW (ref 1.005–1.030)
pH: 6 (ref 5.0–8.0)

## 2020-12-01 LAB — ETHANOL: Alcohol, Ethyl (B): 233 mg/dL — ABNORMAL HIGH (ref <10)

## 2020-12-01 LAB — LACTIC ACID, PLASMA: Lactic Acid, Venous: 1.1 mmol/L (ref 0.5–1.9)

## 2020-12-01 MED ORDER — MORPHINE SULFATE (PF) 4 MG/ML IV SOLN
4.0000 mg | Freq: Once | INTRAVENOUS | Status: AC
  Administered 2020-12-02: 4 mg via INTRAVENOUS
  Filled 2020-12-01: qty 1

## 2020-12-01 MED ORDER — SODIUM CHLORIDE 0.9 % IV BOLUS
1000.0000 mL | Freq: Once | INTRAVENOUS | Status: AC
  Administered 2020-12-02: 1000 mL via INTRAVENOUS
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
## 2020-12-01 NOTE — ED Triage Notes (Signed)
Pt bib [facility] from home for AMS, and multiple falls over the last few days. Per wife, pt has been wandering around, confused, not recognizing people. Hx COPD, DM, stomach CA. Pt c/o neck and back pain, generalized weakness, and nausea. C-collar on, no obvious injuries.

## 2020-12-01 NOTE — ED Notes (Signed)
Patient transported to X-ray 

## 2020-12-01 NOTE — ED Notes (Signed)
Patient transported to CT 

## 2020-12-02 NOTE — ED Notes (Signed)
Family updated, Son Lucianne Lei can pick up or arrange transport when pt is D/C. [phone]

## 2020-12-02 NOTE — ED Provider Notes (Signed)
  Physical Exam  BP 127/87   Pulse (!) 59   Temp 98.3 F (36.8 C) (Temporal)   Resp 16   Ht 5\' 9"  (1.753 m)   Wt 64 kg   SpO2 98%   BMI 20.84 kg/m   Physical Exam  ED Course/Procedures     Procedures  MDM  Patient with mental status change.  Received in signout.  Lab work reviewed and overall reassuring.  Mental status change thought to be secondary to alcohol intoxication.  Mental status improved after the night.  Complaining of abdominal pain which is somewhat of a chronic complaint for him.  Benign exam.  Will discharge home.       Davonna Belling, MD 12/02/20 [phone]

## 2020-12-02 NOTE — ED Notes (Signed)
Pt ambulated with a steady gait with no assistance.

## 2020-12-03 ENCOUNTER — Telehealth: Payer: Self-pay

## 2020-12-03 ENCOUNTER — Other Ambulatory Visit: Payer: Self-pay | Admitting: Internal Medicine

## 2020-12-03 DIAGNOSIS — I25118 Atherosclerotic heart disease of native coronary artery with other forms of angina pectoris: Secondary | ICD-10-CM

## 2020-12-03 DIAGNOSIS — Z20822 Contact with and (suspected) exposure to covid-19: Secondary | ICD-10-CM | POA: Diagnosis not present

## 2020-12-03 NOTE — Telephone Encounter (Addendum)
Transition Care Management Unsuccessful Follow-up Telephone Call  Date of discharge and from where:  12/02/2020-[facility] ED   Attempts:  1st Attempt  Reason for unsuccessful TCM follow-up call:  Left voice message

## 2020-12-03 NOTE — Telephone Encounter (Signed)
Transition Care Management Follow-up Telephone Call Date of discharge and from where: 12/02/2020-[facility] ED  How have you been since you were released from the hospital? Still does not have an appetite  Any questions or concerns? No  Items Reviewed: Did the pt receive and understand the discharge instructions provided? Yes  Medications obtained and verified? Yes  Other? No  Any new allergies since your discharge? No  Dietary orders reviewed? N/A Do you have support at home? Yes   Home Care and Equipment/Supplies: Were home health services ordered? not applicable If so, what is the name of the agency? N/A  Has the agency set up a time to come to the patient's home? not applicable Were any new equipment or medical supplies ordered?  No What is the name of the medical supply agency? N/A Were you able to get the supplies/equipment? not applicable Do you have any questions related to the use of the equipment or supplies? No  Functional Questionnaire: (I = Independent and D = Dependent) ADLs: I  Bathing/Dressing- I  Meal Prep- I  Eating- I  Maintaining continence- I  Transferring/Ambulation- I  Managing Meds- I  Follow up appointments reviewed:  PCP Hospital f/u appt confirmed? No  Advised to call PCP to schedule follow up [facility] f/u appt confirmed? No   Are transportation arrangements needed? No  If their condition worsens, is the pt aware to call PCP or go to the Emergency Dept.? Yes Was the patient provided with contact information for the PCP's office or ED? Yes Was to pt encouraged to call back with questions or concerns? Yes

## 2020-12-08 ENCOUNTER — Other Ambulatory Visit: Payer: Self-pay | Admitting: Internal Medicine

## 2020-12-08 ENCOUNTER — Other Ambulatory Visit: Payer: Self-pay | Admitting: Obstetrics and Gynecology

## 2020-12-08 ENCOUNTER — Other Ambulatory Visit: Payer: Self-pay

## 2020-12-08 DIAGNOSIS — I25118 Atherosclerotic heart disease of native coronary artery with other forms of angina pectoris: Secondary | ICD-10-CM

## 2020-12-08 DIAGNOSIS — I2510 Atherosclerotic heart disease of native coronary artery without angina pectoris: Secondary | ICD-10-CM

## 2020-12-08 NOTE — Patient Outreach (Signed)
Medicaid Managed Care   Nurse Care Manager Note  12/08/2020 Name:  Troy Mclaughlin MRN:  [id number] DOB:  Jun 16, 1955  Troy Mclaughlin is an 65 y.o. year old male who is a primary patient of Troy Pier, MD.  The [facility] Managed Care Coordination team was consulted for assistance with:    Chronic healthcare management needs.  [name] was given information about Medicaid Managed Care Coordination team services today. Troy Mclaughlin agreed to services and verbal consent obtained.  Engaged with patient by telephone for follow up visit in response to provider referral for case management and/or care coordination services.   Assessments/Interventions:  Review of past medical history, allergies, medications, health status, including review of consultants reports, laboratory and other test data, was performed as part of comprehensive evaluation and provision of chronic care management services.  SDOH (Social Determinants of Health) assessments and interventions performed:   Care Plan  Allergies  Allergen Reactions   Lisinopril Anaphylaxis and Swelling    angioedema   Nsaids Other (See Comments)    Bleeding ulcers    Medications Reviewed Today     Reviewed by Gayla Medicus, RN (Registered Nurse) on 12/08/20 at 29  Med List Status: <None>   Medication Order Taking? Sig Documenting Provider Last Dose Status Informant  ACCU-CHEK FASTCLIX LANCETS [place] [id number] No Use as directed once daily  Patient not taking: Reported on 12/08/2020   Troy Pier, MD Not Taking Active Multiple Informants  amLODipine (NORVASC) 5 MG tablet [id number] No Take 1 tablet (5 mg total) by mouth daily.  Patient not taking: Reported on 12/08/2020   Troy Pier, MD Not Taking Active Multiple Informants  atorvastatin (LIPITOR) 10 MG tablet [id number] No TAKE ONE TABLET BY MOUTH EVERY MORNING  Patient not taking: Reported on 12/08/2020   Troy Pier, MD Not Taking Active   Blood Glucose  Monitoring Suppl (ACCU-CHEK GUIDE) w/Device KIT [id number] No 1 each by Does not apply route daily.  Patient not taking: No sig reported   Troy Pier, MD Not Taking Active Multiple Informants  carvedilol (COREG) 12.5 MG tablet [id number] No Take 0.5 tablets (6.25 mg total) by mouth 2 (two) times daily with a meal.  Patient not taking: Reported on 12/08/2020   Troy Pier, MD Not Taking Active            Med Note Orvan Seen, Sharlette Dense   Fri Nov 13, 2020  9:43 PM) Pt states that he does not cut these in half - taking one tablet once daily  glucose blood (ACCU-CHEK GUIDE) test strip [id number] No Use as instructed to test blood sugar once daily  Patient not taking: Reported on 12/08/2020   Troy Pier, MD Not Taking Active Multiple Informants  naproxen sodium (ALEVE) 220 MG tablet [id number] No Take 440 mg by mouth 2 (two) times daily as needed (pain).  Patient not taking: Reported on 12/08/2020   [provider] Not Taking Active Self  nicotine (NICODERM CQ - DOSED IN MG/24 HOURS) 14 mg/24hr patch [id number] Yes Place 1 patch (14 mg total) onto the skin daily. Troy Pier, MD Taking Active   nitroGLYCERIN (NITROSTAT) 0.4 MG SL tablet [id number] No Place 1 tablet (0.4 mg total) under the tongue every 5 (five) minutes as needed for chest pain.  Patient not taking: Reported on 12/08/2020   Troy Pier, MD Not Taking Active Multiple Informants  pantoprazole (PROTONIX) 40 MG tablet [id number] No Take 1 tablet (40  mg total) by mouth daily.  Patient not taking: No sig reported   Sherwood Gambler, MD Not Taking Active   polyvinyl alcohol (LIQUIFILM TEARS) 1.4 % ophthalmic solution [id number] No Place 1 drop into both eyes daily as needed for dry eyes.  Patient not taking: Reported on 12/08/2020   [provider] Not Taking Active Self  tamsulosin (FLOMAX) 0.4 MG CAPS capsule [id number] No Take 1 capsule (0.4 mg total) by mouth daily.  Patient not taking: Reported on  12/08/2020   Troy Pier, MD Not Taking Active Multiple Informants           Med Note Orvan Seen, Sharlette Dense   Fri Nov 13, 2020  9:42 PM) Refill to be delivered Tuesday - no record of this being filled in past 6 months  traMADol (ULTRAM) 50 MG tablet [id number] No Take 1 tablet (50 mg total) by mouth daily as needed.  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[place])    Diabetes mellitus without complication ([place])    Alcohol abuse    Acute gastric ulcer with hemorrhage    GI bleed 03/30/2019   Abdominal pain, left lower quadrant    Blood in the stool    Alcoholic gastritis 97/98/9211   Non-intractable vomiting 05/27/2018   Hyperkalemia 05/27/2018   Erectile dysfunction 02/09/2017   Tobacco dependence 02/09/2017   Hyperlipidemia 01/18/2017   Gastroesophageal reflux disease with esophagitis    Renal stone    Type 2 diabetes mellitus without complication, without long-term current use of insulin ([place]) 08/16/2016   Abdominal wall pain    Coronary artery disease involving native coronary artery of native heart without angina pectoris    Atypical chest pain    Essential hypertension    History of ST elevation myocardial infarction (STEMI) 07/06/2013   History of small bowel obstruction 07/29/2008   GLAUCOMA 04/15/2008    Conditions to be addressed/monitored per PCP order:   chronic healthcare management needs,  HTN, diabetes, tob dep, renal stones, EtOH use disorder, GERD, TIA, nonobstructive CAD (75% mRCA 2015), low testosterone, ED  Care Plan : General Plan of Care (Adult)   Updates made by Gayla Medicus, RN since 12/08/2020 12:00 AM     Problem: Health Promotion or Disease Self-Management (General Plan of Care)   Priority: High  Onset Date: 05/22/2020     Long-Range Goal: Self-Management Plan Developed   Start Date: 04/24/2020  Expected End Date: 03/10/2021  Recent Progress: Not on track  Priority: High  Note:   Current Barriers:  Knowledge Deficits related to disease management. Care Coordination needs related to disease management. Chronic Disease Management support and education needs related to chronic disease conditions-DM, HTN.  Patient has stopped smoking-uses Nicotene patch. Update 05/05/20:  Patient states he does not have glucometer or BP cuff. 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  Transportation barriers Update 05/05/20:  Patient states he has used Medicaid transportation before and has  contact information for [facility] transportation provided on 04/24/20. Non-adherence to prescribed medication regimen Difficulty obtaining medications Does not adhere to prescribed medication regimen  Nurse Case Manager Clinical Goal(s):  Over the next 7 days, patient will verbalize understanding of plan for obtaining medications and taking medications Over the next 30 days, patient will attend all scheduled medical appointments: Update 05/13/20: Patient has appointment 05/19/20 at [facility], patient reminded of appointment day and time.  Patient has transportation information. Over the next 14 days, patient will demonstrate improved adherence to prescribed treatment plan for disease management as evidenced by taking prescribed medications. Over the next 14  days, patient will demonstrate improved health management independence as evidenced by taking prescribed medications. Over the next 30 days, patient will verbalize basic understanding of disease process and self health management plan. Over  the next 30 days, the patient will demonstrate ongoing self health care management ability. Update 12/08/20:  Patient states he will call PCP today to schedule appointment.  Interventions:  Inter-disciplinary care team collaboration (see longitudinal plan of care) Evaluation of current treatment plan  and patient's adherence to plan as established by provider. Advised patient to contact provider as needed. Reviewed medications with patient  Collaborated with care guide regarding transportation and medications. Collaborated with Pharmacy regarding medications. Update 12/08/20:  Pharmacist following patient. Discussed plans with patient for ongoing care management follow up and provided patient with direct contact information for care management team Advised patient, providing education and rationale, to monitor blood pressure daily and record, calling provider for findings outside established parameters.  Provided patient and spouse  with information about transportation. Reviewed scheduled/upcoming provider appointments. Advised patient, providing education and rationale, to check cbg and record, calling provider  for findings outside established parameters.   Care Guide referral for transportation and medication needs. Update 05/05/20:  BSW attempted to call patient 05/04/20 to discuss needs with no success-rescheduled.  Patient Goals/Self-Care Activities Over the next 14 days, patient will:  -Self administers medications as prescribed Attends all scheduled provider appointments Calls pharmacy for medication refills Calls provider office for new concerns or questions  Follow Up Plan: RN Care Manager will follow up with patient within 30 days. The patient has been provided with contact information for the Managed Medicaid care management team and has been advised to call with any health related questions or concerns.       Follow Up:  Patient agrees to Care Plan and Follow-up.  Plan:  The Managed Medicaid care management team will reach out to the patient again over the next 30 days. and The patient has been provided with contact information for the Managed Medicaid care management team and has been advised to call with any health related questions or concerns.  Date/time of next scheduled RN care management/care coordination outreach: 12/25/20 at 230.

## 2020-12-08 NOTE — Patient Instructions (Signed)
Visit Information  Troy Mclaughlin was given information about Medicaid Managed Care team care coordination services as a part of their [place] Medicaid benefit. Troy Mclaughlin verbally consented to engagement with the [facility] Managed Care team.   For questions related to your [facility], please call: [phone] or visit the homepage here: [email or web address]  If you would like to schedule transportation through your [facility], please call the following number at least 2 days in advance of your appointment: [phone].   Call the [place] at [phone], at any time, 24 hours a day, 7 days a week. If you are in danger or need immediate medical attention call 911.  If you would like help to quit smoking, call 1-800-QUIT-NOW 731-219-9936) OR Espaol: 1-855-Djelo-Ya (6-283-662-9476) o para ms informacin haga clic aqu or Text READY to 200-400 to register via text  Troy Mclaughlin - following are the goals we discussed in your visit today:   Goals Addressed             This Visit's Progress    Protect My Health          - schedule appointment for vaccines needed due to my age or health - schedule recommended health tests - schedule and keep appointment for annual check-up   Update 12/08/20:  Patient states he is calling provider today.  Patient has upcoming appointments at [facility].          Patient verbalizes understanding of instructions provided today.   The Managed Medicaid care management team will reach out to the patient again over the next 30 days.  The  Patient has been provided with contact information for the Managed Medicaid care management team and has been advised to call with any health related questions or concerns.   Aida Raider RN, BSN [place]  [facility] [employer] -  Managed Medicaid High Risk [phone].    Following is a copy of your plan of care:  Patient Care Plan: General Plan of Care (Adult)     Problem Identified: Health Promotion or Disease Self-Management (General Plan of Care)   Priority: High  Onset Date: 05/22/2020     Long-Range Goal: Self-Management Plan Developed   Start Date: 04/24/2020  Expected End Date: 03/10/2021  Recent Progress: Not on track  Priority: High  Note:   Current Barriers:  Knowledge Deficits related to disease management. Care Coordination needs related to disease management. Chronic Disease Management support and education needs related to chronic disease conditions-DM, HTN. Update 05/05/20:  Patient states he does not have glucometer or BP cuff. 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  Transportation barriers Update 05/05/20:  Patient states he has used Medicaid transportation before and has contact information for [facility] transportation provided on 04/24/20. Non-adherence to prescribed medication regimen Difficulty obtaining medications Does not adhere to prescribed medication regimen  Nurse Case Manager Clinical Goal(s):  Over the next 7 days, patient will verbalize understanding of plan for obtaining medications and taking medications Over the next 30 days, patient will attend all scheduled medical appointments: Update 05/13/20: Patient has appointment 05/19/20 at [facility], patient reminded of appointment day and time.  Patient has transportation information. Over the next 14 days, patient will demonstrate improved adherence to prescribed treatment plan for disease management as evidenced by taking prescribed medications. Over the next 14  days, patient will demonstrate improved  health management independence as evidenced by taking prescribed medications. Over the next 30 days, patient will verbalize basic understanding of disease  process and self health management plan. Over the next 30 days, the patient will demonstrate ongoing self health care management ability. Update 12/08/20:  Patient states he will call PCP today to schedule appointment.  Interventions:  Inter-disciplinary care team collaboration (see longitudinal plan of care) Evaluation of current treatment plan  and patient's adherence to plan as established by provider. Advised patient to contact provider as needed. Reviewed medications with patient  Collaborated with care guide regarding transportation and medications. Collaborated with Pharmacy regarding medications. Update 12/08/20:  Pharmacist following patient. Discussed plans with patient for ongoing care management follow up and provided patient with direct contact information for care management team Advised patient, providing education and rationale, to monitor blood pressure daily and record, calling provider for findings outside established parameters.  Provided patient and spouse  with information about transportation. Reviewed scheduled/upcoming provider appointments. Advised patient, providing education and rationale, to check cbg and record, calling provider  for findings outside established parameters.   Care Guide referral for transportation and medication needs. Update 05/05/20:  BSW attempted to call patient 05/04/20 to discuss needs with no success-rescheduled.  Patient Goals/Self-Care Activities Over the next 14 days, patient will:  -Self administers medications as prescribed Attends all scheduled provider appointments Calls pharmacy for medication refills Calls provider office for new concerns or questions  Follow Up Plan: RN Care Manager will follow up with patient within 30 days. The patient has been provided with contact information for the Managed Medicaid care management team and has been advised to call with any health related questions or concerns.

## 2020-12-09 NOTE — Telephone Encounter (Signed)
Requested medication (s) are due for refill today:  yes  Requested medication (s) are on the active medication list: yes  Last refill:  11/13/2020  Future visit scheduled: no  Notes to clinic:  Patient was due for follow  up in April  Review for refill    Requested Prescriptions  Pending Prescriptions Disp Refills   nitroGLYCERIN (NITROSTAT) 0.4 MG SL tablet [Pharmacy Med Name: nitroglycerin 0.4 mg sublingual tablet] 25 tablet 1    Sig: DISSOLVE 1 TABLET UNDER THE TONGUE EVERY 5 MINUTES AS NEEDED FOR CHEST PAIN. DO NOT EXCEED A TOTAL OF 3 DOSES IN 15 MINUTES.      Cardiovascular:  Nitrates Failed - 12/08/2020  4:47 PM      Failed - Last BP in normal range    BP Readings from Last 1 Encounters:  12/02/20 (!) 147/99          Passed - Last Heart Rate in normal range    Pulse Readings from Last 1 Encounters:  12/02/20 60          Passed - Valid encounter within last 12 months    Recent Outpatient Visits           6 months ago Essential hypertension   [place], [place]   8 months ago Type 2 diabetes mellitus without complication, without long-term current use of insulin ([place])   [place], Deborah B, MD   1 year ago Constipation, unspecified constipation type   [place], [place], [place]   1 year ago Constipation, unspecified constipation type   [place]   2 years ago Coronary 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 [facility])   [place], Dalbert Batman, MD                  atorvastatin (LIPITOR) 10 MG tablet [Pharmacy Med Name: atorvastatin 10 mg tablet] 30 tablet 1    Sig: TAKE ONE TABLET BY MOUTH EVERY MORNING      Cardiovascular:  Antilipid - Statins Failed - 12/08/2020  4:47 PM      Failed - Total Cholesterol in normal range and within 360  days    Cholesterol  Date Value Ref Range Status  10/17/2014 131 0 - 200 mg/dL Final    Comment:    ATP III Classification       Desirable:  < 200 mg/dL               Borderline High:  200 - 239 mg/dL          High:  > = 240 mg/dL          Failed - LDL in normal range and within 360 days    LDL Cholesterol  Date Value Ref Range Status  10/17/2014 80 0 - 99 mg/dL Final          Failed - HDL in normal range and within 360 days    HDL  Date Value Ref Range Status  10/17/2014 40.80 >39.00 mg/dL Final          Failed - Triglycerides in normal range and within 360 days    Triglycerides  Date Value Ref Range Status  10/17/2014 52.0 0.0 - 149.0 mg/dL Final    Comment:    Normal:  <150 mg/dLBorderline High:  150 -  199 mg/dL          Passed - Patient is not pregnant      Passed - Valid encounter within last 12 months    Recent Outpatient Visits           6 months ago Essential hypertension   [place], MD   8 months ago Type 2 diabetes mellitus without complication, without long-term current use of insulin ([place])   [place], Deborah B, MD   1 year ago Constipation, unspecified constipation type   [place], [place], [place]   1 year ago Constipation, unspecified constipation type   [place]   2 years ago Coronary 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 [facility])   [facility] [name], MD

## 2020-12-10 ENCOUNTER — Other Ambulatory Visit: Payer: Medicaid Other

## 2020-12-10 NOTE — Patient Outreach (Signed)
Compliant on meds, due for refill on 12/17/20  Tamsulosin   0.4 mg  Amlodipine   5 mg  Carvedilol   12.5 mg       Atorvastatin   10 mg   Will deliver next week and f/u 1 month

## 2020-12-15 ENCOUNTER — Ambulatory Visit
Admission: RE | Admit: 2020-12-15 | Discharge: 2020-12-15 | Disposition: A | Discharge status: Home / Self Care | Payer: Medicaid Other | Source: Ambulatory Visit | Attending: Radiation Oncology | Admitting: Radiation Oncology

## 2020-12-15 ENCOUNTER — Other Ambulatory Visit: Payer: Self-pay

## 2020-12-15 VITALS — BP 164/98 | HR 75 | Temp 97.9°F | Resp 20 | Ht 69.0 in | Wt 127.8 lb

## 2020-12-15 VITALS — BP 164/98 | HR 75 | Temp 97.9°F | Resp 20 | Wt 127.5 lb

## 2020-12-15 DIAGNOSIS — I252 Old myocardial infarction: Secondary | ICD-10-CM | POA: Insufficient documentation

## 2020-12-15 DIAGNOSIS — C61 Malignant neoplasm of prostate: Secondary | ICD-10-CM | POA: Diagnosis not present

## 2020-12-15 DIAGNOSIS — K219 Gastro-esophageal reflux disease without esophagitis: Secondary | ICD-10-CM | POA: Insufficient documentation

## 2020-12-15 DIAGNOSIS — F1010 Alcohol abuse, uncomplicated: Secondary | ICD-10-CM | POA: Insufficient documentation

## 2020-12-15 DIAGNOSIS — I10 Essential (primary) hypertension: Secondary | ICD-10-CM | POA: Diagnosis not present

## 2020-12-15 DIAGNOSIS — F17210 Nicotine dependence, cigarettes, uncomplicated: Secondary | ICD-10-CM | POA: Insufficient documentation

## 2020-12-15 DIAGNOSIS — Z8711 Personal history of peptic ulcer disease: Secondary | ICD-10-CM | POA: Insufficient documentation

## 2020-12-15 DIAGNOSIS — Z800 Family history of malignant neoplasm of digestive organs: Secondary | ICD-10-CM | POA: Diagnosis not present

## 2020-12-15 DIAGNOSIS — Z79899 Other long term (current) drug therapy: Secondary | ICD-10-CM | POA: Insufficient documentation

## 2020-12-15 DIAGNOSIS — R972 Elevated prostate specific antigen [PSA]: Secondary | ICD-10-CM | POA: Diagnosis not present

## 2020-12-15 NOTE — Progress Notes (Signed)
GU Location of Tumor / Histology: Prostate  If Prostate Cancer, Gleason Score is (5 + 4), PSA (113), and Prostate volume (42g)  Troy Mclaughlin presented  months ago with signs/symptoms of:   Biopsies revealed:  Media Information          Document Information  Pathology      Attached To:       Source Information      Past/Anticipated interventions by urology, if any:   Past/Anticipated interventions by medical oncology, if any:   Weight changes, if any: Lost 40 pounds in a year  IPSS Score: 22 SHIM Score:1  Bowel/Bladder complaints, if any: no   Nausea/Vomiting, if any: yes  Pain issues, if any:  yes in abdomen  SAFETY ISSUES: Prior radiation? Yes in abdomen area Pacemaker/ICD? no Possible current pregnancy? no Is the patient on methotrexate? no  Current Complaints / other details:   Vitals:   12/15/20 1104  BP: (!) 164/98  Pulse: 75  Resp: 20  Temp: 97.9 F (36.6 C)  TempSrc: Temporal  SpO2: 100%  Weight: 57.8 kg   Patient states that he is having headaches .
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
## 2020-12-16 ENCOUNTER — Encounter: Payer: Self-pay | Admitting: Licensed Clinical Social Worker

## 2020-12-16 NOTE — Progress Notes (Signed)
[place] Psychosocial Distress Screening Clinical Social Work  Clinical Social Work was referred by distress screening protocol.  The patient scored a 9 on the Psychosocial Distress Thermometer which indicates severe distress. Clinical Social Worker contacted patient by phone to assess for distress and other psychosocial needs.   Patient's major stressor is transportation. He receives a transportation benefit through his insurance [facility]). CSW provided contact information today to assist with rides to appointments. Patient is pursuing surgery first, so will not start with treatment at [facility]. CSW encouraged patient to reach out if he does have tx at [facility] and needs any further support.  ONCBCN DISTRESS SCREENING 12/15/2020  Screening Type Initial Screening  Distress experienced in past week (1-10) 9  Practical problem type Transportation    Clinical Social Worker follow up needed: No.  If yes, follow up plan:  Miroslava Santellan E Fiora Weill, LCSW

## 2020-12-22 DIAGNOSIS — R350 Frequency of micturition: Secondary | ICD-10-CM | POA: Diagnosis not present

## 2020-12-22 DIAGNOSIS — N200 Calculus of kidney: Secondary | ICD-10-CM | POA: Diagnosis not present

## 2020-12-22 DIAGNOSIS — R310 Gross hematuria: Secondary | ICD-10-CM | POA: Diagnosis not present

## 2020-12-22 DIAGNOSIS — C61 Malignant neoplasm of prostate: Secondary | ICD-10-CM | POA: Diagnosis not present

## 2020-12-25 ENCOUNTER — Other Ambulatory Visit: Payer: Self-pay

## 2020-12-25 ENCOUNTER — Other Ambulatory Visit: Payer: Self-pay | Admitting: Obstetrics and Gynecology

## 2020-12-25 NOTE — Patient Outreach (Signed)
Medicaid Managed Care   Nurse Care Manager Note  12/25/2020 Name:  Troy Mclaughlin MRN:  [id number] DOB:  05/21/1956  Troy Mclaughlin is an 65 y.o. year old male who is a primary patient of Troy Pier, MD.  The Medicaid Managed Care Coordination team was consulted for assistance with:    Chronic healthcare management needs  [name] was given information about Medicaid Managed Care Coordination team services today. Troy Mclaughlin agreed to services and verbal consent obtained.  Engaged with patient by telephone for follow up visit in response to provider referral for case management and/or care coordination services.   Assessments/Interventions:  Review of past medical history, allergies, medications, health status, including review of consultants reports, laboratory and other test data, was performed as part of comprehensive evaluation and provision of chronic care management services.  SDOH (Social Determinants of Health) assessments and interventions performed: SDOH Interventions    Flowsheet Row Most Recent Value  SDOH Interventions   SDOH Interventions for the Following Domains Transportation  Food Insecurity Interventions Intervention Not Indicated  Housing Interventions Intervention Not Indicated  Intimate Partner Violence Interventions Intervention Not Indicated  Physical Activity Interventions Intervention Not Indicated  Stress Interventions Intervention Not Indicated  Social Connections Interventions Intervention Not Indicated  Transportation Interventions Other (Comment)  [PATIENT HAS [place] AND iNSURANCE TRANSPORTATION INFORMATION]       Care Plan  Allergies  Allergen Reactions   Lisinopril Anaphylaxis and Swelling    angioedema   Nsaids Other (See Comments)    Bleeding ulcers    Medications Reviewed Today     Reviewed by Troy Medicus, RN (Registered Nurse) on 12/25/20 at [place] List Status: <None>   Medication Order Taking? Sig Documenting  Provider Last Dose Status Informant  ACCU-CHEK FASTCLIX LANCETS [place] [id number]  Use as directed once daily  Patient not taking: Reported on 12/15/2020   Troy Pier, MD  Active   amLODipine (NORVASC) 5 MG tablet [id number] Yes Take 1 tablet (5 mg total) by mouth daily. Troy Pier, MD Taking Active   atorvastatin (LIPITOR) 10 MG tablet [id number] Yes TAKE ONE TABLET BY MOUTH EVERY MORNING Troy Pier, MD Taking Active   Blood Glucose Monitoring Suppl (ACCU-CHEK GUIDE) w/Device KIT [id number]  1 each by Does not apply route daily.  Patient not taking: Reported on 12/15/2020   Troy Pier, MD  Active Multiple Informants  carvedilol (COREG) 12.5 MG tablet [id number] Yes Take 0.5 tablets (6.25 mg total) by mouth 2 (two) times daily with a meal. Troy Pier, MD Taking Active            Med Note Orvan Seen, Troy Mclaughlin   Fri Nov 13, 2020  9:43 PM) Pt states that he does not cut these in half - taking one tablet once daily  glucose blood (ACCU-CHEK GUIDE) test strip [id number]  Use as instructed to test blood sugar once daily  Patient not taking: Reported on 12/15/2020   Troy Pier, MD  Active   naproxen sodium (ALEVE) 220 MG tablet [id number] No Take 440 mg by mouth 2 (two) times daily as needed (pain).  Patient not taking: No sig reported   [provider] Not Taking Active   nicotine (NICODERM CQ - DOSED IN MG/24 HOURS) 14 mg/24hr patch [id number] Yes Place 1 patch (14 mg total) onto the skin daily. Troy Pier, MD Taking Active   nitroGLYCERIN (NITROSTAT) 0.4 MG SL tablet [id number]  Place 1 tablet (0.4  mg total) under the tongue every 5 (five) minutes as needed for chest pain. Troy Pier, MD  Active   pantoprazole (PROTONIX) 40 MG tablet [id number] No Take 1 tablet (40 mg total) by mouth daily.  Patient not taking: No sig reported   Sherwood Gambler, MD Not Taking Active   polyvinyl alcohol (LIQUIFILM TEARS) 1.4 % ophthalmic solution [id number] Yes  Place 1 drop into both eyes daily as needed for dry eyes. [provider] Taking Active   tamsulosin (FLOMAX) 0.4 MG CAPS capsule [id number] No Take 1 capsule (0.4 mg total) by mouth daily.  Patient not taking: No sig reported   Troy Pier, MD Not Taking Active            Med Note Orvan Seen, Troy Mclaughlin   Fri Nov 13, 2020  9:42 PM) Refill to be delivered Tuesday - no record of this being filled in past 6 months  traMADol (ULTRAM) 50 MG tablet [id number] No Take 1 tablet (50 mg total) by mouth daily as needed.  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[place])    Diabetes mellitus without complication ([place])    Alcohol abuse    Acute gastric ulcer with hemorrhage    GI bleed 03/30/2019   Abdominal pain, left lower quadrant    Blood in the stool    Alcoholic gastritis 32/20/2542   Non-intractable vomiting 05/27/2018   Hyperkalemia 05/27/2018   Erectile dysfunction 02/09/2017   Tobacco dependence 02/09/2017   Hyperlipidemia 01/18/2017   Gastroesophageal reflux disease with esophagitis    Renal stone    Type 2 diabetes mellitus without complication, without long-term current use of insulin ([place]) 08/16/2016   Abdominal wall pain    Coronary artery disease involving native coronary artery of native heart without angina pectoris    Atypical chest pain    Essential hypertension    History of ST elevation myocardial infarction (STEMI) 07/06/2013   History of small bowel obstruction 07/29/2008   GLAUCOMA 04/15/2008     Conditions to be addressed/monitored per PCP order:   chronic healthcare management needs,  GERD, DM, prostate cancer, HTN  Care Plan : General Plan of Care (Adult)  Updates made by Troy Medicus, RN since 12/25/2020 12:00 AM     Problem: Health Promotion or Disease Self-Management (General Plan of Care)   Priority: High  Onset Date: 05/22/2020     Long-Range Goal: Self-Management Plan Developed   Start Date: 04/24/2020  Expected End Date: 03/10/2021  Recent Progress: Not on track  Priority: High  Note:   Current Barriers:  Knowledge Deficits related to disease management. Care Coordination needs related to disease management. Chronic Disease Management support and education needs related to chronic disease conditions-DM, HTN, prostate cancer, GERD, CAD Update 05/05/20:  Patient states he does not have glucometer or BP cuff. 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  Transportation barriers  Update 05/05/20:  Patient states he has used Medicaid transportation before and has contact information for [facility] transportation provided on 04/24/20. Non-adherence to prescribed medication regimen Difficulty obtaining medications Does not adhere to prescribed medication regimen  Nurse Case Manager Clinical Goal(s):  Over the next 7 days, patient will verbalize understanding of plan for obtaining medications and taking medications Over the next 30 days, patient will attend all scheduled medical appointments: Update 05/13/20: Patient has appointment 05/19/20 at [facility], patient reminded of appointment day and time.  Patient has transportation information. Over the next 14 days, patient will demonstrate improved adherence to prescribed treatment plan for disease management as evidenced by taking prescribed medications. Over the next 14  days, patient will demonstrate improved health management independence as evidenced by taking  prescribed medications. Over the next 30 days, patient will verbalize basic understanding of disease process and self health management plan. Over the next 30 days, the patient will demonstrate ongoing self health care management ability. Update 12/08/20:  Patient states he will call PCP today to schedule appointment. Update 12/25/20:  Patient being followed by cancer center.  Interventions:  Inter-disciplinary care team collaboration (see longitudinal plan of care) Evaluation of current treatment plan  and patient's adherence to plan as established by provider. Advised patient to contact provider as needed. Reviewed medications with patient  Collaborated with care guide regarding transportation and medications. Collaborated with Pharmacy regarding medications. Update 12/08/20:  Pharmacist following patient. Discussed plans with patient for ongoing care management follow up and provided patient with direct contact information for care management team Advised patient, providing education and rationale, to monitor blood pressure daily and record, calling provider for findings outside established parameters.  Provided patient and spouse  with information about transportation. Reviewed scheduled/upcoming provider appointments. Advised patient, providing education and rationale, to check cbg and record, calling provider  for findings outside established parameters.   Care Guide referral for transportation and medication needs. Update 05/05/20:  BSW attempted to call patient 05/04/20 to discuss needs with no success-rescheduled.  Patient Goals/Self-Care Activities Over the next 14 days, patient will:  -Self administers medications as prescribed Attends all scheduled provider appointments Calls pharmacy for medication refills Calls provider office for new concerns or questions  Follow Up Plan: RN Care Manager will follow up with patient within 30 days. The patient has been provided with contact  information for the Managed Medicaid care management team and has been advised to call with any health related questions or concerns.        Follow Up:  Patient agrees to Care Plan and Follow-up.  Plan: The Managed Medicaid care management team will reach out to the patient again over the next 30 days. and The patient has been provided with contact information for the Managed Medicaid care management team and has been advised to call with any health related questions or concerns.  Date/time of next scheduled RN care management/care coordination outreach: 01/20/21 at 1245.

## 2020-12-25 NOTE — Patient Instructions (Signed)
Visit Information  Troy Mclaughlin was given information about Medicaid Managed Care team care coordination services as a part of their [place] Medicaid benefit. Troy Mclaughlin verbally consented to engagement with the [facility] Managed Care team.   If you are experiencing a medical emergency, please call 911 or report to your local emergency department or urgent care.   If you have a non-emergency medical problem during routine business hours, please contact your provider's office and ask to speak with a nurse.   For questions related to your [facility], please call: [phone] or visit the homepage here: [email or web address]  If you would like to schedule transportation through your [facility], please call the following number at least 2 days in advance of your appointment: [phone].   Call the [place] at [phone], at any time, 24 hours a day, 7 days a week. If you are in danger or need immediate medical attention call 911.  If you would like help to quit smoking, call 1-800-QUIT-NOW (336)162-6450) OR Espaol: 1-855-Djelo-Ya HD:1601594) o para ms informacin haga clic aqu or Text READY to 200-400 to register via text  Troy Mclaughlin - following are the goals we discussed in your visit today:   Goals Addressed             This Visit's Progress    Protect My Health         - schedule appointment for vaccines needed due to my age or health - schedule recommended health tests - schedule and keep appointment for annual check-up   Update 12/08/20:  Patient states he is calling provider today.  Patient has upcoming appointments at [facility]. Update 12/25/20:  Patient seen at [facility], awaiting prostatectomy to be scheduled.     Patient verbalizes understanding of instructions provided today.   The Managed Medicaid care  management team will reach out to the patient again over the next 30 days.  The  Patient  has been provided with contact information for the Managed Medicaid care management team and has been advised to call with any health related questions or concerns.   Aida Raider RN, BSN [place]  [facility] - Managed Medicaid High Risk [phone].   Following is a copy of your plan of care:  Patient Care Plan: General Plan of Care (Adult)     Problem Identified: Health Promotion or Disease Self-Management (General Plan of Care)   Priority: High  Onset Date: 05/22/2020     Long-Range Goal: Self-Management Plan Developed   Start Date: 04/24/2020  Expected End Date: 03/10/2021  Recent Progress: Not on track  Priority: High  Note:   Current Barriers:  Knowledge Deficits related to disease management. Care Coordination needs related to disease management. Chronic Disease Management support and education needs related to chronic disease conditions-DM, HTN, prostate cancer, GERD, CAD Update 05/05/20:  Patient states he does not have glucometer or BP cuff. 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 Non-adherence to prescribed medication regimen Difficulty obtaining medications Does not adhere to prescribed medication regimen  Nurse Case Manager Clinical Goal(s):  Over the next 7 days, patient will verbalize understanding of plan for obtaining medications and taking medications Over the next 30 days, patient will attend all  scheduled medical appointments: Update 05/13/20: Patient has appointment 05/19/20 at [facility], patient reminded of appointment day and time.  Patient has transportation information. Over the  next 14 days, patient will demonstrate improved adherence to prescribed treatment plan for disease management as evidenced by taking prescribed medications. Over the next 14  days, patient will demonstrate improved health management independence as evidenced by taking prescribed medications. Over the next 30 days, patient will verbalize basic understanding of disease process and self health management plan. Over the next 30 days, the patient will demonstrate ongoing self health care management ability. Update 12/08/20:  Patient states he will call PCP today to schedule appointment. Update 12/25/20:  Patient being followed by cancer center.  Interventions:  Inter-disciplinary care team collaboration (see longitudinal plan of care) Evaluation of current treatment plan  and patient's adherence to plan as established by provider. Advised patient to contact provider as needed. Reviewed medications with patient  Collaborated with care guide regarding transportation and medications. Collaborated with Pharmacy regarding medications. Update 12/08/20:  Pharmacist following patient. Discussed plans with patient for ongoing care management follow up and provided patient with direct contact information for care management team Advised patient, providing education and rationale, to monitor blood pressure daily and record, calling provider for findings outside established parameters.  Provided patient and spouse  with information about transportation. Reviewed scheduled/upcoming provider appointments. Advised patient, providing education and rationale, to check cbg and record, calling provider  for findings outside established parameters.   Care Guide referral for transportation and medication needs. Update 05/05/20:  BSW attempted to call patient 05/04/20 to discuss needs with no success-rescheduled.  Patient Goals/Self-Care Activities Over the next 14 days, patient will:  -Self administers medications as  prescribed Attends all scheduled provider appointments Calls pharmacy for medication refills Calls provider office for new concerns or questions  Follow Up Plan: RN Care Manager will follow up with patient within 30 days. The patient has been provided with contact information for the Managed Medicaid care management team and has been advised to call with any health related questions or concerns.

## 2020-12-29 ENCOUNTER — Other Ambulatory Visit: Payer: Self-pay

## 2020-12-29 ENCOUNTER — Emergency Department (HOSPITAL_COMMUNITY)
Admission: EM | Admit: 2020-12-29 | Discharge: 2020-12-30 | Disposition: A | Discharge status: Home / Self Care | Payer: Medicaid Other | Attending: Emergency Medicine | Admitting: Emergency Medicine

## 2020-12-29 ENCOUNTER — Encounter (HOSPITAL_COMMUNITY): Payer: Self-pay

## 2020-12-29 DIAGNOSIS — I2510 Atherosclerotic heart disease of native coronary artery without angina pectoris: Secondary | ICD-10-CM | POA: Diagnosis not present

## 2020-12-29 DIAGNOSIS — E119 Type 2 diabetes mellitus without complications: Secondary | ICD-10-CM | POA: Insufficient documentation

## 2020-12-29 DIAGNOSIS — R109 Unspecified abdominal pain: Secondary | ICD-10-CM | POA: Diagnosis not present

## 2020-12-29 DIAGNOSIS — R112 Nausea with vomiting, unspecified: Secondary | ICD-10-CM

## 2020-12-29 DIAGNOSIS — K921 Melena: Secondary | ICD-10-CM | POA: Diagnosis not present

## 2020-12-29 DIAGNOSIS — R42 Dizziness and giddiness: Secondary | ICD-10-CM | POA: Diagnosis not present

## 2020-12-29 DIAGNOSIS — Z79899 Other long term (current) drug therapy: Secondary | ICD-10-CM | POA: Diagnosis not present

## 2020-12-29 DIAGNOSIS — R1111 Vomiting without nausea: Secondary | ICD-10-CM | POA: Diagnosis not present

## 2020-12-29 DIAGNOSIS — I10 Essential (primary) hypertension: Secondary | ICD-10-CM | POA: Insufficient documentation

## 2020-12-29 DIAGNOSIS — F17210 Nicotine dependence, cigarettes, uncomplicated: Secondary | ICD-10-CM | POA: Insufficient documentation

## 2020-12-29 DIAGNOSIS — Z8546 Personal history of malignant neoplasm of prostate: Secondary | ICD-10-CM | POA: Diagnosis not present

## 2020-12-29 DIAGNOSIS — R1031 Right lower quadrant pain: Secondary | ICD-10-CM | POA: Diagnosis not present

## 2020-12-29 DIAGNOSIS — K920 Hematemesis: Secondary | ICD-10-CM | POA: Insufficient documentation

## 2020-12-29 DIAGNOSIS — R079 Chest pain, unspecified: Secondary | ICD-10-CM | POA: Insufficient documentation

## 2020-12-29 DIAGNOSIS — R0902 Hypoxemia: Secondary | ICD-10-CM | POA: Diagnosis not present

## 2020-12-29 DIAGNOSIS — R519 Headache, unspecified: Secondary | ICD-10-CM | POA: Insufficient documentation

## 2020-12-29 LAB — COMPREHENSIVE METABOLIC PANEL
ALT: 16 U/L (ref 0–44)
AST: 24 U/L (ref 15–41)
Albumin: 3.5 g/dL (ref 3.5–5.0)
Alkaline Phosphatase: 71 U/L (ref 38–126)
Anion gap: 10 (ref 5–15)
BUN: 6 mg/dL — ABNORMAL LOW (ref 8–23)
CO2: 26 mmol/L (ref 22–32)
Calcium: 8.5 mg/dL — ABNORMAL LOW (ref 8.9–10.3)
Chloride: 99 mmol/L (ref 98–111)
Creatinine, Ser: 0.78 mg/dL (ref 0.61–1.24)
GFR, Estimated: >60 mL/min (ref >60)
Glucose, Bld: 84 mg/dL (ref 70–99)
Potassium: 3.2 mmol/L — ABNORMAL LOW (ref 3.5–5.1)
Sodium: 135 mmol/L (ref 135–145)
Total Bilirubin: 0.3 mg/dL (ref 0.3–1.2)
Total Protein: 6.5 g/dL (ref 6.5–8.1)

## 2020-12-29 LAB — CBC WITH DIFFERENTIAL/PLATELET
Abs Immature Granulocytes: 0.01 10*3/uL (ref 0.00–0.07)
Basophils Absolute: 0 10*3/uL (ref 0.0–0.1)
Basophils Relative: 1 %
Eosinophils Absolute: 0.1 10*3/uL (ref 0.0–0.5)
Eosinophils Relative: 4 %
HCT: 35.5 % — ABNORMAL LOW (ref 39.0–52.0)
Hemoglobin: 12 g/dL — ABNORMAL LOW (ref 13.0–17.0)
Immature Granulocytes: 0 %
Lymphocytes Relative: 35 %
Lymphs Abs: 1.3 10*3/uL (ref 0.7–4.0)
MCH: 25.6 pg — ABNORMAL LOW (ref 26.0–34.0)
MCHC: 33.8 g/dL (ref 30.0–36.0)
MCV: 75.7 fL — ABNORMAL LOW (ref 80.0–100.0)
Monocytes Absolute: 0.6 10*3/uL (ref 0.1–1.0)
Monocytes Relative: 17 %
Neutro Abs: 1.6 10*3/uL — ABNORMAL LOW (ref 1.7–7.7)
Neutrophils Relative %: 43 %
Platelets: 158 10*3/uL (ref 150–400)
RBC: 4.69 MIL/uL (ref 4.22–5.81)
RDW: 15.5 % (ref 11.5–15.5)
WBC: 3.6 10*3/uL — ABNORMAL LOW (ref 4.0–10.5)
nRBC: 0 % (ref 0.0–0.2)

## 2020-12-29 LAB — LIPASE, BLOOD: Lipase: 28 U/L (ref 11–51)

## 2020-12-29 LAB — POC OCCULT BLOOD, ED: Fecal Occult Bld: NEGATIVE

## 2020-12-29 LAB — ETHANOL: Alcohol, Ethyl (B): 140 mg/dL — ABNORMAL HIGH (ref <10)

## 2020-12-29 LAB — PROTIME-INR
INR: 1 (ref 0.8–1.2)
Prothrombin Time: 13.2 seconds (ref 11.4–15.2)

## 2020-12-29 LAB — TYPE AND SCREEN
ABO/RH(D): O POS
Antibody Screen: NEGATIVE

## 2020-12-29 MED ORDER — SODIUM CHLORIDE 0.9 % IV BOLUS
500.0000 mL | Freq: Once | INTRAVENOUS | Status: AC
  Administered 2020-12-29: 500 mL via INTRAVENOUS

## 2020-12-29 MED ORDER — HYDROCODONE-ACETAMINOPHEN 5-325 MG PO TABS: 1.0000 | ORAL_TABLET | Freq: Four times a day (QID) | ORAL | 0 refills | Status: DC | PRN

## 2020-12-29 MED ORDER — PANTOPRAZOLE INFUSION (NEW) - SIMPLE MED
8.0000 mg/h | INTRAVENOUS | Status: DC
  Administered 2020-12-29: 8 mg/h via INTRAVENOUS
  Filled 2020-12-29 (×2): qty 100

## 2020-12-29 MED ORDER — MORPHINE SULFATE (PF) 4 MG/ML IV SOLN: 4.0000 mg | Freq: Once | INTRAVENOUS | Status: DC

## 2020-12-29 MED ORDER — PANTOPRAZOLE 80MG IVPB - SIMPLE MED
80.0000 mg | Freq: Once | INTRAVENOUS | Status: AC
  Administered 2020-12-29: 80 mg via INTRAVENOUS
  Filled 2020-12-29: qty 80

## 2020-12-29 MED ORDER — ONDANSETRON HCL 4 MG PO TABS: 4.0000 mg | ORAL_TABLET | Freq: Three times a day (TID) | ORAL | 0 refills | Status: DC | PRN

## 2020-12-29 NOTE — ED Notes (Signed)
Pt has been d/c but unable to find a ride until the am. Pt does not qualify for PTAR. CN and ERP made aware.

## 2020-12-29 NOTE — ED Triage Notes (Signed)
Per EEM, pt coming from home. C/o black tarru stools and bright red emesis x 3 days. Pt also c/o cp, abd pain and sob. Pt also mildly confused. EMS unsure if pt has hx of dementia. Pt given 500 ml bolus pta and 100 mcg of fentanyl.

## 2020-12-29 NOTE — Discharge Instructions (Addendum)
You are seen emergency department for evaluation of vomiting blood and having a headache, dizziness, abdominal pain.  Your blood counts were stable.  We are providing you with a prescription for some pain medication and nausea medication.  Please contact your urologist and your primary care doctor for close follow-up.  Please moderate your alcohol consumption.  Return to the emergency department if any worsening or concerning symptoms
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
## 2020-12-30 NOTE — ED Notes (Signed)
[facility] contacted to help patient get home.  ETA is 8 mins.

## 2020-12-31 ENCOUNTER — Telehealth: Payer: Self-pay

## 2020-12-31 NOTE — Telephone Encounter (Signed)
Transition Care Management Unsuccessful Follow-up Telephone Call  Date of discharge and from where:  12/30/2020-[facility]  Attempts:  1st Attempt  Reason for unsuccessful TCM follow-up call:  Left voice message

## 2021-01-01 NOTE — Telephone Encounter (Signed)
Transition Care Management Unsuccessful Follow-up Telephone Call  Date of discharge and from where:  12/30/2020-[facility]  Attempts:  2nd Attempt  Reason for unsuccessful TCM follow-up call:  Left voice message

## 2021-01-04 NOTE — Telephone Encounter (Signed)
Transition Care Management Unsuccessful Follow-up Telephone Call  Date of discharge and from where:  12/30/2020-[facility]  Attempts:  3rd Attempt  Reason for unsuccessful TCM follow-up call:  Unable to reach patient

## 2021-01-07 ENCOUNTER — Encounter: Payer: Self-pay | Admitting: Licensed Clinical Social Worker

## 2021-01-07 ENCOUNTER — Telehealth: Payer: Self-pay | Admitting: Internal Medicine

## 2021-01-07 NOTE — Telephone Encounter (Signed)
Copied from [place] [phone]. Topic: General - Other >> Jan 07, 2021 11:26 AM Leward Quan A wrote: Reason for CRM: Patient called in asking to speak to Dr Wynetta Emery stated that he was diagnosed with stage 4 cancer and in need of his medication which is too expensive. [place] if Dr Wynetta Emery can please call him back today at Ph# [phone]

## 2021-01-07 NOTE — Telephone Encounter (Signed)
Will forward to provider  

## 2021-01-07 NOTE — Progress Notes (Signed)
[place] [employer]  CSW received transferred phone call from RN regarding patient who is thinking of hurting himself.  In speaking with pt, he stated that he has been unable to fill his medications and is in pain which makes him think about hurting himself. CSW tried to determine what medication he is needing and who it was prescribed by. Pt unable to identify medication. States he tried reaching Dr. Wynetta Emery (PCP) unsuccessfully and that meds were prescribed while at the ED last week.  As patient is not being treated at [facility], [place] offered to try contacting PCP but pt stated he has already tried that and needs the medication. CSW recommended going to ED for further evaluation of physical and mental health needs. Patient stated he will call someone to find a ride.   Christeen Douglas, LCSW

## 2021-01-08 NOTE — Telephone Encounter (Signed)
Returned pt call pt didn't answer lvm  

## 2021-01-13 ENCOUNTER — Ambulatory Visit: Payer: Medicaid Other | Attending: Physician Assistant | Admitting: Physician Assistant

## 2021-01-13 ENCOUNTER — Other Ambulatory Visit: Payer: Self-pay

## 2021-01-13 NOTE — Progress Notes (Deleted)
Patient ID: Troy Mclaughlin, male   DOB: Oct 03, 1955, 65 y.o.   MRN: [id number]   After visit to ED 12/29/2020 for N/V.  Heme neg stools.  H/o prostate CA.    From ED A/P: This patient complains of vomiting blood melena headache abdominal pain poor p.o. intake; this involves an extensive number of treatment Options and is a complaint that carries with it a high risk of complications and Morbidity. The differential includes GI bleed, gastritis, peptic ulcer disease, cancer pain, metabolic derangement   I ordered, reviewed and interpreted labs, which included CBC with mildly low white count, similar to prior, hemoglobin low but stable, chemistries fairly unremarkable other than mildly low potassium 3.2, coags normal, alcohol elevated at 140, fecal occult negative I ordered medication IV PPI   Previous records obtained and reviewed in epic, patient here last month for alcohol intoxication confusion and chronic abdominal pain   After the interventions stated above, I reevaluated the patient and found patient be resting comfortably hemodynamically stable.  He is tolerating p.o. here.  He has had no vomiting and no overt bleeding.  Hemoglobin stable and BUN and creatinine normal so doubt significant GI bleed.  Currently no indications for admission to the hospital.  Patient counseled to moderate alcohol intake and follow-up with his treating providers.

## 2021-01-14 NOTE — Telephone Encounter (Signed)
Pt called back and was still not sure which meds he needed- mentioned the pain meds and was advised that the [place] was prescribed by Dr Melina Copa and could not be refilled by Dr Wynetta Emery at this time. Please advise.

## 2021-01-14 NOTE — Telephone Encounter (Signed)
Returned pt call pt didn't answer lvm  

## 2021-01-20 ENCOUNTER — Other Ambulatory Visit: Payer: Self-pay | Admitting: Obstetrics and Gynecology

## 2021-01-20 NOTE — Patient Outreach (Signed)
Care Coordination  01/20/2021  [name] 27-Feb-1956 [id number]   Medicaid Managed Care   Unsuccessful Outreach Note  01/20/2021 Name: Troy Mclaughlin MRN: [id number] DOB: 07-28-1955  Referred by: [name], MD Reason for referral : High Risk Managed Medicaid (Unsuccessful telephone outreach)   An unsuccessful telephone outreach was attempted today. The patient was referred to the case management team for assistance with care management and care coordination.   Follow Up Plan: The care management team will reach out to the patient again over the next 7-14 days.   Aida Raider RN, BSN   [facility] - Managed Medicaid High Risk [phone].

## 2021-01-20 NOTE — Patient Instructions (Signed)
Visit Information  [name]  - as a part of your Medicaid benefit, you are eligible for care management and care coordination services at no cost or copay. I was unable to reach you by phone today but would be happy to help you with your health related needs. Please feel free to call me at [phone].  A member of the Managed Medicaid care management team will reach out to you again over the next 7-14 days.   Aida Raider RN, BSN [place]  [facility] Managed Medicaid High Risk [phone].

## 2021-02-02 ENCOUNTER — Other Ambulatory Visit: Payer: Self-pay | Admitting: Internal Medicine

## 2021-02-02 ENCOUNTER — Telehealth: Payer: Self-pay | Admitting: *Deleted

## 2021-02-02 DIAGNOSIS — I10 Essential (primary) hypertension: Secondary | ICD-10-CM

## 2021-02-02 DIAGNOSIS — R310 Gross hematuria: Secondary | ICD-10-CM | POA: Diagnosis not present

## 2021-02-02 NOTE — Telephone Encounter (Signed)
I called his daughter at [phone] [place].   His chart lists him as coordinating his care and to contact her.    I left a voicemail to call [facility] and make a 6 month check up appt. For his medication refills.  I gave him a 30 day courtesy refill on his amlodipine and Coreg.

## 2021-02-03 ENCOUNTER — Other Ambulatory Visit: Payer: Self-pay | Admitting: Internal Medicine

## 2021-02-03 ENCOUNTER — Other Ambulatory Visit (HOSPITAL_COMMUNITY): Payer: Self-pay | Admitting: Urology

## 2021-02-03 DIAGNOSIS — I2510 Atherosclerotic heart disease of native coronary artery without angina pectoris: Secondary | ICD-10-CM

## 2021-02-03 DIAGNOSIS — C61 Malignant neoplasm of prostate: Secondary | ICD-10-CM

## 2021-02-03 DIAGNOSIS — I25118 Atherosclerotic heart disease of native coronary artery with other forms of angina pectoris: Secondary | ICD-10-CM

## 2021-02-03 NOTE — Telephone Encounter (Signed)
Requested medication (s) are due for refill today -yes  Requested medication (s) are on the active medication list -yes  Future visit scheduled -no  Last refill: 11/13/20  Notes to clinic: Request RF: Fails lab protocol- 10/17/14  Requested Prescriptions  Pending Prescriptions Disp Refills   atorvastatin (LIPITOR) 10 MG tablet [Pharmacy Med Name: atorvastatin 10 mg tablet] 30 tablet 1    Sig: TAKE ONE TABLET BY MOUTH EVERY MORNING     Cardiovascular:  Antilipid - Statins Failed - 02/03/2021  1:52 PM      Failed - Total Cholesterol in normal range and within 360 days    Cholesterol  Date Value Ref Range Status  10/17/2014 131 0 - 200 mg/dL Final    Comment:    ATP III Classification       Desirable:  < 200 mg/dL               Borderline High:  200 - 239 mg/dL          High:  > = 240 mg/dL          Failed - LDL in normal range and within 360 days    LDL Cholesterol  Date Value Ref Range Status  10/17/2014 80 0 - 99 mg/dL Final          Failed - HDL in normal range and within 360 days    HDL  Date Value Ref Range Status  10/17/2014 40.80 >39.00 mg/dL Final          Failed - Triglycerides in normal range and within 360 days    Triglycerides  Date Value Ref Range Status  10/17/2014 52.0 0.0 - 149.0 mg/dL Final    Comment:    Normal:  <150 mg/dLBorderline High:  150 - 199 mg/dL          Passed - Patient is not pregnant      Passed - Valid encounter within last 12 months    Recent Outpatient Visits           8 months ago Essential hypertension   [place], Neoma Laming B, MD   9 months ago Type 2 diabetes mellitus without complication, without long-term current use of insulin ([place])   [place], Ne[place] Laming B, MD   2 years ago Constipation, unspecified constipation type   [place], [place], [place]   2 years ago Constipation, unspecified constipation type    [place]   2 years ago Coronary 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 [facility])   [place] [name], MD              Signed Prescriptions Disp Refills   nitroGLYCERIN (NITROSTAT) 0.4 MG SL tablet 25 tablet 1    Sig: DISSOLVE 1 TABLET UNDER THE TONGUE EVERY 5 MINUTES AS NEEDED FOR CHEST PAIN. DO NOT EXCEED A TOTAL OF 3 DOSES IN 15 MINUTES.     Cardiovascular:  Nitrates Failed - 02/03/2021  1:52 PM      Failed - Last BP in normal range    BP Readings from Last 1 Encounters:  12/30/20 (!) 143/79          Passed - Last Heart Rate in normal range    Pulse Readings from Last 1 Encounters:  12/30/20 63  Passed - Valid encounter within last 12 months    Recent Outpatient Visits           8 months ago Essential hypertension   [place] [name], MD   9 months ago Type 2 diabetes mellitus without complication, without long-term current use of insulin ([place])   [place], Deborah B, MD   2 years ago Constipation, unspecified constipation type   [place], [place], [place]   2 years ago Constipation, unspecified constipation type   [place]   2 years ago Coronary 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 [facility])   [place] [name], MD                 Requested Prescriptions  Pending Prescriptions Disp Refills   atorvastatin (LIPITOR) 10 MG tablet [Pharmacy Med Name: atorvastatin 10 mg tablet] 30 tablet 1    Sig: TAKE ONE TABLET BY MOUTH EVERY MORNING     Cardiovascular:  Antilipid - Statins Failed - 02/03/2021  1:52 PM      Failed - Total Cholesterol in normal range and within 360 days    Cholesterol  Date Value Ref Range Status   10/17/2014 131 0 - 200 mg/dL Final    Comment:    ATP III Classification       Desirable:  < 200 mg/dL               Borderline High:  200 - 239 mg/dL          High:  > = 240 mg/dL          Failed - LDL in normal range and within 360 days    LDL Cholesterol  Date Value Ref Range Status  10/17/2014 80 0 - 99 mg/dL Final          Failed - HDL in normal range and within 360 days    HDL  Date Value Ref Range Status  10/17/2014 40.80 >39.00 mg/dL Final          Failed - Triglycerides in normal range and within 360 days    Triglycerides  Date Value Ref Range Status  10/17/2014 52.0 0.0 - 149.0 mg/dL Final    Comment:    Normal:  <150 mg/dLBorderline High:  150 - 199 mg/dL          Passed - Patient is not pregnant      Passed - Valid encounter within last 12 months    Recent Outpatient Visits           8 months ago Essential hypertension   [place], Neoma Laming B, MD   9 months ago Type 2 diabetes mellitus without complication, without long-term current use of insulin ([place])   [place], Deborah B, MD   2 years ago Constipation, unspecified constipation type   [place], [place], [place]   2 years ago Constipation, unspecified constipation type   [place]   2 years ago Coronary 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 [facility])   [place] [name], MD              Signed Prescriptions  Disp Refills   nitroGLYCERIN (NITROSTAT) 0.4 MG SL tablet 25 tablet 1    Sig: DISSOLVE 1 TABLET UNDER THE TONGUE EVERY 5 MINUTES AS NEEDED FOR CHEST PAIN. DO NOT EXCEED A TOTAL OF 3 DOSES IN 15 MINUTES.     Cardiovascular:  Nitrates Failed - 02/03/2021  1:52 PM      Failed - Last BP in normal range    BP Readings from Last 1 Encounters:  12/30/20 (!) 143/79           Passed - Last Heart Rate in normal range    Pulse Readings from Last 1 Encounters:  12/30/20 63          Passed - Valid encounter within last 12 months    Recent Outpatient Visits           8 months ago Essential hypertension   [place], Neoma Laming B, MD   9 months ago Type 2 diabetes mellitus without complication, without long-term current use of insulin ([place])   [place], Deborah B, MD   2 years ago Constipation, unspecified constipation type   [place], [place], [place]   2 years ago Constipation, unspecified constipation type   [place]   2 years ago Coronary 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 [facility])   [place] [facility] [name], MD

## 2021-02-09 ENCOUNTER — Other Ambulatory Visit: Payer: Self-pay | Admitting: Urology

## 2021-02-16 ENCOUNTER — Telehealth: Payer: Self-pay | Admitting: Internal Medicine

## 2021-02-16 NOTE — Telephone Encounter (Signed)
Contacted and left a detailed vm making pt aware that he will need to speak with his oncologist in regards to pain medication and if he has any questions or concerns to give [place] a call

## 2021-02-16 NOTE — Telephone Encounter (Signed)
Copied from [place] [phone]. Topic: General - Other >> Feb 12, 2021  9:17 AM Holley Dexter N wrote: Reason for CRM: Pt called in stating he has stage 4 cancer, and he was sent to [facility] long and got some medication, but pt states he is unable to afford it and asked if PCP could help him with this situation and requested a call back, please advise. >> Feb 12, 2021  3:09 PM Leward Quan A wrote: Patient called in again to inquire of Dr Wynetta Emery about him getting some pain medication say that he have stage 4 cancer and can not afford the medication but need Dr Wynetta Emery to send an Rx to his pharmacy. Please call Ph# [phone]

## 2021-02-17 ENCOUNTER — Other Ambulatory Visit: Payer: Self-pay | Admitting: Obstetrics and Gynecology

## 2021-02-17 NOTE — Patient Outreach (Signed)
Care Coordination  02/17/2021  Marcy Bogosian Friddle [birth year] [phone]   Medicaid Managed Care   Unsuccessful Outreach Note  02/17/2021 Name: Troy Mclaughlin MRN: [id number] DOB: 12-Aug-1955  Referred by: [name], MD Reason for referral : High Risk Managed Medicaid (Unsuccessful telephone outreach)   A second unsuccessful telephone outreach was attempted today. The patient was referred to the case management team for assistance with care management and care coordination.   Follow Up Plan: The care management team will reach out to the patient again over the next 7 days.   Aida Raider RN, BSN [place]  [employer] - Managed Medicaid High Risk [phone].

## 2021-02-17 NOTE — Patient Instructions (Signed)
Visit Information  [name]  - as a part of your Medicaid benefit, you are eligible for care management and care coordination services at no cost or copay. I was unable to reach you by phone today but would be happy to help you with your health related needs. Please feel free to call me at [phone].  A member of the Managed Medicaid care management team will reach out to you again over the next 7 days.   Aida Raider RN, BSN   [employer] - Managed Medicaid High Risk [phone].

## 2021-02-19 ENCOUNTER — Telehealth: Payer: Self-pay | Admitting: Internal Medicine

## 2021-02-19 ENCOUNTER — Encounter (HOSPITAL_COMMUNITY): Admission: RE | Admit: 2021-02-19 | Payer: Medicaid Other | Source: Ambulatory Visit

## 2021-02-19 ENCOUNTER — Encounter (HOSPITAL_COMMUNITY): Payer: Self-pay

## 2021-02-19 NOTE — Telephone Encounter (Signed)
..   Medicaid Managed Care   Unsuccessful Outreach Note  02/19/2021 Name: Troy Mclaughlin MRN: [id number] DOB: 02/24/1956  Referred by: [name], MD Reason for referral : High Risk Managed Medicaid (I called the patient today to get his phone visit with the MM RNCM rescheduled. I was not able to reach him or leave a message.)   An unsuccessful telephone outreach was attempted today. The patient was referred to the case management team for assistance with care management and care coordination.   Follow Up Plan: The care management team will reach out to the patient again over the next 7 days.   [place]

## 2021-02-24 ENCOUNTER — Telehealth: Payer: Self-pay | Admitting: Internal Medicine

## 2021-02-24 NOTE — Telephone Encounter (Signed)
..   Medicaid Managed Care   Unsuccessful Outreach Note  02/24/2021 Name: Troy Mclaughlin MRN: [id number] DOB: Mar 05, 1956  Referred by: [name], MD Reason for referral : High Risk Managed Medicaid (I called the patient today to get his phone visit with the MM RNCM rescheduled. Patient did not answer and I was not able to leave a message.)   A second unsuccessful telephone outreach was attempted today. The patient was referred to the case management team for assistance with care management and care coordination.   Follow Up Plan: The care management team will reach out to the patient again over the next 7 days.   [place]

## 2021-02-25 ENCOUNTER — Telehealth: Payer: Self-pay | Admitting: Internal Medicine

## 2021-02-25 ENCOUNTER — Emergency Department (HOSPITAL_COMMUNITY)
Admission: EM | Admit: 2021-02-25 | Discharge: 2021-02-25 | Disposition: A | Discharge status: Home / Self Care | Payer: Medicaid Other | Attending: Emergency Medicine | Admitting: Emergency Medicine

## 2021-02-25 ENCOUNTER — Other Ambulatory Visit: Payer: Self-pay

## 2021-02-25 ENCOUNTER — Telehealth: Payer: Self-pay | Admitting: Radiation Oncology

## 2021-02-25 ENCOUNTER — Encounter (HOSPITAL_COMMUNITY): Payer: Self-pay

## 2021-02-25 DIAGNOSIS — R112 Nausea with vomiting, unspecified: Secondary | ICD-10-CM | POA: Insufficient documentation

## 2021-02-25 DIAGNOSIS — R42 Dizziness and giddiness: Secondary | ICD-10-CM | POA: Insufficient documentation

## 2021-02-25 DIAGNOSIS — R1111 Vomiting without nausea: Secondary | ICD-10-CM | POA: Diagnosis not present

## 2021-02-25 DIAGNOSIS — K921 Melena: Secondary | ICD-10-CM | POA: Insufficient documentation

## 2021-02-25 DIAGNOSIS — Z5321 Procedure and treatment not carried out due to patient leaving prior to being seen by health care provider: Secondary | ICD-10-CM | POA: Insufficient documentation

## 2021-02-25 NOTE — ED Provider Notes (Signed)
Emergency Medicine Provider Triage Evaluation Note  Troy Mclaughlin , a 65 y.o. male  was evaluated in triage.  Pt complains of nausea, vomiting, diarrhea for the past 2 days.  Patient states that he has been having intermittent hematemesis as well as hematochezia.  Reports dizziness/lightheadedness.  Also notes drinking beer for the past few days.  Physical Exam  There were no vitals taken for this visit. Gen:   Awake, no distress   Resp:  Normal effort  MSK:   Moves extremities without difficulty  Other:    Medical Decision Making  Medically screening exam initiated at 5:37 PM.  Appropriate orders placed.  [name] was informed that the remainder of the evaluation will be completed by another provider, this initial triage assessment does not replace that evaluation, and the importance of remaining in the ED until their evaluation is complete.   Rayna Sexton, PA-C 02/25/21 1738    Charlesetta Shanks, MD 03/08/21 [phone]

## 2021-02-25 NOTE — ED Notes (Signed)
Pt stated he was leaving and walked out the front door

## 2021-02-25 NOTE — ED Triage Notes (Signed)
Pt BIB EMS from home. Pt reports N/V/D x2 days with dark red blood. Pt reports dizziness and lightheadedness. Pt endorses only drinking beer for the past few days.   18G LAC 521mL 151/90 HR 60 100% RA CBG 112

## 2021-02-25 NOTE — Telephone Encounter (Signed)
Attempting to reach pt since 10/3 to sched FUN with Ashlyn Bruning PA. Unable to LVM to main number and daughter's contact # seems to be disconnected.

## 2021-02-25 NOTE — Telephone Encounter (Signed)
Pt called and stated that he has been trying for a couple of weeks to speak with Dr. Wynetta Emery / he mention that he has cancer and that the metformin he is prescribed is too strong for him to take/ he would like a call form Dr. Wynetta Emery asap / please advise

## 2021-02-26 ENCOUNTER — Telehealth: Payer: Self-pay | Admitting: Radiation Oncology

## 2021-02-26 ENCOUNTER — Telehealth: Payer: Self-pay

## 2021-02-26 NOTE — Telephone Encounter (Signed)
Unable to LVM to schedule FUN

## 2021-02-26 NOTE — Telephone Encounter (Signed)
Returned pt call lvm

## 2021-02-26 NOTE — Telephone Encounter (Signed)
Transition Care Management Unsuccessful Follow-up Telephone Call  Date of discharge and from where:  02/25/2021-[facility]   Attempts:  1st Attempt  Reason for unsuccessful TCM follow-up call:  Unable to reach patient

## 2021-02-28 ENCOUNTER — Other Ambulatory Visit: Payer: Self-pay | Admitting: Internal Medicine

## 2021-02-28 NOTE — Telephone Encounter (Signed)
Requested medication (s) are due for refill today: -  Requested medication (s) are on the active medication list: historical med  Last refill:  04/10/20 #30  Future visit scheduled: 03/01/21  Notes to clinic:  historical provider   Requested Prescriptions  Pending Prescriptions Disp Refills   tamsulosin (FLOMAX) 0.4 MG CAPS capsule [Pharmacy Med Name: tamsulosin 0.4 mg capsule] 30 capsule 3    Sig: [place]     Urology: Alpha-Adrenergic Blocker Passed - 02/28/2021  8:01 AM      Passed - Last BP in normal range    BP Readings from Last 1 Encounters:  02/25/21 139/89          Passed - Valid encounter within last 12 months    Recent Outpatient Visits           8 months ago Essential hypertension   [place], Neoma Laming B, MD   10 months ago Type 2 diabetes mellitus without complication, without long-term current use of insulin ([place])   [place], Deborah B, MD   2 years ago Constipation, unspecified constipation type   [place], [place], [place]   2 years ago Constipation, unspecified constipation type   [place]   2 years ago Coronary 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 [facility])   [place], MD       Future Appointments             Tomorrow [name], MD [place]

## 2021-03-01 ENCOUNTER — Other Ambulatory Visit: Payer: Self-pay

## 2021-03-01 ENCOUNTER — Ambulatory Visit: Payer: Medicaid Other | Attending: Internal Medicine | Admitting: Internal Medicine

## 2021-03-01 ENCOUNTER — Encounter: Payer: Self-pay | Admitting: Internal Medicine

## 2021-03-01 VITALS — BP 137/90 | HR 73 | Resp 16 | Wt 116.6 lb

## 2021-03-01 DIAGNOSIS — E119 Type 2 diabetes mellitus without complications: Secondary | ICD-10-CM | POA: Insufficient documentation

## 2021-03-01 DIAGNOSIS — Z114 Encounter for screening for human immunodeficiency virus [HIV]: Secondary | ICD-10-CM

## 2021-03-01 DIAGNOSIS — I252 Old myocardial infarction: Secondary | ICD-10-CM | POA: Insufficient documentation

## 2021-03-01 DIAGNOSIS — F17210 Nicotine dependence, cigarettes, uncomplicated: Secondary | ICD-10-CM | POA: Insufficient documentation

## 2021-03-01 DIAGNOSIS — I2510 Atherosclerotic heart disease of native coronary artery without angina pectoris: Secondary | ICD-10-CM | POA: Diagnosis not present

## 2021-03-01 DIAGNOSIS — D539 Nutritional anemia, unspecified: Secondary | ICD-10-CM | POA: Diagnosis not present

## 2021-03-01 DIAGNOSIS — Z9049 Acquired absence of other specified parts of digestive tract: Secondary | ICD-10-CM | POA: Insufficient documentation

## 2021-03-01 DIAGNOSIS — R1010 Upper abdominal pain, unspecified: Secondary | ICD-10-CM

## 2021-03-01 DIAGNOSIS — R1012 Left upper quadrant pain: Secondary | ICD-10-CM | POA: Insufficient documentation

## 2021-03-01 DIAGNOSIS — C61 Malignant neoplasm of prostate: Secondary | ICD-10-CM | POA: Insufficient documentation

## 2021-03-01 DIAGNOSIS — I10 Essential (primary) hypertension: Secondary | ICD-10-CM | POA: Diagnosis not present

## 2021-03-01 DIAGNOSIS — Z681 Body mass index (BMI) 19 or less, adult: Secondary | ICD-10-CM | POA: Diagnosis not present

## 2021-03-01 DIAGNOSIS — D509 Iron deficiency anemia, unspecified: Secondary | ICD-10-CM | POA: Diagnosis not present

## 2021-03-01 DIAGNOSIS — Z8249 Family history of ischemic heart disease and other diseases of the circulatory system: Secondary | ICD-10-CM | POA: Insufficient documentation

## 2021-03-01 DIAGNOSIS — Z79899 Other long term (current) drug therapy: Secondary | ICD-10-CM | POA: Diagnosis not present

## 2021-03-01 DIAGNOSIS — R300 Dysuria: Secondary | ICD-10-CM | POA: Insufficient documentation

## 2021-03-01 DIAGNOSIS — K921 Melena: Secondary | ICD-10-CM | POA: Insufficient documentation

## 2021-03-01 DIAGNOSIS — R634 Abnormal weight loss: Secondary | ICD-10-CM | POA: Diagnosis not present

## 2021-03-01 DIAGNOSIS — K2100 Gastro-esophageal reflux disease with esophagitis, without bleeding: Secondary | ICD-10-CM | POA: Insufficient documentation

## 2021-03-01 DIAGNOSIS — R112 Nausea with vomiting, unspecified: Secondary | ICD-10-CM | POA: Insufficient documentation

## 2021-03-01 DIAGNOSIS — Z794 Long term (current) use of insulin: Secondary | ICD-10-CM | POA: Insufficient documentation

## 2021-03-01 DIAGNOSIS — Z809 Family history of malignant neoplasm, unspecified: Secondary | ICD-10-CM | POA: Insufficient documentation

## 2021-03-01 DIAGNOSIS — Z87442 Personal history of urinary calculi: Secondary | ICD-10-CM | POA: Diagnosis not present

## 2021-03-01 DIAGNOSIS — Z886 Allergy status to analgesic agent status: Secondary | ICD-10-CM | POA: Diagnosis not present

## 2021-03-01 DIAGNOSIS — E46 Unspecified protein-calorie malnutrition: Secondary | ICD-10-CM | POA: Insufficient documentation

## 2021-03-01 DIAGNOSIS — Z888 Allergy status to other drugs, medicaments and biological substances status: Secondary | ICD-10-CM | POA: Insufficient documentation

## 2021-03-01 DIAGNOSIS — F1010 Alcohol abuse, uncomplicated: Secondary | ICD-10-CM | POA: Insufficient documentation

## 2021-03-01 DIAGNOSIS — K625 Hemorrhage of anus and rectum: Secondary | ICD-10-CM | POA: Diagnosis not present

## 2021-03-01 DIAGNOSIS — Z8673 Personal history of transient ischemic attack (TIA), and cerebral infarction without residual deficits: Secondary | ICD-10-CM | POA: Insufficient documentation

## 2021-03-01 DIAGNOSIS — Z5982 Transportation insecurity: Secondary | ICD-10-CM | POA: Insufficient documentation

## 2021-03-01 DIAGNOSIS — Z5986 Financial insecurity: Secondary | ICD-10-CM | POA: Insufficient documentation

## 2021-03-01 MED ORDER — ONDANSETRON HCL 4 MG PO TABS
4.0000 mg | ORAL_TABLET | Freq: Three times a day (TID) | ORAL | 3 refills | Status: DC | PRN
  Filled 2021-03-01 – 2021-03-08 (×2): qty 15, 5d supply, fill #0

## 2021-03-01 MED ORDER — LOPERAMIDE HCL 2 MG PO TABS
2.0000 mg | ORAL_TABLET | Freq: Three times a day (TID) | ORAL | 0 refills | Status: DC | PRN
  Filled 2021-03-01: qty 15, 5d supply, fill #0

## 2021-03-01 MED ORDER — CIPROFLOXACIN HCL 500 MG PO TABS
500.0000 mg | ORAL_TABLET | Freq: Two times a day (BID) | ORAL | 0 refills | Status: DC
  Filled 2021-03-01 – 2021-03-08 (×2): qty 14, 7d supply, fill #0

## 2021-03-01 NOTE — Progress Notes (Signed)
Pt is in the office today for not being able to eat in 4-5 days

## 2021-03-01 NOTE — Telephone Encounter (Signed)
Transition Care Management Unsuccessful Follow-up Telephone Call  Date of discharge and from where:  02/25/2021 from [facility]  Attempts:  2nd Attempt  Reason for unsuccessful TCM follow-up call:  Unable to reach patient
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
## 2021-03-02 ENCOUNTER — Other Ambulatory Visit: Payer: Self-pay

## 2021-03-02 ENCOUNTER — Ambulatory Visit: Payer: Self-pay | Admitting: *Deleted

## 2021-03-02 ENCOUNTER — Telehealth: Payer: Self-pay | Admitting: Internal Medicine

## 2021-03-02 LAB — COMPREHENSIVE METABOLIC PANEL
ALT: 20 IU/L (ref 0–44)
AST: 38 IU/L (ref 0–40)
Albumin/Globulin Ratio: 1.5 (ref 1.2–2.2)
Albumin: 4.4 g/dL (ref 3.8–4.8)
Alkaline Phosphatase: 128 IU/L — ABNORMAL HIGH (ref 44–121)
BUN/Creatinine Ratio: 8 — ABNORMAL LOW (ref 10–24)
BUN: 6 mg/dL — ABNORMAL LOW (ref 8–27)
Bilirubin Total: 0.6 mg/dL (ref 0.0–1.2)
CO2: 25 mmol/L (ref 20–29)
Calcium: 9.7 mg/dL (ref 8.6–10.2)
Chloride: 101 mmol/L (ref 96–106)
Creatinine, Ser: 0.77 mg/dL (ref 0.76–1.27)
Globulin, Total: 2.9 g/dL (ref 1.5–4.5)
Glucose: 95 mg/dL (ref 70–99)
Potassium: 3.7 mmol/L (ref 3.5–5.2)
Sodium: 142 mmol/L (ref 134–144)
Total Protein: 7.3 g/dL (ref 6.0–8.5)
eGFR: 101 mL/min/{1.73_m2} (ref >59)

## 2021-03-02 LAB — CBC
Hematocrit: 42.2 % (ref 37.5–51.0)
Hemoglobin: 13.9 g/dL (ref 13.0–17.7)
MCH: 25.3 pg — ABNORMAL LOW (ref 26.6–33.0)
MCHC: 32.9 g/dL (ref 31.5–35.7)
MCV: 77 fL — ABNORMAL LOW (ref 79–97)
Platelets: 213 10*3/uL (ref 150–450)
RBC: 5.49 x10E6/uL (ref 4.14–5.80)
RDW: 16.5 % — ABNORMAL HIGH (ref 11.6–15.4)
WBC: 4.8 10*3/uL (ref 3.4–10.8)

## 2021-03-02 LAB — URINALYSIS, ROUTINE W REFLEX MICROSCOPIC
Bilirubin, UA: NEGATIVE
Nitrite, UA: NEGATIVE
RBC, UA: NEGATIVE
Specific Gravity, UA: 1.022 (ref 1.005–1.030)
Urobilinogen, Ur: 1 mg/dL (ref 0.2–1.0)
pH, UA: 7 (ref 5.0–7.5)

## 2021-03-02 LAB — LIPASE: Lipase: 19 U/L (ref 13–78)

## 2021-03-02 LAB — MICROSCOPIC EXAMINATION
Bacteria, UA: NONE SEEN
Casts: NONE SEEN /lpf
Epithelial Cells (non renal): NONE SEEN /hpf (ref 0–10)
WBC, UA: NONE SEEN /hpf (ref 0–5)

## 2021-03-02 LAB — IRON,TIBC AND FERRITIN PANEL
Ferritin: 370 ng/mL (ref 30–400)
Iron Saturation: 53 % (ref 15–55)
Iron: 127 ug/dL (ref 38–169)
Total Iron Binding Capacity: 238 ug/dL — ABNORMAL LOW (ref 250–450)
UIBC: 111 ug/dL (ref 111–343)

## 2021-03-02 LAB — TSH+T4F+T3FREE
Free T4: 0.99 ng/dL (ref 0.82–1.77)
T3, Free: 3 pg/mL (ref 2.0–4.4)
TSH: 0.441 u[IU]/mL — ABNORMAL LOW (ref 0.450–4.500)

## 2021-03-02 LAB — HIV ANTIBODY (ROUTINE TESTING W REFLEX): HIV Screen 4th Generation wRfx: NONREACTIVE

## 2021-03-02 NOTE — Telephone Encounter (Signed)
..   Medicaid Managed Care   Unsuccessful Outreach Note  03/02/2021 Name: Troy Mclaughlin MRN: [id number] DOB: Jun 09, 1955  Referred by: [name], MD Reason for referral : High Risk Managed Medicaid (I called the patient today to get him rescheduled with the [facility] RNCM for a phone visit. I was not able to leave a message. This was my third attempt to reach the patient.)   Third unsuccessful telephone outreach was attempted today. The patient was referred to the case management team for assistance with care management and care coordination. The patient's primary care provider has been notified of our unsuccessful attempts to make or maintain contact with the patient. The care management team is pleased to engage with this patient at any time in the future should he/she be interested in assistance from the care management team.   Follow Up Plan: We have been unable to make contact with the patient for follow up. The care management team is available to follow up with the patient after provider conversation with the patient regarding recommendation for care management engagement and subsequent re-referral to the care management team.   [place], [place]
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
## 2021-03-02 NOTE — Telephone Encounter (Signed)
Transition Care Management Unsuccessful Follow-up Telephone Call  Date of discharge and from where:  02/25/2021 from [facility]  Attempts:  3rd Attempt  Reason for unsuccessful TCM follow-up call:  Unable to reach patient

## 2021-03-03 ENCOUNTER — Telehealth: Payer: Self-pay

## 2021-03-03 NOTE — Telephone Encounter (Signed)
Contacted pt to go over lab results pt didn't answer lvm   Sent a CRM and forward labs to NT to give pt labs when they call back   

## 2021-03-03 NOTE — Telephone Encounter (Signed)
Pt was seen on 10/10

## 2021-03-08 ENCOUNTER — Other Ambulatory Visit: Payer: Self-pay

## 2021-03-11 ENCOUNTER — Other Ambulatory Visit: Payer: Self-pay | Admitting: Internal Medicine

## 2021-03-11 DIAGNOSIS — I10 Essential (primary) hypertension: Secondary | ICD-10-CM

## 2021-03-14 DIAGNOSIS — M542 Cervicalgia: Secondary | ICD-10-CM | POA: Diagnosis not present

## 2021-03-15 ENCOUNTER — Ambulatory Visit: Admit: 2021-03-15 | Payer: Medicaid Other | Admitting: Urology

## 2021-03-15 ENCOUNTER — Other Ambulatory Visit (HOSPITAL_COMMUNITY): Payer: Self-pay

## 2021-03-15 ENCOUNTER — Encounter (HOSPITAL_COMMUNITY): Payer: Self-pay | Admitting: Emergency Medicine

## 2021-03-15 ENCOUNTER — Emergency Department (HOSPITAL_COMMUNITY)
Admission: EM | Admit: 2021-03-15 | Discharge: 2021-03-15 | Disposition: A | Discharge status: Home / Self Care | Payer: Medicaid Other | Attending: Emergency Medicine | Admitting: Emergency Medicine

## 2021-03-15 ENCOUNTER — Emergency Department (HOSPITAL_COMMUNITY): Payer: Medicaid Other

## 2021-03-15 ENCOUNTER — Other Ambulatory Visit: Payer: Self-pay

## 2021-03-15 DIAGNOSIS — S161XXA Strain of muscle, fascia and tendon at neck level, initial encounter: Secondary | ICD-10-CM

## 2021-03-15 DIAGNOSIS — S199XXA Unspecified injury of neck, initial encounter: Secondary | ICD-10-CM | POA: Diagnosis present

## 2021-03-15 DIAGNOSIS — Z79899 Other long term (current) drug therapy: Secondary | ICD-10-CM | POA: Diagnosis not present

## 2021-03-15 DIAGNOSIS — I10 Essential (primary) hypertension: Secondary | ICD-10-CM | POA: Insufficient documentation

## 2021-03-15 DIAGNOSIS — Z8546 Personal history of malignant neoplasm of prostate: Secondary | ICD-10-CM | POA: Diagnosis not present

## 2021-03-15 DIAGNOSIS — I2510 Atherosclerotic heart disease of native coronary artery without angina pectoris: Secondary | ICD-10-CM | POA: Insufficient documentation

## 2021-03-15 DIAGNOSIS — Z955 Presence of coronary angioplasty implant and graft: Secondary | ICD-10-CM | POA: Diagnosis not present

## 2021-03-15 DIAGNOSIS — E119 Type 2 diabetes mellitus without complications: Secondary | ICD-10-CM | POA: Diagnosis not present

## 2021-03-15 DIAGNOSIS — R519 Headache, unspecified: Secondary | ICD-10-CM | POA: Diagnosis not present

## 2021-03-15 DIAGNOSIS — R569 Unspecified convulsions: Secondary | ICD-10-CM | POA: Diagnosis not present

## 2021-03-15 DIAGNOSIS — F17210 Nicotine dependence, cigarettes, uncomplicated: Secondary | ICD-10-CM | POA: Diagnosis not present

## 2021-03-15 DIAGNOSIS — W01198A Fall on same level from slipping, tripping and stumbling with subsequent striking against other object, initial encounter: Secondary | ICD-10-CM | POA: Diagnosis not present

## 2021-03-15 DIAGNOSIS — M542 Cervicalgia: Secondary | ICD-10-CM | POA: Diagnosis not present

## 2021-03-15 DIAGNOSIS — S0990XA Unspecified injury of head, initial encounter: Secondary | ICD-10-CM | POA: Diagnosis not present

## 2021-03-15 DIAGNOSIS — G319 Degenerative disease of nervous system, unspecified: Secondary | ICD-10-CM | POA: Diagnosis not present

## 2021-03-15 SURGERY — PROSTATECTOMY, RADICAL, ROBOT-ASSISTED, LAPAROSCOPIC
Anesthesia: General

## 2021-03-15 MED ORDER — METHOCARBAMOL 500 MG PO TABS
1000.0000 mg | ORAL_TABLET | Freq: Once | ORAL | Status: AC
  Administered 2021-03-15: 1000 mg via ORAL
  Filled 2021-03-15: qty 2

## 2021-03-15 MED ORDER — HYDROCODONE-ACETAMINOPHEN 5-325 MG PO TABS
1.0000 | ORAL_TABLET | Freq: Four times a day (QID) | ORAL | 0 refills | Status: AC | PRN
  Filled 2021-03-15: qty 10, 3d supply, fill #0

## 2021-03-15 MED ORDER — TIZANIDINE HCL 4 MG PO TABS
4.0000 mg | ORAL_TABLET | Freq: Three times a day (TID) | ORAL | 0 refills | Status: AC | PRN
  Filled 2021-03-15: qty 21, 7d supply, fill #0

## 2021-03-15 MED ORDER — OXYCODONE-ACETAMINOPHEN 5-325 MG PO TABS
2.0000 | ORAL_TABLET | Freq: Once | ORAL | Status: AC
Start: 2021-03-15 — End: 2021-03-15
  Administered 2021-03-15: 2 via ORAL
  Filled 2021-03-15: qty 2

## 2021-03-15 MED ORDER — TIZANIDINE HCL 4 MG PO TABS
4.0000 mg | ORAL_TABLET | Freq: Three times a day (TID) | ORAL | 0 refills | Status: DC | PRN
  Filled 2021-03-15: qty 21, 7d supply, fill #0

## 2021-03-15 MED ORDER — HYDROCODONE-ACETAMINOPHEN 5-325 MG PO TABS
1.0000 | ORAL_TABLET | Freq: Four times a day (QID) | ORAL | 0 refills | Status: DC | PRN
  Filled 2021-03-15: qty 10, 3d supply, fill #0

## 2021-03-15 NOTE — ED Notes (Signed)
ED PA at [facility] 

## 2021-03-15 NOTE — ED Triage Notes (Signed)
Per EMS, pt from home, c/o neck pain.   When sitting still he has not pain but any movements brings him to a 10/10 pain.    Pt has been diagnosed w/ stomach cancer.   140/86 82p 18RR 98% RA

## 2021-03-15 NOTE — ED Notes (Signed)
D/c to w/r to wait for meds from [place]

## 2021-03-15 NOTE — ED Notes (Signed)
SW at [facility]

## 2021-03-15 NOTE — ED Notes (Signed)
Pt alert, NAD, calm, interactive, resps e/u, speaking in clear complete sentences, VSS. Imaging results reviewed. EDPA at [facility]. Meds given. Requesting sandwich and bus pass.

## 2021-03-15 NOTE — Discharge Instructions (Addendum)
Your head and neck CTs look good today.  I suspect you strained the muscles in your neck.  Information about this type of injury is attached to your discharge papers.  I am sending you home with pain medication and muscle relaxants.  Please take these as prescribed.   It is also important that you call the urology office back and get your prostate surgery to help prevent the spreading of your cancer.   It was a pleasure to meet you and I hope that you feel better
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
## 2021-03-15 NOTE — Discharge Planning (Signed)
RNCM met with pt at bedside regarding upcoming appointments and transportation needs.  Pt verbalizes importance of keeping appointment and is aware of transportation number for future appointments.  RNCM updated phone numbers for pt and family members in chart.

## 2021-03-15 NOTE — ED Provider Notes (Signed)
Emergency Medicine Provider Triage Evaluation Note  Troy Mclaughlin , a 65 y.o. male  was evaluated in triage.  Pt complains of severe neck pain for the last 3-4 days.  Patient reports he fell approximately 1 week ago and struck his head.  Believes he had a loss of consciousness and possibly a seizure at that time but did not seek treatment.  Reports he was fine for several days and then has since developed this right-sided neck pain.  No additional trauma noted.  Patient reports movement and palpation make the symptoms worse.  He is fine at rest but movement is a 10/10.  Denies radiation into his arms, numbness, tingling or weakness.  No additional syncope or seizure.  Patient is actively being treated for stomach cancer.  He is not currently taking chemotherapy.  Reports he is able to ambulate without difficulty.  Review of Systems  Positive: Neck pain Negative: Numbness, weakness  Physical Exam  BP (!) 154/95 (BP Location: Left Arm)   Pulse 76   Temp 99.2 F (37.3 C) (Oral)   Resp 18   SpO2 98%  Gen:   Awake, no distress   Resp:  Normal effort  MSK:   Moves extremities without difficulty  Other:  Minimal range of motion of the C-spine.  Tenderness to palpation of the right paraspinal muscles.  No palpable deformity, step-off to the cervical spine.  Medical Decision Making  Medically screening exam initiated at 12:38 AM.  Appropriate orders placed.  Yazeed Pryer Guilliams was informed that the remainder of the evaluation will be completed by another provider, this initial triage assessment does not replace that evaluation, and the importance of remaining in the ED until their evaluation is complete.  Neck pain.  Current cancer patient.  Imaging pending.   Antwan Pandya, Gwenlyn Perking 03/15/21 0040    Veryl Speak, MD 03/15/21 [phone]

## 2021-03-16 ENCOUNTER — Telehealth: Payer: Self-pay

## 2021-03-16 ENCOUNTER — Other Ambulatory Visit (HOSPITAL_COMMUNITY): Payer: Self-pay

## 2021-03-16 NOTE — Telephone Encounter (Signed)
Transition Care Management Follow-up Telephone Call Date of discharge and from where: 03/15/2021-[facility]  How have you been since you were released from the hospital? Patient stated he still has neck pain and he will pick up his medication today.  Any questions or concerns? No  Items Reviewed: Did the pt receive and understand the discharge instructions provided? Yes  Medications obtained and verified? Yes  Other? No  Any new allergies since your discharge? No  Dietary orders reviewed? No Do you have support at home? Yes   Home Care and Equipment/Supplies: Were home health services ordered? not applicable If so, what is the name of the agency? N/A  Has the agency set up a time to come to the patient's home? not applicable Were any new equipment or medical supplies ordered?  No What is the name of the medical supply agency? N/A Were you able to get the supplies/equipment? not applicable Do you have any questions related to the use of the equipment or supplies? No  Functional Questionnaire: (I = Independent and D = Dependent) ADLs: I  Bathing/Dressing- I  Meal Prep- I  Eating- I  Maintaining continence- I  Transferring/Ambulation- I  Managing Meds- I  Follow up appointments reviewed:  PCP Hospital f/u appt confirmed? Yes  Scheduled to see Dr. Wynetta Emery on 03/30/2021 @ 2:30pm. [facility] f/u appt confirmed? No   Are transportation arrangements needed? No  If their condition worsens, is the pt aware to call PCP or go to the Emergency Dept.? Yes Was the patient provided with contact information for the PCP's office or ED? Yes Was to pt encouraged to call back with questions or concerns? Yes

## 2021-03-23 ENCOUNTER — Other Ambulatory Visit: Payer: Self-pay | Admitting: Obstetrics and Gynecology

## 2021-03-23 ENCOUNTER — Other Ambulatory Visit: Payer: Self-pay

## 2021-03-23 NOTE — Patient Outreach (Signed)
Care Coordination  03/23/2021  [name] Dec 30, 1955 [id number]   Medicaid Managed Care   Unsuccessful Outreach Note  03/23/2021 Name: Troy Mclaughlin MRN: [id number] DOB: May 29, 1955  Referred by: [name], MD Reason for referral : High Risk Managed Medicaid (Unsuccessful telephone outreach)   Third unsuccessful telephone outreach was attempted. The patient was referred to the case management team for assistance with care management and care coordination. The patient's primary care provider has been notified of our unsuccessful attempts to make or maintain contact with the patient. The care management team is pleased to engage with this patient at any time in the future should he/she be interested in assistance from the care management team.   Follow Up Plan: We have been unable to make contact with the patient for follow up. The care management team is available to follow up with the patient after provider conversation with the patient regarding recommendation for care management engagement and subsequent re-referral to the care management team.   Aida Raider RN, BSN [place] Management Coordinator - Managed [facility] High Risk [phone].

## 2021-03-23 NOTE — Patient Instructions (Signed)
Visit Information  Mr. VASILIOS OTTAWAY  - as a part of your Medicaid benefit, you are eligible for care management and care coordination services at no cost or copay. I have been  unable to reach you by phone, but would be happy to help you with your health related needs. Please feel free to call me at [phone].  Aida Raider RN, BSN [place]  [facility] Curator - Managed Medicaid High Risk [phone].

## 2021-03-25 ENCOUNTER — Ambulatory Visit: Payer: Self-pay

## 2021-03-30 ENCOUNTER — Inpatient Hospital Stay: Payer: Medicaid Other | Admitting: Internal Medicine

## 2021-04-06 ENCOUNTER — Encounter: Payer: Self-pay | Admitting: Urology

## 2021-04-06 DIAGNOSIS — N5201 Erectile dysfunction due to arterial insufficiency: Secondary | ICD-10-CM | POA: Diagnosis not present

## 2021-04-06 DIAGNOSIS — C61 Malignant neoplasm of prostate: Secondary | ICD-10-CM | POA: Diagnosis not present

## 2021-04-06 DIAGNOSIS — R350 Frequency of micturition: Secondary | ICD-10-CM | POA: Diagnosis not present

## 2021-04-06 NOTE — Progress Notes (Signed)
Request sent to scheduling team and copied Kathlee Nations regarding the call I got from Dr. Abner Greenspan this morning indicating that the patient was in his office today for further discussion of treatment options and has now decided on LT-ADT with 8 weeks prostate IMRT. Dr. Abner Greenspan was planning to give him the ADT today and will begin coordinating for FM and SO in 05/2021, in anticipation of beginning the EBRT approximately 2 months from start of ADT. I have requested our schedulers set up a telephone FUN visit to revisit the radiation treatment process and logistics since it has been 4 months since we saw him initially. He has been back and forth regarding his treatment decision but per Dr. Abner Greenspan, is now confirmed to proceed with the above plan.  Nicholos Johns, MMS, PA-C [facility] [place]: [phone]  Fax: [phone]

## 2021-04-09 ENCOUNTER — Ambulatory Visit
Admission: RE | Admit: 2021-04-09 | Discharge: 2021-04-09 | Disposition: A | Discharge status: Home / Self Care | Payer: Medicaid Other | Source: Ambulatory Visit | Attending: Radiation Oncology | Admitting: Radiation Oncology

## 2021-04-09 VITALS — Ht 69.0 in | Wt 120.0 lb

## 2021-04-09 DIAGNOSIS — C61 Malignant neoplasm of prostate: Secondary | ICD-10-CM
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
## 2021-04-09 NOTE — Progress Notes (Addendum)
GU Location of Tumor / Histology: Prostate Ca  If Prostate Cancer, Gleason Score is (5 + 4) and PSA is (113 as of 4/22)  Biopsies   Past/Anticipated interventions by urology, if any:   Past/Anticipated interventions by medical oncology, if any:   Weight changes, if any: Lost weight not sure how much.  IPSS:  15 SHIM: 21  Bowel/Bladder complaints, if any:  No bowel and slight blood in urine.  Nausea/Vomiting, if any: Yes, taking  Pain issues, if any:  8/10  lower back pain  SAFETY ISSUES: Prior radiation?  No Pacemaker/ICD? No  Possible current pregnancy?  Male Is the patient on methotrexate? No  Current Complaints / other details:  Need more information on treatment options.

## 2021-04-09 NOTE — Progress Notes (Signed)
GU Location of Tumor / Histology: Prostate Ca  If Prostate Cancer, Gleason Score is (4 + 3) and PSA is (113 as of 6/22)  Biopsies  Dr. Abner Greenspan       Past/Anticipated interventions by urology, if any:   Past/Anticipated interventions by medical oncology, if any:   Weight changes, if any:   IPSS: 15 SHIM:  21  Bowel/Bladder complaints, if any:  No  Nausea/Vomiting, if any: yes, taking medication as needed  Pain issues, if any:  8/10 lower back  SAFETY ISSUES: Prior radiation? No Pacemaker/ICD? No Possible current pregnancy?  Male Is the patient on methotrexate? No  Current Complaints / other details:

## 2021-04-13 ENCOUNTER — Encounter: Payer: Self-pay | Admitting: *Deleted

## 2021-04-13 NOTE — Progress Notes (Signed)
[place] CSW Psychosocial Distress Screening  Social Work was referred by distress screening protocol.  The patient scored a 10 on the Psychosocial Distress Thermometer which indicates severe distress. Social Work Theatre manager contacted patient by phone to assess for distress and other psychosocial needs.  Patient did not answer and VM was full, so Intern was unable to leave a message. Intern will try calling again next week after the Thanksgiving holiday.   ONCBCN DISTRESS SCREENING 04/09/2021  Screening Type Initial Screening  Distress experienced in past week (1-10) 10  Practical problem type   Emotional problem type Adjusting to illness;Nervousness/Anxiety;Depression  Spiritual/Religous concerns type Facing my mortality  Information Concerns Type Lack of info about treatment;Lack of info about diagnosis  Physical Problem type Pain;Nausea/vomiting;Loss of appetitie;Changes in urination;Tingling hands/feet;Sexual problems;Other (comment)    Rosary Lively, Social Work Intern Supervised by Gwinda Maine, LCSW

## 2021-04-13 NOTE — Progress Notes (Signed)
[place] CSW Psychosocial Distress Screening   Social Work was referred by distress screening protocol.  The patient scored a 10 on the Psychosocial Distress Thermometer which indicates severe distress. Social Work Theatre manager contacted patient by phone (2nd attempt) to assess for distress and other psychosocial needs.   Patient reports he has concerns about transportation, food security, rent, and medical bills. Patient lives with his wife who is also ill, and a brother who has a seizure disorder. Pt receives SS and is on Medicaid. He reports limited family and social supports.   SW has made referrals to transportation services, Arboriculturist, and the prostate navigator, and shared information about [employer]. Intern also strongly encouraged pt to call Aida Raider, the coordinator for Managed Medicaid who has made three attempts to reach pt by phone. Intern shared contact # for [facility] with pt. Pt does not yet have any upcoming appointments scheduled, SW Intern will follow up with pt either by phone or in person once treatment plan is in place.     ONCBCN DISTRESS SCREENING 04/09/2021  Screening Type Initial Screening  Distress experienced in past week (1-10) 10  Practical problem type    Emotional problem type Adjusting to illness;Nervousness/Anxiety;Depression  Spiritual/Religous concerns type Facing my mortality  Information Concerns Type Lack of info about treatment;Lack of info about diagnosis  Physical Problem type Pain;Nausea/vomiting;Loss of appetitie;Changes in urination;Tingling hands/feet;Sexual problems;Other (comment)      Rosary Lively, Social Work Intern Supervised by Gwinda Maine, LCSW

## 2021-04-14 ENCOUNTER — Encounter: Payer: Self-pay | Admitting: General Practice

## 2021-04-14 NOTE — Progress Notes (Signed)
[place] CSW PRogress Notes  Referral sent to [employer] for transport assistance.  Edwyna Shell, LCSW Clinical Social Worker Phone:  [phone]

## 2021-04-23 NOTE — Progress Notes (Signed)
RN left voicemail to introduce as the Prostate Nurse Navigator.  Several attempts have been made to connect with patient since he has high acuity needs and multiple barriers.  Contact information left for patient to call back.

## 2021-04-30 ENCOUNTER — Telehealth: Payer: Self-pay | Admitting: General Practice

## 2021-04-30 NOTE — Telephone Encounter (Signed)
[place] CSW Progress Notes  Call from patient, "I have so many needs."  He is worried about being evicted.  Gets Social Security, "it is very limited."  Wants CSW to ask [facility] RN CM to call him - will message Aida Raider.    Reviewed chart, at this point, patient does not have upcoming appointments at [facility] and does not have treatment plan in place.  Thus is not eligible for [employer].  Will defer to [facility] RN CM and PCP for help w resources as he has not started care here.  Can engage when he begins treatment with [place].  Secure chat sent to Francesco Runner RN, prostate navigator and Lucie Leather, [place] Managed Care RN CM.  Edwyna Shell, LCSW Clinical Social Worker Phone:  [phone]

## 2021-05-03 ENCOUNTER — Other Ambulatory Visit: Payer: Self-pay

## 2021-05-03 ENCOUNTER — Other Ambulatory Visit: Payer: Self-pay | Admitting: Obstetrics and Gynecology

## 2021-05-03 DIAGNOSIS — E119 Type 2 diabetes mellitus without complications: Secondary | ICD-10-CM

## 2021-05-03 NOTE — Patient Outreach (Signed)
Care Coordination  05/03/2021  Troy Mclaughlin [facility] 1956/02/04 [id number]  RNCM placed referral at SW request for housing resources, information to address current need.  Aida Raider RN, BSN [place]  [employer] - Managed Medicaid High Risk [phone].

## 2021-05-04 ENCOUNTER — Telehealth: Payer: Self-pay | Admitting: Nutrition

## 2021-05-04 ENCOUNTER — Telehealth: Payer: Self-pay

## 2021-05-04 DIAGNOSIS — C61 Malignant neoplasm of prostate: Secondary | ICD-10-CM

## 2021-05-04 NOTE — Progress Notes (Signed)
RN spoke with patient to assess navigation needs.  Pt verbalized need for assistance with transportation, financial needs, and verbalized lack of appetite.    Pt stated he goes through times when he just doesn't want to eat, things don't taste good to him.  He was in agreement to proceed with a dietitian referral.    RN will assist with coordination of transportation once appointments for daily radiation are set.    Patient has been contacted by social work, and once we get patient started on treatment will be able to provide assistance for [employer].

## 2021-05-04 NOTE — Telephone Encounter (Signed)
° °  Telephone encounter was:  Successful.  05/04/2021 Name: Troy Mclaughlin MRN: [id number] DOB: 1956/03/25  Troy Mclaughlin is a 65 y.o. year old male who is a primary care patient of Wynetta Emery, Dalbert Batman, MD . The community resource team was consulted for assistance with  housing  Care guide performed the following interventions: Spoke with patient about [employer] and [place] for Housing and Community/Eviction Mediation.  Follow Up Plan:  Care guide will follow up with patient by phone over the next 7-67 days  Johnnetta Holstine, AAS Paralegal, [employer]  [address], [place] [address] ??[email or web address]   ?? [phone]   [email or web address]

## 2021-05-04 NOTE — Telephone Encounter (Signed)
Scheduled per sch msg. Called and spoke with patient. Confirmed appt  

## 2021-05-05 ENCOUNTER — Encounter (HOSPITAL_COMMUNITY): Payer: Self-pay

## 2021-05-05 ENCOUNTER — Emergency Department (HOSPITAL_COMMUNITY): Payer: Medicaid Other

## 2021-05-05 ENCOUNTER — Other Ambulatory Visit: Payer: Self-pay

## 2021-05-05 ENCOUNTER — Emergency Department (HOSPITAL_COMMUNITY)
Admission: EM | Admit: 2021-05-05 | Discharge: 2021-05-05 | Disposition: A | Discharge status: Home / Self Care | Payer: Medicaid Other | Attending: Emergency Medicine | Admitting: Emergency Medicine

## 2021-05-05 DIAGNOSIS — I10 Essential (primary) hypertension: Secondary | ICD-10-CM | POA: Insufficient documentation

## 2021-05-05 DIAGNOSIS — Z79899 Other long term (current) drug therapy: Secondary | ICD-10-CM | POA: Insufficient documentation

## 2021-05-05 DIAGNOSIS — H538 Other visual disturbances: Secondary | ICD-10-CM | POA: Diagnosis not present

## 2021-05-05 DIAGNOSIS — Z8546 Personal history of malignant neoplasm of prostate: Secondary | ICD-10-CM | POA: Insufficient documentation

## 2021-05-05 DIAGNOSIS — M542 Cervicalgia: Secondary | ICD-10-CM | POA: Diagnosis not present

## 2021-05-05 DIAGNOSIS — R0602 Shortness of breath: Secondary | ICD-10-CM | POA: Diagnosis not present

## 2021-05-05 DIAGNOSIS — F17210 Nicotine dependence, cigarettes, uncomplicated: Secondary | ICD-10-CM | POA: Diagnosis not present

## 2021-05-05 DIAGNOSIS — E119 Type 2 diabetes mellitus without complications: Secondary | ICD-10-CM | POA: Diagnosis not present

## 2021-05-05 DIAGNOSIS — R519 Headache, unspecified: Secondary | ICD-10-CM | POA: Diagnosis not present

## 2021-05-05 DIAGNOSIS — R42 Dizziness and giddiness: Secondary | ICD-10-CM | POA: Insufficient documentation

## 2021-05-05 DIAGNOSIS — Z955 Presence of coronary angioplasty implant and graft: Secondary | ICD-10-CM | POA: Insufficient documentation

## 2021-05-05 DIAGNOSIS — R0789 Other chest pain: Secondary | ICD-10-CM | POA: Diagnosis not present

## 2021-05-05 DIAGNOSIS — I2510 Atherosclerotic heart disease of native coronary artery without angina pectoris: Secondary | ICD-10-CM | POA: Insufficient documentation

## 2021-05-05 LAB — COMPREHENSIVE METABOLIC PANEL
ALT: 18 U/L (ref 0–44)
AST: 23 U/L (ref 15–41)
Albumin: 3.8 g/dL (ref 3.5–5.0)
Alkaline Phosphatase: 89 U/L (ref 38–126)
Anion gap: 6 (ref 5–15)
BUN: 8 mg/dL (ref 8–23)
CO2: 28 mmol/L (ref 22–32)
Calcium: 8.9 mg/dL (ref 8.9–10.3)
Chloride: 99 mmol/L (ref 98–111)
Creatinine, Ser: 0.77 mg/dL (ref 0.61–1.24)
GFR, Estimated: >60 mL/min (ref >60)
Glucose, Bld: 108 mg/dL — ABNORMAL HIGH (ref 70–99)
Potassium: 4 mmol/L (ref 3.5–5.1)
Sodium: 133 mmol/L — ABNORMAL LOW (ref 135–145)
Total Bilirubin: 0.6 mg/dL (ref 0.3–1.2)
Total Protein: 7.5 g/dL (ref 6.5–8.1)

## 2021-05-05 LAB — CBC
HCT: 39.4 % (ref 39.0–52.0)
Hemoglobin: 13 g/dL (ref 13.0–17.0)
MCH: 25.2 pg — ABNORMAL LOW (ref 26.0–34.0)
MCHC: 33 g/dL (ref 30.0–36.0)
MCV: 76.5 fL — ABNORMAL LOW (ref 80.0–100.0)
Platelets: 168 10*3/uL (ref 150–400)
RBC: 5.15 MIL/uL (ref 4.22–5.81)
RDW: 13.8 % (ref 11.5–15.5)
WBC: 5 10*3/uL (ref 4.0–10.5)
nRBC: 0 % (ref 0.0–0.2)

## 2021-05-05 LAB — TROPONIN I (HIGH SENSITIVITY)
Troponin I (High Sensitivity): 5 ng/L (ref <18)
Troponin I (High Sensitivity): 4 ng/L (ref <18)

## 2021-05-05 MED ORDER — ACETAMINOPHEN 500 MG PO TABS: 1000.0000 mg | ORAL_TABLET | Freq: Four times a day (QID) | ORAL | 0 refills | Status: DC | PRN

## 2021-05-05 MED ORDER — ACETAMINOPHEN 500 MG PO TABS
1000.0000 mg | ORAL_TABLET | Freq: Once | ORAL | Status: AC
  Administered 2021-05-05: 09:00:00 1000 mg via ORAL
  Filled 2021-05-05: qty 2

## 2021-05-05 NOTE — ED Notes (Signed)
Patient transported to CT 
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
## 2021-05-06 ENCOUNTER — Telehealth: Payer: Self-pay

## 2021-05-06 DIAGNOSIS — Z599 Problem related to housing and economic circumstances, unspecified: Secondary | ICD-10-CM

## 2021-05-06 NOTE — Telephone Encounter (Signed)
Transition Care Management Unsuccessful Follow-up Telephone Call  Date of discharge and from where:  05/05/2021 from [facility]  Attempts:  1st Attempt  Reason for unsuccessful TCM follow-up call:  Left voice message

## 2021-05-07 NOTE — Progress Notes (Signed)
RN spoke with patient to follow up on navigation needs.  Pt originally thought that he was scheduled at [facility] on 1/3 for fiducial markers.  RN updated that we are currently still waiting for this to be scheduled, verbalized understanding.  Pt reports that he will require assistance with transportation.  RN explained we could assist with this need, once appointments are scheduled we can facilitate arrangements.  Verbalized understanding.  RN will continue to follow up with patient to ensure needs are met.

## 2021-05-07 NOTE — Telephone Encounter (Signed)
Transition Care Management Follow-up Telephone Call Date of discharge and from where: 05/05/2021-[facility] How have you been since you were released from the hospital? Patient stated he is doing ok. Still has some head pain but will call PCP to follow up. Patient has some financial barrier with getting medications that are prescribed.  Any questions or concerns? No  Items Reviewed: Did the pt receive and understand the discharge instructions provided? Yes  Medications obtained and verified? Yes  Other? No  Any new allergies since your discharge? No  Dietary orders reviewed? No Do you have support at home? Yes   Home Care and Equipment/Supplies: Were home health services ordered? not applicable If so, what is the name of the agency? N/A  Has the agency set up a time to come to the patient's home? not applicable Were any new equipment or medical supplies ordered?  No What is the name of the medical supply agency? N/A Were you able to get the supplies/equipment? not applicable Do you have any questions related to the use of the equipment or supplies? No  Functional Questionnaire: (I = Independent and D = Dependent) ADLs: I  Bathing/Dressing- I  Meal Prep- I  Eating- I  Maintaining continence- I  Transferring/Ambulation- I  Managing Meds- I  Follow up appointments reviewed:  PCP Hospital f/u appt confirmed? No   Specialist Hospital f/u appt confirmed? No   Are transportation arrangements needed? No  If their condition worsens, is the pt aware to call PCP or go to the Emergency Dept.? Yes Was the patient provided with contact information for the PCP's office or ED? Yes Was to pt encouraged to call back with questions or concerns? Yes  Referral has been sent for financial assistance

## 2021-05-11 ENCOUNTER — Ambulatory Visit: Payer: Self-pay

## 2021-05-11 ENCOUNTER — Telehealth: Payer: Self-pay | Admitting: Pharmacist

## 2021-05-11 NOTE — Patient Outreach (Signed)
05/11/2021 Name: Troy Mclaughlin MRN: [id number] DOB: 13-Jan-1956  Referred by: [name], MD Reason for referral : No chief complaint on file.   An unsuccessful telephone outreach was attempted today. The patient was referred to the case management team for assistance with care management and care coordination.    Follow Up Plan: The Managed Medicaid care management team will reach out to the patient again over the next 10 days.   Hughes Better PharmD, CPP High Risk Managed Medicaid [place] [phone]

## 2021-05-12 ENCOUNTER — Other Ambulatory Visit: Payer: Self-pay | Admitting: Urology

## 2021-05-13 ENCOUNTER — Encounter: Payer: Self-pay | Admitting: Nutrition

## 2021-05-13 ENCOUNTER — Inpatient Hospital Stay: Payer: Medicaid Other | Attending: Radiation Oncology | Admitting: Nutrition

## 2021-05-13 NOTE — Progress Notes (Signed)
Patient did not show up for nutrition appointment. 

## 2021-05-18 NOTE — Progress Notes (Signed)
Spoke with patient by phone and he states he needs overnight stay due to transportation and no overnight caregiver issues, left message with connie mabe to call pt at home number.

## 2021-05-19 NOTE — Progress Notes (Signed)
Troy Mclaughlin spoke with pt and pt aware needs caregiver and driver dos per [employer]

## 2021-05-25 ENCOUNTER — Telehealth: Payer: Self-pay

## 2021-05-25 NOTE — Telephone Encounter (Signed)
Call made to conduct transportation screening. Was unable to reach patient. LVM.

## 2021-05-27 NOTE — Progress Notes (Signed)
RN placed call to patient to assess navigation needs. RN informed patient that transportation assistance will be available for him for CT Sim and upcoming radiation appointments.  I educated patient that transportation can not be booked out sooner than 2 weeks.    Confirmed that pt is scheduled for fiducial markers and [employer] on 06/17/2021 @ [facility], pt verbalized daughter will be taking him to this appointment.   Pt aware that RN will work on transportation and be in contact in the future.  Verbalized understanding.  Spoke with wife as well, verbalized understanding.  They both have my direct number for any future questions or concerns that may arise.

## 2021-05-28 ENCOUNTER — Encounter: Payer: Self-pay | Admitting: Urology

## 2021-05-28 ENCOUNTER — Telehealth: Payer: Self-pay

## 2021-05-28 NOTE — Telephone Encounter (Signed)
° °  Telephone encounter was:  Successful.  05/28/2021 Name: Troy Mclaughlin MRN: [id number] DOB: 12/14/1955  [name] is a 66 y.o. year old male who is a primary care patient of [name], MD . The community resource team was consulted for assistance with  housing.  Care guide performed the following interventions: Spoke with patient about contacting the [employer] for assistance completing an Land for [employer].  The waitlist for senior housing is open and accepting applications. Patient stated he was able to call them without assistance.   Follow Up Plan:  No further follow up planned at this time. The patient has been provided with needed resources.  Dishawn Bhargava, AAS Paralegal, [employer]  [address], [place] [address] ??[email or web address]   ?? [phone]   [email or web address]

## 2021-06-01 ENCOUNTER — Other Ambulatory Visit: Payer: Self-pay

## 2021-06-01 ENCOUNTER — Other Ambulatory Visit: Payer: Self-pay | Admitting: Urology

## 2021-06-01 NOTE — Progress Notes (Deleted)
Opened in error

## 2021-06-01 NOTE — Progress Notes (Signed)
Patient scheduled for fiducials and SpaceOAR gel 06/17/21 and CT SIM on 06/22/21 in anticipation of beginning his radiation treatments shortly thereafter. Patient started ADT in 03/2021.  Nicholos Johns, MMS, PA-C [facility]: [phone]   Fax: [phone]

## 2021-06-01 NOTE — Patient Instructions (Signed)
Visit Information  [name] was given information about Medicaid Managed Care team care coordination services as a part of their [place] Medicaid benefit. Troy Mclaughlin verbally consented to engagement with the [facility] Managed Care team.   If you are experiencing a medical emergency, please call 911 or report to your local emergency department or urgent care.   If you have a non-emergency medical problem during routine business hours, please contact your provider's office and ask to speak with a nurse.   For questions related to your [employer], please call: [phone] or visit the homepage here: [email or web address]  If you would like to schedule transportation through your [facility], please call the following number at least 2 days in advance of your appointment: [phone].   Call the [place] at [phone], at any time, 24 hours a day, 7 days a week. If you are in danger or need immediate medical attention call 911.  If you would like help to quit smoking, call 1-800-QUIT-NOW (731) 085-9057) OR Espaol: 1-855-Djelo-Ya (9-379-024-0973) o para ms informacin haga clic aqu or Text READY to 200-400 to register via text  [name] - following are the goals we discussed in your visit today:   Goals Addressed   None     Social Worker will follow up in 14 days .   [name], BSW, [place] Managed Medicaid Team  [phone]   Following is a copy of your plan of care:  There are no care plans that you recently modified to display for this patient.

## 2021-06-01 NOTE — Patient Outreach (Signed)
Medicaid Managed Care Social Work Note  06/01/2021 Name:  Troy Mclaughlin MRN:  [id number] DOB:  January 23, 1956  Troy Mclaughlin is an 66 y.o. year old male who is a primary patient of [name], MD.  The [place] team was consulted for assistance with:  Community Resources   Troy Mclaughlin was given information about Medicaid Managed Care Coordination team services today. Troy Mclaughlin Patient agreed to services and verbal consent obtained.  Engaged with patient  for by telephone forfollow up visit in response to referral for case management and/or care coordination services.   Assessments/Interventions:  Review of past medical history, allergies, medications, health status, including review of consultants reports, laboratory and other test data, was performed as part of comprehensive evaluation and provision of chronic care management services.  SDOH: (Social Determinant of Health) assessments and interventions performed: BSW contacted patient regarding transportation and medication. Patient states he has a surgery coming up and some appointments after. Patient states his daughter will take him, but he needs transportation to his follow up appointments. BSW provided patient with the telephone number for [facility] transportation. Patient may not qualify for medication assistance due to him MM.    Advanced Directives Status:  Not addressed in this encounter.  Care Plan                 Allergies  Allergen Reactions   Lisinopril Anaphylaxis and Swelling    angioedema   Nsaids Other (See Comments)    Bleeding ulcers    Medications Reviewed Today     Reviewed by Marcell Barlow, CPhT (Pharmacy Technician) on 05/05/21 at [place] List Status: Being Discharged   Medication Order Taking? Sig Documenting Provider Last Dose Status Informant   Patient not taking:   Discontinued 05/05/21 1228 amLODipine (NORVASC) 5 MG tablet [id number]  TAKE ONE TABLET BY MOUTH EVERY  MORNING [name], MD  Active   atorvastatin (LIPITOR) 10 MG tablet [id number]  TAKE ONE TABLET BY MOUTH EVERY MORNING  Patient taking differently: Take 10 mg by mouth daily.   [name], MD  Active Self   Patient not taking:   Discontinued 05/05/21 1228 carvedilol (COREG) 12.5 MG tablet [id number]  TAKE 1/2 TABLET BY MOUTH TWICE DAILY WITH A MEAL [name], MD  Active   ciprofloxacin (CIPRO) 500 MG tablet [id number]  Take 1 tablet (500 mg total) by mouth 2 (two) times daily. [name], MD  Active    Patient not taking:   Discontinued 05/05/21 1228 loperamide (IMODIUM A-D) 2 MG tablet [id number]  Take 1 tablet (2 mg total) by mouth 3 (three) times daily as needed for diarrhea or loose stools. [name], MD  Active    Patient not taking:   Discontinued 05/05/21 1228 nicotine (NICODERM CQ - DOSED IN MG/24 HOURS) 14 mg/24hr patch [id number]  Place 1 patch (14 mg total) onto the skin daily. [name], MD  Active Self  nitroGLYCERIN (NITROSTAT) 0.4 MG SL tablet [id number]  DISSOLVE 1 TABLET UNDER THE TONGUE EVERY 5 MINUTES AS NEEDED FOR CHEST PAIN. DO NOT EXCEED A TOTAL OF 3 DOSES IN 15 MINUTES. [name], MD  Active   ondansetron [facility]) 4 MG tablet [id number]  Take 1 tablet (4 mg total) by mouth every 8 (eight) hours as needed for nausea or vomiting. [name], MD  Active   pantoprazole (PROTONIX) 40 MG tablet [id number]  Take 1 tablet (40 mg  total) by mouth daily.  Patient not taking: No sig reported   Sherwood Gambler, MD  Active Self  polyvinyl alcohol (LIQUIFILM TEARS) 1.4 % ophthalmic solution [id number]  Place 1 drop into both eyes daily as needed for dry eyes. [provider]  Active Self  tamsulosin (FLOMAX) 0.4 MG CAPS capsule [id number]  TAKE ONE CAPSULE BY MOUTH ONCE DAILY [name], MD  Active   traMADol (ULTRAM) 50 MG tablet [id number]  Take 1 tablet (50 mg total) by mouth daily as needed.  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[place])    Diabetes mellitus without complication ([place])    Alcohol abuse    Acute gastric ulcer with hemorrhage    GI bleed 03/30/2019   Abdominal pain, left lower quadrant    Blood in the stool    Alcoholic gastritis 43/15/4008   Non-intractable vomiting 05/27/2018   Hyperkalemia 05/27/2018   Erectile dysfunction 02/09/2017   Tobacco dependence 02/09/2017   Hyperlipidemia 01/18/2017   Gastroesophageal reflux disease with esophagitis    Renal stone    Type 2 diabetes mellitus without complication, without long-term current use of insulin ([place]) 08/16/2016   Abdominal wall pain    Coronary artery disease involving native coronary artery of native heart without angina pectoris    Atypical chest pain    Essential hypertension    History of ST elevation myocardial infarction (STEMI) 07/06/2013   History of small bowel obstruction 07/29/2008   GLAUCOMA 04/15/2008    Conditions to be addressed/monitored per PCP order:   transportation  There are no care plans that you recently modified to display for this patient.   Follow up:  Patient agrees to Care Plan and Follow-up.  Plan: The Managed Medicaid care management team will reach out to the patient again over the next 14 days.  Date/time of next scheduled Social Work care management/care coordination outreach:  06/21/21  Troy Mclaughlin, Arita Miss, [place] Medicaid Team  [phone]

## 2021-06-02 ENCOUNTER — Telehealth: Payer: Self-pay | Admitting: Internal Medicine

## 2021-06-02 NOTE — Telephone Encounter (Signed)
-----   Message from Hughes Better, [place] sent at 06/01/2021 11:09 AM EST ----- Good morning Dr. Wynetta Emery,  I just spoke with one of your patients, Troy Mclaughlin, as part of the high risk managed medicaid team. He was previously being followed by the other pharmacist on the team. When I spoke with him today he stated he has been out of all of his medications for 2 months due to barriers surrounding cost. I am trying to alleviate that by switching patient back to [place], but I do not think the patient should just re-start all of his medications without any follow-up. My concern is even if a follow-up is scheduled he still may not due to barriers (transportation, cost, etc). He is also requesting inhalers for which I stated you would likely not prescribe without seeing him. I am not sure how you would like to proceed.   Thanks! Hughes Better PharmD, CPP High Risk Managed Medicaid [place] [phone]

## 2021-06-08 NOTE — Progress Notes (Signed)
RN coordinated transportation for upcoming CT Sim appointment on 1/31 @ 9am.   Ride ID:  [id number]  Transportation Type:  RIDESHARE (UBER/LYFT)  RN left voicemail for patient requesting call back to update patient regarding transportation.

## 2021-06-11 NOTE — Progress Notes (Signed)
Left voicemail for call back regarding transportation for upcoming appointment.

## 2021-06-14 ENCOUNTER — Other Ambulatory Visit: Payer: Self-pay

## 2021-06-14 ENCOUNTER — Encounter (HOSPITAL_BASED_OUTPATIENT_CLINIC_OR_DEPARTMENT_OTHER): Payer: Self-pay | Admitting: Urology

## 2021-06-14 NOTE — Progress Notes (Signed)
Spoke w/ via phone for pre-op interview---pt Lab needs dos---- I stat               Lab results------see below COVID test -----patient states asymptomatic no test needed Arrive at -------630 am 06-17-2021 NPO after MN NO Solid Food.  Clear liquids from MN until---530 am Med rec completed Medications to take morning of surgery -----none Diabetic medication -----n/a Patient instructed no nail polish to be worn day of surgery Patient instructed to bring photo id and insurance card day of surgery Patient aware to have Driver (ride ) / caregiver    for 24 hours after surgery  driver to surgery step daughter Velora Heckler cell [phone] driver home: step daughter Luberta Mutter cell [phone]    caregiver @ home wife Loletta Specter Patient Special Instructions -----no smoking 24 hours before surgery Pre-Op special Istructions -----fleets enema am of surgery Patient verbalized understanding of instructions that were given at this phone interview. Patient denies shortness of breath, chest pain, fever, cough at this phone interview.   Ekg 05-05-2021 chart/epic Echo 01-16-2016 epic Stress test 05-19-2014 epic Ct maxillofacial 05-05-2021 epic Ct head 05-05-2021 epic Chest xray 1 view 05-05-2021 epic  Reviewed pt history and pt states off all blood pressure medications x 2 weeks due to cannot afford the medications per pt with dr Johnette Abraham. Howze mda and pt is to consume usual amount of alocohol night before surgery per dr Zenia Resides mda ok to use cmet done 05-05-2021 epic for liver enzymes for 06-17-2021 surgery per dr Daiva Huge and pt ok for surgery 06-17-2021 @ [facility] per dr Erasmo Downer.   Called connie at [facility] urology and made aware pt drinks= 40 ounces of alcohol daily and does dr borden want pt to consume 40 ounce of alcohol night before surgery.  Marlowe Kays at [facility] urology left message pt is to drink his normal amount of alcohol night before surgery per dr Alinda Money, pt called and instructed to drink his  usual amount of alcohol night before surgery and patient vocalized understanding.  Spoke with daughter Signa Kell cell by phone and confirmed she will pick pt up and driver him home after surgery cell number is [phone]. For [employer]. Daughter Santina Evans is driver to [employer] [phone], wife Andee Poles will be caregiver at home night of surgery

## 2021-06-16 NOTE — Progress Notes (Signed)
°  Radiation Oncology         [phone] ________________________________  Name: Troy Mclaughlin MRN: [id number]  Date: 06/22/2021  DOB: 1956/03/13  SIMULATION AND TREATMENT PLANNING NOTE    ICD-10-CM   1. Malignant neoplasm of prostate ([place])  C61       DIAGNOSIS:  66 y.o. 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  NARRATIVE:  The patient was brought to the [facility].  Identity was confirmed.  All relevant records and images related to the planned course of therapy were reviewed.  The patient freely provided informed written consent to proceed with treatment after reviewing the details related to the planned course of therapy. The consent form was witnessed and verified by the simulation staff.  Then, the patient was set-up in a stable reproducible supine position for radiation therapy.  A vacuum lock pillow device was custom fabricated to position his legs in a reproducible immobilized position.  Then, I performed a urethrogram under sterile conditions to identify the prostatic bed.  CT images were obtained.  Surface markings were placed.  The CT images were loaded into the planning software.  Then the prostate bed target, pelvic lymph node target and avoidance structures including the rectum, bladder, bowel and hips were contoured.  Treatment planning then occurred.  The radiation prescription was entered and confirmed.  A total of one complex treatment devices were fabricated. I have requested : Intensity Modulated Radiotherapy (IMRT) is medically necessary for this case for the following reason:  Rectal sparing.[name]  PLAN:  The patient will receive 45 Gy in 25 fractions of 1.8 Gy, followed by a boost to the prostate to a total dose of 75 Gy with 15 additional fractions of 2 Gy.   ________________________________  Sheral Apley Tammi Klippel, M.D.
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
## 2021-06-16 NOTE — Anesthesia Preprocedure Evaluation (Addendum)
Anesthesia Evaluation  Patient identified by MRN, date of birth, ID band Patient awake    Reviewed: Allergy & Precautions, H&P , NPO status , Patient's Chart, lab work & pertinent test results, reviewed documented beta blocker date and time   Airway Mallampati: II  TM Distance: >3 FB Neck ROM: Full    Dental no notable dental hx. (+) Teeth Intact, Dental Advisory Given   Pulmonary neg pulmonary ROS, Current Smoker and Patient abstained from smoking.,    Pulmonary exam normal breath sounds clear to auscultation       Cardiovascular Exercise Tolerance: Good hypertension, Pt. on medications and Pt. on home beta blockers + CAD and + Past MI   Rhythm:Regular Rate:Normal     Neuro/Psych  Headaches, CVA, No Residual Symptoms negative psych ROS   GI/Hepatic Neg liver ROS, PUD, GERD  ,  Endo/Other  diabetes  Renal/GU negative Renal ROS  negative genitourinary   Musculoskeletal   Abdominal   Peds  Hematology negative hematology ROS (+)   Anesthesia Other Findings   Reproductive/Obstetrics negative OB ROS                           Anesthesia Physical Anesthesia Plan  ASA: 3  Anesthesia Plan: MAC   Post-op Pain Management: Tylenol PO (pre-op)   Induction: Intravenous  PONV Risk Score and Plan: 2 and Ondansetron, Dexamethasone, Propofol infusion and Midazolam  Airway Management Planned: Natural Airway and Simple Face Mask  Additional Equipment:   Intra-op Plan:   Post-operative Plan:   Informed Consent: I have reviewed the patients History and Physical, chart, labs and discussed the procedure including the risks, benefits and alternatives for the proposed anesthesia with the patient or authorized representative who has indicated his/her understanding and acceptance.     Dental advisory given  Plan Discussed with: CRNA  Anesthesia Plan Comments:        Anesthesia Quick  Evaluation

## 2021-06-17 ENCOUNTER — Ambulatory Visit (HOSPITAL_BASED_OUTPATIENT_CLINIC_OR_DEPARTMENT_OTHER): Payer: Medicaid Other | Admitting: Anesthesiology

## 2021-06-17 ENCOUNTER — Encounter: Payer: Self-pay | Admitting: Licensed Clinical Social Worker

## 2021-06-17 ENCOUNTER — Encounter (HOSPITAL_BASED_OUTPATIENT_CLINIC_OR_DEPARTMENT_OTHER)
Admission: RE | Disposition: A | Discharge status: Home / Self Care | Payer: Self-pay | Source: Home / Self Care | Attending: Urology

## 2021-06-17 ENCOUNTER — Other Ambulatory Visit: Payer: Self-pay

## 2021-06-17 ENCOUNTER — Encounter (HOSPITAL_BASED_OUTPATIENT_CLINIC_OR_DEPARTMENT_OTHER): Payer: Self-pay | Admitting: Urology

## 2021-06-17 ENCOUNTER — Ambulatory Visit (HOSPITAL_BASED_OUTPATIENT_CLINIC_OR_DEPARTMENT_OTHER)
Admission: RE | Admit: 2021-06-17 | Discharge: 2021-06-17 | Disposition: A | Discharge status: Home / Self Care | Payer: Medicaid Other | Attending: Urology | Admitting: Urology

## 2021-06-17 DIAGNOSIS — N5201 Erectile dysfunction due to arterial insufficiency: Secondary | ICD-10-CM | POA: Insufficient documentation

## 2021-06-17 DIAGNOSIS — R350 Frequency of micturition: Secondary | ICD-10-CM | POA: Diagnosis not present

## 2021-06-17 DIAGNOSIS — N486 Induration penis plastica: Secondary | ICD-10-CM | POA: Diagnosis not present

## 2021-06-17 DIAGNOSIS — Z8673 Personal history of transient ischemic attack (TIA), and cerebral infarction without residual deficits: Secondary | ICD-10-CM | POA: Diagnosis not present

## 2021-06-17 DIAGNOSIS — I252 Old myocardial infarction: Secondary | ICD-10-CM | POA: Insufficient documentation

## 2021-06-17 DIAGNOSIS — I2510 Atherosclerotic heart disease of native coronary artery without angina pectoris: Secondary | ICD-10-CM | POA: Insufficient documentation

## 2021-06-17 DIAGNOSIS — I10 Essential (primary) hypertension: Secondary | ICD-10-CM | POA: Insufficient documentation

## 2021-06-17 DIAGNOSIS — C61 Malignant neoplasm of prostate: Secondary | ICD-10-CM | POA: Diagnosis not present

## 2021-06-17 DIAGNOSIS — K279 Peptic ulcer, site unspecified, unspecified as acute or chronic, without hemorrhage or perforation: Secondary | ICD-10-CM | POA: Insufficient documentation

## 2021-06-17 DIAGNOSIS — F17210 Nicotine dependence, cigarettes, uncomplicated: Secondary | ICD-10-CM | POA: Diagnosis not present

## 2021-06-17 DIAGNOSIS — E119 Type 2 diabetes mellitus without complications: Secondary | ICD-10-CM | POA: Diagnosis not present

## 2021-06-17 DIAGNOSIS — N401 Enlarged prostate with lower urinary tract symptoms: Secondary | ICD-10-CM | POA: Diagnosis not present

## 2021-06-17 HISTORY — DX: Dependence on other enabling machines and devices: Z99.89

## 2021-06-17 HISTORY — DX: Presence of spectacles and contact lenses: Z97.3

## 2021-06-17 HISTORY — PX: SPACE OAR INSTILLATION: SHX6769

## 2021-06-17 HISTORY — PX: GOLD SEED IMPLANT: SHX6343

## 2021-06-17 LAB — POCT I-STAT, CHEM 8
BUN: 14 mg/dL (ref 8–23)
BUN: 16 mg/dL (ref 8–23)
Calcium, Ion: 1.25 mmol/L (ref 1.15–1.40)
Calcium, Ion: 1.28 mmol/L (ref 1.15–1.40)
Chloride: 100 mmol/L (ref 98–111)
Chloride: 102 mmol/L (ref 98–111)
Creatinine, Ser: 0.8 mg/dL (ref 0.61–1.24)
Creatinine, Ser: 0.9 mg/dL (ref 0.61–1.24)
Glucose, Bld: 75 mg/dL (ref 70–99)
Glucose, Bld: 86 mg/dL (ref 70–99)
HCT: 40 % (ref 39.0–52.0)
HCT: 41 % (ref 39.0–52.0)
Hemoglobin: 13.6 g/dL (ref 13.0–17.0)
Hemoglobin: 13.9 g/dL (ref 13.0–17.0)
Potassium: 4.4 mmol/L (ref 3.5–5.1)
Potassium: 5.5 mmol/L — ABNORMAL HIGH (ref 3.5–5.1)
Sodium: 136 mmol/L (ref 135–145)
Sodium: 137 mmol/L (ref 135–145)
TCO2: 27 mmol/L (ref 22–32)
TCO2: 28 mmol/L (ref 22–32)

## 2021-06-17 SURGERY — INSERTION, GOLD SEEDS
Anesthesia: Monitor Anesthesia Care | Site: Prostate

## 2021-06-17 MED ORDER — CEFAZOLIN SODIUM-DEXTROSE 2-4 GM/100ML-% IV SOLN
INTRAVENOUS | Status: AC
  Filled 2021-06-17: qty 100

## 2021-06-17 MED ORDER — WHITE PETROLATUM EX OINT
TOPICAL_OINTMENT | CUTANEOUS | Status: AC
  Filled 2021-06-17: qty 5

## 2021-06-17 MED ORDER — HYDRALAZINE HCL 20 MG/ML IJ SOLN
5.0000 mg | Freq: Once | INTRAMUSCULAR | Status: AC
  Administered 2021-06-17: 5 mg via INTRAVENOUS

## 2021-06-17 MED ORDER — PROPOFOL 10 MG/ML IV BOLUS
INTRAVENOUS | Status: DC | PRN
  Administered 2021-06-17: 50 mg via INTRAVENOUS

## 2021-06-17 MED ORDER — MIDAZOLAM HCL 2 MG/2ML IJ SOLN
INTRAMUSCULAR | Status: AC
  Filled 2021-06-17: qty 2

## 2021-06-17 MED ORDER — ONDANSETRON HCL 4 MG/2ML IJ SOLN
INTRAMUSCULAR | Status: AC
  Filled 2021-06-17: qty 2

## 2021-06-17 MED ORDER — FENTANYL CITRATE (PF) 100 MCG/2ML IJ SOLN: 25.0000 ug | INTRAMUSCULAR | Status: DC | PRN

## 2021-06-17 MED ORDER — BUPIVACAINE HCL 0.25 % IJ SOLN
INTRAMUSCULAR | Status: DC | PRN
  Administered 2021-06-17: 15 mL

## 2021-06-17 MED ORDER — FENTANYL CITRATE (PF) 100 MCG/2ML IJ SOLN
INTRAMUSCULAR | Status: DC | PRN
  Administered 2021-06-17: 50 ug via INTRAVENOUS

## 2021-06-17 MED ORDER — FENTANYL CITRATE (PF) 100 MCG/2ML IJ SOLN
INTRAMUSCULAR | Status: AC
  Filled 2021-06-17: qty 2

## 2021-06-17 MED ORDER — LIDOCAINE HCL (PF) 2 % IJ SOLN
INTRAMUSCULAR | Status: AC
  Filled 2021-06-17: qty 5

## 2021-06-17 MED ORDER — ONDANSETRON HCL 4 MG/2ML IJ SOLN
INTRAMUSCULAR | Status: DC | PRN
  Administered 2021-06-17: 4 mg via INTRAVENOUS

## 2021-06-17 MED ORDER — LACTATED RINGERS IV SOLN: INTRAVENOUS | Status: DC

## 2021-06-17 MED ORDER — ACETAMINOPHEN 500 MG PO TABS
1000.0000 mg | ORAL_TABLET | Freq: Once | ORAL | Status: AC
  Administered 2021-06-17: 1000 mg via ORAL

## 2021-06-17 MED ORDER — PROPOFOL 10 MG/ML IV BOLUS
INTRAVENOUS | Status: AC
  Filled 2021-06-17: qty 20

## 2021-06-17 MED ORDER — ACETAMINOPHEN 500 MG PO TABS
ORAL_TABLET | ORAL | Status: AC
  Filled 2021-06-17: qty 2

## 2021-06-17 MED ORDER — FLEET ENEMA 7-19 GM/118ML RE ENEM: 1.0000 | ENEMA | Freq: Once | RECTAL | Status: DC

## 2021-06-17 MED ORDER — HYDRALAZINE HCL 20 MG/ML IJ SOLN
INTRAMUSCULAR | Status: AC
  Filled 2021-06-17: qty 1

## 2021-06-17 MED ORDER — DEXAMETHASONE SODIUM PHOSPHATE 10 MG/ML IJ SOLN
INTRAMUSCULAR | Status: AC
  Filled 2021-06-17: qty 1

## 2021-06-17 MED ORDER — PROPOFOL 500 MG/50ML IV EMUL
INTRAVENOUS | Status: DC | PRN
  Administered 2021-06-17: 200 ug/kg/min via INTRAVENOUS

## 2021-06-17 MED ORDER — SODIUM CHLORIDE (PF) 0.9 % IJ SOLN
INTRAMUSCULAR | Status: DC | PRN
  Administered 2021-06-17: 10 mL via INTRAVENOUS

## 2021-06-17 MED ORDER — PROPOFOL 500 MG/50ML IV EMUL
INTRAVENOUS | Status: AC
  Filled 2021-06-17: qty 50

## 2021-06-17 MED ORDER — LIDOCAINE 2% (20 MG/ML) 5 ML SYRINGE
INTRAMUSCULAR | Status: DC | PRN
  Administered 2021-06-17: 100 mg via INTRAVENOUS

## 2021-06-17 MED ORDER — CEFAZOLIN SODIUM-DEXTROSE 2-4 GM/100ML-% IV SOLN
2.0000 g | INTRAVENOUS | Status: AC
  Administered 2021-06-17: 2 g via INTRAVENOUS

## 2021-06-17 MED ORDER — MIDAZOLAM HCL 5 MG/5ML IJ SOLN
INTRAMUSCULAR | Status: DC | PRN
Start: 2021-06-17 — End: 2021-06-17
  Administered 2021-06-17: 2 mg via INTRAVENOUS

## 2021-06-17 SURGICAL SUPPLY — 18 items
DRSG TEGADERM 4X4.75 (GAUZE/BANDAGES/DRESSINGS) ×7 IMPLANT
DRSG TEGADERM 8X12 (GAUZE/BANDAGES/DRESSINGS) ×7 IMPLANT
GAUZE SPONGE 4X4 12PLY STRL LF (GAUZE/BANDAGES/DRESSINGS) ×1 IMPLANT
GLOVE SURG ENC MOIS LTX SZ7.5 (GLOVE) ×4 IMPLANT
GLOVE SURG ORTHO LTX SZ8.5 (GLOVE) ×4 IMPLANT
GOWN STRL REUS W/TWL LRG LVL3 (GOWN DISPOSABLE) ×4 IMPLANT
IMPL SPACEOAR VUE SYSTEM (Spacer) ×3 IMPLANT
IMPLANT SPACEOAR VUE SYSTEM (Spacer) ×3 IMPLANT
KIT TURNOVER CYSTO (KITS) ×4 IMPLANT
MARKER GOLD PRELOAD 1.2X3 (Urological Implant) ×3 IMPLANT
NDL SPNL 22GX7 QUINCKE BK (NEEDLE) ×3 IMPLANT
NEEDLE SPNL 22GX7 QUINCKE BK (NEEDLE) ×3 IMPLANT
PACK CYSTO (CUSTOM PROCEDURE TRAY) ×4 IMPLANT
SEED GOLD PRELOAD 1.2X3 (Urological Implant) ×3 IMPLANT
SURGILUBE 2OZ TUBE FLIPTOP (MISCELLANEOUS) ×4 IMPLANT
SYR 10ML LL (SYRINGE) ×1 IMPLANT
TOWEL OR 17X26 10 PK STRL BLUE (TOWEL DISPOSABLE) ×4 IMPLANT
UNDERPAD 30X36 HEAVY ABSORB (UNDERPADS AND DIAPERS) ×8 IMPLANT

## 2021-06-17 NOTE — Progress Notes (Signed)
[facility] CSW Progress Note  Holiday representative received referral from Engineer, site. CSW contact pt's daughter, Troy Mclaughlin, by phone. Introduced self and support services. Per daughter, biggest concern was transportation which has been arranged by nurse navigator. CSW also informed daughter about Medicaid transportation.  As pt is also working with [facility] care management, he has received local resources for food, housing, etc. No other needs at this time.  CSW will refer to financial resource advocates about the [employer] as pt is starting treatment.    Troy Mclaughlin , LCSW

## 2021-06-17 NOTE — Discharge Instructions (Addendum)
You should avoid strenuous activities today but may resume all normal activities tomorrow.  You can take Tylenol as needed for any pain or discomfort.  Follow up with your radiation oncologist for your simulation appointment as scheduled.  If this is not currently scheduled or you do not know the date/time for that appointment, please contact the radiation oncology office to confirm.    MAY HAVE NEXT TYLENOL DOSE AT 1:15 PM TODAY.      Post Anesthesia Home Care Instructions  Activity: Get plenty of rest for the remainder of the day. A responsible individual must stay with you for 24 hours following the procedure.  For the next 24 hours, DO NOT: -Drive a car -Paediatric nurse -Drink alcoholic beverages -Take any medication unless instructed by your physician -Make any legal decisions or sign important papers.  Meals: Start with liquid foods such as gelatin or soup. Progress to regular foods as tolerated. Avoid greasy, spicy, heavy foods. If nausea and/or vomiting occur, drink only clear liquids until the nausea and/or vomiting subsides. Call your physician if vomiting continues.  Special Instructions/Symptoms: Your throat may feel dry or sore from the anesthesia or the breathing tube placed in your throat during surgery. If this causes discomfort, gargle with warm salt water. The discomfort should disappear within 24 hours.  If you had a scopolamine patch placed behind your ear for the management of post- operative nausea and/or vomiting:  1. The medication in the patch is effective for 72 hours, after which it should be removed.  Wrap patch in a tissue and discard in the trash. Wash hands thoroughly with soap and water. 2. You may remove the patch earlier than 72 hours if you experience unpleasant side effects which may include dry mouth, dizziness or visual disturbances. 3. Avoid touching the patch. Wash your hands with soap and water after contact with the patch.

## 2021-06-17 NOTE — Op Note (Signed)
Preoperative diagnosis: Clinically localized adenocarcinoma of the prostate   Postoperative diagnosis: Clinically localized adenocarcinoma of the prostate    Procedure:  1) Fiducial marker placement into the prostate 2) Insertion of SpaceOAR hydrogel    Surgeon: Pryor Curia. M.D.   Anesthesia: IV sedation, Local   EBL: Minimal   Complications: None   Indication: The potential risks, complications, alternative options, and expected recovery course associated with the above procedure(s) have been discussed in detail with the patient and he has provided informed consent to proceed.   Description of procedure: The patient was administered preoperative antibiotics, placed in the dorsal lithotomy position, and prepped and draped in the usual sterile fashion. A preoperative time out was performed.  Next, transrectal ultrasonography was utilized to visualize the prostate. 10 cc of 0.25% bupivicaine was then used to infiltrate the subcuateous tissue of the perineum and an additional 10 cc was injected into the lateral apical tissue surrounding the prostate for a periprostatic nerve block.  Three gold fiducial markers were then placed into the prostate via transperineal needles under ultrasound guidance at the left apex, left base, and right mid gland under direct ultrasound guidance.  A site in the midline was then selected on the perineum for placement of an 18 g needle with saline.  The needle was advanced above the rectum and below Denonvillier's fascia to the mid gland and confirmed to be in the midline on transverse imaging.  One cc of saline was injected confirming appropriate expansion of this space.  A total of 5 cc of saline was then injected to open the space further bilaterally.  The saline syringe was then removed and the SpaceOAR hydrogel was injected with good distribution bilaterally. He tolerated the procedure well and without complications. He was able to be awakened and  transferred to the PACU in stable condition.

## 2021-06-17 NOTE — Progress Notes (Signed)
RN spoke with patient directly in PACU to address concerns related around transportation for upcoming CT Sim, due to having difficulty contact via telephone.   RN confirmed with patient that we have him set up for transportation on 1/31.  With permission from patient, I spoke with patient's daughter as well to update her on transportation.  Pt and daughter Rich Number) are both agreeable for her to be the main contact to confirm transportation appointments.  [employer] system updated.

## 2021-06-17 NOTE — Transfer of Care (Signed)
Immediate Anesthesia Transfer of Care Note  Patient: Troy Mclaughlin  Procedure(s) Performed: GOLD SEED IMPLANT (Prostate) SPACE OAR INSTILLATION (Perineum)  Patient Location: PACU  Anesthesia Type:MAC  Level of Consciousness: awake, alert , oriented and patient cooperative  Airway & Oxygen Therapy: Patient Spontanous Breathing  Post-op Assessment: Report given to RN and Post -op Vital signs reviewed and stable  Post vital signs: Reviewed and stable  Last Vitals:  Vitals Value Taken Time  BP 152/99 06/17/21 0830  Temp    Pulse 74 06/17/21 0831  Resp 13 06/17/21 0831  SpO2 100 % 06/17/21 0831  Vitals shown include unvalidated device data.  Last Pain:  Vitals:   06/17/21 0737  TempSrc: Oral  PainSc: 0-No pain      Patients Stated Pain Goal: 5 (29/24/46 2863)  Complications: No notable events documented.

## 2021-06-17 NOTE — Anesthesia Postprocedure Evaluation (Signed)
Anesthesia Post Note  Patient: Troy Mclaughlin  Procedure(s) Performed: GOLD SEED IMPLANT (Prostate) SPACE OAR INSTILLATION (Perineum)     Patient location during evaluation: PACU Anesthesia Type: MAC Level of consciousness: awake and alert Pain management: pain level controlled Vital Signs Assessment: post-procedure vital signs reviewed and stable Respiratory status: spontaneous breathing, nonlabored ventilation and respiratory function stable Cardiovascular status: stable and blood pressure returned to baseline Postop Assessment: no apparent nausea or vomiting Anesthetic complications: no   No notable events documented.  Last Vitals:  Vitals:   06/17/21 0909 06/17/21 0915  BP: (!) 164/108 (!) 192/96  Pulse:    Resp:  13  Temp:  36.6 C  SpO2:  98%    Last Pain:  Vitals:   06/17/21 0915  TempSrc:   PainSc: 0-No pain                 Emmanuel Gruenhagen,W. EDMOND

## 2021-06-18 ENCOUNTER — Encounter (HOSPITAL_BASED_OUTPATIENT_CLINIC_OR_DEPARTMENT_OTHER): Payer: Self-pay | Admitting: Urology

## 2021-06-18 NOTE — Progress Notes (Signed)
RN spoke to  patient and instructed him to call MD for pain medication.  He stated his pain was a 10.

## 2021-06-21 ENCOUNTER — Other Ambulatory Visit: Payer: Self-pay

## 2021-06-21 ENCOUNTER — Telehealth: Payer: Self-pay | Admitting: *Deleted

## 2021-06-21 NOTE — Telephone Encounter (Signed)
CALLED PATIENT TO REMIND OF SIM APPT. FOR 06-22-21- ARRIVAL TIME- 8:45 AM @ [facility], PATIENT TO ARRIVE WITH A FULL BLADDER AND AN EMPTY BOWEL, LVM FOR A RETURN CALL

## 2021-06-21 NOTE — Patient Outreach (Signed)
Medicaid Managed Care Social Work Note  06/21/2021 Name:  Troy Mclaughlin MRN:  [id number] DOB:  Dec 07, 1955  Troy Mclaughlin is an 66 y.o. year old male who is a primary patient of [name], MD.  The Medicaid Managed Care Coordination team was consulted for assistance with:  Transportation Needs   [name] was given information about Medicaid Managed Care Coordination team services today. Troy Mclaughlin Patient agreed to services and verbal consent obtained.  Engaged with patient  for by telephone forfollow up visit in response to referral for case management and/or care coordination services.   Assessments/Interventions:  Review of past medical history, allergies, medications, health status, including review of consultants reports, laboratory and other test data, was performed as part of comprehensive evaluation and provision of chronic care management services.  SDOH: (Social Determinant of Health) assessments and interventions performed: BSW completed follow up telephone call with patient's daughter. She states patient does not need any resources at this time. Bsw confirmed transportation pick up time for 06/22/21 appointment. No other resources needed.   Advanced Directives Status:  Not addressed in this encounter.  Care Plan                 Allergies  Allergen Reactions   Lisinopril Anaphylaxis and Swelling    angioedema    Medications Reviewed Today     Reviewed by Kerry Kass, RN (Registered Nurse) on 06/17/21 at [phone]  Med List Status: <None>   Medication Order Taking? Sig Documenting Provider Last Dose Status Informant  acetaminophen (TYLENOL) 500 MG tablet [id number] Yes Take 2 tablets (1,000 mg total) by mouth every 6 (six) hours as needed for moderate pain or mild pain. Domenic Moras, PA-C Past Week Active Self  amLODipine (NORVASC) 5 MG tablet [id number] Yes TAKE ONE TABLET BY MOUTH EVERY MORNING  Patient taking differently: Ran out of medication   [name], MD Past Week Active   atorvastatin (LIPITOR) 10 MG tablet [id number] Yes TAKE ONE TABLET BY MOUTH EVERY MORNING  Patient taking differently: Take 10 mg by mouth at bedtime.   [name], MD Past Week Active Self  carvedilol (COREG) 12.5 MG tablet [id number] Yes TAKE 1/2 TABLET BY MOUTH TWICE DAILY WITH A MEAL  Patient taking differently: Ran out of medication   [name], MD Past Month Active Self  [facility]) [id number] Yes Take 237 mLs by mouth daily. [provider] Past Week Active Self  loperamide (IMODIUM A-D) 2 MG tablet [id number] No Take 1 tablet (2 mg total) by mouth 3 (three) times daily as needed for diarrhea or loose stools.  Patient taking differently: Take 2 mg by mouth 3 (three) times daily as needed for diarrhea or loose stools. Pt ran out of medication   [name], MD More than a month Active Self  nicotine (NICODERM CQ - DOSED IN MG/24 HOURS) 14 mg/24hr patch [id number] Yes Place 1 patch (14 mg total) onto the skin daily. [name], MD Past Month Active Self  nitroGLYCERIN (NITROSTAT) 0.4 MG SL tablet [id number] No DISSOLVE 1 TABLET UNDER THE TONGUE EVERY 5 MINUTES AS NEEDED FOR CHEST PAIN. DO NOT EXCEED A TOTAL OF 3 DOSES IN 15 MINUTES. [name], MD More than a month Active Self  ondansetron (ZOFRAN) 4 MG tablet [id number] Yes Take 1 tablet (4 mg total) by mouth every 8 (eight) hours as needed for nausea or vomiting.  Patient taking differently: Take 4 mg by  mouth every 8 (eight) hours as needed for nausea or vomiting. Pt ran out of medication   [name], MD Past Month Active Self  polyvinyl alcohol (LIQUIFILM TEARS) 1.4 % ophthalmic solution [id number] Yes Place 1 drop into both eyes daily as needed for dry eyes. Pt ran out of medication [provider] Past Week Active Self  tamsulosin (FLOMAX) 0.4 MG CAPS capsule [id number] Yes TAKE ONE CAPSULE BY MOUTH ONCE DAILY  Patient taking differently: at  bedtime.   [name], MD Past Month Active Self  traMADol (ULTRAM) 50 MG tablet [id number] Yes Take 1 tablet (50 mg total) by mouth daily as needed.  Patient taking differently: Take 50 mg by mouth daily as needed. 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[place])    Diabetes mellitus without complication ([place])    Alcohol abuse    Acute gastric ulcer with hemorrhage    GI bleed 03/30/2019   Abdominal pain, left lower quadrant    Blood in the stool    Alcoholic gastritis 29/93/7169   Non-intractable vomiting 05/27/2018   Hyperkalemia 05/27/2018   Erectile dysfunction 02/09/2017   Tobacco dependence 02/09/2017   Hyperlipidemia 01/18/2017   Gastroesophageal reflux disease with esophagitis    Renal stone    Type 2 diabetes mellitus without complication, without long-term current use of insulin ([place]) 08/16/2016   Abdominal wall pain    Coronary artery disease involving native coronary artery of native heart without angina pectoris    Atypical chest pain    Essential hypertension    History of ST elevation myocardial infarction (STEMI) 07/06/2013   History of small bowel obstruction 07/29/2008   GLAUCOMA 04/15/2008    Conditions to be addressed/monitored per PCP order:   transportation  There are no care plans that you recently modified to display for this patient.   Follow up:  Patient agrees to Care Plan and Follow-up.  Plan: The Managed Medicaid care management team will reach out to the patient again over the next 30-45  days.  Date/time of next scheduled Social Work care management/care coordination outreach:  07/22/21  [name], Arita Miss, [place] Medicaid Team  [phone]

## 2021-06-21 NOTE — Patient Instructions (Signed)
Visit Information  Troy Mclaughlin was given information about Medicaid Managed Care team care coordination services as a part of their [place] Medicaid benefit. Bandon Sherwin Radi verbally consented to engagement with the [facility] Managed Care team.   If you are experiencing a medical emergency, please call 911 or report to your local emergency department or urgent care.   If you have a non-emergency medical problem during routine business hours, please contact your provider's office and ask to speak with a nurse.   For questions related to your [facility], please call: [phone] or visit the homepage here: [email or web address]  If you would like to schedule transportation through your [facility], please call the following number at least 2 days in advance of your appointment: [phone].   Call the [place] at [phone], at any time, 24 hours a day, 7 days a week. If you are in danger or need immediate medical attention call 911.  If you would like help to quit smoking, call 1-800-QUIT-NOW 743-579-4696) OR Espaol: 1-855-Djelo-Ya (8-978-478-4128) o para ms informacin haga clic aqu or Text READY to 200-400 to register via text  Mr. Dollar - following are the goals we discussed in your visit today:   Goals Addressed   None     Social Worker will follow up in 30-45 days .   [name] [name], BSW, [place] Managed Medicaid Team  [phone]   Following is a copy of your plan of care:  There are no care plans that you recently modified to display for this patient.

## 2021-06-22 ENCOUNTER — Ambulatory Visit
Admission: RE | Admit: 2021-06-22 | Discharge: 2021-06-22 | Disposition: A | Discharge status: Home / Self Care | Payer: Medicaid Other | Source: Ambulatory Visit | Attending: Radiation Oncology | Admitting: Radiation Oncology

## 2021-06-22 DIAGNOSIS — C61 Malignant neoplasm of prostate: Secondary | ICD-10-CM | POA: Diagnosis present

## 2021-06-22 NOTE — Progress Notes (Signed)
RN met with patient after CT [place] to assess navigation needs.    Transportation has been coordinated, and went well for patient this morning.  RN informed patient we will coordinated this for upcoming radiation appointments as well.  Verbalized understanding.   Troy Mclaughlin notified about upcoming radiation appointments/transportation.  Pt also verbalized he could benefit from any financial platforms that are available.  I explained to patient that referral has been placed now that he is under active treatment, and they will be contacting him to review, he verbalized understanding.

## 2021-06-29 DIAGNOSIS — C61 Malignant neoplasm of prostate: Secondary | ICD-10-CM | POA: Diagnosis not present

## 2021-07-01 ENCOUNTER — Other Ambulatory Visit: Payer: Self-pay

## 2021-07-01 ENCOUNTER — Ambulatory Visit
Admission: RE | Admit: 2021-07-01 | Discharge: 2021-07-01 | Disposition: A | Discharge status: Home / Self Care | Payer: Medicaid Other | Source: Ambulatory Visit | Attending: Radiation Oncology | Admitting: Radiation Oncology

## 2021-07-01 DIAGNOSIS — C61 Malignant neoplasm of prostate: Secondary | ICD-10-CM | POA: Diagnosis not present

## 2021-07-01 DIAGNOSIS — Z510 Encounter for antineoplastic radiation therapy: Secondary | ICD-10-CM | POA: Diagnosis not present

## 2021-07-01 DIAGNOSIS — Z191 Hormone sensitive malignancy status: Secondary | ICD-10-CM | POA: Diagnosis not present

## 2021-07-02 ENCOUNTER — Ambulatory Visit
Admission: RE | Admit: 2021-07-02 | Discharge: 2021-07-02 | Disposition: A | Discharge status: Home / Self Care | Payer: Medicaid Other | Source: Ambulatory Visit | Attending: Radiation Oncology | Admitting: Radiation Oncology

## 2021-07-02 ENCOUNTER — Ambulatory Visit: Payer: Medicaid Other

## 2021-07-02 DIAGNOSIS — C61 Malignant neoplasm of prostate: Secondary | ICD-10-CM | POA: Diagnosis not present

## 2021-07-02 NOTE — Progress Notes (Signed)
Pt here for patient teaching.    Pt given Radiation and You booklet and skin care instructions.    Reviewed areas of pertinence such as diarrhea, fatigue, hair loss, nausea and vomiting, sexual and fertility changes, skin changes, and urinary and bladder changes .   Pt able to give teach back of to pat skin, use unscented/gentle soap, have Imodium on hand, and drink plenty of water,avoid applying anything to skin within 4 hours of treatment.   Pt verbalizes understanding of information given and will contact nursing with any questions or concerns.

## 2021-07-05 ENCOUNTER — Other Ambulatory Visit: Payer: Self-pay

## 2021-07-05 ENCOUNTER — Ambulatory Visit
Admission: RE | Admit: 2021-07-05 | Discharge: 2021-07-05 | Disposition: A | Discharge status: Home / Self Care | Payer: Medicaid Other | Source: Ambulatory Visit | Attending: Radiation Oncology | Admitting: Radiation Oncology

## 2021-07-05 DIAGNOSIS — C61 Malignant neoplasm of prostate: Secondary | ICD-10-CM | POA: Diagnosis not present

## 2021-07-06 ENCOUNTER — Ambulatory Visit
Admission: RE | Admit: 2021-07-06 | Discharge: 2021-07-06 | Disposition: A | Discharge status: Home / Self Care | Payer: Medicaid Other | Source: Ambulatory Visit | Attending: Radiation Oncology | Admitting: Radiation Oncology

## 2021-07-06 ENCOUNTER — Encounter: Payer: Self-pay | Admitting: Licensed Clinical Social Worker

## 2021-07-06 DIAGNOSIS — C61 Malignant neoplasm of prostate: Secondary | ICD-10-CM | POA: Diagnosis not present

## 2021-07-06 NOTE — Progress Notes (Signed)
[place] CSW Progress Note  Clinical Education officer, museum received TC from patient asking for assistance with a light bill. CSW discussed potential resources (DSS, [employer]) and printed instructions on how to apply for those and [employer].  CSW also sent message to financial advocates to contact pt re: Advertising account executive.  With pt's permission, CSW also left VM for pt's daughter as he may need assistance going to DSS to apply for [facility].    Christeen Douglas , LCSW

## 2021-07-07 ENCOUNTER — Ambulatory Visit
Admission: RE | Admit: 2021-07-07 | Discharge: 2021-07-07 | Disposition: A | Discharge status: Home / Self Care | Payer: Medicaid Other | Source: Ambulatory Visit | Attending: Radiation Oncology | Admitting: Radiation Oncology

## 2021-07-07 ENCOUNTER — Other Ambulatory Visit: Payer: Self-pay

## 2021-07-07 DIAGNOSIS — C61 Malignant neoplasm of prostate: Secondary | ICD-10-CM | POA: Diagnosis not present

## 2021-07-07 DIAGNOSIS — Z191 Hormone sensitive malignancy status: Secondary | ICD-10-CM | POA: Diagnosis not present

## 2021-07-07 DIAGNOSIS — Z510 Encounter for antineoplastic radiation therapy: Secondary | ICD-10-CM | POA: Diagnosis not present

## 2021-07-08 ENCOUNTER — Ambulatory Visit
Admission: RE | Admit: 2021-07-08 | Discharge: 2021-07-08 | Disposition: A | Discharge status: Home / Self Care | Payer: Medicaid Other | Source: Ambulatory Visit | Attending: Radiation Oncology | Admitting: Radiation Oncology

## 2021-07-08 DIAGNOSIS — C61 Malignant neoplasm of prostate: Secondary | ICD-10-CM | POA: Diagnosis not present

## 2021-07-08 NOTE — Patient Outreach (Signed)
Patient states he has no barriers related to medication. He does not need any additional follow-up from pharmacy.

## 2021-07-09 ENCOUNTER — Other Ambulatory Visit: Payer: Self-pay

## 2021-07-09 ENCOUNTER — Ambulatory Visit
Admission: RE | Admit: 2021-07-09 | Discharge: 2021-07-09 | Disposition: A | Discharge status: Home / Self Care | Payer: Medicaid Other | Source: Ambulatory Visit | Attending: Radiation Oncology | Admitting: Radiation Oncology

## 2021-07-09 DIAGNOSIS — C61 Malignant neoplasm of prostate: Secondary | ICD-10-CM | POA: Diagnosis not present

## 2021-07-09 NOTE — Progress Notes (Signed)
RN met with patient face to face during radiation to re-assess navigation needs.  Pt verbalized transportation is going great.  Verbalized he could use assistance with any food pantry options, transportation assistance that could be used for personal needs outside of his treatment, and any assistance financially.   RN provided contact information to our financial advocates, as they have previously attempted to reach patient.  Will reach out to advocates as well to see if they are able to schedule for a face to face.  Social work notified as well to inquire about food items, and additional transportation resources.  Notified patient I would meet back with him face to face at upcoming radiation treatments to re-assess needs.  Verbalizing understanding and thankfulness.

## 2021-07-12 ENCOUNTER — Other Ambulatory Visit: Payer: Self-pay

## 2021-07-12 ENCOUNTER — Ambulatory Visit
Admission: RE | Admit: 2021-07-12 | Discharge: 2021-07-12 | Disposition: A | Discharge status: Home / Self Care | Payer: Medicaid Other | Source: Ambulatory Visit | Attending: Radiation Oncology | Admitting: Radiation Oncology

## 2021-07-12 DIAGNOSIS — C61 Malignant neoplasm of prostate: Secondary | ICD-10-CM | POA: Diagnosis not present

## 2021-07-13 ENCOUNTER — Inpatient Hospital Stay: Payer: Medicaid Other | Attending: Radiation Oncology

## 2021-07-13 ENCOUNTER — Ambulatory Visit
Admission: RE | Admit: 2021-07-13 | Discharge: 2021-07-13 | Disposition: A | Discharge status: Home / Self Care | Payer: Medicaid Other | Source: Ambulatory Visit | Attending: Radiation Oncology | Admitting: Radiation Oncology

## 2021-07-13 DIAGNOSIS — C61 Malignant neoplasm of prostate: Secondary | ICD-10-CM | POA: Diagnosis not present

## 2021-07-14 ENCOUNTER — Other Ambulatory Visit: Payer: Self-pay

## 2021-07-14 ENCOUNTER — Inpatient Hospital Stay: Payer: Medicaid Other

## 2021-07-14 ENCOUNTER — Ambulatory Visit
Admission: RE | Admit: 2021-07-14 | Discharge: 2021-07-14 | Disposition: A | Discharge status: Home / Self Care | Payer: Medicaid Other | Source: Ambulatory Visit | Attending: Radiation Oncology | Admitting: Radiation Oncology

## 2021-07-14 DIAGNOSIS — Z191 Hormone sensitive malignancy status: Secondary | ICD-10-CM | POA: Diagnosis not present

## 2021-07-14 DIAGNOSIS — C61 Malignant neoplasm of prostate: Secondary | ICD-10-CM | POA: Diagnosis not present

## 2021-07-14 DIAGNOSIS — Z510 Encounter for antineoplastic radiation therapy: Secondary | ICD-10-CM | POA: Diagnosis not present

## 2021-07-15 ENCOUNTER — Inpatient Hospital Stay: Payer: Medicaid Other

## 2021-07-15 ENCOUNTER — Encounter: Payer: Self-pay | Admitting: Licensed Clinical Social Worker

## 2021-07-15 ENCOUNTER — Ambulatory Visit
Admission: RE | Admit: 2021-07-15 | Discharge: 2021-07-15 | Disposition: A | Discharge status: Home / Self Care | Payer: Medicaid Other | Source: Ambulatory Visit | Attending: Radiation Oncology | Admitting: Radiation Oncology

## 2021-07-15 DIAGNOSIS — C61 Malignant neoplasm of prostate: Secondary | ICD-10-CM | POA: Diagnosis not present

## 2021-07-15 NOTE — Progress Notes (Signed)
Met with patient to follow up with financial needs and obtained information needed for [employer]   RN coordinated for patient to meet with social work today, and also patient provided forms to show income that will be provided to our Mining engineer.   Pt verbalized thankfulness, and RN will ensure paperwork is received to advocates and follow up on.

## 2021-07-15 NOTE — Progress Notes (Signed)
[place] CSW Progress Note  Holiday representative met with patient after radiation tx.  Completed and submitted application for [employer] [place]. Provided list of food pantries and a bag of food from [employer].  Signed up for and gave first [place] card.  Discussed housing. Pt currently living with his wife. He has limited social security income (less than $400/month). Is interested in income-based or Section 8 housing but is not on any waiting lists. CSW gave information on [employer] in addition to information he has already received on [employer] to assist in finding more affordable housing.    Christeen Douglas , LCSW

## 2021-07-16 ENCOUNTER — Ambulatory Visit
Admission: RE | Admit: 2021-07-16 | Discharge: 2021-07-16 | Disposition: A | Discharge status: Home / Self Care | Payer: Medicaid Other | Source: Ambulatory Visit | Attending: Radiation Oncology | Admitting: Radiation Oncology

## 2021-07-16 ENCOUNTER — Inpatient Hospital Stay: Payer: Medicaid Other

## 2021-07-16 ENCOUNTER — Other Ambulatory Visit: Payer: Self-pay

## 2021-07-16 DIAGNOSIS — C61 Malignant neoplasm of prostate: Secondary | ICD-10-CM | POA: Diagnosis not present

## 2021-07-19 ENCOUNTER — Ambulatory Visit
Admission: RE | Admit: 2021-07-19 | Discharge: 2021-07-19 | Disposition: A | Discharge status: Home / Self Care | Payer: Medicaid Other | Source: Ambulatory Visit | Attending: Radiation Oncology | Admitting: Radiation Oncology

## 2021-07-19 ENCOUNTER — Inpatient Hospital Stay: Payer: Medicaid Other

## 2021-07-19 ENCOUNTER — Other Ambulatory Visit: Payer: Self-pay

## 2021-07-19 DIAGNOSIS — C61 Malignant neoplasm of prostate: Secondary | ICD-10-CM | POA: Diagnosis not present

## 2021-07-20 ENCOUNTER — Inpatient Hospital Stay: Payer: Medicaid Other

## 2021-07-20 ENCOUNTER — Inpatient Hospital Stay: Payer: Medicaid Other | Admitting: Dietician

## 2021-07-20 ENCOUNTER — Ambulatory Visit
Admission: RE | Admit: 2021-07-20 | Discharge: 2021-07-20 | Disposition: A | Discharge status: Home / Self Care | Payer: Medicaid Other | Source: Ambulatory Visit | Attending: Radiation Oncology | Admitting: Radiation Oncology

## 2021-07-20 DIAGNOSIS — C61 Malignant neoplasm of prostate: Secondary | ICD-10-CM | POA: Diagnosis not present

## 2021-07-20 NOTE — Progress Notes (Signed)
Patient did not show for scheduled nutrition appointment.  

## 2021-07-21 ENCOUNTER — Inpatient Hospital Stay: Payer: Medicaid Other | Attending: Radiation Oncology

## 2021-07-21 ENCOUNTER — Other Ambulatory Visit: Payer: Self-pay

## 2021-07-21 ENCOUNTER — Ambulatory Visit
Admission: RE | Admit: 2021-07-21 | Discharge: 2021-07-21 | Disposition: A | Discharge status: Home / Self Care | Payer: Medicaid Other | Source: Ambulatory Visit | Attending: Radiation Oncology | Admitting: Radiation Oncology

## 2021-07-21 DIAGNOSIS — C61 Malignant neoplasm of prostate: Secondary | ICD-10-CM | POA: Insufficient documentation

## 2021-07-21 DIAGNOSIS — Z510 Encounter for antineoplastic radiation therapy: Secondary | ICD-10-CM | POA: Diagnosis not present

## 2021-07-21 DIAGNOSIS — Z191 Hormone sensitive malignancy status: Secondary | ICD-10-CM | POA: Diagnosis not present

## 2021-07-22 ENCOUNTER — Inpatient Hospital Stay: Payer: Medicaid Other

## 2021-07-22 ENCOUNTER — Ambulatory Visit
Admission: RE | Admit: 2021-07-22 | Discharge: 2021-07-22 | Disposition: A | Discharge status: Home / Self Care | Payer: Medicaid Other | Source: Ambulatory Visit | Attending: Radiation Oncology | Admitting: Radiation Oncology

## 2021-07-22 DIAGNOSIS — Z510 Encounter for antineoplastic radiation therapy: Secondary | ICD-10-CM | POA: Diagnosis not present

## 2021-07-22 DIAGNOSIS — C61 Malignant neoplasm of prostate: Secondary | ICD-10-CM | POA: Diagnosis not present

## 2021-07-22 DIAGNOSIS — Z191 Hormone sensitive malignancy status: Secondary | ICD-10-CM | POA: Diagnosis not present

## 2021-07-23 ENCOUNTER — Inpatient Hospital Stay: Payer: Medicaid Other

## 2021-07-23 ENCOUNTER — Ambulatory Visit
Admission: RE | Admit: 2021-07-23 | Discharge: 2021-07-23 | Disposition: A | Discharge status: Home / Self Care | Payer: Medicaid Other | Source: Ambulatory Visit | Attending: Radiation Oncology | Admitting: Radiation Oncology

## 2021-07-23 ENCOUNTER — Other Ambulatory Visit: Payer: Self-pay

## 2021-07-23 DIAGNOSIS — C61 Malignant neoplasm of prostate: Secondary | ICD-10-CM | POA: Diagnosis not present

## 2021-07-23 DIAGNOSIS — Z191 Hormone sensitive malignancy status: Secondary | ICD-10-CM | POA: Diagnosis not present

## 2021-07-23 DIAGNOSIS — Z510 Encounter for antineoplastic radiation therapy: Secondary | ICD-10-CM | POA: Diagnosis not present

## 2021-07-23 NOTE — Patient Outreach (Signed)
Care Coordination ? ?07/23/2021 ? ?[name] ?1955/11/14 ?[id number] ? ? ?Medicaid Managed Care  ? ?Unsuccessful Outreach Note ? ?07/23/2021 ?Name: Troy Mclaughlin MRN: [id number] DOB: Apr 26, 1956 ? ?Referred by: [name], MD ?Reason for referral : High Risk Managed Medicaid (MM Social Work [employer]) ? ? ?An unsuccessful telephone outreach was attempted today. The patient was referred to the case management team for assistance with care management and care coordination.  ? ?Follow Up Plan: The patient has been provided with contact information for the care management team and has been advised to call with any health related questions or concerns.  ? ?[name], BSW, MHA ?[place]  ?High Risk Managed Medicaid Team  ?[phone]  ?

## 2021-07-23 NOTE — Patient Instructions (Signed)
Visit Information ? ?Mr. Kathalene Frames Kinsel  - as a part of your Medicaid benefit, you are eligible for care management and care coordination services at no cost or copay. I was unable to reach you by phone today but would be happy to help you with your health related needs. Please feel free to call me @ [phone]  ? ? ?[name], BSW, [place] ?[place]  ?High Risk Managed Medicaid Team  ?[phone]  ?

## 2021-07-26 ENCOUNTER — Inpatient Hospital Stay: Payer: Medicaid Other

## 2021-07-26 ENCOUNTER — Other Ambulatory Visit: Payer: Self-pay

## 2021-07-26 ENCOUNTER — Ambulatory Visit
Admission: RE | Admit: 2021-07-26 | Discharge: 2021-07-26 | Disposition: A | Discharge status: Home / Self Care | Payer: Medicaid Other | Source: Ambulatory Visit | Attending: Radiation Oncology | Admitting: Radiation Oncology

## 2021-07-26 DIAGNOSIS — Z191 Hormone sensitive malignancy status: Secondary | ICD-10-CM | POA: Diagnosis not present

## 2021-07-26 DIAGNOSIS — C61 Malignant neoplasm of prostate: Secondary | ICD-10-CM | POA: Diagnosis not present

## 2021-07-26 DIAGNOSIS — Z510 Encounter for antineoplastic radiation therapy: Secondary | ICD-10-CM | POA: Diagnosis not present

## 2021-07-27 ENCOUNTER — Ambulatory Visit
Admission: RE | Admit: 2021-07-27 | Discharge: 2021-07-27 | Disposition: A | Discharge status: Home / Self Care | Payer: Medicaid Other | Source: Ambulatory Visit | Attending: Radiation Oncology | Admitting: Radiation Oncology

## 2021-07-27 ENCOUNTER — Inpatient Hospital Stay: Payer: Medicaid Other

## 2021-07-27 DIAGNOSIS — Z191 Hormone sensitive malignancy status: Secondary | ICD-10-CM | POA: Diagnosis not present

## 2021-07-27 DIAGNOSIS — Z510 Encounter for antineoplastic radiation therapy: Secondary | ICD-10-CM | POA: Diagnosis not present

## 2021-07-27 DIAGNOSIS — C61 Malignant neoplasm of prostate: Secondary | ICD-10-CM | POA: Diagnosis not present

## 2021-07-28 ENCOUNTER — Other Ambulatory Visit: Payer: Self-pay | Admitting: Urology

## 2021-07-28 ENCOUNTER — Inpatient Hospital Stay: Payer: Medicaid Other

## 2021-07-28 ENCOUNTER — Ambulatory Visit: Payer: Medicaid Other

## 2021-07-28 ENCOUNTER — Other Ambulatory Visit (HOSPITAL_COMMUNITY): Payer: Self-pay

## 2021-07-28 ENCOUNTER — Telehealth: Payer: Self-pay | Admitting: *Deleted

## 2021-07-28 MED ORDER — LOPERAMIDE HCL 2 MG PO TABS
2.0000 mg | ORAL_TABLET | Freq: Three times a day (TID) | ORAL | 1 refills | Status: DC | PRN
  Filled 2021-07-28: qty 30, 10d supply, fill #0

## 2021-07-28 MED ORDER — PHENAZOPYRIDINE HCL 200 MG PO TABS
200.0000 mg | ORAL_TABLET | Freq: Three times a day (TID) | ORAL | 1 refills | Status: DC | PRN
  Filled 2021-07-28: qty 30, 10d supply, fill #0

## 2021-07-28 NOTE — Telephone Encounter (Signed)
Returned call to the patient regarding after hours message we received.  He reports he was not able to come today for his treatment due to having some diarrhea and burning sensation with urination.  He was advised to take some AZO and imodium but has not started them.  He is asking for the medication to be sent to [facility].  I let him know I will have the physician send in these medications and he will be able to pick them up tomorrow when he is here for his radiation treatment.  He verbalized understanding.   ? ?Gloriajean Dell. Leonie Green, BSN  ?

## 2021-07-29 ENCOUNTER — Inpatient Hospital Stay: Payer: Medicaid Other

## 2021-07-29 ENCOUNTER — Ambulatory Visit
Admission: RE | Admit: 2021-07-29 | Discharge: 2021-07-29 | Disposition: A | Discharge status: Home / Self Care | Payer: Medicaid Other | Source: Ambulatory Visit | Attending: Radiation Oncology | Admitting: Radiation Oncology

## 2021-07-29 ENCOUNTER — Telehealth: Payer: Self-pay

## 2021-07-29 ENCOUNTER — Other Ambulatory Visit: Payer: Self-pay

## 2021-07-29 ENCOUNTER — Other Ambulatory Visit (HOSPITAL_COMMUNITY): Payer: Self-pay

## 2021-07-29 DIAGNOSIS — Z510 Encounter for antineoplastic radiation therapy: Secondary | ICD-10-CM | POA: Diagnosis not present

## 2021-07-29 DIAGNOSIS — C61 Malignant neoplasm of prostate: Secondary | ICD-10-CM | POA: Diagnosis not present

## 2021-07-29 DIAGNOSIS — Z191 Hormone sensitive malignancy status: Secondary | ICD-10-CM | POA: Diagnosis not present

## 2021-07-29 NOTE — Telephone Encounter (Signed)
Rn to return call about medication refill explained to the patient that he will need to contact his Urologist for that specific medication.  He expressed understanding and will be seeing his urologist soon to discuss medication need. ?

## 2021-07-30 ENCOUNTER — Inpatient Hospital Stay: Payer: Medicaid Other

## 2021-07-30 ENCOUNTER — Ambulatory Visit
Admission: RE | Admit: 2021-07-30 | Discharge: 2021-07-30 | Disposition: A | Discharge status: Home / Self Care | Payer: Medicaid Other | Source: Ambulatory Visit | Attending: Radiation Oncology | Admitting: Radiation Oncology

## 2021-07-30 DIAGNOSIS — C61 Malignant neoplasm of prostate: Secondary | ICD-10-CM | POA: Diagnosis not present

## 2021-07-30 DIAGNOSIS — Z191 Hormone sensitive malignancy status: Secondary | ICD-10-CM | POA: Diagnosis not present

## 2021-07-30 DIAGNOSIS — Z510 Encounter for antineoplastic radiation therapy: Secondary | ICD-10-CM | POA: Diagnosis not present

## 2021-07-31 ENCOUNTER — Ambulatory Visit: Payer: Medicaid Other

## 2021-08-02 ENCOUNTER — Other Ambulatory Visit: Payer: Self-pay | Admitting: Internal Medicine

## 2021-08-02 ENCOUNTER — Ambulatory Visit
Admission: RE | Admit: 2021-08-02 | Discharge: 2021-08-02 | Disposition: A | Discharge status: Home / Self Care | Payer: Medicaid Other | Source: Ambulatory Visit | Attending: Radiation Oncology | Admitting: Radiation Oncology

## 2021-08-02 ENCOUNTER — Inpatient Hospital Stay: Payer: Medicaid Other

## 2021-08-02 ENCOUNTER — Other Ambulatory Visit: Payer: Self-pay

## 2021-08-02 DIAGNOSIS — Z510 Encounter for antineoplastic radiation therapy: Secondary | ICD-10-CM | POA: Diagnosis not present

## 2021-08-02 DIAGNOSIS — C61 Malignant neoplasm of prostate: Secondary | ICD-10-CM | POA: Diagnosis not present

## 2021-08-02 DIAGNOSIS — Z191 Hormone sensitive malignancy status: Secondary | ICD-10-CM | POA: Diagnosis not present

## 2021-08-02 NOTE — Telephone Encounter (Signed)
Requested medication (s) are due for refill today: yes ? ?Requested medication (s) are on the active medication list: yes ? ?Last refill:  03/01/21 #30/3 ? ?Future visit scheduled: no ? ?Notes to clinic:  Unable to refill per protocol due to failed labs, no updated results. ? ? ? ?  ?Requested Prescriptions  ?Pending Prescriptions Disp Refills  ? tamsulosin (FLOMAX) 0.4 MG CAPS capsule [Pharmacy Med Name: tamsulosin 0.4 mg capsule] 30 capsule 3  ?  Sig: TAKE ONE CAPSULE BY MOUTH ONCE DAILY  ?  ? Urology: Alpha-Adrenergic Blocker Failed - 08/02/2021  8:03 AM  ?  ?  Failed - PSA in normal range and within 360 days  ?  PSA  ?Date Value Ref Range Status  ?03/15/2016 3.4 <=4.0 ng/mL Final  ?  Comment:  ?    ?The total PSA value from this assay system is standardized against the ?WHO standard. The test result will be approximately 20% lower when ?compared to the equimolar-standardized total PSA (Beckman Coulter). ?Comparison of serial PSA results should be interpreted with this fact ?in mind. ?  ?This test was performed using the Siemens chemiluminescent method. ?Values obtained from different assay methods cannot be used ?interchangeably. PSA levels, regardless of value, should not be ?interpreted as absolute evidence of the presence or absence of ?disease. ?  ?Effective December 28, 2015, Total PSA is being tested on the Siemens ?Centaur XP using chemiluminescence methodology. Re-baseline testing ?will be available until March 28, 2016 at no charge. If you have a ?patient that may require re-baselining, please order [id number] in ?addition to [id number]. ?  ? ?Prostate Specific Ag, Serum  ?Date Value Ref Range Status  ?04/10/2020 115.0 (H) 0.0 - 4.0 ng/mL Final  ?  Comment:  ?  Results confirmed on ?dilution. ?Roche ECLIA methodology. ?According to the American Urological Association, Serum PSA should ?decrease and remain at undetectable levels after radical ?prostatectomy. The AUA defines biochemical recurrence as an  initial ?PSA value 0.2 ng/mL or greater followed by a subsequent confirmatory ?PSA value 0.2 ng/mL or greater. ?Values obtained with different assay methods or kits cannot be used ?interchangeably. Results cannot be interpreted as absolute evidence ?of the presence or absence of malignant disease. ?  ?  ?  ?  ?  Failed - Last BP in normal range  ?  BP Readings from Last 1 Encounters:  ?06/17/21 (!) 189/106  ?  ?  ?  ?  Passed - Valid encounter within last 12 months  ?  Recent Outpatient Visits   ? ?      ? 5 months ago Nausea and vomiting, unspecified vomiting type  ? [place] [name], MD  ? 1 year ago Essential hypertension  ? [place] [name], MD  ? 1 year ago Type 2 diabetes mellitus without complication, without long-term current use of insulin ([place])  ? [place] Karle Plumber B, MD  ? 2 years ago Constipation, unspecified constipation type  ? [place], [place], [place]  ? 2 years ago Constipation, unspecified constipation type  ? [place]  ? ?  ?  ? ?  ?  ?  ? ?

## 2021-08-03 ENCOUNTER — Inpatient Hospital Stay: Payer: Medicaid Other

## 2021-08-03 ENCOUNTER — Ambulatory Visit: Payer: Medicaid Other

## 2021-08-04 ENCOUNTER — Inpatient Hospital Stay: Payer: Medicaid Other

## 2021-08-04 ENCOUNTER — Ambulatory Visit
Admission: RE | Admit: 2021-08-04 | Discharge: 2021-08-04 | Disposition: A | Discharge status: Home / Self Care | Payer: Medicaid Other | Source: Ambulatory Visit | Attending: Radiation Oncology | Admitting: Radiation Oncology

## 2021-08-04 DIAGNOSIS — Z510 Encounter for antineoplastic radiation therapy: Secondary | ICD-10-CM | POA: Diagnosis not present

## 2021-08-04 DIAGNOSIS — C61 Malignant neoplasm of prostate: Secondary | ICD-10-CM | POA: Diagnosis not present

## 2021-08-04 DIAGNOSIS — Z191 Hormone sensitive malignancy status: Secondary | ICD-10-CM | POA: Diagnosis not present

## 2021-08-05 ENCOUNTER — Ambulatory Visit: Payer: Medicaid Other

## 2021-08-05 ENCOUNTER — Ambulatory Visit
Admission: RE | Admit: 2021-08-05 | Discharge: 2021-08-05 | Disposition: A | Discharge status: Home / Self Care | Payer: Medicaid Other | Source: Ambulatory Visit | Attending: Radiation Oncology | Admitting: Radiation Oncology

## 2021-08-05 ENCOUNTER — Other Ambulatory Visit: Payer: Self-pay

## 2021-08-05 DIAGNOSIS — Z510 Encounter for antineoplastic radiation therapy: Secondary | ICD-10-CM | POA: Diagnosis not present

## 2021-08-05 DIAGNOSIS — Z191 Hormone sensitive malignancy status: Secondary | ICD-10-CM | POA: Diagnosis not present

## 2021-08-05 DIAGNOSIS — C61 Malignant neoplasm of prostate: Secondary | ICD-10-CM | POA: Diagnosis not present

## 2021-08-06 ENCOUNTER — Ambulatory Visit
Admission: RE | Admit: 2021-08-06 | Discharge: 2021-08-06 | Disposition: A | Discharge status: Home / Self Care | Payer: Medicaid Other | Source: Ambulatory Visit | Attending: Radiation Oncology | Admitting: Radiation Oncology

## 2021-08-06 ENCOUNTER — Ambulatory Visit: Payer: Medicaid Other

## 2021-08-06 ENCOUNTER — Inpatient Hospital Stay: Payer: Medicaid Other

## 2021-08-06 ENCOUNTER — Other Ambulatory Visit: Payer: Self-pay | Admitting: Radiation Oncology

## 2021-08-06 ENCOUNTER — Other Ambulatory Visit (HOSPITAL_COMMUNITY): Payer: Self-pay

## 2021-08-06 DIAGNOSIS — Z510 Encounter for antineoplastic radiation therapy: Secondary | ICD-10-CM | POA: Diagnosis not present

## 2021-08-06 DIAGNOSIS — Z191 Hormone sensitive malignancy status: Secondary | ICD-10-CM | POA: Diagnosis not present

## 2021-08-06 DIAGNOSIS — C61 Malignant neoplasm of prostate: Secondary | ICD-10-CM | POA: Diagnosis not present

## 2021-08-06 MED ORDER — SILDENAFIL CITRATE 100 MG PO TABS
100.0000 mg | ORAL_TABLET | Freq: Every day | ORAL | 0 refills | Status: DC | PRN
  Filled 2021-08-06: qty 10, 10d supply, fill #0

## 2021-08-09 ENCOUNTER — Inpatient Hospital Stay: Payer: Medicaid Other

## 2021-08-09 ENCOUNTER — Other Ambulatory Visit: Payer: Self-pay

## 2021-08-09 ENCOUNTER — Ambulatory Visit: Payer: Medicaid Other

## 2021-08-09 ENCOUNTER — Ambulatory Visit
Admission: RE | Admit: 2021-08-09 | Discharge: 2021-08-09 | Disposition: A | Discharge status: Home / Self Care | Payer: Medicaid Other | Source: Ambulatory Visit | Attending: Radiation Oncology | Admitting: Radiation Oncology

## 2021-08-09 DIAGNOSIS — Z191 Hormone sensitive malignancy status: Secondary | ICD-10-CM | POA: Diagnosis not present

## 2021-08-09 DIAGNOSIS — C61 Malignant neoplasm of prostate: Secondary | ICD-10-CM | POA: Diagnosis not present

## 2021-08-09 DIAGNOSIS — Z510 Encounter for antineoplastic radiation therapy: Secondary | ICD-10-CM | POA: Diagnosis not present

## 2021-08-10 ENCOUNTER — Inpatient Hospital Stay: Payer: Medicaid Other

## 2021-08-10 ENCOUNTER — Ambulatory Visit
Admission: RE | Admit: 2021-08-10 | Discharge: 2021-08-10 | Disposition: A | Discharge status: Home / Self Care | Payer: Medicaid Other | Source: Ambulatory Visit | Attending: Radiation Oncology | Admitting: Radiation Oncology

## 2021-08-10 DIAGNOSIS — C61 Malignant neoplasm of prostate: Secondary | ICD-10-CM | POA: Diagnosis not present

## 2021-08-10 DIAGNOSIS — Z191 Hormone sensitive malignancy status: Secondary | ICD-10-CM | POA: Diagnosis not present

## 2021-08-10 DIAGNOSIS — Z510 Encounter for antineoplastic radiation therapy: Secondary | ICD-10-CM | POA: Diagnosis not present

## 2021-08-11 ENCOUNTER — Ambulatory Visit
Admission: RE | Admit: 2021-08-11 | Discharge: 2021-08-11 | Disposition: A | Discharge status: Home / Self Care | Payer: Medicaid Other | Source: Ambulatory Visit | Attending: Radiation Oncology | Admitting: Radiation Oncology

## 2021-08-11 ENCOUNTER — Inpatient Hospital Stay: Payer: Medicaid Other

## 2021-08-11 ENCOUNTER — Other Ambulatory Visit: Payer: Self-pay

## 2021-08-11 DIAGNOSIS — Z191 Hormone sensitive malignancy status: Secondary | ICD-10-CM | POA: Diagnosis not present

## 2021-08-11 DIAGNOSIS — Z510 Encounter for antineoplastic radiation therapy: Secondary | ICD-10-CM | POA: Diagnosis not present

## 2021-08-11 DIAGNOSIS — C61 Malignant neoplasm of prostate: Secondary | ICD-10-CM | POA: Diagnosis not present

## 2021-08-12 ENCOUNTER — Ambulatory Visit
Admission: RE | Admit: 2021-08-12 | Discharge: 2021-08-12 | Disposition: A | Discharge status: Home / Self Care | Payer: Medicaid Other | Source: Ambulatory Visit | Attending: Radiation Oncology | Admitting: Radiation Oncology

## 2021-08-12 ENCOUNTER — Inpatient Hospital Stay: Payer: Medicaid Other

## 2021-08-12 DIAGNOSIS — Z510 Encounter for antineoplastic radiation therapy: Secondary | ICD-10-CM | POA: Diagnosis not present

## 2021-08-12 DIAGNOSIS — C61 Malignant neoplasm of prostate: Secondary | ICD-10-CM | POA: Diagnosis not present

## 2021-08-12 DIAGNOSIS — Z191 Hormone sensitive malignancy status: Secondary | ICD-10-CM | POA: Diagnosis not present

## 2021-08-13 ENCOUNTER — Ambulatory Visit: Payer: Medicaid Other

## 2021-08-13 ENCOUNTER — Inpatient Hospital Stay: Payer: Medicaid Other

## 2021-08-13 ENCOUNTER — Other Ambulatory Visit: Payer: Self-pay

## 2021-08-14 ENCOUNTER — Ambulatory Visit: Payer: Medicaid Other

## 2021-08-16 ENCOUNTER — Ambulatory Visit
Admission: RE | Admit: 2021-08-16 | Discharge: 2021-08-16 | Disposition: A | Discharge status: Home / Self Care | Payer: Medicaid Other | Source: Ambulatory Visit | Attending: Radiation Oncology | Admitting: Radiation Oncology

## 2021-08-16 ENCOUNTER — Other Ambulatory Visit: Payer: Self-pay

## 2021-08-16 ENCOUNTER — Inpatient Hospital Stay: Payer: Medicaid Other

## 2021-08-16 DIAGNOSIS — Z510 Encounter for antineoplastic radiation therapy: Secondary | ICD-10-CM | POA: Diagnosis not present

## 2021-08-16 DIAGNOSIS — Z191 Hormone sensitive malignancy status: Secondary | ICD-10-CM | POA: Diagnosis not present

## 2021-08-16 DIAGNOSIS — C61 Malignant neoplasm of prostate: Secondary | ICD-10-CM | POA: Diagnosis not present

## 2021-08-17 ENCOUNTER — Ambulatory Visit: Payer: Medicaid Other

## 2021-08-17 ENCOUNTER — Inpatient Hospital Stay: Payer: Medicaid Other

## 2021-08-18 ENCOUNTER — Inpatient Hospital Stay: Payer: Medicaid Other

## 2021-08-18 ENCOUNTER — Ambulatory Visit
Admission: RE | Admit: 2021-08-18 | Discharge: 2021-08-18 | Disposition: A | Discharge status: Home / Self Care | Payer: Medicaid Other | Source: Ambulatory Visit | Attending: Radiation Oncology | Admitting: Radiation Oncology

## 2021-08-18 ENCOUNTER — Other Ambulatory Visit: Payer: Self-pay

## 2021-08-18 DIAGNOSIS — Z191 Hormone sensitive malignancy status: Secondary | ICD-10-CM | POA: Diagnosis not present

## 2021-08-18 DIAGNOSIS — C61 Malignant neoplasm of prostate: Secondary | ICD-10-CM | POA: Diagnosis not present

## 2021-08-18 DIAGNOSIS — Z510 Encounter for antineoplastic radiation therapy: Secondary | ICD-10-CM | POA: Diagnosis not present

## 2021-08-19 ENCOUNTER — Ambulatory Visit
Admission: RE | Admit: 2021-08-19 | Discharge: 2021-08-19 | Disposition: A | Discharge status: Home / Self Care | Payer: Medicaid Other | Source: Ambulatory Visit | Attending: Radiation Oncology | Admitting: Radiation Oncology

## 2021-08-19 ENCOUNTER — Inpatient Hospital Stay: Payer: Medicaid Other

## 2021-08-19 DIAGNOSIS — Z191 Hormone sensitive malignancy status: Secondary | ICD-10-CM | POA: Diagnosis not present

## 2021-08-19 DIAGNOSIS — Z510 Encounter for antineoplastic radiation therapy: Secondary | ICD-10-CM | POA: Diagnosis not present

## 2021-08-19 DIAGNOSIS — C61 Malignant neoplasm of prostate: Secondary | ICD-10-CM | POA: Diagnosis not present

## 2021-08-20 ENCOUNTER — Other Ambulatory Visit: Payer: Self-pay

## 2021-08-20 ENCOUNTER — Ambulatory Visit
Admission: RE | Admit: 2021-08-20 | Discharge: 2021-08-20 | Disposition: A | Discharge status: Home / Self Care | Payer: Medicaid Other | Source: Ambulatory Visit | Attending: Radiation Oncology | Admitting: Radiation Oncology

## 2021-08-20 ENCOUNTER — Inpatient Hospital Stay: Payer: Medicaid Other

## 2021-08-20 DIAGNOSIS — C61 Malignant neoplasm of prostate: Secondary | ICD-10-CM | POA: Diagnosis not present

## 2021-08-20 DIAGNOSIS — Z191 Hormone sensitive malignancy status: Secondary | ICD-10-CM | POA: Diagnosis not present

## 2021-08-20 DIAGNOSIS — Z510 Encounter for antineoplastic radiation therapy: Secondary | ICD-10-CM | POA: Diagnosis not present

## 2021-08-23 ENCOUNTER — Ambulatory Visit: Payer: Medicaid Other

## 2021-08-23 ENCOUNTER — Inpatient Hospital Stay: Payer: Medicaid Other | Attending: Radiation Oncology

## 2021-08-23 ENCOUNTER — Other Ambulatory Visit: Payer: Self-pay

## 2021-08-23 ENCOUNTER — Ambulatory Visit
Admission: RE | Admit: 2021-08-23 | Discharge: 2021-08-23 | Disposition: A | Discharge status: Home / Self Care | Payer: Medicaid Other | Source: Ambulatory Visit | Attending: Radiation Oncology | Admitting: Radiation Oncology

## 2021-08-23 DIAGNOSIS — Z510 Encounter for antineoplastic radiation therapy: Secondary | ICD-10-CM | POA: Diagnosis not present

## 2021-08-23 DIAGNOSIS — C61 Malignant neoplasm of prostate: Secondary | ICD-10-CM | POA: Diagnosis not present

## 2021-08-23 DIAGNOSIS — Z191 Hormone sensitive malignancy status: Secondary | ICD-10-CM | POA: Diagnosis not present

## 2021-08-24 ENCOUNTER — Ambulatory Visit: Payer: Medicaid Other

## 2021-08-24 ENCOUNTER — Inpatient Hospital Stay: Payer: Medicaid Other

## 2021-08-25 ENCOUNTER — Ambulatory Visit: Payer: Medicaid Other

## 2021-08-25 ENCOUNTER — Inpatient Hospital Stay: Payer: Medicaid Other

## 2021-08-26 ENCOUNTER — Ambulatory Visit: Payer: Medicaid Other

## 2021-08-26 ENCOUNTER — Ambulatory Visit
Admission: RE | Admit: 2021-08-26 | Discharge: 2021-08-26 | Disposition: A | Discharge status: Home / Self Care | Payer: Medicaid Other | Source: Ambulatory Visit | Attending: Radiation Oncology | Admitting: Radiation Oncology

## 2021-08-26 ENCOUNTER — Other Ambulatory Visit: Payer: Self-pay

## 2021-08-26 DIAGNOSIS — C61 Malignant neoplasm of prostate: Secondary | ICD-10-CM | POA: Diagnosis not present

## 2021-08-26 DIAGNOSIS — Z191 Hormone sensitive malignancy status: Secondary | ICD-10-CM | POA: Diagnosis not present

## 2021-08-26 DIAGNOSIS — Z510 Encounter for antineoplastic radiation therapy: Secondary | ICD-10-CM | POA: Diagnosis not present

## 2021-08-27 ENCOUNTER — Ambulatory Visit: Payer: Medicaid Other

## 2021-08-28 ENCOUNTER — Ambulatory Visit: Payer: Medicaid Other

## 2021-08-30 ENCOUNTER — Ambulatory Visit: Payer: Medicaid Other

## 2021-08-31 ENCOUNTER — Ambulatory Visit: Payer: Medicaid Other

## 2021-08-31 ENCOUNTER — Inpatient Hospital Stay: Payer: Medicaid Other

## 2021-09-01 ENCOUNTER — Other Ambulatory Visit: Payer: Self-pay

## 2021-09-01 ENCOUNTER — Inpatient Hospital Stay: Payer: Medicaid Other

## 2021-09-01 ENCOUNTER — Ambulatory Visit: Payer: Medicaid Other

## 2021-09-01 ENCOUNTER — Ambulatory Visit
Admission: RE | Admit: 2021-09-01 | Discharge: 2021-09-01 | Disposition: A | Discharge status: Home / Self Care | Payer: Medicaid Other | Source: Ambulatory Visit | Attending: Radiation Oncology | Admitting: Radiation Oncology

## 2021-09-01 DIAGNOSIS — Z191 Hormone sensitive malignancy status: Secondary | ICD-10-CM | POA: Diagnosis not present

## 2021-09-01 DIAGNOSIS — C61 Malignant neoplasm of prostate: Secondary | ICD-10-CM | POA: Diagnosis not present

## 2021-09-01 DIAGNOSIS — Z510 Encounter for antineoplastic radiation therapy: Secondary | ICD-10-CM | POA: Diagnosis not present

## 2021-09-02 ENCOUNTER — Ambulatory Visit
Admission: RE | Admit: 2021-09-02 | Discharge: 2021-09-02 | Disposition: A | Discharge status: Home / Self Care | Payer: Medicaid Other | Source: Ambulatory Visit | Attending: Radiation Oncology | Admitting: Radiation Oncology

## 2021-09-02 ENCOUNTER — Inpatient Hospital Stay: Payer: Medicaid Other

## 2021-09-02 ENCOUNTER — Other Ambulatory Visit (HOSPITAL_BASED_OUTPATIENT_CLINIC_OR_DEPARTMENT_OTHER): Payer: Self-pay

## 2021-09-02 DIAGNOSIS — C61 Malignant neoplasm of prostate: Secondary | ICD-10-CM | POA: Diagnosis not present

## 2021-09-02 DIAGNOSIS — Z510 Encounter for antineoplastic radiation therapy: Secondary | ICD-10-CM | POA: Diagnosis not present

## 2021-09-02 DIAGNOSIS — Z191 Hormone sensitive malignancy status: Secondary | ICD-10-CM | POA: Diagnosis not present

## 2021-09-03 ENCOUNTER — Ambulatory Visit: Payer: Medicaid Other

## 2021-09-03 ENCOUNTER — Other Ambulatory Visit: Payer: Self-pay

## 2021-09-03 ENCOUNTER — Ambulatory Visit
Admission: RE | Admit: 2021-09-03 | Discharge: 2021-09-03 | Disposition: A | Discharge status: Home / Self Care | Payer: Medicaid Other | Source: Ambulatory Visit | Attending: Radiation Oncology | Admitting: Radiation Oncology

## 2021-09-03 ENCOUNTER — Inpatient Hospital Stay: Payer: Medicaid Other

## 2021-09-03 DIAGNOSIS — Z191 Hormone sensitive malignancy status: Secondary | ICD-10-CM | POA: Diagnosis not present

## 2021-09-03 DIAGNOSIS — C61 Malignant neoplasm of prostate: Secondary | ICD-10-CM | POA: Diagnosis not present

## 2021-09-03 DIAGNOSIS — Z510 Encounter for antineoplastic radiation therapy: Secondary | ICD-10-CM | POA: Diagnosis not present

## 2021-09-06 ENCOUNTER — Other Ambulatory Visit: Payer: Self-pay

## 2021-09-06 ENCOUNTER — Ambulatory Visit
Admission: RE | Admit: 2021-09-06 | Discharge: 2021-09-06 | Disposition: A | Discharge status: Home / Self Care | Payer: Medicaid Other | Source: Ambulatory Visit | Attending: Radiation Oncology | Admitting: Radiation Oncology

## 2021-09-06 ENCOUNTER — Ambulatory Visit: Payer: Medicaid Other

## 2021-09-06 ENCOUNTER — Inpatient Hospital Stay: Payer: Medicaid Other

## 2021-09-06 DIAGNOSIS — Z510 Encounter for antineoplastic radiation therapy: Secondary | ICD-10-CM | POA: Diagnosis not present

## 2021-09-06 DIAGNOSIS — C61 Malignant neoplasm of prostate: Secondary | ICD-10-CM | POA: Diagnosis not present

## 2021-09-06 DIAGNOSIS — Z191 Hormone sensitive malignancy status: Secondary | ICD-10-CM | POA: Diagnosis not present

## 2021-09-07 ENCOUNTER — Ambulatory Visit
Admission: RE | Admit: 2021-09-07 | Discharge: 2021-09-07 | Disposition: A | Discharge status: Home / Self Care | Payer: Medicaid Other | Source: Ambulatory Visit | Attending: Radiation Oncology | Admitting: Radiation Oncology

## 2021-09-07 ENCOUNTER — Encounter: Payer: Self-pay | Admitting: Urology

## 2021-09-07 ENCOUNTER — Other Ambulatory Visit: Payer: Self-pay | Admitting: Internal Medicine

## 2021-09-07 ENCOUNTER — Other Ambulatory Visit: Payer: Self-pay

## 2021-09-07 ENCOUNTER — Ambulatory Visit: Payer: Medicaid Other

## 2021-09-07 DIAGNOSIS — Z191 Hormone sensitive malignancy status: Secondary | ICD-10-CM | POA: Diagnosis not present

## 2021-09-07 DIAGNOSIS — Z510 Encounter for antineoplastic radiation therapy: Secondary | ICD-10-CM | POA: Diagnosis not present

## 2021-09-07 DIAGNOSIS — C61 Malignant neoplasm of prostate: Secondary | ICD-10-CM | POA: Diagnosis not present

## 2021-09-07 LAB — RAD ONC ARIA SESSION SUMMARY
Course Elapsed Days: 68
Plan Fractions Treated to Date: 15
Plan Prescribed Dose Per Fraction: 2 Gy
Plan Total Fractions Prescribed: 15
Plan Total Prescribed Dose: 30 Gy
Reference Point Dosage Given to Date: 75 Gy
Reference Point Session Dosage Given: 2 Gy
Session Number: 40

## 2021-09-21 ENCOUNTER — Other Ambulatory Visit: Payer: Self-pay

## 2021-09-21 ENCOUNTER — Emergency Department (HOSPITAL_COMMUNITY)
Admission: EM | Admit: 2021-09-21 | Discharge: 2021-09-21 | Disposition: A | Discharge status: Home / Self Care | Payer: Medicaid Other | Attending: Emergency Medicine | Admitting: Emergency Medicine

## 2021-09-21 ENCOUNTER — Encounter (HOSPITAL_COMMUNITY): Payer: Self-pay

## 2021-09-21 ENCOUNTER — Emergency Department (HOSPITAL_COMMUNITY): Payer: Medicaid Other

## 2021-09-21 DIAGNOSIS — R519 Headache, unspecified: Secondary | ICD-10-CM | POA: Diagnosis not present

## 2021-09-21 DIAGNOSIS — R197 Diarrhea, unspecified: Secondary | ICD-10-CM | POA: Diagnosis not present

## 2021-09-21 DIAGNOSIS — R638 Other symptoms and signs concerning food and fluid intake: Secondary | ICD-10-CM | POA: Insufficient documentation

## 2021-09-21 DIAGNOSIS — I700 Atherosclerosis of aorta: Secondary | ICD-10-CM | POA: Diagnosis not present

## 2021-09-21 DIAGNOSIS — L905 Scar conditions and fibrosis of skin: Secondary | ICD-10-CM | POA: Insufficient documentation

## 2021-09-21 DIAGNOSIS — R112 Nausea with vomiting, unspecified: Secondary | ICD-10-CM | POA: Insufficient documentation

## 2021-09-21 DIAGNOSIS — R300 Dysuria: Secondary | ICD-10-CM | POA: Insufficient documentation

## 2021-09-21 DIAGNOSIS — R531 Weakness: Secondary | ICD-10-CM | POA: Insufficient documentation

## 2021-09-21 DIAGNOSIS — R42 Dizziness and giddiness: Secondary | ICD-10-CM | POA: Insufficient documentation

## 2021-09-21 DIAGNOSIS — Z85038 Personal history of other malignant neoplasm of large intestine: Secondary | ICD-10-CM | POA: Insufficient documentation

## 2021-09-21 DIAGNOSIS — R1084 Generalized abdominal pain: Secondary | ICD-10-CM | POA: Diagnosis not present

## 2021-09-21 DIAGNOSIS — K769 Liver disease, unspecified: Secondary | ICD-10-CM | POA: Diagnosis not present

## 2021-09-21 LAB — URINALYSIS, ROUTINE W REFLEX MICROSCOPIC
Bacteria, UA: NONE SEEN
Bilirubin Urine: NEGATIVE
Glucose, UA: NEGATIVE mg/dL
Hgb urine dipstick: NEGATIVE
Ketones, ur: NEGATIVE mg/dL
Leukocytes,Ua: NEGATIVE
Nitrite: NEGATIVE
Protein, ur: NEGATIVE mg/dL
Specific Gravity, Urine: <=1.005 (ref 1.005–1.030)
pH: 5 (ref 5.0–8.0)

## 2021-09-21 LAB — COMPREHENSIVE METABOLIC PANEL
ALT: 17 U/L (ref 0–44)
AST: 24 U/L (ref 15–41)
Albumin: 4 g/dL (ref 3.5–5.0)
Alkaline Phosphatase: 100 U/L (ref 38–126)
Anion gap: 11 (ref 5–15)
BUN: 12 mg/dL (ref 8–23)
CO2: 25 mmol/L (ref 22–32)
Calcium: 9.9 mg/dL (ref 8.9–10.3)
Chloride: 100 mmol/L (ref 98–111)
Creatinine, Ser: 1.03 mg/dL (ref 0.61–1.24)
GFR, Estimated: >60 mL/min (ref >60)
Glucose, Bld: 127 mg/dL — ABNORMAL HIGH (ref 70–99)
Potassium: 3.7 mmol/L (ref 3.5–5.1)
Sodium: 136 mmol/L (ref 135–145)
Total Bilirubin: 0.9 mg/dL (ref 0.3–1.2)
Total Protein: 7.9 g/dL (ref 6.5–8.1)

## 2021-09-21 LAB — CBC
HCT: 41.3 % (ref 39.0–52.0)
Hemoglobin: 14.3 g/dL (ref 13.0–17.0)
MCH: 26.2 pg (ref 26.0–34.0)
MCHC: 34.6 g/dL (ref 30.0–36.0)
MCV: 75.6 fL — ABNORMAL LOW (ref 80.0–100.0)
Platelets: 226 10*3/uL (ref 150–400)
RBC: 5.46 MIL/uL (ref 4.22–5.81)
RDW: 15.9 % — ABNORMAL HIGH (ref 11.5–15.5)
WBC: 4.3 10*3/uL (ref 4.0–10.5)
nRBC: 0 % (ref 0.0–0.2)

## 2021-09-21 LAB — LIPASE, BLOOD: Lipase: 25 U/L (ref 11–51)

## 2021-09-21 MED ORDER — FENTANYL CITRATE PF 50 MCG/ML IJ SOSY
50.0000 ug | PREFILLED_SYRINGE | Freq: Once | INTRAMUSCULAR | Status: AC
  Administered 2021-09-21: 50 ug via INTRAVENOUS
  Filled 2021-09-21: qty 1

## 2021-09-21 MED ORDER — IOHEXOL 300 MG/ML  SOLN
100.0000 mL | Freq: Once | INTRAMUSCULAR | Status: AC | PRN
  Administered 2021-09-21: 100 mL via INTRAVENOUS

## 2021-09-21 MED ORDER — MECLIZINE HCL 25 MG PO TABS: 25.0000 mg | ORAL_TABLET | Freq: Three times a day (TID) | ORAL | 0 refills | Status: DC | PRN

## 2021-09-21 MED ORDER — ONDANSETRON 4 MG PO TBDP
4.0000 mg | ORAL_TABLET | Freq: Once | ORAL | Status: AC | PRN
  Administered 2021-09-21: 4 mg via ORAL
  Filled 2021-09-21: qty 1

## 2021-09-21 MED ORDER — ONDANSETRON 4 MG PO TBDP: 4.0000 mg | ORAL_TABLET | Freq: Once | ORAL | Status: DC

## 2021-09-21 MED ORDER — MECLIZINE HCL 25 MG PO TABS
25.0000 mg | ORAL_TABLET | Freq: Three times a day (TID) | ORAL | 0 refills | Status: DC | PRN
Start: 2021-09-21 — End: 2023-07-19

## 2021-09-21 MED ORDER — OXYCODONE-ACETAMINOPHEN 5-325 MG PO TABS
1.0000 | ORAL_TABLET | Freq: Once | ORAL | Status: AC
  Administered 2021-09-21: 1 via ORAL
  Filled 2021-09-21: qty 1

## 2021-09-21 MED ORDER — HYDROCODONE-ACETAMINOPHEN 5-325 MG PO TABS: 1.0000 | ORAL_TABLET | Freq: Four times a day (QID) | ORAL | 0 refills | Status: DC | PRN

## 2021-09-21 MED ORDER — SODIUM CHLORIDE 0.9 % IV BOLUS
1000.0000 mL | Freq: Once | INTRAVENOUS | Status: AC
Start: 2021-09-21 — End: 2021-09-21
  Administered 2021-09-21: 1000 mL via INTRAVENOUS

## 2021-09-21 MED ORDER — ONDANSETRON 4 MG PO TBDP
4.0000 mg | ORAL_TABLET | Freq: Once | ORAL | Status: AC
  Administered 2021-09-21: 4 mg via ORAL
  Filled 2021-09-21: qty 1

## 2021-09-21 NOTE — Discharge Instructions (Signed)

## 2021-09-21 NOTE — ED Provider Triage Note (Signed)
Emergency Medicine Provider Triage Evaluation Note ? ?Troy Mclaughlin , a 66 y.o. male  was evaluated in triage.  Pt complains of nausea, vomiting, diarrhea, headache.  Patient has a history of prostate cancer, just finished radiation therapy for same.  Over the past 2 weeks he has had nausea, vomiting after eating, watery stool 2-3 times a day.  He has noticed blood only on one occasion.  No fever, chest pain or shortness of breath.  No weakness, numbness, or tingling in arms or legs.  He has been taking medication for diarrhea without improvement.  He has burning with urination as well. ? ?Review of Systems  ?Positive: Nausea, vomiting, diarrhea ?Negative: Chest pain ? ?Physical Exam  ?BP (!) 134/105 (BP Location: Right Arm)   Pulse 98   Temp 97.6 ?F (36.4 ?C) (Oral)   Resp 16   Ht '5\' 9"'$  (1.753 m)   Wt 63.5 kg   SpO2 100%   BMI 20.67 kg/m?  ?Gen:   Awake, no distress   ?Resp:  Normal effort  ?MSK:   Moves extremities without difficulty  ?Other:  Abdomen diffusely tender ? ?Medical Decision Making  ?Medically screening exam initiated at 10:00 AM.  Appropriate orders placed.  Alison Breeding Servantes was informed that the remainder of the evaluation will be completed by another provider, this initial triage assessment does not replace that evaluation, and the importance of remaining in the ED until their evaluation is complete. ? ?  ?Carlisle Cater, PA-C ?09/21/21 1001 ? ?
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
## 2021-09-21 NOTE — ED Notes (Signed)
Patient transported to CT 

## 2021-09-21 NOTE — ED Triage Notes (Signed)
Patient states since having his last radiation treatment 2 weeks ago he has been having intermittent N/v/D and headache.  ?Patient reports a history of stage 4 prostate cancer, ?

## 2021-09-22 ENCOUNTER — Telehealth: Payer: Self-pay

## 2021-09-22 NOTE — Telephone Encounter (Signed)
Transition Care Management Unsuccessful Follow-up Telephone Call ? ?Date of discharge and from where:  09/21/2021 from [facility] ? ?Attempts:  1st Attempt ? ?Reason for unsuccessful TCM follow-up call:  Left voice message ? ? ? ?

## 2021-09-23 NOTE — Telephone Encounter (Signed)
Transition Care Management Unsuccessful Follow-up Telephone Call ? ?Date of discharge and from where:  09/21/2021 from [facility] ? ?Attempts:  2nd Attempt ? ?Reason for unsuccessful TCM follow-up call:  Left voice message ? ? ? ?

## 2021-09-24 NOTE — Telephone Encounter (Signed)
Transition Care Management Unsuccessful Follow-up Telephone Call ? ?Date of discharge and from where:  09/21/2021-[facility] ? ?Attempts:  3rd Attempt ? ?Reason for unsuccessful TCM follow-up call:  Left voice message ? ?  ?

## 2021-10-05 ENCOUNTER — Encounter: Payer: Self-pay | Admitting: Urology

## 2021-10-05 NOTE — Progress Notes (Addendum)
Telephone appointment. I verified the identity of patient's spouse Mrs. Troy Mclaughlin and began nursing interview. She reports that patient [name] is experiencing some groin pain 9/10. I advised patient to follow up w/ his urologist. Mrs. Troy Mclaughlin states "Patient canceled his urology appointment for today 10/05/21 and is going to reschedule as soon as possible." No other issues reported at this time. ? ?Meaningful use complete. ?I-PSS score of 3 (mild). ?Flomax 0.'4mg'$  as directed ?Urology appointment- May, 2023-per patient. ? ?Reminded Mrs. Troy Mclaughlin of patient's 9:00am-10/06/21 telephone appointment w/ Freeman Caldron PA-C. I left my extension [phone] in case patient needs anything. ? ?Patient preferred contact [phone] ?

## 2021-10-06 ENCOUNTER — Ambulatory Visit
Admission: RE | Admit: 2021-10-06 | Discharge: 2021-10-06 | Disposition: A | Discharge status: Home / Self Care | Payer: Medicaid Other | Source: Ambulatory Visit | Attending: Radiation Oncology | Admitting: Radiation Oncology

## 2021-10-06 DIAGNOSIS — C61 Malignant neoplasm of prostate: Secondary | ICD-10-CM | POA: Insufficient documentation

## 2021-10-06 NOTE — Progress Notes (Signed)
?Radiation Oncology         [phone]) [phone] ?________________________________ ? ?Name: Troy Mclaughlin MRN: [id number]  ?Date: 10/06/2021  DOB: [birth year] ? ?Post Treatment Note ? ?CC: [name], MD  Janith Lima, MD ? ?Diagnosis:   66 y.o. 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    ? ?Interval Since Last Radiation:  4 weeks; concurrent with LT-ADT (started 03/2021)  ?07/01/21 - 09/07/20: ?1. The prostate, seminal vesicles, and pelvic lymph nodes were initially treated to 45 Gy in 25 fractions of 1.8 Gy  ?2. The prostate only was boosted to 75 Gy with 15 additional fractions of 2.0 Gy  ? ?Narrative:  I spoke with the patient to conduct his routine scheduled 1 month follow up visit via telephone to spare the patient unnecessary potential exposure in the healthcare setting during the current COVID-19 pandemic.  The patient was notified in advance and gave permission to proceed with this visit format. ? ?He tolerated radiation treatment relatively well with only minor urinary irritation and modest fatigue.  He did report increased frequency and urgency despite taking Flomax daily as prescribed.  He also experienced some mild, intermittent groin pain, diarrhea and nausea.                             ? ?On review of systems, the patient states that he is doing well in general but does continue with significant LUTS, particularly frequency, urgency and dysuria despite taking Flomax as prescribed.  He has been on Flomax for a while and did have severe LUTS prior to starting treatment.  He also reports some continued groin pain and nausea and diarrhea.  He denies abdominal pain, gross hematuria, straining to void or incomplete emptying but does occasionally have small-volume incontinence if he does not make it to the bathroom in time.  He also continues with significant fatigue/decreased stamina and continues to have issues with ED as well as Peyronie's disease.   He had a scheduled follow-up visit with Dr. Abner Greenspan on 10/04/2021 but had to cancel due to not having transportation.  He reports that he is rescheduled to see Dr. Abner Greenspan on 10/15/2021. ? ?ALLERGIES:  is allergic to lisinopril. ? ?Meds: ?Current Outpatient Medications  ?Medication Sig Dispense Refill  ? acetaminophen (TYLENOL) 500 MG tablet Take 2 tablets (1,000 mg total) by mouth every 6 (six) hours as needed for moderate pain or mild pain. 30 tablet 0  ? amLODipine (NORVASC) 5 MG tablet TAKE ONE TABLET BY MOUTH EVERY MORNING (Patient taking differently: Ran out of medication) 90 tablet 1  ? atorvastatin (LIPITOR) 10 MG tablet TAKE ONE TABLET BY MOUTH EVERY MORNING (Patient taking differently: Take 10 mg by mouth at bedtime.) 30 tablet 0  ? carvedilol (COREG) 12.5 MG tablet TAKE 1/2 TABLET BY MOUTH TWICE DAILY WITH A MEAL (Patient taking differently: Ran out of medication) 90 tablet 1  ? Ensure (ENSURE) Take 237 mLs by mouth daily.    ? HYDROcodone-acetaminophen (NORCO) 5-325 MG tablet Take 1 tablet by mouth every 6 (six) hours as needed for severe pain. 10 tablet 0  ? loperamide (IMODIUM A-D) 2 MG tablet Take 1 tablet (2 mg total) by mouth 3 (three) times daily as needed for diarrhea or loose stools. 30 tablet 1  ? meclizine (ANTIVERT) 25 MG tablet Take 1 tablet (25 mg total) by mouth 3 (three) times daily  as needed for dizziness. 30 tablet 0  ? nicotine (NICODERM CQ - DOSED IN MG/24 HOURS) 14 mg/24hr patch Place 1 patch (14 mg total) onto the skin daily. 28 patch 0  ? nitroGLYCERIN (NITROSTAT) 0.4 MG SL tablet DISSOLVE 1 TABLET UNDER THE TONGUE EVERY 5 MINUTES AS NEEDED FOR CHEST PAIN. DO NOT EXCEED A TOTAL OF 3 DOSES IN 15 MINUTES. 25 tablet 1  ? ondansetron (ZOFRAN) 4 MG tablet Take 1 tablet (4 mg total) by mouth every 8 (eight) hours as needed for nausea or vomiting. (Patient taking differently: Take 4 mg by mouth every 8 (eight) hours as needed for nausea or vomiting. Pt ran out of medication) 15 tablet 3  ?  phenazopyridine (PYRIDIUM) 200 MG tablet Take 1 tablet by mouth 3 (three) times daily as needed for pain. 30 tablet 1  ? polyvinyl alcohol (LIQUIFILM TEARS) 1.4 % ophthalmic solution Place 1 drop into both eyes daily as needed for dry eyes. Pt ran out of medication    ? sildenafil (VIAGRA) 100 MG tablet Take 1 tablet (100 mg total) by mouth daily as needed for erectile dysfunction. 10 tablet 0  ? tamsulosin (FLOMAX) 0.4 MG CAPS capsule TAKE ONE CAPSULE BY MOUTH ONCE DAILY 30 capsule 0  ? ?No current facility-administered medications for this encounter.  ? ? ?Physical Findings: ? vitals were not taken for this visit.  ?Pain Assessment ?Pain Score: 9  (Groin pain)/10 ?Unable to assess due to telephone follow-up visit format. ? ?Lab Findings: ?Lab Results  ?Component Value Date  ? WBC 4.3 09/21/2021  ? HGB 14.3 09/21/2021  ? HCT 41.3 09/21/2021  ? MCV 75.6 (L) 09/21/2021  ? PLT 226 09/21/2021  ? ? ? ?Radiographic Findings: ?CT ABDOMEN PELVIS W CONTRAST ? ?Result Date: 09/21/2021 ?CLINICAL DATA:  History of stage IV prostate cancer, presenting with nausea, vomiting, diarrhea and headache. EXAM: CT ABDOMEN AND PELVIS WITH CONTRAST TECHNIQUE: Multidetector CT imaging of the abdomen and pelvis was performed using the standard protocol following bolus administration of intravenous contrast. RADIATION DOSE REDUCTION: This exam was performed according to the departmental dose-optimization program which includes automated exposure control, adjustment of the mA and/or kV according to patient size and/or use of iterative reconstruction technique. CONTRAST:  133m OMNIPAQUE IOHEXOL 300 MG/ML  SOLN COMPARISON:  October 27, 2020 FINDINGS: Lower chest: No acute abnormality. Hepatobiliary: A stable 0.6 cm focus of parenchymal low attenuation is seen within the left lobe of the liver. An additional, stable, well-defined, 2.8 cm x 2.2 cm low-attenuation liver lesion is seen within the posterior aspect of the right lobe. No gallstones,  gallbladder wall thickening, or biliary dilatation. Pancreas: Unremarkable. No pancreatic ductal dilatation or surrounding inflammatory changes. Spleen: Normal in size without focal abnormality. Adrenals/Urinary Tract: Adrenal glands are unremarkable. Kidneys are normal, without obstructing renal calculi, focal lesion, or hydronephrosis. A 5 mm nonobstructing renal calculus is seen within the left kidney. The urinary bladder is poorly distended and subsequently limited in evaluation. Moderate to marked severity diffuse urinary bladder wall thickening is seen. Stomach/Bowel: Multiple surgical clips are seen along the anterior aspect of the gastroesophageal junction. Stomach is within normal limits. Surgically anastomosed bowel is seen within the anterior aspect of the upper abdomen, along the midline. Appendix appears normal. No evidence of bowel wall thickening, distention, or inflammatory changes. Surgical clips are seen within the mesentery of the mid to upper left abdomen, along the expected region of the left adrenal gland and posterior to the left kidney. Vascular/Lymphatic: Aortic  atherosclerosis. No enlarged abdominal or pelvic lymph nodes. Reproductive: Gold seed implants are seen within the prostate gland. There is decreased severity of prostatomegaly when compared to the prior study. Other: No abdominal wall hernia or abnormality. No abdominopelvic ascites. Musculoskeletal: Innumerable sclerotic foci are seen scattered throughout the osseous skeleton. No acute osseous abnormalities are identified. IMPRESSION: 1. Findings consistent with diffuse osseous metastasis. 2. 5 mm nonobstructing left renal calculus. 3. Diffuse urinary bladder wall thickening which may be, in part, secondary to poor bladder distention. Sequelae associated with cystitis cannot be excluded. Correlation with urinalysis is recommended. 4. Gold seed implants within the prostate gland with decreased severity of prostatomegaly when  compared to the prior study. 5. Stable hepatic hemangiomas. 6. Aortic atherosclerosis. Aortic Atherosclerosis (ICD10-I70.0). Electronically Signed   By: Virgina Norfolk M.D.   On: 09/21/2021 18:06   ? ?Impression/Plan:

## 2021-10-06 NOTE — Progress Notes (Signed)
?  Radiation Oncology         (336) [phone] ?________________________________ ? ?Name: Troy Mclaughlin MRN: [id number]  ?Date: 09/07/2021  DOB: Oct 25, 1955 ? ?End of Treatment Note ? ?Diagnosis:   66 y.o. 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    ? ?Indication for treatment:  Curative, Definitive Radiotherapy      ? ?Radiation treatment dates:   07/01/21 - 09/07/20 ? ?Site/dose:  ?1. The prostate, seminal vesicles, and pelvic lymph nodes were initially treated to 45 Gy in 25 fractions of 1.8 Gy  ?2. The prostate only was boosted to 75 Gy with 15 additional fractions of 2.0 Gy  ? ?Beams/energy:  ?1. The prostate, seminal vesicles, and pelvic lymph nodes were initially treated using VMAT intensity modulated radiotherapy delivering 6 megavolt photons. Image guidance was performed with CB-CT studies prior to each fraction. He was immobilized with a body fix lower extremity mold.  ?2. the prostate only was boosted using VMAT intensity modulated radiotherapy delivering 6 megavolt photons. Image guidance was performed with CB-CT studies prior to each fraction. He was immobilized with a body fix lower extremity mold. ? ?Narrative: The patient tolerated radiation treatment relatively well with only minor urinary irritation and modest fatigue.  He did report increased frequency and urgency despite taking Flomax daily as prescribed.  He also experienced some mild, intermittent groin pain, diarrhea and nausea. ? ?Plan: The patient has completed radiation treatment. He will return to radiation oncology clinic for routine followup in one month. I advised him to call or return sooner if he has any questions or concerns related to his recovery or treatment. ?________________________________ ? ?Sheral Apley Tammi Klippel, M.D. ?  ?

## 2021-10-14 DIAGNOSIS — N5201 Erectile dysfunction due to arterial insufficiency: Secondary | ICD-10-CM | POA: Diagnosis not present

## 2021-10-14 DIAGNOSIS — N486 Induration penis plastica: Secondary | ICD-10-CM | POA: Diagnosis not present

## 2021-10-14 DIAGNOSIS — C61 Malignant neoplasm of prostate: Secondary | ICD-10-CM | POA: Diagnosis not present

## 2021-11-01 ENCOUNTER — Other Ambulatory Visit: Payer: Self-pay

## 2021-11-01 ENCOUNTER — Emergency Department (HOSPITAL_COMMUNITY)
Admission: EM | Admit: 2021-11-01 | Discharge: 2021-11-02 | Discharge status: Against Medical Advice | Payer: Medicaid Other | Attending: Emergency Medicine | Admitting: Emergency Medicine

## 2021-11-01 ENCOUNTER — Encounter (HOSPITAL_COMMUNITY): Payer: Self-pay | Admitting: Emergency Medicine

## 2021-11-01 DIAGNOSIS — Z5321 Procedure and treatment not carried out due to patient leaving prior to being seen by health care provider: Secondary | ICD-10-CM | POA: Insufficient documentation

## 2021-11-01 DIAGNOSIS — M7918 Myalgia, other site: Secondary | ICD-10-CM | POA: Diagnosis not present

## 2021-11-01 DIAGNOSIS — F1090 Alcohol use, unspecified, uncomplicated: Secondary | ICD-10-CM | POA: Diagnosis present

## 2021-11-01 LAB — COMPREHENSIVE METABOLIC PANEL
ALT: 16 U/L (ref 0–44)
AST: 25 U/L (ref 15–41)
Albumin: 4.2 g/dL (ref 3.5–5.0)
Alkaline Phosphatase: 113 U/L (ref 38–126)
Anion gap: 11 (ref 5–15)
BUN: 9 mg/dL (ref 8–23)
CO2: 26 mmol/L (ref 22–32)
Calcium: 9.6 mg/dL (ref 8.9–10.3)
Chloride: 104 mmol/L (ref 98–111)
Creatinine, Ser: 0.82 mg/dL (ref 0.61–1.24)
GFR, Estimated: >60 mL/min (ref >60)
Glucose, Bld: 94 mg/dL (ref 70–99)
Potassium: 4.1 mmol/L (ref 3.5–5.1)
Sodium: 141 mmol/L (ref 135–145)
Total Bilirubin: 0.4 mg/dL (ref 0.3–1.2)
Total Protein: 8.4 g/dL — ABNORMAL HIGH (ref 6.5–8.1)

## 2021-11-01 LAB — CBC
HCT: 37.9 % — ABNORMAL LOW (ref 39.0–52.0)
Hemoglobin: 12.8 g/dL — ABNORMAL LOW (ref 13.0–17.0)
MCH: 25.5 pg — ABNORMAL LOW (ref 26.0–34.0)
MCHC: 33.8 g/dL (ref 30.0–36.0)
MCV: 75.6 fL — ABNORMAL LOW (ref 80.0–100.0)
Platelets: 267 10*3/uL (ref 150–400)
RBC: 5.01 MIL/uL (ref 4.22–5.81)
RDW: 15 % (ref 11.5–15.5)
WBC: 3.5 10*3/uL — ABNORMAL LOW (ref 4.0–10.5)
nRBC: 0 % (ref 0.0–0.2)

## 2021-11-01 LAB — ETHANOL: Alcohol, Ethyl (B): 320 mg/dL (ref <10)

## 2021-11-01 NOTE — ED Notes (Signed)
Patient also complains of recent nausea, vomiting and painful urination.

## 2021-11-01 NOTE — ED Notes (Signed)
Pt walking and talking in waiting room

## 2021-11-01 NOTE — ED Triage Notes (Signed)
Patient presents with ETOH intoxication. He complains of generalized pain. He is unsure of the amount of alcohol he has drank today.    EMS vitals: 166/112 BP 82 HR 16 RR 97% SPO2 on room air 101 CBG

## 2021-11-25 ENCOUNTER — Inpatient Hospital Stay (HOSPITAL_COMMUNITY)
Admission: EM | Admit: 2021-11-25 | Discharge: 2021-11-30 | DRG: 390 | Disposition: A | Discharge status: Home / Self Care | Payer: Medicaid Other | Attending: Internal Medicine | Admitting: Internal Medicine

## 2021-11-25 ENCOUNTER — Encounter (HOSPITAL_COMMUNITY): Payer: Self-pay | Admitting: Emergency Medicine

## 2021-11-25 ENCOUNTER — Emergency Department (HOSPITAL_COMMUNITY): Payer: Medicaid Other

## 2021-11-25 DIAGNOSIS — D638 Anemia in other chronic diseases classified elsewhere: Secondary | ICD-10-CM | POA: Diagnosis not present

## 2021-11-25 DIAGNOSIS — Z888 Allergy status to other drugs, medicaments and biological substances status: Secondary | ICD-10-CM | POA: Diagnosis not present

## 2021-11-25 DIAGNOSIS — I119 Hypertensive heart disease without heart failure: Secondary | ICD-10-CM | POA: Diagnosis present

## 2021-11-25 DIAGNOSIS — R109 Unspecified abdominal pain: Secondary | ICD-10-CM | POA: Diagnosis not present

## 2021-11-25 DIAGNOSIS — I252 Old myocardial infarction: Secondary | ICD-10-CM

## 2021-11-25 DIAGNOSIS — I2510 Atherosclerotic heart disease of native coronary artery without angina pectoris: Secondary | ICD-10-CM | POA: Diagnosis not present

## 2021-11-25 DIAGNOSIS — R071 Chest pain on breathing: Secondary | ICD-10-CM | POA: Diagnosis not present

## 2021-11-25 DIAGNOSIS — Z8711 Personal history of peptic ulcer disease: Secondary | ICD-10-CM

## 2021-11-25 DIAGNOSIS — I10 Essential (primary) hypertension: Secondary | ICD-10-CM | POA: Diagnosis present

## 2021-11-25 DIAGNOSIS — R079 Chest pain, unspecified: Secondary | ICD-10-CM

## 2021-11-25 DIAGNOSIS — E785 Hyperlipidemia, unspecified: Secondary | ICD-10-CM

## 2021-11-25 DIAGNOSIS — K56609 Unspecified intestinal obstruction, unspecified as to partial versus complete obstruction: Secondary | ICD-10-CM | POA: Diagnosis not present

## 2021-11-25 DIAGNOSIS — F17210 Nicotine dependence, cigarettes, uncomplicated: Secondary | ICD-10-CM | POA: Diagnosis present

## 2021-11-25 DIAGNOSIS — K219 Gastro-esophageal reflux disease without esophagitis: Secondary | ICD-10-CM | POA: Diagnosis present

## 2021-11-25 DIAGNOSIS — R112 Nausea with vomiting, unspecified: Secondary | ICD-10-CM | POA: Diagnosis not present

## 2021-11-25 DIAGNOSIS — Z79899 Other long term (current) drug therapy: Secondary | ICD-10-CM

## 2021-11-25 DIAGNOSIS — E119 Type 2 diabetes mellitus without complications: Secondary | ICD-10-CM | POA: Diagnosis not present

## 2021-11-25 DIAGNOSIS — R000 Tachycardia, unspecified: Secondary | ICD-10-CM | POA: Diagnosis not present

## 2021-11-25 DIAGNOSIS — R1111 Vomiting without nausea: Secondary | ICD-10-CM | POA: Diagnosis not present

## 2021-11-25 DIAGNOSIS — E1169 Type 2 diabetes mellitus with other specified complication: Secondary | ICD-10-CM

## 2021-11-25 DIAGNOSIS — N132 Hydronephrosis with renal and ureteral calculous obstruction: Secondary | ICD-10-CM | POA: Diagnosis not present

## 2021-11-25 DIAGNOSIS — N400 Enlarged prostate without lower urinary tract symptoms: Secondary | ICD-10-CM | POA: Diagnosis not present

## 2021-11-25 DIAGNOSIS — R1110 Vomiting, unspecified: Secondary | ICD-10-CM | POA: Diagnosis not present

## 2021-11-25 DIAGNOSIS — E876 Hypokalemia: Secondary | ICD-10-CM | POA: Diagnosis not present

## 2021-11-25 DIAGNOSIS — C61 Malignant neoplasm of prostate: Secondary | ICD-10-CM | POA: Diagnosis present

## 2021-11-25 DIAGNOSIS — Z800 Family history of malignant neoplasm of digestive organs: Secondary | ICD-10-CM | POA: Diagnosis not present

## 2021-11-25 DIAGNOSIS — R110 Nausea: Secondary | ICD-10-CM | POA: Diagnosis not present

## 2021-11-25 DIAGNOSIS — Z8673 Personal history of transient ischemic attack (TIA), and cerebral infarction without residual deficits: Secondary | ICD-10-CM | POA: Diagnosis not present

## 2021-11-25 DIAGNOSIS — Z823 Family history of stroke: Secondary | ICD-10-CM

## 2021-11-25 DIAGNOSIS — Z8249 Family history of ischemic heart disease and other diseases of the circulatory system: Secondary | ICD-10-CM

## 2021-11-25 LAB — CBC
HCT: 42.2 % (ref 39.0–52.0)
Hemoglobin: 14.2 g/dL (ref 13.0–17.0)
MCH: 25.4 pg — ABNORMAL LOW (ref 26.0–34.0)
MCHC: 33.6 g/dL (ref 30.0–36.0)
MCV: 75.4 fL — ABNORMAL LOW (ref 80.0–100.0)
Platelets: 314 10*3/uL (ref 150–400)
RBC: 5.6 MIL/uL (ref 4.22–5.81)
RDW: 14.6 % (ref 11.5–15.5)
WBC: 4.3 10*3/uL (ref 4.0–10.5)
nRBC: 0 % (ref 0.0–0.2)

## 2021-11-25 LAB — URINALYSIS, ROUTINE W REFLEX MICROSCOPIC
Glucose, UA: NEGATIVE mg/dL
Hgb urine dipstick: NEGATIVE
Ketones, ur: 20 mg/dL — AB
Leukocytes,Ua: NEGATIVE
Nitrite: NEGATIVE
Protein, ur: 100 mg/dL — AB
Specific Gravity, Urine: 1.024 (ref 1.005–1.030)
pH: 5 (ref 5.0–8.0)

## 2021-11-25 LAB — I-STAT CHEM 8, ED
BUN: 5 mg/dL — ABNORMAL LOW (ref 8–23)
Calcium, Ion: 1.1 mmol/L — ABNORMAL LOW (ref 1.15–1.40)
Chloride: 97 mmol/L — ABNORMAL LOW (ref 98–111)
Creatinine, Ser: 0.9 mg/dL (ref 0.61–1.24)
Glucose, Bld: 98 mg/dL (ref 70–99)
HCT: 47 % (ref 39.0–52.0)
Hemoglobin: 16 g/dL (ref 13.0–17.0)
Potassium: 3.3 mmol/L — ABNORMAL LOW (ref 3.5–5.1)
Sodium: 135 mmol/L (ref 135–145)
TCO2: 23 mmol/L (ref 22–32)

## 2021-11-25 LAB — COMPREHENSIVE METABOLIC PANEL
ALT: 19 U/L (ref 0–44)
AST: 31 U/L (ref 15–41)
Albumin: 4.1 g/dL (ref 3.5–5.0)
Alkaline Phosphatase: 137 U/L — ABNORMAL HIGH (ref 38–126)
Anion gap: 16 — ABNORMAL HIGH (ref 5–15)
BUN: 7 mg/dL — ABNORMAL LOW (ref 8–23)
CO2: 23 mmol/L (ref 22–32)
Calcium: 9.7 mg/dL (ref 8.9–10.3)
Chloride: 98 mmol/L (ref 98–111)
Creatinine, Ser: 0.95 mg/dL (ref 0.61–1.24)
GFR, Estimated: >60 mL/min (ref >60)
Glucose, Bld: 99 mg/dL (ref 70–99)
Potassium: 3.3 mmol/L — ABNORMAL LOW (ref 3.5–5.1)
Sodium: 137 mmol/L (ref 135–145)
Total Bilirubin: 1.1 mg/dL (ref 0.3–1.2)
Total Protein: 8.6 g/dL — ABNORMAL HIGH (ref 6.5–8.1)

## 2021-11-25 LAB — TROPONIN I (HIGH SENSITIVITY)
Troponin I (High Sensitivity): 7 ng/L (ref <18)
Troponin I (High Sensitivity): 11 ng/L (ref <18)

## 2021-11-25 LAB — PROTIME-INR
INR: 0.9 (ref 0.8–1.2)
Prothrombin Time: 12.3 seconds (ref 11.4–15.2)

## 2021-11-25 MED ORDER — POTASSIUM CHLORIDE CRYS ER 20 MEQ PO TBCR
40.0000 meq | EXTENDED_RELEASE_TABLET | Freq: Once | ORAL | Status: AC
  Administered 2021-11-25: 40 meq via ORAL
  Filled 2021-11-25: qty 2

## 2021-11-25 MED ORDER — MORPHINE SULFATE (PF) 2 MG/ML IV SOLN
2.0000 mg | INTRAVENOUS | Status: DC | PRN
  Administered 2021-11-25 – 2021-11-30 (×13): 2 mg via INTRAVENOUS
  Filled 2021-11-25 (×13): qty 1

## 2021-11-25 MED ORDER — SODIUM CHLORIDE 0.9 % IV BOLUS
1000.0000 mL | Freq: Once | INTRAVENOUS | Status: AC
  Administered 2021-11-25: 1000 mL via INTRAVENOUS

## 2021-11-25 MED ORDER — ACETAMINOPHEN 325 MG PO TABS: 650.0000 mg | ORAL_TABLET | Freq: Four times a day (QID) | ORAL | Status: DC | PRN

## 2021-11-25 MED ORDER — HYDROMORPHONE HCL 1 MG/ML IJ SOLN
0.5000 mg | Freq: Once | INTRAMUSCULAR | Status: AC
  Administered 2021-11-25: 0.5 mg via INTRAVENOUS
  Filled 2021-11-25: qty 1

## 2021-11-25 MED ORDER — CARVEDILOL 12.5 MG PO TABS
12.5000 mg | ORAL_TABLET | Freq: Once | ORAL | Status: AC
  Administered 2021-11-25: 12.5 mg via ORAL
  Filled 2021-11-25: qty 1

## 2021-11-25 MED ORDER — NITROGLYCERIN 0.4 MG SL SUBL: 0.4000 mg | SUBLINGUAL_TABLET | SUBLINGUAL | Status: DC | PRN

## 2021-11-25 MED ORDER — IOHEXOL 300 MG/ML  SOLN
100.0000 mL | Freq: Once | INTRAMUSCULAR | Status: AC | PRN
  Administered 2021-11-25: 100 mL via INTRAVENOUS

## 2021-11-25 MED ORDER — MAGNESIUM HYDROXIDE 400 MG/5ML PO SUSP: 30.0000 mL | Freq: Every day | ORAL | Status: DC | PRN

## 2021-11-25 MED ORDER — CARVEDILOL 6.25 MG PO TABS
6.2500 mg | ORAL_TABLET | Freq: Two times a day (BID) | ORAL | Status: DC
  Administered 2021-11-25 – 2021-11-30 (×10): 6.25 mg via ORAL
  Filled 2021-11-25 (×10): qty 1

## 2021-11-25 MED ORDER — ONDANSETRON HCL 4 MG/2ML IJ SOLN
4.0000 mg | Freq: Four times a day (QID) | INTRAMUSCULAR | Status: DC | PRN
  Administered 2021-11-25 – 2021-11-29 (×9): 4 mg via INTRAVENOUS
  Filled 2021-11-25 (×10): qty 2

## 2021-11-25 MED ORDER — AMLODIPINE BESYLATE 5 MG PO TABS
5.0000 mg | ORAL_TABLET | Freq: Every morning | ORAL | Status: DC
  Administered 2021-11-25 – 2021-11-27 (×2): 5 mg via ORAL
  Filled 2021-11-25 (×3): qty 1

## 2021-11-25 MED ORDER — MECLIZINE HCL 25 MG PO TABS
25.0000 mg | ORAL_TABLET | Freq: Three times a day (TID) | ORAL | Status: DC | PRN
Start: 2021-11-25 — End: 2021-11-30
  Administered 2021-11-27 – 2021-11-29 (×3): 25 mg via ORAL
  Filled 2021-11-25 (×3): qty 1

## 2021-11-25 MED ORDER — ONDANSETRON HCL 4 MG/2ML IJ SOLN
4.0000 mg | Freq: Once | INTRAMUSCULAR | Status: AC
  Administered 2021-11-25: 4 mg via INTRAVENOUS
  Filled 2021-11-25: qty 2

## 2021-11-25 MED ORDER — AMLODIPINE BESYLATE 5 MG PO TABS
5.0000 mg | ORAL_TABLET | Freq: Once | ORAL | Status: AC
Start: 2021-11-25 — End: 2021-11-25
  Administered 2021-11-25: 5 mg via ORAL
  Filled 2021-11-25: qty 1

## 2021-11-25 MED ORDER — TAMSULOSIN HCL 0.4 MG PO CAPS
0.4000 mg | ORAL_CAPSULE | Freq: Every day | ORAL | Status: DC
  Administered 2021-11-26 – 2021-11-30 (×5): 0.4 mg via ORAL
  Filled 2021-11-25 (×5): qty 1

## 2021-11-25 MED ORDER — LOPERAMIDE HCL 2 MG PO CAPS
2.0000 mg | ORAL_CAPSULE | Freq: Three times a day (TID) | ORAL | Status: DC | PRN
Start: 2021-11-25 — End: 2021-11-30

## 2021-11-25 MED ORDER — ENOXAPARIN SODIUM 40 MG/0.4ML IJ SOSY
40.0000 mg | PREFILLED_SYRINGE | INTRAMUSCULAR | Status: DC
  Administered 2021-11-25 – 2021-11-29 (×5): 40 mg via SUBCUTANEOUS
  Filled 2021-11-25 (×5): qty 0.4

## 2021-11-25 MED ORDER — LABETALOL HCL 5 MG/ML IV SOLN
20.0000 mg | INTRAVENOUS | Status: DC | PRN
  Administered 2021-11-25 – 2021-11-26 (×2): 20 mg via INTRAVENOUS
  Filled 2021-11-25 (×2): qty 4

## 2021-11-25 MED ORDER — ONDANSETRON HCL 4 MG PO TABS
4.0000 mg | ORAL_TABLET | Freq: Four times a day (QID) | ORAL | Status: DC | PRN
  Administered 2021-11-26 – 2021-11-29 (×2): 4 mg via ORAL
  Filled 2021-11-25: qty 1

## 2021-11-25 MED ORDER — ATORVASTATIN CALCIUM 10 MG PO TABS
10.0000 mg | ORAL_TABLET | Freq: Every day | ORAL | Status: DC
  Administered 2021-11-26 – 2021-11-29 (×5): 10 mg via ORAL
  Filled 2021-11-25 (×5): qty 1

## 2021-11-25 MED ORDER — NICOTINE 14 MG/24HR TD PT24
14.0000 mg | MEDICATED_PATCH | Freq: Every day | TRANSDERMAL | Status: DC
  Administered 2021-11-26 – 2021-11-30 (×5): 14 mg via TRANSDERMAL
  Filled 2021-11-25 (×6): qty 1

## 2021-11-25 MED ORDER — TRAZODONE HCL 50 MG PO TABS
25.0000 mg | ORAL_TABLET | Freq: Every evening | ORAL | Status: DC | PRN
  Administered 2021-11-26 – 2021-11-29 (×5): 25 mg via ORAL
  Filled 2021-11-25 (×5): qty 1

## 2021-11-25 MED ORDER — SODIUM CHLORIDE 0.9 % IV SOLN: Freq: Once | INTRAVENOUS | Status: AC

## 2021-11-25 MED ORDER — HYDROMORPHONE HCL 1 MG/ML IJ SOLN
1.0000 mg | Freq: Once | INTRAMUSCULAR | Status: AC
  Administered 2021-11-25: 1 mg via INTRAVENOUS
  Filled 2021-11-25: qty 1

## 2021-11-25 MED ORDER — POTASSIUM CHLORIDE IN NACL 20-0.9 MEQ/L-% IV SOLN
INTRAVENOUS | Status: DC
  Filled 2021-11-25 (×4): qty 1000

## 2021-11-25 MED ORDER — HYDROCODONE-ACETAMINOPHEN 5-325 MG PO TABS
1.0000 | ORAL_TABLET | Freq: Four times a day (QID) | ORAL | Status: DC | PRN
  Administered 2021-11-26 – 2021-11-29 (×9): 1 via ORAL
  Filled 2021-11-25 (×9): qty 1

## 2021-11-25 MED ORDER — ACETAMINOPHEN 650 MG RE SUPP: 650.0000 mg | Freq: Four times a day (QID) | RECTAL | Status: DC | PRN

## 2021-11-25 MED ORDER — ONDANSETRON 4 MG PO TBDP
4.0000 mg | ORAL_TABLET | Freq: Once | ORAL | Status: DC
Start: 2021-11-25 — End: 2021-11-25
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
## 2021-11-25 NOTE — ED Provider Triage Note (Signed)
Emergency Medicine Provider Triage Evaluation Note  Troy Mclaughlin , a 66 y.o. male  was evaluated in triage.  Pt complains of chest pain, nausea, vomiting, dehydration, black stools x 2 days. Hx prostate cancer, starts chemo on 12/17/21. Not on blood thinner  Review of Systems  Positive: CP, HA, vomiting, black stools Negative: fever  Physical Exam  BP (!) 135/113 (BP Location: Right Arm)   Pulse (!) 112   Temp 98 F (36.7 C)   Resp 19   SpO2 100%  Gen:   Awake, no distress   Resp:  Normal effort  MSK:   Moves extremities without difficulty  Other:    Medical Decision Making  Medically screening exam initiated at 12:30 PM.  Appropriate orders placed.  Troy Mclaughlin was informed that the remainder of the evaluation will be completed by another provider, this initial triage assessment does not replace that evaluation, and the importance of remaining in the ED until their evaluation is complete.     Tacy Learn, PA-C 11/25/21 1232

## 2021-11-25 NOTE — ED Triage Notes (Signed)
Patient here from home reporting n/v x2 days. Unable to keep anything down. Cancer patient no chemo.
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
## 2021-11-26 ENCOUNTER — Inpatient Hospital Stay (HOSPITAL_COMMUNITY): Payer: Medicaid Other

## 2021-11-26 ENCOUNTER — Other Ambulatory Visit: Payer: Self-pay

## 2021-11-26 DIAGNOSIS — R071 Chest pain on breathing: Secondary | ICD-10-CM | POA: Diagnosis not present

## 2021-11-26 DIAGNOSIS — N400 Enlarged prostate without lower urinary tract symptoms: Secondary | ICD-10-CM | POA: Diagnosis not present

## 2021-11-26 DIAGNOSIS — I2510 Atherosclerotic heart disease of native coronary artery without angina pectoris: Secondary | ICD-10-CM

## 2021-11-26 DIAGNOSIS — R079 Chest pain, unspecified: Secondary | ICD-10-CM

## 2021-11-26 DIAGNOSIS — E785 Hyperlipidemia, unspecified: Secondary | ICD-10-CM

## 2021-11-26 DIAGNOSIS — K56609 Unspecified intestinal obstruction, unspecified as to partial versus complete obstruction: Secondary | ICD-10-CM | POA: Diagnosis not present

## 2021-11-26 DIAGNOSIS — R112 Nausea with vomiting, unspecified: Secondary | ICD-10-CM

## 2021-11-26 DIAGNOSIS — E1169 Type 2 diabetes mellitus with other specified complication: Secondary | ICD-10-CM

## 2021-11-26 LAB — GLUCOSE, CAPILLARY
Glucose-Capillary: 133 mg/dL — ABNORMAL HIGH (ref 70–99)
Glucose-Capillary: 77 mg/dL (ref 70–99)
Glucose-Capillary: 83 mg/dL (ref 70–99)
Glucose-Capillary: 98 mg/dL (ref 70–99)
Glucose-Capillary: 99 mg/dL (ref 70–99)

## 2021-11-26 LAB — BASIC METABOLIC PANEL
Anion gap: 11 (ref 5–15)
BUN: 10 mg/dL (ref 8–23)
CO2: 23 mmol/L (ref 22–32)
Calcium: 9 mg/dL (ref 8.9–10.3)
Chloride: 104 mmol/L (ref 98–111)
Creatinine, Ser: 0.89 mg/dL (ref 0.61–1.24)
GFR, Estimated: >60 mL/min (ref >60)
Glucose, Bld: 84 mg/dL (ref 70–99)
Potassium: 3.8 mmol/L (ref 3.5–5.1)
Sodium: 138 mmol/L (ref 135–145)

## 2021-11-26 LAB — CBC
HCT: 35.7 % — ABNORMAL LOW (ref 39.0–52.0)
Hemoglobin: 12 g/dL — ABNORMAL LOW (ref 13.0–17.0)
MCH: 25.7 pg — ABNORMAL LOW (ref 26.0–34.0)
MCHC: 33.6 g/dL (ref 30.0–36.0)
MCV: 76.4 fL — ABNORMAL LOW (ref 80.0–100.0)
Platelets: 285 10*3/uL (ref 150–400)
RBC: 4.67 MIL/uL (ref 4.22–5.81)
RDW: 14.8 % (ref 11.5–15.5)
WBC: 4.5 10*3/uL (ref 4.0–10.5)
nRBC: 0 % (ref 0.0–0.2)

## 2021-11-26 LAB — PSA: Prostatic Specific Antigen: 2.08 ng/mL (ref 0.00–4.00)

## 2021-11-26 LAB — TROPONIN I (HIGH SENSITIVITY): Troponin I (High Sensitivity): 13 ng/L (ref <18)

## 2021-11-26 LAB — HEMOGLOBIN A1C
Hgb A1c MFr Bld: 5.9 % — ABNORMAL HIGH (ref 4.8–5.6)
Mean Plasma Glucose: 122.63 mg/dL

## 2021-11-26 MED ORDER — INSULIN ASPART 100 UNIT/ML IJ SOLN
0.0000 [IU] | Freq: Three times a day (TID) | INTRAMUSCULAR | Status: DC
  Administered 2021-11-27 – 2021-11-29 (×6): 1 [IU] via SUBCUTANEOUS

## 2021-11-26 NOTE — Assessment & Plan Note (Signed)
-   Potassium will be replaced and magnesium level will be checked. ?

## 2021-11-26 NOTE — Assessment & Plan Note (Signed)
-   The patient will be continued on his antihypertensives and placed on as needed IV labetalol.

## 2021-11-26 NOTE — Assessment & Plan Note (Signed)
-   We will continue beta-blocker therapy with Coreg and as needed sublingual nitroglycerin.

## 2021-11-26 NOTE — Progress Notes (Signed)
Pt connected to bedside continuous pulse ox monitor. Alarms set & audible.

## 2021-11-26 NOTE — Plan of Care (Signed)

## 2021-11-26 NOTE — TOC Transition Note (Signed)
Transition of Care [facility]) - CM/SW Discharge Note   Patient Details  Name: Troy Mclaughlin MRN: [id number] Date of Birth: 08-31-1955  [facility] CM/SW Contact:  Vassie Moselle, LCSW Phone Number: 11/26/2021, 2:55 PM   Clinical Narrative:    Met with pt to discuss current psychosocial stressors. Pt states that he has stage IV cancer, he also has been caring for his wife who has had difficulties ambulating and has had a fall, and is also caring for his BIL who has had multiple strokes. He states finances are tight and he often does not have money to afford his medications once he has paid for all of their basic living needs and transportation.  From chart review pt has Medicaid. Pt was unaware that Medicaid offered transportation to appointments. CSW was able to provide pt with information and phone number for Medicaid transportation.  Pt was referred to [facility] who attempted to reach out to pt in 07/2021. Pt was unaware of this. Pt was provided with [facility] phone number and encouraged to reach out to them for continued case management services following this hospitalization.  Pt was also provided with [place] information for resources for food and financial assistance. He reports he has received food assistance from the [facility] in the past however, was unsure if he was still able to utilize their services as he changed office locations for his providers.    Final next level of care: Home/Self Care Barriers to Discharge: Continued Medical Work up   Patient Goals and CMS Choice Patient states their goals for this hospitalization and ongoing recovery are:: To return home   Choice offered to / list presented to : Patient  Discharge Placement                       Discharge Plan and Services                DME Arranged: N/A DME Agency: NA                  Social Determinants of Health ([place]) Interventions     Readmission Risk Interventions     11/26/2021    2:55 PM  Readmission Risk Prevention Plan  Transportation Screening Complete  PCP or Specialist Appt within 5-7 Days Complete  Home Care Screening Complete  Medication Review (RN CM) Complete

## 2021-11-26 NOTE — Progress Notes (Signed)
Spiritual care provided in response to RN referral.   Troy Mclaughlin is awaiting result of abdominal scans.  Notes he is currently a cancer pt - had two sisters die from cancer.  He cares for his spouse at home - who has fallen and currently on walker - as well as his brother in law, who has liming health condition.    Provided support around fears, exhaustion, anticipation of scan results.    In conversation, Troy Mclaughlin notes he is "sometimes not able to get medication" after he has paid rent and food.  He does not have transportation and is concerned for caring for spouse - especially around upcoming surgery.  He is open to speaking with SW for assessment and possible resources.  Referral made

## 2021-11-26 NOTE — Assessment & Plan Note (Signed)
-   The patient will be followed with serial troponins. -As needed nitroglycerin and IV morphine sulfate will be utilized.

## 2021-11-26 NOTE — Assessment & Plan Note (Signed)
Continue statin therapy.

## 2021-11-26 NOTE — Assessment & Plan Note (Signed)
-   The patient will be placed on supplement coverage with NovoLog 
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
## 2021-11-26 NOTE — Assessment & Plan Note (Signed)
-   The patient be admitted to a medical telemetry bed. - He will be kept NPO. - We will follow two-view abdomen x-ray in a.m. - He will be hydrated with IV normal saline. - Differential diagnosis would include acute gastroenteritis.

## 2021-11-26 NOTE — Assessment & Plan Note (Signed)
-   We will continue Flomax 

## 2021-11-27 DIAGNOSIS — N400 Enlarged prostate without lower urinary tract symptoms: Secondary | ICD-10-CM | POA: Diagnosis not present

## 2021-11-27 DIAGNOSIS — R071 Chest pain on breathing: Secondary | ICD-10-CM | POA: Diagnosis not present

## 2021-11-27 DIAGNOSIS — R112 Nausea with vomiting, unspecified: Secondary | ICD-10-CM | POA: Diagnosis not present

## 2021-11-27 DIAGNOSIS — K56609 Unspecified intestinal obstruction, unspecified as to partial versus complete obstruction: Secondary | ICD-10-CM | POA: Diagnosis not present

## 2021-11-27 LAB — CBC WITH DIFFERENTIAL/PLATELET
Abs Immature Granulocytes: 0.01 10*3/uL (ref 0.00–0.07)
Basophils Absolute: 0 10*3/uL (ref 0.0–0.1)
Basophils Relative: 0 %
Eosinophils Absolute: 0.1 10*3/uL (ref 0.0–0.5)
Eosinophils Relative: 2 %
HCT: 35.6 % — ABNORMAL LOW (ref 39.0–52.0)
Hemoglobin: 11.8 g/dL — ABNORMAL LOW (ref 13.0–17.0)
Immature Granulocytes: 0 %
Lymphocytes Relative: 9 %
Lymphs Abs: 0.4 10*3/uL — ABNORMAL LOW (ref 0.7–4.0)
MCH: 25.8 pg — ABNORMAL LOW (ref 26.0–34.0)
MCHC: 33.1 g/dL (ref 30.0–36.0)
MCV: 77.9 fL — ABNORMAL LOW (ref 80.0–100.0)
Monocytes Absolute: 0.5 10*3/uL (ref 0.1–1.0)
Monocytes Relative: 11 %
Neutro Abs: 3.2 10*3/uL (ref 1.7–7.7)
Neutrophils Relative %: 78 %
Platelets: 238 10*3/uL (ref 150–400)
RBC: 4.57 MIL/uL (ref 4.22–5.81)
RDW: 14.9 % (ref 11.5–15.5)
WBC: 4.1 10*3/uL (ref 4.0–10.5)
nRBC: 0 % (ref 0.0–0.2)

## 2021-11-27 LAB — MAGNESIUM: Magnesium: 1.6 mg/dL — ABNORMAL LOW (ref 1.7–2.4)

## 2021-11-27 LAB — IRON AND TIBC
Iron: 75 ug/dL (ref 45–182)
Saturation Ratios: 32 % (ref 17.9–39.5)
TIBC: 234 ug/dL — ABNORMAL LOW (ref 250–450)
UIBC: 159 ug/dL

## 2021-11-27 LAB — GLUCOSE, CAPILLARY
Glucose-Capillary: 62 mg/dL — ABNORMAL LOW (ref 70–99)
Glucose-Capillary: 146 mg/dL — ABNORMAL HIGH (ref 70–99)
Glucose-Capillary: 145 mg/dL — ABNORMAL HIGH (ref 70–99)
Glucose-Capillary: 140 mg/dL — ABNORMAL HIGH (ref 70–99)

## 2021-11-27 LAB — COMPREHENSIVE METABOLIC PANEL
ALT: 13 U/L (ref 0–44)
AST: 19 U/L (ref 15–41)
Albumin: 3.2 g/dL — ABNORMAL LOW (ref 3.5–5.0)
Alkaline Phosphatase: 96 U/L (ref 38–126)
Anion gap: 16 — ABNORMAL HIGH (ref 5–15)
BUN: 10 mg/dL (ref 8–23)
CO2: 17 mmol/L — ABNORMAL LOW (ref 22–32)
Calcium: 9.1 mg/dL (ref 8.9–10.3)
Chloride: 105 mmol/L (ref 98–111)
Creatinine, Ser: 0.74 mg/dL (ref 0.61–1.24)
GFR, Estimated: >60 mL/min (ref >60)
Glucose, Bld: 52 mg/dL — ABNORMAL LOW (ref 70–99)
Potassium: 3.5 mmol/L (ref 3.5–5.1)
Sodium: 138 mmol/L (ref 135–145)
Total Bilirubin: 1 mg/dL (ref 0.3–1.2)
Total Protein: 6.8 g/dL (ref 6.5–8.1)

## 2021-11-27 LAB — FERRITIN: Ferritin: 259 ng/mL (ref 24–336)

## 2021-11-27 LAB — RETICULOCYTES
Immature Retic Fract: 19.3 % — ABNORMAL HIGH (ref 2.3–15.9)
RBC.: 4.61 MIL/uL (ref 4.22–5.81)
Retic Count, Absolute: 38.7 10*3/uL (ref 19.0–186.0)
Retic Ct Pct: 0.8 % (ref 0.4–3.1)

## 2021-11-27 LAB — FOLATE: Folate: 29.9 ng/mL (ref >5.9)

## 2021-11-27 LAB — PHOSPHORUS: Phosphorus: 2.7 mg/dL (ref 2.5–4.6)

## 2021-11-27 LAB — TROPONIN I (HIGH SENSITIVITY): Troponin I (High Sensitivity): 6 ng/L (ref <18)

## 2021-11-27 LAB — VITAMIN B12: Vitamin B-12: 259 pg/mL (ref 180–914)

## 2021-11-27 MED ORDER — DEXTROSE 5 % IV SOLN
3.0000 g | Freq: Once | INTRAVENOUS | Status: AC
  Administered 2021-11-27: 3 g via INTRAVENOUS
  Filled 2021-11-27: qty 6

## 2021-11-27 MED ORDER — DEXTROSE-NACL 5-0.9 % IV SOLN
INTRAVENOUS | Status: DC
Start: 2021-11-27 — End: 2021-11-30

## 2021-11-27 NOTE — Progress Notes (Signed)
Writer has notified MD of pt's CBG this morning of 62.  Pt alert and oriented. New orders received and noted.

## 2021-11-27 NOTE — Plan of Care (Signed)
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
## 2021-11-28 DIAGNOSIS — K56609 Unspecified intestinal obstruction, unspecified as to partial versus complete obstruction: Secondary | ICD-10-CM | POA: Diagnosis not present

## 2021-11-28 DIAGNOSIS — R112 Nausea with vomiting, unspecified: Secondary | ICD-10-CM | POA: Diagnosis not present

## 2021-11-28 DIAGNOSIS — N400 Enlarged prostate without lower urinary tract symptoms: Secondary | ICD-10-CM | POA: Diagnosis not present

## 2021-11-28 DIAGNOSIS — R071 Chest pain on breathing: Secondary | ICD-10-CM | POA: Diagnosis not present

## 2021-11-28 LAB — COMPREHENSIVE METABOLIC PANEL
ALT: 12 U/L (ref 0–44)
AST: 16 U/L (ref 15–41)
Albumin: 3 g/dL — ABNORMAL LOW (ref 3.5–5.0)
Alkaline Phosphatase: 85 U/L (ref 38–126)
Anion gap: 7 (ref 5–15)
BUN: 5 mg/dL — ABNORMAL LOW (ref 8–23)
CO2: 28 mmol/L (ref 22–32)
Calcium: 8.8 mg/dL — ABNORMAL LOW (ref 8.9–10.3)
Chloride: 101 mmol/L (ref 98–111)
Creatinine, Ser: 0.73 mg/dL (ref 0.61–1.24)
GFR, Estimated: >60 mL/min (ref >60)
Glucose, Bld: 115 mg/dL — ABNORMAL HIGH (ref 70–99)
Potassium: 2.9 mmol/L — ABNORMAL LOW (ref 3.5–5.1)
Sodium: 136 mmol/L (ref 135–145)
Total Bilirubin: 0.6 mg/dL (ref 0.3–1.2)
Total Protein: 6.3 g/dL — ABNORMAL LOW (ref 6.5–8.1)

## 2021-11-28 LAB — CBC WITH DIFFERENTIAL/PLATELET
Abs Immature Granulocytes: 0.01 10*3/uL (ref 0.00–0.07)
Basophils Absolute: 0 10*3/uL (ref 0.0–0.1)
Basophils Relative: 0 %
Eosinophils Absolute: 0.1 10*3/uL (ref 0.0–0.5)
Eosinophils Relative: 3 %
HCT: 31.9 % — ABNORMAL LOW (ref 39.0–52.0)
Hemoglobin: 10.8 g/dL — ABNORMAL LOW (ref 13.0–17.0)
Immature Granulocytes: 0 %
Lymphocytes Relative: 12 %
Lymphs Abs: 0.4 10*3/uL — ABNORMAL LOW (ref 0.7–4.0)
MCH: 25.5 pg — ABNORMAL LOW (ref 26.0–34.0)
MCHC: 33.9 g/dL (ref 30.0–36.0)
MCV: 75.4 fL — ABNORMAL LOW (ref 80.0–100.0)
Monocytes Absolute: 0.6 10*3/uL (ref 0.1–1.0)
Monocytes Relative: 15 %
Neutro Abs: 2.6 10*3/uL (ref 1.7–7.7)
Neutrophils Relative %: 70 %
Platelets: 208 10*3/uL (ref 150–400)
RBC: 4.23 MIL/uL (ref 4.22–5.81)
RDW: 14.1 % (ref 11.5–15.5)
WBC: 3.7 10*3/uL — ABNORMAL LOW (ref 4.0–10.5)
nRBC: 0 % (ref 0.0–0.2)

## 2021-11-28 LAB — PHOSPHORUS: Phosphorus: 2.3 mg/dL — ABNORMAL LOW (ref 2.5–4.6)

## 2021-11-28 LAB — MAGNESIUM: Magnesium: 1.8 mg/dL (ref 1.7–2.4)

## 2021-11-28 LAB — GLUCOSE, CAPILLARY
Glucose-Capillary: 121 mg/dL — ABNORMAL HIGH (ref 70–99)
Glucose-Capillary: 139 mg/dL — ABNORMAL HIGH (ref 70–99)
Glucose-Capillary: 118 mg/dL — ABNORMAL HIGH (ref 70–99)
Glucose-Capillary: 118 mg/dL — ABNORMAL HIGH (ref 70–99)

## 2021-11-28 MED ORDER — POTASSIUM PHOSPHATES 15 MMOLE/5ML IV SOLN
30.0000 mmol | Freq: Once | INTRAVENOUS | Status: AC
  Administered 2021-11-28: 30 mmol via INTRAVENOUS
  Filled 2021-11-28: qty 10

## 2021-11-28 MED ORDER — PANTOPRAZOLE SODIUM 40 MG PO TBEC
40.0000 mg | DELAYED_RELEASE_TABLET | Freq: Every day | ORAL | Status: DC
  Administered 2021-11-28 – 2021-11-30 (×3): 40 mg via ORAL
  Filled 2021-11-28 (×3): qty 1

## 2021-11-28 MED ORDER — AMLODIPINE BESYLATE 10 MG PO TABS
10.0000 mg | ORAL_TABLET | Freq: Every morning | ORAL | Status: DC
  Administered 2021-11-28 – 2021-11-30 (×3): 10 mg via ORAL
  Filled 2021-11-28 (×3): qty 1

## 2021-11-28 MED ORDER — LABETALOL HCL 5 MG/ML IV SOLN
20.0000 mg | INTRAVENOUS | Status: DC | PRN
  Administered 2021-11-28: 20 mg via INTRAVENOUS
  Filled 2021-11-28: qty 4
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
## 2021-11-28 NOTE — Plan of Care (Signed)

## 2021-11-29 DIAGNOSIS — N400 Enlarged prostate without lower urinary tract symptoms: Secondary | ICD-10-CM | POA: Diagnosis not present

## 2021-11-29 DIAGNOSIS — R112 Nausea with vomiting, unspecified: Secondary | ICD-10-CM | POA: Diagnosis not present

## 2021-11-29 DIAGNOSIS — K56609 Unspecified intestinal obstruction, unspecified as to partial versus complete obstruction: Secondary | ICD-10-CM | POA: Diagnosis not present

## 2021-11-29 DIAGNOSIS — R071 Chest pain on breathing: Secondary | ICD-10-CM | POA: Diagnosis not present

## 2021-11-29 LAB — GLUCOSE, CAPILLARY
Glucose-Capillary: 99 mg/dL (ref 70–99)
Glucose-Capillary: 126 mg/dL — ABNORMAL HIGH (ref 70–99)
Glucose-Capillary: 115 mg/dL — ABNORMAL HIGH (ref 70–99)
Glucose-Capillary: 108 mg/dL — ABNORMAL HIGH (ref 70–99)

## 2021-11-29 LAB — COMPREHENSIVE METABOLIC PANEL
ALT: 12 U/L (ref 0–44)
AST: 16 U/L (ref 15–41)
Albumin: 2.8 g/dL — ABNORMAL LOW (ref 3.5–5.0)
Alkaline Phosphatase: 78 U/L (ref 38–126)
Anion gap: 7 (ref 5–15)
BUN: 5 mg/dL — ABNORMAL LOW (ref 8–23)
CO2: 27 mmol/L (ref 22–32)
Calcium: 8.8 mg/dL — ABNORMAL LOW (ref 8.9–10.3)
Chloride: 103 mmol/L (ref 98–111)
Creatinine, Ser: 0.81 mg/dL (ref 0.61–1.24)
GFR, Estimated: >60 mL/min (ref >60)
Glucose, Bld: 104 mg/dL — ABNORMAL HIGH (ref 70–99)
Potassium: 3 mmol/L — ABNORMAL LOW (ref 3.5–5.1)
Sodium: 137 mmol/L (ref 135–145)
Total Bilirubin: 0.5 mg/dL (ref 0.3–1.2)
Total Protein: 5.9 g/dL — ABNORMAL LOW (ref 6.5–8.1)

## 2021-11-29 LAB — CBC WITH DIFFERENTIAL/PLATELET
Abs Immature Granulocytes: 0 10*3/uL (ref 0.00–0.07)
Basophils Absolute: 0 10*3/uL (ref 0.0–0.1)
Basophils Relative: 1 %
Eosinophils Absolute: 0.1 10*3/uL (ref 0.0–0.5)
Eosinophils Relative: 3 %
HCT: 32 % — ABNORMAL LOW (ref 39.0–52.0)
Hemoglobin: 10.7 g/dL — ABNORMAL LOW (ref 13.0–17.0)
Immature Granulocytes: 0 %
Lymphocytes Relative: 16 %
Lymphs Abs: 0.5 10*3/uL — ABNORMAL LOW (ref 0.7–4.0)
MCH: 25.5 pg — ABNORMAL LOW (ref 26.0–34.0)
MCHC: 33.4 g/dL (ref 30.0–36.0)
MCV: 76.2 fL — ABNORMAL LOW (ref 80.0–100.0)
Monocytes Absolute: 0.6 10*3/uL (ref 0.1–1.0)
Monocytes Relative: 18 %
Neutro Abs: 2.1 10*3/uL (ref 1.7–7.7)
Neutrophils Relative %: 62 %
Platelets: 185 10*3/uL (ref 150–400)
RBC: 4.2 MIL/uL — ABNORMAL LOW (ref 4.22–5.81)
RDW: 14.6 % (ref 11.5–15.5)
WBC: 3.3 10*3/uL — ABNORMAL LOW (ref 4.0–10.5)
nRBC: 0 % (ref 0.0–0.2)

## 2021-11-29 LAB — MAGNESIUM: Magnesium: 1.5 mg/dL — ABNORMAL LOW (ref 1.7–2.4)

## 2021-11-29 LAB — PHOSPHORUS: Phosphorus: 3.4 mg/dL (ref 2.5–4.6)

## 2021-11-29 MED ORDER — POTASSIUM CHLORIDE CRYS ER 20 MEQ PO TBCR
40.0000 meq | EXTENDED_RELEASE_TABLET | Freq: Two times a day (BID) | ORAL | Status: AC
  Administered 2021-11-29 (×2): 40 meq via ORAL
  Filled 2021-11-29 (×2): qty 2

## 2021-11-29 MED ORDER — HYDRALAZINE HCL 10 MG PO TABS
10.0000 mg | ORAL_TABLET | Freq: Three times a day (TID) | ORAL | Status: DC
  Administered 2021-11-29 – 2021-11-30 (×4): 10 mg via ORAL
  Filled 2021-11-29 (×4): qty 1

## 2021-11-29 MED ORDER — MAGNESIUM SULFATE 50 % IJ SOLN
3.0000 g | Freq: Once | INTRAVENOUS | Status: AC
  Administered 2021-11-29: 3 g via INTRAVENOUS
  Filled 2021-11-29: qty 6
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
## 2021-11-30 DIAGNOSIS — R112 Nausea with vomiting, unspecified: Secondary | ICD-10-CM | POA: Diagnosis not present

## 2021-11-30 DIAGNOSIS — K56609 Unspecified intestinal obstruction, unspecified as to partial versus complete obstruction: Secondary | ICD-10-CM | POA: Diagnosis not present

## 2021-11-30 DIAGNOSIS — D638 Anemia in other chronic diseases classified elsewhere: Secondary | ICD-10-CM

## 2021-11-30 DIAGNOSIS — N400 Enlarged prostate without lower urinary tract symptoms: Secondary | ICD-10-CM | POA: Diagnosis not present

## 2021-11-30 LAB — GLUCOSE, CAPILLARY
Glucose-Capillary: 118 mg/dL — ABNORMAL HIGH (ref 70–99)
Glucose-Capillary: 112 mg/dL — ABNORMAL HIGH (ref 70–99)

## 2021-11-30 LAB — CBC WITH DIFFERENTIAL/PLATELET
Abs Immature Granulocytes: 0.01 10*3/uL (ref 0.00–0.07)
Basophils Absolute: 0 10*3/uL (ref 0.0–0.1)
Basophils Relative: 0 %
Eosinophils Absolute: 0.1 10*3/uL (ref 0.0–0.5)
Eosinophils Relative: 2 %
HCT: 31.2 % — ABNORMAL LOW (ref 39.0–52.0)
Hemoglobin: 10.5 g/dL — ABNORMAL LOW (ref 13.0–17.0)
Immature Granulocytes: 0 %
Lymphocytes Relative: 6 %
Lymphs Abs: 0.3 10*3/uL — ABNORMAL LOW (ref 0.7–4.0)
MCH: 25.8 pg — ABNORMAL LOW (ref 26.0–34.0)
MCHC: 33.7 g/dL (ref 30.0–36.0)
MCV: 76.7 fL — ABNORMAL LOW (ref 80.0–100.0)
Monocytes Absolute: 0.6 10*3/uL (ref 0.1–1.0)
Monocytes Relative: 14 %
Neutro Abs: 3.6 10*3/uL (ref 1.7–7.7)
Neutrophils Relative %: 78 %
Platelets: 176 10*3/uL (ref 150–400)
RBC: 4.07 MIL/uL — ABNORMAL LOW (ref 4.22–5.81)
RDW: 14.6 % (ref 11.5–15.5)
WBC: 4.7 10*3/uL (ref 4.0–10.5)
nRBC: 0 % (ref 0.0–0.2)

## 2021-11-30 LAB — COMPREHENSIVE METABOLIC PANEL
ALT: 12 U/L (ref 0–44)
AST: 18 U/L (ref 15–41)
Albumin: 2.9 g/dL — ABNORMAL LOW (ref 3.5–5.0)
Alkaline Phosphatase: 76 U/L (ref 38–126)
Anion gap: 5 (ref 5–15)
BUN: 6 mg/dL — ABNORMAL LOW (ref 8–23)
CO2: 24 mmol/L (ref 22–32)
Calcium: 8.6 mg/dL — ABNORMAL LOW (ref 8.9–10.3)
Chloride: 108 mmol/L (ref 98–111)
Creatinine, Ser: 0.58 mg/dL — ABNORMAL LOW (ref 0.61–1.24)
GFR, Estimated: >60 mL/min (ref >60)
Glucose, Bld: 99 mg/dL (ref 70–99)
Potassium: 3.7 mmol/L (ref 3.5–5.1)
Sodium: 137 mmol/L (ref 135–145)
Total Bilirubin: 0.3 mg/dL (ref 0.3–1.2)
Total Protein: 6.1 g/dL — ABNORMAL LOW (ref 6.5–8.1)

## 2021-11-30 LAB — MAGNESIUM: Magnesium: 1.7 mg/dL (ref 1.7–2.4)

## 2021-11-30 LAB — PHOSPHORUS: Phosphorus: 2.2 mg/dL — ABNORMAL LOW (ref 2.5–4.6)

## 2021-11-30 MED ORDER — HYDRALAZINE HCL 10 MG PO TABS: 10.0000 mg | ORAL_TABLET | Freq: Three times a day (TID) | ORAL | 0 refills | Status: DC

## 2021-11-30 MED ORDER — AMLODIPINE BESYLATE 10 MG PO TABS: 10.0000 mg | ORAL_TABLET | Freq: Every morning | ORAL | 0 refills | Status: DC

## 2021-11-30 MED ORDER — TRAZODONE HCL 50 MG PO TABS: 25.0000 mg | ORAL_TABLET | Freq: Every evening | ORAL | 0 refills | Status: DC | PRN

## 2021-11-30 MED ORDER — CARVEDILOL 6.25 MG PO TABS: 6.2500 mg | ORAL_TABLET | Freq: Two times a day (BID) | ORAL | 0 refills | Status: DC

## 2021-11-30 MED ORDER — PANTOPRAZOLE SODIUM 40 MG PO TBEC: 40.0000 mg | DELAYED_RELEASE_TABLET | Freq: Every day | ORAL | 0 refills | Status: DC

## 2021-11-30 NOTE — TOC Transition Note (Addendum)
Transition of Care [facility]) - CM/SW Discharge Note   Patient Details  Name: Troy Mclaughlin MRN: [id number] Date of Birth: 07-21-1955  [facility] CM/SW Contact:  Vassie Moselle, [place] Phone Number: 11/30/2021, 11:51 AM   Clinical Narrative:    Pt requested rolling walker for home use. RW has been ordered through [employer] and will be mailed to pt's address as they do not have any onsite.    Final next level of care: Home/Self Care Barriers to Discharge: Continued Medical Work up   Patient Goals and CMS Choice Patient states their goals for this hospitalization and ongoing recovery are:: To return home   Choice offered to / list presented to : Patient  Discharge Placement                       Discharge Plan and Services                DME Arranged: Walker rolling DME Agency: [employer] Date DME Agency Contacted: 11/30/21 Time DME Agency Contacted: [id number] Representative spoke with at DME Agency: [place] Interventions     Readmission Risk Interventions    11/26/2021    2:55 PM  Readmission Risk Prevention Plan  Transportation Screening Complete  PCP or Specialist Appt within 5-7 Days Complete  Home Care Screening Complete  Medication Review (RN CM) Complete
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
## 2021-11-30 NOTE — Plan of Care (Signed)

## 2021-12-01 ENCOUNTER — Telehealth: Payer: Self-pay

## 2021-12-01 DIAGNOSIS — M6281 Muscle weakness (generalized): Secondary | ICD-10-CM | POA: Diagnosis not present

## 2021-12-01 NOTE — Telephone Encounter (Signed)
Transition Care Management Unsuccessful Follow-up Telephone Call  Date of discharge and from where:   11/30/2021, [facility]  Attempts:  1st Attempt  Reason for unsuccessful TCM follow-up call:  Left voice message on # [phone], call back requested

## 2021-12-02 ENCOUNTER — Telehealth: Payer: Self-pay

## 2021-12-02 NOTE — Telephone Encounter (Signed)
Transition Care Management Unsuccessful Follow-up Telephone Call  Date of discharge and from where:  11/30/2021, [facility]  Attempts:  2nd Attempt  Reason for unsuccessful TCM follow-up call:  Left voice message on # [phone], call back requested.  I called [phone] and his daughter, Rich Number answered. She said she was a medical appointment and was not with him but she would have him return this call.

## 2021-12-06 ENCOUNTER — Telehealth: Payer: Self-pay

## 2021-12-06 NOTE — Telephone Encounter (Signed)
Transition Care Management Unsuccessful Follow-up Telephone Call  Date of discharge and from where:  11/30/2021, [facility]    Attempts:  3rd Attempt  Reason for unsuccessful TCM follow-up call:  Left voice message  on # [phone] and [phone], call back requested.  Letter also sent to patient requesting he contact Craig to schedule a follow up appointment as we have not been able to reach him.

## 2022-01-28 ENCOUNTER — Other Ambulatory Visit: Payer: Self-pay

## 2022-01-28 ENCOUNTER — Emergency Department (HOSPITAL_COMMUNITY): Payer: Medicaid Other

## 2022-01-28 ENCOUNTER — Emergency Department (HOSPITAL_COMMUNITY)
Admission: EM | Admit: 2022-01-28 | Discharge: 2022-01-29 | Disposition: A | Discharge status: Home / Self Care | Payer: Medicaid Other | Attending: Emergency Medicine | Admitting: Emergency Medicine

## 2022-01-28 ENCOUNTER — Encounter (HOSPITAL_COMMUNITY): Payer: Self-pay | Admitting: Emergency Medicine

## 2022-01-28 DIAGNOSIS — R1084 Generalized abdominal pain: Secondary | ICD-10-CM | POA: Diagnosis not present

## 2022-01-28 DIAGNOSIS — Y906 Blood alcohol level of 120-199 mg/100 ml: Secondary | ICD-10-CM | POA: Insufficient documentation

## 2022-01-28 DIAGNOSIS — F10120 Alcohol abuse with intoxication, uncomplicated: Secondary | ICD-10-CM | POA: Diagnosis not present

## 2022-01-28 DIAGNOSIS — Z8546 Personal history of malignant neoplasm of prostate: Secondary | ICD-10-CM | POA: Insufficient documentation

## 2022-01-28 DIAGNOSIS — I2510 Atherosclerotic heart disease of native coronary artery without angina pectoris: Secondary | ICD-10-CM | POA: Diagnosis not present

## 2022-01-28 DIAGNOSIS — F10129 Alcohol abuse with intoxication, unspecified: Secondary | ICD-10-CM

## 2022-01-28 DIAGNOSIS — R0689 Other abnormalities of breathing: Secondary | ICD-10-CM | POA: Insufficient documentation

## 2022-01-28 DIAGNOSIS — R55 Syncope and collapse: Secondary | ICD-10-CM

## 2022-01-28 DIAGNOSIS — R109 Unspecified abdominal pain: Secondary | ICD-10-CM

## 2022-01-28 DIAGNOSIS — R0902 Hypoxemia: Secondary | ICD-10-CM | POA: Diagnosis not present

## 2022-01-28 DIAGNOSIS — I10 Essential (primary) hypertension: Secondary | ICD-10-CM | POA: Diagnosis not present

## 2022-01-28 DIAGNOSIS — E876 Hypokalemia: Secondary | ICD-10-CM | POA: Diagnosis not present

## 2022-01-28 DIAGNOSIS — R110 Nausea: Secondary | ICD-10-CM | POA: Diagnosis not present

## 2022-01-28 LAB — COMPREHENSIVE METABOLIC PANEL
ALT: 30 U/L (ref 0–44)
AST: 38 U/L (ref 15–41)
Albumin: 4.1 g/dL (ref 3.5–5.0)
Alkaline Phosphatase: 85 U/L (ref 38–126)
Anion gap: 6 (ref 5–15)
BUN: 9 mg/dL (ref 8–23)
CO2: 29 mmol/L (ref 22–32)
Calcium: 9.3 mg/dL (ref 8.9–10.3)
Chloride: 105 mmol/L (ref 98–111)
Creatinine, Ser: 0.85 mg/dL (ref 0.61–1.24)
GFR, Estimated: >60 mL/min (ref >60)
Glucose, Bld: 81 mg/dL (ref 70–99)
Potassium: 3.2 mmol/L — ABNORMAL LOW (ref 3.5–5.1)
Sodium: 140 mmol/L (ref 135–145)
Total Bilirubin: 0.5 mg/dL (ref 0.3–1.2)
Total Protein: 7.6 g/dL (ref 6.5–8.1)

## 2022-01-28 LAB — CBC WITH DIFFERENTIAL/PLATELET
Abs Immature Granulocytes: 0 10*3/uL (ref 0.00–0.07)
Basophils Absolute: 0 10*3/uL (ref 0.0–0.1)
Basophils Relative: 0 %
Eosinophils Absolute: 0.1 10*3/uL (ref 0.0–0.5)
Eosinophils Relative: 4 %
HCT: 33.8 % — ABNORMAL LOW (ref 39.0–52.0)
Hemoglobin: 11.6 g/dL — ABNORMAL LOW (ref 13.0–17.0)
Immature Granulocytes: 0 %
Lymphocytes Relative: 24 %
Lymphs Abs: 0.7 10*3/uL (ref 0.7–4.0)
MCH: 25.3 pg — ABNORMAL LOW (ref 26.0–34.0)
MCHC: 34.3 g/dL (ref 30.0–36.0)
MCV: 73.8 fL — ABNORMAL LOW (ref 80.0–100.0)
Monocytes Absolute: 0.3 10*3/uL (ref 0.1–1.0)
Monocytes Relative: 11 %
Neutro Abs: 1.8 10*3/uL (ref 1.7–7.7)
Neutrophils Relative %: 61 %
Platelets: 281 10*3/uL (ref 150–400)
RBC: 4.58 MIL/uL (ref 4.22–5.81)
RDW: 14.5 % (ref 11.5–15.5)
WBC: 3 10*3/uL — ABNORMAL LOW (ref 4.0–10.5)
nRBC: 0 % (ref 0.0–0.2)

## 2022-01-28 LAB — ETHANOL: Alcohol, Ethyl (B): 171 mg/dL — ABNORMAL HIGH (ref <10)

## 2022-01-28 LAB — PROTIME-INR
INR: 1 (ref 0.8–1.2)
Prothrombin Time: 13.5 seconds (ref 11.4–15.2)

## 2022-01-28 LAB — POC OCCULT BLOOD, ED: Fecal Occult Bld: NEGATIVE

## 2022-01-28 LAB — CBG MONITORING, ED: Glucose-Capillary: 77 mg/dL (ref 70–99)

## 2022-01-28 LAB — APTT: aPTT: 27 seconds (ref 24–36)

## 2022-01-28 LAB — TROPONIN I (HIGH SENSITIVITY): Troponin I (High Sensitivity): 8 ng/L (ref <18)

## 2022-01-28 MED ORDER — ONDANSETRON HCL 4 MG/2ML IJ SOLN
4.0000 mg | Freq: Once | INTRAMUSCULAR | Status: AC
  Administered 2022-01-28: 4 mg via INTRAVENOUS
  Filled 2022-01-28: qty 2

## 2022-01-28 MED ORDER — THIAMINE HCL 100 MG/ML IJ SOLN
100.0000 mg | Freq: Once | INTRAMUSCULAR | Status: AC
  Administered 2022-01-28: 100 mg via INTRAVENOUS
  Filled 2022-01-28: qty 2

## 2022-01-28 MED ORDER — SODIUM CHLORIDE (PF) 0.9 % IJ SOLN
INTRAMUSCULAR | Status: AC
  Filled 2022-01-28: qty 50

## 2022-01-28 MED ORDER — HYDROMORPHONE HCL 1 MG/ML IJ SOLN
1.0000 mg | Freq: Once | INTRAMUSCULAR | Status: AC
  Administered 2022-01-28: 1 mg via INTRAVENOUS
  Filled 2022-01-28: qty 1

## 2022-01-28 MED ORDER — IOHEXOL 350 MG/ML SOLN
100.0000 mL | Freq: Once | INTRAVENOUS | Status: AC | PRN
  Administered 2022-01-28: 100 mL via INTRAVENOUS

## 2022-01-28 MED ORDER — PANTOPRAZOLE SODIUM 40 MG IV SOLR
40.0000 mg | Freq: Once | INTRAVENOUS | Status: AC
Start: 2022-01-28 — End: 2022-01-28
  Administered 2022-01-28: 40 mg via INTRAVENOUS
  Filled 2022-01-28: qty 10

## 2022-01-28 MED ORDER — LACTATED RINGERS IV BOLUS
1000.0000 mL | Freq: Once | INTRAVENOUS | Status: AC
  Administered 2022-01-28: 1000 mL via INTRAVENOUS

## 2022-01-28 NOTE — ED Triage Notes (Addendum)
Patient presents with syncopal episodes the last 2 days he describes as "black outs" in which he sometimes falls.  He has stage 4 colon cancer in which he has not been treated for in 27 weeks. In addition, he complains of black tarry stools. EMS noted he is easily startled.   Patient reports abdominal pain, nausea and vomiting the past 4 days.   EMS vitals: 139 CBG 100% SPO2 on room air 80 HR 190/110 BP

## 2022-01-28 NOTE — ED Provider Notes (Signed)
[facility] DEPT Provider Note   CSN: [id number] Arrival date & time: 01/28/22  1947     History {Add pertinent medical, surgical, social history, OB history to HPI:1} Chief Complaint  Patient presents with   Abdominal Pain   Loss of Consciousness   Fall   Emesis    Troy Mclaughlin is a 66 y.o. male.  With PMH of stage IV prostate cancer, CAD, SBO, alcohol abuse presenting with abdominal pain and repeat syncopal episodes.   Abdominal Pain Associated symptoms: vomiting   Loss of Consciousness Associated symptoms: vomiting   Fall Associated symptoms include abdominal pain.  Emesis Associated symptoms: abdominal pain        Home Medications Prior to Admission medications   Medication Sig Start Date End Date Taking? Authorizing Provider  acetaminophen (TYLENOL) 500 MG tablet Take 2 tablets (1,000 mg total) by mouth every 6 (six) hours as needed for moderate pain or mild pain. 05/05/21   Domenic Moras, PA-C  amLODipine (NORVASC) 10 MG tablet Take 1 tablet (10 mg total) by mouth every morning. 12/01/21   Allie Bossier, MD  atorvastatin (LIPITOR) 10 MG tablet TAKE ONE TABLET BY MOUTH EVERY MORNING Patient taking differently: Take 10 mg by mouth at bedtime. 10/07/20   [name], MD  carvedilol (COREG) 6.25 MG tablet Take 1 tablet (6.25 mg total) by mouth 2 (two) times daily with a meal. 11/30/21   Allie Bossier, MD  hydrALAZINE (APRESOLINE) 10 MG tablet Take 1 tablet (10 mg total) by mouth every 8 (eight) hours. 11/30/21   Allie Bossier, MD  HYDROcodone-acetaminophen (NORCO) 5-325 MG tablet Take 1 tablet by mouth every 6 (six) hours as needed for severe pain. Patient not taking: Reported on 11/25/2021 09/21/21   Margarita Mail, PA-C  loperamide (IMODIUM A-D) 2 MG tablet Take 1 tablet (2 mg total) by mouth 3 (three) times daily as needed for diarrhea or loose stools. Patient not taking: Reported on 11/25/2021 07/28/21   Bruning, Ashlyn, PA-C   meclizine (ANTIVERT) 25 MG tablet Take 1 tablet (25 mg total) by mouth 3 (three) times daily as needed for dizziness. 09/21/21   Harris, Vernie Shanks, PA-C  nicotine (NICODERM CQ - DOSED IN MG/24 HOURS) 14 mg/24hr patch Place 1 patch (14 mg total) onto the skin daily. 04/10/20   [name], MD  nitroGLYCERIN (NITROSTAT) 0.4 MG SL tablet DISSOLVE 1 TABLET UNDER THE TONGUE EVERY 5 MINUTES AS NEEDED FOR CHEST PAIN. DO NOT EXCEED A TOTAL OF 3 DOSES IN 15 MINUTES. Patient taking differently: Place 0.4 mg under the tongue every 5 (five) minutes as needed for chest pain. 02/03/21   [name], MD  pantoprazole (PROTONIX) 40 MG tablet Take 1 tablet (40 mg total) by mouth daily. 12/01/21   Allie Bossier, MD  traZODone (DESYREL) 50 MG tablet Take 0.5 tablets (25 mg total) by mouth at bedtime as needed for sleep. 11/30/21   Allie Bossier, MD      Allergies    Lisinopril and Metformin and related    Review of Systems   Review of Systems  Cardiovascular:  Positive for syncope.  Gastrointestinal:  Positive for abdominal pain and vomiting.    Physical Exam Updated Vital Signs BP (!) 146/92   Pulse 63   Temp 97.8 F (36.6 C) (Oral)   Resp 16   SpO2 98%  Physical Exam  ED Results / Procedures / Treatments   Labs (all labs ordered are listed, but  only abnormal results are displayed) Labs Reviewed  CBC WITH DIFFERENTIAL/PLATELET - Abnormal; Notable for the following components:      Result Value   WBC 3.0 (*)    Hemoglobin 11.6 (*)    HCT 33.8 (*)    MCV 73.8 (*)    MCH 25.3 (*)    All other components within normal limits  COMPREHENSIVE METABOLIC PANEL - Abnormal; Notable for the following components:   Potassium 3.2 (*)    All other components within normal limits  ETHANOL - Abnormal; Notable for the following components:   Alcohol, Ethyl (B) 171 (*)    All other components within normal limits  APTT  PROTIME-INR  CBG MONITORING, ED  POC OCCULT BLOOD, ED  TROPONIN I (HIGH  SENSITIVITY)    EKG EKG Interpretation  Date/Time:  Friday January 28 2022 21:19:58 EDT Ventricular Rate:  80 PR Interval:  154 QRS Duration: 80 QT Interval:  384 QTC Calculation: 443 R Axis:   63 Text Interpretation: Sinus rhythm Anterior infarct, age indeterminate Confirmed by Georgina Snell [phone]) on 01/28/2022 9:21:47 PM  Radiology No results found.  Procedures Procedures  {Document cardiac monitor, telemetry assessment procedure when appropriate:1}  Medications Ordered in ED Medications  sodium chloride (PF) 0.9 % injection (has no administration in time range)  HYDROmorphone (DILAUDID) injection 1 mg (1 mg Intravenous Given 01/28/22 2129)  ondansetron (ZOFRAN) injection 4 mg (4 mg Intravenous Given 01/28/22 2128)  pantoprazole (PROTONIX) injection 40 mg (40 mg Intravenous Given 01/28/22 2129)  lactated ringers bolus 1,000 mL (1,000 mLs Intravenous New Bag/Given 01/28/22 2222)  thiamine (VITAMIN B1) injection 100 mg (100 mg Intravenous Given 01/28/22 2221)  iohexol (OMNIPAQUE) 350 MG/ML injection 100 mL (100 mLs Intravenous Contrast Given 01/28/22 2351)    ED Course/ Medical Decision Making/ A&P                           Medical Decision Making Amount and/or Complexity of Data Reviewed Labs: ordered. Radiology: ordered. ECG/medicine tests: ordered.  Risk Prescription drug management.   ***  {Document critical care time when appropriate:1} {Document review of labs and clinical decision tools ie heart score, Chads2Vasc2 etc:1}  {Document your independent review of radiology images, and any outside records:1} {Document your discussion with family members, caretakers, and with consultants:1} {Document social determinants of health affecting pt's care:1} {Document your decision making why or why not admission, treatments were needed:1} Final Clinical Impression(s) / ED Diagnoses Final diagnoses:  Abdominal pain, unspecified abdominal location  Syncope, unspecified  syncope type  Alcohol abuse with intoxication ([place])    Rx / DC Orders ED Discharge Orders     None

## 2022-01-29 DIAGNOSIS — R55 Syncope and collapse: Secondary | ICD-10-CM | POA: Diagnosis not present

## 2022-01-29 MED ORDER — POTASSIUM CHLORIDE CRYS ER 20 MEQ PO TBCR
40.0000 meq | EXTENDED_RELEASE_TABLET | Freq: Once | ORAL | Status: AC
  Administered 2022-01-29: 40 meq via ORAL
  Filled 2022-01-29: qty 2

## 2022-01-29 NOTE — Discharge Instructions (Signed)
You have been seen today in the Emergency Department (ED)  for syncope (passing out).  Your workup including labs and EKG show reassuring results.  Your symptoms may be due to dehydration, so it is important that you drink plenty of non-alcoholic fluids. It is also important to avoid drugs and alcohol.  Please call your regular doctor as soon as possible to schedule the next available clinic appointment to follow up with him/her regarding your visit to the ED and your symptoms. You should ideally follow up with your primary doctor within one week.  Return to the Emergency Department (ED)  if you have any further syncopal episodes (pass out again) or develop ANY chest pain, pressure, tightness, palpitations, trouble breathing, sudden sweating, or other symptoms that concern you.

## 2022-01-29 NOTE — ED Provider Notes (Signed)
Patient improved, no acute distress.  He is ambulatory.  No new acute issues.  No acute findings noted on CT head or CT abdomen pelvis.  He is safe for discharge home   Ripley Fraise, MD 01/29/22 [phone]

## 2022-01-29 NOTE — ED Provider Notes (Signed)
Plan to f/u on CT imaging, if negative can likely be discharged   Ripley Fraise, MD 01/29/22 0009

## 2022-01-29 NOTE — ED Notes (Signed)
Called [employer] for cab voucher for pt.

## 2022-02-08 ENCOUNTER — Encounter: Payer: Self-pay | Admitting: *Deleted
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
## 2022-03-28 DIAGNOSIS — C61 Malignant neoplasm of prostate: Secondary | ICD-10-CM | POA: Diagnosis not present

## 2022-03-28 DIAGNOSIS — R3912 Poor urinary stream: Secondary | ICD-10-CM | POA: Diagnosis not present

## 2022-04-19 DIAGNOSIS — Z5111 Encounter for antineoplastic chemotherapy: Secondary | ICD-10-CM | POA: Diagnosis not present

## 2022-04-19 DIAGNOSIS — C61 Malignant neoplasm of prostate: Secondary | ICD-10-CM | POA: Diagnosis not present

## 2022-05-06 DIAGNOSIS — N5201 Erectile dysfunction due to arterial insufficiency: Secondary | ICD-10-CM | POA: Diagnosis not present

## 2022-05-10 ENCOUNTER — Other Ambulatory Visit: Payer: Self-pay

## 2022-06-28 ENCOUNTER — Other Ambulatory Visit (HOSPITAL_COMMUNITY): Payer: Self-pay | Admitting: Urology

## 2022-06-28 DIAGNOSIS — C61 Malignant neoplasm of prostate: Secondary | ICD-10-CM

## 2022-07-14 ENCOUNTER — Encounter (HOSPITAL_COMMUNITY)
Admission: RE | Admit: 2022-07-14 | Discharge: 2022-07-14 | Disposition: A | Discharge status: Home / Self Care | Payer: 59 | Source: Ambulatory Visit | Attending: Urology | Admitting: Urology

## 2022-07-14 DIAGNOSIS — C61 Malignant neoplasm of prostate: Secondary | ICD-10-CM | POA: Insufficient documentation

## 2022-07-14 MED ORDER — PIFLIFOLASTAT F 18 (PYLARIFY) INJECTION
9.0000 | Freq: Once | INTRAVENOUS | Status: AC
  Administered 2022-07-14: 9 via INTRAVENOUS

## 2022-09-12 ENCOUNTER — Emergency Department (HOSPITAL_COMMUNITY): Payer: Medicare Other

## 2022-09-12 ENCOUNTER — Other Ambulatory Visit: Payer: Self-pay

## 2022-09-12 ENCOUNTER — Encounter (HOSPITAL_COMMUNITY): Payer: Self-pay

## 2022-09-12 ENCOUNTER — Emergency Department (HOSPITAL_COMMUNITY)
Admission: EM | Admit: 2022-09-12 | Discharge: 2022-09-13 | Disposition: A | Discharge status: Home / Self Care | Payer: Medicare Other | Attending: Emergency Medicine | Admitting: Emergency Medicine

## 2022-09-12 DIAGNOSIS — R112 Nausea with vomiting, unspecified: Secondary | ICD-10-CM | POA: Diagnosis not present

## 2022-09-12 DIAGNOSIS — Z79899 Other long term (current) drug therapy: Secondary | ICD-10-CM | POA: Insufficient documentation

## 2022-09-12 DIAGNOSIS — R1084 Generalized abdominal pain: Secondary | ICD-10-CM

## 2022-09-12 DIAGNOSIS — R197 Diarrhea, unspecified: Secondary | ICD-10-CM | POA: Diagnosis not present

## 2022-09-12 DIAGNOSIS — C7951 Secondary malignant neoplasm of bone: Secondary | ICD-10-CM | POA: Insufficient documentation

## 2022-09-12 DIAGNOSIS — C61 Malignant neoplasm of prostate: Secondary | ICD-10-CM | POA: Insufficient documentation

## 2022-09-12 DIAGNOSIS — R1031 Right lower quadrant pain: Secondary | ICD-10-CM | POA: Diagnosis present

## 2022-09-12 DIAGNOSIS — C799 Secondary malignant neoplasm of unspecified site: Secondary | ICD-10-CM

## 2022-09-12 LAB — CBC WITH DIFFERENTIAL/PLATELET
Abs Immature Granulocytes: 0.01 10*3/uL (ref 0.00–0.07)
Basophils Absolute: 0 10*3/uL (ref 0.0–0.1)
Basophils Relative: 0 %
Eosinophils Absolute: 0.2 10*3/uL (ref 0.0–0.5)
Eosinophils Relative: 4 %
HCT: 35.9 % — ABNORMAL LOW (ref 39.0–52.0)
Hemoglobin: 12.1 g/dL — ABNORMAL LOW (ref 13.0–17.0)
Immature Granulocytes: 0 %
Lymphocytes Relative: 19 %
Lymphs Abs: 1 10*3/uL (ref 0.7–4.0)
MCH: 24.8 pg — ABNORMAL LOW (ref 26.0–34.0)
MCHC: 33.7 g/dL (ref 30.0–36.0)
MCV: 73.6 fL — ABNORMAL LOW (ref 80.0–100.0)
Monocytes Absolute: 0.5 10*3/uL (ref 0.1–1.0)
Monocytes Relative: 11 %
Neutro Abs: 3.3 10*3/uL (ref 1.7–7.7)
Neutrophils Relative %: 66 %
Platelets: 231 10*3/uL (ref 150–400)
RBC: 4.88 MIL/uL (ref 4.22–5.81)
RDW: 15.2 % (ref 11.5–15.5)
WBC: 5 10*3/uL (ref 4.0–10.5)
nRBC: 0 % (ref 0.0–0.2)

## 2022-09-12 LAB — COMPREHENSIVE METABOLIC PANEL
ALT: 11 U/L (ref 0–44)
AST: 16 U/L (ref 15–41)
Albumin: 3.9 g/dL (ref 3.5–5.0)
Alkaline Phosphatase: 103 U/L (ref 38–126)
Anion gap: 11 (ref 5–15)
BUN: 21 mg/dL (ref 8–23)
CO2: 24 mmol/L (ref 22–32)
Calcium: 8.9 mg/dL (ref 8.9–10.3)
Chloride: 99 mmol/L (ref 98–111)
Creatinine, Ser: 0.92 mg/dL (ref 0.61–1.24)
GFR, Estimated: >60 mL/min (ref >60)
Glucose, Bld: 96 mg/dL (ref 70–99)
Potassium: 3.9 mmol/L (ref 3.5–5.1)
Sodium: 134 mmol/L — ABNORMAL LOW (ref 135–145)
Total Bilirubin: 0.4 mg/dL (ref 0.3–1.2)
Total Protein: 7.6 g/dL (ref 6.5–8.1)

## 2022-09-12 LAB — LIPASE, BLOOD: Lipase: 26 U/L (ref 11–51)

## 2022-09-12 MED ORDER — IOHEXOL 300 MG/ML  SOLN
100.0000 mL | Freq: Once | INTRAMUSCULAR | Status: AC | PRN
  Administered 2022-09-12: 100 mL via INTRAVENOUS

## 2022-09-12 MED ORDER — SODIUM CHLORIDE 0.9 % IV BOLUS
500.0000 mL | Freq: Once | INTRAVENOUS | Status: AC
  Administered 2022-09-12: 500 mL via INTRAVENOUS

## 2022-09-12 MED ORDER — ONDANSETRON 4 MG PO TBDP
4.0000 mg | ORAL_TABLET | Freq: Once | ORAL | Status: AC
  Administered 2022-09-12: 4 mg via ORAL
  Filled 2022-09-12: qty 1

## 2022-09-12 MED ORDER — ONDANSETRON 4 MG PO TBDP
4.0000 mg | ORAL_TABLET | Freq: Once | ORAL | Status: AC
  Administered 2022-09-13: 4 mg via ORAL
  Filled 2022-09-12: qty 1

## 2022-09-12 MED ORDER — HYDROMORPHONE HCL 1 MG/ML IJ SOLN
1.0000 mg | Freq: Once | INTRAMUSCULAR | Status: AC
  Administered 2022-09-12: 1 mg via INTRAVENOUS
  Filled 2022-09-12: qty 1

## 2022-09-12 MED ORDER — OXYCODONE-ACETAMINOPHEN 5-325 MG PO TABS
1.0000 | ORAL_TABLET | Freq: Once | ORAL | Status: AC
  Administered 2022-09-12: 1 via ORAL
  Filled 2022-09-12: qty 1

## 2022-09-12 NOTE — ED Triage Notes (Signed)
Patient BIB [facility] from home. Has stage 4 colon cancer. Has left lower quadrant pain at baseline and now has right lower quadrant pain. Nauseous, vomiting and a cough today.
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
## 2022-09-12 NOTE — ED Provider Triage Note (Signed)
Emergency Medicine Provider Triage Evaluation Note  Troy Mclaughlin , a 67 y.o. male  was evaluated in triage.  Pt complains of abdominal pain.  Patient reports that he currently has stage IV colon cancer and has had an increase in his abdominal pain.  Abdominal pain is typically in the left lower quadrant but currently in the right lower quadrant.  Reports associated nausea and vomiting as well.  Reporting a low-grade tactile fever at home.  Not currently receiving any treatment at this time.  Reports pain is 10 out of 10.  Review of Systems  Positive: As above Negative: As above  Physical Exam  BP (!) 160/106 (BP Location: Left Arm)   Pulse 86   Temp 98.4 F (36.9 C) (Oral)   Resp 15   Ht  (1.753 m)   Wt 57 kg   SpO2 100%   BMI 18.56 kg/m  Gen:   Awake, no distress   Resp:  Normal effort  MSK:   Moves extremities without difficulty  Other:  Diffuse abdominal tenderness to light palpation  Medical Decision Making  Medically screening exam initiated at 7:30 PM.  Appropriate orders placed.  Andrej Spagnoli [place] was informed that the remainder of the evaluation will be completed by another provider, this initial triage assessment does not replace that evaluation, and the importance of remaining in the ED until their evaluation is complete.     Smitty Knudsen, PA-C 09/12/22 1931

## 2022-09-12 NOTE — Discharge Instructions (Addendum)
Please follow up with your doctor for recheck. Your CT scan today shows an area of new bony tumor in your pelvis.  Return to the ER for worsening or concerning symptoms.

## 2022-09-12 NOTE — ED Provider Notes (Signed)
11:14 PM Patient reassessed. Sleeping in the bed. Upon waking states that his pain has improved some. Not yet given PO fluids for trial. I have provided him with a [employer]. Plan for discharge if able to tolerate PO.  11:58 PM Patient tolerated PO without subsequent vomiting. Stable for discharge. Instructed to f/u with his PCP and specialists.   Antony Madura, PA-C 09/12/22 2359    Shon Baton, MD 09/13/22 [phone]

## 2022-10-03 ENCOUNTER — Telehealth: Payer: Self-pay

## 2022-10-03 DIAGNOSIS — Z515 Encounter for palliative care: Secondary | ICD-10-CM

## 2022-10-03 NOTE — Telephone Encounter (Signed)
(  2:18 pm) PC SW left a message for patient's daughter requesting a call back to discuss the palliative care referral.

## 2022-10-06 ENCOUNTER — Telehealth: Payer: Self-pay

## 2022-10-06 NOTE — Telephone Encounter (Signed)
(  4:26/24 PC SW telephoned patient's daughter-Sanita. She advised SW to call her mother @ [phone], because she was currently with patient.  SW tried calling the number provided by the daughter and received a voice mail. SW left a message requesting a call back.

## 2022-10-11 ENCOUNTER — Telehealth: Payer: Self-pay

## 2022-10-11 NOTE — Telephone Encounter (Signed)
(  4:57 pm) PC SW have made multiple attempts to contact patient, his wife or daughter to no avail. SW did speak with the daughter-Sanita once and she referred SW back to patient or his wife.  Patient will be move to inactive list due to being unable to reach him. Services can be opened if and when patient is ready.

## 2022-10-30 ENCOUNTER — Emergency Department (HOSPITAL_COMMUNITY): Payer: 59

## 2022-10-30 ENCOUNTER — Other Ambulatory Visit: Payer: Self-pay

## 2022-10-30 ENCOUNTER — Emergency Department (HOSPITAL_COMMUNITY)
Admission: EM | Admit: 2022-10-30 | Discharge: 2022-10-31 | Disposition: A | Discharge status: Home / Self Care | Payer: 59 | Attending: Emergency Medicine | Admitting: Emergency Medicine

## 2022-10-30 ENCOUNTER — Encounter (HOSPITAL_COMMUNITY): Payer: Self-pay

## 2022-10-30 DIAGNOSIS — Z859 Personal history of malignant neoplasm, unspecified: Secondary | ICD-10-CM | POA: Diagnosis not present

## 2022-10-30 DIAGNOSIS — R52 Pain, unspecified: Secondary | ICD-10-CM

## 2022-10-30 DIAGNOSIS — R109 Unspecified abdominal pain: Secondary | ICD-10-CM | POA: Insufficient documentation

## 2022-10-30 DIAGNOSIS — R5383 Other fatigue: Secondary | ICD-10-CM

## 2022-10-30 DIAGNOSIS — R1114 Bilious vomiting: Secondary | ICD-10-CM | POA: Diagnosis not present

## 2022-10-30 DIAGNOSIS — E860 Dehydration: Secondary | ICD-10-CM | POA: Diagnosis not present

## 2022-10-30 DIAGNOSIS — R112 Nausea with vomiting, unspecified: Secondary | ICD-10-CM | POA: Diagnosis present

## 2022-10-30 LAB — I-STAT CHEM 8, ED
BUN: 34 mg/dL — ABNORMAL HIGH (ref 8–23)
Calcium, Ion: 1.11 mmol/L — ABNORMAL LOW (ref 1.15–1.40)
Chloride: 93 mmol/L — ABNORMAL LOW (ref 98–111)
Creatinine, Ser: 1.6 mg/dL — ABNORMAL HIGH (ref 0.61–1.24)
Glucose, Bld: 106 mg/dL — ABNORMAL HIGH (ref 70–99)
HCT: 50 % (ref 39.0–52.0)
Hemoglobin: 17 g/dL (ref 13.0–17.0)
Potassium: 4.3 mmol/L (ref 3.5–5.1)
Sodium: 130 mmol/L — ABNORMAL LOW (ref 135–145)
TCO2: 32 mmol/L (ref 22–32)

## 2022-10-30 LAB — CBC WITH DIFFERENTIAL/PLATELET
Abs Immature Granulocytes: 0.02 10*3/uL (ref 0.00–0.07)
Basophils Absolute: 0 10*3/uL (ref 0.0–0.1)
Basophils Relative: 0 %
Eosinophils Absolute: 0 10*3/uL (ref 0.0–0.5)
Eosinophils Relative: 1 %
HCT: 44.9 % (ref 39.0–52.0)
Hemoglobin: 15.4 g/dL (ref 13.0–17.0)
Immature Granulocytes: 0 %
Lymphocytes Relative: 16 %
Lymphs Abs: 0.8 10*3/uL (ref 0.7–4.0)
MCH: 25.6 pg — ABNORMAL LOW (ref 26.0–34.0)
MCHC: 34.3 g/dL (ref 30.0–36.0)
MCV: 74.7 fL — ABNORMAL LOW (ref 80.0–100.0)
Monocytes Absolute: 0.6 10*3/uL (ref 0.1–1.0)
Monocytes Relative: 11 %
Neutro Abs: 3.8 10*3/uL (ref 1.7–7.7)
Neutrophils Relative %: 72 %
Platelets: 267 10*3/uL (ref 150–400)
RBC: 6.01 MIL/uL — ABNORMAL HIGH (ref 4.22–5.81)
RDW: 14.5 % (ref 11.5–15.5)
WBC: 5.3 10*3/uL (ref 4.0–10.5)
nRBC: 0 % (ref 0.0–0.2)

## 2022-10-30 LAB — COMPREHENSIVE METABOLIC PANEL
ALT: 16 U/L (ref 0–44)
AST: 32 U/L (ref 15–41)
Albumin: 3.7 g/dL (ref 3.5–5.0)
Alkaline Phosphatase: 135 U/L — ABNORMAL HIGH (ref 38–126)
Anion gap: 13 (ref 5–15)
BUN: 23 mg/dL (ref 8–23)
CO2: 27 mmol/L (ref 22–32)
Calcium: 9.3 mg/dL (ref 8.9–10.3)
Chloride: 90 mmol/L — ABNORMAL LOW (ref 98–111)
Creatinine, Ser: 1.62 mg/dL — ABNORMAL HIGH (ref 0.61–1.24)
GFR, Estimated: 47 mL/min — ABNORMAL LOW (ref >60)
Glucose, Bld: 116 mg/dL — ABNORMAL HIGH (ref 70–99)
Potassium: 3.9 mmol/L (ref 3.5–5.1)
Sodium: 130 mmol/L — ABNORMAL LOW (ref 135–145)
Total Bilirubin: 1 mg/dL (ref 0.3–1.2)
Total Protein: 7.3 g/dL (ref 6.5–8.1)

## 2022-10-30 LAB — URINALYSIS, ROUTINE W REFLEX MICROSCOPIC
Bacteria, UA: NONE SEEN
Bilirubin Urine: NEGATIVE
Glucose, UA: NEGATIVE mg/dL
Ketones, ur: 5 mg/dL — AB
Leukocytes,Ua: NEGATIVE
Nitrite: NEGATIVE
Protein, ur: 30 mg/dL — AB
Specific Gravity, Urine: 1.008 (ref 1.005–1.030)
pH: 7 (ref 5.0–8.0)

## 2022-10-30 LAB — LIPASE, BLOOD: Lipase: 26 U/L (ref 11–51)

## 2022-10-30 LAB — LACTIC ACID, PLASMA: Lactic Acid, Venous: 2.1 mmol/L (ref 0.5–1.9)

## 2022-10-30 MED ORDER — SODIUM CHLORIDE 0.9 % IV SOLN: INTRAVENOUS | Status: DC

## 2022-10-30 MED ORDER — SODIUM CHLORIDE 0.9 % IV BOLUS
1000.0000 mL | Freq: Once | INTRAVENOUS | Status: AC
  Administered 2022-10-30: 1000 mL via INTRAVENOUS

## 2022-10-30 MED ORDER — FENTANYL CITRATE PF 50 MCG/ML IJ SOSY
50.0000 ug | PREFILLED_SYRINGE | Freq: Once | INTRAMUSCULAR | Status: AC
  Administered 2022-10-30: 50 ug via INTRAVENOUS
  Filled 2022-10-30: qty 1

## 2022-10-30 MED ORDER — ONDANSETRON HCL 4 MG/2ML IJ SOLN
4.0000 mg | Freq: Once | INTRAMUSCULAR | Status: AC
  Administered 2022-10-30: 4 mg via INTRAVENOUS
  Filled 2022-10-30: qty 2

## 2022-10-30 MED ORDER — OXYCODONE-ACETAMINOPHEN 5-325 MG PO TABS
1.0000 | ORAL_TABLET | Freq: Once | ORAL | Status: AC
  Administered 2022-10-31: 1 via ORAL
  Filled 2022-10-30: qty 1

## 2022-10-30 MED ORDER — IOHEXOL 350 MG/ML SOLN
60.0000 mL | Freq: Once | INTRAVENOUS | Status: AC | PRN
  Administered 2022-10-30: 60 mL via INTRAVENOUS

## 2022-10-30 NOTE — ED Provider Notes (Signed)
[facility]PARTMENT AT [facility] Provider Note   CSN: [id number] Arrival date & time: 10/30/22  2050     History  Chief Complaint  Patient presents with   Fatigue    Arrived via ems from home c/o n/v/d  with dizziness x 3 days hx of cancer not able to afford home rx meds ems vs 140/90 18 130 cbg 153 gave 4mg  zofran and 1 liter LR enroute    DIRON VONDERHEIDE is a 67 y.o. male.  HPI Patient presents with abdominal pain nausea, vomiting. Onset was 3 days ago, no clear precipitant. He notes that he has not had difficulty taking his medication secondary to affordability. Since onset symptoms have been persistent, he has been unable to achieve relief with anything including Tylenol. EMS reports the patient's blood pressure was elevated, glucose elevated en route.  Patient received 1 L lactated Ringer's, as well as 4 mg Zofran, and is not currently vomiting. Last bowel movement earlier today.    Home Medications Prior to Admission medications   Medication Sig Start Date End Date Taking? Authorizing Provider  acetaminophen (TYLENOL) 500 MG tablet Take 2 tablets (1,000 mg total) by mouth every 6 (six) hours as needed for moderate pain or mild pain. 05/05/21   Fayrene Helper, PA-C  amLODipine (NORVASC) 10 MG tablet Take 1 tablet (10 mg total) by mouth every morning. 12/01/21   Drema Dallas, MD  atorvastatin (LIPITOR) 10 MG tablet TAKE ONE TABLET BY MOUTH EVERY MORNING Patient taking differently: Take 10 mg by mouth at bedtime. 10/07/20   Marcine Matar, MD  carvedilol (COREG) 6.25 MG tablet Take 1 tablet (6.25 mg total) by mouth 2 (two) times daily with a meal. 11/30/21   Drema Dallas, MD  hydrALAZINE (APRESOLINE) 10 MG tablet Take 1 tablet (10 mg total) by mouth every 8 (eight) hours. 11/30/21   Drema Dallas, MD  HYDROcodone-acetaminophen (NORCO) 5-325 MG tablet Take 1 tablet by mouth every 6 (six) hours as needed for severe pain. Patient not taking: Reported  on 11/25/2021 09/21/21   Arthor Captain, PA-C  loperamide (IMODIUM A-D) 2 MG tablet Take 1 tablet (2 mg total) by mouth 3 (three) times daily as needed for diarrhea or loose stools. Patient not taking: Reported on 11/25/2021 07/28/21   Bruning, Ashlyn, PA-C  meclizine (ANTIVERT) 25 MG tablet Take 1 tablet (25 mg total) by mouth 3 (three) times daily as needed for dizziness. 09/21/21   Harris, Cammy Copa, PA-C  nicotine (NICODERM CQ - DOSED IN MG/24 HOURS) 14 mg/24hr patch Place 1 patch (14 mg total) onto the skin daily. 04/10/20   Marcine Matar, MD  nitroGLYCERIN (NITROSTAT) 0.4 MG SL tablet DISSOLVE 1 TABLET UNDER THE TONGUE EVERY 5 MINUTES AS NEEDED FOR CHEST PAIN. DO NOT EXCEED A TOTAL OF 3 DOSES IN 15 MINUTES. Patient taking differently: Place 0.4 mg under the tongue every 5 (five) minutes as needed for chest pain. 02/03/21   Marcine Matar, MD  pantoprazole (PROTONIX) 40 MG tablet Take 1 tablet (40 mg total) by mouth daily. 12/01/21   Drema Dallas, MD  traZODone (DESYREL) 50 MG tablet Take 0.5 tablets (25 mg total) by mouth at bedtime as needed for sleep. 11/30/21   Drema Dallas, MD      Allergies    Lisinopril and Metformin and related    Review of Systems   Review of Systems  All other systems reviewed and are negative.   Physical Exam  Updated Vital Signs BP (!) 245/134   Pulse 80   Temp 97.6 F (36.4 C) (Oral)   Resp (!) 21   Ht 5\' 9"  (1.753 m)   Wt 59 kg   SpO2 98%   BMI 19.20 kg/m  Physical Exam Vitals and nursing note reviewed.  Constitutional:      General: He is not in acute distress.    Appearance: He is well-developed.  HENT:     Head: Normocephalic and atraumatic.  Eyes:     Conjunctiva/sclera: Conjunctivae normal.  Cardiovascular:     Rate and Rhythm: Normal rate and regular rhythm.  Pulmonary:     Effort: Pulmonary effort is normal. No respiratory distress.     Breath sounds: No stridor.  Abdominal:     General: There is no distension.     Tenderness:  There is abdominal tenderness.  Skin:    General: Skin is warm and dry.  Neurological:     Mental Status: He is alert and oriented to person, place, and time.     ED Results / Procedures / Treatments   Labs (all labs ordered are listed, but only abnormal results are displayed) Labs Reviewed  COMPREHENSIVE METABOLIC PANEL - Abnormal; Notable for the following components:      Result Value   Sodium 130 (*)    Chloride 90 (*)    Glucose, Bld 116 (*)    Creatinine, Ser 1.62 (*)    Alkaline Phosphatase 135 (*)    GFR, Estimated 47 (*)    All other components within normal limits  CBC WITH DIFFERENTIAL/PLATELET - Abnormal; Notable for the following components:   RBC 6.01 (*)    MCV 74.7 (*)    MCH 25.6 (*)    All other components within normal limits  URINALYSIS, ROUTINE W REFLEX MICROSCOPIC - Abnormal; Notable for the following components:   Color, Urine STRAW (*)    Hgb urine dipstick SMALL (*)    Ketones, ur 5 (*)    Protein, ur 30 (*)    All other components within normal limits  LACTIC ACID, PLASMA - Abnormal; Notable for the following components:   Lactic Acid, Venous 2.1 (*)    All other components within normal limits  I-STAT CHEM 8, ED - Abnormal; Notable for the following components:   Sodium 130 (*)    Chloride 93 (*)    BUN 34 (*)    Creatinine, Ser 1.60 (*)    Glucose, Bld 106 (*)    Calcium, Ion 1.11 (*)    All other components within normal limits  LIPASE, BLOOD    EKG None  Radiology CT ABDOMEN PELVIS W CONTRAST  Result Date: 10/30/2022 CLINICAL DATA:  Abdominal pain, acute, nonlocalized EXAM: CT ABDOMEN AND PELVIS WITH CONTRAST TECHNIQUE: Multidetector CT imaging of the abdomen and pelvis was performed using the standard protocol following bolus administration of intravenous contrast. RADIATION DOSE REDUCTION: This exam was performed according to the departmental dose-optimization program which includes automated exposure control, adjustment of the mA  and/or kV according to patient size and/or use of iterative reconstruction technique. CONTRAST:  60mL OMNIPAQUE IOHEXOL 350 MG/ML SOLN COMPARISON:  09/12/2022 FINDINGS: Lower chest: No acute abnormality. Hepatobiliary: 3 cm subcapsular hemangioma within the right hepatic lobe, axial image # 22/5, is better characterized on prior examination. Liver otherwise unremarkable. Gallbladder unremarkable. No intra or extrahepatic biliary ductal dilation. Pancreas: Unremarkable Spleen: Unremarkable Adrenals/Urinary Tract: Adrenal glands are unremarkable. The kidneys are normal in size position. 6 mm  nonobstructing calculus noted within the lower pole of the left kidney. The kidneys are otherwise unremarkable. Bladder unremarkable. Stomach/Bowel: Surgical changes of loop gastrojejunostomy are identified. The stomach, small bowel, and large bowel are otherwise unremarkable there is no evidence of obstruction or focal inflammation. Appendix normal. No free intraperitoneal gas or fluid. Vascular/Lymphatic: Moderate aortoiliac atherosclerotic calcification. No aortic aneurysm. No pathologic adenopathy within the abdomen and pelvis. Reproductive: Fiducial markers are noted within the prostate gland. The prostate gland is of normal size. Other: No abdominal wall hernia. Musculoskeletal: Heterotopic ossification is seen within the origin of the right adductor brevis likely related to remote injury. Innumerable sclerotic foci are seen throughout the visualized axial skeleton in keeping with sclerotic metastases in the setting of metastatic prostate cancer. These appear grossly stable since prior examination. No acute bone abnormality. IMPRESSION: 1. No acute intra-abdominal pathology identified. No definite radiographic explanation for the patient's reported symptoms. 2. Mild left nonobstructing nephrolithiasis. 3. 3 cm subcapsular hemangioma within the right hepatic lobe, better characterized on prior examination. 4. Surgical changes  of loop gastrojejunostomy. No evidence of obstruction. 5. Innumerable sclerotic foci throughout the visualized axial skeleton in keeping with sclerotic metastases in the setting of metastatic prostate cancer. These appear grossly stable since prior examination. Aortic Atherosclerosis (ICD10-I70.0). Electronically Signed   By: Helyn Numbers M.D.   On: 10/30/2022 23:06    Procedures Procedures    Medications Ordered in ED Medications  sodium chloride 0.9 % bolus 1,000 mL (0 mLs Intravenous Stopped 10/30/22 2208)    And  0.9 %  sodium chloride infusion ( Intravenous New Bag/Given 10/30/22 2211)  oxyCODONE-acetaminophen (PERCOCET/ROXICET) 5-325 MG per tablet 1 tablet (has no administration in time range)  fentaNYL (SUBLIMAZE) injection 50 mcg (50 mcg Intravenous Given 10/30/22 2121)  ondansetron (ZOFRAN) injection 4 mg (4 mg Intravenous Given 10/30/22 2121)  iohexol (OMNIPAQUE) 350 MG/ML injection 60 mL (60 mLs Intravenous Contrast Given 10/30/22 2242)    ED Course/ Medical Decision Making/ A&P                             Medical Decision Making Adult male with a history of cancer, prior bowel obstruction, alcoholic gastritis presents with nausea, vomiting, questionable diarrhea, and abdominal pain.  Broad differential including infection, gastritis, obstruction, mass all considered. Patient started on fluids, received IV analgesics, antiemetics, CT. Cardiac 95 sinus normal Pulse ox 100% room air normal   Amount and/or Complexity of Data Reviewed External Data Reviewed: notes.    Details: Similar presentation 3 months ago Labs: ordered. Decision-making details documented in ED Course. Radiology: ordered and independent interpretation performed. Decision-making details documented in ED Course.  Risk Prescription drug management. Decision regarding hospitalization.   12:00 AM On repeat exam patient is awake and alert sitting upright.  He is hypertensive, but in no distress.  No evidence for  new endorgan damage.  I reviewed his CT scans, no notable findings, redemonstration of his metastatic prostate cancer.  Patient requests additional analgesics, will continue additional fluids.  Findings today are most consistent with dehydration, and he has been tolerant of p.o., little evidence for decompensated state.  Patient encouraged to follow-up with his oncologist tomorrow, take his medications including antihypertensives as previously prescribed, discharge following fluid resuscitation.        Final Clinical Impression(s) / ED Diagnoses Final diagnoses:  Bilious vomiting with nausea  Other fatigue  Pain  Dehydration    Rx / DC Orders ED  Discharge Orders     None         Gerhard Munch, MD 10/31/22 0001

## 2022-10-30 NOTE — ED Triage Notes (Signed)
Arrived via ems from home c/o nv/d x 3 days

## 2022-10-30 NOTE — ED Notes (Signed)
Patient arrived via ems from home c/o 3 day hx of n/v/d head ache and falls. Patient has hx of cancer no recent treatment has not been taking rx home meds as he states he can not afford them. Pt a/o x 4 respirations even and non labored. Patient extremely hypertensive otherwise vs normal. Patient given 1 liter LR and 4 mg zofran ivp pta via ems.Patient requesting pain meds upon arrival advised he would need to se md first

## 2022-10-31 DIAGNOSIS — R1114 Bilious vomiting: Secondary | ICD-10-CM | POA: Diagnosis not present

## 2022-10-31 NOTE — Discharge Instructions (Addendum)
His evaluation has been most consistent with dehydration contributing to your fatigue and discomfort.  Please stay well-hydrated, and follow-up with your physician.  Return here for concerning changes in your condition.

## 2022-11-30 ENCOUNTER — Other Ambulatory Visit: Payer: Self-pay

## 2022-11-30 ENCOUNTER — Emergency Department (HOSPITAL_COMMUNITY)
Admission: EM | Admit: 2022-11-30 | Discharge: 2022-12-01 | Disposition: A | Discharge status: Home / Self Care | Payer: Medicare Other | Attending: Emergency Medicine | Admitting: Emergency Medicine

## 2022-11-30 ENCOUNTER — Emergency Department (HOSPITAL_COMMUNITY): Payer: Medicare Other

## 2022-11-30 DIAGNOSIS — Z79899 Other long term (current) drug therapy: Secondary | ICD-10-CM | POA: Diagnosis not present

## 2022-11-30 DIAGNOSIS — E119 Type 2 diabetes mellitus without complications: Secondary | ICD-10-CM | POA: Insufficient documentation

## 2022-11-30 DIAGNOSIS — R112 Nausea with vomiting, unspecified: Secondary | ICD-10-CM | POA: Diagnosis not present

## 2022-11-30 DIAGNOSIS — Z8546 Personal history of malignant neoplasm of prostate: Secondary | ICD-10-CM | POA: Diagnosis not present

## 2022-11-30 DIAGNOSIS — R197 Diarrhea, unspecified: Secondary | ICD-10-CM | POA: Insufficient documentation

## 2022-11-30 DIAGNOSIS — I10 Essential (primary) hypertension: Secondary | ICD-10-CM | POA: Diagnosis not present

## 2022-11-30 LAB — CBC
HCT: 42.1 % (ref 39.0–52.0)
Hemoglobin: 14.5 g/dL (ref 13.0–17.0)
MCH: 25.2 pg — ABNORMAL LOW (ref 26.0–34.0)
MCHC: 34.4 g/dL (ref 30.0–36.0)
MCV: 73.1 fL — ABNORMAL LOW (ref 80.0–100.0)
Platelets: 213 10*3/uL (ref 150–400)
RBC: 5.76 MIL/uL (ref 4.22–5.81)
RDW: 15 % (ref 11.5–15.5)
WBC: 2.9 10*3/uL — ABNORMAL LOW (ref 4.0–10.5)
nRBC: 0 % (ref 0.0–0.2)

## 2022-11-30 LAB — MAGNESIUM: Magnesium: 1.8 mg/dL (ref 1.7–2.4)

## 2022-11-30 LAB — COMPREHENSIVE METABOLIC PANEL
ALT: 19 U/L (ref 0–44)
AST: 27 U/L (ref 15–41)
Albumin: 3.7 g/dL (ref 3.5–5.0)
Alkaline Phosphatase: 179 U/L — ABNORMAL HIGH (ref 38–126)
Anion gap: 14 (ref 5–15)
BUN: 6 mg/dL — ABNORMAL LOW (ref 8–23)
CO2: 24 mmol/L (ref 22–32)
Calcium: 9.5 mg/dL (ref 8.9–10.3)
Chloride: 93 mmol/L — ABNORMAL LOW (ref 98–111)
Creatinine, Ser: 1.11 mg/dL (ref 0.61–1.24)
GFR, Estimated: >60 mL/min (ref >60)
Glucose, Bld: 89 mg/dL (ref 70–99)
Potassium: 2.7 mmol/L — CL (ref 3.5–5.1)
Sodium: 131 mmol/L — ABNORMAL LOW (ref 135–145)
Total Bilirubin: 0.3 mg/dL (ref 0.3–1.2)
Total Protein: 7.5 g/dL (ref 6.5–8.1)

## 2022-11-30 LAB — TROPONIN I (HIGH SENSITIVITY)
Troponin I (High Sensitivity): 14 ng/L (ref <18)
Troponin I (High Sensitivity): 12 ng/L (ref <18)

## 2022-11-30 LAB — LIPASE, BLOOD: Lipase: 27 U/L (ref 11–51)

## 2022-11-30 LAB — POC OCCULT BLOOD, ED: Fecal Occult Bld: NEGATIVE

## 2022-11-30 MED ORDER — FENTANYL CITRATE PF 50 MCG/ML IJ SOSY
50.0000 ug | PREFILLED_SYRINGE | Freq: Once | INTRAMUSCULAR | Status: AC
  Administered 2022-11-30: 50 ug via INTRAVENOUS
  Filled 2022-11-30: qty 1

## 2022-11-30 MED ORDER — POTASSIUM CHLORIDE 10 MEQ/100ML IV SOLN
10.0000 meq | INTRAVENOUS | Status: AC
  Administered 2022-11-30 – 2022-12-01 (×3): 10 meq via INTRAVENOUS
  Filled 2022-11-30 (×3): qty 100

## 2022-11-30 MED ORDER — SODIUM CHLORIDE 0.9 % IV BOLUS
1000.0000 mL | Freq: Once | INTRAVENOUS | Status: AC
  Administered 2022-11-30: 1000 mL via INTRAVENOUS

## 2022-11-30 MED ORDER — METOCLOPRAMIDE HCL 5 MG/ML IJ SOLN
10.0000 mg | Freq: Once | INTRAMUSCULAR | Status: AC
  Administered 2022-11-30: 10 mg via INTRAVENOUS
  Filled 2022-11-30: qty 2

## 2022-11-30 MED ORDER — IOHEXOL 350 MG/ML SOLN
75.0000 mL | Freq: Once | INTRAVENOUS | Status: AC | PRN
  Administered 2022-11-30: 75 mL via INTRAVENOUS
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
## 2022-11-30 NOTE — ED Notes (Signed)
Date and time results received: 11/30/22 21440 (use smartphrase ".now" to insert current time)  Test: Potassium Critical Value: 2.7  Name of Provider Notified: floyd  Orders Received? Or Actions Taken?: Actions Taken:

## 2022-12-01 DIAGNOSIS — R112 Nausea with vomiting, unspecified: Secondary | ICD-10-CM | POA: Diagnosis not present

## 2022-12-01 LAB — URINALYSIS, ROUTINE W REFLEX MICROSCOPIC
Bilirubin Urine: NEGATIVE
Glucose, UA: NEGATIVE mg/dL
Hgb urine dipstick: NEGATIVE
Ketones, ur: NEGATIVE mg/dL
Leukocytes,Ua: NEGATIVE
Nitrite: NEGATIVE
Protein, ur: NEGATIVE mg/dL
Specific Gravity, Urine: 1.011 (ref 1.005–1.030)
pH: 5 (ref 5.0–8.0)

## 2022-12-01 MED ORDER — POTASSIUM CHLORIDE CRYS ER 20 MEQ PO TBCR
40.0000 meq | EXTENDED_RELEASE_TABLET | Freq: Once | ORAL | Status: AC
  Administered 2022-12-01: 40 meq via ORAL
  Filled 2022-12-01: qty 2

## 2022-12-01 MED ORDER — POTASSIUM CHLORIDE CRYS ER 20 MEQ PO TBCR: 20.0000 meq | EXTENDED_RELEASE_TABLET | Freq: Two times a day (BID) | ORAL | 0 refills | Status: DC

## 2022-12-01 MED ORDER — METOCLOPRAMIDE HCL 10 MG PO TABS
10.0000 mg | ORAL_TABLET | Freq: Four times a day (QID) | ORAL | 0 refills | Status: DC
  Filled 2023-05-19: qty 30, 8d supply, fill #0

## 2022-12-01 MED ORDER — MORPHINE SULFATE (PF) 4 MG/ML IV SOLN
4.0000 mg | Freq: Once | INTRAVENOUS | Status: AC
  Administered 2022-12-01: 4 mg via INTRAVENOUS
  Filled 2022-12-01: qty 1

## 2022-12-01 NOTE — Discharge Instructions (Addendum)
You were seen in the emergency department for vomiting and diarrhea.  As we discussed your lab work was overall reassuring.  Your potassium level was low, so we gave you replacement.  I am sending some more replacement to your pharmacy.  I have also sent a prescription for the same nausea medication you received through your IV.  Your CT imaging also looked good today.  Please follow-up with your PCP and oncologist as scheduled.  Continue to monitor how you're doing and return to the ER for new or worsening symptoms.
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
## 2023-01-10 ENCOUNTER — Emergency Department (HOSPITAL_COMMUNITY)
Admission: EM | Admit: 2023-01-10 | Discharge: 2023-01-10 | Discharge status: Against Medical Advice | Payer: 59 | Attending: Emergency Medicine | Admitting: Emergency Medicine

## 2023-01-10 ENCOUNTER — Emergency Department (HOSPITAL_COMMUNITY): Payer: 59

## 2023-01-10 DIAGNOSIS — R0789 Other chest pain: Secondary | ICD-10-CM | POA: Insufficient documentation

## 2023-01-10 DIAGNOSIS — R509 Fever, unspecified: Secondary | ICD-10-CM | POA: Insufficient documentation

## 2023-01-10 DIAGNOSIS — R059 Cough, unspecified: Secondary | ICD-10-CM | POA: Diagnosis not present

## 2023-01-10 DIAGNOSIS — Z5321 Procedure and treatment not carried out due to patient leaving prior to being seen by health care provider: Secondary | ICD-10-CM | POA: Insufficient documentation

## 2023-01-10 DIAGNOSIS — Z20822 Contact with and (suspected) exposure to covid-19: Secondary | ICD-10-CM | POA: Diagnosis not present

## 2023-01-10 LAB — CBC
HCT: 39.5 % (ref 39.0–52.0)
Hemoglobin: 13.4 g/dL (ref 13.0–17.0)
MCH: 25.1 pg — ABNORMAL LOW (ref 26.0–34.0)
MCHC: 33.9 g/dL (ref 30.0–36.0)
MCV: 74 fL — ABNORMAL LOW (ref 80.0–100.0)
Platelets: 205 10*3/uL (ref 150–400)
RBC: 5.34 MIL/uL (ref 4.22–5.81)
RDW: 15.9 % — ABNORMAL HIGH (ref 11.5–15.5)
WBC: 3 10*3/uL — ABNORMAL LOW (ref 4.0–10.5)
nRBC: 0 % (ref 0.0–0.2)

## 2023-01-10 LAB — BASIC METABOLIC PANEL WITH GFR
Anion gap: 10 (ref 5–15)
BUN: 12 mg/dL (ref 8–23)
CO2: 25 mmol/L (ref 22–32)
Calcium: 9.1 mg/dL (ref 8.9–10.3)
Chloride: 97 mmol/L — ABNORMAL LOW (ref 98–111)
Creatinine, Ser: 1.04 mg/dL (ref 0.61–1.24)
GFR, Estimated: >60 mL/min
Glucose, Bld: 105 mg/dL — ABNORMAL HIGH (ref 70–99)
Potassium: 3.8 mmol/L (ref 3.5–5.1)
Sodium: 132 mmol/L — ABNORMAL LOW (ref 135–145)

## 2023-01-10 LAB — TROPONIN I (HIGH SENSITIVITY)
Troponin I (High Sensitivity): 10 ng/L (ref <18)
Troponin I (High Sensitivity): 9 ng/L

## 2023-01-10 LAB — SARS CORONAVIRUS 2 BY RT PCR: SARS Coronavirus 2 by RT PCR: NEGATIVE

## 2023-01-10 NOTE — ED Provider Triage Note (Signed)
Emergency Medicine Provider Triage Evaluation Note  Troy Mclaughlin , a 67 y.o. male  was evaluated in triage.  Pt complains of CP.  Review of Systems  Positive: CP w/ cough, cough, SOB Negative: Wheezing, BLE edema   Physical Exam  BP (!) 164/102 (BP Location: Right Arm)   Pulse 73   Temp 98.2 F (36.8 C)   Resp 19   SpO2 99%  Gen:   Awake, no distress   Resp:  Normal effort MSK:   Moves extremities without difficulty  Other:  CP worse with palpation   Medical Decision Making  Medically screening exam initiated at 11:51 AM.  Appropriate orders placed.  Troy Mclaughlin was informed that the remainder of the evaluation will be completed by another provider, this initial triage assessment does not replace that evaluation, and the importance of remaining in the ED until their evaluation is complete.  [name] stage 4 colon cancer Reports pmhx of MI    Smitty Knudsen, PA-C 01/10/23 1152

## 2023-01-10 NOTE — ED Triage Notes (Signed)
Pt c/o L sided CP that started Thursday after lifting something heavy. Pt denies SOB. Endorses a cough, chills, and tactile fever.

## 2023-01-11 NOTE — ED Provider Notes (Signed)
Left without being seen   Gareth Eagle, PA-C 01/11/23 0008    Ernie Avena, MD 01/11/23 [phone]

## 2023-01-12 ENCOUNTER — Emergency Department (HOSPITAL_COMMUNITY): Payer: 59

## 2023-01-12 ENCOUNTER — Other Ambulatory Visit: Payer: Self-pay

## 2023-01-12 ENCOUNTER — Emergency Department (HOSPITAL_COMMUNITY)
Admission: EM | Admit: 2023-01-12 | Discharge: 2023-01-12 | Disposition: A | Discharge status: Home / Self Care | Payer: 59 | Attending: Emergency Medicine | Admitting: Emergency Medicine

## 2023-01-12 ENCOUNTER — Encounter (HOSPITAL_COMMUNITY): Payer: Self-pay

## 2023-01-12 DIAGNOSIS — R059 Cough, unspecified: Secondary | ICD-10-CM | POA: Insufficient documentation

## 2023-01-12 DIAGNOSIS — R0602 Shortness of breath: Secondary | ICD-10-CM | POA: Diagnosis present

## 2023-01-12 DIAGNOSIS — Z20822 Contact with and (suspected) exposure to covid-19: Secondary | ICD-10-CM | POA: Insufficient documentation

## 2023-01-12 DIAGNOSIS — I10 Essential (primary) hypertension: Secondary | ICD-10-CM | POA: Insufficient documentation

## 2023-01-12 DIAGNOSIS — Z79899 Other long term (current) drug therapy: Secondary | ICD-10-CM | POA: Diagnosis not present

## 2023-01-12 DIAGNOSIS — E119 Type 2 diabetes mellitus without complications: Secondary | ICD-10-CM | POA: Insufficient documentation

## 2023-01-12 DIAGNOSIS — Z8546 Personal history of malignant neoplasm of prostate: Secondary | ICD-10-CM | POA: Insufficient documentation

## 2023-01-12 DIAGNOSIS — G893 Neoplasm related pain (acute) (chronic): Secondary | ICD-10-CM | POA: Insufficient documentation

## 2023-01-12 DIAGNOSIS — R0789 Other chest pain: Secondary | ICD-10-CM

## 2023-01-12 DIAGNOSIS — I2510 Atherosclerotic heart disease of native coronary artery without angina pectoris: Secondary | ICD-10-CM

## 2023-01-12 DIAGNOSIS — R509 Fever, unspecified: Secondary | ICD-10-CM | POA: Insufficient documentation

## 2023-01-12 DIAGNOSIS — C7951 Secondary malignant neoplasm of bone: Secondary | ICD-10-CM

## 2023-01-12 LAB — BASIC METABOLIC PANEL
Anion gap: 9 (ref 5–15)
BUN: 10 mg/dL (ref 8–23)
CO2: 25 mmol/L (ref 22–32)
Calcium: 8.9 mg/dL (ref 8.9–10.3)
Chloride: 98 mmol/L (ref 98–111)
Creatinine, Ser: 0.92 mg/dL (ref 0.61–1.24)
GFR, Estimated: >60 mL/min (ref >60)
Glucose, Bld: 112 mg/dL — ABNORMAL HIGH (ref 70–99)
Potassium: 3.2 mmol/L — ABNORMAL LOW (ref 3.5–5.1)
Sodium: 132 mmol/L — ABNORMAL LOW (ref 135–145)

## 2023-01-12 LAB — CBC
HCT: 40.1 % (ref 39.0–52.0)
Hemoglobin: 13.5 g/dL (ref 13.0–17.0)
MCH: 25.4 pg — ABNORMAL LOW (ref 26.0–34.0)
MCHC: 33.7 g/dL (ref 30.0–36.0)
MCV: 75.5 fL — ABNORMAL LOW (ref 80.0–100.0)
Platelets: 199 10*3/uL (ref 150–400)
RBC: 5.31 MIL/uL (ref 4.22–5.81)
RDW: 15.9 % — ABNORMAL HIGH (ref 11.5–15.5)
WBC: 3.4 10*3/uL — ABNORMAL LOW (ref 4.0–10.5)
nRBC: 0 % (ref 0.0–0.2)

## 2023-01-12 LAB — BRAIN NATRIURETIC PEPTIDE: B Natriuretic Peptide: 45.7 pg/mL (ref 0.0–100.0)

## 2023-01-12 LAB — SARS CORONAVIRUS 2 BY RT PCR: SARS Coronavirus 2 by RT PCR: NEGATIVE

## 2023-01-12 LAB — TROPONIN I (HIGH SENSITIVITY)
Troponin I (High Sensitivity): 10 ng/L (ref <18)
Troponin I (High Sensitivity): 10 ng/L (ref <18)

## 2023-01-12 MED ORDER — HYDRALAZINE HCL 10 MG PO TABS
10.0000 mg | ORAL_TABLET | Freq: Once | ORAL | Status: AC
  Administered 2023-01-12: 10 mg via ORAL
  Filled 2023-01-12: qty 1

## 2023-01-12 MED ORDER — POTASSIUM CHLORIDE CRYS ER 20 MEQ PO TBCR
40.0000 meq | EXTENDED_RELEASE_TABLET | Freq: Once | ORAL | Status: AC
  Administered 2023-01-12: 40 meq via ORAL
  Filled 2023-01-12: qty 2

## 2023-01-12 MED ORDER — SODIUM CHLORIDE (PF) 0.9 % IJ SOLN
INTRAMUSCULAR | Status: AC
  Filled 2023-01-12: qty 50

## 2023-01-12 MED ORDER — ONDANSETRON HCL 4 MG/2ML IJ SOLN
4.0000 mg | Freq: Once | INTRAMUSCULAR | Status: AC
  Administered 2023-01-12: 4 mg via INTRAVENOUS
  Filled 2023-01-12: qty 2

## 2023-01-12 MED ORDER — AMLODIPINE BESYLATE 5 MG PO TABS
10.0000 mg | ORAL_TABLET | Freq: Once | ORAL | Status: AC
  Administered 2023-01-12: 10 mg via ORAL
  Filled 2023-01-12: qty 2

## 2023-01-12 MED ORDER — HYDROMORPHONE HCL 1 MG/ML IJ SOLN
0.5000 mg | Freq: Once | INTRAMUSCULAR | Status: AC
  Administered 2023-01-12: 0.5 mg via INTRAVENOUS
  Filled 2023-01-12: qty 1

## 2023-01-12 MED ORDER — CARVEDILOL 3.125 MG PO TABS: 6.2500 mg | ORAL_TABLET | Freq: Two times a day (BID) | ORAL | Status: DC

## 2023-01-12 MED ORDER — ACETAMINOPHEN 500 MG PO TABS
1000.0000 mg | ORAL_TABLET | Freq: Once | ORAL | Status: AC
  Administered 2023-01-12: 1000 mg via ORAL
  Filled 2023-01-12: qty 2

## 2023-01-12 MED ORDER — ATORVASTATIN CALCIUM 10 MG PO TABS
10.0000 mg | ORAL_TABLET | Freq: Every morning | ORAL | 0 refills | Status: DC
Start: 2023-01-12 — End: 2023-08-04
  Filled 2023-01-12 – 2023-01-13 (×2): qty 30, 30d supply, fill #0

## 2023-01-12 MED ORDER — CARVEDILOL 6.25 MG PO TABS
6.2500 mg | ORAL_TABLET | Freq: Two times a day (BID) | ORAL | 0 refills | Status: DC
Start: 2023-01-12 — End: 2023-04-10
  Filled 2023-01-12 – 2023-01-13 (×2): qty 60, 30d supply, fill #0

## 2023-01-12 MED ORDER — PANTOPRAZOLE SODIUM 40 MG PO TBEC
40.0000 mg | DELAYED_RELEASE_TABLET | Freq: Every day | ORAL | 0 refills | Status: DC
Start: 2023-01-12 — End: 2023-07-19
  Filled 2023-01-12 – 2023-01-13 (×2): qty 30, 30d supply, fill #0

## 2023-01-12 MED ORDER — ONDANSETRON 4 MG PO TBDP: 4.0000 mg | ORAL_TABLET | Freq: Three times a day (TID) | ORAL | 0 refills | Status: DC | PRN

## 2023-01-12 MED ORDER — ONDANSETRON 4 MG PO TBDP
4.0000 mg | ORAL_TABLET | Freq: Three times a day (TID) | ORAL | 0 refills | Status: DC | PRN
Start: 2023-01-12 — End: 2023-07-19
  Filled 2023-01-12 – 2023-01-13 (×3): qty 20, 7d supply, fill #0

## 2023-01-12 MED ORDER — METHOCARBAMOL 500 MG PO TABS
500.0000 mg | ORAL_TABLET | Freq: Once | ORAL | Status: AC
  Administered 2023-01-12: 500 mg via ORAL
  Filled 2023-01-12: qty 1

## 2023-01-12 MED ORDER — IOHEXOL 350 MG/ML SOLN
80.0000 mL | Freq: Once | INTRAVENOUS | Status: AC | PRN
  Administered 2023-01-12: 75 mL via INTRAVENOUS

## 2023-01-12 MED ORDER — AMLODIPINE BESYLATE 10 MG PO TABS
10.0000 mg | ORAL_TABLET | Freq: Every morning | ORAL | 0 refills | Status: DC
Start: 2023-01-12 — End: 2023-04-10
  Filled 2023-01-12 – 2023-01-13 (×2): qty 30, 30d supply, fill #0

## 2023-01-12 MED ORDER — OXYCODONE HCL 5 MG PO TABS: 5.0000 mg | ORAL_TABLET | ORAL | 0 refills | Status: DC | PRN

## 2023-01-12 MED ORDER — OXYCODONE HCL 5 MG PO TABS
5.0000 mg | ORAL_TABLET | ORAL | 0 refills | Status: DC | PRN
Start: 2023-01-12 — End: 2023-03-24
  Filled 2023-01-12 – 2023-01-13 (×3): qty 20, 4d supply, fill #0

## 2023-01-12 MED ORDER — HYDRALAZINE HCL 10 MG PO TABS
10.0000 mg | ORAL_TABLET | Freq: Three times a day (TID) | ORAL | 0 refills | Status: DC
Start: 2023-01-12 — End: 2023-07-19
  Filled 2023-01-12 – 2023-01-13 (×2): qty 90, 30d supply, fill #0

## 2023-01-12 NOTE — ED Notes (Addendum)
Troy Mclaughlin, daughter and [name], daughter called for transport home. No answer at either phone.
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
## 2023-01-12 NOTE — ED Notes (Addendum)
Pt states "I need an uber" Explained to patient that we can give him a bus pass, we do not proved [employer] to patients. Pt states "I need to talk to someone because they told me they would find me a ride home".

## 2023-01-12 NOTE — Care Management (Signed)
ED RNCM spoke with patient concerning difficulties obtaining his medications. Patient reports that he is unable to afford a couple of his prescription. RNCM noted that patient has Medicare and Medicaid, patient should have a small copay. Unable to verify with [facility] on [address] they are closed until tomorrow.  Discussed with EDP and requested prescription be sent TOC pjaramcy  and patient will come back tomorrow to pick up his meds. Also updated [facility] pharmacy staff as well.

## 2023-01-12 NOTE — Discharge Instructions (Addendum)
Thank you for coming to [facility] Emergency Department. You were seen for chest wall pain. We did an exam, labs, and imaging, and these unfortunately showed bone metastases from your prostate cancer. Please call Dr. Cardell Peach with urology to schedule an appointment for follow up. You can take tylenol 1 g every 8 hours for pain as well as oxycodone 5 mg every 4-6 hours as needed for severe pain. We have also prescribed zofran 4 mg under the tongue every 6-8 hours as needed for nausea. Your other medications were refilled and sent to the [facility]. Please take your medication as prescribed.  Please follow-up with your primary care physician for management of your hypertension.  Do not hesitate to return to the ED or call 911 if you experience: -Worsening symptoms -Shortness of breath -Lightheadedness, passing out -Fevers/chills -Anything else that concerns you

## 2023-01-12 NOTE — ED Triage Notes (Addendum)
Patient BIB [facility] from home. Chest pain for a week that comes and goes with movement. Painful to take deep breaths. No nausea or vomiting. Has a cough with white mucus.

## 2023-01-13 ENCOUNTER — Other Ambulatory Visit (HOSPITAL_COMMUNITY): Payer: Self-pay

## 2023-01-13 ENCOUNTER — Other Ambulatory Visit: Payer: Self-pay

## 2023-01-25 ENCOUNTER — Other Ambulatory Visit (HOSPITAL_COMMUNITY): Payer: Self-pay

## 2023-01-30 ENCOUNTER — Other Ambulatory Visit (HOSPITAL_COMMUNITY): Payer: Self-pay

## 2023-02-10 ENCOUNTER — Other Ambulatory Visit (HOSPITAL_COMMUNITY): Payer: Self-pay

## 2023-02-10 MED ORDER — OXYCODONE-ACETAMINOPHEN 5-325 MG PO TABS
1.0000 | ORAL_TABLET | Freq: Three times a day (TID) | ORAL | 0 refills | Status: DC | PRN
  Filled 2023-02-10 – 2023-03-09 (×2): qty 15, 5d supply, fill #0

## 2023-02-10 MED ORDER — AMLODIPINE BESYLATE 5 MG PO TABS
5.0000 mg | ORAL_TABLET | Freq: Every day | ORAL | 0 refills | Status: DC
  Filled 2023-02-10 – 2023-03-09 (×2): qty 30, 30d supply, fill #0

## 2023-02-17 ENCOUNTER — Other Ambulatory Visit (HOSPITAL_COMMUNITY): Payer: Self-pay | Admitting: Urology

## 2023-02-17 DIAGNOSIS — C61 Malignant neoplasm of prostate: Secondary | ICD-10-CM

## 2023-02-20 ENCOUNTER — Telehealth: Payer: Self-pay | Admitting: Radiation Oncology

## 2023-02-20 ENCOUNTER — Other Ambulatory Visit (HOSPITAL_COMMUNITY): Payer: Self-pay

## 2023-02-20 NOTE — Telephone Encounter (Signed)
Left message for patient's daughter to call back to schedule consult per 9/24 referral.

## 2023-02-20 NOTE — Progress Notes (Signed)
Patient is scheduled for his PSMA PET on 10/9.  RN spoke with patient's daughter to inform of scan.  Patient will be scheduled for Rad Onc post 10/9.

## 2023-02-21 ENCOUNTER — Telehealth: Payer: Self-pay | Admitting: Radiation Oncology

## 2023-02-21 NOTE — Telephone Encounter (Signed)
Left message for patient's daughter to call back to schedule consult per 9/24 referral.

## 2023-02-23 ENCOUNTER — Telehealth: Payer: Self-pay | Admitting: Radiation Oncology

## 2023-02-23 NOTE — Telephone Encounter (Signed)
Left message for patient's daughter to call back to schedule consult per 9/24 referral.

## 2023-02-24 ENCOUNTER — Telehealth: Payer: Self-pay | Admitting: Radiation Oncology

## 2023-02-24 NOTE — Telephone Encounter (Signed)
Mailed letter for patient to call back to schedule consult per 9/24 referral.

## 2023-03-01 ENCOUNTER — Encounter (HOSPITAL_COMMUNITY)
Admission: RE | Admit: 2023-03-01 | Discharge: 2023-03-01 | Disposition: A | Discharge status: Home / Self Care | Payer: 59 | Source: Ambulatory Visit | Attending: Urology | Admitting: Urology

## 2023-03-01 DIAGNOSIS — C61 Malignant neoplasm of prostate: Secondary | ICD-10-CM | POA: Diagnosis present

## 2023-03-01 MED ORDER — FLOTUFOLASTAT F 18 GALLIUM 296-5846 MBQ/ML IV SOLN
7.9400 | Freq: Once | INTRAVENOUS | Status: AC
  Administered 2023-03-01: 7.94 via INTRAVENOUS

## 2023-03-07 NOTE — Progress Notes (Signed)
STAT read request placed on recent PSMA PET from 10/9 due to upcoming consult on 10/17.

## 2023-03-09 ENCOUNTER — Ambulatory Visit
Admission: RE | Admit: 2023-03-09 | Discharge: 2023-03-09 | Disposition: A | Discharge status: Home / Self Care | Payer: 59 | Source: Ambulatory Visit | Attending: Urology | Admitting: Urology

## 2023-03-09 ENCOUNTER — Other Ambulatory Visit (HOSPITAL_COMMUNITY): Payer: Self-pay

## 2023-03-09 ENCOUNTER — Encounter: Payer: Self-pay | Admitting: Urology

## 2023-03-09 VITALS — BP 168/94 | HR 71 | Temp 96.8°F | Resp 18 | Ht 69.0 in | Wt 135.1 lb

## 2023-03-09 DIAGNOSIS — Z79899 Other long term (current) drug therapy: Secondary | ICD-10-CM | POA: Insufficient documentation

## 2023-03-09 DIAGNOSIS — I252 Old myocardial infarction: Secondary | ICD-10-CM | POA: Insufficient documentation

## 2023-03-09 DIAGNOSIS — E119 Type 2 diabetes mellitus without complications: Secondary | ICD-10-CM | POA: Insufficient documentation

## 2023-03-09 DIAGNOSIS — C7951 Secondary malignant neoplasm of bone: Secondary | ICD-10-CM | POA: Diagnosis present

## 2023-03-09 DIAGNOSIS — F17210 Nicotine dependence, cigarettes, uncomplicated: Secondary | ICD-10-CM | POA: Insufficient documentation

## 2023-03-09 DIAGNOSIS — K219 Gastro-esophageal reflux disease without esophagitis: Secondary | ICD-10-CM | POA: Insufficient documentation

## 2023-03-09 DIAGNOSIS — Z8719 Personal history of other diseases of the digestive system: Secondary | ICD-10-CM | POA: Diagnosis not present

## 2023-03-09 DIAGNOSIS — Z923 Personal history of irradiation: Secondary | ICD-10-CM | POA: Diagnosis not present

## 2023-03-09 DIAGNOSIS — G893 Neoplasm related pain (acute) (chronic): Secondary | ICD-10-CM | POA: Diagnosis not present

## 2023-03-09 DIAGNOSIS — C61 Malignant neoplasm of prostate: Secondary | ICD-10-CM

## 2023-03-09 DIAGNOSIS — Z192 Hormone resistant malignancy status: Secondary | ICD-10-CM | POA: Insufficient documentation

## 2023-03-09 DIAGNOSIS — Z8711 Personal history of peptic ulcer disease: Secondary | ICD-10-CM | POA: Diagnosis not present

## 2023-03-09 DIAGNOSIS — I10 Essential (primary) hypertension: Secondary | ICD-10-CM | POA: Diagnosis not present

## 2023-03-09 DIAGNOSIS — F1010 Alcohol abuse, uncomplicated: Secondary | ICD-10-CM | POA: Diagnosis not present

## 2023-03-09 DIAGNOSIS — Z800 Family history of malignant neoplasm of digestive organs: Secondary | ICD-10-CM | POA: Insufficient documentation

## 2023-03-09 DIAGNOSIS — Z8673 Personal history of transient ischemic attack (TIA), and cerebral infarction without residual deficits: Secondary | ICD-10-CM | POA: Insufficient documentation

## 2023-03-09 DIAGNOSIS — C799 Secondary malignant neoplasm of unspecified site: Secondary | ICD-10-CM | POA: Insufficient documentation

## 2023-03-09 NOTE — Progress Notes (Signed)
Nursing interview for Reconsult due to a diagnosis of Recurring Malignant neoplasm of prostate (HCC).  Patient identity verified x2.   Patient reports dysuria 10/10, urinary urgency, moderate urinary impotence and severe ED (see I-PSS score below).  Meaningful use complete.  I-PSS score- 28 - Severe SHIM score- 1- No sexual activity within last 6 months. Urinary Management medication(s) None Urology appointment date- 05/11/23 with Dr. Cardell Peach at [facility]  Vitals- BP (!) 168/94 (BP Location: Left Arm, Patient Position: Sitting, Cuff Size: Normal)   Pulse 71   Temp (!) 96.8 F (36 C) (Temporal)   Resp 18   Ht 5\' 9"  (1.753 m)   Wt 135 lb 2 oz (61.3 kg)   SpO2 100%   BMI 19.95 kg/m   This concludes the interaction.  Troy Favors, LPN
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
## 2023-03-10 ENCOUNTER — Other Ambulatory Visit: Payer: Self-pay | Admitting: Genetic Counselor

## 2023-03-10 DIAGNOSIS — Z1379 Encounter for other screening for genetic and chromosomal anomalies: Secondary | ICD-10-CM

## 2023-03-13 ENCOUNTER — Telehealth: Payer: Self-pay

## 2023-03-13 ENCOUNTER — Ambulatory Visit: Payer: 59 | Admitting: Radiation Oncology

## 2023-03-13 NOTE — Telephone Encounter (Signed)
Pt. Notified that transportation services have been set up for this week (the week of 03/13/2023), by Dorna Leitz and that he will need to contact Christian Vilsaint at [phone], to arrange next weeks transportation. Patient verbalized understanding.  This concludes the interaction.  Ruel Favors, LPN

## 2023-03-15 ENCOUNTER — Ambulatory Visit: Payer: 59 | Admitting: Radiation Oncology

## 2023-03-15 NOTE — Progress Notes (Signed)
Radiation Oncology         [phone] ________________________________  Name: SHU FAZZINO MRN: [id number]  Date: 03/16/2023  DOB: [birth year]  SIMULATION AND TREATMENT PLANNING NOTE    ICD-10-CM   1. Malignant neoplasm of prostate (HCC)  C61     2. 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  Identity was confirmed.  All relevant records and images related to the planned course of therapy were reviewed.  The patient freely provided informed written consent to proceed with treatment after reviewing the details related to the planned course of therapy. The consent form was witnessed and verified by the simulation staff.  Then, the patient was set-up in a stable reproducible  supine position for radiation therapy.  CT images were obtained.  Surface markings were placed.  The CT images were loaded into the planning software.  Then the target and avoidance structures were contoured.  Treatment planning then occurred.  The radiation prescription was entered and confirmed.  Then, I designed and supervised the construction of a total of 3 medically necessary complex treatment devices consisting of leg positioner and MLC apertures to cover the treated hip area.  I have requested : 3D Simulation  I have requested a DVH of the following structures: Rectum, Bladder, femoral heads and target.  PLAN:  The patient will receive 30 Gy in 10 fractions to treat the painful bony metastases in the left 2nd, 6th, 10th and 12th ribs as well as the left hip.  ________________________________  Artist Pais Kathrynn Running, M.D.

## 2023-03-16 ENCOUNTER — Ambulatory Visit
Admission: RE | Admit: 2023-03-16 | Discharge: 2023-03-16 | Disposition: A | Discharge status: Home / Self Care | Payer: 59 | Source: Ambulatory Visit | Attending: Radiation Oncology | Admitting: Radiation Oncology

## 2023-03-16 ENCOUNTER — Ambulatory Visit: Payer: 59

## 2023-03-16 ENCOUNTER — Other Ambulatory Visit: Payer: Self-pay

## 2023-03-16 DIAGNOSIS — Z510 Encounter for antineoplastic radiation therapy: Secondary | ICD-10-CM | POA: Diagnosis present

## 2023-03-16 DIAGNOSIS — C7951 Secondary malignant neoplasm of bone: Secondary | ICD-10-CM | POA: Diagnosis not present

## 2023-03-16 DIAGNOSIS — C61 Malignant neoplasm of prostate: Secondary | ICD-10-CM | POA: Diagnosis not present

## 2023-03-16 NOTE — Progress Notes (Signed)
Troy Mclaughlin expressed needed help with food insecurity and financial assistance for bills.  RN got patient a food bag and put referral in to social work for assistance.  Also reached out to transportation coordinator to assist him with transportation for upcoming appointments starting next 03/20/2023.  [name] has ride for today's appointment and has left building.  RN told patient someone from social work and transportation will be reaching out to him to answer phone.

## 2023-03-17 ENCOUNTER — Ambulatory Visit: Payer: 59

## 2023-03-17 DIAGNOSIS — Z510 Encounter for antineoplastic radiation therapy: Secondary | ICD-10-CM | POA: Diagnosis not present

## 2023-03-20 ENCOUNTER — Other Ambulatory Visit: Payer: Self-pay

## 2023-03-20 ENCOUNTER — Inpatient Hospital Stay: Payer: 59 | Attending: Radiation Oncology

## 2023-03-20 ENCOUNTER — Ambulatory Visit
Admission: RE | Admit: 2023-03-20 | Discharge: 2023-03-20 | Disposition: A | Discharge status: Home / Self Care | Payer: 59 | Source: Ambulatory Visit | Attending: Radiation Oncology | Admitting: Radiation Oncology

## 2023-03-20 DIAGNOSIS — Z510 Encounter for antineoplastic radiation therapy: Secondary | ICD-10-CM | POA: Diagnosis not present

## 2023-03-20 LAB — RAD ONC ARIA SESSION SUMMARY

## 2023-03-20 NOTE — Progress Notes (Signed)
[facility] Clinical Social Work  Clinical Social Work was referred by self for assessment of psychosocial needs.  Clinical Social Worker met with patient to offer support and assess for needs.  Patient expressed primary needs as financial assistance. Patient inquired about grants he received during his initial round of treatment. Patient previously applied for [employer] and [employer] through [place]. CSW informed patient the [employer] has been discontinued and that he utilized his [employer].  Patient verbalized understanding. Patient was provided a toiletry and food bag.   Marguerita Merles, LCSWA Clinical Social Worker [facility]

## 2023-03-21 ENCOUNTER — Other Ambulatory Visit: Payer: Self-pay

## 2023-03-21 ENCOUNTER — Ambulatory Visit
Admission: RE | Admit: 2023-03-21 | Discharge: 2023-03-21 | Disposition: A | Discharge status: Home / Self Care | Payer: 59 | Source: Ambulatory Visit | Attending: Radiation Oncology | Admitting: Radiation Oncology

## 2023-03-21 DIAGNOSIS — Z510 Encounter for antineoplastic radiation therapy: Secondary | ICD-10-CM | POA: Diagnosis not present

## 2023-03-21 LAB — RAD ONC ARIA SESSION SUMMARY

## 2023-03-22 ENCOUNTER — Ambulatory Visit
Admission: RE | Admit: 2023-03-22 | Discharge: 2023-03-22 | Disposition: A | Discharge status: Home / Self Care | Payer: 59 | Source: Ambulatory Visit | Attending: Radiation Oncology

## 2023-03-22 ENCOUNTER — Other Ambulatory Visit: Payer: Self-pay

## 2023-03-22 DIAGNOSIS — Z510 Encounter for antineoplastic radiation therapy: Secondary | ICD-10-CM | POA: Diagnosis not present

## 2023-03-22 LAB — RAD ONC ARIA SESSION SUMMARY

## 2023-03-23 ENCOUNTER — Other Ambulatory Visit: Payer: Self-pay

## 2023-03-23 ENCOUNTER — Ambulatory Visit
Admission: RE | Admit: 2023-03-23 | Discharge: 2023-03-23 | Disposition: A | Discharge status: Home / Self Care | Payer: 59 | Source: Ambulatory Visit | Attending: Radiation Oncology | Admitting: Radiation Oncology

## 2023-03-23 DIAGNOSIS — Z510 Encounter for antineoplastic radiation therapy: Secondary | ICD-10-CM | POA: Diagnosis not present

## 2023-03-23 LAB — RAD ONC ARIA SESSION SUMMARY

## 2023-03-24 ENCOUNTER — Ambulatory Visit
Admission: RE | Admit: 2023-03-24 | Discharge: 2023-03-24 | Disposition: A | Discharge status: Home / Self Care | Payer: 59 | Source: Ambulatory Visit | Attending: Radiation Oncology | Admitting: Radiation Oncology

## 2023-03-24 ENCOUNTER — Other Ambulatory Visit: Payer: Self-pay | Admitting: Radiation Oncology

## 2023-03-24 ENCOUNTER — Other Ambulatory Visit: Payer: Self-pay

## 2023-03-24 DIAGNOSIS — E119 Type 2 diabetes mellitus without complications: Secondary | ICD-10-CM | POA: Insufficient documentation

## 2023-03-24 DIAGNOSIS — D72810 Lymphocytopenia: Secondary | ICD-10-CM | POA: Diagnosis not present

## 2023-03-24 DIAGNOSIS — E785 Hyperlipidemia, unspecified: Secondary | ICD-10-CM | POA: Diagnosis not present

## 2023-03-24 DIAGNOSIS — E538 Deficiency of other specified B group vitamins: Secondary | ICD-10-CM | POA: Diagnosis not present

## 2023-03-24 DIAGNOSIS — C7951 Secondary malignant neoplasm of bone: Secondary | ICD-10-CM | POA: Insufficient documentation

## 2023-03-24 DIAGNOSIS — C61 Malignant neoplasm of prostate: Secondary | ICD-10-CM | POA: Diagnosis not present

## 2023-03-24 DIAGNOSIS — Z510 Encounter for antineoplastic radiation therapy: Secondary | ICD-10-CM | POA: Diagnosis present

## 2023-03-24 LAB — RAD ONC ARIA SESSION SUMMARY

## 2023-03-24 MED ORDER — OXYCODONE HCL 5 MG PO TABS: 5.0000 mg | ORAL_TABLET | ORAL | 0 refills | Status: DC | PRN

## 2023-03-24 MED ORDER — AMLODIPINE BESYLATE 5 MG PO TABS: 5.0000 mg | ORAL_TABLET | Freq: Every day | ORAL | 0 refills | Status: DC
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
## 2023-03-27 ENCOUNTER — Other Ambulatory Visit (HOSPITAL_COMMUNITY): Payer: Self-pay

## 2023-03-27 ENCOUNTER — Inpatient Hospital Stay: Payer: 59

## 2023-03-27 ENCOUNTER — Ambulatory Visit: Payer: 59

## 2023-03-27 DIAGNOSIS — Z9221 Personal history of antineoplastic chemotherapy: Secondary | ICD-10-CM | POA: Insufficient documentation

## 2023-03-27 DIAGNOSIS — C61 Malignant neoplasm of prostate: Secondary | ICD-10-CM | POA: Insufficient documentation

## 2023-03-27 DIAGNOSIS — F17210 Nicotine dependence, cigarettes, uncomplicated: Secondary | ICD-10-CM | POA: Insufficient documentation

## 2023-03-27 DIAGNOSIS — Z800 Family history of malignant neoplasm of digestive organs: Secondary | ICD-10-CM | POA: Insufficient documentation

## 2023-03-27 DIAGNOSIS — I10 Essential (primary) hypertension: Secondary | ICD-10-CM | POA: Insufficient documentation

## 2023-03-27 DIAGNOSIS — R718 Other abnormality of red blood cells: Secondary | ICD-10-CM | POA: Insufficient documentation

## 2023-03-27 DIAGNOSIS — C7951 Secondary malignant neoplasm of bone: Secondary | ICD-10-CM | POA: Insufficient documentation

## 2023-03-27 DIAGNOSIS — E785 Hyperlipidemia, unspecified: Secondary | ICD-10-CM | POA: Insufficient documentation

## 2023-03-27 DIAGNOSIS — E119 Type 2 diabetes mellitus without complications: Secondary | ICD-10-CM | POA: Insufficient documentation

## 2023-03-27 MED ORDER — AMLODIPINE BESYLATE 5 MG PO TABS
5.0000 mg | ORAL_TABLET | Freq: Every day | ORAL | 0 refills | Status: DC
  Filled 2023-03-27 (×2): qty 30, 30d supply, fill #0

## 2023-03-27 MED ORDER — OXYCODONE HCL 5 MG PO TABS
5.0000 mg | ORAL_TABLET | ORAL | 0 refills | Status: DC | PRN
  Filled 2023-03-27: qty 60, 5d supply, fill #0

## 2023-03-27 NOTE — Progress Notes (Signed)
RN spoke with patient's wife and provided my direct number requesting patient to call back to review missed med onc appointment.

## 2023-03-28 ENCOUNTER — Ambulatory Visit: Payer: 59

## 2023-03-28 NOTE — Progress Notes (Signed)
RN attempted to reach out to speak with patient to address potential barriers related to missed radiation appointments.  Per wife, "I gave him your number, he said he will call."  RN left message with patient's daughter as well to help coordinate for missed appointments.

## 2023-03-29 ENCOUNTER — Ambulatory Visit: Payer: 59

## 2023-03-30 ENCOUNTER — Ambulatory Visit: Payer: 59

## 2023-03-30 ENCOUNTER — Telehealth: Payer: Self-pay

## 2023-03-30 NOTE — Progress Notes (Signed)
RN left message for call back as well to assess barriers with him missing treatment.   RN also left additional message with his daughter whom helps coordinate his appointments.

## 2023-03-30 NOTE — Telephone Encounter (Signed)
RN called patient in effort to find out why he's missing appointments.  Unable to get him on phone left message for him to return call as soon as possible.  Will attempt again later.

## 2023-03-31 ENCOUNTER — Ambulatory Visit: Payer: 59

## 2023-03-31 ENCOUNTER — Telehealth: Payer: Self-pay

## 2023-03-31 NOTE — Telephone Encounter (Signed)
RN called numbers provided to check in with patient and to see if he will be at today appointment.  No answer had to leave message to return my call.

## 2023-03-31 NOTE — Telephone Encounter (Addendum)
Radiation therapist informed me she was able to get [name] through his wife's phone and stated that reason for him not coming today was because of incontinent of stool.  RN called to address symptoms and some treatment options and neither patient or wife would answer phone sent straight to voicemail.  Left a message to come back so that symptoms could be addressed.  Several attempts have been made not sure why not answering or returning calls.

## 2023-03-31 NOTE — Telephone Encounter (Signed)
RN attempt call again call sent to voicemail left another message to return my call.

## 2023-04-03 ENCOUNTER — Telehealth: Payer: Self-pay

## 2023-04-03 ENCOUNTER — Ambulatory Visit: Payer: 59

## 2023-04-03 NOTE — Telephone Encounter (Signed)
RN called patient to check in see how he was doing and to see if he will be in for treatment this afternoon.  No answer left message.  We try again later.

## 2023-04-03 NOTE — Telephone Encounter (Signed)
RN called patient again wanting to see if patient needed anything from [place] that could be helpful in getting him in or his schedule treatment.  Wife not answering phone and patient not answering left message on both.

## 2023-04-04 ENCOUNTER — Telehealth: Payer: Self-pay

## 2023-04-04 ENCOUNTER — Ambulatory Visit: Payer: 59

## 2023-04-04 NOTE — Telephone Encounter (Signed)
RN attempting to reach Troy Mclaughlin about radiation appointment this morning.  No answer left voicemail to return call.  Will try again.  Will inform radiation therapist of call.

## 2023-04-05 ENCOUNTER — Ambulatory Visit
Admission: RE | Admit: 2023-04-05 | Discharge: 2023-04-05 | Discharge status: Home / Self Care | Payer: 59 | Source: Ambulatory Visit | Attending: Radiation Oncology

## 2023-04-05 ENCOUNTER — Other Ambulatory Visit: Payer: Self-pay

## 2023-04-05 ENCOUNTER — Ambulatory Visit: Payer: 59

## 2023-04-05 DIAGNOSIS — Z510 Encounter for antineoplastic radiation therapy: Secondary | ICD-10-CM | POA: Diagnosis not present

## 2023-04-05 LAB — RAD ONC ARIA SESSION SUMMARY
Course Elapsed Days: 16
Plan Fractions Treated to Date: 6
Plan Fractions Treated to Date: 6
Plan Prescribed Dose Per Fraction: 3 Gy
Plan Prescribed Dose Per Fraction: 3 Gy
Plan Total Fractions Prescribed: 10
Plan Total Fractions Prescribed: 10
Plan Total Prescribed Dose: 30 Gy
Plan Total Prescribed Dose: 30 Gy
Reference Point Dosage Given to Date: 18 Gy
Reference Point Dosage Given to Date: 18 Gy
Reference Point Session Dosage Given: 3 Gy
Reference Point Session Dosage Given: 3 Gy
Session Number: 6

## 2023-04-06 ENCOUNTER — Other Ambulatory Visit: Payer: Self-pay

## 2023-04-06 ENCOUNTER — Ambulatory Visit
Admission: RE | Admit: 2023-04-06 | Discharge: 2023-04-06 | Discharge status: Home / Self Care | Payer: 59 | Source: Ambulatory Visit | Attending: Radiation Oncology

## 2023-04-06 ENCOUNTER — Ambulatory Visit: Payer: 59

## 2023-04-06 DIAGNOSIS — Z510 Encounter for antineoplastic radiation therapy: Secondary | ICD-10-CM | POA: Diagnosis not present

## 2023-04-06 LAB — RAD ONC ARIA SESSION SUMMARY
Course Elapsed Days: 17
Plan Fractions Treated to Date: 7
Plan Fractions Treated to Date: 7
Plan Prescribed Dose Per Fraction: 3 Gy
Plan Prescribed Dose Per Fraction: 3 Gy
Plan Total Fractions Prescribed: 10
Plan Total Fractions Prescribed: 10
Plan Total Prescribed Dose: 30 Gy
Plan Total Prescribed Dose: 30 Gy
Reference Point Dosage Given to Date: 21 Gy
Reference Point Dosage Given to Date: 21 Gy
Reference Point Session Dosage Given: 3 Gy
Reference Point Session Dosage Given: 3 Gy
Session Number: 7

## 2023-04-07 ENCOUNTER — Ambulatory Visit
Admission: RE | Admit: 2023-04-07 | Discharge: 2023-04-07 | Disposition: A | Discharge status: Home / Self Care | Payer: 59 | Source: Ambulatory Visit | Attending: Radiation Oncology | Admitting: Radiation Oncology

## 2023-04-07 ENCOUNTER — Ambulatory Visit: Payer: 59

## 2023-04-07 ENCOUNTER — Other Ambulatory Visit: Payer: Self-pay

## 2023-04-07 DIAGNOSIS — Z510 Encounter for antineoplastic radiation therapy: Secondary | ICD-10-CM | POA: Diagnosis not present

## 2023-04-07 LAB — RAD ONC ARIA SESSION SUMMARY
Course Elapsed Days: 18
Plan Fractions Treated to Date: 8
Plan Fractions Treated to Date: 8
Plan Prescribed Dose Per Fraction: 3 Gy
Plan Prescribed Dose Per Fraction: 3 Gy
Plan Total Fractions Prescribed: 10
Plan Total Fractions Prescribed: 10
Plan Total Prescribed Dose: 30 Gy
Plan Total Prescribed Dose: 30 Gy
Reference Point Dosage Given to Date: 24 Gy
Reference Point Dosage Given to Date: 24 Gy
Reference Point Session Dosage Given: 3 Gy
Reference Point Session Dosage Given: 3 Gy
Session Number: 8
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
## 2023-04-10 ENCOUNTER — Inpatient Hospital Stay: Payer: 59

## 2023-04-10 ENCOUNTER — Ambulatory Visit
Admission: RE | Admit: 2023-04-10 | Discharge: 2023-04-10 | Disposition: A | Discharge status: Home / Self Care | Payer: 59 | Source: Ambulatory Visit | Attending: Radiation Oncology | Admitting: Radiation Oncology

## 2023-04-10 ENCOUNTER — Telehealth: Payer: Self-pay

## 2023-04-10 ENCOUNTER — Other Ambulatory Visit (HOSPITAL_COMMUNITY): Payer: Self-pay

## 2023-04-10 ENCOUNTER — Other Ambulatory Visit: Payer: Self-pay

## 2023-04-10 ENCOUNTER — Telehealth: Payer: Self-pay | Admitting: Pharmacy Technician

## 2023-04-10 ENCOUNTER — Other Ambulatory Visit: Payer: 59

## 2023-04-10 ENCOUNTER — Inpatient Hospital Stay (HOSPITAL_BASED_OUTPATIENT_CLINIC_OR_DEPARTMENT_OTHER): Payer: 59

## 2023-04-10 VITALS — BP 225/115 | HR 70 | Temp 97.7°F | Resp 14 | Wt 134.1 lb

## 2023-04-10 DIAGNOSIS — E119 Type 2 diabetes mellitus without complications: Secondary | ICD-10-CM

## 2023-04-10 DIAGNOSIS — C61 Malignant neoplasm of prostate: Secondary | ICD-10-CM | POA: Diagnosis not present

## 2023-04-10 DIAGNOSIS — I10 Essential (primary) hypertension: Secondary | ICD-10-CM

## 2023-04-10 DIAGNOSIS — E785 Hyperlipidemia, unspecified: Secondary | ICD-10-CM

## 2023-04-10 DIAGNOSIS — K2100 Gastro-esophageal reflux disease with esophagitis, without bleeding: Secondary | ICD-10-CM

## 2023-04-10 DIAGNOSIS — C7951 Secondary malignant neoplasm of bone: Secondary | ICD-10-CM

## 2023-04-10 DIAGNOSIS — D509 Iron deficiency anemia, unspecified: Secondary | ICD-10-CM

## 2023-04-10 DIAGNOSIS — D72819 Decreased white blood cell count, unspecified: Secondary | ICD-10-CM

## 2023-04-10 DIAGNOSIS — I2510 Atherosclerotic heart disease of native coronary artery without angina pectoris: Secondary | ICD-10-CM

## 2023-04-10 DIAGNOSIS — D72810 Lymphocytopenia: Secondary | ICD-10-CM

## 2023-04-10 DIAGNOSIS — Z510 Encounter for antineoplastic radiation therapy: Secondary | ICD-10-CM | POA: Diagnosis not present

## 2023-04-10 DIAGNOSIS — R718 Other abnormality of red blood cells: Secondary | ICD-10-CM

## 2023-04-10 LAB — RAD ONC ARIA SESSION SUMMARY
Course Elapsed Days: 21
Plan Fractions Treated to Date: 9
Plan Fractions Treated to Date: 9
Plan Prescribed Dose Per Fraction: 3 Gy
Plan Prescribed Dose Per Fraction: 3 Gy
Plan Total Fractions Prescribed: 10
Plan Total Fractions Prescribed: 10
Plan Total Prescribed Dose: 30 Gy
Plan Total Prescribed Dose: 30 Gy
Reference Point Dosage Given to Date: 27 Gy
Reference Point Dosage Given to Date: 27 Gy
Reference Point Session Dosage Given: 3 Gy
Reference Point Session Dosage Given: 3 Gy
Session Number: 9

## 2023-04-10 MED ORDER — CARVEDILOL 6.25 MG PO TABS
6.2500 mg | ORAL_TABLET | Freq: Two times a day (BID) | ORAL | 0 refills | Status: DC
  Filled 2023-04-10: qty 180, 90d supply, fill #0

## 2023-04-10 MED ORDER — CLONIDINE HCL 0.1 MG PO TABS
0.2000 mg | ORAL_TABLET | Freq: Once | ORAL | Status: AC
  Administered 2023-04-10: 0.2 mg via ORAL
  Filled 2023-04-10: qty 2

## 2023-04-10 MED ORDER — DAROLUTAMIDE 300 MG PO TABS: 600.0000 mg | ORAL_TABLET | Freq: Two times a day (BID) | ORAL | 11 refills | Status: DC

## 2023-04-10 MED ORDER — AMLODIPINE BESYLATE 10 MG PO TABS
10.0000 mg | ORAL_TABLET | Freq: Every morning | ORAL | 0 refills | Status: DC
  Filled 2023-04-10: qty 90, 90d supply, fill #0

## 2023-04-10 NOTE — Assessment & Plan Note (Addendum)
Missing about 2 days a week and was taking 5 mg only. Will increase dose of amlodipine refills. 10 mg daily Will refill Coreg 6.25 mg twice daily Patient was given a dose of clonidine in clinic and advised to pick up new medication and resume asap.

## 2023-04-10 NOTE — Assessment & Plan Note (Addendum)
Will start darolutamide Will complete radiation tomorrow. CBC, CMP, PSA, testosterone Genetic testing Somatic testing

## 2023-04-10 NOTE — Assessment & Plan Note (Signed)
Has not seen PCP for management. Check A1c

## 2023-04-10 NOTE — Assessment & Plan Note (Signed)
Lipid panel 

## 2023-04-10 NOTE — Assessment & Plan Note (Signed)
B12, folate, hepatitis panel and HIV

## 2023-04-10 NOTE — Assessment & Plan Note (Signed)
Check ferritin 

## 2023-04-10 NOTE — Telephone Encounter (Signed)
Oral Oncology Patient Advocate Encounter   Received notification that prior authorization for Nubeqa is required.   PA submitted on 04/10/23 Key [id number] Status is pending     Jinger Neighbors, CPhT-Adv Oncology Pharmacy Patient Advocate [facility] Direct Number: [phone]  Fax: [phone]

## 2023-04-11 ENCOUNTER — Other Ambulatory Visit: Payer: Self-pay | Admitting: *Deleted

## 2023-04-11 ENCOUNTER — Ambulatory Visit
Admission: RE | Admit: 2023-04-11 | Discharge: 2023-04-11 | Disposition: A | Discharge status: Home / Self Care | Payer: 59 | Source: Ambulatory Visit | Attending: Radiation Oncology | Admitting: Radiation Oncology

## 2023-04-11 ENCOUNTER — Inpatient Hospital Stay: Payer: 59

## 2023-04-11 ENCOUNTER — Other Ambulatory Visit (HOSPITAL_COMMUNITY): Payer: Self-pay

## 2023-04-11 ENCOUNTER — Other Ambulatory Visit: Payer: Self-pay

## 2023-04-11 ENCOUNTER — Telehealth: Payer: Self-pay

## 2023-04-11 ENCOUNTER — Telehealth: Payer: Self-pay | Admitting: *Deleted

## 2023-04-11 DIAGNOSIS — D72819 Decreased white blood cell count, unspecified: Secondary | ICD-10-CM

## 2023-04-11 DIAGNOSIS — D72810 Lymphocytopenia: Secondary | ICD-10-CM

## 2023-04-11 DIAGNOSIS — C7951 Secondary malignant neoplasm of bone: Secondary | ICD-10-CM

## 2023-04-11 DIAGNOSIS — E785 Hyperlipidemia, unspecified: Secondary | ICD-10-CM

## 2023-04-11 DIAGNOSIS — E119 Type 2 diabetes mellitus without complications: Secondary | ICD-10-CM

## 2023-04-11 DIAGNOSIS — C61 Malignant neoplasm of prostate: Secondary | ICD-10-CM

## 2023-04-11 DIAGNOSIS — Z1379 Encounter for other screening for genetic and chromosomal anomalies: Secondary | ICD-10-CM

## 2023-04-11 DIAGNOSIS — Z510 Encounter for antineoplastic radiation therapy: Secondary | ICD-10-CM | POA: Diagnosis not present

## 2023-04-11 DIAGNOSIS — D509 Iron deficiency anemia, unspecified: Secondary | ICD-10-CM

## 2023-04-11 LAB — FOLATE: Folate: 12.5 ng/mL (ref >5.9)

## 2023-04-11 LAB — HEPATITIS PANEL, ACUTE
HCV Ab: NONREACTIVE
Hep A IgM: NONREACTIVE
Hep B C IgM: NONREACTIVE
Hepatitis B Surface Ag: NONREACTIVE

## 2023-04-11 LAB — RAD ONC ARIA SESSION SUMMARY
Course Elapsed Days: 22
Plan Fractions Treated to Date: 10
Plan Fractions Treated to Date: 10
Plan Prescribed Dose Per Fraction: 3 Gy
Plan Prescribed Dose Per Fraction: 3 Gy
Plan Total Fractions Prescribed: 10
Plan Total Fractions Prescribed: 10
Plan Total Prescribed Dose: 30 Gy
Plan Total Prescribed Dose: 30 Gy
Reference Point Dosage Given to Date: 30 Gy
Reference Point Dosage Given to Date: 30 Gy
Reference Point Session Dosage Given: 3 Gy
Reference Point Session Dosage Given: 3 Gy
Session Number: 10

## 2023-04-11 LAB — CBC WITH DIFFERENTIAL (CANCER CENTER ONLY)
Abs Immature Granulocytes: 0.01 10*3/uL (ref 0.00–0.07)
Basophils Absolute: 0 10*3/uL (ref 0.0–0.1)
Basophils Relative: 1 %
Eosinophils Absolute: 0.1 10*3/uL (ref 0.0–0.5)
Eosinophils Relative: 6 %
HCT: 33.3 % — ABNORMAL LOW (ref 39.0–52.0)
Hemoglobin: 11.7 g/dL — ABNORMAL LOW (ref 13.0–17.0)
Immature Granulocytes: 1 %
Lymphocytes Relative: 14 %
Lymphs Abs: 0.3 10*3/uL — ABNORMAL LOW (ref 0.7–4.0)
MCH: 26.5 pg (ref 26.0–34.0)
MCHC: 35.1 g/dL (ref 30.0–36.0)
MCV: 75.3 fL — ABNORMAL LOW (ref 80.0–100.0)
Monocytes Absolute: 0.2 10*3/uL (ref 0.1–1.0)
Monocytes Relative: 12 %
Neutro Abs: 1.4 10*3/uL — ABNORMAL LOW (ref 1.7–7.7)
Neutrophils Relative %: 66 %
Platelet Count: 138 10*3/uL — ABNORMAL LOW (ref 150–400)
RBC: 4.42 MIL/uL (ref 4.22–5.81)
RDW: 16.1 % — ABNORMAL HIGH (ref 11.5–15.5)
Smear Review: NORMAL
WBC Count: 2.1 10*3/uL — ABNORMAL LOW (ref 4.0–10.5)
nRBC: 0 % (ref 0.0–0.2)

## 2023-04-11 LAB — LIPID PANEL
Cholesterol: 191 mg/dL (ref 0–200)
HDL: 71 mg/dL (ref >40)
LDL Cholesterol: 108 mg/dL — ABNORMAL HIGH (ref 0–99)
Total CHOL/HDL Ratio: 2.7 {ratio}
Triglycerides: 60 mg/dL (ref <150)
VLDL: 12 mg/dL (ref 0–40)

## 2023-04-11 LAB — CMP (CANCER CENTER ONLY)
ALT: 8 U/L (ref 0–44)
AST: 15 U/L (ref 15–41)
Albumin: 3.9 g/dL (ref 3.5–5.0)
Alkaline Phosphatase: 177 U/L — ABNORMAL HIGH (ref 38–126)
Anion gap: 6 (ref 5–15)
BUN: 9 mg/dL (ref 8–23)
CO2: 27 mmol/L (ref 22–32)
Calcium: 9.1 mg/dL (ref 8.9–10.3)
Chloride: 102 mmol/L (ref 98–111)
Creatinine: 0.88 mg/dL (ref 0.61–1.24)
GFR, Estimated: >60 mL/min (ref >60)
Glucose, Bld: 91 mg/dL (ref 70–99)
Potassium: 3.3 mmol/L — ABNORMAL LOW (ref 3.5–5.1)
Sodium: 135 mmol/L (ref 135–145)
Total Bilirubin: 0.4 mg/dL (ref <1.2)
Total Protein: 7 g/dL (ref 6.5–8.1)

## 2023-04-11 LAB — MISCELLANEOUS TEST

## 2023-04-11 LAB — HIV ANTIBODY (ROUTINE TESTING W REFLEX): HIV Screen 4th Generation wRfx: NONREACTIVE

## 2023-04-11 LAB — VITAMIN B12: Vitamin B-12: 168 pg/mL — ABNORMAL LOW (ref 180–914)

## 2023-04-11 LAB — HEMOGLOBIN A1C
Hgb A1c MFr Bld: 5.8 % — ABNORMAL HIGH (ref 4.8–5.6)
Mean Plasma Glucose: 119.76 mg/dL

## 2023-04-11 LAB — FERRITIN: Ferritin: 114 ng/mL (ref 24–336)

## 2023-04-11 LAB — GENETIC SCREENING ORDER

## 2023-04-11 MED ORDER — OXYCODONE HCL 5 MG PO TABS
5.0000 mg | ORAL_TABLET | Freq: Four times a day (QID) | ORAL | 0 refills | Status: DC | PRN
  Filled 2023-04-11: qty 60, 8d supply, fill #0

## 2023-04-11 MED ORDER — OXYCODONE HCL 5 MG PO TABS: 5.0000 mg | ORAL_TABLET | Freq: Four times a day (QID) | ORAL | 0 refills | Status: DC | PRN

## 2023-04-11 NOTE — Telephone Encounter (Signed)
Oral Oncology Pharmacist Encounter  Received new prescription for Nubeqa (Darolutamide) for the treatment of metastatic castration resistant prostate cancer, planned duration until disease progression or unacceptable toxicity.  Labs from 04/11/2023 assessed: CMP WNL besides K 3.3 mmol/L. CBC: WBC 2.1 K/uL, ANC 1.4 K/uL, Hgb 11.7 g/dL, and platelets 865 K/uL. Prescription dose and frequency assessed for appropriateness.  Zytiga (Abiraterone) would be an appropriate recommendation for the patient due to their disease state. However due to the patient's elevated blood pressure of 225/115 on 04/10/23 and the patient's low potassium of 3.3 from 04/11/23, Zytiga would not appropriate therapy.  Current medication list in Epic reviewed, DDIs with Nubeqa identified: - Category C DDI with Nubeqa and Atorvastatin. Nubeqa may increase the serum concentration of Atorvastatin. Will counsel patient to look out for simvastatin toxicities (muscle pain).   Evaluated chart and no patient barriers to medication adherence noted.   Patient agreement for treatment documented in MD note on 04/10/23.  Prescription has been e-scribed to the [facility] for benefits analysis and approval.  Oral Oncology Clinic will continue to follow for insurance authorization, copayment issues, initial counseling and start date.  Francetta Found, PharmD Candidate Class of 2025  04/11/2023 10:43 AM Oral Oncology Clinic [phone]

## 2023-04-11 NOTE — Telephone Encounter (Signed)
[facility] Clinical Social Work  Clinical Social Work was referred by nurse for assessment of psychosocial needs.  Clinical Social Worker attempted to contact patient by phone to offer support and assess for needs.  No Answer. CSW left VM with direct information.   Marguerita Merles, LCSW  Clinical Social Worker [facility]

## 2023-04-11 NOTE — Telephone Encounter (Signed)
Oral Oncology Patient Advocate Encounter  Received notification that the request for prior authorization for Nubeqa has been denied due to "medication is not being used for a "medically accepted indication" per Medicare Part D guidelines.     An urgent appeal for the prior authorization denial of Troy Mclaughlin has been started by the pharmacist on 04/11/23.   This encounter will continue to be updated until final appeal determination.    Jinger Neighbors, CPhT-Adv Oncology Pharmacy Patient Advocate [facility] Direct Number: [phone]  Fax: [phone]

## 2023-04-11 NOTE — Telephone Encounter (Signed)
Checked patient's BP today after lab appt. 170/96  Pt was unable to stay yesterday after clonidine for BP recheck.   Pt states he needs a refill of oxycodone sent to [facility]. Dr Cherly Hensen did a refill

## 2023-04-11 NOTE — Progress Notes (Signed)
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    RN reviewed recommendations from MD regarding referral for PCP, in process.  Patient was able to provide me his direct contact number and this has been updated.  Verbalized concerns with outcome related to his disease.  Education and therapeutic communication provided.   Barrier related to housing, housing resource information provided and will be followed up by LCSW.  Genetic testing obtained today.  Patient voiced concerns with ED, I encouraged him to reach out to urology regarding this.    Will continue to follow and assess needs.

## 2023-04-11 NOTE — Progress Notes (Signed)
Oxycodone refilled to [facility] pharmacy

## 2023-04-12 ENCOUNTER — Telehealth: Payer: Self-pay | Admitting: *Deleted

## 2023-04-12 ENCOUNTER — Other Ambulatory Visit (HOSPITAL_COMMUNITY): Payer: Self-pay

## 2023-04-12 ENCOUNTER — Telehealth: Payer: Self-pay | Admitting: Pharmacy Technician

## 2023-04-12 ENCOUNTER — Encounter: Payer: Self-pay | Admitting: Genetic Counselor

## 2023-04-12 ENCOUNTER — Telehealth: Payer: Self-pay

## 2023-04-12 LAB — PSA, TOTAL AND FREE
PSA, Free Pct: 13.4 %
PSA, Free: 4.66 ng/mL
Prostate Specific Ag, Serum: 34.8 ng/mL — ABNORMAL HIGH (ref 0.0–4.0)

## 2023-04-12 LAB — TESTOSTERONE: Testosterone: 6 ng/dL — ABNORMAL LOW (ref 264–916)

## 2023-04-12 NOTE — Telephone Encounter (Signed)
[facility] Clinical Social Work  Clinical Social Work was referred by nurse for assessment of psychosocial needs.  Clinical Social Worker attempted to contact patient by phone to offer support and assess for needs x2.  CSW left VM stating purpose of call and requesting a call back if needed. CSW relayed message from patient's nurse about price of B12 tablets. Referral source has been notified. No follow up needed at this time.  Marguerita Merles, LCSW Clinical Social Worker [facility]

## 2023-04-12 NOTE — Telephone Encounter (Signed)
LM with note below 

## 2023-04-12 NOTE — Telephone Encounter (Signed)
 Oral Oncology Patient Advocate Encounter   Submitted application for assistance for Nubeqa to [facility].   Application submitted via e-fax to [phone]   [employer] phone number [phone].   I will continue to check the status until final determination.   Jinger Neighbors, CPhT-Adv Oncology Pharmacy Patient Advocate [facility] Direct Number: [phone]  Fax: [phone]

## 2023-04-12 NOTE — Telephone Encounter (Signed)
-----   Message from Melven Sartorius sent at 04/12/2023  8:24 AM EST ----- Hi Claudell Rhody please have him start b12 1000 mcg daily for b12 deficiency. Thanks.

## 2023-04-12 NOTE — Radiation Completion Notes (Signed)
Patient Name: SYNCERE, KATZENSTEIN MRN: [id number] Date of Birth: [birth year] Referring Physician: Jettie Pagan, M.D. Date of Service: 2023-04-12 Radiation Oncologist: Margaretmary Bayley, M.D. [facility]                             RADIATION ONCOLOGY END OF TREATMENT NOTE     Diagnosis: C61 Malignant neoplasm of prostate Staging on 2020-10-27: Malignant neoplasm of prostate (HCC) T=cT2a, N=cN0, M=cM0 Intent: Palliative     ==========DELIVERED PLANS==========  First Treatment Date: 2023-03-20 - Last Treatment Date: 2023-04-11   Plan Name: Pelvis_L Site: Hip, Left Technique: 3D Mode: Photon Dose Per Fraction: 3 Gy Prescribed Dose (Delivered / Prescribed): 30 Gy / 30 Gy Prescribed Fxs (Delivered / Prescribed): 10 / 10   Plan Name: Chest_L Site: Ribs, Left Technique: 3D Mode: Photon Dose Per Fraction: 3 Gy Prescribed Dose (Delivered / Prescribed): 30 Gy / 30 Gy Prescribed Fxs (Delivered / Prescribed): 10 / 10     ==========ON TREATMENT VISIT DATES========== 2023-03-24, 2023-04-07     ==========UPCOMING VISITS==========       ==========APPENDIX - ON TREATMENT VISIT NOTES==========   See weekly On Treatment Notes in Epic for details.

## 2023-04-13 ENCOUNTER — Other Ambulatory Visit (HOSPITAL_COMMUNITY): Payer: Self-pay

## 2023-04-14 ENCOUNTER — Other Ambulatory Visit (HOSPITAL_COMMUNITY): Payer: Self-pay

## 2023-04-14 NOTE — Telephone Encounter (Signed)
Oral Oncology Patient Advocate Encounter  Received notification that the request for prior authorization for Nubeqa has been denied due to medication is not FDA indicated.     Jinger Neighbors, CPhT-Adv Oncology Pharmacy Patient Advocate [facility] Direct Number: [phone]  Fax: [phone]

## 2023-04-14 NOTE — Telephone Encounter (Signed)
Oral Oncology Patient Advocate Encounter  Called to check status of PAP application with [employer]. Application is going through benefits review. Representative stated it could take up 5 business days.   Jinger Neighbors, CPhT-Adv Oncology Pharmacy Patient Advocate [facility] Direct Number: [phone]  Fax: [phone]

## 2023-04-18 NOTE — Progress Notes (Signed)
RN spoke with patient and was able to inform him that pharmacy and LCSW were trying to reach him. Patient was unable to write down numbers, so I will have staff reach out to him at number listed.   RN encouraged patient to answer calls from [phone] number's.  Patient continues with nausea and diarrhea s/p radiation, however, does not have financial means to pick up medications to control.   RN will review resources with pharmacy and LCSW to see if any new grants/funding's or programs are available.

## 2023-04-18 NOTE — Telephone Encounter (Signed)
Oral Oncology Patient Advocate Encounter   Received notification that the application for assistance for Nubeqa through [employer] has been approved.   [employer] phone number [phone].   Effective dates: 04/18/23 through 05/23/23  Medication will be filled at [facility] [place], [place]  I have left a vm for patient to call me back to discuss approval and renewal requirements for 2025  Jinger Neighbors, CPhT-Adv Oncology Pharmacy Patient Advocate [facility] Direct Number: [phone]  Fax: [phone]

## 2023-04-18 NOTE — Telephone Encounter (Signed)
Voicemail left today regarding new start Nubeqa counseling. Patient instructed to call back for further information.   Jerry Caras, PharmD PGY2 Oncology Pharmacy Resident   04/18/2023 11:41 AM

## 2023-04-19 NOTE — Telephone Encounter (Signed)
Oral Oncology Patient Advocate Encounter  Spoke with patient regarding renewal process and setting up first fill of medication. Patient had questions about how this medication will benefit him. I advised it is help treat his cancer but that he should speak with the care team for more information.  Patient also voiced concerns over pain management, I advised him to speak with the nursing staff in regards to his concerns.  Patient to stop by office some time week of 12/2-12/6 to sign forms.  Jinger Neighbors, CPhT-Adv Oncology Pharmacy Patient Advocate [facility] Direct Number: [phone]  Fax: [phone]

## 2023-04-24 ENCOUNTER — Telehealth: Payer: Self-pay

## 2023-04-24 NOTE — Telephone Encounter (Signed)
[facility] Clinical Social Work  Clinical Social Work was referred by nurse for assessment of psychosocial needs.  Clinical Social Worker attempted to contact patient by phone to offer support and assess for needs.   CSW was unable to reach patient. Left VM with direct contact. No follow up at this time.  Marguerita Merles, LCSWA Clinical Social Worker [facility]

## 2023-04-26 ENCOUNTER — Telehealth: Payer: Self-pay

## 2023-04-26 NOTE — Telephone Encounter (Signed)
[facility] Clinical Social Work  Clinical Social Work was referred by Statistician for assessment of psychosocial needs.  Clinical Social Worker attempted to contact patient by phone to offer support and assess for needs.  No Answer. CSW unable to leave vm due to mailbox full. Referral source notified.   Marguerita Merles, LCSWA Clinical Social Worker [facility]

## 2023-04-27 ENCOUNTER — Other Ambulatory Visit (HOSPITAL_COMMUNITY): Payer: Self-pay

## 2023-04-27 ENCOUNTER — Other Ambulatory Visit: Payer: Self-pay | Admitting: Radiation Oncology

## 2023-04-27 MED ORDER — OXYCODONE HCL 5 MG PO TABS
5.0000 mg | ORAL_TABLET | ORAL | 0 refills | Status: DC | PRN
  Filled 2023-04-27: qty 45, 4d supply, fill #0

## 2023-04-28 NOTE — Progress Notes (Signed)
RN and several staff members from pharmacy, LCSW have attempted to make contact with patient to provided support and treatment education with no success.   RN attempted again today, no answer.  Voicemail box full.  RN will continue to try.

## 2023-04-29 ENCOUNTER — Other Ambulatory Visit (HOSPITAL_COMMUNITY): Payer: Self-pay

## 2023-05-03 ENCOUNTER — Encounter: Payer: Self-pay | Admitting: Genetic Counselor

## 2023-05-03 DIAGNOSIS — Z1379 Encounter for other screening for genetic and chromosomal anomalies: Secondary | ICD-10-CM | POA: Insufficient documentation

## 2023-05-04 NOTE — Progress Notes (Signed)
Several attempts to contact patient to address barriers and ensure he is on track with treatment recommendations with no success.  Patient's voicemail remains full, additional message left with daughter and spouse requesting call back.  Will mail out letter with recommendations to contact our office.

## 2023-05-04 NOTE — Telephone Encounter (Signed)
Oral Oncology Pharmacist Encounter  Patient has not answered the phone to set up delivery from patient assistance program of the Nubeqa. Patient has not answered any phone calls from pharmacy to instruct on delivery or to sign for next years assistance. Patient has missed deadline for re enrollment. Additionally, patient has not answered for education. MD notified. Oncology pharmacy clinic will sign off at this time.   Bethel Born, PharmD Hematology/Oncology Clinical Pharmacist [facility] [phone]

## 2023-05-05 NOTE — Progress Notes (Signed)
RN was able to speak with patient and he stated that he received a medication from [employer].  Patient was not at home at time of call and could not verify medication name.  RN provided patient with pharmacy number to call once he was back home today.   RN scheduled follow up's with MD and labs with anticipation of him starting darolutamide in the near future.  Pt aware of appointments and will mail copy to confirmed address.   RN followed up with PCP referral, currently still pending review.  Staff will contact me directly with additional information once obtained.

## 2023-05-09 ENCOUNTER — Ambulatory Visit
Admission: RE | Admit: 2023-05-09 | Discharge: 2023-05-09 | Disposition: A | Discharge status: Home / Self Care | Payer: 59 | Source: Ambulatory Visit | Attending: Radiation Oncology | Admitting: Radiation Oncology

## 2023-05-09 DIAGNOSIS — Z510 Encounter for antineoplastic radiation therapy: Secondary | ICD-10-CM | POA: Insufficient documentation

## 2023-05-09 DIAGNOSIS — C61 Malignant neoplasm of prostate: Secondary | ICD-10-CM | POA: Insufficient documentation

## 2023-05-09 DIAGNOSIS — E119 Type 2 diabetes mellitus without complications: Secondary | ICD-10-CM | POA: Insufficient documentation

## 2023-05-09 DIAGNOSIS — C7951 Secondary malignant neoplasm of bone: Secondary | ICD-10-CM | POA: Insufficient documentation

## 2023-05-09 DIAGNOSIS — D72810 Lymphocytopenia: Secondary | ICD-10-CM | POA: Insufficient documentation

## 2023-05-09 DIAGNOSIS — E538 Deficiency of other specified B group vitamins: Secondary | ICD-10-CM | POA: Insufficient documentation

## 2023-05-09 DIAGNOSIS — E785 Hyperlipidemia, unspecified: Secondary | ICD-10-CM | POA: Insufficient documentation

## 2023-05-09 NOTE — Progress Notes (Signed)
Patient did not contact pharmacy to confirm medication and receive additional education.   RN left message for call back and will continue to try to make attempts to contact.

## 2023-05-09 NOTE — Progress Notes (Signed)
  Radiation Oncology         [phone] ________________________________  Name: Troy Mclaughlin MRN: [id number]  Date of Service: 05/09/2023  DOB: 01/24/1956  Post Treatment Telephone Note  Diagnosis:  C61 Malignant neoplasm of prostate (as documented in provider EOT note)  The patient was not available for call today. Voicemail left.  The patient is not scheduled for ongoing care with Dr. Cherly Hensen  in medical oncology. The patient was encouraged to call if he develops concerns or questions regarding radiation.    Ruel Favors, LPN

## 2023-05-10 ENCOUNTER — Telehealth: Payer: Self-pay | Admitting: *Deleted

## 2023-05-10 NOTE — Telephone Encounter (Signed)
Copied from CRM [id number]. Topic: Referral - Status >> May 05, 2023  3:22 PM Epimenio Foot F wrote: Reason for CRM: [name] at the [facility] is calling in to check the status of the referral that was placed on 04/10/23. [name] says that pt is hard to get in contact with and that the office can call her to schedule the appointment since pt will be in their office soon.

## 2023-05-11 NOTE — Telephone Encounter (Addendum)
[name], I had an appointment for tomorrow but that may have been too short of notice. He has been scheduled a new patient appointment on June 06, 2022 at 0920 with Patient [facility], [address] [place], [place], [place] [address]. Their phone number is [phone].

## 2023-05-15 NOTE — Progress Notes (Signed)
RN attempted to contact patient to inform of upcoming PCP appointment to establish care and review need for education on medication, darolutamide.   No answer, voicemail box is now full.  Will continue to attempt.

## 2023-05-19 ENCOUNTER — Other Ambulatory Visit: Payer: Self-pay | Admitting: Hematology & Oncology

## 2023-05-19 ENCOUNTER — Other Ambulatory Visit (HOSPITAL_COMMUNITY): Payer: Self-pay

## 2023-05-19 ENCOUNTER — Other Ambulatory Visit: Payer: Self-pay

## 2023-05-19 ENCOUNTER — Telehealth: Payer: Self-pay

## 2023-05-19 ENCOUNTER — Other Ambulatory Visit: Payer: Self-pay | Admitting: Radiation Oncology

## 2023-05-19 MED ORDER — TRAZODONE HCL 50 MG PO TABS
50.0000 mg | ORAL_TABLET | Freq: Every evening | ORAL | 11 refills | Status: DC | PRN
  Filled 2023-05-19: qty 30, 30d supply, fill #0

## 2023-05-19 MED ORDER — CARVEDILOL 12.5 MG PO TABS
12.5000 mg | ORAL_TABLET | Freq: Two times a day (BID) | ORAL | 3 refills | Status: DC
  Filled 2023-05-19: qty 180, 90d supply, fill #0

## 2023-05-19 MED ORDER — NITROGLYCERIN 0.4 MG SL SUBL
SUBLINGUAL_TABLET | SUBLINGUAL | 5 refills | Status: DC
  Filled 2023-05-19: qty 25, 7d supply, fill #0

## 2023-05-19 MED ORDER — OXYCODONE HCL 5 MG PO TABS: 5.0000 mg | ORAL_TABLET | ORAL | 0 refills | Status: DC | PRN

## 2023-05-19 MED ORDER — AMLODIPINE BESYLATE 5 MG PO TABS
5.0000 mg | ORAL_TABLET | Freq: Every day | ORAL | 0 refills | Status: DC
  Filled 2023-05-19: qty 90, 90d supply, fill #0

## 2023-05-19 MED ORDER — SILDENAFIL CITRATE 100 MG PO TABS
ORAL_TABLET | ORAL | 4 refills | Status: DC
  Filled 2023-05-19: qty 6, 30d supply, fill #0

## 2023-05-19 MED ORDER — ONDANSETRON HCL 4 MG PO TABS
4.0000 mg | ORAL_TABLET | Freq: Three times a day (TID) | ORAL | 2 refills | Status: DC | PRN
  Filled 2023-05-19: qty 30, 10d supply, fill #0
  Filled 2023-08-04: qty 30, 10d supply, fill #1

## 2023-05-19 MED ORDER — OXYCODONE HCL 5 MG PO TABS
5.0000 mg | ORAL_TABLET | ORAL | 0 refills | Status: DC | PRN
  Filled 2023-05-19: qty 45, 4d supply, fill #0

## 2023-05-19 MED ORDER — POTASSIUM CHLORIDE ER 20 MEQ PO TBCR
20.0000 meq | EXTENDED_RELEASE_TABLET | Freq: Two times a day (BID) | ORAL | 0 refills | Status: AC
  Filled 2023-05-19: qty 8, 4d supply, fill #0

## 2023-05-19 NOTE — Progress Notes (Signed)
Oxycodone resent to [facility] per patient request.

## 2023-05-19 NOTE — Progress Notes (Signed)
RN spoke with patient and confirmed he has not started [place]  Patient voiced he has been wanting to speak with pharmacist prior to start to be aware of side effects.  Multiple attempts have been made by myself, pharmacy, and LCSW.   RN confirmed upcoming appointment on Monday 12/30.  RN will communicate with pharmacy and LCSW to see if they are available to educate and evaluate for SDOH needs in person during appointment.  RN will provide appointment information for PCP at this time as well.  RN encouraged patient to contact transportation coordinator to get transportation set up for upcoming appointment as well since he is established with transportation system.

## 2023-05-19 NOTE — Telephone Encounter (Signed)
TC from pt, requesting refill of Oxycodone (5 mg IR). He is also wondering if Dr. Cherly Hensen could call in an Rx for cough medication and a cream for knee pain. Pt reports hx of arthritis/knee pain--advised that there are OTC creams, including Voltaren, for this that he can try. Inquired about cough, and pt states he has had this for approx one month--dry cough, afebrile. He prefers Rx versus OTC meds d/t insurance coverage. Advised to try OTC cough medication, and if cough continues and/or becomes productive (and/or he becomes febrile), he should see PCP or visit UC; however, he states he plans to discuss this w/ Dr. Cherly Hensen at his OV on Monday.   Consulted Dr. Myna Hidalgo (on-call MD) regarding Oxycodone refill request. He states he will submit Rx to pharmacy on file--[facility] [address]. TC to pt to inform of this, and he states that he prefers that Rx be submitted to [facility]. Advised to call this pharmacy and have Rx transferred over. He verbalizes understanding.

## 2023-05-20 ENCOUNTER — Other Ambulatory Visit (HOSPITAL_COMMUNITY): Payer: Self-pay

## 2023-05-21 DIAGNOSIS — D519 Vitamin B12 deficiency anemia, unspecified: Secondary | ICD-10-CM | POA: Insufficient documentation

## 2023-05-21 NOTE — Assessment & Plan Note (Signed)
Had recommended start darolutamide.  He will start today. CBC, CMP, PSA, testosterone monthly
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
## 2023-05-21 NOTE — Assessment & Plan Note (Signed)
Check IF and AP antibodies B12 1000 mcg daily

## 2023-05-22 ENCOUNTER — Inpatient Hospital Stay: Payer: 59 | Attending: Radiation Oncology

## 2023-05-22 ENCOUNTER — Inpatient Hospital Stay: Payer: 59

## 2023-05-22 ENCOUNTER — Other Ambulatory Visit (HOSPITAL_COMMUNITY): Payer: Self-pay

## 2023-05-22 ENCOUNTER — Other Ambulatory Visit: Payer: Self-pay

## 2023-05-22 VITALS — BP 171/95 | HR 75 | Temp 97.9°F | Resp 16 | Wt 138.7 lb

## 2023-05-22 DIAGNOSIS — G893 Neoplasm related pain (acute) (chronic): Secondary | ICD-10-CM | POA: Insufficient documentation

## 2023-05-22 DIAGNOSIS — D518 Other vitamin B12 deficiency anemias: Secondary | ICD-10-CM

## 2023-05-22 DIAGNOSIS — E538 Deficiency of other specified B group vitamins: Secondary | ICD-10-CM

## 2023-05-22 DIAGNOSIS — C7951 Secondary malignant neoplasm of bone: Secondary | ICD-10-CM | POA: Insufficient documentation

## 2023-05-22 DIAGNOSIS — Z923 Personal history of irradiation: Secondary | ICD-10-CM | POA: Insufficient documentation

## 2023-05-22 DIAGNOSIS — I10 Essential (primary) hypertension: Secondary | ICD-10-CM | POA: Diagnosis not present

## 2023-05-22 DIAGNOSIS — D519 Vitamin B12 deficiency anemia, unspecified: Secondary | ICD-10-CM | POA: Insufficient documentation

## 2023-05-22 DIAGNOSIS — E785 Hyperlipidemia, unspecified: Secondary | ICD-10-CM | POA: Diagnosis not present

## 2023-05-22 DIAGNOSIS — C61 Malignant neoplasm of prostate: Secondary | ICD-10-CM | POA: Diagnosis present

## 2023-05-22 DIAGNOSIS — E119 Type 2 diabetes mellitus without complications: Secondary | ICD-10-CM | POA: Insufficient documentation

## 2023-05-22 LAB — CBC WITH DIFFERENTIAL (CANCER CENTER ONLY)
Abs Immature Granulocytes: 0.01 10*3/uL (ref 0.00–0.07)
Basophils Absolute: 0 10*3/uL (ref 0.0–0.1)
Basophils Relative: 1 %
Eosinophils Absolute: 0.1 10*3/uL (ref 0.0–0.5)
Eosinophils Relative: 4 %
HCT: 37.6 % — ABNORMAL LOW (ref 39.0–52.0)
Hemoglobin: 12.9 g/dL — ABNORMAL LOW (ref 13.0–17.0)
Immature Granulocytes: 0 %
Lymphocytes Relative: 12 %
Lymphs Abs: 0.3 10*3/uL — ABNORMAL LOW (ref 0.7–4.0)
MCH: 26.7 pg (ref 26.0–34.0)
MCHC: 34.3 g/dL (ref 30.0–36.0)
MCV: 77.7 fL — ABNORMAL LOW (ref 80.0–100.0)
Monocytes Absolute: 0.6 10*3/uL (ref 0.1–1.0)
Monocytes Relative: 20 %
Neutro Abs: 1.8 10*3/uL (ref 1.7–7.7)
Neutrophils Relative %: 63 %
Platelet Count: 260 10*3/uL (ref 150–400)
RBC: 4.84 MIL/uL (ref 4.22–5.81)
RDW: 15.9 % — ABNORMAL HIGH (ref 11.5–15.5)
WBC Count: 2.8 10*3/uL — ABNORMAL LOW (ref 4.0–10.5)
nRBC: 0 % (ref 0.0–0.2)

## 2023-05-22 LAB — CMP (CANCER CENTER ONLY)
ALT: 8 U/L (ref 0–44)
AST: 17 U/L (ref 15–41)
Albumin: 3.9 g/dL (ref 3.5–5.0)
Alkaline Phosphatase: 191 U/L — ABNORMAL HIGH (ref 38–126)
Anion gap: 6 (ref 5–15)
BUN: 9 mg/dL (ref 8–23)
CO2: 28 mmol/L (ref 22–32)
Calcium: 9.3 mg/dL (ref 8.9–10.3)
Chloride: 103 mmol/L (ref 98–111)
Creatinine: 0.86 mg/dL (ref 0.61–1.24)
GFR, Estimated: >60 mL/min (ref >60)
Glucose, Bld: 86 mg/dL (ref 70–99)
Potassium: 3.9 mmol/L (ref 3.5–5.1)
Sodium: 137 mmol/L (ref 135–145)
Total Bilirubin: 0.3 mg/dL (ref <1.2)
Total Protein: 7.4 g/dL (ref 6.5–8.1)

## 2023-05-22 MED ORDER — AMLODIPINE BESYLATE 10 MG PO TABS
10.0000 mg | ORAL_TABLET | Freq: Every morning | ORAL | 1 refills | Status: DC
  Filled 2023-05-22 – 2023-06-07 (×2): qty 90, 90d supply, fill #0

## 2023-05-22 MED ORDER — VITAMIN B-12 1000 MCG PO TABS
1000.0000 ug | ORAL_TABLET | Freq: Every day | ORAL | 3 refills | Status: DC
  Filled 2023-05-22 – 2023-06-07 (×2): qty 90, 90d supply, fill #0
  Filled 2023-08-04: qty 90, 90d supply, fill #1

## 2023-05-22 MED ORDER — CARVEDILOL 6.25 MG PO TABS
6.2500 mg | ORAL_TABLET | Freq: Two times a day (BID) | ORAL | 0 refills | Status: DC
  Filled 2023-05-22 – 2023-06-07 (×2): qty 180, 90d supply, fill #0

## 2023-05-22 NOTE — Progress Notes (Signed)
Labs ordered.

## 2023-05-22 NOTE — Assessment & Plan Note (Signed)
Will increase dose of amlodipine refills. 10 mg daily Will refill Coreg 6.25 mg twice daily
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
## 2023-05-22 NOTE — Progress Notes (Signed)
RN met with patient during his follow up visit with Dr. Cherly Hensen to provide appointment information for the [facility] on 1/15 at 9:20.  Patient also met with LCSW during his visit as well to obtain resources regarding housing, and food.

## 2023-05-22 NOTE — Progress Notes (Signed)
[facility] CSW Progress Note  Clinical Child psychotherapist  made first attempt   to schedule patient's appointment with [employer]. CSW left vm requesting call back.    Marguerita Merles, LCSW Clinical Social Worker [facility]

## 2023-05-23 LAB — INTRINSIC FACTOR ANTIBODIES: Intrinsic Factor: 1 [AU]/ml (ref 0.0–1.1)

## 2023-05-23 LAB — PSA, TOTAL AND FREE
PSA, Free Pct: 13 %
PSA, Free: 7.89 ng/mL
Prostate Specific Ag, Serum: 60.9 ng/mL — ABNORMAL HIGH (ref 0.0–4.0)

## 2023-05-23 LAB — ANTI-PARIETAL ANTIBODY: Parietal Cell Antibody-IgG: 6.1 U (ref 0.0–20.0)

## 2023-05-23 LAB — TESTOSTERONE: Testosterone: 38 ng/dL — ABNORMAL LOW (ref 264–916)

## 2023-06-01 ENCOUNTER — Telehealth: Payer: Self-pay

## 2023-06-01 NOTE — Telephone Encounter (Signed)
 See note

## 2023-06-01 NOTE — Telephone Encounter (Signed)
 Pt is not a patient of the Patient Care Center.   Copied from CRM [id number]. Topic: Referral - Question >> Jun 01, 2023  8:16 AM Bobbye Morton wrote: Reason for CRM: Pt  requesting a call about getting a referral requesting callback at [phone].

## 2023-06-01 NOTE — Telephone Encounter (Signed)
 Copied from CRM [id number]. Topic: Referral - Question >> Jun 01, 2023  8:31 AM Joanette Gula wrote: Patient is returning a call about a referral call

## 2023-06-02 ENCOUNTER — Other Ambulatory Visit (HOSPITAL_COMMUNITY): Payer: Self-pay

## 2023-06-02 MED ORDER — OXYCODONE HCL 5 MG PO TABS
5.0000 mg | ORAL_TABLET | ORAL | 0 refills | Status: DC | PRN
  Filled 2023-06-02: qty 45, 4d supply, fill #0

## 2023-06-07 ENCOUNTER — Ambulatory Visit (INDEPENDENT_AMBULATORY_CARE_PROVIDER_SITE_OTHER): Payer: 59 | Admitting: Nurse Practitioner

## 2023-06-07 ENCOUNTER — Other Ambulatory Visit (HOSPITAL_COMMUNITY): Payer: Self-pay

## 2023-06-07 ENCOUNTER — Encounter: Payer: Self-pay | Admitting: Nurse Practitioner

## 2023-06-07 VITALS — BP 148/89 | HR 72 | Temp 97.5°F | Wt 128.0 lb

## 2023-06-07 DIAGNOSIS — Z23 Encounter for immunization: Secondary | ICD-10-CM | POA: Diagnosis not present

## 2023-06-07 DIAGNOSIS — J302 Other seasonal allergic rhinitis: Secondary | ICD-10-CM

## 2023-06-07 DIAGNOSIS — C61 Malignant neoplasm of prostate: Secondary | ICD-10-CM | POA: Diagnosis not present

## 2023-06-07 DIAGNOSIS — E119 Type 2 diabetes mellitus without complications: Secondary | ICD-10-CM

## 2023-06-07 DIAGNOSIS — N400 Enlarged prostate without lower urinary tract symptoms: Secondary | ICD-10-CM

## 2023-06-07 DIAGNOSIS — N529 Male erectile dysfunction, unspecified: Secondary | ICD-10-CM

## 2023-06-07 LAB — POCT GLYCOSYLATED HEMOGLOBIN (HGB A1C): Hemoglobin A1C: 5.5 % (ref 4.0–5.6)

## 2023-06-07 MED ORDER — CETIRIZINE HCL 10 MG PO TABS
10.0000 mg | ORAL_TABLET | Freq: Every day | ORAL | 11 refills | Status: DC
  Filled 2023-06-07: qty 30, 30d supply, fill #0
  Filled 2023-08-04: qty 30, 30d supply, fill #1
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
## 2023-06-07 NOTE — Patient Instructions (Signed)
 1. Need for influenza vaccination (Primary)  - Flu vaccine trivalent PF, 6mos and older(Flulaval,Afluria,Fluarix,Fluzone)  2. Type 2 diabetes mellitus without complication, without long-term current use of insulin  (HCC)  - POCT glycosylated hemoglobin (Hb A1C)  3. Malignant neoplasm of prostate [facility])  - Ambulatory referral to Urology  4. Benign prostatic hyperplasia, unspecified whether lower urinary tract symptoms present  - Ambulatory referral to Urology  5. Erectile dysfunction, unspecified erectile dysfunction type  - Ambulatory referral to Urology  6. Seasonal allergies  - cetirizine  (ZYRTEC ) 10 MG tablet; Take 1 tablet (10 mg total) by mouth daily.  Dispense: 30 tablet; Refill: 11   Follow up:   Follow up in 3 months

## 2023-06-13 ENCOUNTER — Other Ambulatory Visit (HOSPITAL_COMMUNITY): Payer: Self-pay

## 2023-06-15 ENCOUNTER — Other Ambulatory Visit (HOSPITAL_COMMUNITY): Payer: Self-pay

## 2023-06-16 ENCOUNTER — Telehealth: Payer: Self-pay

## 2023-06-16 NOTE — Assessment & Plan Note (Signed)
Had recommended start darolutamide.  He will start today. CBC, CMP, PSA, testosterone monthly
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
## 2023-06-16 NOTE — Telephone Encounter (Signed)
TC from pt, stating that no one has arranged his transportation for 06/19/23 appointments. Per Alcario Drought in registration, pt is given our transportation coordinator's phone number (Christian Vilsaint) to set this up: [phone].

## 2023-06-16 NOTE — Assessment & Plan Note (Signed)
On Lipitor Monitor for lipid panel and SEs

## 2023-06-16 NOTE — Assessment & Plan Note (Signed)
Neg IF and AP antibodies B12 1000 mcg daily

## 2023-06-18 ENCOUNTER — Other Ambulatory Visit (HOSPITAL_COMMUNITY): Payer: Self-pay

## 2023-06-19 ENCOUNTER — Other Ambulatory Visit (HOSPITAL_COMMUNITY): Payer: Self-pay

## 2023-06-19 ENCOUNTER — Inpatient Hospital Stay: Payer: 59

## 2023-06-19 ENCOUNTER — Other Ambulatory Visit: Payer: Self-pay | Admitting: Pharmacy Technician

## 2023-06-19 ENCOUNTER — Other Ambulatory Visit: Payer: Self-pay

## 2023-06-19 ENCOUNTER — Telehealth: Payer: Self-pay | Admitting: Pharmacy Technician

## 2023-06-19 ENCOUNTER — Other Ambulatory Visit: Payer: Self-pay | Admitting: *Deleted

## 2023-06-19 ENCOUNTER — Inpatient Hospital Stay (HOSPITAL_BASED_OUTPATIENT_CLINIC_OR_DEPARTMENT_OTHER): Payer: 59

## 2023-06-19 ENCOUNTER — Telehealth: Payer: Self-pay | Admitting: *Deleted

## 2023-06-19 ENCOUNTER — Inpatient Hospital Stay: Payer: 59 | Attending: Radiation Oncology

## 2023-06-19 VITALS — BP 176/111 | HR 74 | Temp 97.7°F | Resp 15 | Wt 136.7 lb

## 2023-06-19 DIAGNOSIS — R112 Nausea with vomiting, unspecified: Secondary | ICD-10-CM

## 2023-06-19 DIAGNOSIS — G893 Neoplasm related pain (acute) (chronic): Secondary | ICD-10-CM | POA: Diagnosis not present

## 2023-06-19 DIAGNOSIS — D519 Vitamin B12 deficiency anemia, unspecified: Secondary | ICD-10-CM | POA: Diagnosis not present

## 2023-06-19 DIAGNOSIS — I10 Essential (primary) hypertension: Secondary | ICD-10-CM

## 2023-06-19 DIAGNOSIS — C61 Malignant neoplasm of prostate: Secondary | ICD-10-CM | POA: Diagnosis present

## 2023-06-19 DIAGNOSIS — C7951 Secondary malignant neoplasm of bone: Secondary | ICD-10-CM | POA: Diagnosis not present

## 2023-06-19 DIAGNOSIS — D513 Other dietary vitamin B12 deficiency anemia: Secondary | ICD-10-CM

## 2023-06-19 DIAGNOSIS — E7849 Other hyperlipidemia: Secondary | ICD-10-CM

## 2023-06-19 DIAGNOSIS — Z9189 Other specified personal risk factors, not elsewhere classified: Secondary | ICD-10-CM | POA: Insufficient documentation

## 2023-06-19 DIAGNOSIS — E785 Hyperlipidemia, unspecified: Secondary | ICD-10-CM | POA: Diagnosis not present

## 2023-06-19 LAB — CMP (CANCER CENTER ONLY)
ALT: 6 U/L (ref 0–44)
AST: 16 U/L (ref 15–41)
Albumin: 3.5 g/dL (ref 3.5–5.0)
Alkaline Phosphatase: 356 U/L — ABNORMAL HIGH (ref 38–126)
Anion gap: 6 (ref 5–15)
BUN: 12 mg/dL (ref 8–23)
CO2: 27 mmol/L (ref 22–32)
Calcium: 8.9 mg/dL (ref 8.9–10.3)
Chloride: 100 mmol/L (ref 98–111)
Creatinine: 0.87 mg/dL (ref 0.61–1.24)
GFR, Estimated: >60 mL/min (ref >60)
Glucose, Bld: 110 mg/dL — ABNORMAL HIGH (ref 70–99)
Potassium: 4 mmol/L (ref 3.5–5.1)
Sodium: 133 mmol/L — ABNORMAL LOW (ref 135–145)
Total Bilirubin: 0.2 mg/dL (ref 0.0–1.2)
Total Protein: 7.1 g/dL (ref 6.5–8.1)

## 2023-06-19 LAB — CBC WITH DIFFERENTIAL (CANCER CENTER ONLY)
Abs Immature Granulocytes: 0.02 10*3/uL (ref 0.00–0.07)
Basophils Absolute: 0 10*3/uL (ref 0.0–0.1)
Basophils Relative: 1 %
Eosinophils Absolute: 0.2 10*3/uL (ref 0.0–0.5)
Eosinophils Relative: 4 %
HCT: 34.3 % — ABNORMAL LOW (ref 39.0–52.0)
Hemoglobin: 11.9 g/dL — ABNORMAL LOW (ref 13.0–17.0)
Immature Granulocytes: 1 %
Lymphocytes Relative: 8 %
Lymphs Abs: 0.4 10*3/uL — ABNORMAL LOW (ref 0.7–4.0)
MCH: 26.6 pg (ref 26.0–34.0)
MCHC: 34.7 g/dL (ref 30.0–36.0)
MCV: 76.7 fL — ABNORMAL LOW (ref 80.0–100.0)
Monocytes Absolute: 0.6 10*3/uL (ref 0.1–1.0)
Monocytes Relative: 14 %
Neutro Abs: 3.1 10*3/uL (ref 1.7–7.7)
Neutrophils Relative %: 72 %
Platelet Count: 304 10*3/uL (ref 150–400)
RBC: 4.47 MIL/uL (ref 4.22–5.81)
RDW: 14.5 % (ref 11.5–15.5)
WBC Count: 4.3 10*3/uL (ref 4.0–10.5)
nRBC: 0 % (ref 0.0–0.2)

## 2023-06-19 MED ORDER — OXYCODONE HCL 5 MG PO TABS
5.0000 mg | ORAL_TABLET | Freq: Four times a day (QID) | ORAL | 0 refills | Status: DC | PRN
  Filled 2023-06-19: qty 60, 15d supply, fill #0

## 2023-06-19 MED ORDER — AMLODIPINE BESYLATE 10 MG PO TABS: 10.0000 mg | ORAL_TABLET | Freq: Every morning | ORAL | 1 refills | Status: DC

## 2023-06-19 MED ORDER — DAROLUTAMIDE 300 MG PO TABS
600.0000 mg | ORAL_TABLET | Freq: Two times a day (BID) | ORAL | 11 refills | Status: DC
  Filled 2023-06-19: qty 120, 30d supply, fill #0
  Filled 2023-07-11: qty 120, 30d supply, fill #1
  Filled 2023-08-11: qty 120, 30d supply, fill #2
  Filled 2023-09-06: qty 120, 30d supply, fill #3
  Filled 2023-10-12: qty 120, 30d supply, fill #4
  Filled 2023-11-15 – 2023-12-07 (×3): qty 120, 30d supply, fill #5

## 2023-06-19 MED ORDER — CARVEDILOL 6.25 MG PO TABS
6.2500 mg | ORAL_TABLET | Freq: Two times a day (BID) | ORAL | 0 refills | Status: DC
  Filled 2023-06-19: qty 180, 90d supply, fill #0

## 2023-06-19 MED ORDER — PROCHLORPERAZINE MALEATE 10 MG PO TABS
10.0000 mg | ORAL_TABLET | Freq: Three times a day (TID) | ORAL | 0 refills | Status: DC | PRN
  Filled 2023-06-19: qty 30, 10d supply, fill #0

## 2023-06-19 MED ORDER — AMLODIPINE BESYLATE 10 MG PO TABS
10.0000 mg | ORAL_TABLET | Freq: Every morning | ORAL | 1 refills | Status: DC
  Filled 2023-06-19: qty 90, 90d supply, fill #0

## 2023-06-19 MED ORDER — DAROLUTAMIDE 300 MG PO TABS: 600.0000 mg | ORAL_TABLET | Freq: Two times a day (BID) | ORAL | 11 refills | Status: DC

## 2023-06-19 NOTE — Telephone Encounter (Signed)
Oral Oncology Patient Advocate Encounter  Prior Authorization for Merleen Nicely has been approved.    PA# [id number] Effective dates: 06/19/23 through 05/22/24  Patients co-pay is $0.    Jinger Neighbors, CPhT-Adv Oncology Pharmacy Patient Advocate [facility] Direct Number: [phone]  Fax: [phone]
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
## 2023-06-19 NOTE — Telephone Encounter (Signed)
Pt called this afternoon, states he feels like he needs some IVF. Has not picked up antiemetics. Message to Dr Cherly Hensen and schedulers and transportation services

## 2023-06-19 NOTE — Progress Notes (Unsigned)
Specialty Pharmacy Initial Fill Coordination Note  Troy Mclaughlin is a 68 y.o. male contacted today regarding refills of specialty medication(s) Darolutamide (NUBEQA) .  Patient requested Delivery  on 06/21/23  to verified address [address]   [place] [address]-   Medication will be filled on 06/20/23.   Patient is aware of $0 copayment.

## 2023-06-19 NOTE — Assessment & Plan Note (Signed)
baseline bone mineral density study when able.  calcium (1000-1200 mg daily from food and supplements) and vitamin D3 (1000 IU daily) Zometa 4 mg every 3 months having bone metastases. Control and prevent diabetes Control HTN and HLD, heart healthy diet Weight-bearing exercises (30 minutes per day) Limit alcohol consumption and avoid smoking

## 2023-06-19 NOTE — Assessment & Plan Note (Signed)
Amlodipine 10 mg daily Will refill Coreg 6.25 mg twice daily

## 2023-06-19 NOTE — Telephone Encounter (Signed)
Oral Oncology Patient Advocate Encounter  Patient was never able to return to the office and sign PAP renewal forms. Patient is due for a refill of Nubeqa. I spoke with the patient about the forms requiring a signature. Patient will need transportation in order to come to the office and sign. I have reached out to his care team for assistance.  Jinger Neighbors, CPhT-Adv Oncology Pharmacy Patient Advocate [facility] Direct Number: [phone]  Fax: [phone]

## 2023-06-19 NOTE — Telephone Encounter (Signed)
Oral Oncology Patient Advocate Encounter  Completed new PA for patient as part of the renewal process. Patient has already met their out of pocket max for 2025 and [place] is now $0 on insurance. Will fill at [facility] and cancel PAP.   Jinger Neighbors, CPhT-Adv Oncology Pharmacy Patient Advocate [facility] Direct Number: [phone]  Fax: [phone]\

## 2023-06-19 NOTE — Progress Notes (Signed)
Darolutamide resent to [facility]

## 2023-06-20 ENCOUNTER — Inpatient Hospital Stay: Payer: 59

## 2023-06-20 DIAGNOSIS — Z23 Encounter for immunization: Secondary | ICD-10-CM

## 2023-06-20 LAB — PSA, TOTAL AND FREE
PSA, Free Pct: 10.3 %
PSA, Free: 13.7 ng/mL
Prostate Specific Ag, Serum: 133 ng/mL — ABNORMAL HIGH (ref 0.0–4.0)

## 2023-06-20 LAB — TESTOSTERONE: Testosterone: 334 ng/dL (ref 264–916)

## 2023-06-20 NOTE — Progress Notes (Signed)
Patient is currently on Nubeqa and was receiving from patient assistance program. After new year patients copay is now $0. Patient is now filling at [facility]. Patient has been on therapy and dose not have any questions. See encounter opened on 04/10/2023.   Troy Mclaughlin is a 68 y.o. male who will be followed by the specialty pharmacy service for RxSp Oncology    Review of administration, indication, effectiveness, safety, potential side effects, storage/disposable, and missed dose instructions occurred today for patient's specialty medication(s) Darolutamide Merleen Nicely)     Patient/Caregiver did not have any additional questions or concerns.   Patient's therapy is appropriate to: Continue    Goals Addressed             This Visit's Progress    Maintain optimal adherence to therapy       Patient is on track. Patient will maintain adherence        Bethel Born, PharmD Hematology/Oncology Clinical Pharmacist [facility] [phone]

## 2023-06-21 ENCOUNTER — Telehealth: Payer: Self-pay

## 2023-06-21 NOTE — Telephone Encounter (Signed)
[facility] CSW Progress Note  Clinical Social Worker  attempted to reach patient  to discuss medication. Unable to leave vm.    Marguerita Merles, LCSW Clinical Social Worker [facility]

## 2023-06-22 ENCOUNTER — Other Ambulatory Visit: Payer: Self-pay

## 2023-06-23 ENCOUNTER — Other Ambulatory Visit: Payer: Self-pay

## 2023-06-26 NOTE — Progress Notes (Signed)
RN spoke with patient to follow up on tolerating medication, darolutamide.   Patient denies any recent nausea.  RN provided education on use of compazine if nausea develops verbalized understanding.

## 2023-06-30 ENCOUNTER — Ambulatory Visit: Payer: Medicare Other | Admitting: Urology

## 2023-07-04 NOTE — Progress Notes (Signed)
RN left message for call back to check in with patient to continue check in to ensure he continues to tolerate medication.  Direct number provided for call back.

## 2023-07-05 ENCOUNTER — Other Ambulatory Visit (HOSPITAL_COMMUNITY): Payer: Self-pay

## 2023-07-05 ENCOUNTER — Other Ambulatory Visit: Payer: Self-pay

## 2023-07-06 ENCOUNTER — Other Ambulatory Visit: Payer: Self-pay

## 2023-07-06 ENCOUNTER — Other Ambulatory Visit (HOSPITAL_COMMUNITY): Payer: Self-pay

## 2023-07-06 MED ORDER — BENZONATATE 200 MG PO CAPS: 200.0000 mg | ORAL_CAPSULE | Freq: Three times a day (TID) | ORAL | 0 refills | Status: DC | PRN

## 2023-07-06 MED ORDER — OXYCODONE HCL 5 MG PO TABS
5.0000 mg | ORAL_TABLET | Freq: Four times a day (QID) | ORAL | 0 refills | Status: DC | PRN
  Filled 2023-07-06: qty 60, 15d supply, fill #0

## 2023-07-06 NOTE — Progress Notes (Signed)
Oxycodone refilled, x 60 tabs. Will need to be requested only in appointment in the future.  Tessalon 200 mg 3x daily as needed sent to [facility].

## 2023-07-07 ENCOUNTER — Other Ambulatory Visit: Payer: Self-pay

## 2023-07-07 ENCOUNTER — Other Ambulatory Visit (HOSPITAL_COMMUNITY): Payer: Self-pay
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
## 2023-07-11 ENCOUNTER — Other Ambulatory Visit: Payer: Self-pay

## 2023-07-11 ENCOUNTER — Other Ambulatory Visit (HOSPITAL_COMMUNITY): Payer: Self-pay

## 2023-07-11 NOTE — Progress Notes (Signed)
Specialty Pharmacy Refill Coordination Note  Troy Mclaughlin is a 68 y.o. male contacted today regarding refills of specialty medication(s) Darolutamide Merleen Nicely)   Patient requested Delivery   Delivery date: 07/18/23   Verified address: [address] [place] [address]   Medication will be filled on 07/17/23.

## 2023-07-11 NOTE — Progress Notes (Signed)
Specialty Pharmacy Ongoing Clinical Assessment Note  Troy Mclaughlin is a 68 y.o. male who is being followed by the specialty pharmacy service for RxSp Oncology   Patient's specialty medication(s) reviewed today: Darolutamide (NUBEQA)   Missed doses in the last 4 weeks: 0   Patient/Caregiver did not have any additional questions or concerns.   Therapeutic benefit summary: Patient is achieving benefit   Adverse events/side effects summary: Experienced adverse events/side effects (diarrhea, headache, dizziness, and fatigue; recommended trying Imodium AD for diarrhea)   Patient's therapy is appropriate to: Continue    Goals Addressed             This Visit's Progress    Maintain optimal adherence to therapy   On track    Patient is on track. Patient will maintain adherence         Follow up:  3 months  Servando Snare Specialty Pharmacist

## 2023-07-12 ENCOUNTER — Other Ambulatory Visit: Payer: Self-pay

## 2023-07-12 DIAGNOSIS — D518 Other vitamin B12 deficiency anemias: Secondary | ICD-10-CM

## 2023-07-12 DIAGNOSIS — C61 Malignant neoplasm of prostate: Secondary | ICD-10-CM

## 2023-07-12 NOTE — Assessment & Plan Note (Deleted)
 History of partial gastrectomy Neg IF and AP antibodies B12 1000 mcg daily
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
## 2023-07-12 NOTE — Assessment & Plan Note (Deleted)
 Continue darolutamide.  Continue ADT CBC, CMP, PSA, testosterone monthly

## 2023-07-12 NOTE — Assessment & Plan Note (Deleted)
 baseline bone mineral density study when able.  calcium (1000-1200 mg daily from food and supplements) and vitamin D3 (1000 IU daily) Zometa 4 mg every 3 months having bone metastases. Control and prevent diabetes Control HTN and HLD, heart healthy diet Weight-bearing exercises (30 minutes per day) Limit alcohol consumption and avoid smoking

## 2023-07-12 NOTE — Progress Notes (Deleted)
 Patient Care Team: Ivonne Andrew, NP as PCP - General (Pulmonary Disease) Cherlyn Cushing, RN as Oncology Nurse Navigator  Clinic Day:  07/12/2023  Referring physician: Ivonne Andrew, NP  ASSESSMENT & PLAN:   Assessment & Plan: Troy Mclaughlin is a 68 y.o.male with history of HLD, HTN, DM2 being [facility].   Current diagnosis: mCRPC Germline testing: negative  Somatic testing: no actionable mutation Previous Treatment: 03/2021 LT-ADT with EBRT. Completed radiation in 08/2021. Paliative radiation to L hip and L rib completed in 03/2023  He finally started darolutamide after last visit in January and has not missed any doses.  Malignant neoplasm of prostate (HCC) Continue darolutamide.  Continue ADT CBC, CMP, PSA, testosterone monthly  Essential hypertension Amlodipine 10 mg daily Coreg 6.25 mg twice daily  B12 deficiency anemia History of partial gastrectomy Neg IF and AP antibodies B12 1000 mcg daily  At risk for side effect of medication baseline bone mineral density study when able.  calcium (1000-1200 mg daily from food and supplements) and vitamin D3 (1000 IU daily) Zometa 4 mg every 3 months having bone metastases. Control and prevent diabetes Control HTN and HLD, heart healthy diet Weight-bearing exercises (30 minutes per day) Limit alcohol consumption and avoid smoking  Cancer associated pain Refill oxycodone ordered before the weekend to [facility].  Report it was stolen Pain is currently controlled.  May use Tylenol 1000 mg as needed up to 3 times a day    The patient understands the plans discussed today and is in agreement with them.  He knows to contact our office if he develops concerns prior to his next appointment.  Melven Sartorius, MD  [facility] - A DEPT OF [facility] [address] [place] [address] Dept: [phone] Dept Fax: [phone]   No  orders of the defined types were placed in this encounter.     CHIEF COMPLAINT:  CC: ***  Current Treatment:  ***  INTERVAL HISTORY:  Troy Mclaughlin is here today for repeat clinical assessment. He denies fevers or chills. He denies pain. His appetite is good. His weight {Weight change:10426}.  I have reviewed the past medical history, past surgical history, social history and family history with the patient and they are unchanged from previous note.  ALLERGIES:  is allergic to lisinopril and metformin and related.  MEDICATIONS:  Current Outpatient Medications  Medication Sig Dispense Refill   acetaminophen (TYLENOL) 500 MG tablet Take 2 tablets (1,000 mg total) by mouth every 6 (six) hours as needed for moderate pain or mild pain. 30 tablet 0   amLODipine (NORVASC) 10 MG tablet Take 1 tablet (10 mg total) by mouth every morning. 90 tablet 1   atorvastatin (LIPITOR) 10 MG tablet Take 1 tablet (10 mg total) by mouth every morning. 30 tablet 0   benzonatate (TESSALON) 200 MG capsule Take 1 capsule (200 mg total) by mouth 3 (three) times daily as needed for cough. 20 capsule 0   carvedilol (COREG) 6.25 MG tablet Take 1 tablet (6.25 mg total) by mouth 2 (two) times daily with a meal. 180 tablet 0   cetirizine (ZYRTEC) 10 MG tablet Take 1 tablet (10 mg total) by mouth daily. 30 tablet 11   cyanocobalamin (VITAMIN B12) 1000 MCG tablet Take 1 tablet (1,000 mcg total) by mouth daily. 90 tablet 3   darolutamide (NUBEQA) 300 MG tablet Take 2 tablets (600 mg total)  by mouth 2 (two) times daily with a meal. 120 tablet 11   hydrALAZINE (APRESOLINE) 10 MG tablet Take 1 tablet (10 mg total) by mouth every 8 (eight) hours. 90 tablet 0   loperamide (IMODIUM A-D) 2 MG tablet Take 1 tablet (2 mg total) by mouth 3 (three) times daily as needed for diarrhea or loose stools. 30 tablet 1   meclizine (ANTIVERT) 25 MG tablet Take 1 tablet (25 mg total) by mouth 3 (three) times daily as needed for dizziness. 30 tablet 0    metoCLOPramide (REGLAN) 10 MG tablet Take 1 tablet (10 mg total) by mouth every 6 (six) hours. 30 tablet 0   nicotine (NICODERM CQ - DOSED IN MG/24 HOURS) 14 mg/24hr patch Place 1 patch (14 mg total) onto the skin daily. 28 patch 0   nitroGLYCERIN (NITROSTAT) 0.4 MG SL tablet DISSOLVE 1 TABLET UNDER THE TONGUE EVERY 5 MINUTES AS NEEDED FOR CHEST PAIN. DO NOT EXCEED A TOTAL OF 3 DOSES IN 15 MINUTES. 25 tablet 1   ondansetron (ZOFRAN) 4 MG tablet Take 1 tablet (4 mg total) by mouth every 8 (eight) hours as needed for nausea 30 tablet 2   ondansetron (ZOFRAN-ODT) 4 MG disintegrating tablet Take 1 tablet (4 mg total) by mouth every 8 (eight) hours as needed for nausea or vomiting. 20 tablet 0   oxyCODONE (OXY IR/ROXICODONE) 5 MG immediate release tablet Take 1 tablet (5 mg total) by mouth every 6 (six) hours as needed. 60 tablet 0   pantoprazole (PROTONIX) 40 MG tablet Take 1 tablet (40 mg total) by mouth daily. 30 tablet 0   potassium chloride SA (KLOR-CON M) 20 MEQ tablet Take 1 tablet (20 mEq total) by mouth 2 (two) times daily for 4 days. 8 tablet 0   prochlorperazine (COMPAZINE) 10 MG tablet Take 1 tablet (10 mg total) by mouth every 8 (eight) hours as needed for nausea or vomiting. Can take 1 tablet before darolutamide 30 tablet 0   sildenafil (VIAGRA) 100 MG tablet Take 1 tablet by mouth 30 minutes before sexual activity as needed for erectile dysfunction (Patient not taking: Reported on 06/07/2023) 6 tablet 4   traZODone (DESYREL) 50 MG tablet Take 0.5 tablets (25 mg total) by mouth at bedtime as needed for sleep. 30 tablet 0   No current facility-administered medications for this visit.    HISTORY OF PRESENT ILLNESS:   Oncology History  Malignant neoplasm of prostate (HCC)  04/11/2020 Tumor Marker   PSA 115   09/03/2020 Tumor Marker   PSA 113   10/27/2020 Cancer Staging   Staging form: Prostate, AJCC 8th Edition - Clinical stage from 10/27/2020: Stage IIIC (cT2a, cN0, cM0, PSA: 113,  Grade Group: 5) - Signed by Marcello Fennel, PA-C on 12/15/2020 Histopathologic type: Adenocarcinoma, NOS Stage prefix: Initial diagnosis Prostate specific antigen (PSA) range: 20 or greater Gleason primary pattern: 5 Gleason secondary pattern: 4 Gleason score: 9 Histologic grading system: 5 grade system Number of biopsy cores examined: 12 Number of biopsy cores positive: 12 Location of positive needle core biopsies: Both sides   10/27/2020 Imaging   From outside report CT AP reported no metastatic disease.   11/17/2020 Imaging   Bone scan Focal abnormal uptake in the posterior portion of the right side of the upper cervical spine most consistent with degenerative changes and 2 foci of abnormal uptake in the posterior portion of the right rib which are most likely posttraumatic.   12/15/2020 Initial Diagnosis   Malignant neoplasm of prostate (  HCC)   03/2021 -  Chemotherapy   Report unable to reach patient for PET scan that was arranged.   Initiated LT ADT and EBRT completed in 08/2021   06/10/2022 Tumor Marker   PSA 7.16   07/14/2022 PET scan   PSMA PET 1. Several foci of intense radiotracer activity in the skeleton consistent with active skeletal metastasis. Metastatic lesions include the LEFT ischium, several spine lesions and multiple rib lesions. 2. On CT portion of exam there are innumerable small sclerotic lesions are which do not have radiotracer activity. 3. No focal activity in prostate gland. 4. No visceral metastasis.   10/18/2022 Tumor Marker   PSA 5.35   10/18/2022 Tumor Marker   PSA 5.35 Testosterone 13   02/13/2023 Tumor Marker   PSA 25 Testosterone 16.9   03/01/2023 Cancer Staging   Staging form: Prostate, AJCC 8th Edition - Pathologic stage from 03/01/2023: Stage IVB (pN0, pM1b, PSA: 25, Grade Group: 5) - Signed by Marcello Fennel, PA-C on 03/09/2023 Stage prefix: Recurrence Prostate specific antigen (PSA) range: 20 or greater Gleason primary pattern:  5 Gleason secondary pattern: 4 Gleason score: 9 Histologic grading system: 5 grade system   03/01/2023 PET scan   PSMA PET Previously there approximately 10 skeletal metastasis now there approximately 50 skeletal metastasis.   IMPRESSION: 1. Significant interval progression of skeletal metastasis. 2. Increase in size and number of radiotracer avid skeletal metastasis. 3. No evidence of visceral metastasis or nodal metastasis. 4. No evidence of local recurrence in the prostate gland.       REVIEW OF SYSTEMS:   All relevant systems were reviewed with the patient and are negative.   VITALS:  There were no vitals taken for this visit.  Wt Readings from Last 3 Encounters:  06/19/23 136 lb 11.2 oz (62 kg)  06/07/23 128 lb (58.1 kg)  05/22/23 138 lb 11.2 oz (62.9 kg)    There is no height or weight on file to calculate BMI.  Performance status (ECOG): {CHL ONC [id number]  PHYSICAL EXAM:   GENERAL:alert, no distress and comfortable SKIN: skin color normal, no rashes  EYES: normal, sclera clear OROPHARYNX: no exudate, no erythema    NECK: supple,  non-tender, without nodularity LYMPH:  no palpable cervical lymphadenopathy LUNGS: clear to auscultation with normal breathing effort.  No wheeze or rales HEART: regular rate & rhythm and no murmurs and no lower extremity edema ABDOMEN: abdomen soft, non-tender and nondistended Musculoskeletal: no edema NEURO: alert, fluent speech, no focal motor/sensory deficits.  Strength and sensation equal bilaterally.  LABORATORY DATA:  I have reviewed the data as listed    Component Value Date/Time   NA 133 (L) 06/19/2023 0734   NA 142 03/01/2021 1225   K 4.0 06/19/2023 0734   CL 100 06/19/2023 0734   CO2 27 06/19/2023 0734   GLUCOSE 110 (H) 06/19/2023 0734   BUN 12 06/19/2023 0734   BUN 6 (L) 03/01/2021 1225   CREATININE 0.87 06/19/2023 0734   CALCIUM 8.9 06/19/2023 0734   PROT 7.1 06/19/2023 0734   PROT 7.3 03/01/2021 1225    ALBUMIN 3.5 06/19/2023 0734   ALBUMIN 4.4 03/01/2021 1225   AST 16 06/19/2023 0734   ALT 6 06/19/2023 0734   ALKPHOS 356 (H) 06/19/2023 0734   BILITOT 0.2 06/19/2023 0734   GFRNONAA >60 06/19/2023 0734   GFRAA >60 01/03/2020 1139    No results found for: "SPEP", "UPEP"  Lab Results  Component Value Date   WBC 4.3 06/19/2023  NEUTROABS 3.1 06/19/2023   HGB 11.9 (L) 06/19/2023   HCT 34.3 (L) 06/19/2023   MCV 76.7 (L) 06/19/2023   PLT 304 06/19/2023      Chemistry      Component Value Date/Time   NA 133 (L) 06/19/2023 0734   NA 142 03/01/2021 1225   K 4.0 06/19/2023 0734   CL 100 06/19/2023 0734   CO2 27 06/19/2023 0734   BUN 12 06/19/2023 0734   BUN 6 (L) 03/01/2021 1225   CREATININE 0.87 06/19/2023 0734      Component Value Date/Time   CALCIUM 8.9 06/19/2023 0734   ALKPHOS 356 (H) 06/19/2023 0734   AST 16 06/19/2023 0734   ALT 6 06/19/2023 0734   BILITOT 0.2 06/19/2023 0734       RADIOGRAPHIC STUDIES: I have personally reviewed the radiological images as listed and agreed with the findings in the report. No results found.

## 2023-07-12 NOTE — Assessment & Plan Note (Deleted)
 Amlodipine 10 mg daily Coreg 6.25 mg twice daily

## 2023-07-12 NOTE — Addendum Note (Signed)
Addended by: Geanie Berlin on: 07/12/2023 09:19 PM   Modules accepted: Orders

## 2023-07-13 ENCOUNTER — Inpatient Hospital Stay: Payer: 59

## 2023-07-13 ENCOUNTER — Emergency Department (HOSPITAL_COMMUNITY): Payer: 59

## 2023-07-13 ENCOUNTER — Inpatient Hospital Stay: Payer: 59 | Admitting: Nurse Practitioner

## 2023-07-13 ENCOUNTER — Telehealth: Payer: Self-pay

## 2023-07-13 ENCOUNTER — Other Ambulatory Visit: Payer: Self-pay

## 2023-07-13 ENCOUNTER — Inpatient Hospital Stay: Payer: 59 | Attending: Radiation Oncology

## 2023-07-13 ENCOUNTER — Inpatient Hospital Stay (HOSPITAL_COMMUNITY)
Admission: EM | Admit: 2023-07-13 | Discharge: 2023-07-19 | DRG: 193 | Disposition: A | Discharge status: Home Health | Payer: 59 | Attending: Internal Medicine | Admitting: Internal Medicine

## 2023-07-13 ENCOUNTER — Encounter (HOSPITAL_COMMUNITY): Payer: Self-pay | Admitting: Emergency Medicine

## 2023-07-13 ENCOUNTER — Other Ambulatory Visit (HOSPITAL_COMMUNITY): Payer: Self-pay

## 2023-07-13 DIAGNOSIS — R54 Age-related physical debility: Secondary | ICD-10-CM | POA: Diagnosis present

## 2023-07-13 DIAGNOSIS — Z515 Encounter for palliative care: Secondary | ICD-10-CM | POA: Diagnosis not present

## 2023-07-13 DIAGNOSIS — E785 Hyperlipidemia, unspecified: Secondary | ICD-10-CM | POA: Diagnosis present

## 2023-07-13 DIAGNOSIS — H409 Unspecified glaucoma: Secondary | ICD-10-CM | POA: Diagnosis present

## 2023-07-13 DIAGNOSIS — J189 Pneumonia, unspecified organism: Secondary | ICD-10-CM | POA: Diagnosis not present

## 2023-07-13 DIAGNOSIS — C7951 Secondary malignant neoplasm of bone: Secondary | ICD-10-CM | POA: Diagnosis present

## 2023-07-13 DIAGNOSIS — Z800 Family history of malignant neoplasm of digestive organs: Secondary | ICD-10-CM

## 2023-07-13 DIAGNOSIS — G8929 Other chronic pain: Secondary | ICD-10-CM | POA: Diagnosis present

## 2023-07-13 DIAGNOSIS — B351 Tinea unguium: Secondary | ICD-10-CM | POA: Diagnosis present

## 2023-07-13 DIAGNOSIS — C61 Malignant neoplasm of prostate: Secondary | ICD-10-CM

## 2023-07-13 DIAGNOSIS — Z87892 Personal history of anaphylaxis: Secondary | ICD-10-CM

## 2023-07-13 DIAGNOSIS — Z85038 Personal history of other malignant neoplasm of large intestine: Secondary | ICD-10-CM

## 2023-07-13 DIAGNOSIS — Z1152 Encounter for screening for COVID-19: Secondary | ICD-10-CM | POA: Diagnosis not present

## 2023-07-13 DIAGNOSIS — F17210 Nicotine dependence, cigarettes, uncomplicated: Secondary | ICD-10-CM | POA: Diagnosis present

## 2023-07-13 DIAGNOSIS — D630 Anemia in neoplastic disease: Secondary | ICD-10-CM | POA: Diagnosis present

## 2023-07-13 DIAGNOSIS — I10 Essential (primary) hypertension: Secondary | ICD-10-CM

## 2023-07-13 DIAGNOSIS — E538 Deficiency of other specified B group vitamins: Secondary | ICD-10-CM | POA: Diagnosis present

## 2023-07-13 DIAGNOSIS — Z681 Body mass index (BMI) 19 or less, adult: Secondary | ICD-10-CM | POA: Diagnosis not present

## 2023-07-13 DIAGNOSIS — J069 Acute upper respiratory infection, unspecified: Secondary | ICD-10-CM | POA: Diagnosis present

## 2023-07-13 DIAGNOSIS — C189 Malignant neoplasm of colon, unspecified: Secondary | ICD-10-CM

## 2023-07-13 DIAGNOSIS — E876 Hypokalemia: Secondary | ICD-10-CM | POA: Diagnosis present

## 2023-07-13 DIAGNOSIS — Z933 Colostomy status: Secondary | ICD-10-CM | POA: Diagnosis not present

## 2023-07-13 DIAGNOSIS — E43 Unspecified severe protein-calorie malnutrition: Secondary | ICD-10-CM | POA: Diagnosis present

## 2023-07-13 DIAGNOSIS — Z7189 Other specified counseling: Secondary | ICD-10-CM | POA: Diagnosis not present

## 2023-07-13 DIAGNOSIS — R109 Unspecified abdominal pain: Secondary | ICD-10-CM | POA: Diagnosis present

## 2023-07-13 DIAGNOSIS — Z79899 Other long term (current) drug therapy: Secondary | ICD-10-CM

## 2023-07-13 DIAGNOSIS — E119 Type 2 diabetes mellitus without complications: Secondary | ICD-10-CM | POA: Diagnosis present

## 2023-07-13 DIAGNOSIS — E875 Hyperkalemia: Secondary | ICD-10-CM | POA: Diagnosis not present

## 2023-07-13 DIAGNOSIS — Z888 Allergy status to other drugs, medicaments and biological substances status: Secondary | ICD-10-CM

## 2023-07-13 DIAGNOSIS — Z8711 Personal history of peptic ulcer disease: Secondary | ICD-10-CM

## 2023-07-13 DIAGNOSIS — N529 Male erectile dysfunction, unspecified: Secondary | ICD-10-CM | POA: Diagnosis present

## 2023-07-13 DIAGNOSIS — I2510 Atherosclerotic heart disease of native coronary artery without angina pectoris: Secondary | ICD-10-CM | POA: Diagnosis present

## 2023-07-13 DIAGNOSIS — G893 Neoplasm related pain (acute) (chronic): Secondary | ICD-10-CM | POA: Diagnosis present

## 2023-07-13 DIAGNOSIS — W3400XS Accidental discharge from unspecified firearms or gun, sequela: Secondary | ICD-10-CM

## 2023-07-13 DIAGNOSIS — D638 Anemia in other chronic diseases classified elsewhere: Secondary | ICD-10-CM | POA: Diagnosis present

## 2023-07-13 DIAGNOSIS — B353 Tinea pedis: Secondary | ICD-10-CM | POA: Diagnosis present

## 2023-07-13 DIAGNOSIS — Z8673 Personal history of transient ischemic attack (TIA), and cerebral infarction without residual deficits: Secondary | ICD-10-CM

## 2023-07-13 DIAGNOSIS — K5903 Drug induced constipation: Secondary | ICD-10-CM | POA: Diagnosis present

## 2023-07-13 DIAGNOSIS — Z9189 Other specified personal risk factors, not elsewhere classified: Secondary | ICD-10-CM

## 2023-07-13 DIAGNOSIS — I252 Old myocardial infarction: Secondary | ICD-10-CM

## 2023-07-13 DIAGNOSIS — T402X5A Adverse effect of other opioids, initial encounter: Secondary | ICD-10-CM | POA: Diagnosis present

## 2023-07-13 DIAGNOSIS — K219 Gastro-esophageal reflux disease without esophagitis: Secondary | ICD-10-CM | POA: Diagnosis present

## 2023-07-13 DIAGNOSIS — Z8249 Family history of ischemic heart disease and other diseases of the circulatory system: Secondary | ICD-10-CM

## 2023-07-13 DIAGNOSIS — S36599S Other injury of unspecified part of colon, sequela: Secondary | ICD-10-CM

## 2023-07-13 DIAGNOSIS — K2100 Gastro-esophageal reflux disease with esophagitis, without bleeding: Secondary | ICD-10-CM | POA: Diagnosis present

## 2023-07-13 DIAGNOSIS — Z823 Family history of stroke: Secondary | ICD-10-CM

## 2023-07-13 DIAGNOSIS — D518 Other vitamin B12 deficiency anemias: Secondary | ICD-10-CM

## 2023-07-13 LAB — RESP PANEL BY RT-PCR (RSV, FLU A&B, COVID)  RVPGX2
Influenza A by PCR: NEGATIVE
Influenza B by PCR: NEGATIVE
Resp Syncytial Virus by PCR: NEGATIVE
SARS Coronavirus 2 by RT PCR: NEGATIVE

## 2023-07-13 LAB — COMPREHENSIVE METABOLIC PANEL
ALT: 7 U/L (ref 0–44)
AST: 11 U/L — ABNORMAL LOW (ref 15–41)
Albumin: 2.3 g/dL — ABNORMAL LOW (ref 3.5–5.0)
Alkaline Phosphatase: 236 U/L — ABNORMAL HIGH (ref 38–126)
Anion gap: 9 (ref 5–15)
BUN: 11 mg/dL (ref 8–23)
CO2: 21 mmol/L — ABNORMAL LOW (ref 22–32)
Calcium: 7.1 mg/dL — ABNORMAL LOW (ref 8.9–10.3)
Chloride: 107 mmol/L (ref 98–111)
Creatinine, Ser: 0.71 mg/dL (ref 0.61–1.24)
GFR, Estimated: >60 mL/min (ref >60)
Glucose, Bld: 102 mg/dL — ABNORMAL HIGH (ref 70–99)
Potassium: 2.6 mmol/L — CL (ref 3.5–5.1)
Sodium: 137 mmol/L (ref 135–145)
Total Bilirubin: 0.3 mg/dL (ref 0.0–1.2)
Total Protein: 6.2 g/dL — ABNORMAL LOW (ref 6.5–8.1)

## 2023-07-13 LAB — I-STAT CHEM 8, ED
BUN: 9 mg/dL (ref 8–23)
Calcium, Ion: 0.96 mmol/L — ABNORMAL LOW (ref 1.15–1.40)
Chloride: 106 mmol/L (ref 98–111)
Creatinine, Ser: 0.7 mg/dL (ref 0.61–1.24)
Glucose, Bld: 97 mg/dL (ref 70–99)
HCT: 27 % — ABNORMAL LOW (ref 39.0–52.0)
Hemoglobin: 9.2 g/dL — ABNORMAL LOW (ref 13.0–17.0)
Potassium: 2.7 mmol/L — CL (ref 3.5–5.1)
Sodium: 140 mmol/L (ref 135–145)
TCO2: 23 mmol/L (ref 22–32)

## 2023-07-13 LAB — CBC
HCT: 32.9 % — ABNORMAL LOW (ref 39.0–52.0)
Hemoglobin: 10.6 g/dL — ABNORMAL LOW (ref 13.0–17.0)
MCH: 25.3 pg — ABNORMAL LOW (ref 26.0–34.0)
MCHC: 32.2 g/dL (ref 30.0–36.0)
MCV: 78.5 fL — ABNORMAL LOW (ref 80.0–100.0)
Platelets: 421 10*3/uL — ABNORMAL HIGH (ref 150–400)
RBC: 4.19 MIL/uL — ABNORMAL LOW (ref 4.22–5.81)
RDW: 14.5 % (ref 11.5–15.5)
WBC: 7.4 10*3/uL (ref 4.0–10.5)
nRBC: 0 % (ref 0.0–0.2)

## 2023-07-13 LAB — PHOSPHORUS: Phosphorus: 2.5 mg/dL (ref 2.5–4.6)

## 2023-07-13 LAB — I-STAT CG4 LACTIC ACID, ED: Lactic Acid, Venous: 1.1 mmol/L (ref 0.5–1.9)

## 2023-07-13 LAB — GLUCOSE, CAPILLARY: Glucose-Capillary: 120 mg/dL — ABNORMAL HIGH (ref 70–99)

## 2023-07-13 LAB — MAGNESIUM: Magnesium: 1.6 mg/dL — ABNORMAL LOW (ref 1.7–2.4)

## 2023-07-13 LAB — TROPONIN I (HIGH SENSITIVITY): Troponin I (High Sensitivity): 6 ng/L (ref <18)

## 2023-07-13 MED ORDER — ONDANSETRON HCL 4 MG/2ML IJ SOLN
4.0000 mg | Freq: Four times a day (QID) | INTRAMUSCULAR | Status: DC | PRN
Start: 2023-07-13 — End: 2023-07-19
  Administered 2023-07-17: 4 mg via INTRAVENOUS
  Filled 2023-07-13: qty 2

## 2023-07-13 MED ORDER — PANTOPRAZOLE SODIUM 40 MG PO TBEC
40.0000 mg | DELAYED_RELEASE_TABLET | Freq: Every day | ORAL | Status: DC
  Administered 2023-07-13 – 2023-07-19 (×7): 40 mg via ORAL
  Filled 2023-07-13 (×7): qty 1

## 2023-07-13 MED ORDER — TRAZODONE HCL 50 MG PO TABS
25.0000 mg | ORAL_TABLET | Freq: Every evening | ORAL | Status: DC | PRN
  Administered 2023-07-14 – 2023-07-15 (×2): 25 mg via ORAL
  Filled 2023-07-13 (×2): qty 1

## 2023-07-13 MED ORDER — SODIUM CHLORIDE 0.9 % IV SOLN
500.0000 mg | INTRAVENOUS | Status: DC
  Administered 2023-07-13 – 2023-07-16 (×4): 500 mg via INTRAVENOUS
  Filled 2023-07-13 (×5): qty 5

## 2023-07-13 MED ORDER — OXYCODONE HCL 5 MG PO TABS
5.0000 mg | ORAL_TABLET | Freq: Four times a day (QID) | ORAL | Status: DC | PRN
  Administered 2023-07-13 – 2023-07-14 (×3): 5 mg via ORAL
  Filled 2023-07-13 (×3): qty 1

## 2023-07-13 MED ORDER — POTASSIUM CHLORIDE 20 MEQ PO PACK
40.0000 meq | PACK | Freq: Once | ORAL | Status: AC
  Administered 2023-07-13: 40 meq via ORAL
  Filled 2023-07-13: qty 2

## 2023-07-13 MED ORDER — ONDANSETRON HCL 4 MG PO TABS
4.0000 mg | ORAL_TABLET | Freq: Four times a day (QID) | ORAL | Status: DC | PRN
Start: 2023-07-13 — End: 2023-07-19

## 2023-07-13 MED ORDER — MAGNESIUM SULFATE 2 GM/50ML IV SOLN
2.0000 g | Freq: Once | INTRAVENOUS | Status: AC
  Administered 2023-07-13: 2 g via INTRAVENOUS
  Filled 2023-07-13: qty 50

## 2023-07-13 MED ORDER — ALBUTEROL SULFATE (2.5 MG/3ML) 0.083% IN NEBU: 2.5000 mg | INHALATION_SOLUTION | RESPIRATORY_TRACT | Status: DC | PRN

## 2023-07-13 MED ORDER — KETOROLAC TROMETHAMINE 15 MG/ML IJ SOLN
15.0000 mg | Freq: Once | INTRAMUSCULAR | Status: AC
  Administered 2023-07-13: 15 mg via INTRAVENOUS
  Filled 2023-07-13: qty 1

## 2023-07-13 MED ORDER — POTASSIUM CHLORIDE CRYS ER 20 MEQ PO TBCR
40.0000 meq | EXTENDED_RELEASE_TABLET | Freq: Once | ORAL | Status: AC
  Administered 2023-07-13: 40 meq via ORAL
  Filled 2023-07-13: qty 2

## 2023-07-13 MED ORDER — CARVEDILOL 6.25 MG PO TABS
6.2500 mg | ORAL_TABLET | Freq: Two times a day (BID) | ORAL | Status: DC
  Administered 2023-07-14 – 2023-07-15 (×3): 6.25 mg via ORAL
  Filled 2023-07-13 (×3): qty 1

## 2023-07-13 MED ORDER — AMLODIPINE BESYLATE 10 MG PO TABS
10.0000 mg | ORAL_TABLET | Freq: Every morning | ORAL | Status: DC
  Administered 2023-07-14 – 2023-07-19 (×6): 10 mg via ORAL
  Filled 2023-07-13 (×6): qty 1

## 2023-07-13 MED ORDER — ENSURE ENLIVE PO LIQD
237.0000 mL | Freq: Two times a day (BID) | ORAL | Status: DC
  Administered 2023-07-14 – 2023-07-19 (×10): 237 mL via ORAL

## 2023-07-13 MED ORDER — MORPHINE SULFATE (PF) 4 MG/ML IV SOLN
6.0000 mg | Freq: Once | INTRAVENOUS | Status: AC
  Administered 2023-07-13: 6 mg via INTRAVENOUS
  Filled 2023-07-13: qty 2

## 2023-07-13 MED ORDER — SODIUM CHLORIDE 0.9 % IV SOLN
1.0000 g | INTRAVENOUS | Status: DC
  Administered 2023-07-13: 1 g via INTRAVENOUS
  Filled 2023-07-13: qty 10

## 2023-07-13 MED ORDER — PROCHLORPERAZINE EDISYLATE 10 MG/2ML IJ SOLN
7.5000 mg | Freq: Once | INTRAMUSCULAR | Status: AC
  Administered 2023-07-13: 7.5 mg via INTRAVENOUS
  Filled 2023-07-13: qty 2

## 2023-07-13 MED ORDER — POTASSIUM CHLORIDE IN NACL 20-0.9 MEQ/L-% IV SOLN: Freq: Once | INTRAVENOUS | Status: DC

## 2023-07-13 MED ORDER — HYDROMORPHONE HCL 1 MG/ML IJ SOLN
1.0000 mg | Freq: Once | INTRAMUSCULAR | Status: AC
  Administered 2023-07-13: 1 mg via INTRAVENOUS
  Filled 2023-07-13: qty 1

## 2023-07-13 MED ORDER — FAMOTIDINE IN NACL 20-0.9 MG/50ML-% IV SOLN
20.0000 mg | Freq: Once | INTRAVENOUS | Status: AC
  Administered 2023-07-13: 20 mg via INTRAVENOUS
  Filled 2023-07-13: qty 50

## 2023-07-13 MED ORDER — ACETAMINOPHEN 650 MG RE SUPP: 650.0000 mg | Freq: Four times a day (QID) | RECTAL | Status: DC | PRN

## 2023-07-13 MED ORDER — ACETAMINOPHEN 325 MG PO TABS
650.0000 mg | ORAL_TABLET | Freq: Four times a day (QID) | ORAL | Status: DC | PRN
  Administered 2023-07-16 – 2023-07-17 (×4): 650 mg via ORAL
  Filled 2023-07-13 (×5): qty 2

## 2023-07-13 MED ORDER — VITAMIN B-12 1000 MCG PO TABS
1000.0000 ug | ORAL_TABLET | Freq: Every day | ORAL | Status: DC
  Administered 2023-07-13 – 2023-07-19 (×7): 1000 ug via ORAL
  Filled 2023-07-13 (×7): qty 1

## 2023-07-13 NOTE — Plan of Care (Signed)
  Problem: Pain Managment: Goal: General experience of comfort will improve and/or be controlled Outcome: Progressing   Problem: Safety: Goal: Ability to remain free from injury will improve Outcome: Progressing   Problem: Nutrition: Goal: Adequate nutrition will be maintained Outcome: Progressing
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
## 2023-07-13 NOTE — Assessment & Plan Note (Deleted)
 History of partial gastrectomy Neg IF and AP antibodies B12 1000 mcg daily

## 2023-07-13 NOTE — ED Provider Notes (Signed)
  Physical Exam  BP (!) 154/96   Pulse (!) 103   Temp 98.6 F (37 C) (Oral)   Resp 16   SpO2 94%   Physical Exam Vitals and nursing note reviewed.  HENT:     Head: Normocephalic and atraumatic.  Eyes:     Pupils: Pupils are equal, round, and reactive to light.  Cardiovascular:     Rate and Rhythm: Normal rate and regular rhythm.  Pulmonary:     Effort: Pulmonary effort is normal.     Breath sounds: Normal breath sounds.  Abdominal:     Palpations: Abdomen is soft.     Tenderness: There is no abdominal tenderness.  Skin:    General: Skin is warm and dry.  Neurological:     Mental Status: He is alert.  Psychiatric:        Mood and Affect: Mood normal.     Procedures  Procedures  ED Course / MDM   Clinical Course as of 07/13/23 1637  Thu Jul 13, 2023  1620 Laboratory workup notable for hypokalemia of 2.6.  Will provide potassium and magnesium repletion [MP]  1636 Viral panel negative.  Discussed with admitting hospitalist accepts patient for admission [MP]    Clinical Course User Index [MP] Royanne Foots, DO   Medical Decision Making I, Estelle June DO, have assumed care of this patient from the previous provider pending laboratory workup, reevaluation and admission.  He has already received antibiotics for pneumonia and is hemodynamically stable at this time  Amount and/or Complexity of Data Reviewed Labs: ordered. Radiology: ordered.  Risk Prescription drug management.          Royanne Foots, DO 07/13/23 [phone]
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
## 2023-07-13 NOTE — ED Notes (Signed)
Chem 8 critical result 2.7 Potassium. Dr. Freida Busman and Heron Nay RN aware.

## 2023-07-13 NOTE — Progress Notes (Deleted)
 Patient Care Team: Ivonne Andrew, NP as PCP - General (Pulmonary Disease) Cherlyn Cushing, RN as Oncology Nurse Navigator  Clinic Day:  07/13/2023  Referring physician: Ivonne Andrew, NP  ASSESSMENT & PLAN:   Assessment & Plan: Troy Mclaughlin is a 68 y.o.male with history of HLD, HTN, DM2 [facility].   Current diagnosis: mCRPC Germline testing: negative  Somatic testing: no actionable mutation Previous Treatment: 03/2021 LT-ADT with EBRT. Completed radiation in 08/2021. Paliative radiation to L hip and L rib completed in 03/2023  He finally started darolutamide after last visit in January and has not missed any doses.  Malignant neoplasm of prostate (HCC) Continue darolutamide.  Continue ADT CBC, CMP, PSA, testosterone monthly  Cancer associated pain Refill oxycodone ordered before the weekend to [facility].  Report it was stolen Pain is currently controlled.  May use Tylenol 1000 mg as needed up to 3 times a day  B12 deficiency anemia History of partial gastrectomy Neg IF and AP antibodies B12 1000 mcg daily     The patient understands the plans discussed today and is in agreement with them.  He knows to contact our office if he develops concerns prior to his next appointment.  Melven Sartorius, MD  [facility] - A DEPT OF [facility] [address] [place] [address] Dept: [phone] Dept Fax: [phone]   No orders of the defined types were placed in this encounter.     CHIEF COMPLAINT:  CC: ***  Current Treatment:  ***  INTERVAL HISTORY:  Troy Mclaughlin is here today for repeat clinical assessment. He denies fevers or chills. He denies pain. His appetite is good. His weight {Weight change:10426}.  I have reviewed the past medical history, past surgical history, social history and family history with the patient and they are unchanged from previous  note.  ALLERGIES:  is allergic to lisinopril and metformin and related.  MEDICATIONS:  No current facility-administered medications for this visit.   Current Outpatient Medications  Medication Sig Dispense Refill   acetaminophen (TYLENOL) 500 MG tablet Take 2 tablets (1,000 mg total) by mouth every 6 (six) hours as needed for moderate pain or mild pain. 30 tablet 0   amLODipine (NORVASC) 10 MG tablet Take 1 tablet (10 mg total) by mouth every morning. 90 tablet 1   atorvastatin (LIPITOR) 10 MG tablet Take 1 tablet (10 mg total) by mouth every morning. 30 tablet 0   benzonatate (TESSALON) 200 MG capsule Take 1 capsule (200 mg total) by mouth 3 (three) times daily as needed for cough. 20 capsule 0   carvedilol (COREG) 6.25 MG tablet Take 1 tablet (6.25 mg total) by mouth 2 (two) times daily with a meal. 180 tablet 0   cetirizine (ZYRTEC) 10 MG tablet Take 1 tablet (10 mg total) by mouth daily. 30 tablet 11   cyanocobalamin (VITAMIN B12) 1000 MCG tablet Take 1 tablet (1,000 mcg total) by mouth daily. 90 tablet 3   darolutamide (NUBEQA) 300 MG tablet Take 2 tablets (600 mg total) by mouth 2 (two) times daily with a meal. 120 tablet 11   hydrALAZINE (APRESOLINE) 10 MG tablet Take 1 tablet (10 mg total) by mouth every 8 (eight) hours. 90 tablet 0   loperamide (IMODIUM A-D) 2 MG tablet Take 1 tablet (2 mg total) by mouth 3 (three) times daily as needed for diarrhea or loose stools. 30 tablet 1  meclizine (ANTIVERT) 25 MG tablet Take 1 tablet (25 mg total) by mouth 3 (three) times daily as needed for dizziness. 30 tablet 0   metoCLOPramide (REGLAN) 10 MG tablet Take 1 tablet (10 mg total) by mouth every 6 (six) hours. 30 tablet 0   nicotine (NICODERM CQ - DOSED IN MG/24 HOURS) 14 mg/24hr patch Place 1 patch (14 mg total) onto the skin daily. 28 patch 0   nitroGLYCERIN (NITROSTAT) 0.4 MG SL tablet DISSOLVE 1 TABLET UNDER THE TONGUE EVERY 5 MINUTES AS NEEDED FOR CHEST PAIN. DO NOT EXCEED A TOTAL OF 3  DOSES IN 15 MINUTES. 25 tablet 1   ondansetron (ZOFRAN) 4 MG tablet Take 1 tablet (4 mg total) by mouth every 8 (eight) hours as needed for nausea 30 tablet 2   ondansetron (ZOFRAN-ODT) 4 MG disintegrating tablet Take 1 tablet (4 mg total) by mouth every 8 (eight) hours as needed for nausea or vomiting. 20 tablet 0   oxyCODONE (OXY IR/ROXICODONE) 5 MG immediate release tablet Take 1 tablet (5 mg total) by mouth every 6 (six) hours as needed. 60 tablet 0   pantoprazole (PROTONIX) 40 MG tablet Take 1 tablet (40 mg total) by mouth daily. 30 tablet 0   potassium chloride SA (KLOR-CON M) 20 MEQ tablet Take 1 tablet (20 mEq total) by mouth 2 (two) times daily for 4 days. 8 tablet 0   prochlorperazine (COMPAZINE) 10 MG tablet Take 1 tablet (10 mg total) by mouth every 8 (eight) hours as needed for nausea or vomiting. Can take 1 tablet before darolutamide 30 tablet 0   sildenafil (VIAGRA) 100 MG tablet Take 1 tablet by mouth 30 minutes before sexual activity as needed for erectile dysfunction (Patient not taking: Reported on 06/07/2023) 6 tablet 4   traZODone (DESYREL) 50 MG tablet Take 0.5 tablets (25 mg total) by mouth at bedtime as needed for sleep. 30 tablet 0   Facility-Administered Medications Ordered in Other Visits  Medication Dose Route Frequency Provider Last Rate Last Admin   azithromycin (ZITHROMAX) 500 mg in sodium chloride 0.9 % 250 mL IVPB  500 mg Intravenous Q24H Lorre Nick, MD       cefTRIAXone (ROCEPHIN) 1 g in sodium chloride 0.9 % 100 mL IVPB  1 g Intravenous Q24H Lorre Nick, MD       morphine (PF) 4 MG/ML injection 6 mg  6 mg Intravenous Once Lorre Nick, MD        HISTORY OF PRESENT ILLNESS:   Oncology History  Malignant neoplasm of prostate (HCC)  04/11/2020 Tumor Marker   PSA 115   09/03/2020 Tumor Marker   PSA 113   10/27/2020 Cancer Staging   Staging form: Prostate, AJCC 8th Edition - Clinical stage from 10/27/2020: Stage IIIC (cT2a, cN0, cM0, PSA: 113, Grade  Group: 5) - Signed by Marcello Fennel, PA-C on 12/15/2020 Histopathologic type: Adenocarcinoma, NOS Stage prefix: Initial diagnosis Prostate specific antigen (PSA) range: 20 or greater Gleason primary pattern: 5 Gleason secondary pattern: 4 Gleason score: 9 Histologic grading system: 5 grade system Number of biopsy cores examined: 12 Number of biopsy cores positive: 12 Location of positive needle core biopsies: Both sides   10/27/2020 Imaging   From outside report CT AP reported no metastatic disease.   11/17/2020 Imaging   Bone scan Focal abnormal uptake in the posterior portion of the right side of the upper cervical spine most consistent with degenerative changes and 2 foci of abnormal uptake in the posterior portion of the right rib  which are most likely posttraumatic.   12/15/2020 Initial Diagnosis   Malignant neoplasm of prostate (HCC)   03/2021 -  Chemotherapy   Report unable to reach patient for PET scan that was arranged.   Initiated LT ADT and EBRT completed in 08/2021   06/10/2022 Tumor Marker   PSA 7.16   07/14/2022 PET scan   PSMA PET 1. Several foci of intense radiotracer activity in the skeleton consistent with active skeletal metastasis. Metastatic lesions include the LEFT ischium, several spine lesions and multiple rib lesions. 2. On CT portion of exam there are innumerable small sclerotic lesions are which do not have radiotracer activity. 3. No focal activity in prostate gland. 4. No visceral metastasis.   10/18/2022 Tumor Marker   PSA 5.35   10/18/2022 Tumor Marker   PSA 5.35 Testosterone 13   02/13/2023 Tumor Marker   PSA 25 Testosterone 16.9   03/01/2023 Cancer Staging   Staging form: Prostate, AJCC 8th Edition - Pathologic stage from 03/01/2023: Stage IVB (pN0, pM1b, PSA: 25, Grade Group: 5) - Signed by Marcello Fennel, PA-C on 03/09/2023 Stage prefix: Recurrence Prostate specific antigen (PSA) range: 20 or greater Gleason primary pattern: 5 Gleason  secondary pattern: 4 Gleason score: 9 Histologic grading system: 5 grade system   03/01/2023 PET scan   PSMA PET Previously there approximately 10 skeletal metastasis now there approximately 50 skeletal metastasis.   IMPRESSION: 1. Significant interval progression of skeletal metastasis. 2. Increase in size and number of radiotracer avid skeletal metastasis. 3. No evidence of visceral metastasis or nodal metastasis. 4. No evidence of local recurrence in the prostate gland.       REVIEW OF SYSTEMS:   All relevant systems were reviewed with the patient and are negative.   VITALS:  There were no vitals taken for this visit.  Wt Readings from Last 3 Encounters:  06/19/23 136 lb 11.2 oz (62 kg)  06/07/23 128 lb (58.1 kg)  05/22/23 138 lb 11.2 oz (62.9 kg)    There is no height or weight on file to calculate BMI.  Performance status (ECOG): {CHL ONC [id number]  PHYSICAL EXAM:   GENERAL:alert, no distress and comfortable SKIN: skin color normal, no rashes  EYES: normal, sclera clear OROPHARYNX: no exudate, no erythema    NECK: supple,  non-tender, without nodularity LYMPH:  no palpable cervical lymphadenopathy LUNGS: clear to auscultation with normal breathing effort.  No wheeze or rales HEART: regular rate & rhythm and no murmurs and no lower extremity edema ABDOMEN: abdomen soft, non-tender and nondistended Musculoskeletal: no edema NEURO: alert, fluent speech, no focal motor/sensory deficits.  Strength and sensation equal bilaterally.  LABORATORY DATA:  I have reviewed the data as listed    Component Value Date/Time   NA 133 (L) 06/19/2023 0734   NA 142 03/01/2021 1225   K 4.0 06/19/2023 0734   CL 100 06/19/2023 0734   CO2 27 06/19/2023 0734   GLUCOSE 110 (H) 06/19/2023 0734   BUN 12 06/19/2023 0734   BUN 6 (L) 03/01/2021 1225   CREATININE 0.87 06/19/2023 0734   CALCIUM 8.9 06/19/2023 0734   PROT 7.1 06/19/2023 0734   PROT 7.3 03/01/2021 1225   ALBUMIN  3.5 06/19/2023 0734   ALBUMIN 4.4 03/01/2021 1225   AST 16 06/19/2023 0734   ALT 6 06/19/2023 0734   ALKPHOS 356 (H) 06/19/2023 0734   BILITOT 0.2 06/19/2023 0734   GFRNONAA >60 06/19/2023 0734   GFRAA >60 01/03/2020 1139    No results found  for: "SPEP", "UPEP"  Lab Results  Component Value Date   WBC 4.3 06/19/2023   NEUTROABS 3.1 06/19/2023   HGB 11.9 (L) 06/19/2023   HCT 34.3 (L) 06/19/2023   MCV 76.7 (L) 06/19/2023   PLT 304 06/19/2023      Chemistry      Component Value Date/Time   NA 133 (L) 06/19/2023 0734   NA 142 03/01/2021 1225   K 4.0 06/19/2023 0734   CL 100 06/19/2023 0734   CO2 27 06/19/2023 0734   BUN 12 06/19/2023 0734   BUN 6 (L) 03/01/2021 1225   CREATININE 0.87 06/19/2023 0734      Component Value Date/Time   CALCIUM 8.9 06/19/2023 0734   ALKPHOS 356 (H) 06/19/2023 0734   AST 16 06/19/2023 0734   ALT 6 06/19/2023 0734   BILITOT 0.2 06/19/2023 0734       RADIOGRAPHIC STUDIES: I have personally reviewed the radiological images as listed and agreed with the findings in the report. DG Chest Port 1 View Result Date: 07/13/2023 CLINICAL DATA:  Flu-like symptoms with cough and congestion. Undergoing treatment for colon cancer. EXAM: PORTABLE CHEST 1 VIEW COMPARISON:  01/12/2023 FINDINGS: Patchy density throughout the majority of the left lung. Clear right lung. Normal-sized heart. No acute bony abnormality. IMPRESSION: Extensive left lung pneumonia Electronically Signed   By: Beckie Salts M.D.   On: 07/13/2023 15:08
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
## 2023-07-13 NOTE — Assessment & Plan Note (Deleted)
 Continue darolutamide.  Continue ADT CBC, CMP, PSA, testosterone monthly

## 2023-07-13 NOTE — ED Triage Notes (Addendum)
Pt here from home with flu like symptoms. Cough and congestion  currently being treated for Colon CA

## 2023-07-13 NOTE — ED Provider Notes (Signed)
[place] EMERGENCY DEPARTMENT AT [facility] Provider Note   CSN: [id number] Arrival date & time: 07/13/23  1352     History  Chief Complaint  Patient presents with   Influenza    Troy Mclaughlin is a 68 y.o. male.  68 year old male with history of stage IV colon cancer presents with left-sided chest discomfort.  States that he had a chemotherapeutic injection done several weeks ago.  Since that time he has had cough and congestion.  Notes that he has not had any hemoptysis.  Has had subjective fevers for several weeks since injection.  Notes increased dyspnea exertion.  Denies any new leg pain or swelling.  Denies any prior history of PE.  No abdominal pain with it.  No current emesis with diarrhea.  Slight sore throat but no trouble swallowing.  No treatment use for this prior to arrival       Home Medications Prior to Admission medications   Medication Sig Start Date End Date Taking? Authorizing Provider  acetaminophen (TYLENOL) 500 MG tablet Take 2 tablets (1,000 mg total) by mouth every 6 (six) hours as needed for moderate pain or mild pain. 05/05/21   Fayrene Helper, PA-C  amLODipine (NORVASC) 10 MG tablet Take 1 tablet (10 mg total) by mouth every morning. 06/19/23   Melven Sartorius, MD  atorvastatin (LIPITOR) 10 MG tablet Take 1 tablet (10 mg total) by mouth every morning. 01/12/23   Loetta Rough, MD  benzonatate (TESSALON) 200 MG capsule Take 1 capsule (200 mg total) by mouth 3 (three) times daily as needed for cough. 07/06/23   Melven Sartorius, MD  carvedilol (COREG) 6.25 MG tablet Take 1 tablet (6.25 mg total) by mouth 2 (two) times daily with a meal. 06/19/23   Melven Sartorius, MD  cetirizine (ZYRTEC) 10 MG tablet Take 1 tablet (10 mg total) by mouth daily. 06/07/23   Ivonne Andrew, NP  cyanocobalamin (VITAMIN B12) 1000 MCG tablet Take 1 tablet (1,000 mcg total) by mouth daily. 05/22/23   Melven Sartorius, MD  darolutamide (NUBEQA) 300 MG tablet Take 2 tablets  (600 mg total) by mouth 2 (two) times daily with a meal. 06/19/23   Melven Sartorius, MD  hydrALAZINE (APRESOLINE) 10 MG tablet Take 1 tablet (10 mg total) by mouth every 8 (eight) hours. 01/12/23   Loetta Rough, MD  loperamide (IMODIUM A-D) 2 MG tablet Take 1 tablet (2 mg total) by mouth 3 (three) times daily as needed for diarrhea or loose stools. 07/28/21   Bruning, Ashlyn, PA-C  meclizine (ANTIVERT) 25 MG tablet Take 1 tablet (25 mg total) by mouth 3 (three) times daily as needed for dizziness. 09/21/21   Arthor Captain, PA-C  metoCLOPramide (REGLAN) 10 MG tablet Take 1 tablet (10 mg total) by mouth every 6 (six) hours. 12/01/22   Roemhildt, Lorin T, PA-C  nicotine (NICODERM CQ - DOSED IN MG/24 HOURS) 14 mg/24hr patch Place 1 patch (14 mg total) onto the skin daily. 04/10/20   Marcine Matar, MD  nitroGLYCERIN (NITROSTAT) 0.4 MG SL tablet DISSOLVE 1 TABLET UNDER THE TONGUE EVERY 5 MINUTES AS NEEDED FOR CHEST PAIN. DO NOT EXCEED A TOTAL OF 3 DOSES IN 15 MINUTES. 02/03/21   Marcine Matar, MD  ondansetron (ZOFRAN) 4 MG tablet Take 1 tablet (4 mg total) by mouth every 8 (eight) hours as needed for nausea 08/12/22     ondansetron (ZOFRAN-ODT) 4 MG disintegrating tablet Take 1 tablet (4 mg  total) by mouth every 8 (eight) hours as needed for nausea or vomiting. 01/12/23   Loetta Rough, MD  oxyCODONE (OXY IR/ROXICODONE) 5 MG immediate release tablet Take 1 tablet (5 mg total) by mouth every 6 (six) hours as needed. 07/06/23   Melven Sartorius, MD  pantoprazole (PROTONIX) 40 MG tablet Take 1 tablet (40 mg total) by mouth daily. 01/12/23   Loetta Rough, MD  potassium chloride SA (KLOR-CON M) 20 MEQ tablet Take 1 tablet (20 mEq total) by mouth 2 (two) times daily for 4 days. 12/01/22 06/07/23  Roemhildt, Lorin T, PA-C  prochlorperazine (COMPAZINE) 10 MG tablet Take 1 tablet (10 mg total) by mouth every 8 (eight) hours as needed for nausea or vomiting. Can take 1 tablet before darolutamide 06/19/23   Melven Sartorius, MD  sildenafil (VIAGRA) 100 MG tablet Take 1 tablet by mouth 30 minutes before sexual activity as needed for erectile dysfunction Patient not taking: Reported on 06/07/2023 08/12/22     traZODone (DESYREL) 50 MG tablet Take 0.5 tablets (25 mg total) by mouth at bedtime as needed for sleep. 11/30/21   Drema Dallas, MD      Allergies    Lisinopril and Metformin and related    Review of Systems   Review of Systems  All other systems reviewed and are negative.   Physical Exam Updated Vital Signs BP (!) 154/96   Pulse (!) 103   Temp 98.6 F (37 C) (Oral)   Resp 16   SpO2 94%  Physical Exam Vitals and nursing note reviewed.  Constitutional:      General: He is not in acute distress.    Appearance: Normal appearance. He is well-developed. He is not toxic-appearing.  HENT:     Head: Normocephalic and atraumatic.  Eyes:     General: Lids are normal.     Conjunctiva/sclera: Conjunctivae normal.     Pupils: Pupils are equal, round, and reactive to light.  Neck:     Thyroid: No thyroid mass.     Trachea: No tracheal deviation.  Cardiovascular:     Rate and Rhythm: Normal rate and regular rhythm.     Heart sounds: Normal heart sounds. No murmur heard.    No gallop.  Pulmonary:     Effort: Pulmonary effort is normal. No respiratory distress.     Breath sounds: Normal breath sounds. No stridor. No decreased breath sounds, wheezing, rhonchi or rales.  Abdominal:     General: There is no distension.     Palpations: Abdomen is soft.     Tenderness: There is no abdominal tenderness. There is no rebound.  Musculoskeletal:        General: No tenderness. Normal range of motion.     Cervical back: Normal range of motion and neck supple.  Skin:    General: Skin is warm and dry.     Findings: No abrasion or rash.  Neurological:     Mental Status: He is alert and oriented to person, place, and time. Mental status is at baseline.     GCS: GCS eye subscore is 4. GCS verbal  subscore is 5. GCS motor subscore is 6.     Cranial Nerves: No cranial nerve deficit.     Sensory: No sensory deficit.     Motor: Motor function is intact.  Psychiatric:        Attention and Perception: Attention normal.        Speech: Speech normal.  Behavior: Behavior normal.     ED Results / Procedures / Treatments   Labs (all labs ordered are listed, but only abnormal results are displayed) Labs Reviewed  RESP PANEL BY RT-PCR (RSV, FLU A&B, COVID)  [id number]  CBC  COMPREHENSIVE METABOLIC PANEL  I-STAT CHEM 8, ED  TROPONIN I (HIGH SENSITIVITY)    EKG EKG Interpretation Date/Time:  Thursday July 13 2023 14:18:24 EST Ventricular Rate:  99 PR Interval:  129 QRS Duration:  81 QT Interval:  351 QTC Calculation: 451 R Axis:   77  Text Interpretation: Sinus rhythm LVH by voltage Anterior Q waves, possibly due to LVH No significant change since last tracing Confirmed by Lorre Nick ([id number]) on 07/13/2023 2:19:51 PM  Radiology No results found.  Procedures Procedures    Medications Ordered in ED Medications - No data to display  ED Course/ Medical Decision Making/ A&P                                 Medical Decision Making Amount and/or Complexity of Data Reviewed Labs: ordered. Radiology: ordered.  Risk Prescription drug management.   Patient's chest x-ray consistent with pneumonia.  Started on IV antibiotics at this time.  He will require admission.  Labs are pending and care turned over to Dr.        Final Clinical Impression(s) / ED Diagnoses Final diagnoses:  None    Rx / DC Orders ED Discharge Orders     None         Lorre Nick, MD 07/13/23 1555

## 2023-07-14 ENCOUNTER — Inpatient Hospital Stay: Payer: 59

## 2023-07-14 DIAGNOSIS — C61 Malignant neoplasm of prostate: Secondary | ICD-10-CM

## 2023-07-14 DIAGNOSIS — G893 Neoplasm related pain (acute) (chronic): Secondary | ICD-10-CM

## 2023-07-14 DIAGNOSIS — D518 Other vitamin B12 deficiency anemias: Secondary | ICD-10-CM

## 2023-07-14 DIAGNOSIS — J189 Pneumonia, unspecified organism: Secondary | ICD-10-CM | POA: Diagnosis not present

## 2023-07-14 LAB — STREP PNEUMONIAE URINARY ANTIGEN: Strep Pneumo Urinary Antigen: NEGATIVE

## 2023-07-14 LAB — CBC
HCT: 29.8 % — ABNORMAL LOW (ref 39.0–52.0)
Hemoglobin: 10 g/dL — ABNORMAL LOW (ref 13.0–17.0)
MCH: 25.8 pg — ABNORMAL LOW (ref 26.0–34.0)
MCHC: 33.6 g/dL (ref 30.0–36.0)
MCV: 77 fL — ABNORMAL LOW (ref 80.0–100.0)
Platelets: 362 10*3/uL (ref 150–400)
RBC: 3.87 MIL/uL — ABNORMAL LOW (ref 4.22–5.81)
RDW: 14.6 % (ref 11.5–15.5)
WBC: 5.2 10*3/uL (ref 4.0–10.5)
nRBC: 0 % (ref 0.0–0.2)

## 2023-07-14 LAB — COMPREHENSIVE METABOLIC PANEL
ALT: 7 U/L (ref 0–44)
AST: 11 U/L — ABNORMAL LOW (ref 15–41)
Albumin: 2.3 g/dL — ABNORMAL LOW (ref 3.5–5.0)
Alkaline Phosphatase: 234 U/L — ABNORMAL HIGH (ref 38–126)
Anion gap: 10 (ref 5–15)
BUN: 11 mg/dL (ref 8–23)
CO2: 22 mmol/L (ref 22–32)
Calcium: 8.3 mg/dL — ABNORMAL LOW (ref 8.9–10.3)
Chloride: 101 mmol/L (ref 98–111)
Creatinine, Ser: 0.66 mg/dL (ref 0.61–1.24)
GFR, Estimated: >60 mL/min (ref >60)
Glucose, Bld: 101 mg/dL — ABNORMAL HIGH (ref 70–99)
Potassium: 3.8 mmol/L (ref 3.5–5.1)
Sodium: 133 mmol/L — ABNORMAL LOW (ref 135–145)
Total Bilirubin: 0.3 mg/dL (ref 0.0–1.2)
Total Protein: 6.2 g/dL — ABNORMAL LOW (ref 6.5–8.1)

## 2023-07-14 LAB — GLUCOSE, CAPILLARY
Glucose-Capillary: 88 mg/dL (ref 70–99)
Glucose-Capillary: 126 mg/dL — ABNORMAL HIGH (ref 70–99)
Glucose-Capillary: 94 mg/dL (ref 70–99)
Glucose-Capillary: 106 mg/dL — ABNORMAL HIGH (ref 70–99)

## 2023-07-14 MED ORDER — SODIUM CHLORIDE 0.9 % IV SOLN: INTRAVENOUS | Status: DC

## 2023-07-14 MED ORDER — GUAIFENESIN 100 MG/5ML PO LIQD
5.0000 mL | ORAL | Status: DC | PRN
  Administered 2023-07-14 (×2): 5 mL via ORAL
  Filled 2023-07-14 (×2): qty 10

## 2023-07-14 MED ORDER — SODIUM CHLORIDE 0.9 % IV SOLN
2.0000 g | INTRAVENOUS | Status: DC
  Administered 2023-07-14 – 2023-07-16 (×3): 2 g via INTRAVENOUS
  Filled 2023-07-14 (×3): qty 20

## 2023-07-14 MED ORDER — LIDOCAINE 5 % EX PTCH
1.0000 | MEDICATED_PATCH | CUTANEOUS | Status: DC
  Administered 2023-07-14 – 2023-07-19 (×6): 1 via TRANSDERMAL
  Filled 2023-07-14 (×6): qty 1

## 2023-07-14 MED ORDER — HYDROMORPHONE HCL 1 MG/ML IJ SOLN
0.5000 mg | INTRAMUSCULAR | Status: DC | PRN
  Administered 2023-07-15 – 2023-07-17 (×5): 0.5 mg via INTRAVENOUS
  Filled 2023-07-14 (×5): qty 0.5

## 2023-07-14 MED ORDER — GUAIFENESIN ER 600 MG PO TB12
600.0000 mg | ORAL_TABLET | Freq: Two times a day (BID) | ORAL | Status: DC
Start: 2023-07-14 — End: 2023-07-19
  Administered 2023-07-14 – 2023-07-19 (×11): 600 mg via ORAL
  Filled 2023-07-14 (×11): qty 1

## 2023-07-14 MED ORDER — HYDROCOD POLI-CHLORPHE POLI ER 10-8 MG/5ML PO SUER
5.0000 mL | Freq: Two times a day (BID) | ORAL | Status: DC
  Administered 2023-07-14 – 2023-07-19 (×10): 5 mL via ORAL
  Filled 2023-07-14 (×10): qty 5

## 2023-07-14 MED ORDER — SENNOSIDES-DOCUSATE SODIUM 8.6-50 MG PO TABS
1.0000 | ORAL_TABLET | Freq: Two times a day (BID) | ORAL | Status: DC
  Administered 2023-07-14 – 2023-07-19 (×11): 1 via ORAL
  Filled 2023-07-14 (×11): qty 1

## 2023-07-14 MED ORDER — OXYCODONE HCL 5 MG PO TABS
7.5000 mg | ORAL_TABLET | Freq: Four times a day (QID) | ORAL | Status: DC | PRN
  Administered 2023-07-14 – 2023-07-17 (×11): 7.5 mg via ORAL
  Filled 2023-07-14 (×11): qty 2

## 2023-07-14 MED ORDER — POLYETHYLENE GLYCOL 3350 17 G PO PACK
17.0000 g | PACK | Freq: Every day | ORAL | Status: DC
Start: 2023-07-14 — End: 2023-07-16
  Administered 2023-07-14 – 2023-07-15 (×2): 17 g via ORAL
  Filled 2023-07-14 (×2): qty 1
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
## 2023-07-14 NOTE — Discharge Instructions (Signed)
 FOOD PANTRY [employer] [facility] [address] [phone]  [facility] [address] [place] [phone]  [facility] - Food Distribution Center [address] [place] [phone]  [facility] [address] [phone]  [facility] - Food Distribution Center [address] [place], [place] [address] [phone]  UTILITIES [facility] [address] [place] [phone] Rental assistance/rental hotline: [phone] ext. [phone].    Utility assistance/utility hotline: [phone] ext. [phone] Department of IT consultant (heating/cooling and water assistance) [phone] (rental and utility assistance) [phone]  [employer] - call 211  TRANSPORTATION [facility] [address] [place], [place] [address] [phone]  [employer] [place] I-Ride Reservations Line: [phone]     Passengers can simply call the [employer] reservations number at [phone] for pickup. However, with [employer] same-day service is available with at least two hour notice Monday through Friday. You will need your [employer] [place] client ID# when you call for reservations. If you do not know it, you can call [phone] to request it. [employer] offers a flat fare of $8.50 per trip. This will cover travel anywhere within the city limits of [place].

## 2023-07-14 NOTE — Plan of Care (Signed)

## 2023-07-14 NOTE — Progress Notes (Signed)
   07/14/23 1158  TOC Brief Assessment  Insurance and Status Reviewed  Patient has primary care physician Yes  Home environment has been reviewed Home w/ spouse  Prior level of function: Independent  Prior/Current Home Services No current home services  Social Drivers of Health Review SDOH reviewed interventions complete  Readmission risk has been reviewed Yes  Transition of care needs no transition of care needs at this time    SDOH Interventions Today    Flowsheet Row Most Recent Value  SDOH Interventions   Food Insecurity Interventions Inpatient TOC, Community Resources Provided, [id number] Referral  Transportation Interventions Inpatient TOC, [id number] Referral, Community Resources Provided

## 2023-07-14 NOTE — Plan of Care (Signed)
  Problem: Pain Managment: Goal: General experience of comfort will improve and/or be controlled Outcome: Progressing   Problem: Safety: Goal: Ability to remain free from injury will improve Outcome: Progressing   Problem: Nutrition: Goal: Adequate nutrition will be maintained Outcome: Progressing

## 2023-07-14 NOTE — Progress Notes (Signed)
PROGRESS NOTE  Troy Mclaughlin  FAO:[id number] DOB: 12/28/1955 DOA: 07/13/2023 PCP: Ivonne Andrew, NP   Brief Narrative:  Patient is a 68 year old male with history of  type 2 diabetes, PUD/gastric ulcer, GERD, hypertension, coronary artery  disease, stage IV prostate Cancer, TIA who presented with complaint of fatigue, malaise, productive cough, shortness of breath, congestion from home.  On presentation, he was hemodynamically stable.  COVID/flu/RSV negative.  Lab work showed severe hypokalemia of 2.6, magnesium of 1.6.  Chest x-ray showed patchy density throughout left lung suggesting extensive left-sided pneumonia.  He has been saturating well on room air.  Patient admitted for the management of CAP  Assessment & Plan:  Principal Problem:   CAP (community acquired pneumonia) Active Problems:   Essential hypertension   Hypokalemia   GLAUCOMA   Coronary artery disease   Abdominal wall pain   Type 2 diabetes mellitus without complication, without long-term current use of insulin (HCC)   Gastroesophageal reflux disease with esophagitis   Hyperlipidemia   Protein-calorie malnutrition, severe   Malignant neoplasm of prostate (HCC)   Anemia of chronic disease   Cancer associated pain   Hypomagnesemia   Viral URI  Community-acquired pneumonia: Presented with weakness, fatigue, malaise, cough, shortness of breath, congestion.Chest x-ray showed patchy density throughout left lung suggesting extensive left-sided pneumonia.  He has been saturating well on room air.  Started on ceftriaxone, azithromycin.  Follow-up streptococcal pneumoniae, Legionella urine antigen.  Follow-up with sputum culture, blood culture.  Currently afebrile, no leukocytosis, on room air.  Continue bronchodilators as needed, incentive spirometer  Metastatic prostate cancer/cancer related pain: Follows with oncology.  Status post EBRT, radiation treatment.  Currently on darolutamide.  Oncology has recommended palliative  care referral.  Takes oxycodone, Tylenol at home.  Hypokalemia/hypomagnesemia: Currently being monitored and supplemented as needed  Hypertension: On amlodipine, Coreg at home.  Continue.  Mildly hypertensive this morning.  Continue as needed medication for severe hypertension  Coronary artery disease: No anginal symptoms.  Continue current medications  Type 2 diabetes: Currently on sliding scale.  Monitor blood sugars  Hyperlipidemia:On  Lipitor  Vitamin B12 deficiency: On supplementation  Severe protein calorie malnutrition: In the setting of malignancy.  Nutritionist consulted  Anemia of chronic disease: Currently hemoglobin stable.  Goals of care: Multiple comorbidities including metastatic prostate cancer.  Oncology has recommended palliative care referral.       DVT prophylaxis:SCDs Start: 07/13/23 1645     Code Status: Full Code  Family Communication: None at bedside  Patient status:Inpatient  Patient is from :Home  Anticipated discharge ON:GEXB  Estimated DC date:1-2 days   Consultants: None  Procedures:None  Antimicrobials:  Anti-infectives (From admission, onward)    Start     Dose/Rate Route Frequency Ordered Stop   07/13/23 1515  azithromycin (ZITHROMAX) 500 mg in sodium chloride 0.9 % 250 mL IVPB        500 mg 250 mL/hr over 60 Minutes Intravenous Every 24 hours 07/13/23 1514     07/13/23 1515  cefTRIAXone (ROCEPHIN) 1 g in sodium chloride 0.9 % 100 mL IVPB        1 g 200 mL/hr over 30 Minutes Intravenous Every 24 hours 07/13/23 1514         Subjective: Patient seen and examined the bedside today.  Hemodynamically stable.  Lying on bed.  On room air.  Complains of some cough and pain on the anterior left lower ribs with area of metastatic disease.  Speaking in full sentences.  Not coughing that much but says that he does not feel good because of pain.  Objective: Vitals:   07/13/23 2217 07/14/23 0210 07/14/23 0509 07/14/23 0513  BP: (!)  158/89 (!) 159/96 (!) 182/91 (!) 157/92  Pulse: 63 66 67 73  Resp: 18 16 18 18   Temp: 97.9 F (36.6 C) 98.3 F (36.8 C) 98.1 F (36.7 C)   TempSrc: Oral  Oral   SpO2: 98% 99% 98% 97%  Weight:      Height:        Intake/Output Summary (Last 24 hours) at 07/14/2023 0737 Last data filed at 07/14/2023 0559 Gross per 24 hour  Intake 587.72 ml  Output 320 ml  Net 267.72 ml   Filed Weights   07/13/23 1829  Weight: 59.3 kg    Examination:  General exam: Overall comfortable, not in distress HEENT: PERRL Respiratory system:  no wheezes or crackles, diminished air sounds/crackles on the left side Cardiovascular system: S1 & S2 heard, RRR.  Gastrointestinal system: Abdomen is nondistended, soft and nontender. Central nervous system: Alert and oriented Extremities: No edema, no clubbing ,no cyanosis Skin: No rashes, no ulcers,no icterus     Data Reviewed: I have personally reviewed following labs and imaging studies  CBC: Recent Labs  Lab 07/13/23 1458 07/13/23 1556  WBC 7.4  --   HGB 10.6* 9.2*  HCT 32.9* 27.0*  MCV 78.5*  --   PLT 421*  --    Basic Metabolic Panel: Recent Labs  Lab 07/13/23 1546 07/13/23 1556  NA 137 140  K 2.6* 2.7*  CL 107 106  CO2 21*  --   GLUCOSE 102* 97  BUN 11 9  CREATININE 0.71 0.70  CALCIUM 7.1*  --   MG 1.6*  --   PHOS 2.5  --      Recent Results (from the past 240 hours)  Resp panel by RT-PCR (RSV, Flu A&B, Covid) Anterior Nasal Swab     Status: None   Collection Time: 07/13/23  2:58 PM   Specimen: Anterior Nasal Swab  Result Value Ref Range Status   SARS Coronavirus 2 by RT PCR NEGATIVE NEGATIVE Final    Comment: (NOTE) SARS-CoV-2 target nucleic acids are NOT DETECTED.  The SARS-CoV-2 RNA is generally detectable in upper respiratory specimens during the acute phase of infection. The lowest concentration of SARS-CoV-2 viral copies this assay can detect is 138 copies/mL. A negative result does not preclude  SARS-Cov-2 infection and should not be used as the sole basis for treatment or other patient management decisions. A negative result may occur with  improper specimen collection/handling, submission of specimen other than nasopharyngeal swab, presence of viral mutation(s) within the areas targeted by this assay, and inadequate number of viral copies(<138 copies/mL). A negative result must be combined with clinical observations, patient history, and epidemiological information. The expected result is Negative.  Fact Sheet for Patients:  BloggerCourse.com  Fact Sheet for Healthcare Providers:  SeriousBroker.it  This test is no t yet approved or cleared by the [place] FDA and  has been authorized for detection and/or diagnosis of SARS-CoV-2 by FDA under an Emergency Use Authorization (EUA). This EUA will remain  in effect (meaning this test can be used) for the duration of the COVID-19 declaration under Section 564(b)(1) of the Act, 21 U.S.C.section 360bbb-3(b)(1), unless the authorization is terminated  or revoked sooner.       Influenza A by PCR NEGATIVE NEGATIVE Final   Influenza B by PCR NEGATIVE NEGATIVE  Final    Comment: (NOTE) The Xpert Xpress SARS-CoV-2/FLU/RSV plus assay is intended as an aid in the diagnosis of influenza from Nasopharyngeal swab specimens and should not be used as a sole basis for treatment. Nasal washings and aspirates are unacceptable for Xpert Xpress SARS-CoV-2/FLU/RSV testing.  Fact Sheet for Patients: BloggerCourse.com  Fact Sheet for Healthcare Providers: SeriousBroker.it  This test is not yet approved or cleared by the [place] FDA and has been authorized for detection and/or diagnosis of SARS-CoV-2 by FDA under an Emergency Use Authorization (EUA). This EUA will remain in effect (meaning this test can be used) for the duration of  the COVID-19 declaration under Section 564(b)(1) of the Act, 21 U.S.C. section 360bbb-3(b)(1), unless the authorization is terminated or revoked.     Resp Syncytial Virus by PCR NEGATIVE NEGATIVE Final    Comment: (NOTE) Fact Sheet for Patients: BloggerCourse.com  Fact Sheet for Healthcare Providers: SeriousBroker.it  This test is not yet approved or cleared by the [place] FDA and has been authorized for detection and/or diagnosis of SARS-CoV-2 by FDA under an Emergency Use Authorization (EUA). This EUA will remain in effect (meaning this test can be used) for the duration of the COVID-19 declaration under Section 564(b)(1) of the Act, 21 U.S.C. section 360bbb-3(b)(1), unless the authorization is terminated or revoked.  Performed at [facility], [address] [place], [place] [address]      Radiology Studies: [facility] Chest Port 1 View Result Date: 07/13/2023 CLINICAL DATA:  Flu-like symptoms with cough and congestion. Undergoing treatment for colon cancer. EXAM: PORTABLE CHEST 1 VIEW COMPARISON:  01/12/2023 FINDINGS: Patchy density throughout the majority of the left lung. Clear right lung. Normal-sized heart. No acute bony abnormality. IMPRESSION: Extensive left lung pneumonia Electronically Signed   By: Beckie Salts M.D.   On: 07/13/2023 15:08    Scheduled Meds:  amLODipine  10 mg Oral q morning   carvedilol  6.25 mg Oral BID WC   cyanocobalamin  1,000 mcg Oral Daily   feeding supplement  237 mL Oral BID BM   pantoprazole  40 mg Oral Daily   Continuous Infusions:  azithromycin Stopped (07/13/23 1721)   cefTRIAXone (ROCEPHIN)  IV Stopped (07/13/23 1721)     LOS: 1 day   Burnadette Pop, MD [facility] P2/21/2025, 7:37 AM

## 2023-07-15 DIAGNOSIS — J189 Pneumonia, unspecified organism: Secondary | ICD-10-CM | POA: Diagnosis not present

## 2023-07-15 LAB — BLOOD CULTURE ID PANEL (REFLEXED) - BCID2

## 2023-07-15 LAB — CBC
HCT: 31.3 % — ABNORMAL LOW (ref 39.0–52.0)
Hemoglobin: 10.5 g/dL — ABNORMAL LOW (ref 13.0–17.0)
MCH: 25.7 pg — ABNORMAL LOW (ref 26.0–34.0)
MCHC: 33.5 g/dL (ref 30.0–36.0)
MCV: 76.7 fL — ABNORMAL LOW (ref 80.0–100.0)
Platelets: 381 10*3/uL (ref 150–400)
RBC: 4.08 MIL/uL — ABNORMAL LOW (ref 4.22–5.81)
RDW: 14.4 % (ref 11.5–15.5)
WBC: 6.1 10*3/uL (ref 4.0–10.5)
nRBC: 0 % (ref 0.0–0.2)

## 2023-07-15 LAB — BASIC METABOLIC PANEL
Anion gap: 10 (ref 5–15)
BUN: 10 mg/dL (ref 8–23)
CO2: 24 mmol/L (ref 22–32)
Calcium: 8.7 mg/dL — ABNORMAL LOW (ref 8.9–10.3)
Chloride: 99 mmol/L (ref 98–111)
Creatinine, Ser: 0.67 mg/dL (ref 0.61–1.24)
GFR, Estimated: >60 mL/min (ref >60)
Glucose, Bld: 92 mg/dL (ref 70–99)
Potassium: 4.5 mmol/L (ref 3.5–5.1)
Sodium: 133 mmol/L — ABNORMAL LOW (ref 135–145)

## 2023-07-15 LAB — GLUCOSE, CAPILLARY
Glucose-Capillary: 83 mg/dL (ref 70–99)
Glucose-Capillary: 130 mg/dL — ABNORMAL HIGH (ref 70–99)
Glucose-Capillary: 111 mg/dL — ABNORMAL HIGH (ref 70–99)
Glucose-Capillary: 107 mg/dL — ABNORMAL HIGH (ref 70–99)

## 2023-07-15 LAB — PSA: Prostatic Specific Antigen: 104.61 ng/mL — ABNORMAL HIGH (ref 0.00–4.00)

## 2023-07-15 MED ORDER — BISACODYL 10 MG RE SUPP
10.0000 mg | Freq: Once | RECTAL | Status: DC
  Filled 2023-07-15: qty 1

## 2023-07-15 MED ORDER — CARVEDILOL 12.5 MG PO TABS
12.5000 mg | ORAL_TABLET | Freq: Two times a day (BID) | ORAL | Status: DC
  Administered 2023-07-15 – 2023-07-19 (×8): 12.5 mg via ORAL
  Filled 2023-07-15 (×8): qty 1

## 2023-07-15 NOTE — Progress Notes (Signed)
 PROGRESS NOTE  Troy Mclaughlin  NFA:[id number] DOB: 08-10-1955 DOA: 07/13/2023 PCP: Ivonne Andrew, NP   Brief Narrative:  Patient is a 68 year old male with history of  type 2 diabetes, PUD/gastric ulcer, GERD, hypertension, coronary artery  disease, stage IV prostate Cancer, TIA who presented with complaint of fatigue, malaise, productive cough, shortness of breath, congestion from home.  On presentation, he was hemodynamically stable.  COVID/flu/RSV negative.  Lab work showed severe hypokalemia of 2.6, magnesium of 1.6.  Chest x-ray showed patchy density throughout left lung suggesting extensive left-sided pneumonia.  He has been saturating well on room air.  Patient admitted for the management of CAP.  Hospital course remarkable for cough.  Does not feel ready to go home yet.  Assessment & Plan:  Principal Problem:   CAP (community acquired pneumonia) Active Problems:   Essential hypertension   Hypokalemia   GLAUCOMA   Coronary artery disease   Abdominal wall pain   Type 2 diabetes mellitus without complication, without long-term current use of insulin (HCC)   Gastroesophageal reflux disease with esophagitis   Hyperlipidemia   Protein-calorie malnutrition, severe   Malignant neoplasm of prostate (HCC)   Anemia of chronic disease   Cancer associated pain   Hypomagnesemia   Viral URI  Community-acquired pneumonia: Presented with weakness, fatigue, malaise, cough, shortness of breath, congestion.Chest x-ray showed patchy density throughout left lung suggesting extensive left-sided pneumonia.  He has been saturating well on room air.  Started on ceftriaxone, azithromycin. Negative streptococcal pneumoniae.  Follow-up blood culture:NGTD.  Currently afebrile, no leukocytosis, on room air.  Continue bronchodilators as needed, incentive spirometer.  Bothered by cough  Metastatic prostate cancer/cancer related pain: Follows with oncology.  Status post EBRT, radiation treatment.  Currently  on darolutamide.  Oncology has recommended palliative care referral.  Takes oxycodone, Tylenol at home.  Hypokalemia/hypomagnesemia: Currently being monitored and supplemented as needed  Hypertension: On amlodipine, Coreg at home.  Continue.  Mildly hypertensive this morning.  Increased the dose of Coreg  Coronary artery disease: No anginal symptoms.  Continue current medications  Type 2 diabetes: Currently on sliding scale.  Monitor blood sugars  Hyperlipidemia:On  Lipitor  Vitamin B12 deficiency: On supplementation  Anemia of chronic disease: Currently hemoglobin stable.  Constipation: Continue bowel regimen  Goals of care: Multiple comorbidities including metastatic prostate cancer.  Oncology has recommended palliative care referral.       DVT prophylaxis:SCDs Start: 07/13/23 1645     Code Status: Full Code  Family Communication: None at bedside  Patient status:Inpatient  Patient is from :Home  Anticipated discharge IO:[facility]  Estimated DC date:tomorrow   Consultants: None  Procedures:None  Antimicrobials:  Anti-infectives (From admission, onward)    Start     Dose/Rate Route Frequency Ordered Stop   07/14/23 1500  cefTRIAXone (ROCEPHIN) 2 g in sodium chloride 0.9 % 100 mL IVPB        2 g 200 mL/hr over 30 Minutes Intravenous Every 24 hours 07/14/23 0741     07/13/23 1515  azithromycin (ZITHROMAX) 500 mg in sodium chloride 0.9 % 250 mL IVPB        500 mg 250 mL/hr over 60 Minutes Intravenous Every 24 hours 07/13/23 1514     07/13/23 1515  cefTRIAXone (ROCEPHIN) 1 g in sodium chloride 0.9 % 100 mL IVPB  Status:  Discontinued        1 g 200 mL/hr over 30 Minutes Intravenous Every 24 hours 07/13/23 1514 07/14/23 0741  Subjective: Patient seen and examined at bedside today.  Hemodynamically stable.  Very comfortable.  Talking on the phone when I arrived.  But he still complains of cough and does not feel ready to go home today.  Complains of anterior  left-sided lower rib pain which is likely  the chronic pain from metastatic cancer  Objective: Vitals:   07/14/23 2119 07/15/23 0407 07/15/23 0408 07/15/23 0828  BP: (!) 149/90 (!) 124/107 (!) 150/99 (!) 164/79  Pulse: 78 77 72 77  Resp:  20    Temp:  98.6 F (37 C)    TempSrc:  Oral    SpO2: 98% 95% 97%   Weight:      Height:        Intake/Output Summary (Last 24 hours) at 07/15/2023 1043 Last data filed at 07/15/2023 0839 Gross per 24 hour  Intake 781.25 ml  Output 850 ml  Net -68.75 ml   Filed Weights   07/13/23 1829  Weight: 59.3 kg    Examination:  General exam: Overall comfortable, not in distress HEENT: PERRL Respiratory system:  no wheezes or crackles , mildly diminished sounds on the left side Cardiovascular system: S1 & S2 heard, RRR.  Gastrointestinal system: Abdomen is nondistended, soft and nontender. Central nervous system: Alert and oriented Extremities: No edema, no clubbing ,no cyanosis Skin: No rashes, no ulcers,no icterus     Data Reviewed: I have personally reviewed following labs and imaging studies  CBC: Recent Labs  Lab 07/13/23 1458 07/13/23 1556 07/14/23 0535 07/15/23 0754  WBC 7.4  --  5.2 6.1  HGB 10.6* 9.2* 10.0* 10.5*  HCT 32.9* 27.0* 29.8* 31.3*  MCV 78.5*  --  77.0* 76.7*  PLT 421*  --  362 381   Basic Metabolic Panel: Recent Labs  Lab 07/13/23 1546 07/13/23 1556 07/14/23 0535 07/15/23 0754  NA 137 140 133* 133*  K 2.6* 2.7* 3.8 4.5  CL 107 106 101 99  CO2 21*  --  22 24  GLUCOSE 102* 97 101* 92  BUN 11 9 11 10   CREATININE 0.71 0.70 0.66 0.67  CALCIUM 7.1*  --  8.3* 8.7*  MG 1.6*  --   --   --   PHOS 2.5  --   --   --      Recent Results (from the past 240 hours)  Resp panel by RT-PCR (RSV, Flu A&B, Covid) Anterior Nasal Swab     Status: None   Collection Time: 07/13/23  2:58 PM   Specimen: Anterior Nasal Swab  Result Value Ref Range Status   SARS Coronavirus 2 by RT PCR NEGATIVE NEGATIVE Final    Comment:  (NOTE) SARS-CoV-2 target nucleic acids are NOT DETECTED.  The SARS-CoV-2 RNA is generally detectable in upper respiratory specimens during the acute phase of infection. The lowest concentration of SARS-CoV-2 viral copies this assay can detect is 138 copies/mL. A negative result does not preclude SARS-Cov-2 infection and should not be used as the sole basis for treatment or other patient management decisions. A negative result may occur with  improper specimen collection/handling, submission of specimen other than nasopharyngeal swab, presence of viral mutation(s) within the areas targeted by this assay, and inadequate number of viral copies(<138 copies/mL). A negative result must be combined with clinical observations, patient history, and epidemiological information. The expected result is Negative.  Fact Sheet for Patients:  BloggerCourse.com  Fact Sheet for Healthcare Providers:  SeriousBroker.it  This test is no t yet approved or cleared by  the [employer] and  has been authorized for detection and/or diagnosis of SARS-CoV-2 by FDA under an Emergency Use Authorization (EUA). This EUA will remain  in effect (meaning this test can be used) for the duration of the COVID-19 declaration under Section 564(b)(1) of the Act, 21 U.S.C.section 360bbb-3(b)(1), unless the authorization is terminated  or revoked sooner.       Influenza A by PCR NEGATIVE NEGATIVE Final   Influenza B by PCR NEGATIVE NEGATIVE Final    Comment: (NOTE) The Xpert Xpress SARS-CoV-2/FLU/RSV plus assay is intended as an aid in the diagnosis of influenza from Nasopharyngeal swab specimens and should not be used as a sole basis for treatment. Nasal washings and aspirates are unacceptable for Xpert Xpress SARS-CoV-2/FLU/RSV testing.  Fact Sheet for Patients: BloggerCourse.com  Fact Sheet for Healthcare  Providers: SeriousBroker.it  This test is not yet approved or cleared by the [place] FDA and has been authorized for detection and/or diagnosis of SARS-CoV-2 by FDA under an Emergency Use Authorization (EUA). This EUA will remain in effect (meaning this test can be used) for the duration of the COVID-19 declaration under Section 564(b)(1) of the Act, 21 U.S.C. section 360bbb-3(b)(1), unless the authorization is terminated or revoked.     Resp Syncytial Virus by PCR NEGATIVE NEGATIVE Final    Comment: (NOTE) Fact Sheet for Patients: BloggerCourse.com  Fact Sheet for Healthcare Providers: SeriousBroker.it  This test is not yet approved or cleared by the [place] FDA and has been authorized for detection and/or diagnosis of SARS-CoV-2 by FDA under an Emergency Use Authorization (EUA). This EUA will remain in effect (meaning this test can be used) for the duration of the COVID-19 declaration under Section 564(b)(1) of the Act, 21 U.S.C. section 360bbb-3(b)(1), unless the authorization is terminated or revoked.  Performed at [facility], [address], [place], [place] [address]   Culture, blood (Routine X 2) w Reflex to ID Panel     Status: None (Preliminary result)   Collection Time: 07/13/23  3:34 PM   Specimen: BLOOD  Result Value Ref Range Status   Specimen Description   Final    BLOOD SPECIMEN SOURCE NOT MARKED ON REQUISITION Performed at [facility], [address] [place], [place] [address]    Special Requests   Final    BOTTLES DRAWN AEROBIC AND ANAEROBIC Blood Culture adequate volume Performed at [facility], [address], [place], [place] [address]    Culture   Final    NO GROWTH 2 DAYS Performed at [facility], [address] [place], [place] [address]    Report Status PENDING  Incomplete  Culture, blood  (Routine X 2) w Reflex to ID Panel     Status: None (Preliminary result)   Collection Time: 07/13/23  3:57 PM   Specimen: BLOOD  Result Value Ref Range Status   Specimen Description   Final    BLOOD SPECIMEN SOURCE NOT MARKED ON REQUISITION Performed at [facility], [address], [place], [place] [address]    Special Requests   Final    BOTTLES DRAWN AEROBIC AND ANAEROBIC Blood Culture adequate volume Performed at [facility], [address], [place], [place] [address]    Culture   Final    NO GROWTH 2 DAYS Performed at [facility], [address], [place], [place] [address]    Report Status PENDING  Incomplete     Radiology  Studies: DG Chest Port 1 View Result Date: 07/13/2023 CLINICAL DATA:  Flu-like symptoms with cough and congestion. Undergoing treatment for colon cancer. EXAM: PORTABLE CHEST 1 VIEW COMPARISON:  01/12/2023 FINDINGS: Patchy density throughout the majority of the left lung. Clear right lung. Normal-sized heart. No acute bony abnormality. IMPRESSION: Extensive left lung pneumonia Electronically Signed   By: Beckie Salts M.D.   On: 07/13/2023 15:08    Scheduled Meds:  amLODipine  10 mg Oral q morning   carvedilol  6.25 mg Oral BID WC   chlorpheniramine-HYDROcodone  5 mL Oral Q12H   cyanocobalamin  1,000 mcg Oral Daily   feeding supplement  237 mL Oral BID BM   guaiFENesin  600 mg Oral BID   lidocaine  1 patch Transdermal Q24H   pantoprazole  40 mg Oral Daily   polyethylene glycol  17 g Oral Daily   senna-docusate  1 tablet Oral BID   Continuous Infusions:  azithromycin 500 mg (07/14/23 1522)   cefTRIAXone (ROCEPHIN)  IV 2 g (07/14/23 1415)     LOS: 2 days   Burnadette Pop, MD [facility] P2/22/2025, 10:43 AM

## 2023-07-15 NOTE — Progress Notes (Signed)
 PHARMACY - PHYSICIAN COMMUNICATION CRITICAL VALUE ALERT - BLOOD CULTURE IDENTIFICATION (BCID)  Troy Mclaughlin is an 68 y.o. male who presented to [facility] on 07/13/2023 with a chief complaint of worsening URI/viral syndrome symptoms with fatigue, malaise, productive cough, dyspnea, chest congestion.   Assessment: currently on antimicrobials for CAP. Per Micro lab, blood cultures 1/4 bottles with gram positive cocci - no organism detected on BCID.   Name of physician (or Provider) Contacted: Dr. Renford Dills   Current antibiotics: Ceftriaxone, Azithromycin   Changes to prescribed antibiotics recommended:  Per Dr. Renford Dills, continue current antibiotics. He suspects the GPC is a contaminant.   Results for orders placed or performed during the hospital encounter of 07/13/23  Blood Culture ID Panel (Reflexed) (Collected: 07/13/2023  3:34 PM)  Result Value Ref Range   Enterococcus faecalis NOT DETECTED NOT DETECTED   Enterococcus Faecium NOT DETECTED NOT DETECTED   Listeria monocytogenes NOT DETECTED NOT DETECTED   Staphylococcus species NOT DETECTED NOT DETECTED   Staphylococcus aureus (BCID) NOT DETECTED NOT DETECTED   Staphylococcus epidermidis NOT DETECTED NOT DETECTED   Staphylococcus lugdunensis NOT DETECTED NOT DETECTED   Streptococcus species NOT DETECTED NOT DETECTED   Streptococcus agalactiae NOT DETECTED NOT DETECTED   Streptococcus pneumoniae NOT DETECTED NOT DETECTED   Streptococcus pyogenes NOT DETECTED NOT DETECTED   A.calcoaceticus-baumannii NOT DETECTED NOT DETECTED   Bacteroides fragilis NOT DETECTED NOT DETECTED   Enterobacterales NOT DETECTED NOT DETECTED   Enterobacter cloacae complex NOT DETECTED NOT DETECTED   Escherichia coli NOT DETECTED NOT DETECTED   Klebsiella aerogenes NOT DETECTED NOT DETECTED   Klebsiella oxytoca NOT DETECTED NOT DETECTED   Klebsiella pneumoniae NOT DETECTED NOT DETECTED   Proteus species NOT DETECTED NOT DETECTED   Salmonella species NOT  DETECTED NOT DETECTED   Serratia marcescens NOT DETECTED NOT DETECTED   Haemophilus influenzae NOT DETECTED NOT DETECTED   Neisseria meningitidis NOT DETECTED NOT DETECTED   Pseudomonas aeruginosa NOT DETECTED NOT DETECTED   Stenotrophomonas maltophilia NOT DETECTED NOT DETECTED   Candida albicans NOT DETECTED NOT DETECTED   Candida auris NOT DETECTED NOT DETECTED   Candida glabrata NOT DETECTED NOT DETECTED   Candida krusei NOT DETECTED NOT DETECTED   Candida parapsilosis NOT DETECTED NOT DETECTED   Candida tropicalis NOT DETECTED NOT DETECTED   Cryptococcus neoformans/gattii NOT DETECTED NOT DETECTED    [name] 07/15/2023  1:13 PM

## 2023-07-15 NOTE — Plan of Care (Signed)
   Problem: Nutrition: Goal: Adequate nutrition will be maintained Outcome: Progressing   Problem: Pain Managment: Goal: General experience of comfort will improve and/or be controlled Outcome: Progressing   Problem: Safety: Goal: Ability to remain free from injury will improve Outcome: Progressing

## 2023-07-16 ENCOUNTER — Inpatient Hospital Stay (HOSPITAL_COMMUNITY): Payer: 59

## 2023-07-16 DIAGNOSIS — J189 Pneumonia, unspecified organism: Secondary | ICD-10-CM | POA: Diagnosis not present

## 2023-07-16 LAB — GLUCOSE, CAPILLARY
Glucose-Capillary: 116 mg/dL — ABNORMAL HIGH (ref 70–99)
Glucose-Capillary: 124 mg/dL — ABNORMAL HIGH (ref 70–99)
Glucose-Capillary: 120 mg/dL — ABNORMAL HIGH (ref 70–99)
Glucose-Capillary: 128 mg/dL — ABNORMAL HIGH (ref 70–99)

## 2023-07-16 LAB — LEGIONELLA PNEUMOPHILA SEROGP 1 UR AG: L. pneumophila Serogp 1 Ur Ag: NEGATIVE

## 2023-07-16 MED ORDER — POLYETHYLENE GLYCOL 3350 17 G PO PACK
17.0000 g | PACK | Freq: Two times a day (BID) | ORAL | Status: DC
  Administered 2023-07-16 – 2023-07-18 (×5): 17 g via ORAL
  Filled 2023-07-16 (×5): qty 1

## 2023-07-16 MED ORDER — IOHEXOL 300 MG/ML  SOLN
100.0000 mL | Freq: Once | INTRAMUSCULAR | Status: AC | PRN
Start: 2023-07-16 — End: 2023-07-16
  Administered 2023-07-16: 100 mL via INTRAVENOUS

## 2023-07-16 MED ORDER — FLEET ENEMA RE ENEM
1.0000 | ENEMA | Freq: Once | RECTAL | Status: DC
  Filled 2023-07-16: qty 1

## 2023-07-16 NOTE — Progress Notes (Signed)
 PROGRESS NOTE  Troy Mclaughlin  EAV:[id number] DOB: 1955-11-29 DOA: 07/13/2023 PCP: Ivonne Andrew, NP   Brief Narrative:  Patient is a 68 year old male with history of  type 2 diabetes, PUD/gastric ulcer, GERD, hypertension, coronary artery  disease, stage IV prostate Cancer, TIA who presented with complaint of fatigue, malaise, productive cough, shortness of breath, congestion from home.  On presentation, he was hemodynamically stable.  COVID/flu/RSV negative.  Lab work showed severe hypokalemia of 2.6, magnesium of 1.6.  Chest x-ray showed patchy density throughout left lung suggesting extensive left-sided pneumonia.  He has been saturating well on room air.  Patient admitted for the management of CAP.  Hospital course remarkable for cough.  Does not feel ready to go home yet.  Assessment & Plan:  Principal Problem:   CAP (community acquired pneumonia) Active Problems:   Essential hypertension   Hypokalemia   GLAUCOMA   Coronary artery disease   Abdominal wall pain   Type 2 diabetes mellitus without complication, without long-term current use of insulin (HCC)   Gastroesophageal reflux disease with esophagitis   Hyperlipidemia   Protein-calorie malnutrition, severe   Malignant neoplasm of prostate (HCC)   Anemia of chronic disease   Cancer associated pain   Hypomagnesemia   Viral URI  Community-acquired pneumonia: Presented with weakness, fatigue, malaise, cough, shortness of breath, congestion.Chest x-ray showed patchy density throughout left lung suggesting extensive left-sided pneumonia.  He has been saturating well on room air.  Started on ceftriaxone, azithromycin. Negative streptococcal pneumoniae.  Follow-up blood culture:NGTD.  Currently afebrile, no leukocytosis, on room air.  Continue bronchodilators as needed, incentive spirometer.  Cought better today  Metastatic prostate cancer/cancer related pain: Follows with oncology.  Status post EBRT, radiation treatment.  Currently  on darolutamide.  Oncology has recommended palliative care referral.  Takes oxycodone, Tylenol at home.  Oncology recommended palliative care consultation here  Hypokalemia/hypomagnesemia: Currently being monitored and supplemented as needed  Hypertension: On amlodipine, Coreg at home.    Increased the dose of Coreg.BP stable now  Coronary artery disease: No anginal symptoms.  Continue current medications  Type 2 diabetes: Currently on sliding scale.  Monitor blood sugars  Hyperlipidemia:On  Lipitor  Vitamin B12 deficiency: On supplementation  Anemia of chronic disease: Currently hemoglobin stable.  Constipation: Continue bowel regimen.  Continue MiraLAX, Senokot, ordered a dose of Fleet enema today  Goals of care: Multiple comorbidities including metastatic prostate cancer.  Oncology has recommended palliative care consultation       DVT prophylaxis:SCDs Start: 07/13/23 1645     Code Status: Full Code  Family Communication: None at bedside  Patient status:Inpatient  Patient is from :Home  Anticipated discharge NW:GNFA  Estimated DC date:tomorrow   Consultants: None  Procedures:None  Antimicrobials:  Anti-infectives (From admission, onward)    Start     Dose/Rate Route Frequency Ordered Stop   07/14/23 1500  cefTRIAXone (ROCEPHIN) 2 g in sodium chloride 0.9 % 100 mL IVPB        2 g 200 mL/hr over 30 Minutes Intravenous Every 24 hours 07/14/23 0741     07/13/23 1515  azithromycin (ZITHROMAX) 500 mg in sodium chloride 0.9 % 250 mL IVPB        500 mg 250 mL/hr over 60 Minutes Intravenous Every 24 hours 07/13/23 1514     07/13/23 1515  cefTRIAXone (ROCEPHIN) 1 g in sodium chloride 0.9 % 100 mL IVPB  Status:  Discontinued        1 g 200 mL/hr over  30 Minutes Intravenous Every 24 hours 07/13/23 1514 07/14/23 0741       Subjective: Patient seen and examined at bedside today.  He looks more comfortable than yesterday.  Cough is better.  On room air.  No bowel  movement yet for last few days.  Denies any abdomen pain  Objective: Vitals:   07/15/23 0828 07/15/23 1820 07/15/23 2158 07/16/23 0617  BP: (!) 164/79 137/84 133/82 132/83  Pulse: 77 79 79 74  Resp:  (!) 21 18 15   Temp:  100 F (37.8 C) 98.7 F (37.1 C) 98.2 F (36.8 C)  TempSrc:  Oral Oral Oral  SpO2:  95% 93% 95%  Weight:      Height:        Intake/Output Summary (Last 24 hours) at 07/16/2023 1035 Last data filed at 07/16/2023 3244 Gross per 24 hour  Intake 240 ml  Output 750 ml  Net -510 ml   Filed Weights   07/13/23 1829  Weight: 59.3 kg    Examination:   General exam: Overall comfortable, not in distress HEENT: PERRL Respiratory system:  no wheezes or crackles , mildly diminished sounds on the left side Cardiovascular system: S1 & S2 heard, RRR.  Gastrointestinal system: Abdomen is nondistended, soft and nontender. Central nervous system: Alert and oriented Extremities: No edema, no clubbing ,no cyanosis Skin: No rashes, no ulcers,no icterus     Data Reviewed: I have personally reviewed following labs and imaging studies  CBC: Recent Labs  Lab 07/13/23 1458 07/13/23 1556 07/14/23 0535 07/15/23 0754  WBC 7.4  --  5.2 6.1  HGB 10.6* 9.2* 10.0* 10.5*  HCT 32.9* 27.0* 29.8* 31.3*  MCV 78.5*  --  77.0* 76.7*  PLT 421*  --  362 381   Basic Metabolic Panel: Recent Labs  Lab 07/13/23 1546 07/13/23 1556 07/14/23 0535 07/15/23 0754  NA 137 140 133* 133*  K 2.6* 2.7* 3.8 4.5  CL 107 106 101 99  CO2 21*  --  22 24  GLUCOSE 102* 97 101* 92  BUN 11 9 11 10   CREATININE 0.71 0.70 0.66 0.67  CALCIUM 7.1*  --  8.3* 8.7*  MG 1.6*  --   --   --   PHOS 2.5  --   --   --      Recent Results (from the past 240 hours)  Resp panel by RT-PCR (RSV, Flu A&B, Covid) Anterior Nasal Swab     Status: None   Collection Time: 07/13/23  2:58 PM   Specimen: Anterior Nasal Swab  Result Value Ref Range Status   SARS Coronavirus 2 by RT PCR NEGATIVE NEGATIVE Final     Comment: (NOTE) SARS-CoV-2 target nucleic acids are NOT DETECTED.  The SARS-CoV-2 RNA is generally detectable in upper respiratory specimens during the acute phase of infection. The lowest concentration of SARS-CoV-2 viral copies this assay can detect is 138 copies/mL. A negative result does not preclude SARS-Cov-2 infection and should not be used as the sole basis for treatment or other patient management decisions. A negative result may occur with  improper specimen collection/handling, submission of specimen other than nasopharyngeal swab, presence of viral mutation(s) within the areas targeted by this assay, and inadequate number of viral copies(<138 copies/mL). A negative result must be combined with clinical observations, patient history, and epidemiological information. The expected result is Negative.  Fact Sheet for Patients:  BloggerCourse.com  Fact Sheet for Healthcare Providers:  SeriousBroker.it  This test is no t yet approved or  cleared by the [place] and  has been authorized for detection and/or diagnosis of SARS-CoV-2 by FDA under an Emergency Use Authorization (EUA). This EUA will remain  in effect (meaning this test can be used) for the duration of the COVID-19 declaration under Section 564(b)(1) of the Act, 21 U.S.C.section 360bbb-3(b)(1), unless the authorization is terminated  or revoked sooner.       Influenza A by PCR NEGATIVE NEGATIVE Final   Influenza B by PCR NEGATIVE NEGATIVE Final    Comment: (NOTE) The Xpert Xpress SARS-CoV-2/FLU/RSV plus assay is intended as an aid in the diagnosis of influenza from Nasopharyngeal swab specimens and should not be used as a sole basis for treatment. Nasal washings and aspirates are unacceptable for Xpert Xpress SARS-CoV-2/FLU/RSV testing.  Fact Sheet for Patients: BloggerCourse.com  Fact Sheet for Healthcare  Providers: SeriousBroker.it  This test is not yet approved or cleared by the [place] FDA and has been authorized for detection and/or diagnosis of SARS-CoV-2 by FDA under an Emergency Use Authorization (EUA). This EUA will remain in effect (meaning this test can be used) for the duration of the COVID-19 declaration under Section 564(b)(1) of the Act, 21 U.S.C. section 360bbb-3(b)(1), unless the authorization is terminated or revoked.     Resp Syncytial Virus by PCR NEGATIVE NEGATIVE Final    Comment: (NOTE) Fact Sheet for Patients: BloggerCourse.com  Fact Sheet for Healthcare Providers: SeriousBroker.it  This test is not yet approved or cleared by the [place] FDA and has been authorized for detection and/or diagnosis of SARS-CoV-2 by FDA under an Emergency Use Authorization (EUA). This EUA will remain in effect (meaning this test can be used) for the duration of the COVID-19 declaration under Section 564(b)(1) of the Act, 21 U.S.C. section 360bbb-3(b)(1), unless the authorization is terminated or revoked.  Performed at [facility], [address] [place], [place] [address]   Culture, blood (Routine X 2) w Reflex to ID Panel     Status: None (Preliminary result)   Collection Time: 07/13/23  3:34 PM   Specimen: BLOOD  Result Value Ref Range Status   Specimen Description   Final    BLOOD SPECIMEN SOURCE NOT MARKED ON REQUISITION Performed at [facility], [address], [place], [place] [address]    Special Requests   Final    BOTTLES DRAWN AEROBIC AND ANAEROBIC Blood Culture adequate volume Performed at [facility], [address] [place], [place] [address]    Culture  Setup Time   Final    GRAM POSITIVE COCCI ANAEROBIC BOTTLE ONLY Organism ID to follow CRITICAL RESULT CALLED TO, READ BACK BY AND VERIFIED WITH: Thana Ates  [id number] AT 1256 BY EC    Culture   Final    GRAM POSITIVE COCCI IDENTIFICATION TO FOLLOW Performed at [facility], [address] [place], [place] [address]    Report Status PENDING  Incomplete  Blood Culture ID Panel (Reflexed)     Status: None   Collection Time: 07/13/23  3:34 PM  Result Value Ref Range Status   Enterococcus faecalis NOT DETECTED NOT DETECTED Final   Enterococcus Faecium NOT DETECTED NOT DETECTED Final   Listeria monocytogenes NOT DETECTED NOT DETECTED Final   Staphylococcus species NOT DETECTED NOT DETECTED Final   Staphylococcus aureus (BCID) NOT DETECTED NOT DETECTED Final   Staphylococcus epidermidis NOT DETECTED NOT DETECTED Final   Staphylococcus lugdunensis NOT DETECTED NOT DETECTED Final   Streptococcus species NOT  DETECTED NOT DETECTED Final   Streptococcus agalactiae NOT DETECTED NOT DETECTED Final   Streptococcus pneumoniae NOT DETECTED NOT DETECTED Final   Streptococcus pyogenes NOT DETECTED NOT DETECTED Final   A.calcoaceticus-baumannii NOT DETECTED NOT DETECTED Final   Bacteroides fragilis NOT DETECTED NOT DETECTED Final   Enterobacterales NOT DETECTED NOT DETECTED Final   Enterobacter cloacae complex NOT DETECTED NOT DETECTED Final   Escherichia coli NOT DETECTED NOT DETECTED Final   Klebsiella aerogenes NOT DETECTED NOT DETECTED Final   Klebsiella oxytoca NOT DETECTED NOT DETECTED Final   Klebsiella pneumoniae NOT DETECTED NOT DETECTED Final   Proteus species NOT DETECTED NOT DETECTED Final   Salmonella species NOT DETECTED NOT DETECTED Final   Serratia marcescens NOT DETECTED NOT DETECTED Final   Haemophilus influenzae NOT DETECTED NOT DETECTED Final   Neisseria meningitidis NOT DETECTED NOT DETECTED Final   Pseudomonas aeruginosa NOT DETECTED NOT DETECTED Final   Stenotrophomonas maltophilia NOT DETECTED NOT DETECTED Final   Candida albicans NOT DETECTED NOT DETECTED Final   Candida auris NOT DETECTED NOT DETECTED Final   Candida  glabrata NOT DETECTED NOT DETECTED Final   Candida krusei NOT DETECTED NOT DETECTED Final   Candida parapsilosis NOT DETECTED NOT DETECTED Final   Candida tropicalis NOT DETECTED NOT DETECTED Final   Cryptococcus neoformans/gattii NOT DETECTED NOT DETECTED Final    Comment: Performed at [facility], [address], [place], [place] [address]  Culture, blood (Routine X 2) w Reflex to ID Panel     Status: None (Preliminary result)   Collection Time: 07/13/23  3:57 PM   Specimen: BLOOD  Result Value Ref Range Status   Specimen Description   Final    BLOOD SPECIMEN SOURCE NOT MARKED ON REQUISITION Performed at [facility], [address], [place], [place] [address]    Special Requests   Final    BOTTLES DRAWN AEROBIC AND ANAEROBIC Blood Culture adequate volume Performed at [facility], [address], [place], [place] [address]    Culture   Final    NO GROWTH 3 DAYS Performed at [facility], [address], [place], [place] [address]    Report Status PENDING  Incomplete     Radiology Studies: No results found.   Scheduled Meds:  amLODipine  10 mg Oral q morning   bisacodyl  10 mg Rectal Once   carvedilol  12.5 mg Oral BID WC   chlorpheniramine-HYDROcodone  5 mL Oral Q12H   cyanocobalamin  1,000 mcg Oral Daily   feeding supplement  237 mL Oral BID BM   guaiFENesin  600 mg Oral BID   lidocaine  1 patch Transdermal Q24H   pantoprazole  40 mg Oral Daily   polyethylene glycol  17 g Oral BID   senna-docusate  1 tablet Oral BID   sodium phosphate  1 enema Rectal Once   Continuous Infusions:  azithromycin 500 mg (07/15/23 1535)   cefTRIAXone (ROCEPHIN)  IV 2 g (07/15/23 1542)     LOS: 3 days   Burnadette Pop, MD [facility] P2/23/2025, 10:35 AM
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
## 2023-07-16 NOTE — Plan of Care (Signed)

## 2023-07-17 ENCOUNTER — Other Ambulatory Visit: Payer: Self-pay

## 2023-07-17 DIAGNOSIS — G893 Neoplasm related pain (acute) (chronic): Secondary | ICD-10-CM | POA: Diagnosis not present

## 2023-07-17 DIAGNOSIS — C61 Malignant neoplasm of prostate: Secondary | ICD-10-CM

## 2023-07-17 DIAGNOSIS — Z7189 Other specified counseling: Secondary | ICD-10-CM

## 2023-07-17 DIAGNOSIS — J189 Pneumonia, unspecified organism: Secondary | ICD-10-CM | POA: Diagnosis not present

## 2023-07-17 LAB — CULTURE, BLOOD (ROUTINE X 2): Special Requests: ADEQUATE

## 2023-07-17 LAB — GLUCOSE, CAPILLARY
Glucose-Capillary: 97 mg/dL (ref 70–99)
Glucose-Capillary: 101 mg/dL — ABNORMAL HIGH (ref 70–99)
Glucose-Capillary: 124 mg/dL — ABNORMAL HIGH (ref 70–99)
Glucose-Capillary: 119 mg/dL — ABNORMAL HIGH (ref 70–99)

## 2023-07-17 MED ORDER — TRAZODONE HCL 50 MG PO TABS
50.0000 mg | ORAL_TABLET | Freq: Every day | ORAL | Status: DC
  Administered 2023-07-17 – 2023-07-18 (×2): 50 mg via ORAL
  Filled 2023-07-17 (×2): qty 1

## 2023-07-17 MED ORDER — DEXAMETHASONE SODIUM PHOSPHATE 10 MG/ML IJ SOLN
4.0000 mg | INTRAMUSCULAR | Status: DC
  Administered 2023-07-17 – 2023-07-18 (×2): 4 mg via INTRAVENOUS
  Filled 2023-07-17 (×2): qty 1

## 2023-07-17 MED ORDER — HYDROMORPHONE HCL 1 MG/ML IJ SOLN
0.5000 mg | INTRAMUSCULAR | Status: DC | PRN
  Administered 2023-07-17 – 2023-07-19 (×7): 0.5 mg via INTRAVENOUS
  Filled 2023-07-17 (×8): qty 0.5

## 2023-07-17 MED ORDER — OXYCODONE HCL 5 MG PO TABS
10.0000 mg | ORAL_TABLET | Freq: Four times a day (QID) | ORAL | Status: DC
  Administered 2023-07-17 – 2023-07-19 (×7): 10 mg via ORAL
  Filled 2023-07-17 (×7): qty 2

## 2023-07-17 MED ORDER — LEVOFLOXACIN 750 MG PO TABS
750.0000 mg | ORAL_TABLET | Freq: Every day | ORAL | Status: AC
  Administered 2023-07-17 – 2023-07-19 (×3): 750 mg via ORAL
  Filled 2023-07-17 (×3): qty 1

## 2023-07-17 MED ORDER — ACETAMINOPHEN 325 MG PO TABS
650.0000 mg | ORAL_TABLET | Freq: Four times a day (QID) | ORAL | Status: DC
  Administered 2023-07-18 – 2023-07-19 (×5): 650 mg via ORAL
  Filled 2023-07-17 (×4): qty 2

## 2023-07-17 NOTE — Progress Notes (Signed)
 Physical Therapy Evaluation Patient Details Name: Troy Mclaughlin MRN: [id number] DOB: 23-Aug-1955 Today's Date: 07/17/2023  History of Present Illness  Pt is 68 yo male presented on 07/13/23 with congestion, malaise, SOB. Pt found to have extensive Lsided PNE.  Pt with hx including but not limited to type 2 diabetes, PUD/gastric ulcer, GERD, hypertension, coronary artery  disease, stage IV prostate Cancer, TIA  Clinical Impression  Pt admitted with above diagnosis. Pt reports at baseline he lives in single level home with his wife and is independent.  Pt did reports 6-7 falls that have occurred on stairs entering home.  Today, pt did need increased time to respond and with some decreased safety awareness (unsure of baseline).  He ambulated 47' but with heavy use of RW, labored pattern, and reports of lightheadedness (needing cues to return to room).  Pt was on RA with sats >90%.  He did have drop in BP with initial stand but improved in 3 mins.  With decreased safety, mobility, endurance, and hx of falls - recommend HHPT at d/c.  Pt currently with functional limitations due to the deficits listed below (see PT Problem List). Pt will benefit from acute skilled PT to increase their independence and safety with mobility to allow discharge.       BP Sitting: 135/77  BP Standing: 114/77 BP standing 3 mins: 129/84     If plan is discharge home, recommend the following: Assistance with cooking/housework;Help with stairs or ramp for entrance   Can travel by private vehicle        Equipment Recommendations None recommended by PT  Recommendations for Other Services       Functional Status Assessment Patient has had a recent decline in their functional status and demonstrates the ability to make significant improvements in function in a reasonable and predictable amount of time.     Precautions / Restrictions Precautions Precautions: Fall      Mobility  Bed Mobility Overal bed mobility: Needs  Assistance Bed Mobility: Supine to Sit     Supine to sit: Modified independent (Device/Increase time)     General bed mobility comments: Heavy use of rails    Transfers Overall transfer level: Needs assistance   Transfers: Sit to/from Stand Sit to Stand: Supervision           General transfer comment: Close supervision; performed x 3; performed toielting ADLs independently    Ambulation/Gait Ambulation/Gait assistance: Contact guard assist Gait Distance (Feet): 50 Feet Assistive device: Rolling walker (2 wheels) Gait Pattern/deviations: Step-to pattern, Decreased stride length, Trunk flexed Gait velocity: decreased     General Gait Details: Pt did take a few steps in room without RW but antalgic; used RW for longer distance with heavy dependence on RW.  During walk, asked pt if he was doing ok and he stated "no", asked why and he stated "lightheaded/dizzy,"  cued pt to return to room but he stated wanted to do more.  Provided further education that not safe to walk while lightheaded and returned to room with pt.  Stairs            Wheelchair Mobility     Tilt Bed    Modified Rankin (Stroke Patients Only)       Balance Overall balance assessment: Needs assistance Sitting-balance support: No upper extremity supported Sitting balance-Leahy Scale: Good     Standing balance support: Bilateral upper extremity supported, No upper extremity supported Standing balance-Leahy Scale: Fair Standing balance comment: hx of falls; ambulated  in room without AD but antalgic; RW for hallway                             Pertinent Vitals/Pain      Home Living Family/patient expects to be discharged to:: Private residence Living Arrangements: Spouse/significant other Available Help at Discharge: Family;Available 24 hours/day Type of Home: House Home Access: Stairs to enter Entrance Stairs-Rails:  (reports rails are old) Secretary/administrator of Steps: 4    Home Layout: One level Home Equipment: Agricultural consultant (2 wheels)      Prior Function Prior Level of Function : Independent/Modified Independent             Mobility Comments: Could ambulate in community with rest breaks. Used RW when needed. Reports 6-7 falls on his steps, no other falls ADLs Comments: independent adls and iadls     Extremity/Trunk Assessment   Upper Extremity Assessment Upper Extremity Assessment: RUE deficits/detail;LUE deficits/detail RUE Deficits / Details: ROM WFL ; At least 3/5 -did not further test due to flank pain (CA pain) LUE Deficits / Details: ROM WFL ; At least 3/5 -did not further test due to flank pain (CA pain)    Lower Extremity Assessment Lower Extremity Assessment: LLE deficits/detail;RLE deficits/detail RLE Deficits / Details: ROM WFL; MMT 4/5 LLE Deficits / Details: ROM WFL ; MMT 4/5    Cervical / Trunk Assessment Cervical / Trunk Assessment: Normal  Communication        Cognition Arousal: Alert Behavior During Therapy: WFL for tasks assessed/performed   PT - Cognitive impairments: Problem solving, Safety/Judgement, No family/caregiver present to determine baseline                       PT - Cognition Comments: Pt oriented x 3 but does need increased time, delayed responses , decreased safety awareness (pt seemed unbothered by falls at home, was dizzy/lightheaded during session but wanted to keep walking).  Also only able to name 1 animal starting with C when asked for 3. Following commands: Intact       Cueing       General Comments General comments (skin integrity, edema, etc.): Pt on RA with sats >90%    Exercises     Assessment/Plan    PT Assessment Patient needs continued PT services  PT Problem List Decreased strength;Cardiopulmonary status limiting activity;Decreased range of motion;Decreased activity tolerance;Decreased balance;Decreased mobility;Decreased knowledge of use of DME;Decreased  cognition;Decreased safety awareness       PT Treatment Interventions DME instruction;Therapeutic exercise;Gait training;Balance training;Stair training;Neuromuscular re-education;Functional mobility training;Therapeutic activities;Patient/family education;Cognitive remediation    PT Goals (Current goals can be found in the Care Plan section)  Acute Rehab PT Goals Patient Stated Goal: return home PT Goal Formulation: With patient Time For Goal Achievement: 07/31/23 Potential to Achieve Goals: Good    Frequency Min 1X/week     Co-evaluation               AM-PAC PT "6 Clicks" Mobility  Outcome Measure Help needed turning from your back to your side while in a flat bed without using bedrails?: A Little Help needed moving from lying on your back to sitting on the side of a flat bed without using bedrails?: A Little Help needed moving to and from a bed to a chair (including a wheelchair)?: A Little Help needed standing up from a chair using your arms (e.g., wheelchair or bedside chair)?: A Little Help  needed to walk in hospital room?: A Little Help needed climbing 3-5 steps with a railing? : A Little 6 Click Score: 18    End of Session Equipment Utilized During Treatment: Gait belt Activity Tolerance: Treatment limited secondary to medical complications (Comment) Patient left: with chair alarm set;in chair;with call bell/phone within reach Nurse Communication: Mobility status PT Visit Diagnosis: Other abnormalities of gait and mobility (R26.89)    Time: 0272-5366 PT Time Calculation (min) (ACUTE ONLY): 25 min   Charges:   PT Evaluation $PT Eval Low Complexity: 1 Low PT Treatments $Gait Training: 8-22 mins PT General Charges $$ ACUTE PT VISIT: 1 Visit         Anise Salvo, PT Acute Rehab Services [facility] [phone]   Rayetta Humphrey 07/17/2023, 11:09 AM

## 2023-07-17 NOTE — Plan of Care (Signed)

## 2023-07-17 NOTE — Progress Notes (Signed)
 PROGRESS NOTE  Troy Mclaughlin  MWU:[id number] DOB: 1955/10/22 DOA: 07/13/2023 PCP: Ivonne Andrew, NP   Brief Narrative:  Patient is a 68 year old male with history of  type 2 diabetes, PUD/gastric ulcer, GERD, hypertension, coronary artery  disease, stage IV prostate Cancer, TIA who presented with complaint of fatigue, malaise, productive cough, shortness of breath, congestion from home.  On presentation, he was hemodynamically stable.  COVID/flu/RSV negative.  Lab work showed severe hypokalemia of 2.6, magnesium of 1.6.  Chest x-ray showed patchy density throughout left lung suggesting extensive left-sided pneumonia.  He has been saturating well on room air.  Patient admitted for the management of CAP.  Hospital course remarkable for cough, constipation, cancer related pain.  Awaiting palliative care evaluation today.  Assessment & Plan:  Principal Problem:   CAP (community acquired pneumonia) Active Problems:   Essential hypertension   Hypokalemia   GLAUCOMA   Coronary artery disease   Abdominal wall pain   Type 2 diabetes mellitus without complication, without long-term current use of insulin (HCC)   Gastroesophageal reflux disease with esophagitis   Hyperlipidemia   Protein-calorie malnutrition, severe   Malignant neoplasm of prostate (HCC)   Anemia of chronic disease   Cancer associated pain   Hypomagnesemia   Viral URI   Goals of care, counseling/discussion  Community-acquired pneumonia: Presented with weakness, fatigue, malaise, cough, shortness of breath, congestion.Chest x-ray showed patchy density throughout left lung suggesting extensive left-sided pneumonia.  He has been saturating well on room air.  Initially started on ceftriaxone, azithromycin. Negative streptococcal pneumoniae.  Follow-up blood culture:NGTD.  Currently afebrile, no leukocytosis, on room air.  Continue bronchodilators as needed, incentive spirometer.  Cought better today.  Antibiotic changed to  oral  Metastatic prostate cancer/cancer related pain: Follows with oncology.  Status post EBRT, radiation treatment.  Currently on darolutamide.    Takes oxycodone, Tylenol at home.  Oncology recommended palliative care consultation here.  He is still full code.  Oncology recommending DNR  Hypokalemia/hypomagnesemia: Currently being monitored and supplemented as needed  Hypertension: On amlodipine, Coreg at home.    Increased the dose of Coreg.BP stable now  Coronary artery disease: No anginal symptoms.  Continue current medications  Type 2 diabetes: Currently on sliding scale.  Monitor blood sugars  Hyperlipidemia:On  Lipitor  Vitamin B12 deficiency: On supplementation  Anemia of chronic disease: Currently hemoglobin stable.  Constipation:   Continue MiraLAX, Senokot.  Had a bowel last bowel movement today after given Fleet enema  Goals of care: Multiple comorbidities including metastatic prostate cancer.  Oncology has recommended palliative care consultation.  Still full code.       DVT prophylaxis:SCDs Start: 07/13/23 1645     Code Status: Full Code  Family Communication: None at bedside  Patient status:Inpatient  Patient is from :Home  Anticipated discharge VO:ZDGU  Estimated DC date:tomorrow.  Awaiting palliative care consultation, pain management   Consultants: Oncology  Procedures:None  Antimicrobials:  Anti-infectives (From admission, onward)    Start     Dose/Rate Route Frequency Ordered Stop   07/17/23 1000  levofloxacin (LEVAQUIN) tablet 750 mg        750 mg Oral Daily 07/17/23 0808 07/20/23 0959   07/14/23 1500  cefTRIAXone (ROCEPHIN) 2 g in sodium chloride 0.9 % 100 mL IVPB  Status:  Discontinued        2 g 200 mL/hr over 30 Minutes Intravenous Every 24 hours 07/14/23 0741 07/17/23 0808   07/13/23 1515  azithromycin (ZITHROMAX) 500 mg in sodium  chloride 0.9 % 250 mL IVPB  Status:  Discontinued        500 mg 250 mL/hr over 60 Minutes Intravenous  Every 24 hours 07/13/23 1514 07/17/23 0808   07/13/23 1515  cefTRIAXone (ROCEPHIN) 1 g in sodium chloride 0.9 % 100 mL IVPB  Status:  Discontinued        1 g 200 mL/hr over 30 Minutes Intravenous Every 24 hours 07/13/23 1514 07/14/23 0741       Subjective: Patient seen and examined at bedside today.  He says he does not feel good but overall he looks comfortable.  On room air.  Not coughing, speaking in full sentences during my evaluation.  Had a large bowel after we gave Fleet enema today.  Abdomen was soft and nondistended.  Complains of pain everywhere.  Objective: Vitals:   07/16/23 0617 07/16/23 1217 07/16/23 1907 07/17/23 0437  BP: 132/83 126/75 121/72 123/71  Pulse: 74 75 72 69  Resp: 15 20 18 18   Temp: 98.2 F (36.8 C) 98.2 F (36.8 C) (!) 97.4 F (36.3 C) 97.8 F (36.6 C)  TempSrc: Oral Oral    SpO2: 95% 97% 96% 95%  Weight:      Height:        Intake/Output Summary (Last 24 hours) at 07/17/2023 1122 Last data filed at 07/17/2023 0900 Gross per 24 hour  Intake 480 ml  Output 151 ml  Net 329 ml   Filed Weights   07/13/23 1829  Weight: 59.3 kg    Examination:   General exam: Overall comfortable, not in distress, chronically deconditioned HEENT: PERRL Respiratory system:  no wheezes or crackles , diminished sounds/crackles on the left base Cardiovascular system: S1 & S2 heard, RRR.  Gastrointestinal system: Abdomen is nondistended, soft and nontender. Central nervous system: Alert and oriented Extremities: No edema, no clubbing ,no cyanosis Skin: No rashes, no ulcers,no icterus     Data Reviewed: I have personally reviewed following labs and imaging studies  CBC: Recent Labs  Lab 07/13/23 1458 07/13/23 1556 07/14/23 0535 07/15/23 0754  WBC 7.4  --  5.2 6.1  HGB 10.6* 9.2* 10.0* 10.5*  HCT 32.9* 27.0* 29.8* 31.3*  MCV 78.5*  --  77.0* 76.7*  PLT 421*  --  362 381   Basic Metabolic Panel: Recent Labs  Lab 07/13/23 1546 07/13/23 1556  07/14/23 0535 07/15/23 0754  NA 137 140 133* 133*  K 2.6* 2.7* 3.8 4.5  CL 107 106 101 99  CO2 21*  --  22 24  GLUCOSE 102* 97 101* 92  BUN 11 9 11 10   CREATININE 0.71 0.70 0.66 0.67  CALCIUM 7.1*  --  8.3* 8.7*  MG 1.6*  --   --   --   PHOS 2.5  --   --   --      Recent Results (from the past 240 hours)  Resp panel by RT-PCR (RSV, Flu A&B, Covid) Anterior Nasal Swab     Status: None   Collection Time: 07/13/23  2:58 PM   Specimen: Anterior Nasal Swab  Result Value Ref Range Status   SARS Coronavirus 2 by RT PCR NEGATIVE NEGATIVE Final    Comment: (NOTE) SARS-CoV-2 target nucleic acids are NOT DETECTED.  The SARS-CoV-2 RNA is generally detectable in upper respiratory specimens during the acute phase of infection. The lowest concentration of SARS-CoV-2 viral copies this assay can detect is 138 copies/mL. A negative result does not preclude SARS-Cov-2 infection and should not be used as  the sole basis for treatment or other patient management decisions. A negative result may occur with  improper specimen collection/handling, submission of specimen other than nasopharyngeal swab, presence of viral mutation(s) within the areas targeted by this assay, and inadequate number of viral copies(<138 copies/mL). A negative result must be combined with clinical observations, patient history, and epidemiological information. The expected result is Negative.  Fact Sheet for Patients:  BloggerCourse.com  Fact Sheet for Healthcare Providers:  SeriousBroker.it  This test is no t yet approved or cleared by the [place] FDA and  has been authorized for detection and/or diagnosis of SARS-CoV-2 by FDA under an Emergency Use Authorization (EUA). This EUA will remain  in effect (meaning this test can be used) for the duration of the COVID-19 declaration under Section 564(b)(1) of the Act, 21 U.S.C.section 360bbb-3(b)(1), unless the  authorization is terminated  or revoked sooner.       Influenza A by PCR NEGATIVE NEGATIVE Final   Influenza B by PCR NEGATIVE NEGATIVE Final    Comment: (NOTE) The Xpert Xpress SARS-CoV-2/FLU/RSV plus assay is intended as an aid in the diagnosis of influenza from Nasopharyngeal swab specimens and should not be used as a sole basis for treatment. Nasal washings and aspirates are unacceptable for Xpert Xpress SARS-CoV-2/FLU/RSV testing.  Fact Sheet for Patients: BloggerCourse.com  Fact Sheet for Healthcare Providers: SeriousBroker.it  This test is not yet approved or cleared by the [place] FDA and has been authorized for detection and/or diagnosis of SARS-CoV-2 by FDA under an Emergency Use Authorization (EUA). This EUA will remain in effect (meaning this test can be used) for the duration of the COVID-19 declaration under Section 564(b)(1) of the Act, 21 U.S.C. section 360bbb-3(b)(1), unless the authorization is terminated or revoked.     Resp Syncytial Virus by PCR NEGATIVE NEGATIVE Final    Comment: (NOTE) Fact Sheet for Patients: BloggerCourse.com  Fact Sheet for Healthcare Providers: SeriousBroker.it  This test is not yet approved or cleared by the [place] FDA and has been authorized for detection and/or diagnosis of SARS-CoV-2 by FDA under an Emergency Use Authorization (EUA). This EUA will remain in effect (meaning this test can be used) for the duration of the COVID-19 declaration under Section 564(b)(1) of the Act, 21 U.S.C. section 360bbb-3(b)(1), unless the authorization is terminated or revoked.  Performed at [facility], [address] [place], [place] [address]   Culture, blood (Routine X 2) w Reflex to ID Panel     Status: Abnormal   Collection Time: 07/13/23  3:34 PM   Specimen: BLOOD  Result Value Ref Range Status    Specimen Description   Final    BLOOD SPECIMEN SOURCE NOT MARKED ON REQUISITION Performed at [facility], [address] [place], [place] [address]    Special Requests   Final    BOTTLES DRAWN AEROBIC AND ANAEROBIC Blood Culture adequate volume Performed at [facility], [address] [place], [place] [address]    Culture  Setup Time   Final    GRAM POSITIVE COCCI ANAEROBIC BOTTLE ONLY CRITICAL RESULT CALLED TO, READ BACK BY AND VERIFIED WITH: PHARMD J GADHIA [id number] AT 1256 BY EC    Culture (A)  Final    MICROCOCCUS SPECIES Standardized susceptibility testing for this organism is not available. Performed at [facility], [address] [place], [place] [address]    Report Status 07/17/2023 FINAL  Final  Blood Culture ID Panel (Reflexed)  Status: None   Collection Time: 07/13/23  3:34 PM  Result Value Ref Range Status   Enterococcus faecalis NOT DETECTED NOT DETECTED Final   Enterococcus Faecium NOT DETECTED NOT DETECTED Final   Listeria monocytogenes NOT DETECTED NOT DETECTED Final   Staphylococcus species NOT DETECTED NOT DETECTED Final   Staphylococcus aureus (BCID) NOT DETECTED NOT DETECTED Final   Staphylococcus epidermidis NOT DETECTED NOT DETECTED Final   Staphylococcus lugdunensis NOT DETECTED NOT DETECTED Final   Streptococcus species NOT DETECTED NOT DETECTED Final   Streptococcus agalactiae NOT DETECTED NOT DETECTED Final   Streptococcus pneumoniae NOT DETECTED NOT DETECTED Final   Streptococcus pyogenes NOT DETECTED NOT DETECTED Final   A.calcoaceticus-baumannii NOT DETECTED NOT DETECTED Final   Bacteroides fragilis NOT DETECTED NOT DETECTED Final   Enterobacterales NOT DETECTED NOT DETECTED Final   Enterobacter cloacae complex NOT DETECTED NOT DETECTED Final   Escherichia coli NOT DETECTED NOT DETECTED Final   Klebsiella aerogenes NOT DETECTED NOT DETECTED Final   Klebsiella oxytoca NOT DETECTED NOT DETECTED  Final   Klebsiella pneumoniae NOT DETECTED NOT DETECTED Final   Proteus species NOT DETECTED NOT DETECTED Final   Salmonella species NOT DETECTED NOT DETECTED Final   Serratia marcescens NOT DETECTED NOT DETECTED Final   Haemophilus influenzae NOT DETECTED NOT DETECTED Final   Neisseria meningitidis NOT DETECTED NOT DETECTED Final   Pseudomonas aeruginosa NOT DETECTED NOT DETECTED Final   Stenotrophomonas maltophilia NOT DETECTED NOT DETECTED Final   Candida albicans NOT DETECTED NOT DETECTED Final   Candida auris NOT DETECTED NOT DETECTED Final   Candida glabrata NOT DETECTED NOT DETECTED Final   Candida krusei NOT DETECTED NOT DETECTED Final   Candida parapsilosis NOT DETECTED NOT DETECTED Final   Candida tropicalis NOT DETECTED NOT DETECTED Final   Cryptococcus neoformans/gattii NOT DETECTED NOT DETECTED Final    Comment: Performed at [facility], [address], [place], [place] [address]  Culture, blood (Routine X 2) w Reflex to ID Panel     Status: None (Preliminary result)   Collection Time: 07/13/23  3:57 PM   Specimen: BLOOD  Result Value Ref Range Status   Specimen Description   Final    BLOOD SPECIMEN SOURCE NOT MARKED ON REQUISITION Performed at [facility], [address], [place], [place] [address]    Special Requests   Final    BOTTLES DRAWN AEROBIC AND ANAEROBIC Blood Culture adequate volume Performed at [facility], [address] [place], [place] [address]    Culture   Final    NO GROWTH 4 DAYS Performed at [facility], [address] [place], [place] [address]    Report Status PENDING  Incomplete     Radiology Studies: CT CHEST ABDOMEN PELVIS W CONTRAST Result Date: 07/16/2023 CLINICAL DATA:  Prostate cancer. Worsening pain. Assess treatment response. * Tracking Code: BO * EXAM: CT CHEST, ABDOMEN, AND PELVIS WITH CONTRAST TECHNIQUE: Multidetector CT imaging of the chest, abdomen and pelvis was performed  following the standard protocol during bolus administration of intravenous contrast. RADIATION DOSE REDUCTION: This exam was performed according to the departmental dose-optimization program which includes automated exposure control, adjustment of the mA and/or kV according to patient size and/or use of iterative reconstruction technique. CONTRAST:  OMNIPAQUE IOHEXOL 300 MG/ML  SOLN COMPARISON:  PSMA PET scan 03/01/2023 FINDINGS: CT CHEST FINDINGS Cardiovascular: No significant vascular findings. Normal heart size. No pericardial effusion. Mediastinum/Nodes: No axillary or supraclavicular adenopathy. No mediastinal or  hilar adenopathy. No pericardial fluid. Esophagus normal. Lungs/Pleura: There is new confluent airspace disease within the entire LEFT lower lobe and a portion of the lingula most suggestive of dense lobar pneumonia. There is volume loss in the LEFT hemithorax. RIGHT lung is clear. There is underlying benign cysts throughout the LEFT and RIGHT lung not changed from prior. Musculoskeletal: Widespread sclerotic skeletal metastasis unchanged from comparison exam. CT ABDOMEN AND PELVIS FINDINGS Hepatobiliary: Again demonstrated hypodense lesion in the RIGHT hepatic lobe measuring 2.8 cm (59/2). This was not PSMA avid on recent PET scan. Lesion present on contrast CT 05/31/2022. No new hepatic lesions. Pancreas: Pancreas is normal. No ductal dilatation. No pancreatic inflammation. Spleen: Normal spleen Adrenals/urinary tract: Adrenal glands normal. Adrenal glands normal. Kidneys are normal. There is surgical clips in the LEFT suprarenal and posterior renal space. No hydronephrosis. Bladder normal. Stomach/Bowel: Postsurgical change in the gastric antral region. Gastro jejunostomy at this site. No evidence of bowel obstruction. The colon and rectosigmoid colon are normal. Vascular/Lymphatic: Abdominal aorta normal caliber. Calcifications are present. No pelvic lymphadenopathy. No periaortic  retroperitoneal adenopathy. Reproductive: Fiducial markers the prostate gland. Other: No free fluid. Musculoskeletal: Multiple sclerotic lesions throughout the pelvis and spine. Several these lesions appear increased in size. For example sclerotic lesion in the mid LEFT iliac bone adjacent the SI joint measures 17 mm compared to 16 mm. Lesion in the RIGHT iliac bone along the SI joint measures 13 mm compared to 8 mm (image 97/2) lesion in the RIGHT iliac wing measuring 8 mm compares to 3 mm. Multiple leads lesions radiotracer avid on comparison BS may PET scan. IMPRESSION: 1. Dominant finding is confluent airspace disease within the LEFT lower lobe and lingula. Findings are most suggestive of lobar pneumonia. Confluent prostate cancer metastasis is less favored. 2. Widespread sclerotic skeletal metastasis. The size of sclerotic lesions are increased in the pelvis. These lesions are radiotracer avid on comparison FDG PET scan. 3. No evidence of lymphadenopathy. 4. No evidence of liver metastasis. 5. Electronically Signed   By: Genevive Bi M.D.   On: 07/16/2023 16:26     Scheduled Meds:  amLODipine  10 mg Oral q morning   bisacodyl  10 mg Rectal Once   carvedilol  12.5 mg Oral BID WC   chlorpheniramine-HYDROcodone  5 mL Oral Q12H   cyanocobalamin  1,000 mcg Oral Daily   feeding supplement  237 mL Oral BID BM   guaiFENesin  600 mg Oral BID   levofloxacin  750 mg Oral Daily   lidocaine  1 patch Transdermal Q24H   pantoprazole  40 mg Oral Daily   polyethylene glycol  17 g Oral BID   senna-docusate  1 tablet Oral BID   sodium phosphate  1 enema Rectal Once   Continuous Infusions:     LOS: 4 days   Burnadette Pop, MD [facility] P2/24/2025, 11:22 AM

## 2023-07-17 NOTE — Progress Notes (Signed)
 Troy Mclaughlin   DOB:10-27-1955   GN#:[id number]    ASSESSMENT & PLAN:  68 year old male with history of type 2 diabetes, gastric ulcer, GERD, CAD, hypertension, TIA and stage IV mCRPC presented with coughing, shortness of breath and congestion following pneumonia.  Currently receiving antibiotics.  Report pain still uncontrolled about 5 out of 10.   He recently just started prostate cancer directed treatment after prolonged discussion again last month.  Previously he elected not to start darolutamide but did not tell me until his office visit.  We discussed his malignancy is stage IV and not curable several times.  Disease control may be obtained if he continues his oral darolutamide consistently.  We will also resume ADT as outpatient.  His performance status is fairly borderline, and is not a candidate for chemotherapy at this time.  We also talked about goals of care.  Yesterday I explained about CODE STATUS.  We talked about this again today.  We go over the difference again between full code, vs DNR and what CPR may look like.  Given his bone metastases, frail condition, I do not recommend CPR or full code.  He is not ready for DNR but would like to talk to palliative care and consider this.  I think this is very reasonable and he should have this documented as well.  Community-acquired pneumonia RLL. Continue antibiotics per primary team.  Appreciate management.   mCRPC with bone metastases PSA 104.6 from 2/22. Repeat in about a month in clinic Patient just started darolutamide consistently in the past month. Follow up in about 4 weeks in clinic   Uncontrolled hypertension Recommend continue antihypertensive consistently upon discharge Continue close follow up with PCP   Cancer related pain Not well controlled.  Recommend palliative care consult and follow-up consistently as outpatient Increase fluid, bowel regimen, stool softener, MiraLAX as needed.   Constipation bowel regimen, stool  softener, MiraLAX as needed. Enema was ordered by primary team  B12 deficiency Continue B12 1000 mcg daily  Goals of care Discussed today  Discharge planning Pending palliative care consult.  Will set up outpatient Lupron injection and follow-up.  All questions were answered.   Thank you for the consult. Will follow with you. Melven Sartorius, MD 07/17/2023 8:36 AM  Subjective:  Deanglo reports feeling about the same.  Some cough, shortness of breath.  No chest pain.  Reported pain well-controlled.  Chart report he has been using as needed IV Dilaudid.  Pain is more on the lower left side but also on the right hip area.  Also complains of abdominal pain comes and go.  He has no bowel movement.  Reported no difficulty with urination.  Objective:  Vitals:   07/16/23 1907 07/17/23 0437  BP: 121/72 123/71  Pulse: 72 69  Resp: 18 18  Temp: (!) 97.4 F (36.3 C) 97.8 F (36.6 C)  SpO2: 96% 95%     Intake/Output Summary (Last 24 hours) at 07/17/2023 0836 Last data filed at 07/16/2023 2100 Gross per 24 hour  Intake 480 ml  Output 150 ml  Net 330 ml    GENERAL: alert, no distress and comfortable SKIN: skin color normal EYES: normal, sclera clear OROPHARYNX: moist, no exudate, no erythema.  LUNGS: normal breathing effort.  ABDOMEN: abdomen soft, non-tender and non-distended Musculoskeletal: no lower extremity edema NEURO: alert with fluent speech, no focal motor/sensory deficits   Labs:  Recent Labs    06/19/23 0734 07/13/23 1546 07/13/23 1556 07/14/23 0535 07/15/23 0754  NA 133* 137 140 133* 133*  K 4.0 2.6* 2.7* 3.8 4.5  CL 100 107 106 101 99  CO2 27 21*  --  22 24  GLUCOSE 110* 102* 97 101* 92  BUN 12 11 9 11 10   CREATININE 0.87 0.71 0.70 0.66 0.67  CALCIUM 8.9 7.1*  --  8.3* 8.7*  GFRNONAA >60 >60  --  >60 >60  PROT 7.1 6.2*  --  6.2*  --   ALBUMIN 3.5 2.3*  --  2.3*  --   AST 16 11*  --  11*  --   ALT 6 7  --  7  --   ALKPHOS 356* 236*  --  234*  --    BILITOT 0.2 0.3  --  0.3  --     Studies:  CT CHEST ABDOMEN PELVIS W CONTRAST Result Date: 07/16/2023 CLINICAL DATA:  Prostate cancer. Worsening pain. Assess treatment response. * Tracking Code: BO * EXAM: CT CHEST, ABDOMEN, AND PELVIS WITH CONTRAST TECHNIQUE: Multidetector CT imaging of the chest, abdomen and pelvis was performed following the standard protocol during bolus administration of intravenous contrast. RADIATION DOSE REDUCTION: This exam was performed according to the departmental dose-optimization program which includes automated exposure control, adjustment of the mA and/or kV according to patient size and/or use of iterative reconstruction technique. CONTRAST:  OMNIPAQUE IOHEXOL 300 MG/ML  SOLN COMPARISON:  PSMA PET scan 03/01/2023 FINDINGS: CT CHEST FINDINGS Cardiovascular: No significant vascular findings. Normal heart size. No pericardial effusion. Mediastinum/Nodes: No axillary or supraclavicular adenopathy. No mediastinal or hilar adenopathy. No pericardial fluid. Esophagus normal. Lungs/Pleura: There is new confluent airspace disease within the entire LEFT lower lobe and a portion of the lingula most suggestive of dense lobar pneumonia. There is volume loss in the LEFT hemithorax. RIGHT lung is clear. There is underlying benign cysts throughout the LEFT and RIGHT lung not changed from prior. Musculoskeletal: Widespread sclerotic skeletal metastasis unchanged from comparison exam. CT ABDOMEN AND PELVIS FINDINGS Hepatobiliary: Again demonstrated hypodense lesion in the RIGHT hepatic lobe measuring 2.8 cm (59/2). This was not PSMA avid on recent PET scan. Lesion present on contrast CT 05/31/2022. No new hepatic lesions. Pancreas: Pancreas is normal. No ductal dilatation. No pancreatic inflammation. Spleen: Normal spleen Adrenals/urinary tract: Adrenal glands normal. Adrenal glands normal. Kidneys are normal. There is surgical clips in the LEFT suprarenal and posterior renal space. No  hydronephrosis. Bladder normal. Stomach/Bowel: Postsurgical change in the gastric antral region. Gastro jejunostomy at this site. No evidence of bowel obstruction. The colon and rectosigmoid colon are normal. Vascular/Lymphatic: Abdominal aorta normal caliber. Calcifications are present. No pelvic lymphadenopathy. No periaortic retroperitoneal adenopathy. Reproductive: Fiducial markers the prostate gland. Other: No free fluid. Musculoskeletal: Multiple sclerotic lesions throughout the pelvis and spine. Several these lesions appear increased in size. For example sclerotic lesion in the mid LEFT iliac bone adjacent the SI joint measures 17 mm compared to 16 mm. Lesion in the RIGHT iliac bone along the SI joint measures 13 mm compared to 8 mm (image 97/2) lesion in the RIGHT iliac wing measuring 8 mm compares to 3 mm. Multiple leads lesions radiotracer avid on comparison BS may PET scan. IMPRESSION: 1. Dominant finding is confluent airspace disease within the LEFT lower lobe and lingula. Findings are most suggestive of lobar pneumonia. Confluent prostate cancer metastasis is less favored. 2. Widespread sclerotic skeletal metastasis. The size of sclerotic lesions are increased in the pelvis. These lesions are radiotracer avid on comparison FDG PET scan. 3. No  evidence of lymphadenopathy. 4. No evidence of liver metastasis. 5. Electronically Signed   By: Genevive Bi M.D.   On: 07/16/2023 16:26   DG Chest Port 1 View Result Date: 07/13/2023 CLINICAL DATA:  Flu-like symptoms with cough and congestion. Undergoing treatment for colon cancer. EXAM: PORTABLE CHEST 1 VIEW COMPARISON:  01/12/2023 FINDINGS: Patchy density throughout the majority of the left lung. Clear right lung. Normal-sized heart. No acute bony abnormality. IMPRESSION: Extensive left lung pneumonia Electronically Signed   By: Beckie Salts M.D.   On: 07/13/2023 15:08

## 2023-07-17 NOTE — Progress Notes (Signed)
 Mobility Specialist - Progress Note   07/17/23 1406  Mobility  Activity Ambulated with assistance in hallway  Level of Assistance Contact guard assist, steadying assist  Assistive Device Front wheel walker  Distance Ambulated (ft) 66 ft  Range of Motion/Exercises Active  Activity Response Tolerated well  Mobility Referral Yes  Mobility visit 1 Mobility  Mobility Specialist Start Time (ACUTE ONLY) 1350  Mobility Specialist Stop Time (ACUTE ONLY) 1406  Mobility Specialist Time Calculation (min) (ACUTE ONLY) 16 min   Pt was found on recliner chair and agreeable to ambulate. Grew fatigued with session. At EOS returned to bed with all needs met. Call bell in reach and bed alarm on.  Billey Chang Mobility Specialist

## 2023-07-18 ENCOUNTER — Inpatient Hospital Stay: Payer: 59

## 2023-07-18 ENCOUNTER — Telehealth: Payer: Self-pay

## 2023-07-18 DIAGNOSIS — J189 Pneumonia, unspecified organism: Secondary | ICD-10-CM | POA: Diagnosis not present

## 2023-07-18 LAB — HEPATIC FUNCTION PANEL
ALT: 9 U/L (ref 0–44)
AST: 12 U/L — ABNORMAL LOW (ref 15–41)
Albumin: 2.6 g/dL — ABNORMAL LOW (ref 3.5–5.0)
Alkaline Phosphatase: 245 U/L — ABNORMAL HIGH (ref 38–126)
Bilirubin, Direct: <0.1 mg/dL (ref 0.0–0.2)
Total Bilirubin: 0.4 mg/dL (ref 0.0–1.2)
Total Protein: 7.3 g/dL (ref 6.5–8.1)

## 2023-07-18 LAB — BASIC METABOLIC PANEL
Anion gap: 13 (ref 5–15)
BUN: 16 mg/dL (ref 8–23)
CO2: 20 mmol/L — ABNORMAL LOW (ref 22–32)
Calcium: 9.4 mg/dL (ref 8.9–10.3)
Chloride: 100 mmol/L (ref 98–111)
Creatinine, Ser: 0.66 mg/dL (ref 0.61–1.24)
GFR, Estimated: >60 mL/min (ref >60)
Glucose, Bld: 129 mg/dL — ABNORMAL HIGH (ref 70–99)
Potassium: 5.3 mmol/L — ABNORMAL HIGH (ref 3.5–5.1)
Sodium: 133 mmol/L — ABNORMAL LOW (ref 135–145)

## 2023-07-18 LAB — CBC
HCT: 37.5 % — ABNORMAL LOW (ref 39.0–52.0)
Hemoglobin: 12.2 g/dL — ABNORMAL LOW (ref 13.0–17.0)
MCH: 25.6 pg — ABNORMAL LOW (ref 26.0–34.0)
MCHC: 32.5 g/dL (ref 30.0–36.0)
MCV: 78.8 fL — ABNORMAL LOW (ref 80.0–100.0)
Platelets: 449 10*3/uL — ABNORMAL HIGH (ref 150–400)
RBC: 4.76 MIL/uL (ref 4.22–5.81)
RDW: 14.5 % (ref 11.5–15.5)
WBC: 5.7 10*3/uL (ref 4.0–10.5)
nRBC: 0 % (ref 0.0–0.2)

## 2023-07-18 LAB — CULTURE, BLOOD (ROUTINE X 2)
Culture: NO GROWTH
Special Requests: ADEQUATE

## 2023-07-18 LAB — MAGNESIUM: Magnesium: 2 mg/dL (ref 1.7–2.4)

## 2023-07-18 LAB — GLUCOSE, CAPILLARY: Glucose-Capillary: 180 mg/dL — ABNORMAL HIGH (ref 70–99)

## 2023-07-18 MED ORDER — ALBUTEROL SULFATE HFA 108 (90 BASE) MCG/ACT IN AERS
1.0000 | INHALATION_SPRAY | Freq: Four times a day (QID) | RESPIRATORY_TRACT | Status: DC
  Administered 2023-07-18: 1 via RESPIRATORY_TRACT
  Administered 2023-07-18: 2 via RESPIRATORY_TRACT
  Filled 2023-07-18: qty 6.7

## 2023-07-18 MED ORDER — TERBINAFINE HCL 1 % EX CREA
TOPICAL_CREAM | Freq: Two times a day (BID) | CUTANEOUS | Status: DC
  Filled 2023-07-18: qty 12

## 2023-07-18 MED ORDER — ORAL CARE MOUTH RINSE: 15.0000 mL | OROMUCOSAL | Status: DC | PRN

## 2023-07-18 MED ORDER — SODIUM ZIRCONIUM CYCLOSILICATE 10 G PO PACK
10.0000 g | PACK | Freq: Once | ORAL | Status: AC
  Administered 2023-07-18: 10 g via ORAL
  Filled 2023-07-18: qty 1

## 2023-07-18 MED ORDER — METHYLNALTREXONE BROMIDE 12 MG/0.6ML ~~LOC~~ SOLN
12.0000 mg | Freq: Once | SUBCUTANEOUS | Status: AC
  Administered 2023-07-18: 12 mg via SUBCUTANEOUS
  Filled 2023-07-18: qty 0.6

## 2023-07-18 NOTE — Plan of Care (Signed)

## 2023-07-18 NOTE — Progress Notes (Signed)
 Mobility Specialist - Progress Note   07/18/23 1355  Mobility  Activity Ambulated with assistance in hallway  Level of Assistance Standby assist, set-up cues, supervision of patient - no hands on  Assistive Device Front wheel walker  Distance Ambulated (ft) 120 ft  Range of Motion/Exercises Active  Activity Response Tolerated well  Mobility Referral Yes  Mobility visit 1 Mobility  Mobility Specialist Start Time (ACUTE ONLY) 1445  Mobility Specialist Stop Time (ACUTE ONLY) 1456  Mobility Specialist Time Calculation (min) (ACUTE ONLY) 11 min   Received in bed and after some convincing agreed to mobility. Had no issues throughout session, returned to bed with all needs met.  Marilynne Halsted Mobility Specialist

## 2023-07-18 NOTE — Progress Notes (Addendum)
 PROGRESS NOTE  KALDEN WANKE  UJW:[id number] DOB: 02-Nov-1955 DOA: 07/13/2023 PCP: Ivonne Andrew, NP   Brief Narrative:  Patient is a 68 year old male with history of  type 2 diabetes, PUD/gastric ulcer, GERD, hypertension, coronary artery  disease, stage IV prostate Cancer, TIA, cancer related pain who presented with complaint of fatigue, malaise, productive cough, shortness of breath, congestion from home.  On presentation, he was hemodynamically stable.  COVID/flu/RSV negative.  Lab work showed severe hypokalemia of 2.6, magnesium of 1.6.  Chest x-ray showed patchy density throughout left lung suggesting extensive left-sided pneumonia.   Patient admitted for the management of CAP.  Hospital course remarkable for cough, constipation, cancer related pain.  Awaiting palliative care evaluation today for optimization of pain medications as per oncology request  Assessment & Plan:  Principal Problem:   CAP (community acquired pneumonia) Active Problems:   Essential hypertension   Hypokalemia   GLAUCOMA   Coronary artery disease   Abdominal wall pain   Type 2 diabetes mellitus without complication, without long-term current use of insulin (HCC)   Gastroesophageal reflux disease with esophagitis   Hyperlipidemia   Protein-calorie malnutrition, severe   Malignant neoplasm of prostate (HCC)   Anemia of chronic disease   Cancer associated pain   Hypomagnesemia   Viral URI   Goals of care, counseling/discussion  Community-acquired pneumonia: Presented with weakness, fatigue, malaise, cough, shortness of breath, congestion.Chest x-ray showed patchy density throughout left lung suggesting extensive left-sided pneumonia.  He has been saturating well on room air.  Initially started on ceftriaxone, azithromycin. Negative streptococcal pneumoniae.  Follow-up blood culture:NGTD.  Currently afebrile, no leukocytosis, on room air.  Continue bronchodilators as needed, incentive spirometer.  Cought  better . Antibiotic changed to oral, will finish tomorrow  Metastatic prostate cancer/cancer related severe pain: Follows with oncology.  Status post EBRT, radiation treatment.  Currently on darolutamide.    Takes oxycodone, Tylenol at home.  Oncology recommended palliative care consultation here for optimization of pain medications.  He is pain is uncontrolled.[name]  He is still full code.  Oncology recommending DNR.  Needs to discuss  goals of care.  Hypertension: On amlodipine, Coreg at home.    Increased the dose of Coreg.BP stable now  Coronary artery disease: No anginal symptoms.  Continue current medications  Type 2 diabetes: Currently on sliding scale.  Monitor blood sugars  Hyperlipidemia:On  Lipitor  Vitamin B12 deficiency: On supplementation  Hyperkalemia: Potassium 5.3 today.  Given Lokelma  Anemia of chronic disease: Currently hemoglobin stable.  Constipation:   Continue MiraLAX, Senokot.  Had a bowel last bowel movement on 2/24   Goals of care: Multiple comorbidities including metastatic prostate cancer.  Oncology has recommended palliative care consultation.  Still full code.       DVT prophylaxis:SCDs Start: 07/13/23 1645     Code Status: Full Code  Family Communication: None at bedside  Patient status:Inpatient  Patient is from :Home  Anticipated discharge FA:OZHY  Estimated DC date:tomorrow.  Awaiting palliative care consultation, pain management   Consultants: Oncology, palliative care  Procedures:None  Antimicrobials:  Anti-infectives (From admission, onward)    Start     Dose/Rate Route Frequency Ordered Stop   07/17/23 1000  levofloxacin (LEVAQUIN) tablet 750 mg        750 mg Oral Daily 07/17/23 0808 07/20/23 0959   07/14/23 1500  cefTRIAXone (ROCEPHIN) 2 g in sodium chloride 0.9 % 100 mL IVPB  Status:  Discontinued        2  g 200 mL/hr over 30 Minutes Intravenous Every 24 hours 07/14/23 0741 07/17/23 0808   07/13/23 1515  azithromycin  (ZITHROMAX) 500 mg in sodium chloride 0.9 % 250 mL IVPB  Status:  Discontinued        500 mg 250 mL/hr over 60 Minutes Intravenous Every 24 hours 07/13/23 1514 07/17/23 0808   07/13/23 1515  cefTRIAXone (ROCEPHIN) 1 g in sodium chloride 0.9 % 100 mL IVPB  Status:  Discontinued        1 g 200 mL/hr over 30 Minutes Intravenous Every 24 hours 07/13/23 1514 07/14/23 0741       Subjective: Patient seen and examined at bedside today.  Hemodynamically stable.  He looks slightly better than yesterday.  Still complains of pain on the left sided lower ribs.  On room air.  No coughing today.  Had a bowel movement yesterday.  Awaiting palliative care evaluation  Objective: Vitals:   07/17/23 0437 07/17/23 1428 07/17/23 1907 07/18/23 0400  BP: 123/71 110/69 135/82 (!) 157/93  Pulse: 69 70 70 68  Resp: 18 14 18 15   Temp: 97.8 F (36.6 C) 98.4 F (36.9 C) 97.8 F (36.6 C) (!) 97.3 F (36.3 C)  TempSrc:   Oral Oral  SpO2: 95% 95% 96% 97%  Weight:      Height:        Intake/Output Summary (Last 24 hours) at 07/18/2023 1208 Last data filed at 07/18/2023 0820 Gross per 24 hour  Intake 120 ml  Output 150 ml  Net -30 ml   Filed Weights   07/13/23 1829  Weight: 59.3 kg    Examination:  General exam: Overall comfortable, not in distress, chronically ill looking, deconditioned HEENT: PERRL Respiratory system:  no wheezes or crackles , diminished sounds on the left base Cardiovascular system: S1 & S2 heard, RRR.  Gastrointestinal system: Abdomen is nondistended, soft and nontender. Central nervous system: Alert and oriented Extremities: No edema, no clubbing ,no cyanosis Skin: No rashes, no ulcers,no icterus     Data Reviewed: I have personally reviewed following labs and imaging studies  CBC: Recent Labs  Lab 07/13/23 1458 07/13/23 1556 07/14/23 0535 07/15/23 0754 07/18/23 0530  WBC 7.4  --  5.2 6.1 5.7  HGB 10.6* 9.2* 10.0* 10.5* 12.2*  HCT 32.9* 27.0* 29.8* 31.3* 37.5*  MCV  78.5*  --  77.0* 76.7* 78.8*  PLT 421*  --  362 381 449*   Basic Metabolic Panel: Recent Labs  Lab 07/13/23 1546 07/13/23 1556 07/14/23 0535 07/15/23 0754 07/18/23 0530  NA 137 140 133* 133* 133*  K 2.6* 2.7* 3.8 4.5 5.3*  CL 107 106 101 99 100  CO2 21*  --  22 24 20*  GLUCOSE 102* 97 101* 92 129*  BUN 11 9 11 10 16   CREATININE 0.71 0.70 0.66 0.67 0.66  CALCIUM 7.1*  --  8.3* 8.7* 9.4  MG 1.6*  --   --   --   --   PHOS 2.5  --   --   --   --      Recent Results (from the past 240 hours)  Resp panel by RT-PCR (RSV, Flu A&B, Covid) Anterior Nasal Swab     Status: None   Collection Time: 07/13/23  2:58 PM   Specimen: Anterior Nasal Swab  Result Value Ref Range Status   SARS Coronavirus 2 by RT PCR NEGATIVE NEGATIVE Final    Comment: (NOTE) SARS-CoV-2 target nucleic acids are NOT DETECTED.  The SARS-CoV-2 RNA  is generally detectable in upper respiratory specimens during the acute phase of infection. The lowest concentration of SARS-CoV-2 viral copies this assay can detect is 138 copies/mL. A negative result does not preclude SARS-Cov-2 infection and should not be used as the sole basis for treatment or other patient management decisions. A negative result may occur with  improper specimen collection/handling, submission of specimen other than nasopharyngeal swab, presence of viral mutation(s) within the areas targeted by this assay, and inadequate number of viral copies(<138 copies/mL). A negative result must be combined with clinical observations, patient history, and epidemiological information. The expected result is Negative.  Fact Sheet for Patients:  BloggerCourse.com  Fact Sheet for Healthcare Providers:  SeriousBroker.it  This test is no t yet approved or cleared by the [place] FDA and  has been authorized for detection and/or diagnosis of SARS-CoV-2 by FDA under an Emergency Use Authorization (EUA). This  EUA will remain  in effect (meaning this test can be used) for the duration of the COVID-19 declaration under Section 564(b)(1) of the Act, 21 U.S.C.section 360bbb-3(b)(1), unless the authorization is terminated  or revoked sooner.       Influenza A by PCR NEGATIVE NEGATIVE Final   Influenza B by PCR NEGATIVE NEGATIVE Final    Comment: (NOTE) The Xpert Xpress SARS-CoV-2/FLU/RSV plus assay is intended as an aid in the diagnosis of influenza from Nasopharyngeal swab specimens and should not be used as a sole basis for treatment. Nasal washings and aspirates are unacceptable for Xpert Xpress SARS-CoV-2/FLU/RSV testing.  Fact Sheet for Patients: BloggerCourse.com  Fact Sheet for Healthcare Providers: SeriousBroker.it  This test is not yet approved or cleared by the [place] FDA and has been authorized for detection and/or diagnosis of SARS-CoV-2 by FDA under an Emergency Use Authorization (EUA). This EUA will remain in effect (meaning this test can be used) for the duration of the COVID-19 declaration under Section 564(b)(1) of the Act, 21 U.S.C. section 360bbb-3(b)(1), unless the authorization is terminated or revoked.     Resp Syncytial Virus by PCR NEGATIVE NEGATIVE Final    Comment: (NOTE) Fact Sheet for Patients: BloggerCourse.com  Fact Sheet for Healthcare Providers: SeriousBroker.it  This test is not yet approved or cleared by the [place] FDA and has been authorized for detection and/or diagnosis of SARS-CoV-2 by FDA under an Emergency Use Authorization (EUA). This EUA will remain in effect (meaning this test can be used) for the duration of the COVID-19 declaration under Section 564(b)(1) of the Act, 21 U.S.C. section 360bbb-3(b)(1), unless the authorization is terminated or revoked.  Performed at [facility], [address] [place], [place] [address]   Culture, blood (Routine X 2) w Reflex to ID Panel     Status: Abnormal   Collection Time: 07/13/23  3:34 PM   Specimen: BLOOD  Result Value Ref Range Status   Specimen Description   Final    BLOOD SPECIMEN SOURCE NOT MARKED ON REQUISITION Performed at [facility], [address] [place], [place] [address]    Special Requests   Final    BOTTLES DRAWN AEROBIC AND ANAEROBIC Blood Culture adequate volume Performed at [facility], [address], [place], [place] [address]    Culture  Setup Time   Final    GRAM POSITIVE COCCI ANAEROBIC BOTTLE ONLY CRITICAL RESULT CALLED TO, READ BACK BY AND VERIFIED WITH: PHARMD J GADHIA [phone] AT 1256 BY EC    Culture (A)  Final  MICROCOCCUS SPECIES Standardized susceptibility testing for this organism is not available. Performed at [facility], [address] [place], [place] [address]    Report Status 07/17/2023 FINAL  Final  Blood Culture ID Panel (Reflexed)     Status: None   Collection Time: 07/13/23  3:34 PM  Result Value Ref Range Status   Enterococcus faecalis NOT DETECTED NOT DETECTED Final   Enterococcus Faecium NOT DETECTED NOT DETECTED Final   Listeria monocytogenes NOT DETECTED NOT DETECTED Final   Staphylococcus species NOT DETECTED NOT DETECTED Final   Staphylococcus aureus (BCID) NOT DETECTED NOT DETECTED Final   Staphylococcus epidermidis NOT DETECTED NOT DETECTED Final   Staphylococcus lugdunensis NOT DETECTED NOT DETECTED Final   Streptococcus species NOT DETECTED NOT DETECTED Final   Streptococcus agalactiae NOT DETECTED NOT DETECTED Final   Streptococcus pneumoniae NOT DETECTED NOT DETECTED Final   Streptococcus pyogenes NOT DETECTED NOT DETECTED Final   A.calcoaceticus-baumannii NOT DETECTED NOT DETECTED Final   Bacteroides fragilis NOT DETECTED NOT DETECTED Final   Enterobacterales NOT DETECTED NOT DETECTED Final   Enterobacter cloacae  complex NOT DETECTED NOT DETECTED Final   Escherichia coli NOT DETECTED NOT DETECTED Final   Klebsiella aerogenes NOT DETECTED NOT DETECTED Final   Klebsiella oxytoca NOT DETECTED NOT DETECTED Final   Klebsiella pneumoniae NOT DETECTED NOT DETECTED Final   Proteus species NOT DETECTED NOT DETECTED Final   Salmonella species NOT DETECTED NOT DETECTED Final   Serratia marcescens NOT DETECTED NOT DETECTED Final   Haemophilus influenzae NOT DETECTED NOT DETECTED Final   Neisseria meningitidis NOT DETECTED NOT DETECTED Final   Pseudomonas aeruginosa NOT DETECTED NOT DETECTED Final   Stenotrophomonas maltophilia NOT DETECTED NOT DETECTED Final   Candida albicans NOT DETECTED NOT DETECTED Final   Candida auris NOT DETECTED NOT DETECTED Final   Candida glabrata NOT DETECTED NOT DETECTED Final   Candida krusei NOT DETECTED NOT DETECTED Final   Candida parapsilosis NOT DETECTED NOT DETECTED Final   Candida tropicalis NOT DETECTED NOT DETECTED Final   Cryptococcus neoformans/gattii NOT DETECTED NOT DETECTED Final    Comment: Performed at [facility], [address] [place], [place] [address]  Culture, blood (Routine X 2) w Reflex to ID Panel     Status: None   Collection Time: 07/13/23  3:57 PM   Specimen: BLOOD  Result Value Ref Range Status   Specimen Description   Final    BLOOD SPECIMEN SOURCE NOT MARKED ON REQUISITION Performed at [facility], [address], [place], [place] [address]    Special Requests   Final    BOTTLES DRAWN AEROBIC AND ANAEROBIC Blood Culture adequate volume Performed at [facility], [address] [place], [place] [address]    Culture   Final    NO GROWTH 5 DAYS Performed at [facility], [address] [place], [place] [address]    Report Status 07/18/2023 FINAL  Final     Radiology Studies: CT CHEST ABDOMEN PELVIS W CONTRAST Result Date: 07/16/2023 CLINICAL DATA:  Prostate cancer. Worsening pain.  Assess treatment response. * Tracking Code: BO * EXAM: CT CHEST, ABDOMEN, AND PELVIS WITH CONTRAST TECHNIQUE: Multidetector CT imaging of the chest, abdomen and pelvis was performed following the standard protocol during bolus administration of intravenous contrast. RADIATION DOSE REDUCTION: This exam was performed according to the departmental dose-optimization program which includes automated exposure control, adjustment of the mA and/or kV according to patient size and/or use of  iterative reconstruction technique. CONTRAST:  OMNIPAQUE IOHEXOL 300 MG/ML  SOLN COMPARISON:  PSMA PET scan 03/01/2023 FINDINGS: CT CHEST FINDINGS Cardiovascular: No significant vascular findings. Normal heart size. No pericardial effusion. Mediastinum/Nodes: No axillary or supraclavicular adenopathy. No mediastinal or hilar adenopathy. No pericardial fluid. Esophagus normal. Lungs/Pleura: There is new confluent airspace disease within the entire LEFT lower lobe and a portion of the lingula most suggestive of dense lobar pneumonia. There is volume loss in the LEFT hemithorax. RIGHT lung is clear. There is underlying benign cysts throughout the LEFT and RIGHT lung not changed from prior. Musculoskeletal: Widespread sclerotic skeletal metastasis unchanged from comparison exam. CT ABDOMEN AND PELVIS FINDINGS Hepatobiliary: Again demonstrated hypodense lesion in the RIGHT hepatic lobe measuring 2.8 cm (59/2). This was not PSMA avid on recent PET scan. Lesion present on contrast CT 05/31/2022. No new hepatic lesions. Pancreas: Pancreas is normal. No ductal dilatation. No pancreatic inflammation. Spleen: Normal spleen Adrenals/urinary tract: Adrenal glands normal. Adrenal glands normal. Kidneys are normal. There is surgical clips in the LEFT suprarenal and posterior renal space. No hydronephrosis. Bladder normal. Stomach/Bowel: Postsurgical change in the gastric antral region. Gastro jejunostomy at this site. No evidence of bowel  obstruction. The colon and rectosigmoid colon are normal. Vascular/Lymphatic: Abdominal aorta normal caliber. Calcifications are present. No pelvic lymphadenopathy. No periaortic retroperitoneal adenopathy. Reproductive: Fiducial markers the prostate gland. Other: No free fluid. Musculoskeletal: Multiple sclerotic lesions throughout the pelvis and spine. Several these lesions appear increased in size. For example sclerotic lesion in the mid LEFT iliac bone adjacent the SI joint measures 17 mm compared to 16 mm. Lesion in the RIGHT iliac bone along the SI joint measures 13 mm compared to 8 mm (image 97/2) lesion in the RIGHT iliac wing measuring 8 mm compares to 3 mm. Multiple leads lesions radiotracer avid on comparison BS may PET scan. IMPRESSION: 1. Dominant finding is confluent airspace disease within the LEFT lower lobe and lingula. Findings are most suggestive of lobar pneumonia. Confluent prostate cancer metastasis is less favored. 2. Widespread sclerotic skeletal metastasis. The size of sclerotic lesions are increased in the pelvis. These lesions are radiotracer avid on comparison FDG PET scan. 3. No evidence of lymphadenopathy. 4. No evidence of liver metastasis. 5. Electronically Signed   By: Genevive Bi M.D.   On: 07/16/2023 16:26     Scheduled Meds:  acetaminophen  650 mg Oral QID   amLODipine  10 mg Oral q morning   bisacodyl  10 mg Rectal Once   carvedilol  12.5 mg Oral BID WC   chlorpheniramine-HYDROcodone  5 mL Oral Q12H   cyanocobalamin  1,000 mcg Oral Daily   dexamethasone (DECADRON) injection  4 mg Intravenous Q24H   feeding supplement  237 mL Oral BID BM   guaiFENesin  600 mg Oral BID   levofloxacin  750 mg Oral Daily   lidocaine  1 patch Transdermal Q24H   oxyCODONE  10 mg Oral QID   pantoprazole  40 mg Oral Daily   polyethylene glycol  17 g Oral BID   senna-docusate  1 tablet Oral BID   sodium phosphate  1 enema Rectal Once   traZODone  50 mg Oral QHS   Continuous  Infusions:     LOS: 5 days   Burnadette Pop, MD [facility] P2/25/2025, 12:08 PM

## 2023-07-18 NOTE — Telephone Encounter (Signed)
 Informed patient's spouse of cancelled appointments. Advised that she have Jaedan call me back as she was unable to schedule appointments.

## 2023-07-19 ENCOUNTER — Other Ambulatory Visit (HOSPITAL_COMMUNITY): Payer: Self-pay

## 2023-07-19 ENCOUNTER — Other Ambulatory Visit: Payer: Self-pay

## 2023-07-19 DIAGNOSIS — J189 Pneumonia, unspecified organism: Secondary | ICD-10-CM | POA: Diagnosis not present

## 2023-07-19 LAB — BASIC METABOLIC PANEL
Anion gap: 9 (ref 5–15)
BUN: 21 mg/dL (ref 8–23)
CO2: 25 mmol/L (ref 22–32)
Calcium: 9.2 mg/dL (ref 8.9–10.3)
Chloride: 99 mmol/L (ref 98–111)
Creatinine, Ser: 0.77 mg/dL (ref 0.61–1.24)
GFR, Estimated: >60 mL/min (ref >60)
Glucose, Bld: 121 mg/dL — ABNORMAL HIGH (ref 70–99)
Potassium: 4.8 mmol/L (ref 3.5–5.1)
Sodium: 133 mmol/L — ABNORMAL LOW (ref 135–145)

## 2023-07-19 LAB — VITAMIN D 25 HYDROXY (VIT D DEFICIENCY, FRACTURES): Vit D, 25-Hydroxy: 13 ng/mL — ABNORMAL LOW (ref 30–100)

## 2023-07-19 MED ORDER — CARVEDILOL 12.5 MG PO TABS
12.5000 mg | ORAL_TABLET | Freq: Two times a day (BID) | ORAL | 0 refills | Status: DC
  Filled 2023-07-19 – 2023-08-04 (×2): qty 60, 30d supply, fill #0

## 2023-07-19 MED ORDER — OXYCODONE HCL ER 10 MG PO T12A
10.0000 mg | EXTENDED_RELEASE_TABLET | Freq: Two times a day (BID) | ORAL | 0 refills | Status: DC
  Filled 2023-07-19: qty 14, 7d supply, fill #0

## 2023-07-19 MED ORDER — POLYETHYLENE GLYCOL 3350 17 GM/SCOOP PO POWD
17.0000 g | Freq: Every day | ORAL | 0 refills | Status: DC
  Filled 2023-07-19: qty 238, 14d supply, fill #0

## 2023-07-19 MED ORDER — DEXAMETHASONE 2 MG PO TABS
2.0000 mg | ORAL_TABLET | Freq: Every day | ORAL | 0 refills | Status: AC
  Filled 2023-07-19: qty 7, 7d supply, fill #0

## 2023-07-19 MED ORDER — NALOXEGOL OXALATE 12.5 MG PO TABS
12.5000 mg | ORAL_TABLET | Freq: Every day | ORAL | 0 refills | Status: DC
  Filled 2023-07-19 – 2023-08-04 (×2): qty 30, 30d supply, fill #0

## 2023-07-19 MED ORDER — DEXAMETHASONE 4 MG PO TABS
2.0000 mg | ORAL_TABLET | Freq: Every day | ORAL | Status: DC
  Administered 2023-07-19: 2 mg via ORAL
  Filled 2023-07-19: qty 1

## 2023-07-19 MED ORDER — SENNOSIDES-DOCUSATE SODIUM 8.6-50 MG PO TABS
1.0000 | ORAL_TABLET | Freq: Two times a day (BID) | ORAL | 0 refills | Status: DC
  Filled 2023-07-19: qty 28, 14d supply, fill #0

## 2023-07-19 MED ORDER — TERBINAFINE HCL 1 % EX CREA
TOPICAL_CREAM | Freq: Two times a day (BID) | CUTANEOUS | 0 refills | Status: DC
  Filled 2023-07-19: qty 60, fill #0

## 2023-07-19 MED ORDER — ALBUTEROL SULFATE HFA 108 (90 BASE) MCG/ACT IN AERS
1.0000 | INHALATION_SPRAY | Freq: Two times a day (BID) | RESPIRATORY_TRACT | Status: DC
Start: 2023-07-19 — End: 2023-07-19
  Filled 2023-07-19: qty 6.7

## 2023-07-19 MED ORDER — HYDROMORPHONE HCL 2 MG PO TABS: 2.0000 mg | ORAL_TABLET | ORAL | Status: DC | PRN

## 2023-07-19 MED ORDER — AMLODIPINE BESYLATE 10 MG PO TABS
10.0000 mg | ORAL_TABLET | Freq: Every morning | ORAL | 0 refills | Status: DC
  Filled 2023-07-19: qty 30, 30d supply, fill #0

## 2023-07-19 MED ORDER — XTAMPZA ER 9 MG PO C12A
9.0000 mg | EXTENDED_RELEASE_CAPSULE | Freq: Two times a day (BID) | ORAL | 0 refills | Status: AC
  Filled 2023-07-19: qty 14, 7d supply, fill #0

## 2023-07-19 MED ORDER — TRAZODONE HCL 50 MG PO TABS
50.0000 mg | ORAL_TABLET | Freq: Every day | ORAL | 0 refills | Status: DC
  Filled 2023-07-19: qty 30, 30d supply, fill #0

## 2023-07-19 MED ORDER — ALBUTEROL SULFATE (2.5 MG/3ML) 0.083% IN NEBU
2.5000 mg | INHALATION_SOLUTION | Freq: Two times a day (BID) | RESPIRATORY_TRACT | Status: DC
  Administered 2023-07-19: 2.5 mg via RESPIRATORY_TRACT
  Filled 2023-07-19: qty 3

## 2023-07-19 MED ORDER — HYDROMORPHONE HCL 2 MG PO TABS
2.0000 mg | ORAL_TABLET | ORAL | 0 refills | Status: DC | PRN
  Filled 2023-07-19: qty 20, 4d supply, fill #0

## 2023-07-19 MED ORDER — SILDENAFIL CITRATE 100 MG PO TABS
ORAL_TABLET | ORAL | 4 refills | Status: DC
  Filled 2023-07-19: qty 5, 5d supply, fill #0

## 2023-07-19 MED ORDER — OXYCODONE HCL ER 10 MG PO T12A
10.0000 mg | EXTENDED_RELEASE_TABLET | Freq: Two times a day (BID) | ORAL | Status: DC
  Administered 2023-07-19: 10 mg via ORAL
  Filled 2023-07-19: qty 1

## 2023-07-19 NOTE — Progress Notes (Signed)
 [facility] Liaison Note:   Notified by [facility] [place] of request for [facility] OP palliative services at home after discharge.   Hospital liaison will follow for discharge disposition.    Please call with any hospice our outpatient palliative care related questions.   Thank you for the opportunity to participate in this patient's care.   Henderson Newcomer, LPN [facility] Liaison [phone]

## 2023-07-19 NOTE — TOC Transition Note (Signed)
 Transition of Care [facility]) - Discharge Note   Patient Details  Name: Troy Mclaughlin MRN: [id number] Date of Birth: 11-15-1955  Transition [facility] CM/SW Contact:  Otelia Santee, LCSW Phone Number: 07/19/2023, 11:19 AM   Clinical Narrative:    Pt reports he was supposed to have home health services already but, states no one has been coming out. Pt denies knowing name of agency or having any contact. Unable to locate agency following pt. HHPT has been arranged with [facility]. Pt also agreeable to outpatient palliative care follow up and is agreeable to referral being made to [facility]. Referral for palliative care has been made to [facility]. Pt reports he will get a cab to take him home at discharge.   List of HHA w/ Medicare star rating provided to pt:   [facility] Quality rating ???? Patient survey rating ????   [facility] Quality rating ????? Patient survey rating???   [facility] [phone] Quality rating???? Patient survey rating????   [facility] [phone] Quality rating???? Patient survey rating????   [facility] [phone] Quality rating ???? Patient survey rating???   [facility] Quality rating??? Patient survey rating???   [facility] [phone] Quality rating??? Patient survey rating ????   [facility] [phone] Quality rating ????   [facility] Quality rating ??? Patient survey rating ????   [facility] Quality rating ??? Patient survey rating???   [facility] Quality rating ????? Patient survey rating ???   [facility] [phone] Quality rating ????? Patient survey rating ????  Final next level of care: Home w Home Health Services Barriers to Discharge: No Barriers Identified   Patient Goals and CMS  Choice Patient states their goals for this hospitalization and ongoing recovery are:: To return home CMS Medicare.gov Compare Post Acute Care list provided to:: Patient Choice offered to / list presented to : Patient [place] ownership interest in [facility].provided to::  (NA)    Discharge Placement                       Discharge Plan and Services Additional resources added to the After Visit Summary for                  DME Arranged: N/A DME Agency: NA       HH Arranged: PT HH Agency: [facility] Date [facility] Agency Contacted: 07/18/23 Time HH Agency Contacted: 1618 Representative spoke with at [facility] Agency: Marylene Land  Social Drivers of Health (SDOH) Interventions SDOH Screenings   Food Insecurity: Food Insecurity Present (07/13/2023)  Housing: Low Risk  (07/13/2023)  Transportation Needs: Unmet Transportation Needs (07/13/2023)  Utilities: Not At Risk (07/13/2023)  Alcohol Screen: Low Risk  (12/25/2020)  Depression (PHQ2-9): Medium Risk (06/07/2023)  Financial Resource Strain: High Risk (05/05/2020)  Physical Activity: Insufficiently Active (12/25/2020)  Social Connections: Moderately Isolated (07/13/2023)  Stress: No Stress Concern Present (12/25/2020)  Tobacco Use: High Risk (07/13/2023)     Readmission Risk Interventions    07/19/2023   11:17 AM 07/18/2023    4:10 PM 07/14/2023   11:57 AM  Readmission Risk Prevention Plan  Transportation Screening Complete Complete Complete  PCP or Specialist Appt within 5-7 Days Complete    PCP or Specialist Appt within 3-5 Days  Complete  Home Care Screening Complete    Medication Review (RN CM) Complete    HRI or Home Care Consult   Complete  Social Work Consult for Recovery Care Planning/Counseling   Complete  Palliative Care Screening   Not Applicable  Medication Review Oceanographer)   Complete

## 2023-07-19 NOTE — Plan of Care (Signed)

## 2023-07-19 NOTE — Consult Note (Signed)
 Palliative Care Consultation  68 yo with metastatic prostate cancer admitted with CAP and electrolyte abnormalities as well as uncontrolled cancer related pain.  I met with [name] at bedside. His pain is under better control after earlier adjustments were made with his regimen. Most of his pain is in his left flank and lower back-worse with movement. No red flag symptoms or weakness in his lower extremities. He struggles with constipation.Oncology has seen him and patient has started disease targeted treatment with darolutimide, He has several other complaints and needs today regarding his QOL including issues with erectile dysfunction and severe fungal infection on his feet and toenails.   He lives with his spouse and is independent of his ADLs. He has multiple siblings. He handles his own affairs and manages his medications. He receives PT at home and finds this to be helpful.   Oncology initiated a goals of care discussion and code status during this admission. I addressed this with him as well today- he knows he has progressive disease but wants to continue treatment that may be helpful and focus on pain and symptom management. I discussed importance of establishing ACP and recommended DNR in the event of cardiac arrest.  Goals of Care Treat reversible illness Focus on Pain and Symptom Management Continue treatment of cancer and attempt to control metastatic disease. He understands importance of identifying a HCPOA and ACP. 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 Consider interventional modalities for bone mets-ie Osteocool etc.. and possibly even additional radiation to bone mets. Opioid induced constipation Will do best with daily Movantik  on discharge along with stool softener and prn senna if needed. Will give him Relistor X1 in hospital. He doesn't want to go through manual disimpaction and or use enemas. Magnesium Supplementation Erectile Dysfunction Patient requested information on medications. I recommended follow up with urology given complex prostate related disease and medications. Offered trial of medication. 5. Severe Tinea Pedis and Onychomycosis  Will treat with Topical Terbinafine, oral regimen could be considered but monitored closely with LFTs and for interactions with other medications.  Plan is to transition to oral home regimen for pain meds and see him in follow up in our cancer center palliative clinic.  He would benefit from in home palliative care support and care services. Needs help with medication management and safety.  Anderson Malta, DO Palliative Medicine   Time: 70 minutes

## 2023-07-19 NOTE — Progress Notes (Signed)
 Physical Therapy Treatment Patient Details Name: Troy Mclaughlin MRN: [id number] DOB: 1955/09/20 Today's Date: 07/19/2023   History of Present Illness Pt is 67 yo male presented on 07/13/23 with congestion, malaise, SOB. Pt found to have extensive Lsided PNE.  Pt with hx including but not limited to type 2 diabetes, PUD/gastric ulcer, GERD, hypertension, coronary artery  disease, stage IV prostate Cancer, TIA    PT Comments  Pt received in bed, agreed to PT session. States he might be going home today if he can receive his medications prior to leaving hospital since he does not currently have a vehicle for transportation. Pt will also need a safe ride home (door to door, since he does not have his Rollator with him for balance.) Pt currently able to demonstrate ModI for bed mobility and transfers. Gait training in hall with RW, slightly tremulous with steps and heavy reliance on support of device. Overall, good improvement functionally. Continue PT acutely per POC.   If plan is discharge home, recommend the following: Assistance with cooking/housework;Help with stairs or ramp for entrance   Can travel by private vehicle        Equipment Recommendations  None recommended by PT;Other (comment) (Pt has a Rollator and w/c at home)    Recommendations for Other Services       Precautions / Restrictions Precautions Precautions: Fall;Other (comment) (Bone Mets) Restrictions Weight Bearing Restrictions Per Provider Order: No     Mobility  Bed Mobility Overal bed mobility: Needs Assistance Bed Mobility: Supine to Sit, Sit to Supine     Supine to sit: Modified independent (Device/Increase time) Sit to supine: Modified independent (Device/Increase time)        Transfers Overall transfer level: Needs assistance Equipment used: Rolling walker (2 wheels) Transfers: Sit to/from Stand Sit to Stand: Modified independent (Device/Increase time), Supervision                 Ambulation/Gait Ambulation/Gait assistance: Contact guard assist Gait Distance (Feet): 120 Feet Assistive device: Rolling walker (2 wheels) Gait Pattern/deviations: Step-through pattern, Decreased stride length, Drifts right/left Gait velocity: decreased     General Gait Details: Slightly tremorous with steps, no LOB noted   Stairs             Wheelchair Mobility     Tilt Bed    Modified Rankin (Stroke Patients Only)       Balance Overall balance assessment: Needs assistance Sitting-balance support: No upper extremity supported Sitting balance-Leahy Scale: Good     Standing balance support: Bilateral upper extremity supported, During functional activity, Reliant on assistive device for balance Standing balance-Leahy Scale: Fair Standing balance comment: hx of falls; ambulated in room without AD but antalgic; RW for hallway                            Communication Communication Communication: No apparent difficulties  Cognition Arousal: Alert Behavior During Therapy: WFL for tasks assessed/performed   PT - Cognitive impairments: No apparent impairments                       PT - Cognition Comments: Pleasant and cooperative, able to answer questions appropriately Following commands: Intact      Cueing Cueing Techniques: Verbal cues  Exercises      General Comments General comments (skin integrity, edema, etc.): Discussed role of PT and pt's set up at home for safe transition at d/c.  Pertinent Vitals/Pain Pain Assessment Pain Assessment: 0-10 Pain Score: 7  Pain Location: L rib area, pelvis Pain Descriptors / Indicators: Aching, Discomfort Pain Intervention(s): Premedicated before session    Home Living                          Prior Function            PT Goals (current goals can now be found in the care plan section) Acute Rehab PT Goals Patient Stated Goal: return home Progress towards PT goals:  Progressing toward goals    Frequency    Min 1X/week      PT Plan      Co-evaluation              AM-PAC PT "6 Clicks" Mobility   Outcome Measure  Help needed turning from your back to your side while in a flat bed without using bedrails?: A Little Help needed moving from lying on your back to sitting on the side of a flat bed without using bedrails?: A Little Help needed moving to and from a bed to a chair (including a wheelchair)?: A Little Help needed standing up from a chair using your arms (e.g., wheelchair or bedside chair)?: A Little Help needed to walk in hospital room?: A Little Help needed climbing 3-5 steps with a railing? : A Little 6 Click Score: 18    End of Session Equipment Utilized During Treatment: Gait belt Activity Tolerance: Patient tolerated treatment well Patient left: in bed;with call bell/phone within reach Nurse Communication: Mobility status PT Visit Diagnosis: Other abnormalities of gait and mobility (R26.89)     Time: 1036-1100 PT Time Calculation (min) (ACUTE ONLY): 24 min  Charges:    $Gait Training: 8-22 mins $Therapeutic Activity: 8-22 mins PT General Charges $$ ACUTE PT VISIT: 1 Visit                    Zadie Cleverly, PTA  [name] 07/19/2023, 11:13 AM

## 2023-07-19 NOTE — Plan of Care (Signed)

## 2023-07-19 NOTE — Discharge Summary (Addendum)
 Physician Discharge Summary  BLAYN WHETSELL ZOX:[id number] DOB: 07/31/1955 DOA: 07/13/2023  PCP: Ivonne Andrew, NP  Admit date: 07/13/2023 Discharge date: 07/19/2023  Admitted From: Home Disposition:  Home  Discharge Condition:Stable CODE STATUS:FULL Diet recommendation: Heart Healthy  Brief/Interim Summary: Patient is a 68 year old male with history of  type 2 diabetes, PUD/gastric ulcer, GERD, hypertension, coronary artery  disease, stage IV prostate Cancer, TIA, cancer related pain who presented with complaint of fatigue, malaise, productive cough, shortness of breath, congestion from home.  On presentation, he was hemodynamically stable.  COVID/flu/RSV negative.  Lab work showed severe hypokalemia of 2.6, magnesium of 1.6.  Chest x-ray showed patchy density throughout left lung suggesting extensive left-sided pneumonia.   Patient admitted for the management of CAP.  Hospital course remarkable for cough, constipation, cancer related pain.  Palliative care was consulted.  Pain medication might have been optimized.  He is stable for discharge home today.  He will follow-up with oncology, palliative care as an outpatient  Following problems were addressed during the hospitalization:  Community-acquired pneumonia: Presented with weakness, fatigue, malaise, cough, shortness of breath, congestion.Chest x-ray showed patchy density throughout left lung suggesting extensive left-sided pneumonia.  He has been saturating well on room air.  Initially started on ceftriaxone, azithromycin. Negative streptococcal pneumoniae.  Follow-up blood culture:NGTD.  Currently afebrile, no leukocytosis, on room air. Cought better . Antibiotic changed to oral, he finished the course   Metastatic prostate cancer/cancer related severe pain: Follows with oncology.  Status post EBRT, radiation treatment.  Currently on darolutamide.    Takes oxycodone, Tylenol at home.  Oncology recommended palliative care consultation here  for optimization of pain medications.    He is still full code.  Oncology recommending DNR.  Needs to discuss  goals of care.  He will follow-up with palliative care as an outpatient.  He will be discharged on long-acting oxycodone, as needed Dilaudid and dexamethasone   Hypertension: On amlodipine, Coreg    Coronary artery disease: No anginal symptoms.  Continue current medications   Hyperlipidemia:On  Lipitor   Vitamin B12 deficiency: On supplementation   Hyperkalemia: Resolved with Lokelma   Anemia of chronic disease: Currently hemoglobin stable.   Constipation:   Continue MiraLAX, Senokot.    Goals of care: Multiple comorbidities including metastatic prostate cancer.  Oncology has recommended palliative care consultation.  Still full code.  Discharge Diagnoses:  Principal Problem:   CAP (community acquired pneumonia) Active Problems:   Essential hypertension   Hypokalemia   GLAUCOMA   Coronary artery disease   Abdominal wall pain   Type 2 diabetes mellitus without complication, without long-term current use of insulin (HCC)   Gastroesophageal reflux disease with esophagitis   Hyperlipidemia   Protein-calorie malnutrition, severe   Malignant neoplasm of prostate (HCC)   Anemia of chronic disease   Cancer associated pain   Hypomagnesemia   Viral URI   Goals of care, counseling/discussion    Discharge Instructions  Discharge Instructions     Diet - low sodium heart healthy   Complete by: As directed    Discharge instructions   Complete by: As directed    1)Please take prescribed medications as instructed 2)Follow up with your oncologist and palliative care as an outpatient   Increase activity slowly   Complete by: As directed       Allergies as of 07/19/2023       Reactions   Lisinopril Anaphylaxis, Swelling   angioedema   Metformin And Related Other (See Comments)  unknown        Medication List     STOP taking these medications    benzonatate  200 MG capsule Commonly known as: TESSALON   hydrALAZINE 10 MG tablet Commonly known as: APRESOLINE   loperamide 2 MG tablet Commonly known as: Imodium A-D   meclizine 25 MG tablet Commonly known as: ANTIVERT   metoCLOPramide 10 MG tablet Commonly known as: REGLAN   ondansetron 4 MG disintegrating tablet Commonly known as: ZOFRAN-ODT   oxyCODONE 5 MG immediate release tablet Commonly known as: Oxy IR/ROXICODONE   pantoprazole 40 MG tablet Commonly known as: PROTONIX   potassium chloride SA 20 MEQ tablet Commonly known as: KLOR-CON M   prochlorperazine 10 MG tablet Commonly known as: COMPAZINE       TAKE these medications    acetaminophen 500 MG tablet Commonly known as: TYLENOL Take 2 tablets (1,000 mg total) by mouth every 6 (six) hours as needed for moderate pain or mild pain.   amLODipine 10 MG tablet Commonly known as: NORVASC Take 1 tablet (10 mg total) by mouth every morning. What changed: Another medication with the same name was removed. Continue taking this medication, and follow the directions you see here.   atorvastatin 10 MG tablet Commonly known as: LIPITOR Take 1 tablet (10 mg total) by mouth every morning.   B-12 1000 MCG Tabs Take 1 tablet (1,000 mcg total) by mouth daily.   carvedilol 12.5 MG tablet Commonly known as: COREG Take 1 tablet (12.5 mg total) by mouth 2 (two) times daily with a meal. What changed:  medication strength how much to take   cetirizine 10 MG tablet Commonly known as: ZYRTEC Take 1 tablet (10 mg total) by mouth daily.   dexamethasone 2 MG tablet Commonly known as: DECADRON Take 1 tablet (2 mg total) by mouth daily for 7 days. Start taking on: July 20, 2023   HYDROmorphone 2 MG tablet Commonly known as: DILAUDID Take 1 tablet (2 mg total) by mouth every 4 (four) hours as needed for moderate pain (pain score 4-6) (Breakthrough pain).   naloxegol oxalate 12.5 MG Tabs tablet Commonly known as:  MOVANTIK Take 1 tablet (12.5 mg total) by mouth daily.   nicotine 14 mg/24hr patch Commonly known as: NICODERM CQ - dosed in mg/24 hours Place 1 patch (14 mg total) onto the skin daily.   nitroGLYCERIN 0.4 MG SL tablet Commonly known as: NITROSTAT DISSOLVE 1 TABLET UNDER THE TONGUE EVERY 5 MINUTES AS NEEDED FOR CHEST PAIN. DO NOT EXCEED A TOTAL OF 3 DOSES IN 15 MINUTES.   Nubeqa 300 MG tablet Generic drug: darolutamide Take 2 tablets (600 mg total) by mouth 2 (two) times daily with a meal.   ondansetron 4 MG tablet Commonly known as: ZOFRAN Take 1 tablet (4 mg total) by mouth every 8 (eight) hours as needed for nausea   polyethylene glycol powder 17 GM/SCOOP powder Commonly known as: MiraLax Take 17 grams dissolved in liquid by mouth daily.   senna-docusate 8.6-50 MG tablet Commonly known as: Senokot-S Take 1 tablet by mouth 2 (two) times daily.   sildenafil 100 MG tablet Commonly known as: VIAGRA Take 1 tablet by mouth 30 minutes before sexual activity as needed for erectile dysfunction   terbinafine 1 % cream Commonly known as: LAMISIL Apply topically 2 (two) times daily.   traZODone 50 MG tablet Commonly known as: DESYREL Take 1 tablet (50 mg total) by mouth at bedtime. What changed:  how much to take when  to take this reasons to take this   Xtampza ER 9 MG C12a Generic drug: oxyCODONE ER Take 9 mg by mouth 2 (two) times daily for 7 days.        Follow-up Information     [facility] [place], [place] Follow up.   Why: [facility] will follow up with you at discharge to provide home health physical therapy Contact information: [address] [place] [address] [phone]         Ivonne Andrew, NP. Schedule an appointment as soon as possible for a visit in 1 week(s).   Specialties: Pulmonary Disease, Endocrinology Contact information: [address] [place] [address] [phone]                 Allergies  Allergen Reactions   Lisinopril Anaphylaxis and Swelling    angioedema   Metformin And Related Other (See Comments)    unknown    Consultations: Oncology, palliative care   Procedures/Studies: CT CHEST ABDOMEN PELVIS W CONTRAST Result Date: 07/16/2023 CLINICAL DATA:  Prostate cancer. Worsening pain. Assess treatment response. * Tracking Code: BO * EXAM: CT CHEST, ABDOMEN, AND PELVIS WITH CONTRAST TECHNIQUE: Multidetector CT imaging of the chest, abdomen and pelvis was performed following the standard protocol during bolus administration of intravenous contrast. RADIATION DOSE REDUCTION: This exam was performed according to the departmental dose-optimization program which includes automated exposure control, adjustment of the mA and/or kV according to patient size and/or use of iterative reconstruction technique. CONTRAST:  OMNIPAQUE IOHEXOL 300 MG/ML  SOLN COMPARISON:  PSMA PET scan 03/01/2023 FINDINGS: CT CHEST FINDINGS Cardiovascular: No significant vascular findings. Normal heart size. No pericardial effusion. Mediastinum/Nodes: No axillary or supraclavicular adenopathy. No mediastinal or hilar adenopathy. No pericardial fluid. Esophagus normal. Lungs/Pleura: There is new confluent airspace disease within the entire LEFT lower lobe and a portion of the lingula most suggestive of dense lobar pneumonia. There is volume loss in the LEFT hemithorax. RIGHT lung is clear. There is underlying benign cysts throughout the LEFT and RIGHT lung not changed from prior. Musculoskeletal: Widespread sclerotic skeletal metastasis unchanged from comparison exam. CT ABDOMEN AND PELVIS FINDINGS Hepatobiliary: Again demonstrated hypodense lesion in the RIGHT hepatic lobe measuring 2.8 cm (59/2). This was not PSMA avid on recent PET scan. Lesion present on contrast CT 05/31/2022. No new hepatic lesions. Pancreas: Pancreas is normal. No ductal dilatation. No pancreatic inflammation. Spleen:  Normal spleen Adrenals/urinary tract: Adrenal glands normal. Adrenal glands normal. Kidneys are normal. There is surgical clips in the LEFT suprarenal and posterior renal space. No hydronephrosis. Bladder normal. Stomach/Bowel: Postsurgical change in the gastric antral region. Gastro jejunostomy at this site. No evidence of bowel obstruction. The colon and rectosigmoid colon are normal. Vascular/Lymphatic: Abdominal aorta normal caliber. Calcifications are present. No pelvic lymphadenopathy. No periaortic retroperitoneal adenopathy. Reproductive: Fiducial markers the prostate gland. Other: No free fluid. Musculoskeletal: Multiple sclerotic lesions throughout the pelvis and spine. Several these lesions appear increased in size. For example sclerotic lesion in the mid LEFT iliac bone adjacent the SI joint measures 17 mm compared to 16 mm. Lesion in the RIGHT iliac bone along the SI joint measures 13 mm compared to 8 mm (image 97/2) lesion in the RIGHT iliac wing measuring 8 mm compares to 3 mm. Multiple leads lesions radiotracer avid on comparison BS may PET scan. IMPRESSION: 1. Dominant finding is confluent airspace disease within the LEFT lower lobe and lingula. Findings are most suggestive  of lobar pneumonia. Confluent prostate cancer metastasis is less favored. 2. Widespread sclerotic skeletal metastasis. The size of sclerotic lesions are increased in the pelvis. These lesions are radiotracer avid on comparison FDG PET scan. 3. No evidence of lymphadenopathy. 4. No evidence of liver metastasis. 5. Electronically Signed   By: Genevive Bi M.D.   On: 07/16/2023 16:26   DG Chest Port 1 View Result Date: 07/13/2023 CLINICAL DATA:  Flu-like symptoms with cough and congestion. Undergoing treatment for colon cancer. EXAM: PORTABLE CHEST 1 VIEW COMPARISON:  01/12/2023 FINDINGS: Patchy density throughout the majority of the left lung. Clear right lung. Normal-sized heart. No acute bony abnormality. IMPRESSION:  Extensive left lung pneumonia Electronically Signed   By: Beckie Salts M.D.   On: 07/13/2023 15:08      Subjective:  Patient seen and examined at bedside today.  Hemodynamically stable comfortable today.  Pain is better today.  Feels ready to go home today  Discharge Exam: Vitals:   07/19/23 0910 07/19/23 0911  BP: (!) 152/95   Pulse:    Resp:    Temp:    SpO2:  97%   Vitals:   07/18/23 2002 07/19/23 0451 07/19/23 0910 07/19/23 0911  BP: 138/77 (!) 152/95 (!) 152/95   Pulse: 65 71    Resp: 18 16    Temp: 98.3 F (36.8 C) 98.3 F (36.8 C)    TempSrc:      SpO2: 99% 96%  97%  Weight:      Height:        General: Pt is alert, awake, not in acute distress, chronically deconditioned Cardiovascular: RRR, S1/S2 +, no rubs, no gallops Respiratory: CTA bilaterally, no wheezing, no rhonchi Abdominal: Soft, NT, ND, bowel sounds + Extremities: no edema, no cyanosis    The results of significant diagnostics from this hospitalization (including imaging, microbiology, ancillary and laboratory) are listed below for reference.     Microbiology: Recent Results (from the past 240 hours)  Resp panel by RT-PCR (RSV, Flu A&B, Covid) Anterior Nasal Swab     Status: None   Collection Time: 07/13/23  2:58 PM   Specimen: Anterior Nasal Swab  Result Value Ref Range Status   SARS Coronavirus 2 by RT PCR NEGATIVE NEGATIVE Final    Comment: (NOTE) SARS-CoV-2 target nucleic acids are NOT DETECTED.  The SARS-CoV-2 RNA is generally detectable in upper respiratory specimens during the acute phase of infection. The lowest concentration of SARS-CoV-2 viral copies this assay can detect is 138 copies/mL. A negative result does not preclude SARS-Cov-2 infection and should not be used as the sole basis for treatment or other patient management decisions. A negative result may occur with  improper specimen collection/handling, submission of specimen other than nasopharyngeal swab, presence of  viral mutation(s) within the areas targeted by this assay, and inadequate number of viral copies(<138 copies/mL). A negative result must be combined with clinical observations, patient history, and epidemiological information. The expected result is Negative.  Fact Sheet for Patients:  BloggerCourse.com  Fact Sheet for Healthcare Providers:  SeriousBroker.it  This test is no t yet approved or cleared by the [place] FDA and  has been authorized for detection and/or diagnosis of SARS-CoV-2 by FDA under an Emergency Use Authorization (EUA). This EUA will remain  in effect (meaning this test can be used) for the duration of the COVID-19 declaration under Section 564(b)(1) of the Act, 21 U.S.C.section 360bbb-3(b)(1), unless the authorization is terminated  or revoked sooner.  Influenza A by PCR NEGATIVE NEGATIVE Final   Influenza B by PCR NEGATIVE NEGATIVE Final    Comment: (NOTE) The Xpert Xpress SARS-CoV-2/FLU/RSV plus assay is intended as an aid in the diagnosis of influenza from Nasopharyngeal swab specimens and should not be used as a sole basis for treatment. Nasal washings and aspirates are unacceptable for Xpert Xpress SARS-CoV-2/FLU/RSV testing.  Fact Sheet for Patients: BloggerCourse.com  Fact Sheet for Healthcare Providers: SeriousBroker.it  This test is not yet approved or cleared by the [place] FDA and has been authorized for detection and/or diagnosis of SARS-CoV-2 by FDA under an Emergency Use Authorization (EUA). This EUA will remain in effect (meaning this test can be used) for the duration of the COVID-19 declaration under Section 564(b)(1) of the Act, 21 U.S.C. section 360bbb-3(b)(1), unless the authorization is terminated or revoked.     Resp Syncytial Virus by PCR NEGATIVE NEGATIVE Final    Comment: (NOTE) Fact Sheet for  Patients: BloggerCourse.com  Fact Sheet for Healthcare Providers: SeriousBroker.it  This test is not yet approved or cleared by the [place] FDA and has been authorized for detection and/or diagnosis of SARS-CoV-2 by FDA under an Emergency Use Authorization (EUA). This EUA will remain in effect (meaning this test can be used) for the duration of the COVID-19 declaration under Section 564(b)(1) of the Act, 21 U.S.C. section 360bbb-3(b)(1), unless the authorization is terminated or revoked.  Performed at [facility], [address] [place], [place] [address]   Culture, blood (Routine X 2) w Reflex to ID Panel     Status: Abnormal   Collection Time: 07/13/23  3:34 PM   Specimen: BLOOD  Result Value Ref Range Status   Specimen Description   Final    BLOOD SPECIMEN SOURCE NOT MARKED ON REQUISITION Performed at [facility], [address], [place], [place] [address]    Special Requests   Final    BOTTLES DRAWN AEROBIC AND ANAEROBIC Blood Culture adequate volume Performed at [facility], [address], [place], [place] [address]    Culture  Setup Time   Final    GRAM POSITIVE COCCI ANAEROBIC BOTTLE ONLY CRITICAL RESULT CALLED TO, READ BACK BY AND VERIFIED WITH: PHARMD J GADHIA [id number] AT 1256 BY EC    Culture (A)  Final    MICROCOCCUS SPECIES Standardized susceptibility testing for this organism is not available. Performed at [facility], [address] [place], [place] [address]    Report Status 07/17/2023 FINAL  Final  Blood Culture ID Panel (Reflexed)     Status: None   Collection Time: 07/13/23  3:34 PM  Result Value Ref Range Status   Enterococcus faecalis NOT DETECTED NOT DETECTED Final   Enterococcus Faecium NOT DETECTED NOT DETECTED Final   Listeria monocytogenes NOT DETECTED NOT DETECTED Final   Staphylococcus species NOT DETECTED NOT DETECTED  Final   Staphylococcus aureus (BCID) NOT DETECTED NOT DETECTED Final   Staphylococcus epidermidis NOT DETECTED NOT DETECTED Final   Staphylococcus lugdunensis NOT DETECTED NOT DETECTED Final   Streptococcus species NOT DETECTED NOT DETECTED Final   Streptococcus agalactiae NOT DETECTED NOT DETECTED Final   Streptococcus pneumoniae NOT DETECTED NOT DETECTED Final   Streptococcus pyogenes NOT DETECTED NOT DETECTED Final   A.calcoaceticus-baumannii NOT DETECTED NOT DETECTED Final   Bacteroides fragilis NOT DETECTED NOT DETECTED Final   Enterobacterales NOT DETECTED NOT DETECTED Final   Enterobacter cloacae complex NOT DETECTED NOT DETECTED Final   Escherichia  coli NOT DETECTED NOT DETECTED Final   Klebsiella aerogenes NOT DETECTED NOT DETECTED Final   Klebsiella oxytoca NOT DETECTED NOT DETECTED Final   Klebsiella pneumoniae NOT DETECTED NOT DETECTED Final   Proteus species NOT DETECTED NOT DETECTED Final   Salmonella species NOT DETECTED NOT DETECTED Final   Serratia marcescens NOT DETECTED NOT DETECTED Final   Haemophilus influenzae NOT DETECTED NOT DETECTED Final   Neisseria meningitidis NOT DETECTED NOT DETECTED Final   Pseudomonas aeruginosa NOT DETECTED NOT DETECTED Final   Stenotrophomonas maltophilia NOT DETECTED NOT DETECTED Final   Candida albicans NOT DETECTED NOT DETECTED Final   Candida auris NOT DETECTED NOT DETECTED Final   Candida glabrata NOT DETECTED NOT DETECTED Final   Candida krusei NOT DETECTED NOT DETECTED Final   Candida parapsilosis NOT DETECTED NOT DETECTED Final   Candida tropicalis NOT DETECTED NOT DETECTED Final   Cryptococcus neoformans/gattii NOT DETECTED NOT DETECTED Final    Comment: Performed at [facility], [address] [place], [place] [address]  Culture, blood (Routine X 2) w Reflex to ID Panel     Status: None   Collection Time: 07/13/23  3:57 PM   Specimen: BLOOD  Result Value Ref Range Status   Specimen Description   Final    BLOOD  SPECIMEN SOURCE NOT MARKED ON REQUISITION Performed at [facility], [address] [place], [place] [address]    Special Requests   Final    BOTTLES DRAWN AEROBIC AND ANAEROBIC Blood Culture adequate volume Performed at [facility], [address], [place], [place] [address]    Culture   Final    NO GROWTH 5 DAYS Performed at [facility], [address], [place], [place] [address]    Report Status 07/18/2023 FINAL  Final     Labs: BNP (last 3 results) Recent Labs    01/12/23 1324  BNP 45.7   Basic Metabolic Panel: Recent Labs  Lab 07/13/23 1546 07/13/23 1556 07/14/23 0535 07/15/23 0754 07/18/23 0530 07/18/23 1520 07/19/23 0554  NA 137 140 133* 133* 133*  --  133*  K 2.6* 2.7* 3.8 4.5 5.3*  --  4.8  CL 107 106 101 99 100  --  99  CO2 21*  --  22 24 20*  --  25  GLUCOSE 102* 97 101* 92 129*  --  121*  BUN 11 9 11 10 16   --  21  CREATININE 0.71 0.70 0.66 0.67 0.66  --  0.77  CALCIUM 7.1*  --  8.3* 8.7* 9.4  --  9.2  MG 1.6*  --   --   --   --  2.0  --   PHOS 2.5  --   --   --   --   --   --    Liver Function Tests: Recent Labs  Lab 07/13/23 1546 07/14/23 0535 07/18/23 1520  AST 11* 11* 12*  ALT 7 7 9   ALKPHOS 236* 234* 245*  BILITOT 0.3 0.3 0.4  PROT 6.2* 6.2* 7.3  ALBUMIN 2.3* 2.3* 2.6*   No results for input(s): "LIPASE", "AMYLASE" in the last 168 hours. No results for input(s): "AMMONIA" in the last 168 hours. CBC: Recent Labs  Lab 07/13/23 1458 07/13/23 1556 07/14/23 0535 07/15/23 0754 07/18/23 0530  WBC 7.4  --  5.2 6.1 5.7  HGB 10.6* 9.2* 10.0* 10.5* 12.2*  HCT 32.9* 27.0* 29.8* 31.3* 37.5*  MCV 78.5*  --  77.0* 76.7* 78.8*  PLT 421*  --  362 381 449*   Cardiac Enzymes: No results for input(s): "CKTOTAL", "CKMB", "CKMBINDEX", "TROPONINI" in the last 168 hours. BNP: Invalid input(s): "POCBNP" CBG: Recent Labs  Lab 07/17/23 0747 07/17/23 1155 07/17/23 1627 07/17/23 2038 07/18/23 2026   GLUCAP 97 101* 124* 119* 180*   D-Dimer No results for input(s): "DDIMER" in the last 72 hours. Hgb A1c No results for input(s): "HGBA1C" in the last 72 hours. Lipid Profile No results for input(s): "CHOL", "HDL", "LDLCALC", "TRIG", "CHOLHDL", "LDLDIRECT" in the last 72 hours. Thyroid function studies No results for input(s): "TSH", "T4TOTAL", "T3FREE", "THYROIDAB" in the last 72 hours.  Invalid input(s): "FREET3" Anemia work up No results for input(s): "VITAMINB12", "FOLATE", "FERRITIN", "TIBC", "IRON", "RETICCTPCT" in the last 72 hours. Urinalysis    Component Value Date/Time   COLORURINE YELLOW 11/30/2022 2345   APPEARANCEUR CLEAR 11/30/2022 2345   APPEARANCEUR Turbid (A) 03/01/2021 1225   LABSPEC 1.011 11/30/2022 2345   PHURINE 5.0 11/30/2022 2345   GLUCOSEU NEGATIVE 11/30/2022 2345   HGBUR NEGATIVE 11/30/2022 2345   BILIRUBINUR NEGATIVE 11/30/2022 2345   BILIRUBINUR Negative 03/01/2021 1225   KETONESUR NEGATIVE 11/30/2022 2345   PROTEINUR NEGATIVE 11/30/2022 2345   UROBILINOGEN 0.2 04/10/2020 0940   UROBILINOGEN 0.2 08/13/2014 0934   NITRITE NEGATIVE 11/30/2022 2345   LEUKOCYTESUR NEGATIVE 11/30/2022 2345   Sepsis Labs Recent Labs  Lab 07/13/23 1458 07/14/23 0535 07/15/23 0754 07/18/23 0530  WBC 7.4 5.2 6.1 5.7   Microbiology Recent Results (from the past 240 hours)  Resp panel by RT-PCR (RSV, Flu A&B, Covid) Anterior Nasal Swab     Status: None   Collection Time: 07/13/23  2:58 PM   Specimen: Anterior Nasal Swab  Result Value Ref Range Status   SARS Coronavirus 2 by RT PCR NEGATIVE NEGATIVE Final    Comment: (NOTE) SARS-CoV-2 target nucleic acids are NOT DETECTED.  The SARS-CoV-2 RNA is generally detectable in upper respiratory specimens during the acute phase of infection. The lowest concentration of SARS-CoV-2 viral copies this assay can detect is 138 copies/mL. A negative result does not preclude SARS-Cov-2 infection and should not be used as the  sole basis for treatment or other patient management decisions. A negative result may occur with  improper specimen collection/handling, submission of specimen other than nasopharyngeal swab, presence of viral mutation(s) within the areas targeted by this assay, and inadequate number of viral copies(<138 copies/mL). A negative result must be combined with clinical observations, patient history, and epidemiological information. The expected result is Negative.  Fact Sheet for Patients:  BloggerCourse.com  Fact Sheet for Healthcare Providers:  SeriousBroker.it  This test is no t yet approved or cleared by the [place] FDA and  has been authorized for detection and/or diagnosis of SARS-CoV-2 by FDA under an Emergency Use Authorization (EUA). This EUA will remain  in effect (meaning this test can be used) for the duration of the COVID-19 declaration under Section 564(b)(1) of the Act, 21 U.S.C.section 360bbb-3(b)(1), unless the authorization is terminated  or revoked sooner.       Influenza A by PCR NEGATIVE NEGATIVE Final   Influenza B by PCR NEGATIVE NEGATIVE Final    Comment: (NOTE) The Xpert Xpress SARS-CoV-2/FLU/RSV plus assay is intended as an aid in the diagnosis of influenza from Nasopharyngeal swab specimens and should not be used as a sole basis for treatment. Nasal washings and aspirates are unacceptable for Xpert Xpress SARS-CoV-2/FLU/RSV testing.  Fact Sheet for Patients: BloggerCourse.com  Fact Sheet for Healthcare  Providers: SeriousBroker.it  This test is not yet approved or cleared by the [place] and has been authorized for detection and/or diagnosis of SARS-CoV-2 by FDA under an Emergency Use Authorization (EUA). This EUA will remain in effect (meaning this test can be used) for the duration of the COVID-19 declaration under Section 564(b)(1) of the  Act, 21 U.S.C. section 360bbb-3(b)(1), unless the authorization is terminated or revoked.     Resp Syncytial Virus by PCR NEGATIVE NEGATIVE Final    Comment: (NOTE) Fact Sheet for Patients: BloggerCourse.com  Fact Sheet for Healthcare Providers: SeriousBroker.it  This test is not yet approved or cleared by the [place] FDA and has been authorized for detection and/or diagnosis of SARS-CoV-2 by FDA under an Emergency Use Authorization (EUA). This EUA will remain in effect (meaning this test can be used) for the duration of the COVID-19 declaration under Section 564(b)(1) of the Act, 21 U.S.C. section 360bbb-3(b)(1), unless the authorization is terminated or revoked.  Performed at [facility], [address], [place], [place] [address]   Culture, blood (Routine X 2) w Reflex to ID Panel     Status: Abnormal   Collection Time: 07/13/23  3:34 PM   Specimen: BLOOD  Result Value Ref Range Status   Specimen Description   Final    BLOOD SPECIMEN SOURCE NOT MARKED ON REQUISITION Performed at [facility], [address], [place], [place] [address]    Special Requests   Final    BOTTLES DRAWN AEROBIC AND ANAEROBIC Blood Culture adequate volume Performed at [facility], [address], [place], [place] [address]    Culture  Setup Time   Final    GRAM POSITIVE COCCI ANAEROBIC BOTTLE ONLY CRITICAL RESULT CALLED TO, READ BACK BY AND VERIFIED WITH: PHARMD J GADHIA [id number] AT 1256 BY EC    Culture (A)  Final    MICROCOCCUS SPECIES Standardized susceptibility testing for this organism is not available. Performed at [facility], [address], [place], [place] [address]    Report Status 07/17/2023 FINAL  Final  Blood Culture ID Panel (Reflexed)     Status: None   Collection Time: 07/13/23  3:34 PM  Result Value Ref Range Status   Enterococcus faecalis NOT DETECTED  NOT DETECTED Final   Enterococcus Faecium NOT DETECTED NOT DETECTED Final   Listeria monocytogenes NOT DETECTED NOT DETECTED Final   Staphylococcus species NOT DETECTED NOT DETECTED Final   Staphylococcus aureus (BCID) NOT DETECTED NOT DETECTED Final   Staphylococcus epidermidis NOT DETECTED NOT DETECTED Final   Staphylococcus lugdunensis NOT DETECTED NOT DETECTED Final   Streptococcus species NOT DETECTED NOT DETECTED Final   Streptococcus agalactiae NOT DETECTED NOT DETECTED Final   Streptococcus pneumoniae NOT DETECTED NOT DETECTED Final   Streptococcus pyogenes NOT DETECTED NOT DETECTED Final   A.calcoaceticus-baumannii NOT DETECTED NOT DETECTED Final   Bacteroides fragilis NOT DETECTED NOT DETECTED Final   Enterobacterales NOT DETECTED NOT DETECTED Final   Enterobacter cloacae complex NOT DETECTED NOT DETECTED Final   Escherichia coli NOT DETECTED NOT DETECTED Final   Klebsiella aerogenes NOT DETECTED NOT DETECTED Final   Klebsiella oxytoca NOT DETECTED NOT DETECTED Final   Klebsiella pneumoniae NOT DETECTED NOT DETECTED Final   Proteus species NOT DETECTED NOT DETECTED Final   Salmonella species NOT DETECTED NOT DETECTED Final   Serratia marcescens NOT DETECTED NOT DETECTED Final   Haemophilus influenzae NOT DETECTED NOT DETECTED Final   Neisseria meningitidis NOT  DETECTED NOT DETECTED Final   Pseudomonas aeruginosa NOT DETECTED NOT DETECTED Final   Stenotrophomonas maltophilia NOT DETECTED NOT DETECTED Final   Candida albicans NOT DETECTED NOT DETECTED Final   Candida auris NOT DETECTED NOT DETECTED Final   Candida glabrata NOT DETECTED NOT DETECTED Final   Candida krusei NOT DETECTED NOT DETECTED Final   Candida parapsilosis NOT DETECTED NOT DETECTED Final   Candida tropicalis NOT DETECTED NOT DETECTED Final   Cryptococcus neoformans/gattii NOT DETECTED NOT DETECTED Final    Comment: Performed at [facility], [address], [place], [place] [address]  Culture, blood  (Routine X 2) w Reflex to ID Panel     Status: None   Collection Time: 07/13/23  3:57 PM   Specimen: BLOOD  Result Value Ref Range Status   Specimen Description   Final    BLOOD SPECIMEN SOURCE NOT MARKED ON REQUISITION Performed at [facility], [address], [place], [place] [address]    Special Requests   Final    BOTTLES DRAWN AEROBIC AND ANAEROBIC Blood Culture adequate volume Performed at [facility], [address] [place], [place] [address]    Culture   Final    NO GROWTH 5 DAYS Performed at [facility], [address] [place], [place] [address]    Report Status 07/18/2023 FINAL  Final    Please note: You were cared for by a hospitalist during your hospital stay. Once you are discharged, your primary care physician will handle any further medical issues. Please note that NO REFILLS for any discharge medications will be authorized once you are discharged, as it is imperative that you return to your primary care physician (or establish a relationship with a primary care physician if you do not have one) for your post hospital discharge needs so that they can reassess your need for medications and monitor your lab values.    Time coordinating discharge: 40 minutes  SIGNED:   Burnadette Pop, MD  [facility] 07/19/2023, 11:30 AM Pager [phone]  If 7PM-7AM, please contact night-coverage [email or web address] Password [email or web address]

## 2023-07-20 ENCOUNTER — Telehealth: Payer: Self-pay

## 2023-07-20 ENCOUNTER — Other Ambulatory Visit (HOSPITAL_COMMUNITY): Payer: Self-pay

## 2023-07-20 NOTE — Telephone Encounter (Signed)
 Per Ginger Carne will call back to schedule.

## 2023-07-20 NOTE — Telephone Encounter (Signed)
 Copied from CRM [id number]. Topic: Clinical - Home Health Verbal Orders >> Jul 20, 2023  3:19 PM Gery Pray wrote: Caller/Agency: [facility] Callback Number: [phone] Service Requested: Physical Therapy Frequency: 1 week 6  Any new concerns about the patient? No

## 2023-07-21 ENCOUNTER — Telehealth: Payer: Self-pay | Admitting: Nurse Practitioner

## 2023-07-21 NOTE — Telephone Encounter (Signed)
 Copied from CRM [id number]. Topic: Clinical - Home Health Verbal Orders >> Jul 21, 2023  1:15 PM Thomes Dinning wrote: Caller/Agency: [facility]  Callback Number: [phone]  Service Requested: Physical Therapy  Frequency: 1 week 6  Any new concerns about the patient? No

## 2023-07-24 ENCOUNTER — Telehealth: Payer: Self-pay

## 2023-07-24 NOTE — Telephone Encounter (Signed)
 Copied from CRM [id number]. Topic: Clinical - Home Health Verbal Orders >> Jul 21, 2023  1:15 PM Thomes Dinning wrote: Caller/Agency: [facility]  Callback Number: [phone]  Service Requested: Physical Therapy  Frequency: 1 week 6  Any new concerns about the patient? No   Please advise [facility]

## 2023-07-24 NOTE — Telephone Encounter (Signed)
 Copied from CRM [id number]. Topic: Clinical - Home Health Verbal Orders >> Jul 21, 2023  1:15 PM Thomes Dinning wrote: Caller/Agency: [facility]  Callback Number: [phone]  Service Requested: Physical Therapy  Frequency: 1 week 6  Any new concerns about the patient? No

## 2023-07-24 NOTE — Progress Notes (Signed)
 RN spoke with patient to remind him of upcoming appointment for Lupron for 3/4 and provided transportation's number to coordinate for pick up since he is established.

## 2023-07-25 ENCOUNTER — Encounter (HOSPITAL_COMMUNITY): Payer: Self-pay

## 2023-07-25 ENCOUNTER — Other Ambulatory Visit: Payer: Self-pay

## 2023-07-25 ENCOUNTER — Inpatient Hospital Stay: Payer: 59 | Attending: Radiation Oncology

## 2023-07-25 ENCOUNTER — Emergency Department (HOSPITAL_COMMUNITY)

## 2023-07-25 ENCOUNTER — Inpatient Hospital Stay

## 2023-07-25 ENCOUNTER — Telehealth: Payer: Self-pay | Admitting: Nurse Practitioner

## 2023-07-25 ENCOUNTER — Emergency Department (HOSPITAL_COMMUNITY): Admission: EM | Admit: 2023-07-25 | Discharge: 2023-07-25 | Disposition: A | Discharge status: Home / Self Care

## 2023-07-25 VITALS — BP 107/67 | HR 86 | Temp 98.9°F | Resp 17

## 2023-07-25 DIAGNOSIS — D519 Vitamin B12 deficiency anemia, unspecified: Secondary | ICD-10-CM | POA: Insufficient documentation

## 2023-07-25 DIAGNOSIS — C61 Malignant neoplasm of prostate: Secondary | ICD-10-CM | POA: Insufficient documentation

## 2023-07-25 DIAGNOSIS — K5900 Constipation, unspecified: Secondary | ICD-10-CM | POA: Insufficient documentation

## 2023-07-25 DIAGNOSIS — C7951 Secondary malignant neoplasm of bone: Secondary | ICD-10-CM | POA: Insufficient documentation

## 2023-07-25 DIAGNOSIS — G479 Sleep disorder, unspecified: Secondary | ICD-10-CM | POA: Insufficient documentation

## 2023-07-25 DIAGNOSIS — F17210 Nicotine dependence, cigarettes, uncomplicated: Secondary | ICD-10-CM | POA: Insufficient documentation

## 2023-07-25 DIAGNOSIS — R5383 Other fatigue: Secondary | ICD-10-CM | POA: Diagnosis present

## 2023-07-25 DIAGNOSIS — J189 Pneumonia, unspecified organism: Secondary | ICD-10-CM | POA: Insufficient documentation

## 2023-07-25 DIAGNOSIS — J181 Lobar pneumonia, unspecified organism: Secondary | ICD-10-CM | POA: Insufficient documentation

## 2023-07-25 DIAGNOSIS — Z79899 Other long term (current) drug therapy: Secondary | ICD-10-CM | POA: Insufficient documentation

## 2023-07-25 DIAGNOSIS — R0989 Other specified symptoms and signs involving the circulatory and respiratory systems: Secondary | ICD-10-CM | POA: Insufficient documentation

## 2023-07-25 DIAGNOSIS — G893 Neoplasm related pain (acute) (chronic): Secondary | ICD-10-CM | POA: Insufficient documentation

## 2023-07-25 DIAGNOSIS — Z85038 Personal history of other malignant neoplasm of large intestine: Secondary | ICD-10-CM | POA: Diagnosis not present

## 2023-07-25 LAB — COMPREHENSIVE METABOLIC PANEL
ALT: 12 U/L (ref 0–44)
AST: 11 U/L — ABNORMAL LOW (ref 15–41)
Albumin: 2.6 g/dL — ABNORMAL LOW (ref 3.5–5.0)
Alkaline Phosphatase: 331 U/L — ABNORMAL HIGH (ref 38–126)
Anion gap: 10 (ref 5–15)
BUN: 14 mg/dL (ref 8–23)
CO2: 22 mmol/L (ref 22–32)
Calcium: 8.7 mg/dL — ABNORMAL LOW (ref 8.9–10.3)
Chloride: 99 mmol/L (ref 98–111)
Creatinine, Ser: 0.78 mg/dL (ref 0.61–1.24)
GFR, Estimated: >60 mL/min (ref >60)
Glucose, Bld: 96 mg/dL (ref 70–99)
Potassium: 4.1 mmol/L (ref 3.5–5.1)
Sodium: 131 mmol/L — ABNORMAL LOW (ref 135–145)
Total Bilirubin: 0.3 mg/dL (ref 0.0–1.2)
Total Protein: 7.1 g/dL (ref 6.5–8.1)

## 2023-07-25 LAB — CBC WITH DIFFERENTIAL/PLATELET
Abs Immature Granulocytes: 0.03 10*3/uL (ref 0.00–0.07)
Basophils Absolute: 0 10*3/uL (ref 0.0–0.1)
Basophils Relative: 0 %
Eosinophils Absolute: 0.1 10*3/uL (ref 0.0–0.5)
Eosinophils Relative: 2 %
HCT: 32.2 % — ABNORMAL LOW (ref 39.0–52.0)
Hemoglobin: 10.9 g/dL — ABNORMAL LOW (ref 13.0–17.0)
Immature Granulocytes: 1 %
Lymphocytes Relative: 7 %
Lymphs Abs: 0.5 10*3/uL — ABNORMAL LOW (ref 0.7–4.0)
MCH: 25.6 pg — ABNORMAL LOW (ref 26.0–34.0)
MCHC: 33.9 g/dL (ref 30.0–36.0)
MCV: 75.6 fL — ABNORMAL LOW (ref 80.0–100.0)
Monocytes Absolute: 0.9 10*3/uL (ref 0.1–1.0)
Monocytes Relative: 13 %
Neutro Abs: 5.2 10*3/uL (ref 1.7–7.7)
Neutrophils Relative %: 77 %
Platelets: 452 10*3/uL — ABNORMAL HIGH (ref 150–400)
RBC: 4.26 MIL/uL (ref 4.22–5.81)
RDW: 14.4 % (ref 11.5–15.5)
WBC: 6.7 10*3/uL (ref 4.0–10.5)
nRBC: 0 % (ref 0.0–0.2)

## 2023-07-25 LAB — MAGNESIUM: Magnesium: 1.8 mg/dL (ref 1.7–2.4)

## 2023-07-25 LAB — RESP PANEL BY RT-PCR (RSV, FLU A&B, COVID)  RVPGX2
Influenza A by PCR: NEGATIVE
Influenza B by PCR: NEGATIVE
Resp Syncytial Virus by PCR: NEGATIVE
SARS Coronavirus 2 by RT PCR: NEGATIVE

## 2023-07-25 MED ORDER — SODIUM CHLORIDE 0.9 % IV BOLUS
1000.0000 mL | Freq: Once | INTRAVENOUS | Status: AC
  Administered 2023-07-25: 1000 mL via INTRAVENOUS

## 2023-07-25 MED ORDER — AMOXICILLIN-POT CLAVULANATE 875-125 MG PO TABS
1.0000 | ORAL_TABLET | Freq: Two times a day (BID) | ORAL | 0 refills | Status: AC
  Filled 2023-07-25: qty 13, 7d supply, fill #0

## 2023-07-25 MED ORDER — IBUPROFEN 200 MG PO TABS
600.0000 mg | ORAL_TABLET | Freq: Once | ORAL | Status: AC
  Administered 2023-07-25: 600 mg via ORAL
  Filled 2023-07-25: qty 3

## 2023-07-25 MED ORDER — LEUPROLIDE ACETATE (3 MONTH) 22.5 MG ~~LOC~~ KIT
22.5000 mg | PACK | Freq: Once | SUBCUTANEOUS | Status: AC
  Administered 2023-07-25: 22.5 mg via SUBCUTANEOUS
  Filled 2023-07-25: qty 22.5

## 2023-07-25 MED ORDER — AMOXICILLIN-POT CLAVULANATE 875-125 MG PO TABS
1.0000 | ORAL_TABLET | Freq: Once | ORAL | Status: AC
  Administered 2023-07-25: 1 via ORAL
  Filled 2023-07-25: qty 1

## 2023-07-25 MED ORDER — FENTANYL CITRATE PF 50 MCG/ML IJ SOSY
25.0000 ug | PREFILLED_SYRINGE | Freq: Once | INTRAMUSCULAR | Status: AC
  Administered 2023-07-25: 25 ug via INTRAVENOUS
  Filled 2023-07-25: qty 1

## 2023-07-25 MED ORDER — ONDANSETRON HCL 4 MG/2ML IJ SOLN
4.0000 mg | Freq: Once | INTRAMUSCULAR | Status: AC
Start: 2023-07-25 — End: 2023-07-25
  Administered 2023-07-25: 4 mg via INTRAVENOUS
  Filled 2023-07-25: qty 2

## 2023-07-25 NOTE — ED Triage Notes (Signed)
 Pt states that he was recently diagnosed with the flu and has had continued fatigue, generalized weakness, N/V/D. Pt states that he is a cancer patient not currently receiving chemo or radiation. C/o pain to his L rib cage and abdomen area

## 2023-07-25 NOTE — ED Provider Triage Note (Signed)
 Emergency Medicine Provider Triage Evaluation Note  ALVIS EDGELL , a 68 y.o. male  was evaluated in triage.  Pt complains of weakness. Patient reportedly was diagnosed with influenza about 1 week ago and has had no improvement in symptoms. Endorses N/V/D off and on since then. States that he is concerned that he is not clearing this infection. Currently a cancer patient but not currently receiving chemotherapy or radiation. Endorsing some SOB.  Review of Systems  Positive: As above Negative: As above  Physical Exam  BP 121/82 (BP Location: Left Arm)   Pulse 89   Temp (!) 97.4 F (36.3 C) (Oral)   Resp 19   SpO2 100%  Gen:   Awake, no distress, uncomfortable Resp:  Normal effort  MSK:   Moves extremities without difficulty  Other:    Medical Decision Making  Medically screening exam initiated at 2:22 PM.  Appropriate orders placed.  Tibor Lemmons Noyola was informed that the remainder of the evaluation will be completed by another provider, this initial triage assessment does not replace that evaluation, and the importance of remaining in the ED until their evaluation is complete.     Smitty Knudsen, PA-C 07/25/23 1423

## 2023-07-25 NOTE — ED Notes (Signed)
 Reviewed D/C information with the patient, pt verbalized understanding. No additional concerns at this time.

## 2023-07-25 NOTE — Telephone Encounter (Signed)
 Copied from CRM [id number]. Topic: Appointments - Scheduling Inquiry for Clinic >> Jul 25, 2023 12:19 PM Geroge Baseman wrote: Reason for CRM: Boneta Lucks, registered nurse with time care, called to check appointments for mutual patient. She needed to see if patient had scheduled a hospital follow up with Tonya. I did not see any in the appointment desk. She states this patient does need to be scheduled and asked if he could be called to make this appointment. She also  noted he was discharged from [facility] on 07/19/23, seen for pneumonia.

## 2023-07-25 NOTE — Discharge Instructions (Signed)
 You were seen in the emergency department today for concerns of fatigue and cough. You appear to still have findings of pneumonia in your left lung, although this is improving. Given this finding, I would recommend prolonging your antibiotic coverage for another week to ensure resolution of your symptoms. Please take this as prescribed. Return to the ER for any concerns of new or worsening symptoms.

## 2023-07-25 NOTE — ED Provider Notes (Signed)
 [facility]ENC[facility] AT [facility] Provider Note   CSN: [id number] Arrival date & time: 07/25/23  1320     History Chief Complaint  Patient presents with   Fatigue    Troy Mclaughlin is a 68 y.o. male.  Patient with past history significant for malignant neoplasm of the colon, recent pneumonia admission to the hospital, here with concerns of generalized fatigue.  States that since he has been discharged, still having difficulty with baseline energy.  Endorsing some nausea, vomiting, diarrhea.  He is concerned about hydration given that he has not been eating and drinking nearly close to baseline.  Endorsing some low-grade fevers but denies any bodyaches or chills.  Endorsing pain to the left lower rib cage/left upper abdomen.  HPI     Home Medications Prior to Admission medications   Medication Sig Start Date End Date Taking? Authorizing Provider  amoxicillin-clavulanate (AUGMENTIN) 875-125 MG tablet Take 1 tablet by mouth every 12 (twelve) hours for 7 days. 07/25/23 08/02/23 Yes Smitty Knudsen, PA-C  acetaminophen (TYLENOL) 500 MG tablet Take 2 tablets (1,000 mg total) by mouth every 6 (six) hours as needed for moderate pain or mild pain. 05/05/21   Fayrene Helper, PA-C  amLODipine (NORVASC) 10 MG tablet Take 1 tablet (10 mg total) by mouth every morning. 07/19/23   Burnadette Pop, MD  atorvastatin (LIPITOR) 10 MG tablet Take 1 tablet (10 mg total) by mouth every morning. 01/12/23   Loetta Rough, MD  carvedilol (COREG) 12.5 MG tablet Take 1 tablet (12.5 mg total) by mouth 2 (two) times daily with a meal. 07/19/23   Burnadette Pop, MD  cetirizine (ZYRTEC) 10 MG tablet Take 1 tablet (10 mg total) by mouth daily. 06/07/23   Ivonne Andrew, NP  cyanocobalamin (VITAMIN B12) 1000 MCG tablet Take 1 tablet (1,000 mcg total) by mouth daily. 05/22/23   Melven Sartorius, MD  darolutamide (NUBEQA) 300 MG tablet Take 2 tablets (600 mg total) by mouth 2 (two) times daily with a  meal. 06/19/23   Melven Sartorius, MD  dexamethasone (DECADRON) 2 MG tablet Take 1 tablet (2 mg total) by mouth daily for 7 days. 07/20/23 07/27/23  Burnadette Pop, MD  HYDROmorphone (DILAUDID) 2 MG tablet Take 1 tablet (2 mg total) by mouth every 4 (four) hours as needed for moderate pain (pain score 4-6) (Breakthrough pain). 07/19/23   Burnadette Pop, MD  naloxegol oxalate (MOVANTIK) 12.5 MG TABS tablet Take 1 tablet (12.5 mg total) by mouth daily. 07/19/23   Edsel Petrin, DO  nicotine (NICODERM CQ - DOSED IN MG/24 HOURS) 14 mg/24hr patch Place 1 patch (14 mg total) onto the skin daily. Patient not taking: Reported on 07/14/2023 04/10/20   Marcine Matar, MD  nitroGLYCERIN (NITROSTAT) 0.4 MG SL tablet DISSOLVE 1 TABLET UNDER THE TONGUE EVERY 5 MINUTES AS NEEDED FOR CHEST PAIN. DO NOT EXCEED A TOTAL OF 3 DOSES IN 15 MINUTES. 02/03/21   Marcine Matar, MD  ondansetron (ZOFRAN) 4 MG tablet Take 1 tablet (4 mg total) by mouth every 8 (eight) hours as needed for nausea 08/12/22     oxyCODONE ER (XTAMPZA ER) 9 MG C12A Take 9 mg by mouth 2 (two) times daily for 7 days. 07/19/23 07/26/23  Burnadette Pop, MD  polyethylene glycol powder (MIRALAX) 17 GM/SCOOP powder Take 17 grams dissolved in liquid by mouth daily. 07/19/23   Burnadette Pop, MD  senna-docusate (SENOKOT-S) 8.6-50 MG tablet Take 1 tablet by mouth 2 (  two) times daily. 07/19/23   Burnadette Pop, MD  sildenafil (VIAGRA) 100 MG tablet Take 1 tablet by mouth 30 minutes before sexual activity as needed for erectile dysfunction 07/19/23   Edsel Petrin, DO  terbinafine (LAMISIL) 1 % cream Apply topically 2 (two) times daily. 07/19/23   Edsel Petrin, DO  traZODone (DESYREL) 50 MG tablet Take 1 tablet (50 mg total) by mouth at bedtime. 07/19/23   Edsel Petrin, DO      Allergies    Lisinopril and Metformin and related    Review of Systems   Review of Systems  Respiratory:  Positive for cough.   All other systems reviewed  and are negative.   Physical Exam Updated Vital Signs BP 108/68   Pulse 79   Temp 98.7 F (37.1 C) (Oral)   Resp 16   SpO2 98%  Physical Exam Vitals and nursing note reviewed.  Constitutional:      General: He is not in acute distress.    Appearance: He is well-developed.  HENT:     Head: Normocephalic and atraumatic.  Eyes:     Conjunctiva/sclera: Conjunctivae normal.  Cardiovascular:     Rate and Rhythm: Normal rate and regular rhythm.     Heart sounds: No murmur heard. Pulmonary:     Effort: Pulmonary effort is normal. No respiratory distress.     Breath sounds: Rales present.     Comments: Rales in left lung fields Abdominal:     General: Abdomen is flat. Bowel sounds are normal. There is no distension.     Palpations: Abdomen is soft.     Tenderness: There is no abdominal tenderness. There is no guarding.  Musculoskeletal:        General: No swelling.     Cervical back: Neck supple.  Skin:    General: Skin is warm and dry.     Capillary Refill: Capillary refill takes less than 2 seconds.  Neurological:     Mental Status: He is alert.  Psychiatric:        Mood and Affect: Mood normal.     ED Results / Procedures / Treatments   Labs (all labs ordered are listed, but only abnormal results are displayed) Labs Reviewed  CBC WITH DIFFERENTIAL/PLATELET - Abnormal; Notable for the following components:      Result Value   Hemoglobin 10.9 (*)    HCT 32.2 (*)    MCV 75.6 (*)    MCH 25.6 (*)    Platelets 452 (*)    Lymphs Abs 0.5 (*)    All other components within normal limits  COMPREHENSIVE METABOLIC PANEL - Abnormal; Notable for the following components:   Sodium 131 (*)    Calcium 8.7 (*)    Albumin 2.6 (*)    AST 11 (*)    Alkaline Phosphatase 331 (*)    All other components within normal limits  RESP PANEL BY RT-PCR (RSV, FLU A&B, COVID)  RVPGX2  MAGNESIUM    EKG None  Radiology DG Chest 2 View Result Date: 07/25/2023 CLINICAL DATA:  Recent  diagnosis of influenza with persistent fatigue, weakness EXAM: CHEST - 2 VIEW COMPARISON:  Chest radiograph dated 07/13/2023 FINDINGS: Improved left lung aeration with decreased diffuse patchy and confluent opacities. No new focal consolidations. Unchanged mild thickening of the left lateral pleura. No pneumothorax. The heart size and mediastinal contours are within normal limits. No acute osseous abnormality. Old right posterior rib fractures. Left upper quadrant surgical clips. IMPRESSION: 1.  Improved left lung aeration with decreased diffuse patchy and confluent opacities. No new focal consolidations. 2. Unchanged mild thickening of the left lateral pleura, possibly a small loculated pleural effusion. Electronically Signed   By: Agustin Cree M.D.   On: 07/25/2023 17:24    Procedures Procedures    Medications Ordered in ED Medications  sodium chloride 0.9 % bolus 1,000 mL (0 mLs Intravenous Stopped 07/25/23 1830)  ondansetron (ZOFRAN) injection 4 mg (4 mg Intravenous Given 07/25/23 1714)  ibuprofen (ADVIL) tablet 600 mg (600 mg Oral Given 07/25/23 1725)  fentaNYL (SUBLIMAZE) injection 25 mcg (25 mcg Intravenous Given 07/25/23 1945)  amoxicillin-clavulanate (AUGMENTIN) 875-125 MG per tablet 1 tablet (1 tablet Oral Given 07/25/23 1945)    ED Course/ Medical Decision Making/ A&P                                 Medical Decision Making Amount and/or Complexity of Data Reviewed Labs: ordered. Radiology: ordered.  Risk OTC drugs. Prescription drug management.   This patient presents to the ED for concern of fatigue.  Differential diagnosis includes COVID-19, pneumonia, bronchitis, sepsis   Lab Tests:  I Ordered, and personally interpreted labs.  The pertinent results include: CBC with hemoglobin of 10.9, CMP with mild hydration with sodium 131 but no evidence of AKI, magnesium normal at 1.8, respiratory panel negative   Imaging Studies ordered:  I ordered imaging studies including chest  x-ray I independently visualized and interpreted imaging which showed 1. Improved left lung aeration with decreased diffuse patchy and confluent opacities. No new focal consolidations. 2. Unchanged mild thickening of the left lateral pleura, possibly a small loculated pleural effusion. I agree with the radiologist interpretation   Medicines ordered and prescription drug management:  I ordered medication including fluids, Zofran, Toradol, Fentanyl, Augmentin for hydration, nausea, pain, and pneumonia  Reevaluation of the patient after these medicines showed that the patient improved I have reviewed the patients home medicines and have made adjustments as needed   Problem List / ED Course:  Patient presents to the ED with concerns of fatigue. States he was recently admitted and discharged for pneumonia. He was discharged home after being hospitalized for a total of 6 days. He states that since discharged, has felt unwell with some vomiting and diarrhea, but denies any significant chest pain or shortness of breath. States that he is still having productive coughing with yellow phlegm. Exam reveals rales present in the left lung fields. Right lung fields unremarkable. Abdomen nontender and soft. Will obtain xray imaging and labs for further evaluation  Labs are unremarkable. Xray shows improved findings compared to 07/13/2023 in which initial pneumonia was seen resulting in hospitalization. However, some areas of consolidation and a small left pleural effusion are seen. Given this finding and continued fatigue, coughing, nausea, vomiting, and diarrhea, will prolong course of antibiotics for one more week. Advised patient to take this medication as prescribed and return to the ED for any concerns of new or worsening symptoms. Patient otherwise stable and discharged home with plans for outpatient follow up.   Final Clinical Impression(s) / ED Diagnoses Final diagnoses:  Pneumonia of left lung due to  infectious organism, unspecified part of lung    Rx / DC Orders ED Discharge Orders          Ordered    amoxicillin-clavulanate (AUGMENTIN) 875-125 MG tablet  Every 12 hours  07/25/23 1940              Smitty Knudsen, PA-C 07/26/23 2309    Charlynne Pander, MD 07/28/23 [phone]

## 2023-07-26 ENCOUNTER — Other Ambulatory Visit (HOSPITAL_COMMUNITY): Payer: Self-pay

## 2023-07-27 ENCOUNTER — Telehealth: Payer: Self-pay

## 2023-07-27 NOTE — Transitions of Care (Post Inpatient/ED Visit) (Signed)
 07/27/2023  Name: Troy Mclaughlin MRN: [id number] DOB: Dec 01, 1955  Today's TOC FU Call Status:   Patient's Name and Date of Birth confirmed.  Transition Care Management Follow-up Telephone Call Date of Discharge: 07/25/23 Discharge Facility: [facility]) Type of Discharge: Emergency Department Reason for ED Visit: Other: How have you been since you were released from the hospital?: Same Any questions or concerns?: No  Items Reviewed: Did you receive and understand the discharge instructions provided?: Yes Medications obtained,verified, and reconciled?: No Any new allergies since your discharge?: No Dietary orders reviewed?: No Do you have support at home?: Yes People in Home: significant other Name of Support/Comfort Primary Source: Wife  Medications Reviewed Today: Medications Reviewed Today     Reviewed by Veneta Penton, CMA (Certified Medical Assistant) on 07/27/23 at 1503  Med List Status: <None>   Medication Order Taking? Sig Documenting Provider Last Dose Status Informant  acetaminophen (TYLENOL) 500 MG tablet [id number] Yes Take 2 tablets (1,000 mg total) by mouth every 6 (six) hours as needed for moderate pain or mild pain. Fayrene Helper, PA-C Taking Active Self, Pharmacy Records  amLODipine (NORVASC) 10 MG tablet [id number] Yes Take 1 tablet (10 mg total) by mouth every morning. Burnadette Pop, MD Taking Active   amoxicillin-clavulanate (AUGMENTIN) 875-125 MG tablet [id number] Yes Take 1 tablet by mouth every 12 (twelve) hours for 7 days. Smitty Knudsen, PA-C Taking Active   atorvastatin (LIPITOR) 10 MG tablet [id number] Yes Take 1 tablet (10 mg total) by mouth every morning. Loetta Rough, MD Taking Active Self, Pharmacy Records           Med Note (CRUTHIS, CHLOE C   Fri Jul 14, 2023  8:04 AM) Pt is adamant he is still taking this medication daily. Dispense report does not support this claim.   carvedilol (COREG) 12.5 MG tablet [id number] No Take 1 tablet (12.5 mg  total) by mouth 2 (two) times daily with a meal.  Patient not taking: Reported on 07/27/2023   Burnadette Pop, MD Not Taking Active   cetirizine (ZYRTEC) 10 MG tablet [id number] Yes Take 1 tablet (10 mg total) by mouth daily. Ivonne Andrew, NP Taking Active Self, Pharmacy Records  cyanocobalamin (VITAMIN B12) 1000 MCG tablet [id number] Yes Take 1 tablet (1,000 mcg total) by mouth daily. Melven Sartorius, MD Taking Active Self, Pharmacy Records  darolutamide [facility]) 300 MG tablet [id number]  Take 2 tablets (600 mg total) by mouth 2 (two) times daily with a meal. Melven Sartorius, MD  Active Self, Pharmacy Records  dexamethasone (DECADRON) 2 MG tablet [id number] Yes Take 1 tablet (2 mg total) by mouth daily for 7 days. Burnadette Pop, MD Taking Active   HYDROmorphone (DILAUDID) 2 MG tablet [id number] Yes Take 1 tablet (2 mg total) by mouth every 4 (four) hours as needed for moderate pain (pain score 4-6) (Breakthrough pain). Burnadette Pop, MD Taking Active   naloxegol oxalate (MOVANTIK) 12.5 MG TABS tablet [id number] Yes Take 1 tablet (12.5 mg total) by mouth daily. Edsel Petrin, DO Taking Active   nicotine (NICODERM CQ - DOSED IN MG/24 HOURS) 14 mg/24hr patch [id number] No Place 1 patch (14 mg total) onto the skin daily.  Patient not taking: Reported on 07/14/2023   Marcine Matar, MD Not Taking Active Self, Pharmacy Records  nitroGLYCERIN (NITROSTAT) 0.4 MG SL tablet [id number] Yes DISSOLVE 1 TABLET UNDER THE TONGUE EVERY 5 MINUTES AS NEEDED FOR CHEST PAIN. DO NOT EXCEED A TOTAL OF 3 DOSES  IN 15 MINUTES. Marcine Matar, MD Taking Active Self, Pharmacy Records           Med Note (CRUTHIS, CHLOE C   Fri Jul 14, 2023  8:06 AM) Last dose is unknown   ondansetron (ZOFRAN) 4 MG tablet [id number] Yes Take 1 tablet (4 mg total) by mouth every 8 (eight) hours as needed for nausea  Taking Active Self, Pharmacy Records  polyethylene glycol powder (MIRALAX) 17 GM/SCOOP powder [id number] Yes Take 17  grams dissolved in liquid by mouth daily. Burnadette Pop, MD Taking Active   senna-docusate (SENOKOT-S) 8.6-50 MG tablet [id number] Yes Take 1 tablet by mouth 2 (two) times daily. Burnadette Pop, MD Taking Active   sildenafil (VIAGRA) 100 MG tablet [id number] Yes Take 1 tablet by mouth 30 minutes before sexual activity as needed for erectile dysfunction Edsel Petrin, DO Taking Active   terbinafine (LAMISIL) 1 % cream [id number] No Apply topically 2 (two) times daily.  Patient not taking: Reported on 07/27/2023   Edsel Petrin, DO Not Taking Active   traZODone (DESYREL) 50 MG tablet [id number] Yes Take 1 tablet (50 mg total) by mouth at bedtime. Edsel Petrin, DO Taking Active             Home Care and Equipment/Supplies: Were Home Health Services Ordered?: Yes Has Agency set up a time to come to your home?: Yes First Home Health Visit Date: 07/27/23 Any new equipment or medical supplies ordered?: No  Functional Questionnaire: Do you need assistance with bathing/showering or dressing?: No Do you need assistance with meal preparation?: No Do you need assistance with eating?: No Do you have difficulty maintaining continence: No Do you need assistance with getting out of bed/getting out of a chair/moving?: No Do you have difficulty managing or taking your medications?: No  Follow up appointments reviewed: PCP Follow-up appointment confirmed?: Yes Date of PCP follow-up appointment?: 08/04/23 Specialist Hospital Follow-up appointment confirmed?: No Do you need transportation to your follow-up appointment?: Yes Do you understand care options if your condition(s) worsen?: Yes-patient verbalized understanding    SIGNATURE Addelynn Batte, RMA
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
## 2023-07-31 ENCOUNTER — Inpatient Hospital Stay: Admitting: Nurse Practitioner

## 2023-07-31 ENCOUNTER — Other Ambulatory Visit (HOSPITAL_COMMUNITY): Payer: Self-pay

## 2023-08-01 ENCOUNTER — Telehealth: Payer: Self-pay

## 2023-08-01 NOTE — Progress Notes (Addendum)
 Patient did not attend appointment for palliative care on 3/10.   RN attempted to reach out to patient, no answer.  Voicemail left.     RN placed requested to have palliative care consult rescheduled.

## 2023-08-01 NOTE — Telephone Encounter (Signed)
 Copied from CRM [id number]. Topic: Clinical - Home Health Verbal Orders >> Aug 01, 2023  8:54 AM Nada Libman H wrote: Caller/Agency: Richelle Ito Number: [phone] Service Requested: Skilled Nursing Frequency: 1 week x 4 Any new concerns about the patient? No

## 2023-08-02 ENCOUNTER — Other Ambulatory Visit (HOSPITAL_COMMUNITY): Payer: Self-pay

## 2023-08-04 ENCOUNTER — Inpatient Hospital Stay: Payer: Self-pay | Admitting: Nurse Practitioner

## 2023-08-04 ENCOUNTER — Other Ambulatory Visit: Payer: Self-pay

## 2023-08-04 ENCOUNTER — Other Ambulatory Visit: Payer: Self-pay | Admitting: Nurse Practitioner

## 2023-08-04 ENCOUNTER — Other Ambulatory Visit (HOSPITAL_COMMUNITY): Payer: Self-pay

## 2023-08-04 DIAGNOSIS — I2510 Atherosclerotic heart disease of native coronary artery without angina pectoris: Secondary | ICD-10-CM

## 2023-08-04 MED ORDER — ATORVASTATIN CALCIUM 10 MG PO TABS
10.0000 mg | ORAL_TABLET | Freq: Every morning | ORAL | 0 refills | Status: DC
  Filled 2023-08-04: qty 30, 30d supply, fill #0

## 2023-08-04 MED ORDER — AMLODIPINE BESYLATE 5 MG PO TABS
5.0000 mg | ORAL_TABLET | Freq: Every day | ORAL | 0 refills | Status: DC
  Filled 2023-08-04: qty 90, 90d supply, fill #0

## 2023-08-04 MED ORDER — POLYETHYLENE GLYCOL 3350 17 GM/SCOOP PO POWD
17.0000 g | Freq: Every day | ORAL | 0 refills | Status: DC
  Filled 2023-08-04: qty 238, 14d supply, fill #0

## 2023-08-04 MED ORDER — TRAZODONE HCL 50 MG PO TABS
50.0000 mg | ORAL_TABLET | Freq: Every day | ORAL | 0 refills | Status: DC
  Filled 2023-08-04: qty 30, 30d supply, fill #0

## 2023-08-04 MED ORDER — NITROGLYCERIN 0.4 MG SL SUBL
SUBLINGUAL_TABLET | SUBLINGUAL | 5 refills | Status: DC
  Filled 2023-08-04: qty 25, 8d supply, fill #0

## 2023-08-07 ENCOUNTER — Other Ambulatory Visit: Payer: Self-pay

## 2023-08-07 ENCOUNTER — Other Ambulatory Visit (HOSPITAL_COMMUNITY): Payer: Self-pay

## 2023-08-08 ENCOUNTER — Other Ambulatory Visit (HOSPITAL_COMMUNITY): Payer: Self-pay

## 2023-08-08 ENCOUNTER — Telehealth: Payer: Self-pay

## 2023-08-08 NOTE — Telephone Encounter (Signed)
 TC from [place], NP from pt's facility, stating pt is out of pain medication and is having pain in his LLQ. He has an appt w/ Dr. Cherly Hensen and Palliative Care on 08/15/23. Dr. Cherly Hensen notified and states he will submit pain Rx to [facility]. Meriam Sprague, NP notified.

## 2023-08-09 ENCOUNTER — Other Ambulatory Visit: Payer: Self-pay

## 2023-08-09 ENCOUNTER — Other Ambulatory Visit (HOSPITAL_COMMUNITY): Payer: Self-pay

## 2023-08-11 ENCOUNTER — Other Ambulatory Visit: Payer: Self-pay

## 2023-08-11 ENCOUNTER — Other Ambulatory Visit (HOSPITAL_COMMUNITY): Payer: Self-pay

## 2023-08-11 ENCOUNTER — Other Ambulatory Visit: Payer: Self-pay | Admitting: Pharmacy Technician

## 2023-08-11 NOTE — Progress Notes (Signed)
 Specialty Pharmacy Refill Coordination Note  Troy Mclaughlin is a 68 y.o. male contacted today regarding refills of specialty medication(s) Darolutamide Merleen Nicely)   Patient requested Delivery   Delivery date: 08/15/23   Verified address: [address]  [place]   Medication will be filled on 08/14/23.

## 2023-08-14 ENCOUNTER — Other Ambulatory Visit (HOSPITAL_COMMUNITY): Payer: Self-pay

## 2023-08-14 ENCOUNTER — Other Ambulatory Visit: Payer: Self-pay

## 2023-08-14 NOTE — Progress Notes (Signed)
 RN spoke with patient to remind him up upcoming appointments for 3/25 and encourage him to reach out regarding transportation.   Patient verbalized understanding and has already contacted transportation.    No additional needs at this time.

## 2023-08-15 ENCOUNTER — Inpatient Hospital Stay: Payer: 59

## 2023-08-15 ENCOUNTER — Telehealth: Payer: Self-pay

## 2023-08-15 ENCOUNTER — Inpatient Hospital Stay: Admitting: Nurse Practitioner

## 2023-08-15 ENCOUNTER — Other Ambulatory Visit (HOSPITAL_COMMUNITY): Payer: Self-pay

## 2023-08-15 NOTE — Telephone Encounter (Signed)
 Completed - Provided the transportation service phone number.

## 2023-08-15 NOTE — Telephone Encounter (Signed)
 Patient called in to reschedule his appts as he mentioned he was waiting on his Benedetto Goad. Patient also mentioned transportation services. I reached out to the transportation coordinator and was advised to provide the phone number to [place].

## 2023-08-18 ENCOUNTER — Other Ambulatory Visit: Payer: Self-pay | Admitting: Nurse Practitioner

## 2023-08-18 NOTE — Telephone Encounter (Signed)
 Copied from CRM [id number]. Topic: Clinical - Medication Refill >> Aug 18, 2023 11:58 AM Truddie Crumble wrote: Most Recent Primary Care Visit:  Provider: Ivonne Andrew  Department: [facility]  Visit Type: NEW PATIENT  Date: 06/07/2023  Medication: HYDROmorphone (DILAUDID) 2 MG tablet (could not pend because the order is pending a refill)  Has the patient contacted their pharmacy? Yes (Agent: If no, request that the patient contact the pharmacy for the refill. If patient does not wish to contact the pharmacy document the reason why and proceed with request.) (Agent: If yes, when and what did the pharmacy advise?)  Is this the correct pharmacy for this prescription? Yes If no, delete pharmacy and type the correct one.  This is the patient's preferred pharmacy:   [facility]  [address] [place] [address] [phone]  Has the prescription been filled recently? No  Is the patient out of the medication? Yes  Has the patient been seen for an appointment in the last year OR does the patient have an upcoming appointment? Yes  Can we respond through MyChart? No  Agent: Please be advised that Rx refills may take up to 3 business days. We ask that you follow-up with your pharmacy.

## 2023-08-21 ENCOUNTER — Encounter: Payer: Self-pay | Admitting: Nurse Practitioner

## 2023-08-21 ENCOUNTER — Inpatient Hospital Stay (HOSPITAL_BASED_OUTPATIENT_CLINIC_OR_DEPARTMENT_OTHER)

## 2023-08-21 ENCOUNTER — Inpatient Hospital Stay (HOSPITAL_BASED_OUTPATIENT_CLINIC_OR_DEPARTMENT_OTHER): Admitting: Nurse Practitioner

## 2023-08-21 ENCOUNTER — Inpatient Hospital Stay

## 2023-08-21 ENCOUNTER — Other Ambulatory Visit (HOSPITAL_COMMUNITY): Payer: Self-pay

## 2023-08-21 ENCOUNTER — Other Ambulatory Visit: Payer: Self-pay

## 2023-08-21 VITALS — BP 116/76 | HR 75 | Temp 98.2°F | Resp 16 | Ht 69.0 in | Wt 135.0 lb

## 2023-08-21 DIAGNOSIS — D518 Other vitamin B12 deficiency anemias: Secondary | ICD-10-CM

## 2023-08-21 DIAGNOSIS — G479 Sleep disorder, unspecified: Secondary | ICD-10-CM | POA: Diagnosis not present

## 2023-08-21 DIAGNOSIS — C61 Malignant neoplasm of prostate: Secondary | ICD-10-CM

## 2023-08-21 DIAGNOSIS — Z515 Encounter for palliative care: Secondary | ICD-10-CM | POA: Diagnosis not present

## 2023-08-21 DIAGNOSIS — C7951 Secondary malignant neoplasm of bone: Secondary | ICD-10-CM | POA: Diagnosis not present

## 2023-08-21 DIAGNOSIS — K5900 Constipation, unspecified: Secondary | ICD-10-CM | POA: Diagnosis not present

## 2023-08-21 DIAGNOSIS — E538 Deficiency of other specified B group vitamins: Secondary | ICD-10-CM

## 2023-08-21 DIAGNOSIS — Z79899 Other long term (current) drug therapy: Secondary | ICD-10-CM | POA: Diagnosis not present

## 2023-08-21 DIAGNOSIS — Z9189 Other specified personal risk factors, not elsewhere classified: Secondary | ICD-10-CM

## 2023-08-21 DIAGNOSIS — Z7189 Other specified counseling: Secondary | ICD-10-CM | POA: Diagnosis not present

## 2023-08-21 DIAGNOSIS — R0989 Other specified symptoms and signs involving the circulatory and respiratory systems: Secondary | ICD-10-CM | POA: Diagnosis not present

## 2023-08-21 DIAGNOSIS — G893 Neoplasm related pain (acute) (chronic): Secondary | ICD-10-CM | POA: Diagnosis not present

## 2023-08-21 DIAGNOSIS — K5903 Drug induced constipation: Secondary | ICD-10-CM | POA: Diagnosis not present

## 2023-08-21 DIAGNOSIS — D519 Vitamin B12 deficiency anemia, unspecified: Secondary | ICD-10-CM | POA: Diagnosis not present

## 2023-08-21 DIAGNOSIS — F17210 Nicotine dependence, cigarettes, uncomplicated: Secondary | ICD-10-CM | POA: Diagnosis not present

## 2023-08-21 LAB — VITAMIN B12: Vitamin B-12: 473 pg/mL (ref 180–914)

## 2023-08-21 LAB — CBC WITH DIFFERENTIAL (CANCER CENTER ONLY)
Abs Immature Granulocytes: 0.02 10*3/uL (ref 0.00–0.07)
Basophils Absolute: 0 10*3/uL (ref 0.0–0.1)
Basophils Relative: 0 %
Eosinophils Absolute: 0.1 10*3/uL (ref 0.0–0.5)
Eosinophils Relative: 2 %
HCT: 28 % — ABNORMAL LOW (ref 39.0–52.0)
Hemoglobin: 9.5 g/dL — ABNORMAL LOW (ref 13.0–17.0)
Immature Granulocytes: 1 %
Lymphocytes Relative: 11 %
Lymphs Abs: 0.5 10*3/uL — ABNORMAL LOW (ref 0.7–4.0)
MCH: 24.9 pg — ABNORMAL LOW (ref 26.0–34.0)
MCHC: 33.9 g/dL (ref 30.0–36.0)
MCV: 73.3 fL — ABNORMAL LOW (ref 80.0–100.0)
Monocytes Absolute: 0.5 10*3/uL (ref 0.1–1.0)
Monocytes Relative: 12 %
Neutro Abs: 3.1 10*3/uL (ref 1.7–7.7)
Neutrophils Relative %: 74 %
Platelet Count: 266 10*3/uL (ref 150–400)
RBC: 3.82 MIL/uL — ABNORMAL LOW (ref 4.22–5.81)
RDW: 15.7 % — ABNORMAL HIGH (ref 11.5–15.5)
WBC Count: 4.2 10*3/uL (ref 4.0–10.5)
nRBC: 0 % (ref 0.0–0.2)

## 2023-08-21 LAB — CMP (CANCER CENTER ONLY)
ALT: 6 U/L (ref 0–44)
AST: 9 U/L — ABNORMAL LOW (ref 15–41)
Albumin: 3.4 g/dL — ABNORMAL LOW (ref 3.5–5.0)
Alkaline Phosphatase: 506 U/L — ABNORMAL HIGH (ref 38–126)
Anion gap: 6 (ref 5–15)
BUN: 10 mg/dL (ref 8–23)
CO2: 26 mmol/L (ref 22–32)
Calcium: 8.5 mg/dL — ABNORMAL LOW (ref 8.9–10.3)
Chloride: 109 mmol/L (ref 98–111)
Creatinine: 1 mg/dL (ref 0.61–1.24)
GFR, Estimated: >60 mL/min (ref >60)
Glucose, Bld: 93 mg/dL (ref 70–99)
Potassium: 3.8 mmol/L (ref 3.5–5.1)
Sodium: 141 mmol/L (ref 135–145)
Total Bilirubin: 0.3 mg/dL (ref 0.0–1.2)
Total Protein: 6.1 g/dL — ABNORMAL LOW (ref 6.5–8.1)

## 2023-08-21 LAB — LACTATE DEHYDROGENASE: LDH: 122 U/L (ref 98–192)

## 2023-08-21 MED ORDER — XTAMPZA ER 9 MG PO C12A
9.0000 mg | EXTENDED_RELEASE_CAPSULE | Freq: Two times a day (BID) | ORAL | 0 refills | Status: DC
  Filled 2023-08-21 – 2023-08-22 (×2): qty 60, 30d supply, fill #0

## 2023-08-21 MED ORDER — BENZONATATE 100 MG PO CAPS
100.0000 mg | ORAL_CAPSULE | Freq: Three times a day (TID) | ORAL | 0 refills | Status: DC | PRN
  Filled 2023-08-21 – 2023-08-22 (×2): qty 30, 10d supply, fill #0

## 2023-08-21 MED ORDER — OXYCODONE HCL 10 MG PO TABS
10.0000 mg | ORAL_TABLET | ORAL | 0 refills | Status: DC | PRN
  Filled 2023-08-21 – 2023-08-22 (×2): qty 90, 15d supply, fill #0

## 2023-08-21 MED ORDER — ALBUTEROL SULFATE HFA 108 (90 BASE) MCG/ACT IN AERS
1.0000 | INHALATION_SPRAY | Freq: Four times a day (QID) | RESPIRATORY_TRACT | 2 refills | Status: DC | PRN
  Filled 2023-08-21: qty 6.7, 25d supply, fill #0
  Filled 2023-08-22: qty 18, 25d supply, fill #0

## 2023-08-21 MED ORDER — NALOXEGOL OXALATE 12.5 MG PO TABS
12.5000 mg | ORAL_TABLET | Freq: Every day | ORAL | 3 refills | Status: DC
  Filled 2023-08-21: qty 30, 30d supply, fill #0

## 2023-08-21 NOTE — Assessment & Plan Note (Addendum)
 Was supposed to take darolutamide at 600mg  twice daily but not. Recommend increase to 600 mg twice daily. ADT every 3 months. Last received on 07/25/23 CBC, CMP, PSA, testosterone monthly
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
## 2023-08-21 NOTE — Assessment & Plan Note (Addendum)
 Continue follow up with palliative care with each appointment. Oxycodone and Xtampza refilled by [employer] and senokot as needed

## 2023-08-21 NOTE — Assessment & Plan Note (Addendum)
 Discussed needs to take the darolutamide at right dose.  If continues to progress on correct dosage, further treatment pending on his PS. Pluvicto is now approved for pre-docetaxel. Will need continue assessment. If functional decline, or limited PS, GOC will be discussed again

## 2023-08-21 NOTE — Assessment & Plan Note (Addendum)
 calcium (1000-1200 mg daily from food and supplements) and vitamin D3 (1000 IU daily) Control HTN and HLD, heart healthy diet Weight-bearing exercises (30 minutes per day) Limit alcohol consumption and avoid smoking

## 2023-08-21 NOTE — Assessment & Plan Note (Addendum)
 Neg IF and AP antibodies B12 1000 mcg daily

## 2023-08-21 NOTE — Progress Notes (Signed)
 [facility] OFFICE PROGRESS NOTE  Patient Care Team: Ivonne Andrew, NP as PCP - General (Pulmonary Disease) Cherlyn Cushing, RN as Oncology Nurse Navigator  Troy Mclaughlin is a 68 y.o.male with history of HLD, HTN, DM2 being [facility] for [facility] cancer.   Current diagnosis: mCRPC Germline testing: negative  Somatic testing: no actionable mutation Previous Treatment: 03/2021 LT-ADT with EBRT. Completed radiation in 08/2021. Paliative radiation to L hip and L rib completed in 03/2023. Current treatment: darolutamide. Was first prescribed to start in Nov 2024 but did not. He is now reporting taking only one a day at 300 mg. PSA rising and Alk phos. Discussed and reiterate again needs to take 300 mg twice daily. He expresses understanding.  He has Nubeqa delivered to him monthly. Assessment & Plan Malignant neoplasm of prostate (HCC) Was supposed to take darolutamide at 600mg  twice daily but not. Recommend increase to 600 mg twice daily. ADT every 3 months. Last received on 07/25/23 CBC, CMP, PSA, testosterone monthly Goals of care, counseling/discussion Discussed needs to take the darolutamide at right dose.  If continues to progress on correct dosage, further treatment pending on his PS. Pluvicto is now approved for pre-docetaxel. Will need continue assessment. If functional decline, or limited PS, GOC will be discussed again  Cancer associated pain Continue follow up with palliative care with each appointment. Oxycodone and Xtampza refilled by Lowella Bandy today Miralax and senokot as needed At risk for side effect of medication calcium (1000-1200 mg daily from food and supplements) and vitamin D3 (1000 IU daily) Control HTN and HLD, heart healthy diet Weight-bearing exercises (30 minutes per day) Limit alcohol consumption and avoid smoking Other vitamin B12 deficiency anemia Neg IF and AP antibodies B12 1000 mcg daily  Orders Placed This Encounter  Procedures    CBC with Differential (Cancer Center Only)    Standing Status:   Future    Expiration Date:   08/20/2024   CMP (Cancer Center only)    Standing Status:   Future    Expiration Date:   08/20/2024   Testosterone    Standing Status:   Future    Expiration Date:   08/20/2024   Prostate-Specific AG, Serum    Standing Status:   Future    Expiration Date:   08/20/2024     Melven Sartorius, MD  INTERVAL HISTORY: he returns for treatment follow-up. He has been taking one tab of daro once daily thought that's the dose. He has some nausea and vomiting. Zofran help. Pain is more on the left side. No bloody stool or urine. No new weakness and walking fine.   Oncology History  Malignant neoplasm of prostate (HCC)  04/11/2020 Tumor Marker   PSA 115   09/03/2020 Tumor Marker   PSA 113   10/27/2020 Cancer Staging   Staging form: Prostate, AJCC 8th Edition - Clinical stage from 10/27/2020: Stage IIIC (cT2a, cN0, cM0, PSA: 113, Grade Group: 5) - Signed by Marcello Fennel, PA-C on 12/15/2020 Histopathologic type: Adenocarcinoma, NOS Stage prefix: Initial diagnosis Prostate specific antigen (PSA) range: 20 or greater Gleason primary pattern: 5 Gleason secondary pattern: 4 Gleason score: 9 Histologic grading system: 5 grade system Number of biopsy cores examined: 12 Number of biopsy cores positive: 12 Location of positive needle core biopsies: Both sides   10/27/2020 Imaging   From outside report CT AP reported no metastatic disease.   11/17/2020 Imaging   Bone scan Focal abnormal uptake in the posterior portion of  the right side of the upper cervical spine most consistent with degenerative changes and 2 foci of abnormal uptake in the posterior portion of the right rib which are most likely posttraumatic.   12/15/2020 Initial Diagnosis   Malignant neoplasm of prostate (HCC)   03/2021 -  Chemotherapy   Report unable to reach patient for PET scan that was arranged.   Initiated LT ADT and EBRT  completed in 08/2021   06/10/2022 Tumor Marker   PSA 7.16   07/14/2022 PET scan   PSMA PET 1. Several foci of intense radiotracer activity in the skeleton consistent with active skeletal metastasis. Metastatic lesions include the LEFT ischium, several spine lesions and multiple rib lesions. 2. On CT portion of exam there are innumerable small sclerotic lesions are which do not have radiotracer activity. 3. No focal activity in prostate gland. 4. No visceral metastasis.   10/18/2022 Tumor Marker   PSA 5.35   10/18/2022 Tumor Marker   PSA 5.35 Testosterone 13   02/13/2023 Tumor Marker   PSA 25 Testosterone 16.9   03/01/2023 Cancer Staging   Staging form: Prostate, AJCC 8th Edition - Pathologic stage from 03/01/2023: Stage IVB (pN0, pM1b, PSA: 25, Grade Group: 5) - Signed by Marcello Fennel, PA-C on 03/09/2023 Stage prefix: Recurrence Prostate specific antigen (PSA) range: 20 or greater Gleason primary pattern: 5 Gleason secondary pattern: 4 Gleason score: 9 Histologic grading system: 5 grade system   03/01/2023 PET scan   PSMA PET Previously there approximately 10 skeletal metastasis now there approximately 50 skeletal metastasis.   IMPRESSION: 1. Significant interval progression of skeletal metastasis. 2. Increase in size and number of radiotracer avid skeletal metastasis. 3. No evidence of visceral metastasis or nodal metastasis. 4. No evidence of local recurrence in the prostate gland.      PHYSICAL EXAMINATION: ECOG PERFORMANCE STATUS: 1 - Symptomatic but completely ambulatory  VSS. BP 116/76  No distress No lower extremity edema  Relevant data reviewed during this visit included lab

## 2023-08-22 ENCOUNTER — Other Ambulatory Visit: Payer: Self-pay

## 2023-08-22 ENCOUNTER — Other Ambulatory Visit (HOSPITAL_COMMUNITY): Payer: Self-pay

## 2023-08-22 LAB — TESTOSTERONE: Testosterone: 7 ng/dL — ABNORMAL LOW (ref 264–916)

## 2023-08-22 LAB — PROSTATE-SPECIFIC AG, SERUM (LABCORP): Prostate Specific Ag, Serum: 42.8 ng/mL — ABNORMAL HIGH (ref 0.0–4.0)

## 2023-08-23 LAB — ANTI-PARIETAL ANTIBODY: Parietal Cell Antibody-IgG: 2.3 U (ref 0.0–20.0)

## 2023-08-25 NOTE — Progress Notes (Signed)
 RN reviewed recommendations with patient for possibility of Zometa or Xgeva due to LT-ADT.  Patient is not currently under the care of a dentist.  Denies any dental issues at this time.  Patient is scheduled for follow up with Dr. Cherly Hensen on 4/28.    RN will review with MD next steps for dental clearance.

## 2023-09-01 NOTE — Progress Notes (Signed)
 Patient is now scheduled to be evaluated by a dentist prior to initiation of any BMA.    [facility] [phone] [address]  [place], [place] [address]  May 12th at 1:30 pm.    RN left message with patient requesting call back to update on appointment information.

## 2023-09-04 ENCOUNTER — Other Ambulatory Visit: Payer: Self-pay

## 2023-09-04 ENCOUNTER — Other Ambulatory Visit (HOSPITAL_COMMUNITY): Payer: Self-pay

## 2023-09-05 ENCOUNTER — Encounter: Payer: Self-pay | Admitting: Nurse Practitioner

## 2023-09-06 ENCOUNTER — Ambulatory Visit: Payer: Self-pay | Admitting: Nurse Practitioner

## 2023-09-06 ENCOUNTER — Encounter: Payer: Self-pay | Admitting: Nurse Practitioner

## 2023-09-06 ENCOUNTER — Other Ambulatory Visit: Payer: Self-pay

## 2023-09-06 ENCOUNTER — Other Ambulatory Visit (HOSPITAL_COMMUNITY): Payer: Self-pay

## 2023-09-06 VITALS — BP 131/88 | HR 83 | Temp 97.9°F | Wt 138.4 lb

## 2023-09-06 DIAGNOSIS — I2510 Atherosclerotic heart disease of native coronary artery without angina pectoris: Secondary | ICD-10-CM

## 2023-09-06 DIAGNOSIS — E119 Type 2 diabetes mellitus without complications: Secondary | ICD-10-CM

## 2023-09-06 DIAGNOSIS — J302 Other seasonal allergic rhinitis: Secondary | ICD-10-CM

## 2023-09-06 DIAGNOSIS — Z23 Encounter for immunization: Secondary | ICD-10-CM

## 2023-09-06 DIAGNOSIS — Z515 Encounter for palliative care: Secondary | ICD-10-CM

## 2023-09-06 DIAGNOSIS — C61 Malignant neoplasm of prostate: Secondary | ICD-10-CM

## 2023-09-06 DIAGNOSIS — Z13220 Encounter for screening for lipoid disorders: Secondary | ICD-10-CM

## 2023-09-06 LAB — POCT GLYCOSYLATED HEMOGLOBIN (HGB A1C): Hemoglobin A1C: 6.1 % — AB (ref 4.0–5.6)

## 2023-09-06 MED ORDER — TRAZODONE HCL 50 MG PO TABS
50.0000 mg | ORAL_TABLET | Freq: Every day | ORAL | 0 refills | Status: DC
  Filled 2023-09-06: qty 30, 30d supply, fill #0

## 2023-09-06 MED ORDER — AMLODIPINE BESYLATE 5 MG PO TABS
5.0000 mg | ORAL_TABLET | Freq: Every day | ORAL | 0 refills | Status: DC
  Filled 2023-09-06: qty 90, 90d supply, fill #0

## 2023-09-06 MED ORDER — BENZONATATE 100 MG PO CAPS: 100.0000 mg | ORAL_CAPSULE | Freq: Three times a day (TID) | ORAL | 0 refills | Status: DC | PRN

## 2023-09-06 MED ORDER — CETIRIZINE HCL 10 MG PO TABS: 10.0000 mg | ORAL_TABLET | Freq: Every day | ORAL | 11 refills | Status: DC

## 2023-09-06 MED ORDER — BENZONATATE 100 MG PO CAPS
100.0000 mg | ORAL_CAPSULE | Freq: Three times a day (TID) | ORAL | 0 refills | Status: DC | PRN
  Filled 2023-09-06: qty 30, 10d supply, fill #0

## 2023-09-06 MED ORDER — ATORVASTATIN CALCIUM 10 MG PO TABS
10.0000 mg | ORAL_TABLET | Freq: Every morning | ORAL | 0 refills | Status: DC
  Filled 2023-09-06: qty 30, 30d supply, fill #0

## 2023-09-06 MED ORDER — TRAZODONE HCL 50 MG PO TABS: 50.0000 mg | ORAL_TABLET | Freq: Every day | ORAL | 0 refills | Status: DC

## 2023-09-06 MED ORDER — AMLODIPINE BESYLATE 5 MG PO TABS: 5.0000 mg | ORAL_TABLET | Freq: Every day | ORAL | 0 refills | Status: DC

## 2023-09-06 MED ORDER — CETIRIZINE HCL 10 MG PO TABS
10.0000 mg | ORAL_TABLET | Freq: Every day | ORAL | 11 refills | Status: DC
  Filled 2023-09-06: qty 30, 30d supply, fill #0

## 2023-09-06 MED ORDER — CARVEDILOL 12.5 MG PO TABS
12.5000 mg | ORAL_TABLET | Freq: Two times a day (BID) | ORAL | 0 refills | Status: DC
  Filled 2023-09-06: qty 60, 30d supply, fill #0

## 2023-09-06 MED ORDER — CARVEDILOL 12.5 MG PO TABS: 12.5000 mg | ORAL_TABLET | Freq: Two times a day (BID) | ORAL | 0 refills | Status: DC

## 2023-09-06 MED ORDER — ATORVASTATIN CALCIUM 10 MG PO TABS: 10.0000 mg | ORAL_TABLET | Freq: Every morning | ORAL | 0 refills | Status: DC

## 2023-09-06 NOTE — Progress Notes (Signed)
 Specialty Pharmacy Refill Coordination Note  Troy Mclaughlin is a 68 y.o. male contacted today regarding refills of specialty medication(s) Darolutamide [name]   Patient requested Delivery   Delivery date: 09/18/23   Verified address: [address]   [place] [address]   Medication will be filled on 09/15/23.

## 2023-09-06 NOTE — Addendum Note (Signed)
 Addended by: Jerrlyn Morel on: 09/06/2023 10:27 AM   Modules accepted: Orders
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
## 2023-09-07 LAB — LIPID PANEL
Chol/HDL Ratio: 3 ratio (ref 0.0–5.0)
Cholesterol, Total: 167 mg/dL (ref 100–199)
HDL: 55 mg/dL (ref >39)
LDL Chol Calc (NIH): 93 mg/dL (ref 0–99)
Triglycerides: 103 mg/dL (ref 0–149)
VLDL Cholesterol Cal: 19 mg/dL (ref 5–40)

## 2023-09-07 LAB — MICROALBUMIN / CREATININE URINE RATIO
Creatinine, Urine: 202.1 mg/dL
Microalb/Creat Ratio: 7 mg/g{creat} (ref 0–29)
Microalbumin, Urine: 14.8 ug/mL

## 2023-09-07 LAB — CBC
Hematocrit: 29.5 % — ABNORMAL LOW (ref 37.5–51.0)
Hemoglobin: 9.3 g/dL — ABNORMAL LOW (ref 13.0–17.7)
MCH: 24.7 pg — ABNORMAL LOW (ref 26.6–33.0)
MCHC: 31.5 g/dL (ref 31.5–35.7)
MCV: 78 fL — ABNORMAL LOW (ref 79–97)
Platelets: 357 10*3/uL (ref 150–450)
RBC: 3.77 x10E6/uL — ABNORMAL LOW (ref 4.14–5.80)
RDW: 16.7 % — ABNORMAL HIGH (ref 11.6–15.4)
WBC: 3.5 10*3/uL (ref 3.4–10.8)

## 2023-09-07 LAB — COMPREHENSIVE METABOLIC PANEL WITH GFR
ALT: 7 IU/L (ref 0–44)
AST: 11 IU/L (ref 0–40)
Albumin: 3.7 g/dL — ABNORMAL LOW (ref 3.9–4.9)
Alkaline Phosphatase: 599 IU/L — ABNORMAL HIGH (ref 44–121)
BUN/Creatinine Ratio: 19 (ref 10–24)
BUN: 14 mg/dL (ref 8–27)
Bilirubin Total: <0.2 mg/dL (ref 0.0–1.2)
CO2: 21 mmol/L (ref 20–29)
Calcium: 8.8 mg/dL (ref 8.6–10.2)
Chloride: 105 mmol/L (ref 96–106)
Creatinine, Ser: 0.75 mg/dL — ABNORMAL LOW (ref 0.76–1.27)
Globulin, Total: 2.4 g/dL (ref 1.5–4.5)
Glucose: 94 mg/dL (ref 70–99)
Potassium: 4 mmol/L (ref 3.5–5.2)
Sodium: 141 mmol/L (ref 134–144)
Total Protein: 6.1 g/dL (ref 6.0–8.5)
eGFR: 100 mL/min/{1.73_m2} (ref >59)

## 2023-09-12 ENCOUNTER — Telehealth: Payer: Self-pay

## 2023-09-12 NOTE — Telephone Encounter (Signed)
 TC from pt, inquiring about the timing of his upcoming appointments due to not receiving the schedule that was mailed to him approx one week ago (mailed by this RN--oversight in documentation). Informed pt that it was mailed out, so he may still receive it. Reviewed all appts on 09/18/23, and pt verbalizes understanding.

## 2023-09-14 ENCOUNTER — Other Ambulatory Visit (HOSPITAL_COMMUNITY): Payer: Self-pay

## 2023-09-14 NOTE — Progress Notes (Signed)
 RN left a message for call back to review dental appointment information prior to any start of BMA.  Also, reminded patient to utilize the transportation contact number prior to appointment for ride set up.   RN will plan to meet with patient during upcoming appointment on 4/28 to give dental appointment information.

## 2023-09-15 ENCOUNTER — Other Ambulatory Visit: Payer: Self-pay

## 2023-09-15 NOTE — Assessment & Plan Note (Addendum)
 He has not obtained his Vit D/Cal. Recommend again to obtain OTC calcium  (1000-1200 mg daily from food and supplements) and vitamin D3 (1000 IU daily) Control HTN and HLD, heart healthy diet Routine dental care. Will see Dentist in May and then start zometa if no concerns. Weight-bearing exercises (30 minutes per day) Limit alcohol  consumption and avoid smoking

## 2023-09-15 NOTE — Assessment & Plan Note (Addendum)
 Amlodipine  10 mg daily. Refilled today. Coreg  6.25 mg twice daily. Refilled today.

## 2023-09-15 NOTE — Progress Notes (Signed)
 [facility] OFFICE PROGRESS NOTE  Patient Care Team: Jerrlyn Morel, NP as PCP - General (Pulmonary Disease) Katheleen Palmer, RN as Oncology Nurse Navigator Pickenpack-Cousar, Giles Labrum, NP as Nurse Practitioner (Hospice and Palliative Medicine) Carolyn Cisco, NP as Nurse Practitioner (Nurse Practitioner)  Troy Mclaughlin is a 68 y.o.male with history of HLD, HTN, DM2 being seen at [facility] for prostate cancer.   Current diagnosis: mCRPC Germline testing: negative  Somatic testing: no actionable mutation Previous Treatment: 03/2021 LT-ADT with EBRT. Completed radiation in 08/2021. Paliative radiation to L hip and L rib completed in 03/2023. Current treatment: darolutamide . Was first prescribed to start in Nov 2024 but did not. He is now reporting taking 600 mg twice daily since end of March. PSA and Alk phos decreased. Discussed continue 600 mg twice daily. He expresses understanding.  Recommend him again to start vit D and calcium . Refilled BP meds today. Assessment & Plan Malignant neoplasm of prostate (HCC) Was supposed to take darolutamide  at 600mg  twice daily but not. Recommend increase to 600 mg twice daily. ADT every 3 months. Last received Lupron  on 07/25/23 CBC, CMP, PSA, testosterone  monthly Other vitamin B12 deficiency anemia Neg IF and AP antibodies B12 1000 mcg daily At risk for side effect of medication He has not obtained his Vit D/Cal. Recommend again to obtain OTC calcium  (1000-1200 mg daily from food and supplements) and vitamin D3 (1000 IU daily) Control HTN and HLD, heart healthy diet Routine dental care. Will see Dentist in May and then start zometa if no concerns. Weight-bearing exercises (30 minutes per day) Limit alcohol  consumption and avoid smoking Essential hypertension Amlodipine  10 mg daily. Refilled today. Coreg  6.25 mg twice daily. Refilled today. Coronary artery disease involving native coronary artery of native heart without  angina pectoris On Coreg  Continue CV risk management Subacute cough Patient requests tessalon  refill. Refilled today. Denies fever, or short of breath. Microcytic anemia Worsening. Denies bleeding, melena.  Orders Placed This Encounter  Procedures   CBC with Differential (Cancer Center Only)    Standing Status:   Future    Expiration Date:   09/17/2024   CMP (Cancer Center only)    Standing Status:   Future    Expiration Date:   09/17/2024   Testosterone     Standing Status:   Future    Expiration Date:   09/17/2024   Prostate-Specific AG, Serum    Standing Status:   Future    Expiration Date:   09/17/2024     Lowanda Ruddy, MD  INTERVAL HISTORY: [name] returns for treatment follow-up. Report of right shoulder pain.  Report he ran out of antihypertesnives. Also needs tassalon. No trouble urinating. Right knee joint pain. No weakness.   He has been taking daro as prescribed. He gets call about it monthly. No issue with supplies.  Oncology History  Malignant neoplasm of prostate (HCC)  04/11/2020 Tumor Marker   PSA 115   09/03/2020 Tumor Marker   PSA 113   10/27/2020 Cancer Staging   Staging form: Prostate, AJCC 8th Edition - Clinical stage from 10/27/2020: Stage IIIC (cT2a, cN0, cM0, PSA: 113, Grade Group: 5) - Signed by Keitha Pata, PA-C on 12/15/2020 Histopathologic type: Adenocarcinoma, NOS Stage prefix: Initial diagnosis Prostate specific antigen (PSA) range: 20 or greater Gleason primary pattern: 5 Gleason secondary pattern: 4 Gleason score: 9 Histologic grading system: 5 grade system Number of biopsy cores examined: 12 Number of biopsy cores positive: 12 Location of positive needle core biopsies:  Both sides   10/27/2020 Imaging   From outside report CT AP reported no metastatic disease.   11/17/2020 Imaging   Bone scan Focal abnormal uptake in the posterior portion of the right side of the upper cervical spine most consistent with degenerative changes and 2 foci  of abnormal uptake in the posterior portion of the right rib which are most likely posttraumatic.   12/15/2020 Initial Diagnosis   Malignant neoplasm of prostate (HCC)   03/2021 -  Chemotherapy   Report unable to reach patient for PET scan that was arranged.   Initiated LT ADT and EBRT completed in 08/2021   06/10/2022 Tumor Marker   PSA 7.16   07/14/2022 PET scan   PSMA PET 1. Several foci of intense radiotracer activity in the skeleton consistent with active skeletal metastasis. Metastatic lesions include the LEFT ischium, several spine lesions and multiple rib lesions. 2. On CT portion of exam there are innumerable small sclerotic lesions are which do not have radiotracer activity. 3. No focal activity in prostate gland. 4. No visceral metastasis.   10/18/2022 Tumor Marker   PSA 5.35   10/18/2022 Tumor Marker   PSA 5.35 Testosterone  13   02/13/2023 Tumor Marker   PSA 25 Testosterone  16.9   03/01/2023 Cancer Staging   Staging form: Prostate, AJCC 8th Edition - Pathologic stage from 03/01/2023: Stage IVB (pN0, pM1b, PSA: 25, Grade Group: 5) - Signed by Keitha Pata, PA-C on 03/09/2023 Stage prefix: Recurrence Prostate specific antigen (PSA) range: 20 or greater Gleason primary pattern: 5 Gleason secondary pattern: 4 Gleason score: 9 Histologic grading system: 5 grade system   03/01/2023 PET scan   PSMA PET Previously there approximately 10 skeletal metastasis now there approximately 50 skeletal metastasis.   IMPRESSION: 1. Significant interval progression of skeletal metastasis. 2. Increase in size and number of radiotracer avid skeletal metastasis. 3. No evidence of visceral metastasis or nodal metastasis. 4. No evidence of local recurrence in the prostate gland.      PHYSICAL EXAMINATION: ECOG PERFORMANCE STATUS: 1 - Symptomatic but completely ambulatory  Vitals:   09/18/23 1241  BP: (!) 147/78  Pulse: 66  Resp: 17  Temp: 97.9 F (36.6 C)   Filed  Weights   09/18/23 1241  Weight: 138 lb 1.6 oz (62.6 kg)   No distress.  Lower extremity strength and sensation equal bilaterally.  Relevant data reviewed during this visit included labs.

## 2023-09-15 NOTE — Assessment & Plan Note (Addendum)
 Was supposed to take darolutamide  at 600mg  twice daily but not. Recommend increase to 600 mg twice daily. ADT every 3 months. Last received Lupron  on 07/25/23 CBC, CMP, PSA, testosterone  monthly

## 2023-09-15 NOTE — Assessment & Plan Note (Addendum)
 Neg IF and AP antibodies B12 1000 mcg daily

## 2023-09-15 NOTE — Assessment & Plan Note (Addendum)
 On Coreg  Continue CV risk management

## 2023-09-18 ENCOUNTER — Inpatient Hospital Stay

## 2023-09-18 ENCOUNTER — Inpatient Hospital Stay (HOSPITAL_BASED_OUTPATIENT_CLINIC_OR_DEPARTMENT_OTHER): Admitting: Nurse Practitioner

## 2023-09-18 ENCOUNTER — Other Ambulatory Visit (HOSPITAL_COMMUNITY): Payer: Self-pay

## 2023-09-18 ENCOUNTER — Other Ambulatory Visit: Payer: Self-pay | Admitting: *Deleted

## 2023-09-18 ENCOUNTER — Inpatient Hospital Stay: Attending: Radiation Oncology

## 2023-09-18 ENCOUNTER — Inpatient Hospital Stay (HOSPITAL_BASED_OUTPATIENT_CLINIC_OR_DEPARTMENT_OTHER)

## 2023-09-18 ENCOUNTER — Other Ambulatory Visit: Payer: Self-pay

## 2023-09-18 ENCOUNTER — Encounter: Payer: Self-pay | Admitting: Nurse Practitioner

## 2023-09-18 VITALS — BP 147/78 | HR 66 | Temp 97.9°F | Resp 17 | Ht 69.0 in | Wt 138.1 lb

## 2023-09-18 DIAGNOSIS — I10 Essential (primary) hypertension: Secondary | ICD-10-CM

## 2023-09-18 DIAGNOSIS — Z923 Personal history of irradiation: Secondary | ICD-10-CM | POA: Diagnosis not present

## 2023-09-18 DIAGNOSIS — Z9189 Other specified personal risk factors, not elsewhere classified: Secondary | ICD-10-CM

## 2023-09-18 DIAGNOSIS — C61 Malignant neoplasm of prostate: Secondary | ICD-10-CM | POA: Diagnosis not present

## 2023-09-18 DIAGNOSIS — F17200 Nicotine dependence, unspecified, uncomplicated: Secondary | ICD-10-CM | POA: Diagnosis not present

## 2023-09-18 DIAGNOSIS — Z9221 Personal history of antineoplastic chemotherapy: Secondary | ICD-10-CM | POA: Insufficient documentation

## 2023-09-18 DIAGNOSIS — D518 Other vitamin B12 deficiency anemias: Secondary | ICD-10-CM | POA: Diagnosis not present

## 2023-09-18 DIAGNOSIS — I2510 Atherosclerotic heart disease of native coronary artery without angina pectoris: Secondary | ICD-10-CM

## 2023-09-18 DIAGNOSIS — R630 Anorexia: Secondary | ICD-10-CM

## 2023-09-18 DIAGNOSIS — D509 Iron deficiency anemia, unspecified: Secondary | ICD-10-CM | POA: Diagnosis not present

## 2023-09-18 DIAGNOSIS — Z515 Encounter for palliative care: Secondary | ICD-10-CM | POA: Diagnosis not present

## 2023-09-18 DIAGNOSIS — R052 Subacute cough: Secondary | ICD-10-CM | POA: Diagnosis not present

## 2023-09-18 DIAGNOSIS — G893 Neoplasm related pain (acute) (chronic): Secondary | ICD-10-CM

## 2023-09-18 DIAGNOSIS — C7951 Secondary malignant neoplasm of bone: Secondary | ICD-10-CM | POA: Diagnosis not present

## 2023-09-18 DIAGNOSIS — Z79899 Other long term (current) drug therapy: Secondary | ICD-10-CM | POA: Insufficient documentation

## 2023-09-18 DIAGNOSIS — D519 Vitamin B12 deficiency anemia, unspecified: Secondary | ICD-10-CM | POA: Insufficient documentation

## 2023-09-18 DIAGNOSIS — K5903 Drug induced constipation: Secondary | ICD-10-CM

## 2023-09-18 LAB — CBC WITH DIFFERENTIAL (CANCER CENTER ONLY)
Abs Immature Granulocytes: 0.01 10*3/uL (ref 0.00–0.07)
Basophils Absolute: 0 10*3/uL (ref 0.0–0.1)
Basophils Relative: 0 %
Eosinophils Absolute: 0.2 10*3/uL (ref 0.0–0.5)
Eosinophils Relative: 6 %
HCT: 25.1 % — ABNORMAL LOW (ref 39.0–52.0)
Hemoglobin: 8.6 g/dL — ABNORMAL LOW (ref 13.0–17.0)
Immature Granulocytes: 0 %
Lymphocytes Relative: 15 %
Lymphs Abs: 0.6 10*3/uL — ABNORMAL LOW (ref 0.7–4.0)
MCH: 24.6 pg — ABNORMAL LOW (ref 26.0–34.0)
MCHC: 34.3 g/dL (ref 30.0–36.0)
MCV: 71.9 fL — ABNORMAL LOW (ref 80.0–100.0)
Monocytes Absolute: 0.4 10*3/uL (ref 0.1–1.0)
Monocytes Relative: 11 %
Neutro Abs: 2.6 10*3/uL (ref 1.7–7.7)
Neutrophils Relative %: 68 %
Platelet Count: 344 10*3/uL (ref 150–400)
RBC: 3.49 MIL/uL — ABNORMAL LOW (ref 4.22–5.81)
RDW: 16.9 % — ABNORMAL HIGH (ref 11.5–15.5)
WBC Count: 3.8 10*3/uL — ABNORMAL LOW (ref 4.0–10.5)
nRBC: 0 % (ref 0.0–0.2)

## 2023-09-18 LAB — CMP (CANCER CENTER ONLY)
ALT: <5 U/L (ref 0–44)
AST: 8 U/L — ABNORMAL LOW (ref 15–41)
Albumin: 3.6 g/dL (ref 3.5–5.0)
Alkaline Phosphatase: 418 U/L — ABNORMAL HIGH (ref 38–126)
Anion gap: 6 (ref 5–15)
BUN: 9 mg/dL (ref 8–23)
CO2: 27 mmol/L (ref 22–32)
Calcium: 8.7 mg/dL — ABNORMAL LOW (ref 8.9–10.3)
Chloride: 103 mmol/L (ref 98–111)
Creatinine: 0.7 mg/dL (ref 0.61–1.24)
GFR, Estimated: >60 mL/min (ref >60)
Glucose, Bld: 101 mg/dL — ABNORMAL HIGH (ref 70–99)
Potassium: 3.5 mmol/L (ref 3.5–5.1)
Sodium: 136 mmol/L (ref 135–145)
Total Bilirubin: 0.3 mg/dL (ref 0.0–1.2)
Total Protein: 6.6 g/dL (ref 6.5–8.1)

## 2023-09-18 LAB — FOLATE: Folate: 7 ng/mL (ref >5.9)

## 2023-09-18 LAB — FERRITIN: Ferritin: 455 ng/mL — ABNORMAL HIGH (ref 24–336)

## 2023-09-18 MED ORDER — BENZONATATE 100 MG PO CAPS
100.0000 mg | ORAL_CAPSULE | Freq: Three times a day (TID) | ORAL | 0 refills | Status: DC | PRN
  Filled 2023-09-18 (×2): qty 30, 10d supply, fill #0

## 2023-09-18 MED ORDER — CARVEDILOL 12.5 MG PO TABS
12.5000 mg | ORAL_TABLET | Freq: Two times a day (BID) | ORAL | 1 refills | Status: DC
  Filled 2023-09-18: qty 180, 90d supply, fill #0

## 2023-09-18 MED ORDER — OXYCODONE HCL 10 MG PO TABS
10.0000 mg | ORAL_TABLET | ORAL | 0 refills | Status: DC | PRN
Start: 2023-09-18 — End: 2023-10-13
  Filled 2023-09-18 (×2): qty 90, 15d supply, fill #0

## 2023-09-18 MED ORDER — AMLODIPINE BESYLATE 10 MG PO TABS
10.0000 mg | ORAL_TABLET | Freq: Every morning | ORAL | 1 refills | Status: DC
Start: 2023-09-18 — End: 2023-10-25
  Filled 2023-09-18: qty 90, 90d supply, fill #0

## 2023-09-18 MED ORDER — NICOTINE 14 MG/24HR TD PT24
14.0000 mg | MEDICATED_PATCH | Freq: Every day | TRANSDERMAL | 0 refills | Status: DC
  Filled 2023-09-18 (×2): qty 28, 28d supply, fill #0

## 2023-09-18 MED ORDER — XTAMPZA ER 9 MG PO C12A
9.0000 mg | EXTENDED_RELEASE_CAPSULE | Freq: Two times a day (BID) | ORAL | 0 refills | Status: DC
Start: 2023-09-18 — End: 2023-10-19
  Filled 2023-09-18 (×2): qty 60, 30d supply, fill #0

## 2023-09-18 NOTE — Progress Notes (Signed)
 RN met with patient prior to MD follow up visit.  Patient was given a copy of dental appointment information and address.  Patient verbalized concerns with transportation to appointment due to it not being within the [facility].  RN will reach out to our LCSW to see if any resources are available for assistance with this.

## 2023-09-18 NOTE — Assessment & Plan Note (Addendum)
 Worsening. Denies bleeding, melena.

## 2023-09-19 ENCOUNTER — Other Ambulatory Visit (HOSPITAL_COMMUNITY): Payer: Self-pay

## 2023-09-19 ENCOUNTER — Ambulatory Visit: Admitting: Podiatry

## 2023-09-19 LAB — TESTOSTERONE: Testosterone: <3 ng/dL — ABNORMAL LOW (ref 264–916)

## 2023-09-19 LAB — PROSTATE-SPECIFIC AG, SERUM (LABCORP): Prostate Specific Ag, Serum: 25.6 ng/mL — ABNORMAL HIGH (ref 0.0–4.0)
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
## 2023-09-25 ENCOUNTER — Other Ambulatory Visit (HOSPITAL_COMMUNITY): Payer: Self-pay

## 2023-09-26 ENCOUNTER — Other Ambulatory Visit: Payer: Self-pay

## 2023-09-26 DIAGNOSIS — C61 Malignant neoplasm of prostate: Secondary | ICD-10-CM

## 2023-09-29 ENCOUNTER — Other Ambulatory Visit (HOSPITAL_COMMUNITY): Payer: Self-pay

## 2023-10-02 NOTE — Progress Notes (Addendum)
 RN received notification on 5/9 that we can provide transportation for patient's upcoming dental appointment due to being part of his care for his metastatic prostate cancer.   RN spoke with patient and he cancelled original appointment, however, called today to reschedule to 5/19 at 11 am.  I will notify our transportation coordinator.

## 2023-10-05 ENCOUNTER — Other Ambulatory Visit: Payer: Self-pay

## 2023-10-10 ENCOUNTER — Other Ambulatory Visit (HOSPITAL_COMMUNITY): Payer: Self-pay

## 2023-10-10 NOTE — Progress Notes (Signed)
 RN reached out to [facility] to follow up after recent appointment 5/19.  Patient was unable to be seen due to not having insurance card at time of visit.    RN contact patient to follow up for next steps, rescheduling.  Per patient's wife, he is unavailable.  RN will reach out again at a later time.

## 2023-10-11 ENCOUNTER — Other Ambulatory Visit: Payer: Self-pay

## 2023-10-12 ENCOUNTER — Other Ambulatory Visit (HOSPITAL_COMMUNITY): Payer: Self-pay

## 2023-10-12 ENCOUNTER — Other Ambulatory Visit: Payer: Self-pay

## 2023-10-12 NOTE — Progress Notes (Signed)
 Specialty Pharmacy Refill Coordination Note  Troy Mclaughlin is a 68 y.o. male contacted today regarding refills of specialty medication(s) Darolutamide  (NUBEQA )   Patient requested Delivery   Delivery date: 10/25/23   Verified address: [address]   [place] [address]   Medication will be filled on 10/23/21.

## 2023-10-12 NOTE — Progress Notes (Signed)
 Specialty Pharmacy Ongoing Clinical Assessment Note  Troy Mclaughlin is a 68 y.o. male who is being followed by the specialty pharmacy service for RxSp Oncology   Patient's specialty medication(s) reviewed today: Darolutamide  (NUBEQA )   Missed doses in the last 4 weeks: 0   Patient/Caregiver did not have any additional questions or concerns.   Therapeutic benefit summary: Patient is achieving benefit   Adverse events/side effects summary: No adverse events/side effects   Patient's therapy is appropriate to: Continue    Goals Addressed             This Visit's Progress    Maintain optimal adherence to therapy   On track    Patient is on track. Patient will maintain adherence         Follow up: 3 months  Malachi Screws Specialty Pharmacist

## 2023-10-13 ENCOUNTER — Other Ambulatory Visit (HOSPITAL_COMMUNITY): Payer: Self-pay

## 2023-10-13 ENCOUNTER — Telehealth: Payer: Self-pay

## 2023-10-13 ENCOUNTER — Other Ambulatory Visit: Payer: Self-pay | Admitting: Nurse Practitioner

## 2023-10-13 DIAGNOSIS — Z515 Encounter for palliative care: Secondary | ICD-10-CM

## 2023-10-13 DIAGNOSIS — C61 Malignant neoplasm of prostate: Secondary | ICD-10-CM

## 2023-10-13 DIAGNOSIS — G893 Neoplasm related pain (acute) (chronic): Secondary | ICD-10-CM

## 2023-10-13 MED ORDER — OXYCODONE HCL 10 MG PO TABS
10.0000 mg | ORAL_TABLET | ORAL | 0 refills | Status: DC | PRN
  Filled 2023-10-13: qty 90, 15d supply, fill #0

## 2023-10-13 NOTE — Telephone Encounter (Signed)
 Received call from oncology RN reporting pt c/o increased pain. Pt was not using his long acting pain med and instead was taking 2, 10mg  oxycodone  every 4 hours. Pt was educated on how to correctly take pain meds and how they work. Pt verbalized understanding. Script refill sent in.

## 2023-10-13 NOTE — Telephone Encounter (Signed)
 TC from pt, reporting an increase in generalized pain despite taking more pain medication. He is supposed to be taking Oxycodone  10 mg 1 tab q 4 hours, but he has started taking 2 tabs (now has 10 pills remaining). He states he took his first Oxycodone  ER today, but it has only helped "some." He would like better pain control. Informed him that Dr. Alita Irwin and Landa Pine, NP would be notified of his request.

## 2023-10-17 NOTE — Assessment & Plan Note (Deleted)
 Amlodipine  10 mg daily. Refilled today. Coreg  6.25 mg twice daily. Refilled today.

## 2023-10-17 NOTE — Assessment & Plan Note (Deleted)
 He has not obtained his Vit D/Cal. Recommend again to obtain OTC calcium  (1000-1200 mg daily from food and supplements) and vitamin D3 (1000 IU daily) Control HTN and HLD, heart healthy diet Routine dental care. Will see Dentist in May and then start zometa if no concerns. Weight-bearing exercises (30 minutes per day) Limit alcohol  consumption and avoid smoking

## 2023-10-17 NOTE — Assessment & Plan Note (Deleted)
 Neg IF and AP antibodies B12 1000 mcg daily

## 2023-10-17 NOTE — Assessment & Plan Note (Deleted)
 On Lipitor Monitor for lipid panel and SEs

## 2023-10-17 NOTE — Progress Notes (Deleted)
 [facility] OFFICE PROGRESS NOTE  Patient Care Team: Jerrlyn Morel, NP as PCP - General (Pulmonary Disease) Katheleen Palmer, RN as Oncology Nurse Navigator Pickenpack-Cousar, Giles Labrum, NP as Nurse Practitioner (Hospice and Palliative Medicine) Carolyn Cisco, NP as Nurse Practitioner (Nurse Practitioner)  Troy Mclaughlin is a 68 y.o.male with history of HLD, HTN, DM2 [facility].   Current diagnosis: mCRPC Germline testing: negative  Somatic testing: no actionable mutation Previous Treatment: 03/2021 LT-ADT with EBRT. Completed radiation in 08/2021. Paliative radiation to L hip and L rib completed in 03/2023. Current treatment: darolutamide . Was first prescribed to start in Nov 2024 but did not. He is now reporting taking 600 mg twice daily since end of March. PSA and Alk phos decreased. Discussed continue 600 mg twice daily. He expresses understanding.   Assessment & Plan Malignant neoplasm of prostate (HCC) Recommend darolutamide  600 mg twice daily. ADT every 3 months. Last received Lupron  on 07/25/23 CBC, CMP, PSA, testosterone  monthly  No orders of the defined types were placed in this encounter.    Lowanda Ruddy, MD  INTERVAL HISTORY: Patient returns for follow-up.  Oncology History  Malignant neoplasm of prostate (HCC)  04/11/2020 Tumor Marker   PSA 115   09/03/2020 Tumor Marker   PSA 113   10/27/2020 Cancer Staging   Staging form: Prostate, AJCC 8th Edition - Clinical stage from 10/27/2020: Stage IIIC (cT2a, cN0, cM0, PSA: 113, Grade Group: 5) - Signed by Keitha Pata, PA-C on 12/15/2020 Histopathologic type: Adenocarcinoma, NOS Stage prefix: Initial diagnosis Prostate specific antigen (PSA) range: 20 or greater Gleason primary pattern: 5 Gleason secondary pattern: 4 Gleason score: 9 Histologic grading system: 5 grade system Number of biopsy cores examined: 12 Number of biopsy cores positive: 12 Location of  positive needle core biopsies: Both sides   10/27/2020 Imaging   From outside report CT AP reported no metastatic disease.   11/17/2020 Imaging   Bone scan Focal abnormal uptake in the posterior portion of the right side of the upper cervical spine most consistent with degenerative changes and 2 foci of abnormal uptake in the posterior portion of the right rib which are most likely posttraumatic.   12/15/2020 Initial Diagnosis   Malignant neoplasm of prostate (HCC)   03/2021 -  Chemotherapy   Report unable to reach patient for PET scan that was arranged.   Initiated LT ADT and EBRT completed in 08/2021   06/10/2022 Tumor Marker   PSA 7.16   07/14/2022 PET scan   PSMA PET 1. Several foci of intense radiotracer activity in the skeleton consistent with active skeletal metastasis. Metastatic lesions include the LEFT ischium, several spine lesions and multiple rib lesions. 2. On CT portion of exam there are innumerable small sclerotic lesions are which do not have radiotracer activity. 3. No focal activity in prostate gland. 4. No visceral metastasis.   10/18/2022 Tumor Marker   PSA 5.35   10/18/2022 Tumor Marker   PSA 5.35 Testosterone  13   02/13/2023 Tumor Marker   PSA 25 Testosterone  16.9   03/01/2023 Cancer Staging   Staging form: Prostate, AJCC 8th Edition - Pathologic stage from 03/01/2023: Stage IVB (pN0, pM1b, PSA: 25, Grade Group: 5) - Signed by Keitha Pata, PA-C on 03/09/2023 Stage prefix: Recurrence Prostate specific antigen (PSA) range: 20 or greater Gleason primary pattern: 5 Gleason secondary pattern: 4 Gleason score: 9 Histologic grading system: 5 grade system   03/01/2023 PET scan   PSMA PET  Previously there approximately 10 skeletal metastasis now there approximately 50 skeletal metastasis.   IMPRESSION: 1. Significant interval progression of skeletal metastasis. 2. Increase in size and number of radiotracer avid skeletal metastasis. 3. No evidence of  visceral metastasis or nodal metastasis. 4. No evidence of local recurrence in the prostate gland.      PHYSICAL EXAMINATION: ECOG PERFORMANCE STATUS: {CHL ONC ECOG PS:[id number]}  There were no vitals filed for this visit. There were no vitals filed for this visit.  GENERAL: alert, no distress and comfortable SKIN: skin color normal and no bruising or petechiae or jaundice on exposed skin EYES: normal, sclera clear OROPHARYNX: no exudate  NECK: No palpable mass LYMPH:  no palpable cervical, axillary lymphadenopathy  LUNGS: clear to auscultation and percussion with normal breathing effort HEART: regular rate & rhythm  ABDOMEN: abdomen soft, non-tender and nondistended. Musculoskeletal: no edema NEURO: no focal motor/sensory deficits  Relevant data reviewed during this visit included ***

## 2023-10-17 NOTE — Assessment & Plan Note (Deleted)
 Recommend darolutamide  600 mg twice daily. ADT every 3 months. Last received Lupron  on 07/25/23 CBC, CMP, PSA, testosterone  monthly

## 2023-10-19 ENCOUNTER — Other Ambulatory Visit: Payer: Self-pay

## 2023-10-19 ENCOUNTER — Inpatient Hospital Stay

## 2023-10-19 ENCOUNTER — Inpatient Hospital Stay: Admitting: Nurse Practitioner

## 2023-10-19 ENCOUNTER — Other Ambulatory Visit (HOSPITAL_COMMUNITY): Payer: Self-pay

## 2023-10-19 ENCOUNTER — Telehealth: Payer: Self-pay

## 2023-10-19 ENCOUNTER — Inpatient Hospital Stay: Attending: Radiation Oncology

## 2023-10-19 DIAGNOSIS — C61 Malignant neoplasm of prostate: Secondary | ICD-10-CM

## 2023-10-19 DIAGNOSIS — Z515 Encounter for palliative care: Secondary | ICD-10-CM

## 2023-10-19 DIAGNOSIS — E7849 Other hyperlipidemia: Secondary | ICD-10-CM

## 2023-10-19 DIAGNOSIS — Z9189 Other specified personal risk factors, not elsewhere classified: Secondary | ICD-10-CM

## 2023-10-19 DIAGNOSIS — G893 Neoplasm related pain (acute) (chronic): Secondary | ICD-10-CM

## 2023-10-19 DIAGNOSIS — I10 Essential (primary) hypertension: Secondary | ICD-10-CM

## 2023-10-19 DIAGNOSIS — D518 Other vitamin B12 deficiency anemias: Secondary | ICD-10-CM

## 2023-10-19 MED ORDER — XTAMPZA ER 9 MG PO C12A
9.0000 mg | EXTENDED_RELEASE_CAPSULE | Freq: Two times a day (BID) | ORAL | 0 refills | Status: DC
  Filled 2023-10-19: qty 60, 30d supply, fill #0

## 2023-10-19 NOTE — Telephone Encounter (Signed)
 Approx 2:00 Pt calls to report that transportation service has dropped him off at [facility] instead of bringing him to the [facility] for his appointments. After many conversations w/ schedulers Gertie Kub and Woodroe Hazel, pt is still waiting on a ride to take him home (since he has missed all his appointments today). Per Woodroe Hazel, a ride is supposed to pick him up at [facility] on [facility] within 5 minutes (pt walked to this location). Pt aware.

## 2023-10-20 ENCOUNTER — Other Ambulatory Visit: Payer: Self-pay

## 2023-10-20 ENCOUNTER — Other Ambulatory Visit (HOSPITAL_COMMUNITY): Payer: Self-pay

## 2023-10-23 NOTE — Progress Notes (Signed)
 Patient is now rescheduled at [facility] for 6/11 at 12:30 pm.   RN placed request for referral coordinator to assist with transportation. Patient is aware of appointment time and date.  Patient has confirmed he has appropriate insurance card and will provide at dental appointment.

## 2023-10-24 NOTE — Progress Notes (Unsigned)
 [facility] OFFICE PROGRESS NOTE  Patient Care Team: Jerrlyn Morel, NP as PCP - General (Pulmonary Disease) Katheleen Palmer, RN as Oncology Nurse Navigator Pickenpack-Cousar, Giles Labrum, NP as Nurse Practitioner (Hospice and Palliative Medicine) Carolyn Cisco, NP as Nurse Practitioner (Nurse Practitioner)  Carlin is a 68 y.o.male with history of HLD, HTN, DM2 [facility].   Current diagnosis: mCRPC with bone metastases Germline testing: negative  Somatic testing: no actionable mutation Previous Treatment: 03/2021 LT-ADT with EBRT. Completed radiation in 08/2021. Paliative radiation to L hip and L rib completed in 03/2023. Current treatment: darolutamide . Was first prescribed to start in Nov 2024 but did not. He is now reporting taking 600 mg twice daily since end of March. PSA and Alk phos decreased. Discussed continue 600 mg twice daily. He expresses understanding.  Last PSA was trending down. Assessment & Plan   No orders of the defined types were placed in this encounter.    Lowanda Ruddy, MD  INTERVAL HISTORY: Patient returns for follow-up.  Oncology History  Malignant neoplasm of prostate (HCC)  04/11/2020 Tumor Marker   PSA 115   09/03/2020 Tumor Marker   PSA 113   10/27/2020 Cancer Staging   Staging form: Prostate, AJCC 8th Edition - Clinical stage from 10/27/2020: Stage IIIC (cT2a, cN0, cM0, PSA: 113, Grade Group: 5) - Signed by Keitha Pata, PA-C on 12/15/2020 Histopathologic type: Adenocarcinoma, NOS Stage prefix: Initial diagnosis Prostate specific antigen (PSA) range: 20 or greater Gleason primary pattern: 5 Gleason secondary pattern: 4 Gleason score: 9 Histologic grading system: 5 grade system Number of biopsy cores examined: 12 Number of biopsy cores positive: 12 Location of positive needle core biopsies: Both sides   10/27/2020 Imaging   From outside report CT AP reported no metastatic  disease.   11/17/2020 Imaging   Bone scan Focal abnormal uptake in the posterior portion of the right side of the upper cervical spine most consistent with degenerative changes and 2 foci of abnormal uptake in the posterior portion of the right rib which are most likely posttraumatic.   12/15/2020 Initial Diagnosis   Malignant neoplasm of prostate (HCC)   03/2021 -  Chemotherapy   Report unable to reach patient for PET scan that was arranged.   Initiated LT ADT and EBRT completed in 08/2021   06/10/2022 Tumor Marker   PSA 7.16   07/14/2022 PET scan   PSMA PET 1. Several foci of intense radiotracer activity in the skeleton consistent with active skeletal metastasis. Metastatic lesions include the LEFT ischium, several spine lesions and multiple rib lesions. 2. On CT portion of exam there are innumerable small sclerotic lesions are which do not have radiotracer activity. 3. No focal activity in prostate gland. 4. No visceral metastasis.   10/18/2022 Tumor Marker   PSA 5.35   10/18/2022 Tumor Marker   PSA 5.35 Testosterone  13   02/13/2023 Tumor Marker   PSA 25 Testosterone  16.9   03/01/2023 Cancer Staging   Staging form: Prostate, AJCC 8th Edition - Pathologic stage from 03/01/2023: Stage IVB (pN0, pM1b, PSA: 25, Grade Group: 5) - Signed by Keitha Pata, PA-C on 03/09/2023 Stage prefix: Recurrence Prostate specific antigen (PSA) range: 20 or greater Gleason primary pattern: 5 Gleason secondary pattern: 4 Gleason score: 9 Histologic grading system: 5 grade system   03/01/2023 PET scan   PSMA PET Previously there approximately 10 skeletal metastasis now there approximately 50 skeletal metastasis.   IMPRESSION: 1. Significant  interval progression of skeletal metastasis. 2. Increase in size and number of radiotracer avid skeletal metastasis. 3. No evidence of visceral metastasis or nodal metastasis. 4. No evidence of local recurrence in the prostate gland.       PHYSICAL EXAMINATION: ECOG PERFORMANCE STATUS: {CHL ONC ECOG [id number]}  There were no vitals filed for this visit. There were no vitals filed for this visit.  GENERAL: alert, no distress and comfortable SKIN: skin color normal and no bruising or petechiae or jaundice on exposed skin EYES: normal, sclera clear OROPHARYNX: no exudate  NECK: No palpable mass LYMPH:  no palpable cervical, axillary lymphadenopathy  LUNGS: clear to auscultation and percussion with normal breathing effort HEART: regular rate & rhythm  ABDOMEN: abdomen soft, non-tender and nondistended. Musculoskeletal: no edema NEURO: no focal motor/sensory deficits  Relevant data reviewed during this visit included ***

## 2023-10-24 NOTE — Assessment & Plan Note (Signed)
 Recommend continue darolutamide  600 mg twice daily. ADT every 3 months. Last received Lupron  on 07/25/23. Patient missed last appt and will be rescheduled. CBC, CMP, PSA, testosterone  in July, coordinate with palliative care.

## 2023-10-25 ENCOUNTER — Inpatient Hospital Stay: Admitting: Nurse Practitioner

## 2023-10-25 ENCOUNTER — Other Ambulatory Visit: Payer: Self-pay

## 2023-10-25 ENCOUNTER — Inpatient Hospital Stay (HOSPITAL_BASED_OUTPATIENT_CLINIC_OR_DEPARTMENT_OTHER)

## 2023-10-25 ENCOUNTER — Inpatient Hospital Stay

## 2023-10-25 ENCOUNTER — Encounter: Payer: Self-pay | Admitting: Nurse Practitioner

## 2023-10-25 ENCOUNTER — Other Ambulatory Visit (HOSPITAL_COMMUNITY): Payer: Self-pay

## 2023-10-25 ENCOUNTER — Inpatient Hospital Stay: Attending: Radiation Oncology

## 2023-10-25 VITALS — BP 180/98 | HR 73 | Temp 97.8°F | Resp 18 | Wt 132.2 lb

## 2023-10-25 DIAGNOSIS — E7849 Other hyperlipidemia: Secondary | ICD-10-CM | POA: Insufficient documentation

## 2023-10-25 DIAGNOSIS — D519 Vitamin B12 deficiency anemia, unspecified: Secondary | ICD-10-CM | POA: Diagnosis not present

## 2023-10-25 DIAGNOSIS — R110 Nausea: Secondary | ICD-10-CM | POA: Diagnosis not present

## 2023-10-25 DIAGNOSIS — I10 Essential (primary) hypertension: Secondary | ICD-10-CM | POA: Diagnosis not present

## 2023-10-25 DIAGNOSIS — D518 Other vitamin B12 deficiency anemias: Secondary | ICD-10-CM | POA: Diagnosis not present

## 2023-10-25 DIAGNOSIS — C61 Malignant neoplasm of prostate: Secondary | ICD-10-CM

## 2023-10-25 DIAGNOSIS — F17290 Nicotine dependence, other tobacco product, uncomplicated: Secondary | ICD-10-CM | POA: Diagnosis not present

## 2023-10-25 DIAGNOSIS — Z79899 Other long term (current) drug therapy: Secondary | ICD-10-CM | POA: Diagnosis not present

## 2023-10-25 DIAGNOSIS — R112 Nausea with vomiting, unspecified: Secondary | ICD-10-CM | POA: Insufficient documentation

## 2023-10-25 DIAGNOSIS — R630 Anorexia: Secondary | ICD-10-CM

## 2023-10-25 DIAGNOSIS — J302 Other seasonal allergic rhinitis: Secondary | ICD-10-CM

## 2023-10-25 DIAGNOSIS — I2510 Atherosclerotic heart disease of native coronary artery without angina pectoris: Secondary | ICD-10-CM

## 2023-10-25 DIAGNOSIS — Z515 Encounter for palliative care: Secondary | ICD-10-CM

## 2023-10-25 DIAGNOSIS — C7951 Secondary malignant neoplasm of bone: Secondary | ICD-10-CM | POA: Diagnosis not present

## 2023-10-25 DIAGNOSIS — K5903 Drug induced constipation: Secondary | ICD-10-CM

## 2023-10-25 DIAGNOSIS — Z5111 Encounter for antineoplastic chemotherapy: Secondary | ICD-10-CM | POA: Diagnosis present

## 2023-10-25 DIAGNOSIS — G893 Neoplasm related pain (acute) (chronic): Secondary | ICD-10-CM | POA: Diagnosis not present

## 2023-10-25 LAB — CMP (CANCER CENTER ONLY)
ALT: <5 U/L (ref 0–44)
AST: 10 U/L — ABNORMAL LOW (ref 15–41)
Albumin: 3.8 g/dL (ref 3.5–5.0)
Alkaline Phosphatase: 387 U/L — ABNORMAL HIGH (ref 38–126)
Anion gap: 7 (ref 5–15)
BUN: 8 mg/dL (ref 8–23)
CO2: 27 mmol/L (ref 22–32)
Calcium: 9.1 mg/dL (ref 8.9–10.3)
Chloride: 99 mmol/L (ref 98–111)
Creatinine: 0.76 mg/dL (ref 0.61–1.24)
GFR, Estimated: >60 mL/min (ref >60)
Glucose, Bld: 89 mg/dL (ref 70–99)
Potassium: 3.6 mmol/L (ref 3.5–5.1)
Sodium: 133 mmol/L — ABNORMAL LOW (ref 135–145)
Total Bilirubin: 0.3 mg/dL (ref 0.0–1.2)
Total Protein: 7.5 g/dL (ref 6.5–8.1)

## 2023-10-25 LAB — CBC WITH DIFFERENTIAL (CANCER CENTER ONLY)
Abs Immature Granulocytes: 0.01 10*3/uL (ref 0.00–0.07)
Basophils Absolute: 0 10*3/uL (ref 0.0–0.1)
Basophils Relative: 1 %
Eosinophils Absolute: 0.1 10*3/uL (ref 0.0–0.5)
Eosinophils Relative: 4 %
HCT: 28.4 % — ABNORMAL LOW (ref 39.0–52.0)
Hemoglobin: 9.6 g/dL — ABNORMAL LOW (ref 13.0–17.0)
Immature Granulocytes: 0 %
Lymphocytes Relative: 17 %
Lymphs Abs: 0.6 10*3/uL — ABNORMAL LOW (ref 0.7–4.0)
MCH: 24 pg — ABNORMAL LOW (ref 26.0–34.0)
MCHC: 33.8 g/dL (ref 30.0–36.0)
MCV: 71 fL — ABNORMAL LOW (ref 80.0–100.0)
Monocytes Absolute: 0.5 10*3/uL (ref 0.1–1.0)
Monocytes Relative: 13 %
Neutro Abs: 2.3 10*3/uL (ref 1.7–7.7)
Neutrophils Relative %: 65 %
Platelet Count: 350 10*3/uL (ref 150–400)
RBC: 4 MIL/uL — ABNORMAL LOW (ref 4.22–5.81)
RDW: 15.9 % — ABNORMAL HIGH (ref 11.5–15.5)
WBC Count: 3.6 10*3/uL — ABNORMAL LOW (ref 4.0–10.5)
nRBC: 0 % (ref 0.0–0.2)

## 2023-10-25 MED ORDER — AMLODIPINE BESYLATE 10 MG PO TABS
10.0000 mg | ORAL_TABLET | Freq: Every morning | ORAL | 1 refills | Status: DC
Start: 2023-10-25 — End: 2024-03-28
  Filled 2023-10-25 (×2): qty 90, 90d supply, fill #0

## 2023-10-25 MED ORDER — CETIRIZINE HCL 10 MG PO TABS
10.0000 mg | ORAL_TABLET | Freq: Every day | ORAL | 11 refills | Status: DC
  Filled 2023-10-25 (×2): qty 30, 30d supply, fill #0

## 2023-10-25 MED ORDER — OXYCODONE HCL 10 MG PO TABS
10.0000 mg | ORAL_TABLET | ORAL | 0 refills | Status: DC | PRN
  Filled 2023-10-25 (×3): qty 90, 15d supply, fill #0

## 2023-10-25 MED ORDER — CARVEDILOL 12.5 MG PO TABS
12.5000 mg | ORAL_TABLET | Freq: Two times a day (BID) | ORAL | 1 refills | Status: DC
  Filled 2023-10-25 (×2): qty 180, 90d supply, fill #0
  Filled 2024-01-11: qty 60, 30d supply, fill #0

## 2023-10-25 MED ORDER — ONDANSETRON HCL 4 MG PO TABS
4.0000 mg | ORAL_TABLET | Freq: Three times a day (TID) | ORAL | 2 refills | Status: DC | PRN
  Filled 2023-10-25 (×2): qty 45, 15d supply, fill #0

## 2023-10-25 MED ORDER — DRONABINOL 10 MG PO CAPS
10.0000 mg | ORAL_CAPSULE | Freq: Two times a day (BID) | ORAL | 1 refills | Status: DC
  Filled 2023-10-25: qty 60, 30d supply, fill #0

## 2023-10-25 MED ORDER — LEUPROLIDE ACETATE (3 MONTH) 22.5 MG ~~LOC~~ KIT
22.5000 mg | PACK | Freq: Once | SUBCUTANEOUS | Status: AC
  Administered 2023-10-25: 22.5 mg via SUBCUTANEOUS

## 2023-10-25 MED ORDER — ATORVASTATIN CALCIUM 10 MG PO TABS
10.0000 mg | ORAL_TABLET | Freq: Every morning | ORAL | 1 refills | Status: DC
  Filled 2023-10-25 (×2): qty 90, 90d supply, fill #0

## 2023-10-25 NOTE — Assessment & Plan Note (Addendum)
 On Lipitor Monitor for lipid panel and SEs
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
## 2023-10-25 NOTE — Assessment & Plan Note (Addendum)
 Neg IF and AP antibodies Continue B12 1000 mcg daily

## 2023-10-25 NOTE — Assessment & Plan Note (Addendum)
 Amlodipine  10 mg daily. Refilled today. Coreg  6.25 mg twice daily. Refilled today.

## 2023-10-26 ENCOUNTER — Other Ambulatory Visit: Payer: Self-pay

## 2023-10-26 ENCOUNTER — Other Ambulatory Visit (HOSPITAL_COMMUNITY): Payer: Self-pay

## 2023-10-26 LAB — TESTOSTERONE: Testosterone: 15 ng/dL — ABNORMAL LOW (ref 264–916)

## 2023-10-26 LAB — PROSTATE-SPECIFIC AG, SERUM (LABCORP): Prostate Specific Ag, Serum: 62.2 ng/mL — ABNORMAL HIGH (ref 0.0–4.0)

## 2023-10-27 ENCOUNTER — Other Ambulatory Visit: Payer: Self-pay

## 2023-10-27 ENCOUNTER — Other Ambulatory Visit (HOSPITAL_COMMUNITY): Payer: Self-pay

## 2023-10-31 NOTE — Progress Notes (Signed)
 RN spoke with patient and his wife is currently admitted into the hospital.  Patient has canceled upcoming dental appointment on 6/11, and will wait to reschedule until after his wife gets discharged.   Patient is scheduled for his PSMA PET on 6/13.  RN encouraged patient to keep this appointment.  Patient will plan to keep imaging appointment.  Will continue to follow.

## 2023-11-02 NOTE — Progress Notes (Addendum)
 RN left message for call back regarding upcoming PSMA PET for 6/13.  Additional voicemail left for call back at [phone].

## 2023-11-03 ENCOUNTER — Encounter (HOSPITAL_COMMUNITY)

## 2023-11-07 DIAGNOSIS — C61 Malignant neoplasm of prostate: Secondary | ICD-10-CM

## 2023-11-07 NOTE — Progress Notes (Signed)
 Patient was scheduled for PSMA PET on 6/13 however unfortunately was unable to make appointment.   RN reached out to patient, to assess any barriers or concerns for rescheduling.  Patient reports wife is now out of the hospital.  Patient has been rescheduled for PSMA PET on 6/30 in addition to his follow up's to allow easy of transportation.   New referral placed for dental clearance, as previous location will no longer schedule due to excessive no shows/rescheduling.  Referral to urology has also been placed.   Will continue to follow.

## 2023-11-08 ENCOUNTER — Other Ambulatory Visit: Payer: Self-pay

## 2023-11-08 ENCOUNTER — Emergency Department (HOSPITAL_COMMUNITY)

## 2023-11-08 ENCOUNTER — Inpatient Hospital Stay (HOSPITAL_COMMUNITY)
Admission: EM | Admit: 2023-11-08 | Discharge: 2023-11-12 | DRG: 948 | Disposition: A | Discharge status: Home / Self Care | Attending: Internal Medicine | Admitting: Internal Medicine

## 2023-11-08 DIAGNOSIS — G893 Neoplasm related pain (acute) (chronic): Secondary | ICD-10-CM | POA: Diagnosis not present

## 2023-11-08 DIAGNOSIS — E876 Hypokalemia: Secondary | ICD-10-CM | POA: Diagnosis present

## 2023-11-08 DIAGNOSIS — C7951 Secondary malignant neoplasm of bone: Secondary | ICD-10-CM | POA: Diagnosis present

## 2023-11-08 DIAGNOSIS — R109 Unspecified abdominal pain: Secondary | ICD-10-CM | POA: Diagnosis not present

## 2023-11-08 DIAGNOSIS — R52 Pain, unspecified: Secondary | ICD-10-CM | POA: Diagnosis present

## 2023-11-08 DIAGNOSIS — Z8673 Personal history of transient ischemic attack (TIA), and cerebral infarction without residual deficits: Secondary | ICD-10-CM

## 2023-11-08 DIAGNOSIS — Z5986 Financial insecurity: Secondary | ICD-10-CM

## 2023-11-08 DIAGNOSIS — I252 Old myocardial infarction: Secondary | ICD-10-CM

## 2023-11-08 DIAGNOSIS — Z5941 Food insecurity: Secondary | ICD-10-CM

## 2023-11-08 DIAGNOSIS — C61 Malignant neoplasm of prostate: Secondary | ICD-10-CM | POA: Diagnosis present

## 2023-11-08 DIAGNOSIS — E119 Type 2 diabetes mellitus without complications: Secondary | ICD-10-CM | POA: Diagnosis present

## 2023-11-08 DIAGNOSIS — Z8249 Family history of ischemic heart disease and other diseases of the circulatory system: Secondary | ICD-10-CM

## 2023-11-08 DIAGNOSIS — T402X5A Adverse effect of other opioids, initial encounter: Secondary | ICD-10-CM | POA: Diagnosis present

## 2023-11-08 DIAGNOSIS — Z800 Family history of malignant neoplasm of digestive organs: Secondary | ICD-10-CM

## 2023-11-08 DIAGNOSIS — E785 Hyperlipidemia, unspecified: Secondary | ICD-10-CM | POA: Diagnosis present

## 2023-11-08 DIAGNOSIS — C799 Secondary malignant neoplasm of unspecified site: Principal | ICD-10-CM | POA: Diagnosis present

## 2023-11-08 DIAGNOSIS — F17200 Nicotine dependence, unspecified, uncomplicated: Secondary | ICD-10-CM | POA: Diagnosis present

## 2023-11-08 DIAGNOSIS — H409 Unspecified glaucoma: Secondary | ICD-10-CM | POA: Diagnosis present

## 2023-11-08 DIAGNOSIS — Z191 Hormone sensitive malignancy status: Secondary | ICD-10-CM

## 2023-11-08 DIAGNOSIS — Z5982 Transportation insecurity: Secondary | ICD-10-CM

## 2023-11-08 DIAGNOSIS — Z79899 Other long term (current) drug therapy: Secondary | ICD-10-CM

## 2023-11-08 DIAGNOSIS — Z5948 Other specified lack of adequate food: Secondary | ICD-10-CM

## 2023-11-08 DIAGNOSIS — Z823 Family history of stroke: Secondary | ICD-10-CM

## 2023-11-08 DIAGNOSIS — K5903 Drug induced constipation: Secondary | ICD-10-CM | POA: Diagnosis present

## 2023-11-08 DIAGNOSIS — R197 Diarrhea, unspecified: Secondary | ICD-10-CM | POA: Diagnosis present

## 2023-11-08 DIAGNOSIS — I10 Essential (primary) hypertension: Secondary | ICD-10-CM | POA: Diagnosis present

## 2023-11-08 DIAGNOSIS — Z8711 Personal history of peptic ulcer disease: Secondary | ICD-10-CM

## 2023-11-08 DIAGNOSIS — E871 Hypo-osmolality and hyponatremia: Secondary | ICD-10-CM | POA: Diagnosis present

## 2023-11-08 DIAGNOSIS — Z888 Allergy status to other drugs, medicaments and biological substances status: Secondary | ICD-10-CM

## 2023-11-08 NOTE — ED Triage Notes (Addendum)
 Pt came in with c/o left and right sided pain around his midsection for three days. Pt also c/o chest tightness and N/V. Pt stated there was a lot of phlegm.   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

## 2023-11-09 ENCOUNTER — Inpatient Hospital Stay (HOSPITAL_COMMUNITY)

## 2023-11-09 ENCOUNTER — Emergency Department (HOSPITAL_COMMUNITY)

## 2023-11-09 ENCOUNTER — Other Ambulatory Visit: Payer: Self-pay

## 2023-11-09 DIAGNOSIS — I10 Essential (primary) hypertension: Secondary | ICD-10-CM | POA: Diagnosis present

## 2023-11-09 DIAGNOSIS — Z888 Allergy status to other drugs, medicaments and biological substances status: Secondary | ICD-10-CM | POA: Diagnosis not present

## 2023-11-09 DIAGNOSIS — H409 Unspecified glaucoma: Secondary | ICD-10-CM | POA: Diagnosis present

## 2023-11-09 DIAGNOSIS — C7951 Secondary malignant neoplasm of bone: Secondary | ICD-10-CM

## 2023-11-09 DIAGNOSIS — C799 Secondary malignant neoplasm of unspecified site: Secondary | ICD-10-CM

## 2023-11-09 DIAGNOSIS — I252 Old myocardial infarction: Secondary | ICD-10-CM | POA: Diagnosis not present

## 2023-11-09 DIAGNOSIS — Z515 Encounter for palliative care: Secondary | ICD-10-CM | POA: Diagnosis not present

## 2023-11-09 DIAGNOSIS — R52 Pain, unspecified: Secondary | ICD-10-CM | POA: Diagnosis present

## 2023-11-09 DIAGNOSIS — E876 Hypokalemia: Secondary | ICD-10-CM | POA: Diagnosis present

## 2023-11-09 DIAGNOSIS — K5903 Drug induced constipation: Secondary | ICD-10-CM | POA: Diagnosis present

## 2023-11-09 DIAGNOSIS — Z8249 Family history of ischemic heart disease and other diseases of the circulatory system: Secondary | ICD-10-CM | POA: Diagnosis not present

## 2023-11-09 DIAGNOSIS — E871 Hypo-osmolality and hyponatremia: Secondary | ICD-10-CM | POA: Diagnosis present

## 2023-11-09 DIAGNOSIS — Z7189 Other specified counseling: Secondary | ICD-10-CM

## 2023-11-09 DIAGNOSIS — Z5982 Transportation insecurity: Secondary | ICD-10-CM | POA: Diagnosis not present

## 2023-11-09 DIAGNOSIS — Z8673 Personal history of transient ischemic attack (TIA), and cerebral infarction without residual deficits: Secondary | ICD-10-CM | POA: Diagnosis not present

## 2023-11-09 DIAGNOSIS — Z823 Family history of stroke: Secondary | ICD-10-CM | POA: Diagnosis not present

## 2023-11-09 DIAGNOSIS — T402X5A Adverse effect of other opioids, initial encounter: Secondary | ICD-10-CM | POA: Diagnosis present

## 2023-11-09 DIAGNOSIS — Z191 Hormone sensitive malignancy status: Secondary | ICD-10-CM | POA: Diagnosis not present

## 2023-11-09 DIAGNOSIS — R197 Diarrhea, unspecified: Secondary | ICD-10-CM | POA: Insufficient documentation

## 2023-11-09 DIAGNOSIS — Z5986 Financial insecurity: Secondary | ICD-10-CM | POA: Diagnosis not present

## 2023-11-09 DIAGNOSIS — R110 Nausea: Secondary | ICD-10-CM

## 2023-11-09 DIAGNOSIS — Z5941 Food insecurity: Secondary | ICD-10-CM | POA: Diagnosis not present

## 2023-11-09 DIAGNOSIS — G893 Neoplasm related pain (acute) (chronic): Principal | ICD-10-CM

## 2023-11-09 DIAGNOSIS — Z79899 Other long term (current) drug therapy: Secondary | ICD-10-CM | POA: Diagnosis not present

## 2023-11-09 DIAGNOSIS — C61 Malignant neoplasm of prostate: Secondary | ICD-10-CM | POA: Diagnosis present

## 2023-11-09 DIAGNOSIS — Z8711 Personal history of peptic ulcer disease: Secondary | ICD-10-CM | POA: Diagnosis not present

## 2023-11-09 DIAGNOSIS — E119 Type 2 diabetes mellitus without complications: Secondary | ICD-10-CM | POA: Diagnosis present

## 2023-11-09 DIAGNOSIS — F17200 Nicotine dependence, unspecified, uncomplicated: Secondary | ICD-10-CM | POA: Diagnosis present

## 2023-11-09 DIAGNOSIS — Z800 Family history of malignant neoplasm of digestive organs: Secondary | ICD-10-CM | POA: Diagnosis not present

## 2023-11-09 DIAGNOSIS — E785 Hyperlipidemia, unspecified: Secondary | ICD-10-CM | POA: Diagnosis present

## 2023-11-09 DIAGNOSIS — R109 Unspecified abdominal pain: Secondary | ICD-10-CM | POA: Diagnosis present

## 2023-11-09 LAB — CBC
HCT: 30.1 % — ABNORMAL LOW (ref 39.0–52.0)
HCT: 26.9 % — ABNORMAL LOW (ref 39.0–52.0)
Hemoglobin: 9.6 g/dL — ABNORMAL LOW (ref 13.0–17.0)
Hemoglobin: 8.5 g/dL — ABNORMAL LOW (ref 13.0–17.0)
MCH: 23.9 pg — ABNORMAL LOW (ref 26.0–34.0)
MCH: 23.8 pg — ABNORMAL LOW (ref 26.0–34.0)
MCHC: 31.9 g/dL (ref 30.0–36.0)
MCHC: 31.6 g/dL (ref 30.0–36.0)
MCV: 74.9 fL — ABNORMAL LOW (ref 80.0–100.0)
MCV: 75.4 fL — ABNORMAL LOW (ref 80.0–100.0)
Platelets: 277 10*3/uL (ref 150–400)
Platelets: 208 10*3/uL (ref 150–400)
RBC: 4.02 MIL/uL — ABNORMAL LOW (ref 4.22–5.81)
RBC: 3.57 MIL/uL — ABNORMAL LOW (ref 4.22–5.81)
RDW: 16.3 % — ABNORMAL HIGH (ref 11.5–15.5)
RDW: 16.1 % — ABNORMAL HIGH (ref 11.5–15.5)
WBC: 7.1 10*3/uL (ref 4.0–10.5)
WBC: 5.9 10*3/uL (ref 4.0–10.5)
nRBC: 0 % (ref 0.0–0.2)
nRBC: 0 % (ref 0.0–0.2)

## 2023-11-09 LAB — LACTIC ACID, PLASMA
Lactic Acid, Venous: 1 mmol/L (ref 0.5–1.9)
Lactic Acid, Venous: 1.7 mmol/L (ref 0.5–1.9)

## 2023-11-09 LAB — COMPREHENSIVE METABOLIC PANEL WITH GFR
ALT: 8 U/L (ref 0–44)
ALT: 8 U/L (ref 0–44)
AST: 18 U/L (ref 15–41)
AST: 13 U/L — ABNORMAL LOW (ref 15–41)
Albumin: 3.2 g/dL — ABNORMAL LOW (ref 3.5–5.0)
Albumin: 2.9 g/dL — ABNORMAL LOW (ref 3.5–5.0)
Alkaline Phosphatase: 520 U/L — ABNORMAL HIGH (ref 38–126)
Alkaline Phosphatase: 446 U/L — ABNORMAL HIGH (ref 38–126)
Anion gap: 11 (ref 5–15)
Anion gap: 11 (ref 5–15)
BUN: 16 mg/dL (ref 8–23)
BUN: 14 mg/dL (ref 8–23)
CO2: 24 mmol/L (ref 22–32)
CO2: 23 mmol/L (ref 22–32)
Calcium: 8.2 mg/dL — ABNORMAL LOW (ref 8.9–10.3)
Calcium: 8.4 mg/dL — ABNORMAL LOW (ref 8.9–10.3)
Chloride: 95 mmol/L — ABNORMAL LOW (ref 98–111)
Chloride: 97 mmol/L — ABNORMAL LOW (ref 98–111)
Creatinine, Ser: 0.86 mg/dL (ref 0.61–1.24)
Creatinine, Ser: 0.77 mg/dL (ref 0.61–1.24)
GFR, Estimated: >60 mL/min (ref >60)
GFR, Estimated: >60 mL/min (ref >60)
Glucose, Bld: 107 mg/dL — ABNORMAL HIGH (ref 70–99)
Glucose, Bld: 105 mg/dL — ABNORMAL HIGH (ref 70–99)
Potassium: 3.1 mmol/L — ABNORMAL LOW (ref 3.5–5.1)
Potassium: 2.9 mmol/L — ABNORMAL LOW (ref 3.5–5.1)
Sodium: 130 mmol/L — ABNORMAL LOW (ref 135–145)
Sodium: 131 mmol/L — ABNORMAL LOW (ref 135–145)
Total Bilirubin: 0.5 mg/dL (ref 0.0–1.2)
Total Bilirubin: 0.3 mg/dL (ref 0.0–1.2)
Total Protein: 6.9 g/dL (ref 6.5–8.1)
Total Protein: 6.4 g/dL — ABNORMAL LOW (ref 6.5–8.1)

## 2023-11-09 LAB — TROPONIN I (HIGH SENSITIVITY)
Troponin I (High Sensitivity): 7 ng/L (ref <18)
Troponin I (High Sensitivity): 7 ng/L (ref <18)

## 2023-11-09 LAB — CBG MONITORING, ED
Glucose-Capillary: 64 mg/dL — ABNORMAL LOW (ref 70–99)
Glucose-Capillary: 103 mg/dL — ABNORMAL HIGH (ref 70–99)

## 2023-11-09 LAB — LIPASE, BLOOD: Lipase: 24 U/L (ref 11–51)

## 2023-11-09 LAB — GLUCOSE, CAPILLARY
Glucose-Capillary: 117 mg/dL — ABNORMAL HIGH (ref 70–99)
Glucose-Capillary: 231 mg/dL — ABNORMAL HIGH (ref 70–99)

## 2023-11-09 LAB — PROTIME-INR
INR: 1.3 — ABNORMAL HIGH (ref 0.8–1.2)
Prothrombin Time: 15.9 s — ABNORMAL HIGH (ref 11.4–15.2)

## 2023-11-09 LAB — D-DIMER, QUANTITATIVE: D-Dimer, Quant: 1.69 ug{FEU}/mL — ABNORMAL HIGH (ref 0.00–0.50)

## 2023-11-09 MED ORDER — AMLODIPINE BESYLATE 10 MG PO TABS
10.0000 mg | ORAL_TABLET | Freq: Every morning | ORAL | Status: DC
  Administered 2023-11-10 – 2023-11-12 (×3): 10 mg via ORAL
  Filled 2023-11-09: qty 2
  Filled 2023-11-09 (×3): qty 1

## 2023-11-09 MED ORDER — HYDROMORPHONE HCL 1 MG/ML IJ SOLN
1.0000 mg | Freq: Once | INTRAMUSCULAR | Status: AC
  Filled 2023-11-09: qty 1

## 2023-11-09 MED ORDER — MORPHINE SULFATE (PF) 4 MG/ML IV SOLN
4.0000 mg | Freq: Once | INTRAVENOUS | Status: AC
  Filled 2023-11-09: qty 1

## 2023-11-09 MED ORDER — HEPARIN SODIUM (PORCINE) 5000 UNIT/ML IJ SOLN
5000.0000 [IU] | Freq: Three times a day (TID) | INTRAMUSCULAR | Status: DC
  Administered 2023-11-09 – 2023-11-11 (×6): 5000 [IU] via SUBCUTANEOUS
  Filled 2023-11-09 (×9): qty 1

## 2023-11-09 MED ORDER — OXYCODONE HCL 5 MG PO TABS
10.0000 mg | ORAL_TABLET | ORAL | Status: DC | PRN
  Administered 2023-11-10 (×2): 15 mg via ORAL
  Administered 2023-11-10: 10 mg via ORAL
  Filled 2023-11-09: qty 2
  Filled 2023-11-09 (×2): qty 3

## 2023-11-09 MED ORDER — DRONABINOL 5 MG PO CAPS
10.0000 mg | ORAL_CAPSULE | Freq: Two times a day (BID) | ORAL | Status: DC
  Administered 2023-11-10 – 2023-11-12 (×5): 10 mg via ORAL
  Filled 2023-11-09 (×7): qty 2

## 2023-11-09 MED ORDER — DEXAMETHASONE SODIUM PHOSPHATE 4 MG/ML IJ SOLN
4.0000 mg | Freq: Every day | INTRAMUSCULAR | Status: AC
  Administered 2023-11-10 – 2023-11-11 (×2): 4 mg via INTRAVENOUS
  Filled 2023-11-09 (×3): qty 1

## 2023-11-09 MED ORDER — SODIUM CHLORIDE 0.9 % IV SOLN: INTRAVENOUS | Status: AC

## 2023-11-09 MED ORDER — HYDROMORPHONE HCL 1 MG/ML IJ SOLN
1.0000 mg | INTRAMUSCULAR | Status: DC | PRN
  Filled 2023-11-09 (×3): qty 1

## 2023-11-09 MED ORDER — OXYCODONE HCL ER 15 MG PO T12A
15.0000 mg | EXTENDED_RELEASE_TABLET | Freq: Two times a day (BID) | ORAL | Status: DC
  Administered 2023-11-10 – 2023-11-12 (×5): 15 mg via ORAL
  Filled 2023-11-09 (×6): qty 1

## 2023-11-09 MED ORDER — CARVEDILOL 12.5 MG PO TABS
12.5000 mg | ORAL_TABLET | Freq: Two times a day (BID) | ORAL | Status: DC
  Administered 2023-11-10 – 2023-11-12 (×5): 12.5 mg via ORAL
  Filled 2023-11-09 (×7): qty 1

## 2023-11-09 MED ORDER — IOHEXOL 350 MG/ML SOLN: 100.0000 mL | Freq: Once | INTRAVENOUS | Status: AC | PRN

## 2023-11-09 MED ORDER — POLYETHYLENE GLYCOL 3350 17 G PO PACK
17.0000 g | PACK | Freq: Every day | ORAL | Status: DC
  Filled 2023-11-09 (×2): qty 1

## 2023-11-09 MED ORDER — MORPHINE SULFATE ER 15 MG PO TBCR
15.0000 mg | EXTENDED_RELEASE_TABLET | Freq: Two times a day (BID) | ORAL | Status: DC
  Filled 2023-11-09: qty 1

## 2023-11-09 MED ORDER — ONDANSETRON HCL 4 MG PO TABS
4.0000 mg | ORAL_TABLET | Freq: Four times a day (QID) | ORAL | Status: DC | PRN
  Filled 2023-11-09: qty 1

## 2023-11-09 MED ORDER — ATORVASTATIN CALCIUM 10 MG PO TABS
10.0000 mg | ORAL_TABLET | Freq: Every morning | ORAL | Status: DC
  Administered 2023-11-10 – 2023-11-12 (×3): 10 mg via ORAL
  Filled 2023-11-09 (×4): qty 1

## 2023-11-09 MED ORDER — ONDANSETRON HCL 4 MG/2ML IJ SOLN
4.0000 mg | Freq: Once | INTRAMUSCULAR | Status: AC
  Filled 2023-11-09: qty 2

## 2023-11-09 MED ORDER — ONDANSETRON HCL 4 MG/2ML IJ SOLN
4.0000 mg | Freq: Four times a day (QID) | INTRAMUSCULAR | Status: DC | PRN
  Administered 2023-11-10 (×3): 4 mg via INTRAVENOUS
  Filled 2023-11-09 (×3): qty 2

## 2023-11-09 MED ORDER — ALBUTEROL SULFATE HFA 108 (90 BASE) MCG/ACT IN AERS: 1.0000 | INHALATION_SPRAY | Freq: Four times a day (QID) | RESPIRATORY_TRACT | Status: DC | PRN

## 2023-11-09 MED ORDER — SENNOSIDES-DOCUSATE SODIUM 8.6-50 MG PO TABS: 1.0000 | ORAL_TABLET | Freq: Two times a day (BID) | ORAL | Status: DC | PRN

## 2023-11-09 MED ORDER — HYDROMORPHONE HCL 1 MG/ML IJ SOLN
1.0000 mg | INTRAMUSCULAR | Status: DC | PRN
  Administered 2023-11-10 (×4): 2 mg via INTRAVENOUS
  Filled 2023-11-09 (×5): qty 2

## 2023-11-09 MED ORDER — ALBUTEROL SULFATE (2.5 MG/3ML) 0.083% IN NEBU: 2.5000 mg | INHALATION_SOLUTION | Freq: Four times a day (QID) | RESPIRATORY_TRACT | Status: DC | PRN

## 2023-11-09 NOTE — H&P (Signed)
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 abnormality. IMPRESSION: 1. Mild residual left lung opacities, favoring postinfectious or inflammatory scarring. Electronically signed by: Zadie Herter MD 11/09/2023 12:24 AM EDT RP Workstation: [id number]

## 2023-11-09 NOTE — Assessment & Plan Note (Addendum)
 Diarrhea for 3 days. Continue monitor of work up per IM if not resolve today

## 2023-11-09 NOTE — Assessment & Plan Note (Addendum)
 Continue home darolutamide . Refer to outpatient evaluation for Pluvicto. Outpatient PET has been scheduled. Should keep appointment.

## 2023-11-09 NOTE — ED Provider Notes (Signed)
 [place] EMERGENCY DEPARTMENT AT [facility] - Inpatient Provider Note   CSN: [id number] Arrival date & time: 11/08/23  2251     Patient presents with: Emesis and Abdominal Pain   Troy Mclaughlin is a 68 y.o. male.  {Add pertinent medical, surgical, social history, OB history to WUJ:81191} Patient with history of colon cancer but chart review shows history of prostate cancer.  Presents with pain to his low back bilaterally that wraps around to his abdomen.  Pain has been constant for the past 3 days.  Nothing makes it better or worse.  He reports this pain is new.  No fall or trauma.  Pain starts in his bilateral flanks and radiates across his abdomen.  1 episode of vomiting today.  No diarrhea.  Some cough and chest pain with coughing and shortness of breath.  No fever.  No pain with urination or blood in the urine.  No bowel or bladder incontinence.  Reports this pain is new in the past 3 days.  Has not had any pain with urination or blood in the urine.  No fever.  Taking his pain medication at home without relief.  The history is provided by the patient.  Emesis Associated symptoms: abdominal pain, arthralgias, cough and myalgias   Associated symptoms: no diarrhea, no fever and no headaches   Abdominal Pain Associated symptoms: cough, nausea, shortness of breath and vomiting   Associated symptoms: no chest pain, no diarrhea, no dysuria and no fever        Prior to Admission medications   Medication Sig Start Date End Date Taking? Authorizing Provider  acetaminophen  (TYLENOL ) 500 MG tablet Take 2 tablets (1,000 mg total) by mouth every 6 (six) hours as needed for moderate pain or mild pain. 05/05/21   Debbra Fairy, PA-C  albuterol  (VENTOLIN  HFA) 108 (90 Base) MCG/ACT inhaler Inhale 1-2 puffs into the lungs every 6 (six) hours as needed for wheezing or shortness of breath. 08/21/23   Pickenpack-Cousar, Athena N, NP  amLODipine  (NORVASC ) 10 MG tablet Take 1 tablet (10 mg total) by  mouth every morning. 10/25/23   Lowanda Ruddy, MD  atorvastatin  (LIPITOR) 10 MG tablet Take 1 tablet (10 mg total) by mouth every morning. 10/25/23   Lowanda Ruddy, MD  benzonatate  (TESSALON ) 100 MG capsule Take 1 capsule (100 mg total) by mouth 3 (three) times daily as needed for cough. 09/18/23   Lowanda Ruddy, MD  carvedilol  (COREG ) 12.5 MG tablet Take 1 tablet (12.5 mg total) by mouth 2 (two) times daily with a meal. 10/25/23   Lowanda Ruddy, MD  cetirizine  (ZYRTEC ) 10 MG tablet Take 1 tablet (10 mg total) by mouth daily. 10/25/23   Pickenpack-Cousar, Giles Labrum, NP  cyanocobalamin  (VITAMIN B12) 1000 MCG tablet Take 1 tablet (1,000 mcg total) by mouth daily. 05/22/23   Lowanda Ruddy, MD  darolutamide  (NUBEQA ) 300 MG tablet Take 2 tablets (600 mg total) by mouth 2 (two) times daily with a meal. 06/19/23   Lowanda Ruddy, MD  dronabinol  (MARINOL ) 10 MG capsule Take 1 capsule (10 mg total) by mouth 2 (two) times daily before a meal. 10/25/23   Pickenpack-Cousar, Giles Labrum, NP  naloxegol  oxalate (MOVANTIK ) 12.5 MG TABS tablet Take 1 tablet (12.5 mg total) by mouth daily. 08/21/23   Pickenpack-Cousar, Athena N, NP  nicotine  (NICODERM CQ  - DOSED IN MG/24 HOURS) 14 mg/24hr patch Place 1 patch (14 mg total) onto the skin daily. 09/18/23   Pickenpack-Cousar, Giles Labrum,  NP  nitroGLYCERIN  (NITROSTAT ) 0.4 MG SL tablet DISSOLVE 1 TABLET UNDER THE TONGUE EVERY 5 MINUTES AS NEEDED FOR CHEST PAIN. DO NOT EXCEED A TOTAL OF 3 DOSES IN 15 MINUTES. 02/03/21   Lawrance Presume, MD  nitroGLYCERIN  (NITROSTAT ) 0.4 MG SL tablet Place 1 tablet under the tongue every 15 minutes up to 3 doses within 15 minutes as needed for chest pain 08/04/23   Jerrlyn Morel, NP  ondansetron  (ZOFRAN ) 4 MG tablet Take 1 tablet (4 mg total) by mouth every 8 (eight) hours as needed for nausea 10/25/23   Pickenpack-Cousar, Giles Labrum, NP  oxyCODONE  ER (XTAMPZA  ER) 9 MG C12A Take 1 capsule (9 mg) by mouth every 12 (twelve) hours. 10/19/23    Pickenpack-Cousar, Athena N, NP  Oxycodone  HCl 10 MG TABS Take 1 tablet (10 mg total) by mouth every 4 (four) hours as needed (breakthrough pain, severe pain). 10/25/23   Pickenpack-Cousar, Giles Labrum, NP  polyethylene glycol powder (MIRALAX ) 17 GM/SCOOP powder Take 17 grams dissolved in liquid by mouth daily. 08/04/23   Jerrlyn Morel, NP  senna-docusate (SENOKOT-S) 8.6-50 MG tablet Take 1 tablet by mouth 2 (two) times daily. 07/19/23   Leona Rake, MD  sildenafil  (VIAGRA ) 100 MG tablet Take 1 tablet by mouth 30 minutes before sexual activity as needed for erectile dysfunction 07/19/23   Rafe Bunde, DO  traZODone  (DESYREL ) 50 MG tablet Take 1 tablet (50 mg total) by mouth at bedtime. 09/06/23   Jerrlyn Morel, NP    Allergies: Lisinopril  and Metformin  and related    Review of Systems  Constitutional:  Negative for activity change, appetite change and fever.  HENT:  Negative for congestion and rhinorrhea.   Respiratory:  Positive for cough, chest tightness and shortness of breath.   Cardiovascular:  Negative for chest pain.  Gastrointestinal:  Positive for abdominal pain, nausea and vomiting. Negative for diarrhea.  Genitourinary:  Negative for dysuria and penile pain.  Musculoskeletal:  Positive for arthralgias, back pain and myalgias.  Skin:  Negative for rash.  Neurological:  Negative for dizziness, weakness and headaches.   all other systems are negative except as noted in the HPI and PMH.    Updated Vital Signs BP (!) 141/81   Pulse 80   Temp 98.1 F (36.7 C) (Oral)   Resp 18   SpO2 98%   Physical Exam Vitals and nursing note reviewed.  Constitutional:      General: He is not in acute distress.    Appearance: He is well-developed. He is not ill-appearing.  HENT:     Head: Normocephalic and atraumatic.     Mouth/Throat:     Pharynx: No oropharyngeal exudate.   Eyes:     Conjunctiva/sclera: Conjunctivae normal.     Pupils: Pupils are equal, round, and reactive  to light.   Neck:     Comments: No meningismus. Cardiovascular:     Rate and Rhythm: Normal rate and regular rhythm.     Heart sounds: Normal heart sounds. No murmur heard. Pulmonary:     Effort: Pulmonary effort is normal. No respiratory distress.     Breath sounds: Normal breath sounds.  Abdominal:     Palpations: Abdomen is soft.     Tenderness: There is abdominal tenderness. There is guarding. There is no rebound.   Musculoskeletal:        General: Tenderness present. Normal range of motion.     Cervical back: Normal range of motion and neck supple.  Comments: Paraspinal lumbar tenderness bilaterally, no midline tenderness   Skin:    General: Skin is warm.   Neurological:     Mental Status: He is alert and oriented to person, place, and time.     Cranial Nerves: No cranial nerve deficit.     Motor: No abnormal muscle tone.     Coordination: Coordination normal.     Comments:  5/5 strength throughout. CN 2-12 intact.Equal grip strength.   Psychiatric:        Behavior: Behavior normal.     (all labs ordered are listed, but only abnormal results are displayed) Labs Reviewed  CULTURE, BLOOD (ROUTINE X 2)  CULTURE, BLOOD (ROUTINE X 2)  COMPREHENSIVE METABOLIC PANEL WITH GFR  CBC  LIPASE, BLOOD  D-DIMER, QUANTITATIVE  LACTIC ACID, PLASMA  LACTIC ACID, PLASMA  TROPONIN I (HIGH SENSITIVITY)    EKG: EKG Interpretation Date/Time:  Wednesday November 08 2023 23:13:48 EDT Ventricular Rate:  77 PR Interval:  148 QRS Duration:  94 QT Interval:  385 QTC Calculation: 436 R Axis:   42  Text Interpretation: Sinus rhythm Borderline low voltage, extremity leads duplicate Confirmed by Earma Gloss [phone]) on 11/08/2023 11:47:34 PM  Radiology: No results found.  {Document cardiac monitor, telemetry assessment procedure when appropriate:32947} Procedures   Medications Ordered in the ED - No data to display    {Click here for ABCD2, HEART and other calculators REFRESH  Note before signing:1}                              Medical Decision Making Amount and/or Complexity of Data Reviewed Labs: ordered. Decision-making details documented in ED Course. Radiology: ordered and independent interpretation performed. Decision-making details documented in ED Course. ECG/medicine tests: ordered and independent interpretation performed. Decision-making details documented in ED Course.   - 3 days of lower abdominal pain that radiates to his abdomen.  History of colostomy takedown in the past.  No fever.  Vitals are stable.  No distress.  Abdomen soft without peritoneal signs.  Intact distal strength, sensation, pulses and reflexes.  Low concern for cord compression or cauda equina.  {Document critical care time when appropriate  Document review of labs and clinical decision tools ie CHADS2VASC2, etc  Document your independent review of radiology images and any outside records  Document your discussion with family members, caretakers and with consultants  Document social determinants of health affecting pt's care  Document your decision making why or why not admission, treatments were needed:32947:::1}   Final diagnoses:  None    ED Discharge Orders     None

## 2023-11-09 NOTE — Consult Note (Signed)
 Palliative Care Consult Note                                  Date: 11/09/2023   Patient Name: Troy Mclaughlin  DOB: 10/12/1955  MRN: [id number]  Age / Sex: 67 y.o., male  PCP: Jerrlyn Morel, NP Referring Physician: Junita Oliva, DO  Reason for Consultation: Pain control  HPI/Patient Profile: 68 y.o. 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   Imaging showed mildly progressive metastases.  PSA increased this month.   Palliative Medicine has been consulted for goals of care discussions. Patient and family are faced with anticipatory care needs and complex medical decision making.   Clinical Assessment and Goals of Care:   Extensive chart review has been completed including labs, vital signs, imaging, progress/consult notes, orders, medications and available advance directive documents.    I met with patient in the ED to discuss diagnosis, prognosis, GOC, and symptom management.  Patient is known to PMT at the outpatient palliative care clinic at [facility], with most recent visit on 10/25/2023.  I re-introduced Palliative Medicine as specialized medical care for people living with serious illness. It focuses on providing relief from the symptoms and stress of a serious illness.   Created space and opportunity for patient to express thoughts and feelings regarding current medical situation. Values and goals of care were attempted to be elicited.   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 He lives at home with his wife. He has 1 son.  Functional Status: *** Palliative Symptoms:  Discussion: We discussed patient's current illness and what it means in the larger context of his ongoing co-morbidities.  Current clinical status was reviewed. Natural disease trajectory of *** was discussed.  A discussion was had today regarding advanced directives. We discussed code status and scopes of care. We discussed the difference between full scope versus limited interventions versus comfort care. The MOST form was introduced and discussed.  Discussed the importance of continued conversation with the medical team regarding overall plan of care and treatment options, ensuring decisions are within the context of the patients values and GOCs.  Questions and concerns addressed. Patient/family encouraged to call with questions or concerns.   Review of Systems  Gastrointestinal:  Positive for nausea.  Musculoskeletal:  Positive for back pain.    Objective:   Primary Diagnoses: Present on Admission:  Intractable pain  Malignant neoplasm of prostate (HCC)  Metastatic malignant neoplasm [facility])   Physical Exam Vitals reviewed.  Constitutional:      General: He is not in acute distress.    Comments: Chronically ill-appearing  Pulmonary:     Effort: Pulmonary effort is normal.   Neurological:     Mental Status: He is alert and oriented to person, place, and time.   Psychiatric:        Behavior: Behavior normal.    Palliative Assessment/Data: PPS 60%     Assessment & Plan:   SUMMARY OF RECOMMENDATIONS   Continue full scope care Encouraged patient to designate a HCPOA PMT will continue to follow  Primary Decision Maker: PATIENT  Existing Vynca/ACP Documentation: None  Code Status/Advance Care Planning: Full code  Symptom Management:  Cancer-Related Pain: At home, patient was on Xtampza  9 mg every 12 hours (equivalent to Oxycontin  10 mg).  Patient needs this dose increased as his pain is not currently well-controlled.  As Xtampza  is not available in the hospital, I will order OxyContin  15 mg every 12 hours Discontinue MS Contin  For breakthrough pain: oxycodone  10 to 15 mg every 4  hours as needed OR Dilaudid  1 mg every 3 hours as needed  Opioid-Induced Constipation:   Other Symptoms: Ondansetron  4 mg every 6 hours as needed for nausea Continue Marinol  twice daily for appetite   Prognosis:  Unable to determine  Discharge Planning:  To Be Determined   Discussed with: ***    Thank you for allowing us  to participate in the care of Troy Mclaughlin   Time Total: ***  Greater than 50%  of this time was spent counseling and coordinating care related to the above assessment and plan.  Signed by: Maisie Scotland, NP Palliative Medicine Team  Team Phone # [phone]  For individual providers, please see AMION

## 2023-11-09 NOTE — Plan of Care (Signed)
  Problem: Coping: Goal: Level of anxiety will decrease Outcome: Progressing   Problem: Pain Managment: Goal: General experience of comfort will improve and/or be controlled Outcome: Progressing   Problem: Safety: Goal: Ability to remain free from injury will improve Outcome: Progressing   Problem: Skin Integrity: Goal: Risk for impaired skin integrity will decrease Outcome: Progressing

## 2023-11-09 NOTE — Assessment & Plan Note (Signed)
 Currently on morphine  with as needed Dilaudid  Continue to work on pain control with hospital medicine and palliative care

## 2023-11-09 NOTE — Progress Notes (Signed)
 The patient is a 68 yr old man carries a past medical history significant for hyperlipidemia, hypertension, DM II, GERD, history of TIA, metastatic prostate cancer on darolutamide  600 mg bid as well as oxycodone  ER, and Oxy IR. The patient's is complaining of refractory cancer pain not fully enough addressed by his current regimen. He is now receiving IV dilaudid . Palliative care has been consulted for pain control  The patient is receiving IV dilaudid . He states that his pain is still uncontrolled with increase dose.  The patient was admitted as an in patient earlier today.
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
## 2023-11-10 DIAGNOSIS — R52 Pain, unspecified: Secondary | ICD-10-CM | POA: Diagnosis not present

## 2023-11-10 LAB — CBC WITH DIFFERENTIAL/PLATELET
Abs Immature Granulocytes: 0.04 10*3/uL (ref 0.00–0.07)
Basophils Absolute: 0 10*3/uL (ref 0.0–0.1)
Basophils Relative: 0 %
Eosinophils Absolute: 0 10*3/uL (ref 0.0–0.5)
Eosinophils Relative: 0 %
HCT: 27.9 % — ABNORMAL LOW (ref 39.0–52.0)
Hemoglobin: 8.9 g/dL — ABNORMAL LOW (ref 13.0–17.0)
Immature Granulocytes: 1 %
Lymphocytes Relative: 4 %
Lymphs Abs: 0.3 10*3/uL — ABNORMAL LOW (ref 0.7–4.0)
MCH: 24.4 pg — ABNORMAL LOW (ref 26.0–34.0)
MCHC: 31.9 g/dL (ref 30.0–36.0)
MCV: 76.4 fL — ABNORMAL LOW (ref 80.0–100.0)
Monocytes Absolute: 0.4 10*3/uL (ref 0.1–1.0)
Monocytes Relative: 5 %
Neutro Abs: 5.9 10*3/uL (ref 1.7–7.7)
Neutrophils Relative %: 90 %
Platelets: 232 10*3/uL (ref 150–400)
RBC: 3.65 MIL/uL — ABNORMAL LOW (ref 4.22–5.81)
RDW: 16.5 % — ABNORMAL HIGH (ref 11.5–15.5)
WBC: 6.6 10*3/uL (ref 4.0–10.5)
nRBC: 0 % (ref 0.0–0.2)

## 2023-11-10 LAB — BASIC METABOLIC PANEL WITH GFR
Anion gap: 10 (ref 5–15)
BUN: 13 mg/dL (ref 8–23)
CO2: 24 mmol/L (ref 22–32)
Calcium: 8.6 mg/dL — ABNORMAL LOW (ref 8.9–10.3)
Chloride: 99 mmol/L (ref 98–111)
Creatinine, Ser: 0.72 mg/dL (ref 0.61–1.24)
GFR, Estimated: >60 mL/min (ref >60)
Glucose, Bld: 138 mg/dL — ABNORMAL HIGH (ref 70–99)
Potassium: 3.9 mmol/L (ref 3.5–5.1)
Sodium: 133 mmol/L — ABNORMAL LOW (ref 135–145)

## 2023-11-10 LAB — GLUCOSE, CAPILLARY
Glucose-Capillary: 143 mg/dL — ABNORMAL HIGH (ref 70–99)
Glucose-Capillary: 140 mg/dL — ABNORMAL HIGH (ref 70–99)
Glucose-Capillary: 121 mg/dL — ABNORMAL HIGH (ref 70–99)

## 2023-11-10 MED ORDER — PROCHLORPERAZINE EDISYLATE 10 MG/2ML IJ SOLN
10.0000 mg | Freq: Four times a day (QID) | INTRAMUSCULAR | Status: DC | PRN
  Administered 2023-11-10: 10 mg via INTRAVENOUS
  Filled 2023-11-10: qty 2

## 2023-11-10 MED ORDER — HYDROMORPHONE HCL 2 MG PO TABS
4.0000 mg | ORAL_TABLET | ORAL | Status: DC | PRN
  Administered 2023-11-10 – 2023-11-11 (×3): 4 mg via ORAL
  Filled 2023-11-10 (×3): qty 2

## 2023-11-10 MED ORDER — INSULIN ASPART 100 UNIT/ML IJ SOLN: 0.0000 [IU] | Freq: Three times a day (TID) | INTRAMUSCULAR | Status: DC

## 2023-11-10 NOTE — Progress Notes (Signed)
 Palliative Medicine Progress Note   Patient Name: Troy Mclaughlin       Date: 11/10/2023 DOB: 06/29/1955  Age: 68 y.o. MRN#: [id number] Attending Physician: Soledad Ligas, DO Primary Care Physician: Oley Bascom RAMAN, NP Admit Date: 11/08/2023  Reason for Consultation/Follow-up: pain control  HPI/Patient Profile: 68 y.o. male  with past medical history of metastatic prostate cancer on darolutamide , diabetes type 2, GERD, history of TIA, HLD, and HTN who presented to the ED on 11/08/2023 and is admitted with refractory cancer related pain and nausea.  CT abdomen/pelvis showed widespread osseous metastasis throughout the axial and appendicular skeleton, progressive.   Palliative Medicine has been consulted for pain control.   Subjective: Chart reviewed. Per MAR, in the past 24 hours patient has received 10 mg IV dilaudid  and   Update received from RN. She reports that patient continues to request IV dilaudid , states it doesn't really help, but immediately falls asleep after it is administered.   Last dose of IV dilaudid  was given today at 13:19.   Patient assessed at bedside. He is currently sleeping soundly, appears comfortable, and I did not attempt to wake him.   I later noted that IV dilaudid  has been discontinued by attending MD.    Objective:  Physical Exam Vitals reviewed.  Constitutional:      General: He is sleeping. He is not in acute distress.    Comments: Chronically ill-appearing  Pulmonary:     Effort: Pulmonary effort is normal.             Palliative Medicine Assessment & Plan   Assessment: Principal Problem:   Intractable pain Active Problems:   Malignant neoplasm of prostate (HCC)   Metastatic malignant neoplasm (HCC)   Diarrhea   Patient with acute  worsening of cancer-related pain, in the setting of widespread osseous metastasis with progression.   Recommendations/Plan: Continue full scope care Patient is well-known to outpatient palliative care clinic at [facility] center PMT will continue to follow while inpatient  Symptom Management:  Cancer-Related Pain: Continue dexamethasone  4 mg IV x 3 days Continue OxyContin  15 mg every 12 hours For breakthrough pain: oxycodone  10 to 15 mg every 4 hours as needed IV dilaudid  has been discontinued   Diarrhea: Normally has OIC managed with home regimen of MiraLAX  daily and senna 1 tablet twice  daily as needed Hold home regimen until diarrhea resolves   Other Symptoms: Ondansetron  4 mg every 6 hours as needed for nausea Continue Marinol  twice daily for appetite  Primary Decision Maker: PATIENT   Existing Vynca/ACP Documentation: None   Code Status/Advance Care Planning: Full code  Prognosis:  Unable to determine  Discharge Planning: To Be Determined   Thank you for allowing the Palliative Medicine Team to assist in the care of this patient.   MDM - moderate   Recardo KATHEE Loll, NP   Please contact Palliative Medicine Team phone at [phone] for questions and concerns.  For individual providers, please see AMION.

## 2023-11-10 NOTE — Progress Notes (Signed)
  Progress Note   Patient: Troy Mclaughlin FMW:[id number] DOB: December 10, 1955 DOA: 11/08/2023     1 DOS: the patient was seen and examined on 11/10/2023   Brief hospital course: The patient is a 68 yr old man with metastatic prostate CA who was admitted to [facility] on 11/08/2023 with complaints of uncontrolled cancer pain. Initially he was placed on IV Dilaudid  in addition to his usual OxyContin  and Oxy IR. Yesterday the dilaudid  dose was increased to 2mg  every three hours from 1. Today the patient states that he cannot tell the difference,, even though nursing tells me that he falls asleep shortly following administration of IV Dilaudid .   IV Dilaudid  has been discontinued. The patient has been converted to a dose of oral dilaudid  that is a little higher in effect than the oxy IR he was receiving. Will monitor for effect.  I appreciate palliative care's assistance.  Assessment and Plan:  Metastatic prostate cancer  Intractable Cancer related pain -extensive met to the spine. Due to lack of response to IV Dilaudid  have converted the patient to oral dilaudid  at a level that is slightly higher than his previous dose of oral oxy IR. He is continued on OxyContin . Monitor for effect.  Severe and persistent hypokalemia -The patient will receive 80 mEq today. Monitor.   Mild hyponatremia  -continue to monitor   HLD -resume statin    HTN -continue carvedilol  /amlodpine    DM2  --diet controlled    hx of TIA -no active issue -not on secondary ppx      DVT prophylaxis: heparin  Code Status: full/ as discussed per patient wishes in event of cardiac arrest  Family Communication: none at bedside Disposition Plan:patient  expected to be admitted greater than 2 midnights Consults: Palliative care, Hematology/Oncology     Subjective: The patient states that the IV Dilaudid  is not helping him at all.  Physical Exam: Vitals:   11/09/23 1436 11/09/23 2054 11/10/23 0509 11/10/23 1603  BP: 132/76  120/69 137/83 (!) 152/87  Pulse: 61 64 (!) 59 (!) 55  Resp: 16 18 18  (!) 22  Temp: 97.6 F (36.4 C) 97.7 F (36.5 C) 97.8 F (36.6 C) (!) 97.4 F (36.3 C)  TempSrc:  Oral Oral Oral  SpO2: 97% 100% 99% 99%   Exam:  Constitutional:  The patient is awake, alert, and oriented x 3. No acute distress. He is cachectic. Respiratory:  No increased work of breathing. No wheezes, rales, or rhonchi No tactile fremitus Cardiovascular:  Regular rate and rhythm No murmurs, ectopy, or gallups. No lateral PMI. No thrills. Abdomen:  Abdomen is soft, non-tender, non-distended Scaffoid No hernias, masses, or organomegaly Normoactive bowel sounds.  Musculoskeletal:  No cyanosis, clubbing, or edema Skin:  No rashes, lesions, ulcers palpation of skin: no induration or nodules Neurologic:  CN 2-12 intact Sensation all 4 extremities intact Psychiatric:  Mental status Mood, affect appropriate Orientation to person, place, time  judgment and insight appear intact  Family Communication: None available.  Disposition: Status is: Inpatient Remains inpatient appropriate because: need to adjust high dose narcotics in a safe and monitored setting.  Planned Discharge Destination: Home    Time spent: 34 minutes  Author: Lakya Schrupp, DO 11/10/2023 7:17 PM  For on call review [email or web address].

## 2023-11-10 NOTE — Progress Notes (Signed)
   11/10/23 1321  TOC Brief Assessment  Insurance and Status Reviewed  Patient has primary care physician Yes  Home environment has been reviewed Home w/ spouse  Prior level of function: Independent  Prior/Current Home Services No current home services  Social Drivers of Health Review SDOH reviewed no interventions necessary  Readmission risk has been reviewed Yes  Transition of care needs transition of care needs identified, TOC will continue to follow

## 2023-11-10 NOTE — Discharge Instructions (Signed)
 FOOD PANTRY [employer] [address] [phone]  [facility] [employer] [address] [place] [phone]  [facility] [address] [place] [phone]  [facility] [address] [phone]  [facility] [address] [place], [place] [address] [phone]   [facility] [address] [place], [place] [address] [phone]  [employer] [place] I-Ride Reservations Line: [phone]     Passengers can simply call the [employer] reservations number at [phone] for pickup. However, with [employer] same-day service is available with at least two hour notice Monday through Friday. You will need your [employer] [place] client ID# when you call for reservations. If you do not know it, you can call [phone] to request it. [employer] offers a flat fare of $8.50 per trip. This will cover travel anywhere within the city limits of [place].

## 2023-11-10 NOTE — Plan of Care (Signed)

## 2023-11-10 NOTE — Plan of Care (Signed)

## 2023-11-11 DIAGNOSIS — R52 Pain, unspecified: Secondary | ICD-10-CM | POA: Diagnosis not present

## 2023-11-11 LAB — GLUCOSE, CAPILLARY
Glucose-Capillary: 117 mg/dL — ABNORMAL HIGH (ref 70–99)
Glucose-Capillary: 118 mg/dL — ABNORMAL HIGH (ref 70–99)
Glucose-Capillary: 126 mg/dL — ABNORMAL HIGH (ref 70–99)
Glucose-Capillary: 127 mg/dL — ABNORMAL HIGH (ref 70–99)

## 2023-11-11 LAB — CBC WITH DIFFERENTIAL/PLATELET
Abs Immature Granulocytes: 0.07 10*3/uL (ref 0.00–0.07)
Basophils Absolute: 0 10*3/uL (ref 0.0–0.1)
Basophils Relative: 0 %
Eosinophils Absolute: 0 10*3/uL (ref 0.0–0.5)
Eosinophils Relative: 0 %
HCT: 26.1 % — ABNORMAL LOW (ref 39.0–52.0)
Hemoglobin: 8.5 g/dL — ABNORMAL LOW (ref 13.0–17.0)
Immature Granulocytes: 1 %
Lymphocytes Relative: 3 %
Lymphs Abs: 0.3 10*3/uL — ABNORMAL LOW (ref 0.7–4.0)
MCH: 24 pg — ABNORMAL LOW (ref 26.0–34.0)
MCHC: 32.6 g/dL (ref 30.0–36.0)
MCV: 73.7 fL — ABNORMAL LOW (ref 80.0–100.0)
Monocytes Absolute: 0.6 10*3/uL (ref 0.1–1.0)
Monocytes Relative: 7 %
Neutro Abs: 7.2 10*3/uL (ref 1.7–7.7)
Neutrophils Relative %: 89 %
Platelets: 216 10*3/uL (ref 150–400)
RBC: 3.54 MIL/uL — ABNORMAL LOW (ref 4.22–5.81)
RDW: 16 % — ABNORMAL HIGH (ref 11.5–15.5)
WBC: 8.1 10*3/uL (ref 4.0–10.5)
nRBC: 0 % (ref 0.0–0.2)

## 2023-11-11 LAB — BASIC METABOLIC PANEL WITH GFR
Anion gap: 12 (ref 5–15)
BUN: 13 mg/dL (ref 8–23)
CO2: 25 mmol/L (ref 22–32)
Calcium: 8.9 mg/dL (ref 8.9–10.3)
Chloride: 99 mmol/L (ref 98–111)
Creatinine, Ser: 0.79 mg/dL (ref 0.61–1.24)
GFR, Estimated: >60 mL/min (ref >60)
Glucose, Bld: 122 mg/dL — ABNORMAL HIGH (ref 70–99)
Potassium: 3.8 mmol/L (ref 3.5–5.1)
Sodium: 136 mmol/L (ref 135–145)

## 2023-11-11 MED ORDER — HYDROMORPHONE HCL 2 MG PO TABS
5.0000 mg | ORAL_TABLET | ORAL | Status: DC | PRN
  Administered 2023-11-11 – 2023-11-12 (×3): 5 mg via ORAL
  Filled 2023-11-11 (×3): qty 3

## 2023-11-11 MED ORDER — ALUM & MAG HYDROXIDE-SIMETH 200-200-20 MG/5ML PO SUSP
30.0000 mL | ORAL | Status: DC | PRN
  Administered 2023-11-11: 30 mL via ORAL
  Filled 2023-11-11: qty 30

## 2023-11-11 NOTE — Plan of Care (Signed)
  Problem: Clinical Measurements: Goal: Will remain free from infection Outcome: Progressing Goal: Diagnostic test results will improve Outcome: Progressing   Problem: Coping: Goal: Level of anxiety will decrease Outcome: Progressing   Problem: Elimination: Goal: Will not experience complications related to urinary retention Outcome: Progressing   Problem: Skin Integrity: Goal: Risk for impaired skin integrity will decrease Outcome: Progressing   Problem: Fluid Volume: Goal: Ability to maintain a balanced intake and output will improve Outcome: Progressing   Problem: Nutritional: Goal: Maintenance of adequate nutrition will improve Outcome: Progressing Goal: Progress toward achieving an optimal weight will improve Outcome: Progressing   Problem: Tissue Perfusion: Goal: Adequacy of tissue perfusion will improve Outcome: Progressing

## 2023-11-11 NOTE — Plan of Care (Addendum)
 VSS. BG 121 at bedtime. Patient given PRN Dilaudid  for pain. No acute events overnight.  Problem: Education: Goal: Knowledge of General Education information will improve Description: Including pain rating scale, medication(s)/side effects and non-pharmacologic comfort measures Outcome: Progressing   Problem: Health Behavior/Discharge Planning: Goal: Ability to manage health-related needs will improve Outcome: Progressing   Problem: Clinical Measurements: Goal: Ability to maintain clinical measurements within normal limits will improve Outcome: Progressing Goal: Will remain free from infection Outcome: Progressing   Problem: Pain Managment: Goal: General experience of comfort will improve and/or be controlled Outcome: Progressing   Problem: Fluid Volume: Goal: Ability to maintain a balanced intake and output will improve Outcome: Progressing   Problem: Metabolic: Goal: Ability to maintain appropriate glucose levels will improve Outcome: Progressing   Problem: Nutritional: Goal: Maintenance of adequate nutrition will improve Outcome: Progressing

## 2023-11-11 NOTE — Progress Notes (Signed)
  Progress Note   Patient: Troy Mclaughlin FMW:[id number] DOB: 1955/09/15 DOA: 11/08/2023     2 DOS: the patient was seen and examined on 11/11/2023   Brief hospital course: The patient is a 67 yr old man with metastatic prostate CA who was admitted to [facility] on 11/08/2023 with complaints of uncontrolled cancer pain. Initially he was placed on IV Dilaudid  in addition to his usual OxyContin  and Oxy IR. Yesterday the dilaudid  dose was increased to 2mg  every three hours from 1. Today the patient states that he cannot tell the difference,, even though nursing tells me that he falls asleep shortly following administration of IV Dilaudid .   IV Dilaudid  has been discontinued. The patient has been converted to a dose of oral dilaudid  that is a little higher in effect than the oxy IR he was receiving.   On 11/11/2023 the patient states that his pain is improved, but still about a 7. Will increase dose of oral dilaudid .  I appreciate palliative care's assistance.  Assessment and Plan:  Metastatic prostate cancer  Intractable Cancer related pain -extensive met to the spine. Will increase oral dilaudid  to 5 mg q 4 hours. He is continued on OxyContin . Monitor for effect.  Severe and persistent hypokalemia -The patient will receive 80 mEq today. Monitor.   Mild hyponatremia  -resolved. -continue to monitor.  HLD -resume statin    HTN -continue carvedilol  /amlodpine    DM2  --diet controlled    hx of TIA -no active issue -not on secondary ppx      DVT prophylaxis: heparin  Code Status: full/ as discussed per patient wishes in event of cardiac arrest  Family Communication: none at bedside Disposition Plan:patient  expected to be admitted greater than 2 midnights Consults: Palliative care, Hematology/Oncology     Subjective: The patient states that his pain is improved to about a 7 now.  Physical Exam: Vitals:   11/10/23 0509 11/10/23 1603 11/10/23 2049 11/11/23 0519  BP: 137/83 (!) 152/87  133/80 (!) 153/70  Pulse: (!) 59 (!) 55 (!) 57 (!) 58  Resp: 18 (!) 22 18 18   Temp: 97.8 F (36.6 C) (!) 97.4 F (36.3 C) 97.9 F (36.6 C) 98.1 F (36.7 C)  TempSrc: Oral Oral Oral Oral  SpO2: 99% 99% 100% 100%   Exam:  Constitutional:  The patient is awake, alert, and oriented x 3. No acute distress. He is cachectic. Respiratory:  No increased work of breathing. No wheezes, rales, or rhonchi No tactile fremitus Cardiovascular:  Regular rate and rhythm No murmurs, ectopy, or gallups. No lateral PMI. No thrills. Abdomen:  Abdomen is soft, non-tender, non-distended Scaffoid No hernias, masses, or organomegaly Normoactive bowel sounds.  Musculoskeletal:  No cyanosis, clubbing, or edema Skin:  No rashes, lesions, ulcers palpation of skin: no induration or nodules Neurologic:  CN 2-12 intact Sensation all 4 extremities intact Psychiatric:  Mental status Mood, affect appropriate Orientation to person, place, time  judgment and insight appear intact  Family Communication: None available.  Disposition: Status is: Inpatient Remains inpatient appropriate because: need to adjust high dose narcotics in a safe and monitored setting.  Planned Discharge Destination: Home    Time spent: 32 minutes  Author: Jumar Greenstreet, DO 11/11/2023 4:59 PM  For on call review [email or web address].

## 2023-11-11 NOTE — Progress Notes (Signed)
                                                                                                                                                                                                          Palliative Medicine Progress Note   Patient Name: Troy Mclaughlin       Date: 11/11/2023 DOB: 1955-06-11  Age: 68 y.o. MRN#: [id number] Attending Physician: Soledad Ligas, DO Primary Care Physician: Oley Bascom RAMAN, NP Admit Date: 11/08/2023  Reason for Consultation/Follow-up: {Reason for Consult:23484}  HPI/Patient Profile: 68 y.o. male  with past medical history of metastatic prostate cancer on darolutamide , diabetes type 2, GERD, history of TIA, HLD, and HTN who presented to the ED on 11/08/2023 and is admitted with refractory cancer related pain and nausea.  CT abdomen/pelvis showed widespread osseous metastasis throughout the axial and appendicular skeleton, progressive.   Palliative Medicine has been consulted for pain control.   Subjective: Chart reviewed. Per MAR,   Objective:  Physical Exam Vitals reviewed.  Constitutional:      General: He is not in acute distress.    Comments: Chronically ill-appearing  Pulmonary:     Effort: Pulmonary effort is normal.   Neurological:     Mental Status: He is alert and oriented to person, place, and time.   Psychiatric:        Behavior: Behavior normal.             LBM: Last BM Date : 11/10/23    Palliative Medicine Assessment & Plan   Assessment: Principal Problem:   Intractable pain Active Problems:   Malignant neoplasm of prostate (HCC)   Metastatic malignant neoplasm (HCC)   Diarrhea    Recommendations/Plan: ***  Goals of Care and Additional Recommendations: Limitations on Scope of Treatment: {Recommended Scope and Preferences:21019}  Code Status:   Prognosis:  {Palliative Care Prognosis:23504}  Discharge Planning: {Palliative dispostion:23505}  Care plan was discussed with ***  Thank you for allowing the  Palliative Medicine Team to assist in the care of this patient.   ***   Recardo KATHEE Loll, NP   Please contact Palliative Medicine Team phone at [phone] for questions and concerns.  For individual providers, please see AMION.

## 2023-11-12 ENCOUNTER — Other Ambulatory Visit: Payer: Self-pay

## 2023-11-12 DIAGNOSIS — R52 Pain, unspecified: Secondary | ICD-10-CM | POA: Diagnosis not present

## 2023-11-12 DIAGNOSIS — C61 Malignant neoplasm of prostate: Secondary | ICD-10-CM

## 2023-11-12 LAB — CBC WITH DIFFERENTIAL/PLATELET
Abs Immature Granulocytes: 0.05 10*3/uL (ref 0.00–0.07)
Basophils Absolute: 0 10*3/uL (ref 0.0–0.1)
Basophils Relative: 0 %
Eosinophils Absolute: 0 10*3/uL (ref 0.0–0.5)
Eosinophils Relative: 0 %
HCT: 26.9 % — ABNORMAL LOW (ref 39.0–52.0)
Hemoglobin: 8.9 g/dL — ABNORMAL LOW (ref 13.0–17.0)
Immature Granulocytes: 1 %
Lymphocytes Relative: 4 %
Lymphs Abs: 0.3 10*3/uL — ABNORMAL LOW (ref 0.7–4.0)
MCH: 24.1 pg — ABNORMAL LOW (ref 26.0–34.0)
MCHC: 33.1 g/dL (ref 30.0–36.0)
MCV: 72.7 fL — ABNORMAL LOW (ref 80.0–100.0)
Monocytes Absolute: 0.5 10*3/uL (ref 0.1–1.0)
Monocytes Relative: 8 %
Neutro Abs: 5.7 10*3/uL (ref 1.7–7.7)
Neutrophils Relative %: 87 %
Platelets: 242 10*3/uL (ref 150–400)
RBC: 3.7 MIL/uL — ABNORMAL LOW (ref 4.22–5.81)
RDW: 15.9 % — ABNORMAL HIGH (ref 11.5–15.5)
WBC: 6.5 10*3/uL (ref 4.0–10.5)
nRBC: 0 % (ref 0.0–0.2)

## 2023-11-12 LAB — BASIC METABOLIC PANEL WITH GFR
Anion gap: 8 (ref 5–15)
BUN: 13 mg/dL (ref 8–23)
CO2: 27 mmol/L (ref 22–32)
Calcium: 8.6 mg/dL — ABNORMAL LOW (ref 8.9–10.3)
Chloride: 98 mmol/L (ref 98–111)
Creatinine, Ser: 0.7 mg/dL (ref 0.61–1.24)
GFR, Estimated: >60 mL/min (ref >60)
Glucose, Bld: 109 mg/dL — ABNORMAL HIGH (ref 70–99)
Potassium: 3.7 mmol/L (ref 3.5–5.1)
Sodium: 133 mmol/L — ABNORMAL LOW (ref 135–145)

## 2023-11-12 LAB — GLUCOSE, CAPILLARY
Glucose-Capillary: 112 mg/dL — ABNORMAL HIGH (ref 70–99)
Glucose-Capillary: 100 mg/dL — ABNORMAL HIGH (ref 70–99)

## 2023-11-12 MED ORDER — MORPHINE SULFATE ER 30 MG PO TBCR
30.0000 mg | EXTENDED_RELEASE_TABLET | Freq: Two times a day (BID) | ORAL | 0 refills | Status: DC
Start: 2023-11-12 — End: 2023-11-27

## 2023-11-12 MED ORDER — HYDROMORPHONE HCL 2 MG PO TABS: 5.0000 mg | ORAL_TABLET | ORAL | 0 refills | Status: DC | PRN

## 2023-11-12 NOTE — Discharge Summary (Signed)
 Physician Discharge Summary   Patient: Troy Mclaughlin MRN: [id number] DOB: 08/26/1955  Admit date:     11/08/2023  Discharge date: 11/12/23  Discharge Physician: Brigida Bureau   PCP: Oley Bascom RAMAN, NP   Recommendations at discharge:    Discharge to home Follow up with PCP in 7-10 days Keep appointment with oncology. STOP taking previous pain medications. Take only new prescriptions for pain as prescribed.  Discharge Diagnoses: Principal Problem:   Intractable pain Active Problems:   Malignant neoplasm of prostate (HCC)   Metastatic malignant neoplasm (HCC)   Diarrhea  Resolved Problems:   * No resolved hospital problems. [facility] Course:  The patient is a 68 yr old man with metastatic prostate CA who was admitted to [facility] on 11/08/2023 with complaints of uncontrolled cancer pain. Initially he was placed on IV Dilaudid  in addition to his usual OxyContin  and Oxy IR. Yesterday the dilaudid  dose was increased to 2mg  every three hours from 1. Today the patient states that he cannot tell the difference,, even though nursing tells me that he falls asleep shortly following administration of IV Dilaudid .    IV Dilaudid  has been discontinued. The patient has been converted to a dose of oral dilaudid  that is a little higher in effect than the oxy IR he was receiving.    On 11/11/2023 the patient states that his pain is improved, but still about a 7. Will increase dose of oral dilaudid .   On 11/12/2023 the patient states that his pain is controlled on the new regimen. He will be discharged to home in fair condition.  DVT prophylaxis: heparin  Code Status: full/ as discussed per patient wishes in event of cardiac arrest   Assessment and Plan: Metastatic prostate cancer  Intractable Cancer related pain -extensive met to the spine. Will increase oral dilaudid  to 5 mg q 4 hours. He is continued on OxyContin . The patient states that his pain is improved today. He will be discharged to home  today.   Severe and persistent hypokalemia -The patient will receive 80 mEq today. Monitor.   Mild hyponatremia  -resolved. -continue to monitor.   HLD -resume statin    HTN -continue carvedilol  /amlodpine    DM2  --diet controlled    hx of TIA -no active issue -not on secondary ppx      DVT prophylaxis: heparin  Code Status: full/ as discussed per patient wishes in event of cardiac arrest         Consultants: Palliative care Procedures performed: None  Disposition: Home Diet recommendation:  Discharge Diet Orders (From admission, onward)     Start     Ordered   11/12/23 0000  Diet - low sodium heart healthy        11/12/23 1251           Regular diet DISCHARGE MEDICATION: Allergies as of 11/12/2023       Reactions   Lisinopril  Anaphylaxis, Swelling   angioedema   Metformin  And Related Other (See Comments)   unknown        Medication List     STOP taking these medications    benzonatate  100 MG capsule Commonly known as: TESSALON    cetirizine  10 MG tablet Commonly known as: ZYRTEC    Oxycodone  HCl 10 MG Tabs   Xtampza  ER 9 MG C12a Generic drug: oxyCODONE  ER       TAKE these medications    acetaminophen  500 MG tablet Commonly known as: TYLENOL  Take 2 tablets (1,000 mg total)  by mouth every 6 (six) hours as needed for moderate pain or mild pain.   amLODipine  10 MG tablet Commonly known as: NORVASC  Take 1 tablet (10 mg total) by mouth every morning.   atorvastatin  10 MG tablet Commonly known as: LIPITOR Take 1 tablet (10 mg total) by mouth every morning.   B-12 1000 MCG Tabs Take 1 tablet (1,000 mcg total) by mouth daily.   carvedilol  12.5 MG tablet Commonly known as: COREG  Take 1 tablet (12.5 mg total) by mouth 2 (two) times daily with a meal.   dronabinol  10 MG capsule Commonly known as: Marinol  Take 1 capsule (10 mg total) by mouth 2 (two) times daily before a meal.   HYDROmorphone  2 MG tablet Commonly known as:  Dilaudid  Take 2.5 tablets (5 mg total) by mouth every 4 (four) hours as needed for up to 15 days for severe pain (pain score 7-10).   morphine  30 MG 12 hr tablet Commonly known as: MS CONTIN  Take 1 tablet (30 mg total) by mouth every 12 (twelve) hours for 15 days.   naloxegol  oxalate 12.5 MG Tabs tablet Commonly known as: MOVANTIK  Take 1 tablet (12.5 mg total) by mouth daily.   nicotine  14 mg/24hr patch Commonly known as: NICODERM CQ  - dosed in mg/24 hours Place 1 patch (14 mg total) onto the skin daily.   nitroGLYCERIN  0.4 MG SL tablet Commonly known as: NITROSTAT  Place 1 tablet under the tongue every 15 minutes up to 3 doses within 15 minutes as needed for chest pain   Nubeqa  300 MG tablet Generic drug: darolutamide  Take 2 tablets (600 mg total) by mouth 2 (two) times daily with a meal.   ondansetron  4 MG tablet Commonly known as: ZOFRAN  Take 1 tablet (4 mg total) by mouth every 8 (eight) hours as needed for nausea   polyethylene glycol powder 17 GM/SCOOP powder Commonly known as: MiraLax  Take 17 grams dissolved in liquid by mouth daily. What changed:  when to take this reasons to take this   sildenafil  100 MG tablet Commonly known as: VIAGRA  Take 1 tablet by mouth 30 minutes before sexual activity as needed for erectile dysfunction   Stool Softener/Laxative 50-8.6 MG tablet Generic drug: senna-docusate Take 1 tablet by mouth 2 (two) times daily. What changed:  when to take this reasons to take this   traZODone  50 MG tablet Commonly known as: DESYREL  Take 1 tablet (50 mg total) by mouth at bedtime. What changed:  when to take this reasons to take this   Ventolin  HFA 108 (90 Base) MCG/ACT inhaler Generic drug: albuterol  Inhale 1-2 puffs into the lungs every 6 (six) hours as needed for wheezing or shortness of breath.        Discharge Exam: There were no vitals filed for this visit. Exam:  Constitutional:  The patient is awake, alert, and oriented x 3.  No acute distress. Respiratory:  No increased work of breathing. No wheezes, rales, or rhonchi No tactile fremitus Cardiovascular:  Regular rate and rhythm No murmurs, ectopy, or gallups. No lateral PMI. No thrills. Abdomen:  Abdomen is soft, non-tender, non-distended No hernias, masses, or organomegaly Normoactive bowel sounds.  Musculoskeletal:  No cyanosis, clubbing, or edema Skin:  No rashes, lesions, ulcers palpation of skin: no induration or nodules Neurologic:  CN 2-12 intact Sensation all 4 extremities intact Psychiatric:  Mental status Mood, affect appropriate Orientation to person, place, time  judgment and insight appear intact   Condition at discharge: fair  The results of significant  diagnostics from this hospitalization (including imaging, microbiology, ancillary and laboratory) are listed below for reference.   Imaging Studies: MR LUMBAR SPINE WO CONTRAST Result Date: 11/09/2023 CLINICAL DATA:  68 year old male with metastatic prostate cancer. EXAM: MRI LUMBAR SPINE WITHOUT CONTRAST TECHNIQUE: Multiplanar, multisequence MR imaging of the lumbar spine was performed. No intravenous contrast was administered. COMPARISON:  Thoracic MRI today. CT Abdomen and Pelvis, CT lumbar spine 0101 hours today. FINDINGS: Segmentation:  Normal, concordant with the thoracic numbering today. Alignment:  Stable lumbar lordosis. Vertebrae: Diffuse lower thoracic and lumbar vertebral heterogeneous metastatic disease. Superimposed evidence of previous L5 through pelvic radiation with background intrinsic increased T1 signal, but multifocal superimposed L5, sacral, pelvic hypointense bone metastases also (series 10, image 37). Maintained lumbar vertebral height. Patchy marrow edema associated in the L1 through L4 vertebrae, including the right L4 pedicle. No pathologic fracture identified. No convincing lumbar extraosseous tumor. Conus medullaris and cauda equina: Conus extends to the T12-L1  level. No lower spinal cord or conus signal abnormality. Mild motion artifact but grossly normal noncontrast cauda quinine nerve roots. Paraspinal and other soft tissues: Stable to CT Abdomen and Pelvis today. Disc levels: Mostly age-appropriate lumbar spine degeneration is superimposed. No significant lumbar spinal stenosis. IMPRESSION: 1. Diffuse lumbosacral and visible pelvic bone metastases, superimposed on previous L5, sacral and pelvic radiation treatment. No pathologic fracture or extraosseous tumor identified in the lumbar spine. 2. Lumbar spine degeneration with no significant lumbar spinal stenosis. Electronically Signed   By: VEAR Hurst M.D.   On: 11/09/2023 10:40   MR THORACIC SPINE WO CONTRAST Result Date: 11/09/2023 CLINICAL DATA:  68 year old male with metastatic prostate cancer. EXAM: MRI THORACIC SPINE WITHOUT CONTRAST TECHNIQUE: Multiplanar, multisequence MR imaging of the thoracic spine was performed. No intravenous contrast was administered. COMPARISON:  Chest CTA 0101 hours today. CT cervical spine 03/15/2021. FINDINGS: Limited cervical spine imaging: Limited, but heterogeneous marrow signal suggesting cervical vertebral metastases. Thoracic spine segmentation:  Normal on the CTA this morning. Alignment: Stable thoracic kyphosis. No significant scoliosis or spondylolisthesis. Vertebrae: Diffuse thoracic vertebral, posterior rib bone metastases, much of which is sclerotic by CT this morning. Patchy increased STIR hyperintensity at some of the levels, especially T7 and T9 (series 19, image 9) suggesting active tumor. Maintained vertebral height. No thoracic pathologic fracture identified. Cord: Within normal limits. No evidence of cord edema or expansion. Partially visible conus medullaris at T12-L1. Paraspinal and other soft tissues: Abnormal chest stable from CTA this morning. No paraspinal extraosseous tumor identified. Disc levels: Generally age-appropriate thoracic spine degeneration. Disc  degeneration most pronounced at T8-T9 (moderate size central disc protrusion series 20, image 26) without significant spinal stenosis. No epidural or extraosseous tumor extension identified in the thoracic spine. IMPRESSION: 1. Diffuse thoracic spinal vertebral metastases with no pathologic fracture or extraosseous tumor extension identified. 2. Superimposed thoracic spine degeneration. No thoracic spinal stenosis. Electronically Signed   By: VEAR Hurst M.D.   On: 11/09/2023 10:25   CT L-SPINE NO CHARGE Result Date: 11/09/2023 EXAM: CT OF THE LUMBAR SPINE WITHOUT CONTRAST 11/09/2023 01:12:47 AM TECHNIQUE: CT of the lumbar spine was performed without the administration of intravenous contrast. Multiplanar reformatted images are provided for review. Automated exposure control, iterative reconstruction, and/or weight based adjustment of the mA/kV was utilized to reduce the radiation dose to as low as reasonably achievable. COMPARISON: None available. CLINICAL HISTORY: Left and right sided pain around his midsection for three days. Patient also complains of chest tightness and nausea/vomiting. Patient stated there was a  lot of phlegm. History of cancer that's started in his back and is now in his stomach. FINDINGS: BONES AND ALIGNMENT: Widespread osseous metastases throughout the lumbar spine, sacrum, and bilateral pelvis, progressive. For example, a 17 mm sclerotic metastasis in the sacrum (image 82) previously measured 11 mm. No evidence of pathologic fracture. DEGENERATIVE CHANGES: Mild degenerative changes at the lower thoracic / upper lumbar spine. SOFT TISSUES: Evaluated on CT abdomen/pelvis. IMPRESSION: 1. Widespread osseous metastases, progressive. No evidence of pathologic fracture. 2. Mild degenerative changes. Electronically signed by: Pinkie Pebbles MD 11/09/2023 01:34 AM EDT RP Workstation: [id number]   CT ABDOMEN PELVIS W CONTRAST Result Date: 11/09/2023 EXAM: CT ABDOMEN AND PELVIS WITH CONTRAST  11/09/2023 01:12:47 AM TECHNIQUE: CT of the abdomen and pelvis was performed with the administration of intravenous contrast (100mL iohexol  (OMNIPAQUE ) 350 MG/ML injection). Multiplanar reformatted images are provided for review. Automated exposure control, iterative reconstruction, and/or weight based adjustment of the mA/kV was utilized to reduce the radiation dose to as low as reasonably achievable. COMPARISON: 07/16/2023 CLINICAL HISTORY: Abdominal pain, acute, nonlocalized; left and right sided pain around midsection for three days; chest tightness and N/V; history of cancer. FINDINGS: LOWER CHEST: Evaluated on dedicated CT chest. LIVER: 2.7 cm hemangioma in the posterior right hepatic lobe (image 149). Scattered subcentimeter hepatic cysts. GALLBLADDER AND BILE DUCTS: Gallbladder is unremarkable. No biliary ductal dilatation. SPLEEN: No acute abnormality. PANCREAS: No acute abnormality. ADRENAL GLANDS: No acute abnormality. KIDNEYS, URETERS AND BLADDER: 5 mm interpolar left renal calculus (image 219). No hydronephrosis. No perinephric or periureteral stranding. Thick walled bladder, suggesting radiation changes. GI AND BOWEL: Status post gastrojejunostomy, patent. Normal appendix (image 478). Mild left colonic diverticulosis, without evidence of diverticulitis. No bowel obstruction. No bowel wall thickening. PERITONEUM AND RETROPERITONEUM: No ascites. No free air. VASCULATURE: Aorta is normal in caliber. LYMPH NODES: No lymphadenopathy. REPRODUCTIVE ORGANS: Fiducial markers in the prostate. BONES AND SOFT TISSUES: Widespread osseous metastasis throughout the visualized axial and appendicular skeleton, progressive. No acute osseous abnormality. No focal soft tissue abnormality. IMPRESSION: 1. No acute findings in the abdomen or pelvis. 2. Fiducial markers in the prostate. 3. Widespread osseous metastasis throughout the visualized axial and appendicular skeleton, progressive. 4. 5 mm interpolar left renal  calculus. No hydronephrosis. Electronically signed by: Pinkie Pebbles MD 11/09/2023 01:32 AM EDT RP Workstation: [id number]   CT Angio Chest PE W and/or Wo Contrast Result Date: 11/09/2023 EXAM: CTA of the Chest with contrast for PE 11/09/2023 01:12:47 AM TECHNIQUE: CTA of the chest was performed after the administration of intravenous contrast. Multiplanar reformatted images are provided for review. MIP images are provided for review. Automated exposure control, iterative reconstruction, and/or weight based adjustment of the mA/kV was utilized to reduce the radiation dose to as low as reasonably achievable. COMPARISON: 07/16/2023. CLINICAL HISTORY: Pulmonary embolism (PE) suspected, low to intermediate prob, positive D-dimer. Left and right sided pain around his midsection for three days. Pt also c/o chest tightness and N/V. Pt stated there was a lot of phlegm. Hx of cancer that's started in his back and is now in his stomach. FINDINGS: PULMONARY ARTERIES: Pulmonary arteries are adequately opacified for evaluation. No evidence of pulmonary embolism. Main pulmonary artery is normal in caliber. MEDIASTINUM: The heart and pericardium demonstrate no acute abnormality. There is no acute abnormality of the thoracic aorta. Mild 3-vessel coronary artery disease. Leftward cardiomediastinal shift. LYMPH NODES: No mediastinal, hilar or axillary lymphadenopathy. LUNGS AND PLEURA: Postinfectious inflammatory scarring/atelectasis with bronchiectasis in the left upper and lower lobes,  with associated volume loss. No residual pneumonia. Mild centrilobular emphysematous changes, upper lung predominant. No pleural effusion or pneumothorax. UPPER ABDOMEN: Evaluated on dedicated CT abdomen/pelvis. SOFT TISSUES AND BONES: Widespread sclerotic osseous metastases throughout the visualized axial and appendicular skeleton, favored to be mildly progressive. Old / healed midsternal fracture. IMPRESSION: 1. No evidence of pulmonary  embolism. 2. Postinfectious inflammatory scarring/atelectasis with volume loss in the left hemithorax. No residual pneumonia. 3. Widespread sclerotic osseous metastases throughout the visualized axial and appendicular skeleton, mildly progressive. Electronically signed by: Pinkie Pebbles MD 11/09/2023 01:27 AM EDT RP Workstation: [id number]   DG Chest Portable 1 View Result Date: 11/09/2023 EXAM: 1 VIEW XRAY OF THE CHEST 11/09/2023 12:10:00 AM COMPARISON: 07/25/2023 CLINICAL HISTORY: cough, SOB. c/o left and right sided pain around his midsection for three days. Pt also c/o chest tightness and N/V. Pt stated there was a lot of phlegm. FINDINGS: LUNGS AND PLEURA: Mild residual left upper lobe opacity with volume loss, with additional plate-like left lower lung opacity, favoring postinfectious or inflammatory scarring. No pleural effusion. No pneumothorax. HEART AND MEDIASTINUM: Leftward cardiomediastinal shift. BONES AND SOFT TISSUES: Old right posterolateral rib fracture deformities. Surgical clips in the left upper abdomen. No acute osseous abnormality. IMPRESSION: 1. Mild residual left lung opacities, favoring postinfectious or inflammatory scarring. Electronically signed by: Pinkie Pebbles MD 11/09/2023 12:24 AM EDT RP Workstation: [id number]    Microbiology: Results for orders placed or performed during the hospital encounter of 11/08/23  Blood culture (routine x 2)     Status: None (Preliminary result)   Collection Time: 11/09/23 12:18 AM   Specimen: BLOOD  Result Value Ref Range Status   Specimen Description   Final    BLOOD RIGHT ANTECUBITAL Performed at [facility], [address] [place], [place] [address]    Special Requests   Final    BOTTLES DRAWN AEROBIC AND ANAEROBIC Blood Culture results may not be optimal due to an inadequate volume of blood received in culture bottles Performed at [facility], [address], [place], [place] [address]     Culture   Final    NO GROWTH 3 DAYS Performed at [facility], [address], [place], [place] [address]    Report Status PENDING  Incomplete  Blood culture (routine x 2)     Status: None (Preliminary result)   Collection Time: 11/09/23 12:26 AM   Specimen: BLOOD  Result Value Ref Range Status   Specimen Description   Final    BLOOD LEFT ANTECUBITAL Performed at [facility], [address], [place], [place] [address]    Special Requests   Final    BOTTLES DRAWN AEROBIC AND ANAEROBIC Blood Culture adequate volume Performed at [facility], [address], [place], [place] [address]    Culture   Final    NO GROWTH 3 DAYS Performed at [facility], [address] [place], [place] [address]    Report Status PENDING  Incomplete   *Note: Due to a large number of results and/or encounters for the requested time period, some results have not been displayed. A complete set of results can be found in Results Review.    Labs: CBC: Recent Labs  Lab 11/08/23 2328 11/09/23 0444 11/10/23 0513 11/11/23 0626 11/12/23 0628  WBC 7.1 5.9 6.6 8.1 6.5  NEUTROABS  --   --  5.9 7.2 5.7  HGB 9.6* 8.5* 8.9* 8.5* 8.9*  HCT 30.1* 26.9* 27.9* 26.1*  26.9*  MCV 74.9* 75.4* 76.4* 73.7* 72.7*  PLT 277 208 232 216 242   Basic Metabolic Panel: Recent Labs  Lab 11/08/23 2328 11/09/23 0444 11/10/23 0513 11/11/23 0626 11/12/23 0628  NA 130* 131* 133* 136 133*  K 3.1* 2.9* 3.9 3.8 3.7  CL 95* 97* 99 99 98  CO2 24 23 24 25 27   GLUCOSE 107* 105* 138* 122* 109*  BUN 16 14 13 13 13   CREATININE 0.86 0.77 0.72 0.79 0.70  CALCIUM  8.2* 8.4* 8.6* 8.9 8.6*   Liver Function Tests: Recent Labs  Lab 11/08/23 2328 11/09/23 0444  AST 18 13*  ALT 8 8  ALKPHOS 520* 446*  BILITOT 0.5 0.3  PROT 6.9 6.4*  ALBUMIN 3.2* 2.9*   CBG: Recent Labs  Lab 11/11/23 1228 11/11/23 1741 11/11/23 2111 11/12/23 0743 11/12/23 1127  GLUCAP 118* 126* 127*  112* 100*    Discharge time spent: greater than 30 minutes.  Signed: Brigida Bureau, DO [facility] 11/12/2023

## 2023-11-12 NOTE — Plan of Care (Signed)

## 2023-11-13 ENCOUNTER — Other Ambulatory Visit (HOSPITAL_COMMUNITY): Payer: Self-pay

## 2023-11-13 ENCOUNTER — Telehealth: Payer: Self-pay

## 2023-11-13 ENCOUNTER — Other Ambulatory Visit: Payer: Self-pay

## 2023-11-13 NOTE — Telephone Encounter (Signed)
 Pt called, LVM asking for a call back. RN attempted to call, no answer, LVM

## 2023-11-14 ENCOUNTER — Telehealth: Payer: Self-pay

## 2023-11-14 ENCOUNTER — Other Ambulatory Visit: Payer: Self-pay

## 2023-11-14 ENCOUNTER — Other Ambulatory Visit (HOSPITAL_COMMUNITY): Payer: Self-pay

## 2023-11-14 ENCOUNTER — Other Ambulatory Visit (HOSPITAL_BASED_OUTPATIENT_CLINIC_OR_DEPARTMENT_OTHER): Payer: Self-pay

## 2023-11-14 LAB — CULTURE, BLOOD (ROUTINE X 2)
Culture: NO GROWTH
Culture: NO GROWTH
Special Requests: ADEQUATE

## 2023-11-14 MED ORDER — MORPHINE SULFATE ER 30 MG PO TBCR
30.0000 mg | EXTENDED_RELEASE_TABLET | Freq: Two times a day (BID) | ORAL | 0 refills | Status: DC
  Filled 2023-11-14 (×2): qty 30, 15d supply, fill #0

## 2023-11-14 MED ORDER — HYDROMORPHONE HCL 4 MG PO TABS: 4.0000 mg | ORAL_TABLET | ORAL | 0 refills | Status: DC | PRN

## 2023-11-14 MED ORDER — HYDROMORPHONE HCL 4 MG PO TABS
4.0000 mg | ORAL_TABLET | ORAL | 0 refills | Status: DC | PRN
  Filled 2023-11-14 (×2): qty 90, 15d supply, fill #0

## 2023-11-14 NOTE — Progress Notes (Signed)
 RN reached out to [facility] to follow up with referral.  [facility] services patients only that are under care from an MD at [facility] center.   RN left message to review potential reconsideration of appointment with [facility].

## 2023-11-14 NOTE — Telephone Encounter (Signed)
 Pt called and reported he was unable to pick up d/c meds d/t insurance PA. PA completed and approved, order adjustments made per Levon, NP

## 2023-11-14 NOTE — Telephone Encounter (Signed)
 Pt called asking for meds to be switched to [facility] d/t transportation issues.

## 2023-11-14 NOTE — Telephone Encounter (Signed)
 Attempted to call pt to notify of approved medications and pending medication refills. No answer, LVM and callback number,

## 2023-11-15 ENCOUNTER — Other Ambulatory Visit (HOSPITAL_COMMUNITY): Payer: Self-pay

## 2023-11-16 ENCOUNTER — Other Ambulatory Visit: Payer: Self-pay

## 2023-11-16 ENCOUNTER — Other Ambulatory Visit: Payer: Self-pay | Admitting: Nurse Practitioner

## 2023-11-16 NOTE — Progress Notes (Signed)
 Patient's information sent to [facility] to review scheduling for dental clearance prior to starting Zometa.  Pending review/scheduling.

## 2023-11-19 NOTE — Progress Notes (Unsigned)
 [facility] OFFICE PROGRESS NOTE  Patient Care Team: Oley Bascom RAMAN, NP as PCP - General (Pulmonary Disease) Vertell Pont, RN as Oncology Nurse Navigator Pickenpack-Cousar, Fannie SAILOR, NP as Nurse Practitioner (Hospice and Palliative Medicine) Delores Rojelio Caldron, NP as Nurse Practitioner (Nurse Practitioner)  [phone] y.o.male with past medical history of mCRPC, HLD, HTN, DM2 ,GERD,TIA admitted for new uncontrolled pain.  Consulted for prostate cancer.   Current diagnosis: mCRPC with bone metastases Germline testing: negative  Somatic testing: no actionable mutation Previous Treatment: 03/2021 LT-ADT with EBRT. Completed radiation in 08/2021. Paliative radiation to L hip and L rib completed in 03/2023. Current treatment: darolutamide . Was first prescribed to start in Nov 2024 but did not. He is now reporting taking 600 mg twice daily since end of March. PSA and Alk phos decreased. Discussed continue 600 mg twice daily. He expresses understanding.   Patient presented with worsening pain on the left side.  Imaging showed mildly progressive metastases.  PSA increased this month.  Patient reports has been taking darolutamide  as prescribed.  We discussed again that his prostate cancer is not curable but treatable.  If he has been taking his oral medication as prescribed and continue to progress, uncontrolled pain then may consider Pluvicto as outpatient.  After discussion, patient expresses he is not ready for hospice and would like to receive additional cancer directed treatment. Assessment & Plan   No orders of the defined types were placed in this encounter.    [name], MD  INTERVAL HISTORY: Patient returns for follow-up.  Oncology History  Malignant neoplasm of prostate (HCC)  04/11/2020 Tumor Marker   PSA 115   09/03/2020 Tumor Marker   PSA 113   10/27/2020 Cancer Staging   Staging form: Prostate, AJCC 8th Edition - Clinical stage from 10/27/2020: Stage IIIC (cT2a, cN0,  cM0, PSA: 113, Grade Group: 5) - Signed by Sherwood Rise, PA-C on 12/15/2020 Histopathologic type: Adenocarcinoma, NOS Stage prefix: Initial diagnosis Prostate specific antigen (PSA) range: 20 or greater Gleason primary pattern: 5 Gleason secondary pattern: 4 Gleason score: 9 Histologic grading system: 5 grade system Number of biopsy cores examined: 12 Number of biopsy cores positive: 12 Location of positive needle core biopsies: Both sides   10/27/2020 Imaging   From outside report CT AP reported no metastatic disease.   11/17/2020 Imaging   Bone scan Focal abnormal uptake in the posterior portion of the right side of the upper cervical spine most consistent with degenerative changes and 2 foci of abnormal uptake in the posterior portion of the right rib which are most likely posttraumatic.   12/15/2020 Initial Diagnosis   Malignant neoplasm of prostate (HCC)   03/2021 -  Chemotherapy   Report unable to reach patient for PET scan that was arranged.   Initiated LT ADT and EBRT completed in 08/2021   06/10/2022 Tumor Marker   PSA 7.16   07/14/2022 PET scan   PSMA PET 1. Several foci of intense radiotracer activity in the skeleton consistent with active skeletal metastasis. Metastatic lesions include the LEFT ischium, several spine lesions and multiple rib lesions. 2. On CT portion of exam there are innumerable small sclerotic lesions are which do not have radiotracer activity. 3. No focal activity in prostate gland. 4. No visceral metastasis.   10/18/2022 Tumor Marker   PSA 5.35   10/18/2022 Tumor Marker   PSA 5.35 Testosterone  13   02/13/2023 Tumor Marker   PSA 25 Testosterone  16.9   03/01/2023 Cancer Staging  Staging form: Prostate, AJCC 8th Edition - Pathologic stage from 03/01/2023: Stage IVB (pN0, pM1b, PSA: 25, Grade Group: 5) - Signed by Sherwood Rise, PA-C on 03/09/2023 Stage prefix: Recurrence Prostate specific antigen (PSA) range: 20 or greater Gleason  primary pattern: 5 Gleason secondary pattern: 4 Gleason score: 9 Histologic grading system: 5 grade system   03/01/2023 PET scan   PSMA PET Previously there approximately 10 skeletal metastasis now there approximately 50 skeletal metastasis.   IMPRESSION: 1. Significant interval progression of skeletal metastasis. 2. Increase in size and number of radiotracer avid skeletal metastasis. 3. No evidence of visceral metastasis or nodal metastasis. 4. No evidence of local recurrence in the prostate gland.      PHYSICAL EXAMINATION: ECOG PERFORMANCE STATUS: {CHL ONC ECOG PS:[id number]}  There were no vitals filed for this visit. There were no vitals filed for this visit.  Relevant data reviewed during this visit included ***

## 2023-11-19 NOTE — Assessment & Plan Note (Signed)
 PSMA PET today Evaluation for Pluvicto on 7/1 Follow-up 1 to 2 weeks before second treatment to check type and screen, CBC and labs.

## 2023-11-20 ENCOUNTER — Encounter: Payer: Self-pay | Admitting: Nurse Practitioner

## 2023-11-20 ENCOUNTER — Inpatient Hospital Stay

## 2023-11-20 ENCOUNTER — Inpatient Hospital Stay (HOSPITAL_BASED_OUTPATIENT_CLINIC_OR_DEPARTMENT_OTHER)

## 2023-11-20 ENCOUNTER — Other Ambulatory Visit (HOSPITAL_COMMUNITY): Payer: Self-pay

## 2023-11-20 ENCOUNTER — Encounter (HOSPITAL_COMMUNITY)
Admission: RE | Admit: 2023-11-20 | Discharge: 2023-11-20 | Disposition: A | Discharge status: Home / Self Care | Source: Ambulatory Visit

## 2023-11-20 ENCOUNTER — Other Ambulatory Visit: Payer: Self-pay | Admitting: Nurse Practitioner

## 2023-11-20 VITALS — BP 155/81 | HR 53 | Temp 97.6°F | Resp 16 | Ht 69.0 in | Wt 131.8 lb

## 2023-11-20 DIAGNOSIS — C799 Secondary malignant neoplasm of unspecified site: Secondary | ICD-10-CM | POA: Diagnosis not present

## 2023-11-20 DIAGNOSIS — C61 Malignant neoplasm of prostate: Secondary | ICD-10-CM

## 2023-11-20 DIAGNOSIS — Z79899 Other long term (current) drug therapy: Secondary | ICD-10-CM | POA: Diagnosis not present

## 2023-11-20 DIAGNOSIS — Z515 Encounter for palliative care: Secondary | ICD-10-CM

## 2023-11-20 DIAGNOSIS — E7849 Other hyperlipidemia: Secondary | ICD-10-CM | POA: Diagnosis not present

## 2023-11-20 DIAGNOSIS — Z5111 Encounter for antineoplastic chemotherapy: Secondary | ICD-10-CM | POA: Diagnosis present

## 2023-11-20 DIAGNOSIS — G893 Neoplasm related pain (acute) (chronic): Secondary | ICD-10-CM

## 2023-11-20 DIAGNOSIS — D519 Vitamin B12 deficiency anemia, unspecified: Secondary | ICD-10-CM | POA: Diagnosis not present

## 2023-11-20 DIAGNOSIS — C7951 Secondary malignant neoplasm of bone: Secondary | ICD-10-CM | POA: Diagnosis not present

## 2023-11-20 DIAGNOSIS — R112 Nausea with vomiting, unspecified: Secondary | ICD-10-CM | POA: Diagnosis not present

## 2023-11-20 DIAGNOSIS — F17290 Nicotine dependence, other tobacco product, uncomplicated: Secondary | ICD-10-CM | POA: Diagnosis not present

## 2023-11-20 LAB — CBC WITH DIFFERENTIAL (CANCER CENTER ONLY)
Abs Immature Granulocytes: 0.01 10*3/uL (ref 0.00–0.07)
Basophils Absolute: 0 10*3/uL (ref 0.0–0.1)
Basophils Relative: 1 %
Eosinophils Absolute: 0.1 10*3/uL (ref 0.0–0.5)
Eosinophils Relative: 3 %
HCT: 27 % — ABNORMAL LOW (ref 39.0–52.0)
Hemoglobin: 9.2 g/dL — ABNORMAL LOW (ref 13.0–17.0)
Immature Granulocytes: 0 %
Lymphocytes Relative: 15 %
Lymphs Abs: 0.7 10*3/uL (ref 0.7–4.0)
MCH: 23.8 pg — ABNORMAL LOW (ref 26.0–34.0)
MCHC: 34.1 g/dL (ref 30.0–36.0)
MCV: 69.8 fL — ABNORMAL LOW (ref 80.0–100.0)
Monocytes Absolute: 0.5 10*3/uL (ref 0.1–1.0)
Monocytes Relative: 10 %
Neutro Abs: 3.4 10*3/uL (ref 1.7–7.7)
Neutrophils Relative %: 71 %
Platelet Count: 345 10*3/uL (ref 150–400)
RBC: 3.87 MIL/uL — ABNORMAL LOW (ref 4.22–5.81)
RDW: 15.9 % — ABNORMAL HIGH (ref 11.5–15.5)
WBC Count: 4.7 10*3/uL (ref 4.0–10.5)
nRBC: 0 % (ref 0.0–0.2)

## 2023-11-20 LAB — CMP (CANCER CENTER ONLY)
ALT: <5 U/L (ref 0–44)
AST: 8 U/L — ABNORMAL LOW (ref 15–41)
Albumin: 3.7 g/dL (ref 3.5–5.0)
Alkaline Phosphatase: 397 U/L — ABNORMAL HIGH (ref 38–126)
Anion gap: 8 (ref 5–15)
BUN: 8 mg/dL (ref 8–23)
CO2: 28 mmol/L (ref 22–32)
Calcium: 9 mg/dL (ref 8.9–10.3)
Chloride: 100 mmol/L (ref 98–111)
Creatinine: 0.79 mg/dL (ref 0.61–1.24)
GFR, Estimated: >60 mL/min (ref >60)
Glucose, Bld: 92 mg/dL (ref 70–99)
Potassium: 3.4 mmol/L — ABNORMAL LOW (ref 3.5–5.1)
Sodium: 136 mmol/L (ref 135–145)
Total Bilirubin: 0.2 mg/dL (ref 0.0–1.2)
Total Protein: 7 g/dL (ref 6.5–8.1)

## 2023-11-20 MED ORDER — XTAMPZA ER 13.5 MG PO C12A
13.5000 mg | EXTENDED_RELEASE_CAPSULE | Freq: Two times a day (BID) | ORAL | 0 refills | Status: DC
  Filled 2023-11-20 (×2): qty 30, 15d supply, fill #0

## 2023-11-20 MED ORDER — OXYCODONE HCL 15 MG PO TABS
15.0000 mg | ORAL_TABLET | ORAL | 0 refills | Status: DC | PRN
  Filled 2023-11-20 (×2): qty 90, 15d supply, fill #0

## 2023-11-20 NOTE — Progress Notes (Signed)
 Due to delay at registration, patient was not seen. Patient has other appointment and cannot miss his PET scan for preparation for Pluvicto.  Will follow up about 4-5 weeks after first treatment.
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
## 2023-11-21 ENCOUNTER — Inpatient Hospital Stay: Attending: Radiation Oncology

## 2023-11-21 ENCOUNTER — Ambulatory Visit (HOSPITAL_COMMUNITY): Admission: RE | Admit: 2023-11-21 | Source: Ambulatory Visit

## 2023-11-21 ENCOUNTER — Other Ambulatory Visit (HOSPITAL_COMMUNITY): Payer: Self-pay

## 2023-11-21 ENCOUNTER — Telehealth: Payer: Self-pay

## 2023-11-21 ENCOUNTER — Encounter (HOSPITAL_COMMUNITY)

## 2023-11-21 ENCOUNTER — Other Ambulatory Visit: Payer: Self-pay

## 2023-11-21 LAB — TESTOSTERONE: Testosterone: 12 ng/dL — ABNORMAL LOW (ref 264–916)

## 2023-11-21 LAB — PROSTATE-SPECIFIC AG, SERUM (LABCORP): Prostate Specific Ag, Serum: 53.6 ng/mL — ABNORMAL HIGH (ref 0.0–4.0)

## 2023-11-21 NOTE — Telephone Encounter (Signed)
Scheduled appointments with the patient

## 2023-11-21 NOTE — Telephone Encounter (Signed)
 Pt called the office stating he was unable to pick up medications. RN called pharmacy and medication is available for pick up today. Attempted to call pt no answer, LVM and callback number.

## 2023-11-22 ENCOUNTER — Other Ambulatory Visit (HOSPITAL_COMMUNITY): Payer: Self-pay

## 2023-12-04 ENCOUNTER — Encounter (HOSPITAL_COMMUNITY): Payer: Self-pay

## 2023-12-04 ENCOUNTER — Encounter (HOSPITAL_COMMUNITY): Admission: RE | Admit: 2023-12-04 | Source: Ambulatory Visit

## 2023-12-05 NOTE — Progress Notes (Signed)
 Patient reached out to contact at [facility] to inquire if they would be able to see patient for clearance prior to start of Zometa.    Staff attempted several times to schedule patient for consult, however, he declined appointment.   RN spoke with patient and again explained rationale for needing dental clearance.  Patient verbalized agreement.  RN communicated with staff at [facility], they will reach out to patient to schedule.    Will follow to confirm appointment and ensure transportation needs are accommodated.

## 2023-12-06 ENCOUNTER — Ambulatory Visit: Payer: Self-pay | Admitting: Nurse Practitioner

## 2023-12-07 ENCOUNTER — Other Ambulatory Visit: Payer: Self-pay

## 2023-12-07 NOTE — Progress Notes (Signed)
 Specialty Pharmacy Refill Coordination Note  BRACK SHADDOCK is a 68 y.o. male contacted today regarding refills of specialty medication(s) Darolutamide  (NUBEQA )   Patient requested Delivery   Delivery date: 12/11/23   Verified address: [address]   [place] [address]   Medication will be filled on 12/08/23.

## 2023-12-08 ENCOUNTER — Other Ambulatory Visit: Payer: Self-pay

## 2023-12-14 ENCOUNTER — Other Ambulatory Visit: Payer: Self-pay | Admitting: Nurse Practitioner

## 2023-12-14 ENCOUNTER — Inpatient Hospital Stay (HOSPITAL_COMMUNITY)
Admission: EM | Admit: 2023-12-14 | Discharge: 2023-12-19 | DRG: 947 | Disposition: A | Discharge status: Home / Self Care | Source: Ambulatory Visit | Attending: Internal Medicine | Admitting: Internal Medicine

## 2023-12-14 ENCOUNTER — Emergency Department (HOSPITAL_COMMUNITY)

## 2023-12-14 ENCOUNTER — Emergency Department (HOSPITAL_COMMUNITY)
Admission: RE | Admit: 2023-12-14 | Discharge: 2023-12-14 | Disposition: A | Discharge status: Home / Self Care | Source: Ambulatory Visit

## 2023-12-14 ENCOUNTER — Other Ambulatory Visit: Payer: Self-pay

## 2023-12-14 ENCOUNTER — Other Ambulatory Visit (HOSPITAL_COMMUNITY)

## 2023-12-14 ENCOUNTER — Encounter (HOSPITAL_COMMUNITY): Payer: Self-pay

## 2023-12-14 ENCOUNTER — Encounter (HOSPITAL_COMMUNITY)
Admission: RE | Admit: 2023-12-14 | Discharge: 2023-12-14 | Disposition: A | Discharge status: Home / Self Care | Source: Ambulatory Visit

## 2023-12-14 DIAGNOSIS — Z5986 Financial insecurity: Secondary | ICD-10-CM

## 2023-12-14 DIAGNOSIS — C799 Secondary malignant neoplasm of unspecified site: Secondary | ICD-10-CM | POA: Diagnosis present

## 2023-12-14 DIAGNOSIS — C61 Malignant neoplasm of prostate: Secondary | ICD-10-CM

## 2023-12-14 DIAGNOSIS — E43 Unspecified severe protein-calorie malnutrition: Secondary | ICD-10-CM | POA: Diagnosis present

## 2023-12-14 DIAGNOSIS — E871 Hypo-osmolality and hyponatremia: Secondary | ICD-10-CM | POA: Diagnosis present

## 2023-12-14 DIAGNOSIS — E785 Hyperlipidemia, unspecified: Secondary | ICD-10-CM | POA: Diagnosis present

## 2023-12-14 DIAGNOSIS — D509 Iron deficiency anemia, unspecified: Secondary | ICD-10-CM | POA: Diagnosis present

## 2023-12-14 DIAGNOSIS — T451X5A Adverse effect of antineoplastic and immunosuppressive drugs, initial encounter: Secondary | ICD-10-CM | POA: Diagnosis present

## 2023-12-14 DIAGNOSIS — I2510 Atherosclerotic heart disease of native coronary artery without angina pectoris: Secondary | ICD-10-CM | POA: Diagnosis present

## 2023-12-14 DIAGNOSIS — I10 Essential (primary) hypertension: Secondary | ICD-10-CM | POA: Diagnosis not present

## 2023-12-14 DIAGNOSIS — Z8673 Personal history of transient ischemic attack (TIA), and cerebral infarction without residual deficits: Secondary | ICD-10-CM

## 2023-12-14 DIAGNOSIS — E119 Type 2 diabetes mellitus without complications: Secondary | ICD-10-CM

## 2023-12-14 DIAGNOSIS — C7951 Secondary malignant neoplasm of bone: Secondary | ICD-10-CM | POA: Diagnosis present

## 2023-12-14 DIAGNOSIS — Z923 Personal history of irradiation: Secondary | ICD-10-CM

## 2023-12-14 DIAGNOSIS — Z5941 Food insecurity: Secondary | ICD-10-CM

## 2023-12-14 DIAGNOSIS — Z5982 Transportation insecurity: Secondary | ICD-10-CM

## 2023-12-14 DIAGNOSIS — E782 Mixed hyperlipidemia: Secondary | ICD-10-CM | POA: Diagnosis present

## 2023-12-14 DIAGNOSIS — D6481 Anemia due to antineoplastic chemotherapy: Secondary | ICD-10-CM | POA: Diagnosis present

## 2023-12-14 DIAGNOSIS — K5900 Constipation, unspecified: Secondary | ICD-10-CM | POA: Diagnosis not present

## 2023-12-14 DIAGNOSIS — Z8711 Personal history of peptic ulcer disease: Secondary | ICD-10-CM

## 2023-12-14 DIAGNOSIS — K219 Gastro-esophageal reflux disease without esophagitis: Secondary | ICD-10-CM | POA: Diagnosis present

## 2023-12-14 DIAGNOSIS — D638 Anemia in other chronic diseases classified elsewhere: Secondary | ICD-10-CM

## 2023-12-14 DIAGNOSIS — Z8249 Family history of ischemic heart disease and other diseases of the circulatory system: Secondary | ICD-10-CM

## 2023-12-14 DIAGNOSIS — Z7984 Long term (current) use of oral hypoglycemic drugs: Secondary | ICD-10-CM

## 2023-12-14 DIAGNOSIS — Z681 Body mass index (BMI) 19 or less, adult: Secondary | ICD-10-CM

## 2023-12-14 DIAGNOSIS — Z720 Tobacco use: Secondary | ICD-10-CM | POA: Diagnosis present

## 2023-12-14 DIAGNOSIS — D630 Anemia in neoplastic disease: Secondary | ICD-10-CM | POA: Diagnosis present

## 2023-12-14 DIAGNOSIS — F17290 Nicotine dependence, other tobacco product, uncomplicated: Secondary | ICD-10-CM | POA: Diagnosis present

## 2023-12-14 DIAGNOSIS — Z823 Family history of stroke: Secondary | ICD-10-CM

## 2023-12-14 DIAGNOSIS — Z888 Allergy status to other drugs, medicaments and biological substances status: Secondary | ICD-10-CM

## 2023-12-14 DIAGNOSIS — D6182 Myelophthisis: Secondary | ICD-10-CM | POA: Diagnosis present

## 2023-12-14 DIAGNOSIS — I252 Old myocardial infarction: Secondary | ICD-10-CM

## 2023-12-14 DIAGNOSIS — E876 Hypokalemia: Secondary | ICD-10-CM | POA: Diagnosis present

## 2023-12-14 DIAGNOSIS — Z79899 Other long term (current) drug therapy: Secondary | ICD-10-CM

## 2023-12-14 DIAGNOSIS — R197 Diarrhea, unspecified: Secondary | ICD-10-CM | POA: Diagnosis present

## 2023-12-14 DIAGNOSIS — G893 Neoplasm related pain (acute) (chronic): Secondary | ICD-10-CM | POA: Diagnosis not present

## 2023-12-14 DIAGNOSIS — E1169 Type 2 diabetes mellitus with other specified complication: Secondary | ICD-10-CM | POA: Diagnosis present

## 2023-12-14 DIAGNOSIS — C7931 Secondary malignant neoplasm of brain: Secondary | ICD-10-CM | POA: Diagnosis present

## 2023-12-14 DIAGNOSIS — Z800 Family history of malignant neoplasm of digestive organs: Secondary | ICD-10-CM

## 2023-12-14 LAB — CBC
HCT: 30.1 % — ABNORMAL LOW (ref 39.0–52.0)
Hemoglobin: 9.8 g/dL — ABNORMAL LOW (ref 13.0–17.0)
MCH: 22.8 pg — ABNORMAL LOW (ref 26.0–34.0)
MCHC: 32.6 g/dL (ref 30.0–36.0)
MCV: 70 fL — ABNORMAL LOW (ref 80.0–100.0)
Platelets: 283 K/uL (ref 150–400)
RBC: 4.3 MIL/uL (ref 4.22–5.81)
RDW: 16.5 % — ABNORMAL HIGH (ref 11.5–15.5)
WBC: 8.5 K/uL (ref 4.0–10.5)
nRBC: 0 % (ref 0.0–0.2)

## 2023-12-14 LAB — PHOSPHORUS: Phosphorus: 2.8 mg/dL (ref 2.5–4.6)

## 2023-12-14 LAB — OSMOLALITY: Osmolality: 273 mosm/kg — ABNORMAL LOW (ref 275–295)

## 2023-12-14 LAB — IRON AND TIBC
Iron: 15 ug/dL — ABNORMAL LOW (ref 45–182)
Saturation Ratios: 6 % — ABNORMAL LOW (ref 17.9–39.5)
TIBC: 234 ug/dL — ABNORMAL LOW (ref 250–450)
UIBC: 219 ug/dL

## 2023-12-14 LAB — COMPREHENSIVE METABOLIC PANEL WITH GFR
ALT: 7 U/L (ref 0–44)
AST: 14 U/L — ABNORMAL LOW (ref 15–41)
Albumin: 3.4 g/dL — ABNORMAL LOW (ref 3.5–5.0)
Alkaline Phosphatase: 600 U/L — ABNORMAL HIGH (ref 38–126)
Anion gap: 12 (ref 5–15)
BUN: 10 mg/dL (ref 8–23)
CO2: 26 mmol/L (ref 22–32)
Calcium: 9.2 mg/dL (ref 8.9–10.3)
Chloride: 95 mmol/L — ABNORMAL LOW (ref 98–111)
Creatinine, Ser: 0.89 mg/dL (ref 0.61–1.24)
GFR, Estimated: >60 mL/min (ref >60)
Glucose, Bld: 108 mg/dL — ABNORMAL HIGH (ref 70–99)
Potassium: 3.5 mmol/L (ref 3.5–5.1)
Sodium: 133 mmol/L — ABNORMAL LOW (ref 135–145)
Total Bilirubin: 0.5 mg/dL (ref 0.0–1.2)
Total Protein: 7.9 g/dL (ref 6.5–8.1)

## 2023-12-14 LAB — URINALYSIS, COMPLETE (UACMP) WITH MICROSCOPIC
Bacteria, UA: NONE SEEN
Bilirubin Urine: NEGATIVE
Glucose, UA: 50 mg/dL — AB
Ketones, ur: NEGATIVE mg/dL
Leukocytes,Ua: NEGATIVE
Nitrite: NEGATIVE
Protein, ur: NEGATIVE mg/dL
Specific Gravity, Urine: >1.046 — ABNORMAL HIGH (ref 1.005–1.030)
pH: 5 (ref 5.0–8.0)

## 2023-12-14 LAB — CBG MONITORING, ED: Glucose-Capillary: 110 mg/dL — ABNORMAL HIGH (ref 70–99)

## 2023-12-14 LAB — BASIC METABOLIC PANEL WITH GFR
Anion gap: 10 (ref 5–15)
BUN: 10 mg/dL (ref 8–23)
CO2: 24 mmol/L (ref 22–32)
Calcium: 8.4 mg/dL — ABNORMAL LOW (ref 8.9–10.3)
Chloride: 95 mmol/L — ABNORMAL LOW (ref 98–111)
Creatinine, Ser: 0.77 mg/dL (ref 0.61–1.24)
GFR, Estimated: >60 mL/min (ref >60)
Glucose, Bld: 111 mg/dL — ABNORMAL HIGH (ref 70–99)
Potassium: 3.3 mmol/L — ABNORMAL LOW (ref 3.5–5.1)
Sodium: 129 mmol/L — ABNORMAL LOW (ref 135–145)

## 2023-12-14 LAB — CREATININE, URINE, RANDOM: Creatinine, Urine: 77 mg/dL

## 2023-12-14 LAB — TROPONIN I (HIGH SENSITIVITY)
Troponin I (High Sensitivity): 9 ng/L (ref <18)
Troponin I (High Sensitivity): 8 ng/L (ref <18)

## 2023-12-14 LAB — SODIUM, URINE, RANDOM: Sodium, Ur: 40 mmol/L

## 2023-12-14 LAB — FERRITIN: Ferritin: 459 ng/mL — ABNORMAL HIGH (ref 24–336)

## 2023-12-14 LAB — LIPASE, BLOOD: Lipase: 23 U/L (ref 11–51)

## 2023-12-14 LAB — OSMOLALITY, URINE: Osmolality, Ur: 503 mosm/kg (ref 300–900)

## 2023-12-14 LAB — RETICULOCYTES
Immature Retic Fract: 16.7 % — ABNORMAL HIGH (ref 2.3–15.9)
RBC.: 4.38 MIL/uL (ref 4.22–5.81)
Retic Count, Absolute: 39.4 K/uL (ref 19.0–186.0)
Retic Ct Pct: 0.9 % (ref 0.4–3.1)

## 2023-12-14 LAB — CK: Total CK: 43 U/L — ABNORMAL LOW (ref 49–397)

## 2023-12-14 LAB — MAGNESIUM: Magnesium: 1.8 mg/dL (ref 1.7–2.4)

## 2023-12-14 MED ORDER — POTASSIUM CHLORIDE CRYS ER 20 MEQ PO TBCR
40.0000 meq | EXTENDED_RELEASE_TABLET | Freq: Once | ORAL | Status: AC
  Administered 2023-12-14: 40 meq via ORAL
  Filled 2023-12-14: qty 2

## 2023-12-14 MED ORDER — LACTATED RINGERS IV BOLUS
1000.0000 mL | Freq: Once | INTRAVENOUS | Status: AC
  Administered 2023-12-14: 1000 mL via INTRAVENOUS

## 2023-12-14 MED ORDER — NICOTINE 14 MG/24HR TD PT24
14.0000 mg | MEDICATED_PATCH | Freq: Every day | TRANSDERMAL | Status: DC
  Administered 2023-12-15 – 2023-12-19 (×5): 14 mg via TRANSDERMAL
  Filled 2023-12-14 (×5): qty 1

## 2023-12-14 MED ORDER — OXYCODONE HCL 5 MG PO TABS
15.0000 mg | ORAL_TABLET | ORAL | Status: DC | PRN
  Administered 2023-12-15 (×2): 15 mg via ORAL
  Filled 2023-12-14 (×2): qty 3

## 2023-12-14 MED ORDER — HYDROMORPHONE HCL 1 MG/ML IJ SOLN
0.5000 mg | INTRAMUSCULAR | Status: DC | PRN
  Administered 2023-12-14 – 2023-12-15 (×7): 1 mg via INTRAVENOUS
  Filled 2023-12-14 (×7): qty 1

## 2023-12-14 MED ORDER — ONDANSETRON HCL 4 MG/2ML IJ SOLN
4.0000 mg | Freq: Four times a day (QID) | INTRAMUSCULAR | Status: DC | PRN
  Administered 2023-12-19: 4 mg via INTRAVENOUS
  Filled 2023-12-14: qty 2

## 2023-12-14 MED ORDER — INSULIN ASPART 100 UNIT/ML IJ SOLN
0.0000 [IU] | Freq: Every day | INTRAMUSCULAR | Status: DC
  Filled 2023-12-14: qty 0.05

## 2023-12-14 MED ORDER — HYDROMORPHONE HCL 1 MG/ML IJ SOLN
1.0000 mg | Freq: Once | INTRAMUSCULAR | Status: AC
  Administered 2023-12-14: 1 mg via INTRAVENOUS
  Filled 2023-12-14: qty 1

## 2023-12-14 MED ORDER — ATORVASTATIN CALCIUM 10 MG PO TABS
10.0000 mg | ORAL_TABLET | Freq: Every morning | ORAL | Status: DC
  Administered 2023-12-15 – 2023-12-19 (×5): 10 mg via ORAL
  Filled 2023-12-14 (×5): qty 1

## 2023-12-14 MED ORDER — ONDANSETRON HCL 4 MG/2ML IJ SOLN
4.0000 mg | Freq: Once | INTRAMUSCULAR | Status: AC
  Administered 2023-12-14: 4 mg via INTRAVENOUS
  Filled 2023-12-14: qty 2

## 2023-12-14 MED ORDER — ACETAMINOPHEN 650 MG RE SUPP: 650.0000 mg | Freq: Four times a day (QID) | RECTAL | Status: DC | PRN

## 2023-12-14 MED ORDER — ACETAMINOPHEN 325 MG PO TABS
650.0000 mg | ORAL_TABLET | Freq: Four times a day (QID) | ORAL | Status: DC | PRN
  Administered 2023-12-17 – 2023-12-19 (×3): 650 mg via ORAL
  Filled 2023-12-14 (×3): qty 2

## 2023-12-14 MED ORDER — OXYCODONE HCL ER 15 MG PO T12A
15.0000 mg | EXTENDED_RELEASE_TABLET | Freq: Two times a day (BID) | ORAL | Status: DC
  Administered 2023-12-15 (×2): 15 mg via ORAL
  Filled 2023-12-14 (×2): qty 1

## 2023-12-14 MED ORDER — SODIUM CHLORIDE 0.9 % IV SOLN: INTRAVENOUS | Status: DC

## 2023-12-14 MED ORDER — INSULIN ASPART 100 UNIT/ML IJ SOLN
0.0000 [IU] | Freq: Three times a day (TID) | INTRAMUSCULAR | Status: DC
  Filled 2023-12-14: qty 0.09

## 2023-12-14 MED ORDER — FLOTUFOLASTAT F 18 GALLIUM 296-5846 MBQ/ML IV SOLN
8.5000 | Freq: Once | INTRAVENOUS | Status: AC
  Administered 2023-12-14: 8.5 via INTRAVENOUS

## 2023-12-14 MED ORDER — CARVEDILOL 12.5 MG PO TABS
12.5000 mg | ORAL_TABLET | Freq: Two times a day (BID) | ORAL | Status: DC
  Administered 2023-12-15 – 2023-12-19 (×10): 12.5 mg via ORAL
  Filled 2023-12-14 (×10): qty 1

## 2023-12-14 MED ORDER — AMLODIPINE BESYLATE 5 MG PO TABS
10.0000 mg | ORAL_TABLET | Freq: Every morning | ORAL | Status: DC
  Administered 2023-12-15 – 2023-12-19 (×5): 10 mg via ORAL
  Filled 2023-12-14 (×5): qty 2

## 2023-12-14 MED ORDER — ONDANSETRON HCL 4 MG PO TABS
4.0000 mg | ORAL_TABLET | Freq: Four times a day (QID) | ORAL | Status: DC | PRN
  Administered 2023-12-18: 4 mg via ORAL
  Filled 2023-12-14: qty 1

## 2023-12-14 MED ORDER — TRAZODONE HCL 100 MG PO TABS
50.0000 mg | ORAL_TABLET | Freq: Every evening | ORAL | Status: DC | PRN
  Filled 2023-12-14: qty 0.5

## 2023-12-14 MED ORDER — POTASSIUM CHLORIDE 10 MEQ/100ML IV SOLN
10.0000 meq | INTRAVENOUS | Status: AC
  Administered 2023-12-14 (×2): 10 meq via INTRAVENOUS
  Filled 2023-12-14 (×2): qty 100

## 2023-12-14 MED ORDER — DAROLUTAMIDE 300 MG PO TABS: 600.0000 mg | ORAL_TABLET | Freq: Two times a day (BID) | ORAL | Status: DC

## 2023-12-14 MED ORDER — ALBUTEROL SULFATE (2.5 MG/3ML) 0.083% IN NEBU: 2.5000 mg | INHALATION_SOLUTION | RESPIRATORY_TRACT | Status: DC | PRN

## 2023-12-14 MED ORDER — POTASSIUM CHLORIDE 10 MEQ/100ML IV SOLN: 10.0000 meq | INTRAVENOUS | Status: DC

## 2023-12-14 MED ORDER — IOHEXOL 350 MG/ML SOLN
100.0000 mL | Freq: Once | INTRAVENOUS | Status: AC | PRN
  Administered 2023-12-14: 100 mL via INTRAVENOUS

## 2023-12-14 NOTE — ED Triage Notes (Signed)
 Pt arrived via wheelchair from cancer center/IR after procedure. Pt complained of generalized weakness and body aches since Sunday. Pt also reports n/v/d since Sunday. 3 episodes of vomiting. 10/10 pain

## 2023-12-14 NOTE — Assessment & Plan Note (Signed)
Continue Lipitor 10 mg a day 

## 2023-12-14 NOTE — H&P (Signed)
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   Oley Bascom RAMAN, NP  ondansetron  (ZOFRAN ) 4 MG tablet Take 1 tablet (4 mg total) by mouth every 8 (eight) hours as needed for nausea 10/25/23   Pickenpack-Cousar, Fannie SAILOR, NP  oxyCODONE  (ROXICODONE ) 15 MG immediate release tablet Take 1 tablet (15 mg total) by mouth every 4 (four) hours as needed for pain. 11/20/23   Pickenpack-Cousar, Athena N, NP  oxyCODONE  ER (XTAMPZA  ER) 13.5 MG C12A Take 1 capsule (13.5 mg) by mouth every 12 (twelve) hours. 11/20/23   Pickenpack-Cousar, Fannie SAILOR, NP  polyethylene glycol powder (MIRALAX ) 17 GM/SCOOP powder Take 17 grams dissolved in liquid by mouth daily. Patient taking  differently: Take 17 g by mouth daily as needed for moderate constipation. 08/04/23   Oley Bascom RAMAN, NP  senna-docusate (SENOKOT-S) 8.6-50 MG tablet Take 1 tablet by mouth 2 (two) times daily. Patient taking differently: Take 1 tablet by mouth 2 (two) times daily as needed for moderate constipation. 07/19/23   Jillian Buttery, MD  sildenafil  (VIAGRA ) 100 MG tablet Take 1 tablet by mouth 30 minutes before sexual activity as needed for erectile dysfunction 07/19/23   Marvine Almarie CROME, DO  traZODone  (DESYREL ) 50 MG tablet Take 1 tablet (50 mg total) by mouth at bedtime. Patient taking differently: Take 50 mg by mouth at bedtime as needed for sleep. 09/06/23   Oley Bascom RAMAN, NP    ___________________________________________________________________________________________________ Physical Exam:    12/14/2023    5:21 PM 12/14/2023    3:15 PM 12/14/2023    3:13 PM  Vitals with BMI  Height   5' 9  Weight   132 lbs  BMI   19.48  Systolic 159 205   Diastolic 82 108   Pulse 83 83      1. General:  in No  Acute distress   Chronically ill   -appearing 2. Psychological: Alert and   Oriented 3. Head/ENT:    Dry Mucous Membranes                          Head Non traumatic, neck supple  Poor Dentition 4. SKIN: decreased Skin turgor,  Skin clean Dry and intact no rash    5. Heart: Regular rate and rhythm no  Murmur, no Rub or gallop 6. Lungs:  , no wheezes or crackles   7. Abdomen: Soft,  non-tender, Non distended  bowel sounds present 8. Lower extremities: no clubbing, cyanosis, no  edema 9. Neurologically Grossly intact, moving all 4 extremities equally   10. MSK: Normal range of motion    Chart has been reviewed  ______________________________________________________________________________________________  Assessment/Plan  68 y.o. male with medical history significant of metastatic prostate CA, hyponatremia, HLD, HTN, diabetes, history of TIA, past history  of alcohol  abuse difficult to control high blood pressure, GERD    Admitted for   Malignant neoplasm of prostate (HCC)      Present on Admission:  Cancer associated pain  Anemia associated with chemotherapy  Tobacco abuse  Essential hypertension  Hypokalemia  Coronary artery disease  Diarrhea  Dyslipidemia  Malignant neoplasm of prostate (HCC)  Protein-calorie malnutrition, severe  Hyponatremia     Anemia associated with chemotherapy Obtain anemia panel  Transfuse for Hg <7 , rapidly dropping or  if symptomatic   Tobacco abuse  - Spoke about importance of quitting spent 5 minutes discussing options for treatment, prior attempts at quitting, and dangers of smoking  -At this point patient is   nterested in quitting  - order nicotine  patch   - nursing tobacco cessation protocol   Type 2 diabetes mellitus without complication, without long-term current use of insulin  (HCC)  - Order Sensitive   SSI    -  check TSH and HgA1C     Essential hypertension Continue amlodipine  10 mg a day and Coreg  12.5 twice daily  Hypokalemia - will replace electrolytes and repeat  check Mg, phos and Ca level and replace as needed Monitor on telemetry   Lab Results  Component Value Date   K 3.3 (L) 12/14/2023     Lab Results  Component Value Date   CREATININE 0.77 12/14/2023   Lab Results  Component Value Date   MG 1.8 12/14/2023   Lab Results  Component Value Date   CALCIUM  8.4 (L) 12/14/2023   PHOS 2.8 12/14/2023     Cancer associated pain Pain management continue home medications and add Dilaudid  as needed CT abdomen not changed CTA no evidence of PE stent stable chronic left lung volume loss and bronchiectasis  Coronary artery disease Continue Coreg  12.5 mg twice daily  Diabetes mellitus without complication (HCC) Order sliding scale  Diarrhea States resolved for now abdomen CT unremarkable  Dyslipidemia Continue Lipitor 10 mg a day  Malignant neoplasm of  prostate (HCC) Chronic followed by oncology  Protein-calorie malnutrition, severe Would benefit from nutritional consult check prealbumin  Hyponatremia Obtain urine electrolytes Monitor sodium Up peers to be currently fluid down patient decreased p.o. intake Gently rehydrate and follow   Other plan as per orders.  DVT prophylaxis:  SCD      Code Status:    Code Status: Prior FULL CODE  as per patient   I had personally discussed CODE STATUS with patient   ACP   none      Family Communication:   Family not at  Bedside   Diet  Diet Orders (From admission, onward)     Start     Ordered   12/14/23 1935  Diet Carb Modified Fluid consistency: Thin; Room service appropriate? Yes  Diet effective now  Question Answer Comment  Diet-HS Snack? Nothing   Calorie Level Medium 1600-2000   Fluid consistency: Thin   Room service appropriate? Yes      12/14/23 1934            Disposition Plan:         To home once workup is complete and patient is stable   Following barriers for discharge:                             pain controlled                              Pain controlled with PO medications                                                          Will need consultants to evaluate patient prior to discharge                            Consult Orders  (From admission, onward)           Start     Ordered   12/14/23 1913  Consult to hospitalist  Once       Provider:  (Not yet assigned)  Question Answer Comment  Place call to: [facility]   Reason for Consult Admit      12/14/23 1912                               Would benefit from PT/OT eval prior to DC  Ordered                                     Consults called:    NONE   Admission status:  ED Disposition     ED Disposition  Admit   Condition  --   Comment  Hospital Area: [facility] [[id number]]  Level of Care: Telemetry [5]  Admit to tele based on following  criteria: Other see comments  Comments: hypokalemia  May place patient in observation at [facility] or [facility] if equivalent level of care is available:: No  Covid Evaluation: Asymptomatic - no recent exposure (last 10 days) testing not required  Diagnosis: Cancer associated pain [309085]  Admitting Physician: Florance Paolillo [[id number]]  Attending Physician: Elnore Cosens [[id number]]  For patients discharging to extended facilities (i.e. SNF, AL, group homes or LTAC) initiate:: Discharge to SNF/Facility Placement COVID-19 Lab Testing Protocol          Obs     Level of care     tele  For 12H    Kamarrion Stfort 12/14/2023, 8:55 PM    [facility]     after 2 AM please page floor coverage   If 7AM-7PM, please contact the day team taking care of the patient using [email or web address]

## 2023-12-14 NOTE — Assessment & Plan Note (Signed)
 Continue Coreg 12.5 mg twice daily.
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
## 2023-12-14 NOTE — Assessment & Plan Note (Signed)
Order sliding scale  

## 2023-12-14 NOTE — Assessment & Plan Note (Addendum)
Chronic followed by oncology

## 2023-12-14 NOTE — Assessment & Plan Note (Signed)
Would benefit from nutritional consult check prealbumin
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
## 2023-12-14 NOTE — Assessment & Plan Note (Signed)
-   will replace electrolytes and repeat  check Mg, phos and Ca level and replace as needed Monitor on telemetry   Lab Results  Component Value Date   K 3.3 (L) 12/14/2023     Lab Results  Component Value Date   CREATININE 0.77 12/14/2023   Lab Results  Component Value Date   MG 1.8 12/14/2023   Lab Results  Component Value Date   CALCIUM  8.4 (L) 12/14/2023   PHOS 2.8 12/14/2023

## 2023-12-14 NOTE — Assessment & Plan Note (Signed)
 Continue amlodipine  10 mg a day and Coreg  12.5 twice daily

## 2023-12-14 NOTE — Assessment & Plan Note (Signed)
-   Order Sensitive SSI  °  ° -  check TSH and HgA1C °  ° ° °

## 2023-12-14 NOTE — Assessment & Plan Note (Signed)
 States resolved for now abdomen CT unremarkable

## 2023-12-14 NOTE — Assessment & Plan Note (Signed)
 Obtain anemia panel  Transfuse for Hg <7 , rapidly dropping or  if symptomatic

## 2023-12-14 NOTE — Subjective & Objective (Signed)
 Pt comes in with poorly controlled pain history of prostate cancer arrives in the wheelchair from cancer center generalized weakness body aches since Sunday nausea vomiting diarrhea 3 episodes of vomiting Send PET scan showing worse disease

## 2023-12-14 NOTE — Assessment & Plan Note (Signed)
-   Spoke about importance of quitting spent 5 minutes discussing options for treatment, prior attempts at quitting, and dangers of smoking  -At this point patient is   nterested in quitting  - order nicotine  patch   - nursing tobacco cessation protocol

## 2023-12-14 NOTE — Assessment & Plan Note (Signed)
 Obtain urine electrolytes Monitor sodium Up peers to be currently fluid down patient decreased p.o. intake Gently rehydrate and follow

## 2023-12-14 NOTE — Assessment & Plan Note (Addendum)
 Pain management continue home medications and add Dilaudid  as needed CT abdomen not changed CTA no evidence of PE stent stable chronic left lung volume loss and bronchiectasis

## 2023-12-15 DIAGNOSIS — D509 Iron deficiency anemia, unspecified: Secondary | ICD-10-CM | POA: Diagnosis present

## 2023-12-15 DIAGNOSIS — D6182 Myelophthisis: Secondary | ICD-10-CM | POA: Diagnosis not present

## 2023-12-15 DIAGNOSIS — C7951 Secondary malignant neoplasm of bone: Secondary | ICD-10-CM | POA: Diagnosis present

## 2023-12-15 DIAGNOSIS — D630 Anemia in neoplastic disease: Secondary | ICD-10-CM | POA: Diagnosis present

## 2023-12-15 DIAGNOSIS — Z8673 Personal history of transient ischemic attack (TIA), and cerebral infarction without residual deficits: Secondary | ICD-10-CM | POA: Diagnosis not present

## 2023-12-15 DIAGNOSIS — I252 Old myocardial infarction: Secondary | ICD-10-CM | POA: Diagnosis not present

## 2023-12-15 DIAGNOSIS — G893 Neoplasm related pain (acute) (chronic): Secondary | ICD-10-CM | POA: Diagnosis present

## 2023-12-15 DIAGNOSIS — E782 Mixed hyperlipidemia: Secondary | ICD-10-CM | POA: Diagnosis present

## 2023-12-15 DIAGNOSIS — Z923 Personal history of irradiation: Secondary | ICD-10-CM | POA: Diagnosis not present

## 2023-12-15 DIAGNOSIS — Z888 Allergy status to other drugs, medicaments and biological substances status: Secondary | ICD-10-CM | POA: Diagnosis not present

## 2023-12-15 DIAGNOSIS — Z7984 Long term (current) use of oral hypoglycemic drugs: Secondary | ICD-10-CM | POA: Diagnosis not present

## 2023-12-15 DIAGNOSIS — I2510 Atherosclerotic heart disease of native coronary artery without angina pectoris: Secondary | ICD-10-CM | POA: Diagnosis present

## 2023-12-15 DIAGNOSIS — I10 Essential (primary) hypertension: Secondary | ICD-10-CM | POA: Diagnosis present

## 2023-12-15 DIAGNOSIS — E876 Hypokalemia: Secondary | ICD-10-CM | POA: Diagnosis present

## 2023-12-15 DIAGNOSIS — E43 Unspecified severe protein-calorie malnutrition: Secondary | ICD-10-CM | POA: Diagnosis present

## 2023-12-15 DIAGNOSIS — Z681 Body mass index (BMI) 19 or less, adult: Secondary | ICD-10-CM | POA: Diagnosis not present

## 2023-12-15 DIAGNOSIS — E1169 Type 2 diabetes mellitus with other specified complication: Secondary | ICD-10-CM | POA: Diagnosis present

## 2023-12-15 DIAGNOSIS — C799 Secondary malignant neoplasm of unspecified site: Secondary | ICD-10-CM | POA: Diagnosis present

## 2023-12-15 DIAGNOSIS — Z8249 Family history of ischemic heart disease and other diseases of the circulatory system: Secondary | ICD-10-CM | POA: Diagnosis not present

## 2023-12-15 DIAGNOSIS — C7931 Secondary malignant neoplasm of brain: Secondary | ICD-10-CM | POA: Diagnosis present

## 2023-12-15 DIAGNOSIS — E871 Hypo-osmolality and hyponatremia: Secondary | ICD-10-CM | POA: Diagnosis present

## 2023-12-15 DIAGNOSIS — C61 Malignant neoplasm of prostate: Secondary | ICD-10-CM | POA: Diagnosis present

## 2023-12-15 DIAGNOSIS — F17290 Nicotine dependence, other tobacco product, uncomplicated: Secondary | ICD-10-CM | POA: Diagnosis present

## 2023-12-15 LAB — COMPREHENSIVE METABOLIC PANEL WITH GFR
ALT: 7 U/L (ref 0–44)
AST: 11 U/L — ABNORMAL LOW (ref 15–41)
Albumin: 2.5 g/dL — ABNORMAL LOW (ref 3.5–5.0)
Alkaline Phosphatase: 419 U/L — ABNORMAL HIGH (ref 38–126)
Anion gap: 8 (ref 5–15)
BUN: 9 mg/dL (ref 8–23)
CO2: 24 mmol/L (ref 22–32)
Calcium: 8.4 mg/dL — ABNORMAL LOW (ref 8.9–10.3)
Chloride: 97 mmol/L — ABNORMAL LOW (ref 98–111)
Creatinine, Ser: 0.75 mg/dL (ref 0.61–1.24)
GFR, Estimated: >60 mL/min (ref >60)
Glucose, Bld: 95 mg/dL (ref 70–99)
Potassium: 3.8 mmol/L (ref 3.5–5.1)
Sodium: 129 mmol/L — ABNORMAL LOW (ref 135–145)
Total Bilirubin: 0.6 mg/dL (ref 0.0–1.2)
Total Protein: 6.1 g/dL — ABNORMAL LOW (ref 6.5–8.1)

## 2023-12-15 LAB — MAGNESIUM: Magnesium: 1.6 mg/dL — ABNORMAL LOW (ref 1.7–2.4)

## 2023-12-15 LAB — CBC
HCT: 24.8 % — ABNORMAL LOW (ref 39.0–52.0)
Hemoglobin: 8 g/dL — ABNORMAL LOW (ref 13.0–17.0)
MCH: 22.8 pg — ABNORMAL LOW (ref 26.0–34.0)
MCHC: 32.3 g/dL (ref 30.0–36.0)
MCV: 70.7 fL — ABNORMAL LOW (ref 80.0–100.0)
Platelets: 244 K/uL (ref 150–400)
RBC: 3.51 MIL/uL — ABNORMAL LOW (ref 4.22–5.81)
RDW: 16.7 % — ABNORMAL HIGH (ref 11.5–15.5)
WBC: 7 K/uL (ref 4.0–10.5)
nRBC: 0 % (ref 0.0–0.2)

## 2023-12-15 LAB — GLUCOSE, CAPILLARY
Glucose-Capillary: 102 mg/dL — ABNORMAL HIGH (ref 70–99)
Glucose-Capillary: 94 mg/dL (ref 70–99)
Glucose-Capillary: 115 mg/dL — ABNORMAL HIGH (ref 70–99)
Glucose-Capillary: 114 mg/dL — ABNORMAL HIGH (ref 70–99)

## 2023-12-15 LAB — FOLATE: Folate: 10.6 ng/mL (ref >5.9)

## 2023-12-15 LAB — PREALBUMIN: Prealbumin: <5 mg/dL — ABNORMAL LOW (ref 18–38)

## 2023-12-15 LAB — PSA: Prostatic Specific Antigen: 72.69 ng/mL — ABNORMAL HIGH (ref 0.00–4.00)

## 2023-12-15 LAB — PHOSPHORUS: Phosphorus: 2.4 mg/dL — ABNORMAL LOW (ref 2.5–4.6)

## 2023-12-15 LAB — VITAMIN B12: Vitamin B-12: 846 pg/mL (ref 180–914)

## 2023-12-15 MED ORDER — OXYCODONE HCL ER 20 MG PO T12A: 20.0000 mg | EXTENDED_RELEASE_TABLET | Freq: Two times a day (BID) | ORAL | Status: DC

## 2023-12-15 MED ORDER — MAGNESIUM SULFATE 2 GM/50ML IV SOLN
2.0000 g | Freq: Once | INTRAVENOUS | Status: AC
  Administered 2023-12-15: 2 g via INTRAVENOUS
  Filled 2023-12-15: qty 50

## 2023-12-15 MED ORDER — OXYCODONE HCL 5 MG PO TABS
10.0000 mg | ORAL_TABLET | ORAL | Status: DC | PRN
  Administered 2023-12-16 – 2023-12-19 (×16): 10 mg via ORAL
  Filled 2023-12-15 (×16): qty 2

## 2023-12-15 MED ORDER — ENSURE PLUS HIGH PROTEIN PO LIQD
237.0000 mL | Freq: Two times a day (BID) | ORAL | Status: DC
  Administered 2023-12-16 – 2023-12-19 (×7): 237 mL via ORAL

## 2023-12-15 MED ORDER — HYDRALAZINE HCL 20 MG/ML IJ SOLN: 10.0000 mg | Freq: Four times a day (QID) | INTRAMUSCULAR | Status: DC | PRN

## 2023-12-15 MED ORDER — SODIUM CHLORIDE 0.9 % IV SOLN: INTRAVENOUS | Status: AC

## 2023-12-15 MED ORDER — OXYCODONE HCL 5 MG PO TABS
5.0000 mg | ORAL_TABLET | ORAL | Status: DC | PRN
  Administered 2023-12-16 – 2023-12-19 (×2): 5 mg via ORAL
  Filled 2023-12-15 (×2): qty 1

## 2023-12-15 MED ORDER — K PHOS MONO-SOD PHOS DI & MONO 155-852-130 MG PO TABS
500.0000 mg | ORAL_TABLET | Freq: Two times a day (BID) | ORAL | Status: AC
  Administered 2023-12-15 (×2): 500 mg via ORAL
  Filled 2023-12-15 (×2): qty 2

## 2023-12-15 MED ORDER — HYDROMORPHONE HCL 1 MG/ML IJ SOLN
0.5000 mg | INTRAMUSCULAR | Status: DC | PRN
  Administered 2023-12-16 – 2023-12-17 (×7): 1 mg via INTRAVENOUS
  Filled 2023-12-15 (×9): qty 1

## 2023-12-15 MED ORDER — FERROUS SULFATE 325 (65 FE) MG PO TABS
325.0000 mg | ORAL_TABLET | Freq: Every day | ORAL | Status: DC
  Administered 2023-12-16 – 2023-12-19 (×4): 325 mg via ORAL
  Filled 2023-12-15 (×4): qty 1

## 2023-12-15 MED ORDER — LIDOCAINE 5 % EX PTCH
1.0000 | MEDICATED_PATCH | CUTANEOUS | Status: DC
  Administered 2023-12-15 – 2023-12-19 (×5): 1 via TRANSDERMAL
  Filled 2023-12-15 (×5): qty 1

## 2023-12-15 MED ORDER — OXYCODONE HCL ER 15 MG PO T12A
30.0000 mg | EXTENDED_RELEASE_TABLET | Freq: Two times a day (BID) | ORAL | Status: DC
  Administered 2023-12-15 – 2023-12-19 (×8): 30 mg via ORAL
  Filled 2023-12-15 (×8): qty 2

## 2023-12-15 MED ORDER — IRON SUCROSE 200 MG IVPB - SIMPLE MED
200.0000 mg | Freq: Once | Status: AC
  Administered 2023-12-15: 200 mg via INTRAVENOUS
  Filled 2023-12-15: qty 200

## 2023-12-15 NOTE — Assessment & Plan Note (Signed)
 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

## 2023-12-15 NOTE — TOC Progression Note (Signed)
 Transition of Care [facility]) - Progression Note    Patient Details  Name: Troy Mclaughlin MRN: [id number] Date of Birth: [birth year]  [facility]) CM/SW Contact  [name], RN Phone [phone]  12/15/2023, 3:55 PM  Clinical Narrative:    CM received message from OT requesting rolling walker. Note pending orders have been entered. Rolling walker ordered per [employer] to be delivered to bedside.                      Expected Discharge Plan and Services                                               Social Drivers of Health (SDOH) Interventions SDOH Screenings   Food Insecurity: Food Insecurity Present (12/14/2023)  Housing: Low Risk  (12/14/2023)  Transportation Needs: No Transportation Needs (12/14/2023)  Recent Concern: Transportation Needs - Unmet Transportation Needs (11/11/2023)  Utilities: Not At Risk (12/14/2023)  Alcohol  Screen: Low Risk  (12/25/2020)  Depression (PHQ2-9): Medium Risk (06/07/2023)  Financial Resource Strain: High Risk (05/05/2020)  Physical Activity: Insufficiently Active (12/25/2020)  Social Connections: Moderately Isolated (12/14/2023)  Stress: No Stress Concern Present (12/25/2020)  Tobacco Use: High Risk (12/14/2023)    Readmission Risk Interventions    11/10/2023    1:21 PM 07/19/2023   11:17 AM 07/18/2023    4:10 PM  Readmission Risk Prevention Plan  Transportation Screening Complete Complete Complete  PCP or Specialist Appt within 5-7 Days  Complete   Home Care Screening  Complete   Medication Review (RN CM)  Complete   Medication Review (RN Care Manager) Complete    PCP or Specialist appointment within 3-5 days of discharge Complete    HRI or Home Care Consult Complete    SW Recovery Care/Counseling Consult Complete    Palliative Care Screening Not Applicable    Skilled Nursing Facility Not Applicable

## 2023-12-15 NOTE — Assessment & Plan Note (Signed)
 Improved after replacement

## 2023-12-15 NOTE — Assessment & Plan Note (Signed)
Replacing with intravenous magnesium sulfate Monitoring magnesium levels with serial chemistries.

## 2023-12-15 NOTE — Plan of Care (Signed)
  Problem: Education: Goal: Ability to describe self-care measures that may prevent or decrease complications (Diabetes Survival Skills Education) will improve Outcome: Progressing   Problem: Coping: Goal: Ability to adjust to condition or change in health will improve Outcome: Progressing   Problem: Coping: Goal: Level of anxiety will decrease Outcome: Progressing   Problem: Pain Managment: Goal: General experience of comfort will improve and/or be controlled Outcome: Progressing

## 2023-12-15 NOTE — Evaluation (Signed)
 Physical Therapy One Time Evaluation Patient Details Name: Troy Mclaughlin MRN: [id number] DOB: 01/04/1956 Today's Date: 12/15/2023  History of Present Illness  Troy Mclaughlin is a 68 y.o. male presenting from cancer center and being admitted for uncontrolled pain with bone metastases from prostate cancer.  PMH: metastatic prostate CA, hyponatremia, HLD, HTN, diabetes, history of TIA, past history of alcohol  abuse difficult to control high blood pressure, GERD  Clinical Impression  Patient evaluated by Physical Therapy with no further acute PT needs identified. All education has been completed and the patient has no further questions.  Pt reports back pain however improved with pain meds since being admitted.  Pt ambulated in hallway and appears at his baseline.  Pt lives with spouse and has RW at home to use as needed. No further follow-up Physical Therapy or equipment needs. PT is signing off. Thank you for this referral.         If plan is discharge home, recommend the following:     Can travel by private vehicle        Equipment Recommendations None recommended by PT  Recommendations for Other Services       Functional Status Assessment Patient has not had a recent decline in their functional status     Precautions / Restrictions Precautions Precautions: Fall      Mobility  Bed Mobility Overal bed mobility: Modified Independent                  Transfers Overall transfer level: Needs assistance Equipment used: None Transfers: Sit to/from Stand Sit to Stand: Supervision, Contact guard assist                Ambulation/Gait Ambulation/Gait assistance: Contact guard assist, Supervision Gait Distance (Feet): 120 Feet Assistive device: IV Pole Gait Pattern/deviations: Step-through pattern, Decreased stride length       General Gait Details: pt preferred to push IV pole for a little more support (has RW at home to use as needed); SpO2 98% on room air  throughout gait  Stairs            Wheelchair Mobility     Tilt Bed    Modified Rankin (Stroke Patients Only)       Balance Overall balance assessment: History of Falls (no recent falls however)                                           Pertinent Vitals/Pain Pain Assessment Pain Assessment: Faces Faces Pain Scale: Hurts little more Pain Location: back Pain Descriptors / Indicators: Constant Pain Intervention(s): Premedicated before session, Monitored during session, Repositioned (from bony mets, pain has improved since pain meds in hospital)    Home Living Family/patient expects to be discharged to:: Private residence Living Arrangements: Spouse/significant other Available Help at Discharge: Family;Available 24 hours/day Type of Home: House Home Access: Stairs to enter   [employer] of Steps: 4   Home Layout: One level Home Equipment: Agricultural consultant (2 wheels)      Prior Function Prior Level of Function : Independent/Modified Independent             Mobility Comments: reports using RW when needed, has fx of falls but reports none recently       Extremity/Trunk Assessment        Lower Extremity Assessment Lower Extremity Assessment: Generalized weakness  Cervical / Trunk Assessment Cervical / Trunk Assessment: Normal  Communication   Communication Communication: No apparent difficulties    Cognition Arousal: Alert Behavior During Therapy: WFL for tasks assessed/performed   PT - Cognitive impairments: No apparent impairments                         Following commands: Intact       Cueing       General Comments      Exercises     Assessment/Plan    PT Assessment Patient does not need any further PT services  PT Problem List         PT Treatment Interventions      PT Goals (Current goals can be found in the Care Plan section)  Acute Rehab PT Goals PT Goal Formulation: All  assessment and education complete, DC therapy    Frequency       Co-evaluation               AM-PAC PT 6 Clicks Mobility  Outcome Measure Help needed turning from your back to your side while in a flat bed without using bedrails?: None Help needed moving from lying on your back to sitting on the side of a flat bed without using bedrails?: None Help needed moving to and from a bed to a chair (including a wheelchair)?: None Help needed standing up from a chair using your arms (e.g., wheelchair or bedside chair)?: None Help needed to walk in hospital room?: A Little Help needed climbing 3-5 steps with a railing? : A Little 6 Click Score: 22    End of Session   Activity Tolerance: Patient tolerated treatment well Patient left: in bed;with call bell/phone within reach Nurse Communication: Mobility status      Time: 1125-1141 PT Time Calculation (min) (ACUTE ONLY): 16 min   Charges:   PT Evaluation $PT Eval Low Complexity: 1 Low   PT General Charges $$ ACUTE PT VISIT: 1 Visit        Troy PT, DPT Physical Therapist Acute Rehabilitation Services Office: [phone]   Troy Mclaughlin 12/15/2023, 1:06 PM

## 2023-12-15 NOTE — Assessment & Plan Note (Signed)
 Continue Coreg  and Norvasc  Blood pressures not quite at target but are likely exacerbated due to pain As needed intravenous antihypertensives for markedly elevated blood pressure.

## 2023-12-15 NOTE — Assessment & Plan Note (Signed)
 Proceed with Pluvicto scheduled for 7/31 Follow-up in about 4 to 5 weeks in the clinic Will continue ADT as outpatient New baseline PSA

## 2023-12-15 NOTE — Progress Notes (Addendum)
 PROGRESS NOTE   Troy Mclaughlin  FMW:[id number] DOB: 1955/10/07 DOA: 12/14/2023 PCP: Oley Bascom RAMAN, NP   Date of Service: the patient was seen and examined on 12/15/2023  Brief Narrative:  68 year old male with past medical history of stage IV metastatic prostate cancer (Follows with Dr. Tina, on darolutamide ), ongoing cancer-related pain, anemia of chronic disease, coronary artery disease, hyperlipidemia, hypertension, non-insulin -dependent diabetes mellitus type 2, prior TIA, gastroesophageal reflux disease who presented [facility] emergency department on 7/24 with complaints of nausea, vomiting and diffuse worsening pain.  Upon evaluation in the emergency department patient was initiated on intravenous fluids and several doses of intravenous opiate-based analgesics.  CT imaging was performed revealing no progression of his malignancy.  Due to ongoing pain the hospitalist group was then called to assess the patient for admission to the hospital.   Assessment & Plan Cancer associated pain Patient continues to experience intractable cancer related pain, mostly localized to the back but radiating diffusely. Poorly managed pain speaks to a guarded prognosis Increasing scheduled OxyContin  to 30 mg twice daily based on frequent need for as needed analgesics As needed short acting oxycodone   Lidoderm  patch to lower back Monitor closely and continue to titrate until pain is managed well enough to be discharged home. Malignant neoplasm of prostate [facility]) Patient follows with Dr. Pauletta Tina who has evaluated the patient in consultation today, his input is appreciated. Poor prognosis considering continued severe symptoms After lengthy discussions with Dr. Tina, patient wishes to try Pluvicto with first dose scheduled on 7/31 If patient does not show significant response to Pluvicto plan is for patient to cease treatment and transition to hospice. Essential hypertension Continue Coreg  and  Norvasc  Blood pressures not quite at target but are likely exacerbated due to pain As needed intravenous antihypertensives for markedly elevated blood pressure. Coronary artery disease Chest pain-free Continue statin therapy and beta-blocker therapy Type 2 diabetes mellitus without complication, without long-term current use of insulin  (HCC) Patient been placed on Accu-Cheks before every meal and nightly with sliding scale insulin  Holding home regimen of hypoglycemics Hemoglobin A1C ordered Diabetic Diet  Protein-calorie malnutrition, severe Encourage oral intake Nutrition consultation Protein supplementation Mixed 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) Continue statin therapy Hyponatremia Likely secondary to poor oral intake Continue gentle intravenous hydration Monitor sodium levels with serial chemistries. Hypomagnesemia Replacing with intravenous magnesium  sulfate Monitoring magnesium  levels with serial chemistries.  Hypophosphatemia Replacing with oral Neutra-Phos. Microcytic anemia Multifactorial microcytic anemia with iron deficiency in the setting of anemia of chronic disease Anemia is worsened further during this hospitalization by hemodilution. Administering Venofer infusion followed by daily oral ferrous sulfate No evidence of clinical bleeding on evaluation Monitoring hemoglobin and hematocrit with serial chemistries     Subjective:  Patient continuing to complain of severe pain, localized to the low back, radiating diffusely, severe in intensity, minimal improvement with as needed opiate-based analgesics.  Physical Exam:  Vitals:   12/14/23 2326 12/14/23 2331 12/15/23 0253 12/15/23 0731  BP:  (!) 186/97 (!) 157/88 (!) 159/80  Pulse:  78 75 66  Resp:  18 18 16   Temp:  98.5 F (36.9 C) 98.6 F (37 C) 98.2 F (36.8 C)  TempSrc:    Oral  SpO2:  100% 95% 97%  Weight: 57.8 kg     Height: 5' 9 (1.753 m)        Constitutional: Awake alert and oriented x3, patient is in distress due to pain.  Patient is cachectic. Skin: no  rashes, no lesions, poor skin turgor noted. Eyes: Pupils are equally reactive to light.  No evidence of scleral icterus or conjunctival pallor.  ENMT: Moist mucous membranes noted.  Posterior pharynx clear of any exudate or lesions.   Respiratory: clear to auscultation bilaterally, no wheezing, no crackles. Normal respiratory effort. No accessory muscle use.  Cardiovascular: Regular rate and rhythm, no murmurs / rubs / gallops. No extremity edema. 2+ pedal pulses. No carotid bruits.  Abdomen: Abdomen is soft and nontender.  No evidence of intra-abdominal masses.  Positive bowel sounds noted in all quadrants.   Musculoskeletal: Notable midline lumbar tenderness without crepitus or deformity.  No joint deformity upper and lower extremities. Good ROM, no contractures.  Poor muscle tone.    Data Reviewed:  I have personally reviewed and interpreted labs, imaging.  Significant findings are   CBC: Recent Labs  Lab 12/14/23 1613 12/15/23 0552  WBC 8.5 7.0  HGB 9.8* 8.0*  HCT 30.1* 24.8*  MCV 70.0* 70.7*  PLT 283 244   Basic Metabolic Panel: Recent Labs  Lab 12/14/23 1613 12/14/23 2004 12/15/23 0552  NA 133* 129* 129*  K 3.5 3.3* 3.8  CL 95* 95* 97*  CO2 26 24 24   GLUCOSE 108* 111* 95  BUN 10 10 9   CREATININE 0.89 0.77 0.75  CALCIUM  9.2 8.4* 8.4*  MG  --  1.8 1.6*  PHOS  --  2.8 2.4*   GFR: Estimated Creatinine Clearance: 74.3 mL/min (by C-G formula based on SCr of 0.75 mg/dL). Liver Function Tests: Recent Labs  Lab 12/14/23 1613 12/15/23 0552  AST 14* 11*  ALT 7 7  ALKPHOS 600* 419*  BILITOT 0.5 0.6  PROT 7.9 6.1*  ALBUMIN 3.4* 2.5*     Code Status:  Full code.  Code status decision has been confirmed with: patient    Severity of Illness:  The appropriate patient status for this patient is INPATIENT. Inpatient status is judged to be  reasonable and necessary in order to provide the required intensity of service to ensure the patient's safety. The patient's presenting symptoms, physical exam findings, and initial radiographic and laboratory data in the context of their chronic comorbidities is felt to place them at high risk for further clinical deterioration. Furthermore, it is not anticipated that the patient will be medically stable for discharge from the hospital within 2 midnights of admission.   * I certify that at the point of admission it is my clinical judgment that the patient will require inpatient hospital care spanning beyond 2 midnights from the point of admission due to high intensity of service, high risk for further deterioration and high frequency of surveillance required.*  Time spent:  54 minutes  Author:  Zachary JINNY Ba MD  12/15/2023 8:13 AM

## 2023-12-15 NOTE — Assessment & Plan Note (Signed)
 Uncontrolled Previously discussed with patient.  Will need to continue medication once discharged Continue amlodipine  carvedilol 

## 2023-12-15 NOTE — Plan of Care (Signed)
  Problem: Metabolic: Goal: Ability to maintain appropriate glucose levels will improve Outcome: Progressing   Problem: Nutritional: Goal: Maintenance of adequate nutrition will improve Outcome: Progressing   Problem: Education: Goal: Knowledge of General Education information will improve Description: Including pain rating scale, medication(s)/side effects and non-pharmacologic comfort measures Outcome: Progressing   Problem: Clinical Measurements: Goal: Will remain free from infection Outcome: Progressing   Problem: Activity: Goal: Risk for activity intolerance will decrease Outcome: Progressing   Problem: Pain Managment: Goal: General experience of comfort will improve and/or be controlled Outcome: Progressing

## 2023-12-15 NOTE — Assessment & Plan Note (Signed)
 Replacing with oral Neutra-Phos
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
## 2023-12-15 NOTE — Assessment & Plan Note (Signed)
 Currently on scheduled OxyContin  and as needed Dilaudid  Appreciate hospitalist and palliative care support

## 2023-12-15 NOTE — Hospital Course (Addendum)
 35bn with h/o metastatic prostate CA (Follows with Dr. Tina, on darolutamide ), ongoing cancer-related pain, anemia of chronic disease, coronary artery disease, hyperlipidemia, hypertension, DM, and prior TIA who presented on 7/24 with complaints of nausea, vomiting and diffuse worsening pain.  He was admitted for pain control.  He is scheduled for a dose of Pluvicto  on 7/31; if this does not help he is recommended for transition to hospice.

## 2023-12-15 NOTE — Assessment & Plan Note (Signed)
 Likely secondary to poor oral intake Continue gentle intravenous hydration Monitor sodium levels with serial chemistries.

## 2023-12-15 NOTE — Assessment & Plan Note (Signed)
 Chest pain-free Continue statin therapy and beta-blocker therapy

## 2023-12-15 NOTE — Assessment & Plan Note (Signed)
.   Patient been placed on Accu-Cheks before every meal and nightly with sliding scale insulin . Holding home regimen of hypoglycemics . Hemoglobin A1C ordered . Diabetic Diet  

## 2023-12-15 NOTE — Assessment & Plan Note (Signed)
 Patient follows with Dr. Pauletta Chihuahua who has evaluated the patient in consultation today, his input is appreciated. Poor prognosis considering continued severe symptoms After lengthy discussions with Dr. Chihuahua, patient wishes to try Pluvicto with first dose scheduled on 7/31 If patient does not show significant response to Pluvicto plan is for patient to cease treatment and transition to hospice.

## 2023-12-15 NOTE — Assessment & Plan Note (Signed)
 Multifactorial microcytic anemia with iron deficiency in the setting of anemia of chronic disease Anemia is worsened further during this hospitalization by hemodilution. Administering Venofer infusion followed by daily oral ferrous sulfate No evidence of clinical bleeding on evaluation Monitoring hemoglobin and hematocrit with serial chemistries

## 2023-12-15 NOTE — Assessment & Plan Note (Signed)
 Continue insulin  per primary team

## 2023-12-15 NOTE — Assessment & Plan Note (Signed)
 Encourage oral intake Nutrition consultation Protein supplementation

## 2023-12-15 NOTE — Assessment & Plan Note (Signed)
 Patient continues to experience intractable cancer related pain, mostly localized to the back but radiating diffusely. Poorly managed pain speaks to a guarded prognosis Increasing scheduled OxyContin  to 30 mg twice daily based on frequent need for as needed analgesics As needed short acting oxycodone   Lidoderm  patch to lower back Monitor closely and continue to titrate until pain is managed well enough to be discharged home.

## 2023-12-15 NOTE — Assessment & Plan Note (Signed)
 Continue statin therapy.

## 2023-12-15 NOTE — Assessment & Plan Note (Signed)
 Continue atorvastatin 

## 2023-12-16 DIAGNOSIS — G893 Neoplasm related pain (acute) (chronic): Secondary | ICD-10-CM | POA: Diagnosis not present

## 2023-12-16 LAB — CBC WITH DIFFERENTIAL/PLATELET
Abs Immature Granulocytes: 0.02 K/uL (ref 0.00–0.07)
Basophils Absolute: 0 K/uL (ref 0.0–0.1)
Basophils Relative: 0 %
Eosinophils Absolute: 0.1 K/uL (ref 0.0–0.5)
Eosinophils Relative: 2 %
HCT: 23.4 % — ABNORMAL LOW (ref 39.0–52.0)
Hemoglobin: 7.6 g/dL — ABNORMAL LOW (ref 13.0–17.0)
Immature Granulocytes: 1 %
Lymphocytes Relative: 11 %
Lymphs Abs: 0.4 K/uL — ABNORMAL LOW (ref 0.7–4.0)
MCH: 23.1 pg — ABNORMAL LOW (ref 26.0–34.0)
MCHC: 32.5 g/dL (ref 30.0–36.0)
MCV: 71.1 fL — ABNORMAL LOW (ref 80.0–100.0)
Monocytes Absolute: 0.4 K/uL (ref 0.1–1.0)
Monocytes Relative: 12 %
Neutro Abs: 2.8 K/uL (ref 1.7–7.7)
Neutrophils Relative %: 74 %
Platelets: 237 K/uL (ref 150–400)
RBC: 3.29 MIL/uL — ABNORMAL LOW (ref 4.22–5.81)
RDW: 16.7 % — ABNORMAL HIGH (ref 11.5–15.5)
WBC: 3.7 K/uL — ABNORMAL LOW (ref 4.0–10.5)
nRBC: 0 % (ref 0.0–0.2)

## 2023-12-16 LAB — COMPREHENSIVE METABOLIC PANEL WITH GFR
ALT: 6 U/L (ref 0–44)
AST: 10 U/L — ABNORMAL LOW (ref 15–41)
Albumin: 2.2 g/dL — ABNORMAL LOW (ref 3.5–5.0)
Alkaline Phosphatase: 356 U/L — ABNORMAL HIGH (ref 38–126)
Anion gap: 7 (ref 5–15)
BUN: 8 mg/dL (ref 8–23)
CO2: 25 mmol/L (ref 22–32)
Calcium: 8.1 mg/dL — ABNORMAL LOW (ref 8.9–10.3)
Chloride: 102 mmol/L (ref 98–111)
Creatinine, Ser: 0.64 mg/dL (ref 0.61–1.24)
GFR, Estimated: >60 mL/min (ref >60)
Glucose, Bld: 85 mg/dL (ref 70–99)
Potassium: 4 mmol/L (ref 3.5–5.1)
Sodium: 134 mmol/L — ABNORMAL LOW (ref 135–145)
Total Bilirubin: 0.5 mg/dL (ref 0.0–1.2)
Total Protein: 5.7 g/dL — ABNORMAL LOW (ref 6.5–8.1)

## 2023-12-16 LAB — MAGNESIUM: Magnesium: 1.7 mg/dL (ref 1.7–2.4)

## 2023-12-16 LAB — PHOSPHORUS: Phosphorus: 3.4 mg/dL (ref 2.5–4.6)

## 2023-12-16 LAB — GLUCOSE, CAPILLARY
Glucose-Capillary: 89 mg/dL (ref 70–99)
Glucose-Capillary: 93 mg/dL (ref 70–99)
Glucose-Capillary: 93 mg/dL (ref 70–99)
Glucose-Capillary: 89 mg/dL (ref 70–99)

## 2023-12-16 MED ORDER — BISACODYL 5 MG PO TBEC
5.0000 mg | DELAYED_RELEASE_TABLET | Freq: Every day | ORAL | Status: DC | PRN
  Administered 2023-12-17 – 2023-12-18 (×2): 5 mg via ORAL
  Filled 2023-12-16 (×2): qty 1

## 2023-12-16 MED ORDER — POLYETHYLENE GLYCOL 3350 17 G PO PACK
17.0000 g | PACK | Freq: Every day | ORAL | Status: DC
  Administered 2023-12-16 – 2023-12-19 (×4): 17 g via ORAL
  Filled 2023-12-16 (×4): qty 1

## 2023-12-16 MED ORDER — DOCUSATE SODIUM 100 MG PO CAPS
100.0000 mg | ORAL_CAPSULE | Freq: Two times a day (BID) | ORAL | Status: DC
  Administered 2023-12-16 – 2023-12-19 (×6): 100 mg via ORAL
  Filled 2023-12-16 (×6): qty 1

## 2023-12-16 NOTE — Progress Notes (Signed)
 Mobility Specialist - Progress Note   12/16/23 1333  Mobility  Activity Ambulated with assistance in hallway  Level of Assistance Standby assist, set-up cues, supervision of patient - no hands on  Assistive Device Other (Comment) (IV Pole , Hallway Rail)  Distance Ambulated (ft) 100 ft  Range of Motion/Exercises Active  Activity Response Tolerated well  Mobility Referral Yes  Mobility visit 1 Mobility  Mobility Specialist Start Time (ACUTE ONLY) 1322  Mobility Specialist Stop Time (ACUTE ONLY) 1333  Mobility Specialist Time Calculation (min) (ACUTE ONLY) 11 min   Pt was found in bed and agreeable to ambulate. No complaints during session. At EOS returned to bed with all needs met. Call bell in reach.  Troy Mclaughlin,  Mobility Specialist Can be reached via Secure Chat

## 2023-12-16 NOTE — Progress Notes (Signed)
 Progress Note   Patient: Troy Mclaughlin FMW:[id number] DOB: 03/01/1956 DOA: 12/14/2023     1 DOS: the patient was seen and examined on 12/16/2023   Brief hospital course: 68yo with h/o metastatic prostate CA (Follows with Dr. Tina, on darolutamide ), ongoing cancer-related pain, anemia of chronic disease, coronary artery disease, hyperlipidemia, hypertension, DM, and prior TIA who presented on 7/24 with complaints of nausea, vomiting and diffuse worsening pain.  He was admitted for pain control.  He is scheduled for a dose of Pluvicto  on 7/31; if this does not help he is recommended for transition to hospice.  Assessment and Plan:  Cancer associated pain Patient continues to experience intractable cancer related pain, mostly localized to the back but radiating diffusely Poorly managed pain speaks to a guarded prognosis Increasing scheduled OxyContin  to 30 mg twice daily based on frequent need for as needed analgesics As needed short acting oxycodone   Lidoderm  patch to lower back Monitor closely and continue to titrate until pain is managed well enough to be discharged home Needs good bowel regimen  Malignant neoplasm of prostate [facility]) Patient follows with Dr. Tina who is consulting, his input is appreciated Poor prognosis considering continued severe symptoms After lengthy discussions with Dr. Tina, patient wishes to try Pluvicto  with first dose scheduled on 7/31 If patient does not show significant response to Pluvicto  plan is for patient to cease treatment and transition to hospice He has been offered hospice now and declines GOC discussed and he is considering DNR but is not yet ready to change his status  Essential hypertension Continue Coreg  and Norvasc  Blood pressures are at goal As needed intravenous antihypertensives for markedly elevated blood pressure  Coronary artery disease Chest pain-free Continue statin therapy and beta-blocker therapy  Type 2 diabetes mellitus  without complication, without long-term current use of insulin   A1c 6.1 at last check, very good control  Cover with moderate-scale SSI Carb modified diet  Holding home regimen of hypoglycemics   Protein-calorie malnutrition, severe Encourage oral intake Protein supplementation  Mixed diabetic hyperlipidemia associated with type 2 diabetes mellitus  Continue statin therapy  Hyponatremia Likely secondary to poor oral intake Significantly improved  Microcytic anemia Multifactorial microcytic anemia with iron  deficiency in the setting of anemia of chronic disease Administering Venofer  infusion followed by daily oral ferrous sulfate  No evidence of clinical bleeding on evaluation Recheck in AM      Consultants: Oncology PT OT TOC team  Procedures: None  Antibiotics: None  30 Day Unplanned Readmission Risk Score    Flowsheet Row ED to Hosp-Admission (Current) from 12/14/2023 in [place] [facility]  30 Day Unplanned Readmission Risk Score (%) 28.82 Filed at 12/16/2023 0400    This score is the patient's risk of an unplanned readmission within 30 days of being discharged (0 -100%). The score is based on dignosis, age, lab data, medications, orders, and past utilization.   Low:  0-14.9   Medium: 15-21.9   High: 22-29.9   Extreme: 30 and above           Subjective: Pain control is improving.  He is still not eating well and needs to have a BM.  Thinks he will be ready to dc on 7/27.   Objective: Vitals:   12/16/23 0552 12/16/23 1243  BP: 134/76 129/80  Pulse: 60 69  Resp: 20 16  Temp: 98.3 F (36.8 C) 98.3 F (36.8 C)  SpO2: 95% 95%    Intake/Output Summary (Last 24 hours) at 12/16/2023 1542  Last data filed at 12/16/2023 1243 Gross per 24 hour  Intake 379.01 ml  Output 675 ml  Net -295.99 ml   Filed Weights   12/14/23 1513 12/14/23 2326  Weight: 59.9 kg 57.8 kg    Exam:  General:  Appears calm and comfortable and is in NAD Eyes:   normal  lids, iris ENT:  grossly normal hearing, lips & tongue, mmm Cardiovascular:  RRR. No LE edema.  Respiratory:   CTA bilaterally with no wheezes/rales/rhonchi.  Normal respiratory effort. Abdomen:  soft, NT, ND; prior significant abdominal scars from trauma Skin:  no rash or induration seen on limited exam Musculoskeletal:  grossly normal tone BUE/BLE, good ROM, no bony abnormality Psychiatric:  grossly normal mood and affect, speech fluent and appropriate, AOx3 Neurologic:  CN 2-12 grossly intact, moves all extremities in coordinated fashion  Data Reviewed: I have reviewed the patient's lab results since admission.  Pertinent labs for today include:   Na++ 134, improved and not clinically significant AP 356, down from 419 Albumin 2.2 WBC 3.7 Hgb 7.6 PSA 72.69    Family Communication: None present  Disposition: Status is: Inpatient Remains inpatient appropriate because: ongoing monitoring     Time spent: 50 minutes  Unresulted Labs (From admission, onward)     Start     Ordered   12/17/23 0500  CBC with Differential/Platelet  Tomorrow morning,   R        12/16/23 1542   12/17/23 0500  Basic metabolic panel with GFR  Tomorrow morning,   R        12/16/23 1542             Author: Delon Herald, MD 12/16/2023 3:42 PM  For on call review [email or web address].

## 2023-12-17 DIAGNOSIS — G893 Neoplasm related pain (acute) (chronic): Secondary | ICD-10-CM | POA: Diagnosis not present

## 2023-12-17 LAB — GLUCOSE, CAPILLARY
Glucose-Capillary: 70 mg/dL (ref 70–99)
Glucose-Capillary: 82 mg/dL (ref 70–99)
Glucose-Capillary: 97 mg/dL (ref 70–99)
Glucose-Capillary: 89 mg/dL (ref 70–99)

## 2023-12-17 LAB — CBC WITH DIFFERENTIAL/PLATELET
Abs Immature Granulocytes: 0.01 K/uL (ref 0.00–0.07)
Basophils Absolute: 0 K/uL (ref 0.0–0.1)
Basophils Relative: 0 %
Eosinophils Absolute: 0.2 K/uL (ref 0.0–0.5)
Eosinophils Relative: 4 %
HCT: 24 % — ABNORMAL LOW (ref 39.0–52.0)
Hemoglobin: 7.8 g/dL — ABNORMAL LOW (ref 13.0–17.0)
Immature Granulocytes: 0 %
Lymphocytes Relative: 9 %
Lymphs Abs: 0.4 K/uL — ABNORMAL LOW (ref 0.7–4.0)
MCH: 23.1 pg — ABNORMAL LOW (ref 26.0–34.0)
MCHC: 32.5 g/dL (ref 30.0–36.0)
MCV: 71 fL — ABNORMAL LOW (ref 80.0–100.0)
Monocytes Absolute: 0.6 K/uL (ref 0.1–1.0)
Monocytes Relative: 13 %
Neutro Abs: 3 K/uL (ref 1.7–7.7)
Neutrophils Relative %: 74 %
Platelets: 253 K/uL (ref 150–400)
RBC: 3.38 MIL/uL — ABNORMAL LOW (ref 4.22–5.81)
RDW: 16.4 % — ABNORMAL HIGH (ref 11.5–15.5)
WBC: 4.1 K/uL (ref 4.0–10.5)
nRBC: 0 % (ref 0.0–0.2)

## 2023-12-17 LAB — BASIC METABOLIC PANEL WITH GFR
Anion gap: 11 (ref 5–15)
BUN: 6 mg/dL — ABNORMAL LOW (ref 8–23)
CO2: 24 mmol/L (ref 22–32)
Calcium: 8.3 mg/dL — ABNORMAL LOW (ref 8.9–10.3)
Chloride: 96 mmol/L — ABNORMAL LOW (ref 98–111)
Creatinine, Ser: 0.53 mg/dL — ABNORMAL LOW (ref 0.61–1.24)
GFR, Estimated: >60 mL/min (ref >60)
Glucose, Bld: 74 mg/dL (ref 70–99)
Potassium: 3.5 mmol/L (ref 3.5–5.1)
Sodium: 131 mmol/L — ABNORMAL LOW (ref 135–145)

## 2023-12-17 NOTE — Progress Notes (Signed)
 Mobility Specialist - Progress Note   12/17/23 0956  Mobility  Activity Ambulated with assistance in hallway  Level of Assistance Modified independent, requires aide device or extra time  Assistive Device Other (Comment) (IV Pole, HHA)  Distance Ambulated (ft) 230 ft  Activity Response Tolerated well  Mobility Referral Yes  Mobility visit 1 Mobility  Mobility Specialist Start Time (ACUTE ONLY) [id number]  Mobility Specialist Stop Time (ACUTE ONLY) 0955  Mobility Specialist Time Calculation (min) (ACUTE ONLY) 17 min   Pt received in bed and agreeable to mobility. No complaints during session. Held onto hallway rails during session. Pt to bed after session with all needs met. RN in room.  [facility]

## 2023-12-17 NOTE — Plan of Care (Signed)
   Problem: Education: Goal: Ability to describe self-care measures that may prevent or decrease complications (Diabetes Survival Skills Education) will improve Outcome: Progressing Goal: Individualized Educational Video(s) Outcome: Progressing

## 2023-12-17 NOTE — Progress Notes (Signed)
 Progress Note   Patient: Troy Mclaughlin FMW:[id number] DOB: May 27, 1955 DOA: 12/14/2023     2 DOS: the patient was seen and examined on 12/17/2023   Brief hospital course: 68yo with h/o metastatic prostate CA (Follows with Dr. Tina, on darolutamide ), ongoing cancer-related pain, anemia of chronic disease, coronary artery disease, hyperlipidemia, hypertension, DM, and prior TIA who presented on 7/24 with complaints of nausea, vomiting and diffuse worsening pain.  He was admitted for pain control.  He is scheduled for a dose of Pluvicto  on 7/31; if this does not help he is recommended for transition to hospice.  Assessment and Plan:  Cancer associated pain Patient continues to experience intractable cancer related pain, mostly localized to the back but radiating diffusely Poorly managed pain speaks to a guarded prognosis Increased scheduled OxyContin  to 30 mg twice daily based on frequent need for as needed analgesics As needed short acting oxycodone   Lidoderm  patch to lower back Will stop parenteral opiates (Dilaudid ) and see if pain can be controlled without IV medication Needs good bowel regimen   Malignant neoplasm of prostate  Patient follows with Dr. Tina who is consulting, his input is appreciated Poor prognosis considering continued severe symptoms After lengthy discussions with Dr. Tina, patient wishes to try Pluvicto  with first dose scheduled on 7/31 If patient does not show significant response to Pluvicto  plan is for patient to cease treatment and transition to hospice He has been offered hospice now and declines GOC discussed and he is considering DNR but is not yet ready to change his status   Essential hypertension Continue Coreg  and Norvasc  Blood pressures are at goal As needed intravenous antihypertensives for markedly elevated blood pressure   Coronary artery disease Chest pain-free Continue statin therapy and beta-blocker therapy   Type 2 diabetes mellitus  without complication, without long-term current use of insulin   A1c 6.1 at last check, very good control  Cover with moderate-scale SSI Carb modified diet  Holding home regimen of hypoglycemics   Protein-calorie malnutrition, severe Encourage oral intake Protein supplementation   Mixed diabetic hyperlipidemia associated with type 2 diabetes mellitus  Continue statin therapy   Hyponatremia Likely secondary to poor oral intake Stable   Microcytic anemia Multifactorial microcytic anemia with iron  deficiency in the setting of anemia of chronic disease Administered Venofer  infusion followed by daily oral ferrous sulfate  No evidence of clinical bleeding on evaluation Recheck in AM         Consultants: Oncology PT OT TOC team   Procedures: None   Antibiotics: None   30 Day Unplanned Readmission Risk Score    Flowsheet Row ED to Hosp-Admission (Current) from 12/14/2023 in [place] [facility]  30 Day Unplanned Readmission Risk Score (%) 30.02 Filed at 12/17/2023 0801    This score is the patient's risk of an unplanned readmission within 30 days of being discharged (0 -100%). The score is based on dignosis, age, lab data, medications, orders, and past utilization.   Low:  0-14.9   Medium: 15-21.9   High: 22-29.9   Extreme: 30 and above           Subjective: Still having some pain, has not yet had a BM, doesn't feel ready to go home.  On both days that I have seen him, he has made reference to but I'm not homeless.   Objective: Vitals:   12/16/23 1955 12/17/23 0537  BP: (!) 140/77 (!) 147/93  Pulse: 64 66  Resp: 16 16  Temp: 98.1 F (36.7  C) 98.4 F (36.9 C)  SpO2: 96% 95%    Intake/Output Summary (Last 24 hours) at 12/17/2023 1152 Last data filed at 12/16/2023 2243 Gross per 24 hour  Intake --  Output 750 ml  Net -750 ml   Filed Weights   12/14/23 1513 12/14/23 2326  Weight: 59.9 kg 57.8 kg    Exam:  General:  Appears calm and  comfortable and is in NAD Eyes:   normal lids, iris ENT:  grossly normal hearing, lips & tongue, mmm Cardiovascular:  RRR. No LE edema.  Respiratory:   CTA bilaterally with no wheezes/rales/rhonchi.  Normal respiratory effort. Abdomen:  soft, NT, ND; prior significant abdominal scars from trauma Skin:  no rash or induration seen on limited exam Musculoskeletal:  grossly normal tone BUE/BLE, good ROM, no bony abnormality Psychiatric:  grossly normal mood and affect, speech fluent and appropriate, AOx3 Neurologic:  CN 2-12 grossly intact, moves all extremities in coordinated fashion   Data Reviewed: I have reviewed the patient's lab results since admission.  Pertinent labs for today include:   Na++ 131 WBC 4.1 Hgb 7.8     Family Communication: None present  Disposition: Status is: Inpatient Remains inpatient appropriate because: ongoing pain control     Time spent: 50 minutes  Unresulted Labs (From admission, onward)     Start     Ordered   12/18/23 0500  CBC with Differential/Platelet  Tomorrow morning,   R        12/17/23 1152   12/18/23 0500  Basic metabolic panel with GFR  Tomorrow morning,   R        12/17/23 1152             Author: Delon Herald, MD 12/17/2023 11:52 AM  For on call review [email or web address].

## 2023-12-18 ENCOUNTER — Other Ambulatory Visit (HOSPITAL_COMMUNITY): Payer: Self-pay | Admitting: Internal Medicine

## 2023-12-18 ENCOUNTER — Telehealth (HOSPITAL_COMMUNITY): Payer: Self-pay

## 2023-12-18 ENCOUNTER — Telehealth (HOSPITAL_COMMUNITY): Payer: Self-pay | Admitting: Internal Medicine

## 2023-12-18 DIAGNOSIS — D509 Iron deficiency anemia, unspecified: Secondary | ICD-10-CM

## 2023-12-18 LAB — CBC WITH DIFFERENTIAL/PLATELET
Abs Immature Granulocytes: 0.08 K/uL — ABNORMAL HIGH (ref 0.00–0.07)
Basophils Absolute: 0 K/uL (ref 0.0–0.1)
Basophils Relative: 0 %
Eosinophils Absolute: 0.2 K/uL (ref 0.0–0.5)
Eosinophils Relative: 4 %
HCT: 26.9 % — ABNORMAL LOW (ref 39.0–52.0)
Hemoglobin: 8.8 g/dL — ABNORMAL LOW (ref 13.0–17.0)
Immature Granulocytes: 2 %
Lymphocytes Relative: 6 %
Lymphs Abs: 0.3 K/uL — ABNORMAL LOW (ref 0.7–4.0)
MCH: 22.9 pg — ABNORMAL LOW (ref 26.0–34.0)
MCHC: 32.7 g/dL (ref 30.0–36.0)
MCV: 69.9 fL — ABNORMAL LOW (ref 80.0–100.0)
Monocytes Absolute: 0.6 K/uL (ref 0.1–1.0)
Monocytes Relative: 11 %
Neutro Abs: 4.1 K/uL (ref 1.7–7.7)
Neutrophils Relative %: 77 %
Platelets: 319 K/uL (ref 150–400)
RBC: 3.85 MIL/uL — ABNORMAL LOW (ref 4.22–5.81)
RDW: 16.4 % — ABNORMAL HIGH (ref 11.5–15.5)
WBC: 5.3 K/uL (ref 4.0–10.5)
nRBC: 0 % (ref 0.0–0.2)

## 2023-12-18 LAB — BASIC METABOLIC PANEL WITH GFR
Anion gap: 12 (ref 5–15)
BUN: 9 mg/dL (ref 8–23)
CO2: 24 mmol/L (ref 22–32)
Calcium: 8.7 mg/dL — ABNORMAL LOW (ref 8.9–10.3)
Chloride: 94 mmol/L — ABNORMAL LOW (ref 98–111)
Creatinine, Ser: 0.69 mg/dL (ref 0.61–1.24)
GFR, Estimated: >60 mL/min (ref >60)
Glucose, Bld: 85 mg/dL (ref 70–99)
Potassium: 3.6 mmol/L (ref 3.5–5.1)
Sodium: 130 mmol/L — ABNORMAL LOW (ref 135–145)

## 2023-12-18 LAB — GLUCOSE, CAPILLARY
Glucose-Capillary: 82 mg/dL (ref 70–99)
Glucose-Capillary: 87 mg/dL (ref 70–99)
Glucose-Capillary: 104 mg/dL — ABNORMAL HIGH (ref 70–99)
Glucose-Capillary: 85 mg/dL (ref 70–99)

## 2023-12-18 MED ORDER — LACTULOSE 10 GM/15ML PO SOLN
20.0000 g | Freq: Two times a day (BID) | ORAL | Status: DC
  Administered 2023-12-18 – 2023-12-19 (×3): 20 g via ORAL
  Filled 2023-12-18 (×3): qty 30

## 2023-12-18 NOTE — Progress Notes (Signed)
 Progress Note   Patient: Troy Mclaughlin FMW:[id number] DOB: [birth year] DOA: 12/14/2023     3 DOS: the patient was seen and examined on 12/18/2023   Brief hospital course: 68yo with h/o metastatic prostate CA (Follows with Dr. Tina, on darolutamide ), ongoing cancer-related pain, anemia of chronic disease, coronary artery disease, hyperlipidemia, hypertension, DM, and prior TIA who presented on 7/24 with complaints of nausea, vomiting and diffuse worsening pain.  He was admitted for pain control.  He is scheduled for a dose of Pluvicto  on 7/31; if this does not help he is recommended for transition to hospice.  Assessment and Plan:  Cancer associated pain Patient continues to experience intractable cancer related pain, mostly localized to the back but radiating diffusely Poorly managed pain speaks to a guarded prognosis Increased scheduled OxyContin  to 30 mg twice daily based on frequent need for as needed analgesics As needed short acting oxycodone   Lidoderm  patch to lower back Stopped parenteral opiates (Dilaudid ) and see if pain can be controlled without IV medication Needs good bowel regimen, patient states that he has not had a bowel movement in the past 4 days.  Added lactulose  in addition to daily MiraLAX .  Patient is also on Colace.    Malignant neoplasm of prostate  Patient follows with Dr. Tina who is consulting, his input is appreciated Poor prognosis considering continued severe symptoms After lengthy discussions with Dr. Tina, patient wishes to try Pluvicto  with first dose scheduled on 7/31 If patient does not show significant response to Pluvicto  plan is for patient to cease treatment and transition to hospice He has been offered hospice now and declines GOC discussed and he is considering DNR but is not yet ready to change his status   Essential hypertension Continue Coreg  and Norvasc  Blood pressures are at goal As needed intravenous antihypertensives for markedly  elevated blood pressure   Coronary artery disease Chest pain-free Continue statin therapy and beta-blocker therapy   Type 2 diabetes mellitus without complication, without long-term current use of insulin   A1c 6.1 at last check, very good control  Cover with moderate-scale SSI Carb modified diet  Holding home regimen of hypoglycemics   Protein-calorie malnutrition, severe Encourage oral intake Protein supplementation   Mixed diabetic hyperlipidemia associated with type 2 diabetes mellitus  Continue statin therapy   Hyponatremia Likely secondary to poor oral intake Stable   Microcytic anemia Multifactorial microcytic anemia with iron  deficiency in the setting of anemia of chronic disease Administered Venofer  infusion followed by daily oral ferrous sulfate  No evidence of clinical bleeding on evaluation Recheck in AM         Consultants: Oncology PT OT TOC team   Procedures: None   Antibiotics: None   30 Day Unplanned Readmission Risk Score    Flowsheet Row ED to Hosp-Admission (Current) from 12/14/2023 in [place] [facility]  30 Day Unplanned Readmission Risk Score (%) 30.02 Filed at 12/17/2023 0801    This score is the patient's risk of an unplanned readmission within 30 days of being discharged (0 -100%). The score is based on dignosis, age, lab data, medications, orders, and past utilization.   Low:  0-14.9   Medium: 15-21.9   High: 22-29.9   Extreme: 30 and above           Subjective:   Patient continues to report having issues with the his pain especially  the back pain.  Also reports that he does not want to go home until he has a bowel movement,  he has not had a BM in the past 4 days or so.  Objective: Vitals:   12/18/23 1646 12/18/23 1648  BP: (!) 179/96 (!) 179/96  Pulse: 75 75  Resp:    Temp:    SpO2:      Intake/Output Summary (Last 24 hours) at 12/18/2023 1704 Last data filed at 12/18/2023 0756 Gross per 24 hour  Intake 240 ml   Output 740 ml  Net -500 ml   Filed Weights   12/14/23 1513 12/14/23 2326  Weight: 59.9 kg 57.8 kg    Exam:  General:  Appears calm and comfortable and is in NAD Eyes:   normal lids, iris ENT:  grossly normal hearing, lips & tongue, mmm Cardiovascular:  RRR. No LE edema.  Respiratory:   CTA bilaterally with no wheezes/rales/rhonchi.  Normal respiratory effort. Abdomen:  soft, NT, ND; prior significant abdominal scars from trauma Skin:  no rash or induration seen on limited exam Musculoskeletal:  grossly normal tone BUE/BLE, good ROM, no bony abnormality Psychiatric:  grossly normal mood and affect, speech fluent and appropriate, AOx3 Neurologic:  CN 2-12 grossly intact, moves all extremities in coordinated fashion   Data Reviewed: I have reviewed the patient's lab results since admission.      Family Communication: Discussed with brother.  Disposition: Status is: Inpatient Remains inpatient appropriate because: ongoing pain control     Time spent: 50 minutes  Unresulted Labs (From admission, onward)    None        Author: MARGARETMARY DELENA HALEY, MD 12/18/2023 5:04 PM  For on call review [email or web address].

## 2023-12-18 NOTE — Telephone Encounter (Signed)
 Patient referred to infusion pharmacy team for ambulatory infusion of IV iron .  Insurance - [employer] Medicare  Site of care - Site of care: [facility] Dx code - D63.8/D50.9 IV Iron  Therapy - Feraheme 510 mg IV x 2. Patient received Venofer  200 mg IV x 1 inpatient on 7/26.  Infusion appointments - Scheduling team will schedule patient as soon as possible.    Jordon Bourquin D. Stepahnie Campo, PharmD

## 2023-12-18 NOTE — Telephone Encounter (Signed)
 Auth Submission: NO AUTH NEEDED Site of care: Site of care: [facility] Payer: [employer] Medicare Medication & CPT/J Code(s) submitted: Feraheme (ferumoxytol) [id number] Diagnosis Code: D50.9/D63.8 Route of submission (phone, fax, portal):  Phone # Fax # Auth type: Buy/Bill HB Units/visits requested: 510mg  x 2 dose Reference number:  Approval from: 12/18/23 to 05/22/24

## 2023-12-18 NOTE — Plan of Care (Signed)
  Problem: Education: Goal: Ability to describe self-care measures that may prevent or decrease complications (Diabetes Survival Skills Education) will improve Outcome: Progressing   Problem: Coping: Goal: Ability to adjust to condition or change in health will improve Outcome: Progressing   Problem: Fluid Volume: Goal: Ability to maintain a balanced intake and output will improve Outcome: Progressing   Problem: Health Behavior/Discharge Planning: Goal: Ability to identify and utilize available resources and services will improve Outcome: Progressing Goal: Ability to manage health-related needs will improve Outcome: Progressing   Problem: Skin Integrity: Goal: Risk for impaired skin integrity will decrease Outcome: Progressing   Problem: Education: Goal: Knowledge of General Education information will improve Description: Including pain rating scale, medication(s)/side effects and non-pharmacologic comfort measures Outcome: Progressing   Problem: Health Behavior/Discharge Planning: Goal: Ability to manage health-related needs will improve Outcome: Progressing   Problem: Clinical Measurements: Goal: Ability to maintain clinical measurements within normal limits will improve Outcome: Progressing Goal: Will remain free from infection Outcome: Progressing Goal: Diagnostic test results will improve Outcome: Progressing Goal: Cardiovascular complication will be avoided Outcome: Progressing   Problem: Activity: Goal: Risk for activity intolerance will decrease Outcome: Progressing   Problem: Coping: Goal: Level of anxiety will decrease Outcome: Progressing   Problem: Elimination: Goal: Will not experience complications related to bowel motility Outcome: Progressing   Problem: Pain Managment: Goal: General experience of comfort will improve and/or be controlled Outcome: Progressing   Problem: Safety: Goal: Ability to remain free from injury will improve Outcome:  Progressing

## 2023-12-19 ENCOUNTER — Other Ambulatory Visit (HOSPITAL_COMMUNITY): Payer: Self-pay

## 2023-12-19 DIAGNOSIS — G893 Neoplasm related pain (acute) (chronic): Secondary | ICD-10-CM | POA: Diagnosis not present

## 2023-12-19 LAB — GLUCOSE, CAPILLARY
Glucose-Capillary: 102 mg/dL — ABNORMAL HIGH (ref 70–99)
Glucose-Capillary: 118 mg/dL — ABNORMAL HIGH (ref 70–99)
Glucose-Capillary: 115 mg/dL — ABNORMAL HIGH (ref 70–99)

## 2023-12-19 MED ORDER — FERROUS SULFATE 325 (65 FE) MG PO TABS
325.0000 mg | ORAL_TABLET | Freq: Every day | ORAL | 3 refills | Status: DC
  Filled 2023-12-19: qty 30, 30d supply, fill #0

## 2023-12-19 MED ORDER — ENSURE PLUS HIGH PROTEIN PO LIQD
237.0000 mL | Freq: Two times a day (BID) | ORAL | 1 refills | Status: DC
Start: 2023-12-20 — End: 2024-03-28
  Filled 2023-12-19: qty 1000, 2d supply, fill #0

## 2023-12-19 MED ORDER — BISACODYL 5 MG PO TBEC
5.0000 mg | DELAYED_RELEASE_TABLET | Freq: Every day | ORAL | 0 refills | Status: DC | PRN
  Filled 2023-12-19: qty 30, 30d supply, fill #0

## 2023-12-19 MED ORDER — ALUM & MAG HYDROXIDE-SIMETH 200-200-20 MG/5ML PO SUSP
30.0000 mL | ORAL | Status: DC | PRN
  Administered 2023-12-19 (×2): 30 mL via ORAL
  Filled 2023-12-19 (×2): qty 30

## 2023-12-19 MED ORDER — LACTULOSE 10 GM/15ML PO SOLN
20.0000 g | Freq: Two times a day (BID) | ORAL | 0 refills | Status: DC
  Filled 2023-12-19: qty 236, 4d supply, fill #0
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
## 2023-12-19 NOTE — Plan of Care (Signed)
   Problem: Education: Goal: Ability to describe self-care measures that may prevent or decrease complications (Diabetes Survival Skills Education) will improve Outcome: Progressing Goal: Individualized Educational Video(s) Outcome: Progressing   Problem: Coping: Goal: Ability to adjust to condition or change in health will improve Outcome: Progressing

## 2023-12-19 NOTE — Progress Notes (Signed)
 Patient is now scheduled for dental consult:   [place] @ 9am on 01/11/24 [facility]  Address [address] [place], [place] [address]- Patient will enter through patient Entrance located near the visitor parking deck.    Consult-01/11/24 at 10am consult with Dr.Sandhu at [address] [facility] [address] (2nd floor)- Office number [phone]  RN will update transportation coordinator to ensure transportation services are met.   RN spoke with patient and he is aware and information will be mailed to him also.  Patient is currently inpatient and is scheduled for his first Pluvicto  infusion on Thursday 7/31.  Patient is anticipated to be discharged prior to that.  Patient notified to contact transportation for this upcoming appointment.  Sherlean will be notified.

## 2023-12-19 NOTE — Progress Notes (Signed)
 Discharge medications delivered in a secure bag to patient at the bedside D Johnie RN

## 2023-12-19 NOTE — Plan of Care (Signed)
 Patient is alert and oriented, medications delivered to the bedside. PIV removed, AVS went over with patient in detail. Patient is quite forgetful. All medications, appointments, and pharmacy info went over with patient in extensive detail.  Problem: Education: Goal: Ability to describe self-care measures that may prevent or decrease complications (Diabetes Survival Skills Education) will improve Outcome: Completed/Met Goal: Individualized Educational Video(s) Outcome: Completed/Met   Problem: Coping: Goal: Ability to adjust to condition or change in health will improve Outcome: Completed/Met   Problem: Fluid Volume: Goal: Ability to maintain a balanced intake and output will improve Outcome: Completed/Met   Problem: Health Behavior/Discharge Planning: Goal: Ability to identify and utilize available resources and services will improve Outcome: Completed/Met Goal: Ability to manage health-related needs will improve Outcome: Completed/Met   Problem: Metabolic: Goal: Ability to maintain appropriate glucose levels will improve Outcome: Completed/Met   Problem: Nutritional: Goal: Maintenance of adequate nutrition will improve Outcome: Completed/Met Goal: Progress toward achieving an optimal weight will improve Outcome: Completed/Met   Problem: Skin Integrity: Goal: Risk for impaired skin integrity will decrease Outcome: Completed/Met   Problem: Tissue Perfusion: Goal: Adequacy of tissue perfusion will improve Outcome: Completed/Met   Problem: Education: Goal: Knowledge of General Education information will improve Description: Including pain rating scale, medication(s)/side effects and non-pharmacologic comfort measures Outcome: Completed/Met   Problem: Health Behavior/Discharge Planning: Goal: Ability to manage health-related needs will improve Outcome: Completed/Met   Problem: Clinical Measurements: Goal: Ability to maintain clinical measurements within normal limits  will improve Outcome: Completed/Met Goal: Will remain free from infection Outcome: Completed/Met Goal: Diagnostic test results will improve Outcome: Completed/Met Goal: Respiratory complications will improve Outcome: Completed/Met Goal: Cardiovascular complication will be avoided Outcome: Completed/Met   Problem: Activity: Goal: Risk for activity intolerance will decrease Outcome: Completed/Met   Problem: Nutrition: Goal: Adequate nutrition will be maintained Outcome: Completed/Met   Problem: Coping: Goal: Level of anxiety will decrease Outcome: Completed/Met   Problem: Elimination: Goal: Will not experience complications related to bowel motility Outcome: Completed/Met Goal: Will not experience complications related to urinary retention Outcome: Completed/Met   Problem: Pain Managment: Goal: General experience of comfort will improve and/or be controlled Outcome: Completed/Met   Problem: Safety: Goal: Ability to remain free from injury will improve Outcome: Completed/Met   Problem: Skin Integrity: Goal: Risk for impaired skin integrity will decrease Outcome: Completed/Met

## 2023-12-20 ENCOUNTER — Telehealth: Payer: Self-pay | Admitting: *Deleted

## 2023-12-20 NOTE — Telephone Encounter (Signed)
 Troy Mclaughlin states he was discharged from the hospital yesterday. States he was constipated, had enema in hospital and was discharged after having a BM. States this morning he had a BM that looks like black syrup. Wants to see Dr Tina tomorrow. Instructed [facility] after his pluvicto  tomorrow. RN will get update on bowel status and discuss with Dr Tina.

## 2023-12-20 NOTE — Written Directive (Cosign Needed)
  PLUVICTO   THERAPY   RADIOPHARMACEUTICAL: Lutetium 177 vipivotide tetraxetan (Pluvicto )     PRESCRIBED DOSE FOR ADMINISTRATION:  200 mCi   ROUTE OFADMINISTRATION:  IV   DIAGNOSIS:  malignant neoplasm of prostate, Prostate cancer, residual or recurrent disease suspected, worsening left side pain with bone mets r/o progression of prostate cancer, Malignant neoplasm of prostate (    REFERRING PHYSICIAN:   Tina Pauletta BROCKS, MD     TREATMENT #: 1   ADDITIONAL PHYSICIAN COMMENTS/NOTES:   AUTHORIZED USER SIGNATURE & TIME STAMP: Norleen GORMAN Boxer, MD   12/21/23    8:41 AM

## 2023-12-21 ENCOUNTER — Other Ambulatory Visit: Payer: Self-pay | Admitting: Nurse Practitioner

## 2023-12-21 ENCOUNTER — Other Ambulatory Visit: Payer: Self-pay

## 2023-12-21 ENCOUNTER — Other Ambulatory Visit (HOSPITAL_COMMUNITY): Payer: Self-pay

## 2023-12-21 ENCOUNTER — Encounter (HOSPITAL_COMMUNITY)
Admission: RE | Admit: 2023-12-21 | Discharge: 2023-12-21 | Disposition: A | Discharge status: Home / Self Care | Source: Ambulatory Visit

## 2023-12-21 DIAGNOSIS — C61 Malignant neoplasm of prostate: Secondary | ICD-10-CM | POA: Diagnosis present

## 2023-12-21 DIAGNOSIS — C799 Secondary malignant neoplasm of unspecified site: Secondary | ICD-10-CM

## 2023-12-21 DIAGNOSIS — G893 Neoplasm related pain (acute) (chronic): Secondary | ICD-10-CM

## 2023-12-21 DIAGNOSIS — Z515 Encounter for palliative care: Secondary | ICD-10-CM

## 2023-12-21 MED ORDER — XTAMPZA ER 13.5 MG PO C12A
13.5000 mg | EXTENDED_RELEASE_CAPSULE | Freq: Two times a day (BID) | ORAL | 0 refills | Status: DC
  Filled 2023-12-21: qty 60, 30d supply, fill #0

## 2023-12-21 MED ORDER — SODIUM CHLORIDE 0.9 % IV BOLUS
1000.0000 mL | Freq: Once | INTRAVENOUS | Status: AC
  Administered 2023-12-21: 1000 mL via INTRAVENOUS

## 2023-12-21 MED ORDER — OXYCODONE HCL 15 MG PO TABS
15.0000 mg | ORAL_TABLET | ORAL | 0 refills | Status: DC | PRN
  Filled 2023-12-21: qty 90, 15d supply, fill #0

## 2023-12-21 NOTE — Progress Notes (Signed)
 CLINICAL DATA: [68 year old male with castrate resistant metastatic prostate carcinoma.  PSMA avid avid bone metastasis recent PET scan (12/14/2023.]  EXAM: NUCLEAR MEDICINE PLUVICTO  INJECTION  TECHNIQUE: Infusion: The nuclear medicine technologist and I personally verified the dose activity to be delivered as specified in the written directive, and verified the patient identification via 2 separate methods.  Initial flush of the intravenous catheter was performed was sterile saline. The dose syringe was connected to the catheter and the Lu-177 Pluvicto  administered over a 1 to 10 min infusion. Single 10 cc  lushes with normal saline follow the dose. No complications were noted. The entire IV tubing, venocatheter, stopcock and syringes was removed in total, placed in a disposal bag and sent for assay of the residual activity, which will be reported at a later time in our EMR by the physics staff. Pressure was applied to the venipuncture site, and a compression bandage placed. Patient monitored for 1 hour following infusion.    Radiation Safety personnel were present to perform the discharge survey, as detailed on their documentation. After a short period of observation, the patient had his IV removed.  RADIOPHARMACEUTICALS: [One hundred ninety-eight] microcuries Lu-177 PLUVICTO   FINDINGS: Current Infusion: [1]  Planned Infusions: 6    Patient presented to nuclear medicine for treatment. The patient's most recent blood counts were reviewed and remains a adequate candidate to proceed with Lu-177 Pluvicto .    Patient reports persistent chronic bone pain related to metastatic disease.    Patient reports dark stools with stable hemoglobin.  Dr. Tina visted  patient while in treatment bay.    Stable anemia.    Normal renal function.    PSA elevated at 73         The patient was situated in an infusion suite with a contact barrier placed under the arm. Intravenous access was  established, using sterile technique, and a normal saline infusion from a syringe was started.     Micro-dosimetry: The prescribed radiation activity was assayed and confirmed to be within specified tolerance.  IMPRESSION: Current Infusion: [1]  Planned Infusions: 6    [The patient tolerated the infusion well. The patient will return in 6 weeks for ongoing care.]

## 2023-12-21 NOTE — Telephone Encounter (Signed)
 LM with note below from Dr Tina: He is not supposed to come after pluvicto  because exposure of radiation to the staffs. He should make sure getting enough Miralax , stool softener and fiber with fluid at home as he is on more narcotics.

## 2023-12-21 NOTE — Progress Notes (Signed)
 Post-pluvicto  vital signs. Pt. Tolerated pluvicto  treatment well.

## 2023-12-21 NOTE — Progress Notes (Signed)
 Pre pluvicto  vital signs.

## 2023-12-28 ENCOUNTER — Other Ambulatory Visit: Payer: Self-pay

## 2023-12-28 NOTE — Progress Notes (Signed)
 Therapy changed from Nubeqa  to Pluvicto  as of 12/21/23. Disenrolled.

## 2024-01-04 ENCOUNTER — Emergency Department (HOSPITAL_COMMUNITY)

## 2024-01-04 ENCOUNTER — Encounter (HOSPITAL_COMMUNITY): Payer: Self-pay | Admitting: Emergency Medicine

## 2024-01-04 ENCOUNTER — Other Ambulatory Visit: Payer: Self-pay

## 2024-01-04 ENCOUNTER — Inpatient Hospital Stay (HOSPITAL_COMMUNITY)
Admission: EM | Admit: 2024-01-04 | Discharge: 2024-01-09 | DRG: 948 | Disposition: A | Discharge status: Home / Self Care | Attending: Internal Medicine | Admitting: Internal Medicine

## 2024-01-04 DIAGNOSIS — F17290 Nicotine dependence, other tobacco product, uncomplicated: Secondary | ICD-10-CM | POA: Diagnosis present

## 2024-01-04 DIAGNOSIS — Z8249 Family history of ischemic heart disease and other diseases of the circulatory system: Secondary | ICD-10-CM

## 2024-01-04 DIAGNOSIS — Z823 Family history of stroke: Secondary | ICD-10-CM

## 2024-01-04 DIAGNOSIS — R112 Nausea with vomiting, unspecified: Principal | ICD-10-CM | POA: Diagnosis present

## 2024-01-04 DIAGNOSIS — Z7189 Other specified counseling: Secondary | ICD-10-CM

## 2024-01-04 DIAGNOSIS — K219 Gastro-esophageal reflux disease without esophagitis: Secondary | ICD-10-CM | POA: Diagnosis present

## 2024-01-04 DIAGNOSIS — Z923 Personal history of irradiation: Secondary | ICD-10-CM

## 2024-01-04 DIAGNOSIS — Z5986 Financial insecurity: Secondary | ICD-10-CM

## 2024-01-04 DIAGNOSIS — R4589 Other symptoms and signs involving emotional state: Secondary | ICD-10-CM

## 2024-01-04 DIAGNOSIS — I10 Essential (primary) hypertension: Secondary | ICD-10-CM | POA: Diagnosis present

## 2024-01-04 DIAGNOSIS — Z800 Family history of malignant neoplasm of digestive organs: Secondary | ICD-10-CM

## 2024-01-04 DIAGNOSIS — K5900 Constipation, unspecified: Secondary | ICD-10-CM | POA: Diagnosis present

## 2024-01-04 DIAGNOSIS — I252 Old myocardial infarction: Secondary | ICD-10-CM

## 2024-01-04 DIAGNOSIS — E119 Type 2 diabetes mellitus without complications: Secondary | ICD-10-CM | POA: Diagnosis present

## 2024-01-04 DIAGNOSIS — C799 Secondary malignant neoplasm of unspecified site: Secondary | ICD-10-CM

## 2024-01-04 DIAGNOSIS — F17200 Nicotine dependence, unspecified, uncomplicated: Secondary | ICD-10-CM

## 2024-01-04 DIAGNOSIS — D72810 Lymphocytopenia: Secondary | ICD-10-CM | POA: Diagnosis present

## 2024-01-04 DIAGNOSIS — C61 Malignant neoplasm of prostate: Secondary | ICD-10-CM | POA: Diagnosis not present

## 2024-01-04 DIAGNOSIS — K5730 Diverticulosis of large intestine without perforation or abscess without bleeding: Secondary | ICD-10-CM | POA: Diagnosis present

## 2024-01-04 DIAGNOSIS — Z1152 Encounter for screening for COVID-19: Secondary | ICD-10-CM

## 2024-01-04 DIAGNOSIS — J439 Emphysema, unspecified: Secondary | ICD-10-CM | POA: Diagnosis present

## 2024-01-04 DIAGNOSIS — Z79899 Other long term (current) drug therapy: Secondary | ICD-10-CM

## 2024-01-04 DIAGNOSIS — E785 Hyperlipidemia, unspecified: Secondary | ICD-10-CM | POA: Diagnosis present

## 2024-01-04 DIAGNOSIS — R64 Cachexia: Secondary | ICD-10-CM | POA: Diagnosis present

## 2024-01-04 DIAGNOSIS — C7951 Secondary malignant neoplasm of bone: Secondary | ICD-10-CM | POA: Diagnosis present

## 2024-01-04 DIAGNOSIS — D638 Anemia in other chronic diseases classified elsewhere: Secondary | ICD-10-CM | POA: Diagnosis present

## 2024-01-04 DIAGNOSIS — Z888 Allergy status to other drugs, medicaments and biological substances status: Secondary | ICD-10-CM

## 2024-01-04 DIAGNOSIS — Z8711 Personal history of peptic ulcer disease: Secondary | ICD-10-CM

## 2024-01-04 DIAGNOSIS — Z8673 Personal history of transient ischemic attack (TIA), and cerebral infarction without residual deficits: Secondary | ICD-10-CM

## 2024-01-04 DIAGNOSIS — G893 Neoplasm related pain (acute) (chronic): Secondary | ICD-10-CM | POA: Diagnosis not present

## 2024-01-04 DIAGNOSIS — Z681 Body mass index (BMI) 19 or less, adult: Secondary | ICD-10-CM

## 2024-01-04 DIAGNOSIS — E876 Hypokalemia: Secondary | ICD-10-CM | POA: Diagnosis present

## 2024-01-04 DIAGNOSIS — Z515 Encounter for palliative care: Secondary | ICD-10-CM

## 2024-01-04 DIAGNOSIS — Z5982 Transportation insecurity: Secondary | ICD-10-CM

## 2024-01-04 DIAGNOSIS — R54 Age-related physical debility: Secondary | ICD-10-CM | POA: Diagnosis present

## 2024-01-04 DIAGNOSIS — Z66 Do not resuscitate: Secondary | ICD-10-CM | POA: Diagnosis present

## 2024-01-04 LAB — DIFFERENTIAL
Abs Granulocyte: 2.1 K/uL (ref 1.5–6.5)
Abs Immature Granulocytes: 0.01 K/uL (ref 0.00–0.07)
Basophils Absolute: 0 K/uL (ref 0.0–0.1)
Basophils Relative: 0 %
Eosinophils Absolute: 0.1 K/uL (ref 0.0–0.5)
Eosinophils Relative: 2 %
Immature Granulocytes: 0 %
Lymphocytes Relative: 14 %
Lymphs Abs: 0.4 K/uL — ABNORMAL LOW (ref 0.7–4.0)
Monocytes Absolute: 0.4 K/uL (ref 0.1–1.0)
Monocytes Relative: 14 %
Neutro Abs: 2.1 K/uL (ref 1.7–7.7)
Neutrophils Relative %: 70 %

## 2024-01-04 LAB — URINALYSIS, ROUTINE W REFLEX MICROSCOPIC
Bacteria, UA: NONE SEEN
Bilirubin Urine: NEGATIVE
Glucose, UA: NEGATIVE mg/dL
Hgb urine dipstick: NEGATIVE
Ketones, ur: NEGATIVE mg/dL
Leukocytes,Ua: NEGATIVE
Nitrite: NEGATIVE
Protein, ur: 30 mg/dL — AB
Specific Gravity, Urine: 1.032 — ABNORMAL HIGH (ref 1.005–1.030)
pH: 5 (ref 5.0–8.0)

## 2024-01-04 LAB — COMPREHENSIVE METABOLIC PANEL WITH GFR
ALT: 8 U/L (ref 0–44)
AST: 13 U/L — ABNORMAL LOW (ref 15–41)
Albumin: 3.3 g/dL — ABNORMAL LOW (ref 3.5–5.0)
Alkaline Phosphatase: 540 U/L — ABNORMAL HIGH (ref 38–126)
Anion gap: 11 (ref 5–15)
BUN: 11 mg/dL (ref 8–23)
CO2: 24 mmol/L (ref 22–32)
Calcium: 8.5 mg/dL — ABNORMAL LOW (ref 8.9–10.3)
Chloride: 97 mmol/L — ABNORMAL LOW (ref 98–111)
Creatinine, Ser: 0.86 mg/dL (ref 0.61–1.24)
GFR, Estimated: >60 mL/min (ref >60)
Glucose, Bld: 96 mg/dL (ref 70–99)
Potassium: 2.7 mmol/L — CL (ref 3.5–5.1)
Sodium: 132 mmol/L — ABNORMAL LOW (ref 135–145)
Total Bilirubin: 0.5 mg/dL (ref 0.0–1.2)
Total Protein: 7.3 g/dL (ref 6.5–8.1)

## 2024-01-04 LAB — CBC
HCT: 27.6 % — ABNORMAL LOW (ref 39.0–52.0)
Hemoglobin: 8.7 g/dL — ABNORMAL LOW (ref 13.0–17.0)
MCH: 21.9 pg — ABNORMAL LOW (ref 26.0–34.0)
MCHC: 31.5 g/dL (ref 30.0–36.0)
MCV: 69.5 fL — ABNORMAL LOW (ref 80.0–100.0)
Platelets: 347 K/uL (ref 150–400)
RBC: 3.97 MIL/uL — ABNORMAL LOW (ref 4.22–5.81)
RDW: 17.1 % — ABNORMAL HIGH (ref 11.5–15.5)
WBC: 2.8 K/uL — ABNORMAL LOW (ref 4.0–10.5)
nRBC: 0 % (ref 0.0–0.2)

## 2024-01-04 LAB — I-STAT CG4 LACTIC ACID, ED: Lactic Acid, Venous: 0.9 mmol/L (ref 0.5–1.9)

## 2024-01-04 LAB — RESP PANEL BY RT-PCR (RSV, FLU A&B, COVID)  RVPGX2
Influenza A by PCR: NEGATIVE
Influenza B by PCR: NEGATIVE
Resp Syncytial Virus by PCR: NEGATIVE
SARS Coronavirus 2 by RT PCR: NEGATIVE

## 2024-01-04 LAB — MAGNESIUM: Magnesium: 1.6 mg/dL — ABNORMAL LOW (ref 1.7–2.4)

## 2024-01-04 LAB — TROPONIN I (HIGH SENSITIVITY)
Troponin I (High Sensitivity): 8 ng/L (ref <18)
Troponin I (High Sensitivity): 11 ng/L (ref <18)

## 2024-01-04 MED ORDER — ONDANSETRON HCL 4 MG/2ML IJ SOLN
4.0000 mg | Freq: Four times a day (QID) | INTRAMUSCULAR | Status: DC | PRN
  Administered 2024-01-05: 4 mg via INTRAVENOUS
  Filled 2024-01-04: qty 2

## 2024-01-04 MED ORDER — IOHEXOL 350 MG/ML SOLN
100.0000 mL | Freq: Once | INTRAVENOUS | Status: AC | PRN
  Administered 2024-01-04: 100 mL via INTRAVENOUS

## 2024-01-04 MED ORDER — BISACODYL 5 MG PO TBEC: 5.0000 mg | DELAYED_RELEASE_TABLET | Freq: Every day | ORAL | Status: DC | PRN

## 2024-01-04 MED ORDER — ACETAMINOPHEN 650 MG RE SUPP: 650.0000 mg | Freq: Four times a day (QID) | RECTAL | Status: DC | PRN

## 2024-01-04 MED ORDER — MAGNESIUM SULFATE 2 GM/50ML IV SOLN
2.0000 g | Freq: Once | INTRAVENOUS | Status: AC
  Administered 2024-01-04: 2 g via INTRAVENOUS
  Filled 2024-01-04: qty 50

## 2024-01-04 MED ORDER — MORPHINE SULFATE (PF) 4 MG/ML IV SOLN
4.0000 mg | Freq: Once | INTRAVENOUS | Status: AC
  Administered 2024-01-04: 4 mg via INTRAVENOUS
  Filled 2024-01-04: qty 1

## 2024-01-04 MED ORDER — OXYCODONE HCL 5 MG PO TABS
15.0000 mg | ORAL_TABLET | ORAL | Status: DC | PRN
  Administered 2024-01-04: 15 mg via ORAL
  Filled 2024-01-04: qty 3

## 2024-01-04 MED ORDER — LACTATED RINGERS IV SOLN: INTRAVENOUS | Status: AC

## 2024-01-04 MED ORDER — SODIUM CHLORIDE 0.9 % IV BOLUS
1000.0000 mL | Freq: Once | INTRAVENOUS | Status: AC
  Administered 2024-01-04: 1000 mL via INTRAVENOUS

## 2024-01-04 MED ORDER — OXYCODONE HCL ER 15 MG PO T12A
15.0000 mg | EXTENDED_RELEASE_TABLET | Freq: Two times a day (BID) | ORAL | Status: DC
  Administered 2024-01-04 – 2024-01-06 (×4): 15 mg via ORAL
  Filled 2024-01-04 (×4): qty 1

## 2024-01-04 MED ORDER — SENNA 8.6 MG PO TABS
1.0000 | ORAL_TABLET | Freq: Two times a day (BID) | ORAL | Status: DC
  Administered 2024-01-04 – 2024-01-05 (×2): 8.6 mg via ORAL
  Filled 2024-01-04 (×2): qty 1

## 2024-01-04 MED ORDER — POLYETHYLENE GLYCOL 3350 17 G PO PACK: 17.0000 g | PACK | Freq: Every day | ORAL | Status: DC | PRN

## 2024-01-04 MED ORDER — HYDROMORPHONE HCL 1 MG/ML IJ SOLN
1.0000 mg | Freq: Once | INTRAMUSCULAR | Status: AC
  Administered 2024-01-04: 1 mg via INTRAVENOUS
  Filled 2024-01-04: qty 1

## 2024-01-04 MED ORDER — POTASSIUM CHLORIDE 20 MEQ PO PACK
60.0000 meq | PACK | Freq: Once | ORAL | Status: AC
  Administered 2024-01-04: 60 meq via ORAL
  Filled 2024-01-04: qty 3

## 2024-01-04 MED ORDER — ACETAMINOPHEN 325 MG PO TABS
650.0000 mg | ORAL_TABLET | Freq: Four times a day (QID) | ORAL | Status: DC | PRN
Start: 2024-01-04 — End: 2024-01-10

## 2024-01-04 MED ORDER — HYDROMORPHONE HCL 1 MG/ML IJ SOLN
1.0000 mg | INTRAMUSCULAR | Status: DC | PRN
  Administered 2024-01-04 – 2024-01-05 (×3): 1 mg via INTRAVENOUS
  Filled 2024-01-04 (×3): qty 1

## 2024-01-04 MED ORDER — POTASSIUM CHLORIDE 10 MEQ/100ML IV SOLN
10.0000 meq | INTRAVENOUS | Status: AC
  Administered 2024-01-04 (×4): 10 meq via INTRAVENOUS
  Filled 2024-01-04 (×4): qty 100

## 2024-01-04 MED ORDER — ONDANSETRON HCL 4 MG/2ML IJ SOLN
4.0000 mg | Freq: Once | INTRAMUSCULAR | Status: AC
  Administered 2024-01-04: 4 mg via INTRAVENOUS
  Filled 2024-01-04: qty 2

## 2024-01-04 NOTE — Assessment & Plan Note (Signed)
 Received ondansetron  in the ED. Continue prn ondansetron 

## 2024-01-04 NOTE — Assessment & Plan Note (Signed)
 Replacement per primary
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
## 2024-01-04 NOTE — Hospital Course (Signed)
 Troy Mclaughlin is a 68 y.o. 

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
## 2024-01-04 NOTE — ED Notes (Signed)
 Pt to CT

## 2024-01-04 NOTE — Assessment & Plan Note (Signed)
 Replaced in ED Continue monitor per primary team

## 2024-01-04 NOTE — ED Triage Notes (Signed)
 Patient c/o weakness x 3 days ago. Patient nausea and vomiting x 3 today. Patient report fever at home. Patient report last chemo treatment 2 weeks ago. Hx Lung CA
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
## 2024-01-04 NOTE — Assessment & Plan Note (Addendum)
 Progression on multiple treatment. Recommend home hospice. Please consult hospice and arrange for home hospice before discharge

## 2024-01-04 NOTE — ED Provider Notes (Signed)
 [place] EMERGENCY DEPARTMENT AT [facility] Provider Note   CSN: [id number] Arrival date & time: 01/04/24  1321     Patient presents with: Emesis and Weakness   Troy Mclaughlin is a 68 y.o. male.   Troy Mclaughlin is a 67 y.o. male with a history of stage IV prostate cancer, hypertension, alcohol  abuse, diabetes, who presents to the emergency department with weakness, vomiting and abdominal pain for the past 3 days.  Patient reports he has had persistent vomiting and has been unable to keep down any food drink or medications for the past 3 days.  He reports some chills and subjective fever at home but did not ever take his temperature.  He reports generalized abdominal pain that is constant and feels worse than his usual cancer pain.  He also reports some associated shortness of breath and chest pain.  He reports that he is have this ongoing from his cancer history and has a hard time telling me if this is suddenly changed or worsened.  He denies any dysuria or urinary frequency.  Reports some occasional diarrhea.  Reports he is followed by Dr. Tina with oncology.  2 weeks ago had Pluvicto  treatment as a last possible option for his cancer treatment as he is continue to have progression of disease despite multiple different treatment regimens.  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  The history is provided by the patient and medical records.  Emesis Associated symptoms: abdominal pain, arthralgias, chills, cough and myalgias   Associated symptoms: no diarrhea and no fever   Weakness Associated symptoms: abdominal pain, arthralgias, chest pain, cough, myalgias, nausea and vomiting   Associated symptoms: no diarrhea, no fever and no shortness of breath        Prior to Admission medications    Medication Sig Start Date End Date Taking? Authorizing Provider  acetaminophen  (TYLENOL ) 500 MG tablet Take 2 tablets (1,000 mg total) by mouth every 6 (six) hours as needed for moderate pain or mild pain. 05/05/21   Nivia Colon, PA-C  albuterol  (VENTOLIN  HFA) 108 (90 Base) MCG/ACT inhaler Inhale 1-2 puffs into the lungs every 6 (six) hours as needed for wheezing or shortness of breath. 08/21/23   Pickenpack-Cousar, Athena N, NP  amLODipine  (NORVASC ) 10 MG tablet Take 1 tablet (10 mg total) by mouth every morning. 10/25/23   Tina Pauletta BROCKS, MD  atorvastatin  (LIPITOR) 10 MG tablet Take 1 tablet (10 mg total) by mouth every morning. 10/25/23   Tina Pauletta BROCKS, MD  bisacodyl  (DULCOLAX) 5 MG EC tablet Take 1 tablet (5 mg total) by mouth daily as needed for moderate constipation. 12/19/23   Dorinda Drue DASEN, MD  carvedilol  (COREG ) 12.5 MG tablet Take 1 tablet (12.5 mg total) by mouth 2 (two) times daily with a meal. 10/25/23   Tina Pauletta BROCKS, MD  cyanocobalamin  (VITAMIN B12) 1000 MCG tablet Take 1 tablet (1,000 mcg total) by mouth daily. 05/22/23   Tina Pauletta BROCKS, MD  darolutamide  (NUBEQA ) 300 MG tablet Take 2 tablets (600 mg total) by mouth 2 (two) times daily with a meal. 06/19/23   Tina Pauletta BROCKS, MD  dronabinol  (MARINOL ) 10 MG capsule Take 1 capsule (10 mg total) by mouth 2 (two) times daily before a meal. 10/25/23   Pickenpack-Cousar, Fannie SAILOR, NP  feeding supplement (ENSURE PLUS HIGH PROTEIN) LIQD Take 237 mLs by mouth 2 (two) times daily between meals. 12/20/23   Dorinda Drue DASEN, MD  ferrous sulfate  325 (65 FE) MG tablet Take 1 tablet (325 mg total) by mouth daily before breakfast. 12/20/23   Dorinda Drue DASEN, MD  lactulose  (CHRONULAC ) 10 GM/15ML solution Take 30 mLs (20 g total) by mouth 2 (two) times daily. 12/19/23   Dorinda Drue DASEN, MD  nicotine  (NICODERM CQ  - DOSED IN MG/24 HOURS) 14 mg/24hr patch Place 1 patch (14 mg total) onto the skin daily. Patient not taking: Reported on 12/14/2023 09/18/23    Pickenpack-Cousar, Athena N, NP  nitroGLYCERIN  (NITROSTAT ) 0.4 MG SL tablet Place 1 tablet under the tongue every 15 minutes up to 3 doses within 15 minutes as needed for chest pain 08/04/23   Oley Bascom RAMAN, NP  ondansetron  (ZOFRAN ) 4 MG tablet Take 1 tablet (4 mg total) by mouth every 8 (eight) hours as needed for nausea 10/25/23   Pickenpack-Cousar, Fannie SAILOR, NP  oxyCODONE  (ROXICODONE ) 15 MG immediate release tablet Take 1 tablet (15 mg total) by mouth every 4 (four) hours as needed for pain. 12/21/23   Pickenpack-Cousar, Fannie SAILOR, NP  oxyCODONE  ER (XTAMPZA  ER) 13.5 MG C12A Take 1 capsule (13.5 mg) by mouth every 12 (twelve) hours. 12/21/23   Pickenpack-Cousar, Fannie SAILOR, NP  senna-docusate (SENOKOT-S) 8.6-50 MG tablet Take 1 tablet by mouth 2 (two) times daily. Patient taking differently: Take 1 tablet by mouth 2 (two) times daily as needed for moderate constipation. 07/19/23   Jillian Buttery, MD  sildenafil  (VIAGRA ) 100 MG tablet Take 1 tablet by mouth 30 minutes before sexual activity as needed for erectile dysfunction 07/19/23   Marvine Almarie CROME, DO  traZODone  (DESYREL ) 50 MG tablet Take 1 tablet (50 mg total) by mouth at bedtime. Patient taking differently: Take 50 mg by mouth at bedtime as needed for sleep. 09/06/23   Oley Bascom RAMAN, NP    Allergies: Lisinopril  and Metformin  and related    Review of Systems  Constitutional:  Positive for chills. Negative for fever.  HENT: Negative.    Respiratory:  Positive for cough. Negative for shortness of breath.   Cardiovascular:  Positive for chest pain.  Gastrointestinal:  Positive for abdominal pain, nausea and vomiting. Negative for constipation and diarrhea.  Musculoskeletal:  Positive for arthralgias and myalgias.  Neurological:  Positive for weakness.  All other systems reviewed and are negative.   Updated Vital Signs BP 121/72 (BP Location: Left Arm)   Pulse 66   Temp 98.6 F (37 C) (Oral)   Resp 16   SpO2 100%   Physical  Exam Vitals and nursing note reviewed.  Constitutional:      General: He is not in acute distress.    Appearance: Normal appearance. He is well-developed. He is ill-appearing. He is not diaphoretic.     Comments: Alert chronically ill-appearing but in no acute distress  HENT:     Head: Normocephalic and atraumatic.     Mouth/Throat:     Mouth: Mucous membranes are moist.     Pharynx: Oropharynx is clear.  Eyes:     General:        Right eye: No discharge.        Left eye: No discharge.  Cardiovascular:     Rate and Rhythm: Normal rate and regular rhythm.     Pulses: Normal pulses.     Heart sounds: Normal heart sounds.  Pulmonary:  Effort: Pulmonary effort is normal. No respiratory distress.     Breath sounds: Normal breath sounds. No wheezing or rales.     Comments: Respirations equal and unlabored, patient able to speak in full sentences, lungs clear to auscultation bilaterally  Abdominal:     General: Bowel sounds are normal. There is no distension.     Palpations: Abdomen is soft. There is no mass.     Tenderness: There is abdominal tenderness. There is no guarding.     Comments: Abdomen soft, nondistended, generalized tenderness noted throughout the abdomen without guarding or rebound tenderness  Musculoskeletal:        General: No deformity.     Cervical back: Neck supple.     Right lower leg: No edema.     Left lower leg: No edema.  Skin:    General: Skin is warm and dry.     Capillary Refill: Capillary refill takes less than 2 seconds.  Neurological:     Mental Status: He is alert and oriented to person, place, and time.     Coordination: Coordination normal.     Comments: Speech is clear, able to follow commands CN III-XII intact Normal strength in upper and lower extremities bilaterally including dorsiflexion and plantar flexion, strong and equal grip strength Sensation normal to light and sharp touch Moves extremities without ataxia, coordination intact   Psychiatric:        Mood and Affect: Mood normal.        Behavior: Behavior normal.     (all labs ordered are listed, but only abnormal results are displayed) Labs Reviewed  COMPREHENSIVE METABOLIC PANEL WITH GFR - Abnormal; Notable for the following components:      Result Value   Sodium 132 (*)    Potassium 2.7 (*)    Chloride 97 (*)    Calcium  8.5 (*)    Albumin 3.3 (*)    AST 13 (*)    Alkaline Phosphatase 540 (*)    All other components within normal limits  CBC - Abnormal; Notable for the following components:   WBC 2.8 (*)    RBC 3.97 (*)    Hemoglobin 8.7 (*)    HCT 27.6 (*)    MCV 69.5 (*)    MCH 21.9 (*)    RDW 17.1 (*)    All other components within normal limits  URINALYSIS, ROUTINE W REFLEX MICROSCOPIC - Abnormal; Notable for the following components:   Specific Gravity, Urine 1.032 (*)    Protein, ur 30 (*)    All other components within normal limits  DIFFERENTIAL - Abnormal; Notable for the following components:   Lymphs Abs 0.4 (*)    All other components within normal limits  MAGNESIUM  - Abnormal; Notable for the following components:   Magnesium  1.6 (*)    All other components within normal limits  RESP PANEL BY RT-PCR (RSV, FLU A&B, COVID)  RVPGX2  CULTURE, BLOOD (ROUTINE X 2)  CULTURE, BLOOD (ROUTINE X 2)  MAGNESIUM   BASIC METABOLIC PANEL WITH GFR  CBC  I-STAT CG4 LACTIC ACID, ED  I-STAT CG4 LACTIC ACID, ED  TROPONIN I (HIGH SENSITIVITY)  TROPONIN I (HIGH SENSITIVITY)    EKG: EKG Interpretation Date/Time:  Thursday January 04 2024 16:25:18 EDT Ventricular Rate:  62 PR Interval:  143 QRS Duration:  94 QT Interval:  434 QTC Calculation: 441 R Axis:   63  Text Interpretation: Sinus rhythm Left ventricular hypertrophy Anterior infarct, old Nonspecific T abnormalities, lateral leads Confirmed by  Cleotilde Rogue ([id number]) on 01/05/2024 8:30:53 PM  Radiology: CT Angio Chest PE W/Cm &/Or Wo Cm Result Date: 01/04/2024 CLINICAL DATA:  Abdominal  pain, cough, fever, history of metastatic prostate cancer EXAM: CT ANGIOGRAPHY CHEST CT ABDOMEN AND PELVIS WITH CONTRAST TECHNIQUE: Multidetector CT imaging of the chest was performed using the standard protocol during bolus administration of intravenous contrast. Multiplanar CT image reconstructions and MIPs were obtained to evaluate the vascular anatomy. Multidetector CT imaging of the abdomen and pelvis was performed using the standard protocol during bolus administration of intravenous contrast. RADIATION DOSE REDUCTION: This exam was performed according to the departmental dose-optimization program which includes automated exposure control, adjustment of the mA and/or kV according to patient size and/or use of iterative reconstruction technique. CONTRAST:  OMNIPAQUE  IOHEXOL  350 MG/ML SOLN COMPARISON:  12/14/2023 FINDINGS: CTA CHEST FINDINGS Cardiovascular: This is a technically adequate evaluation of the pulmonary vasculature. No filling defects or pulmonary emboli. The heart is unremarkable without pericardial effusion. Normal caliber of the thoracic aorta. Stable atherosclerosis of the coronary vasculature. Mediastinum/Nodes: No enlarged mediastinal, hilar, or axillary lymph nodes. Thyroid  gland, trachea, and esophagus demonstrate no significant findings. Stable postsurgical changes at the gastroesophageal junction. Lungs/Pleura: Stable emphysema. Chronic volume loss and consolidation again noted within the left upper and left lower lobe, consistent with areas of scarring and rounded atelectasis. No acute airspace disease, effusion, or pneumothorax. Central airways are patent. Musculoskeletal: Diffuse sclerotic bony metastases are again noted, not appreciably changed since the 12/14/2023 exam. No evidence of pathologic fracture. Review of the MIP images confirms the above findings. CT ABDOMEN and PELVIS FINDINGS Hepatobiliary: Stable hemangioma right lobe liver. Multiple scattered subcentimeter liver  cysts are unchanged. Gallbladder is decompressed without evidence of cholelithiasis or cholecystitis. No biliary duct dilation. Pancreas: Unremarkable. No pancreatic ductal dilatation or surrounding inflammatory changes. Spleen: Normal in size without focal abnormality. Adrenals/Urinary Tract: Stable 5 mm nonobstructing calculus lower pole left kidney. No obstructive uropathy within either kidney. The adrenals are stable. Bladder is unremarkable. Stomach/Bowel: No bowel obstruction or ileus. Normal appendix right lower quadrant. Scattered colonic diverticulosis without evidence of acute diverticulitis. No bowel wall thickening or inflammatory change. Vascular/Lymphatic: Stable atherosclerosis of the aorta and its branches. No pathologic adenopathy within the abdomen or pelvis. Reproductive: Fiduciary markers again noted within the prostate, which is not enlarged. Other: No free fluid or free intraperitoneal gas. No abdominal wall hernia. Musculoskeletal: Diffuse sclerotic bony metastases are again identified, without significant change since the prior exam. No evidence of pathologic fracture. Review of the MIP images confirms the above findings. IMPRESSION: 1. No evidence of pulmonary embolus. 2. Diffuse sclerotic bony metastases consistent with known history of metastatic prostate cancer. 3. Stable areas of scarring and rounded atelectasis within the left lung. No acute airspace disease. 4. Colonic diverticulosis without evidence of acute diverticulitis. 5.  Aortic Atherosclerosis (ICD10-I70.0). Electronically Signed   By: Ozell Daring M.D.   On: 01/04/2024 17:56   CT ABDOMEN PELVIS W CONTRAST Result Date: 01/04/2024 CLINICAL DATA:  Abdominal pain, cough, fever, history of metastatic prostate cancer EXAM: CT ANGIOGRAPHY CHEST CT ABDOMEN AND PELVIS WITH CONTRAST TECHNIQUE: Multidetector CT imaging of the chest was performed using the standard protocol during bolus administration of intravenous contrast.  Multiplanar CT image reconstructions and MIPs were obtained to evaluate the vascular anatomy. Multidetector CT imaging of the abdomen and pelvis was performed using the standard protocol during bolus administration of intravenous contrast. RADIATION DOSE REDUCTION: This exam was performed according to the departmental dose-optimization  program which includes automated exposure control, adjustment of the mA and/or kV according to patient size and/or use of iterative reconstruction technique. CONTRAST:  OMNIPAQUE  IOHEXOL  350 MG/ML SOLN COMPARISON:  12/14/2023 FINDINGS: CTA CHEST FINDINGS Cardiovascular: This is a technically adequate evaluation of the pulmonary vasculature. No filling defects or pulmonary emboli. The heart is unremarkable without pericardial effusion. Normal caliber of the thoracic aorta. Stable atherosclerosis of the coronary vasculature. Mediastinum/Nodes: No enlarged mediastinal, hilar, or axillary lymph nodes. Thyroid  gland, trachea, and esophagus demonstrate no significant findings. Stable postsurgical changes at the gastroesophageal junction. Lungs/Pleura: Stable emphysema. Chronic volume loss and consolidation again noted within the left upper and left lower lobe, consistent with areas of scarring and rounded atelectasis. No acute airspace disease, effusion, or pneumothorax. Central airways are patent. Musculoskeletal: Diffuse sclerotic bony metastases are again noted, not appreciably changed since the 12/14/2023 exam. No evidence of pathologic fracture. Review of the MIP images confirms the above findings. CT ABDOMEN and PELVIS FINDINGS Hepatobiliary: Stable hemangioma right lobe liver. Multiple scattered subcentimeter liver cysts are unchanged. Gallbladder is decompressed without evidence of cholelithiasis or cholecystitis. No biliary duct dilation. Pancreas: Unremarkable. No pancreatic ductal dilatation or surrounding inflammatory changes. Spleen: Normal in size without focal  abnormality. Adrenals/Urinary Tract: Stable 5 mm nonobstructing calculus lower pole left kidney. No obstructive uropathy within either kidney. The adrenals are stable. Bladder is unremarkable. Stomach/Bowel: No bowel obstruction or ileus. Normal appendix right lower quadrant. Scattered colonic diverticulosis without evidence of acute diverticulitis. No bowel wall thickening or inflammatory change. Vascular/Lymphatic: Stable atherosclerosis of the aorta and its branches. No pathologic adenopathy within the abdomen or pelvis. Reproductive: Fiduciary markers again noted within the prostate, which is not enlarged. Other: No free fluid or free intraperitoneal gas. No abdominal wall hernia. Musculoskeletal: Diffuse sclerotic bony metastases are again identified, without significant change since the prior exam. No evidence of pathologic fracture. Review of the MIP images confirms the above findings. IMPRESSION: 1. No evidence of pulmonary embolus. 2. Diffuse sclerotic bony metastases consistent with known history of metastatic prostate cancer. 3. Stable areas of scarring and rounded atelectasis within the left lung. No acute airspace disease. 4. Colonic diverticulosis without evidence of acute diverticulitis. 5.  Aortic Atherosclerosis (ICD10-I70.0). Electronically Signed   By: Ozell Daring M.D.   On: 01/04/2024 17:56   DG Chest Port 1 View Result Date: 01/04/2024 CLINICAL DATA:  Cough, fever. EXAM: PORTABLE CHEST 1 VIEW COMPARISON:  December 14, 2023. FINDINGS: The heart size and mediastinal contours are within normal limits. Old right rib fractures are noted. Sclerotic densities are noted throughout visualized skeleton suggesting metastatic disease. Stable left midlung opacity is noted most consistent with scarring or atelectasis. Right lung is unremarkable. IMPRESSION: Stable diffuse sclerotic densities consistent with osseous metastases. Stable left midlung opacity is noted consistent with scarring or atelectasis.  Electronically Signed   By: Lynwood Landy Raddle M.D.   On: 01/04/2024 16:20     Procedures   Medications Ordered in the ED  potassium chloride  10 mEq in 100 mL IVPB (10 mEq Intravenous New Bag/Given 01/04/24 2011)  lactated ringers  infusion (has no administration in time range)  sodium chloride  0.9 % bolus 1,000 mL (0 mLs Intravenous Stopped 01/04/24 1942)  ondansetron  (ZOFRAN ) injection 4 mg (4 mg Intravenous Given 01/04/24 1635)  morphine  (PF) 4 MG/ML injection 4 mg (4 mg Intravenous Given 01/04/24 1635)  iohexol  (OMNIPAQUE ) 350 MG/ML injection 100 mL (100 mLs Intravenous Contrast Given 01/04/24 1640)  magnesium  sulfate IVPB 2 g 50  mL (0 g Intravenous Stopped 01/04/24 1941)  HYDROmorphone  (DILAUDID ) injection 1 mg (1 mg Intravenous Given 01/04/24 1832)  potassium chloride  (KLOR-CON ) packet 60 mEq (60 mEq Oral Given 01/04/24 2131)                                    Medical Decision Making Amount and/or Complexity of Data Reviewed Labs: ordered. Radiology: ordered.  Risk Prescription drug management. Decision regarding hospitalization.   68 year old male with stage IV prostate cancer presents with 3 days of vomiting weakness and pain.  Has been unable to keep down home pain medications.  Started on new cancer treatment, Pluvicto , 2 weeks ago but has had worsening pain despite this.  Questionable fevers although patient did not take temperature at home.  Given multiple symptoms and questionable fever in the setting of cancer we will initiate broad workup with labs including blood cultures, lactic acid, cardiac enzymes and imaging.  EKG with normal sinus rhythm.  Mild leukopenia of 2.8, differential with ANC of 2.1, not neutropenic stable anemia, normal platelets, hypokalemia of 2.7 as well as hypomagnesemia, IV electrolyte replacement ordered, renal function at baseline, alk phos continuing to increase despite new cancer treatment.  Normal troponin and lactic acid, blood cultures pending.   Urinalysis without any evidence of infection.  Chest x-ray reviewed and interpreted with stable diffuse sclerotic densities in the spine and stable left midlung opacity.  CT angio of the chest as well as CT abdomen pelvis ordered, imaging viewed and interpreted, no evidence of pulmonary embolus, bony metastasis again noted stable areas of scarring and atelectasis in the lung but no acute pneumonia or other airspace disease, diverticulosis without evidence of diverticulitis noted.  Patient continues to have severe pain and requiring antiemetics and IV pain control as well as IV electrolyte replacement.  Patient's oncologist Dr. Tina has been down to see the patient.  Concerned about worsening alk phos and worsening pain despite new cancer treatment and suspects continued progression of the disease.  Recommends admission for IV pain control and electrolyte replacement and palliative and hospice care consults.   Case discussed with Dr. Tobie with [facility] hospitalist who will see and admit the patient.       Final diagnoses:  Nausea and vomiting, unspecified vomiting type  Hypokalemia  Hypomagnesemia  Cancer associated pain    ED Discharge Orders     None          Alva Larraine JULIANNA DEVONNA 01/11/24 1043    Freddi Hamilton, MD 01/16/24 [id number]

## 2024-01-05 DIAGNOSIS — D72810 Lymphocytopenia: Secondary | ICD-10-CM | POA: Diagnosis present

## 2024-01-05 DIAGNOSIS — Z888 Allergy status to other drugs, medicaments and biological substances status: Secondary | ICD-10-CM | POA: Diagnosis not present

## 2024-01-05 DIAGNOSIS — G893 Neoplasm related pain (acute) (chronic): Secondary | ICD-10-CM | POA: Diagnosis present

## 2024-01-05 DIAGNOSIS — R4589 Other symptoms and signs involving emotional state: Secondary | ICD-10-CM

## 2024-01-05 DIAGNOSIS — E785 Hyperlipidemia, unspecified: Secondary | ICD-10-CM | POA: Diagnosis present

## 2024-01-05 DIAGNOSIS — E119 Type 2 diabetes mellitus without complications: Secondary | ICD-10-CM | POA: Diagnosis present

## 2024-01-05 DIAGNOSIS — Z823 Family history of stroke: Secondary | ICD-10-CM | POA: Diagnosis not present

## 2024-01-05 DIAGNOSIS — Z8249 Family history of ischemic heart disease and other diseases of the circulatory system: Secondary | ICD-10-CM | POA: Diagnosis not present

## 2024-01-05 DIAGNOSIS — D638 Anemia in other chronic diseases classified elsewhere: Secondary | ICD-10-CM | POA: Diagnosis present

## 2024-01-05 DIAGNOSIS — C61 Malignant neoplasm of prostate: Secondary | ICD-10-CM

## 2024-01-05 DIAGNOSIS — Z515 Encounter for palliative care: Secondary | ICD-10-CM | POA: Diagnosis not present

## 2024-01-05 DIAGNOSIS — Z8673 Personal history of transient ischemic attack (TIA), and cerebral infarction without residual deficits: Secondary | ICD-10-CM | POA: Diagnosis not present

## 2024-01-05 DIAGNOSIS — Z66 Do not resuscitate: Secondary | ICD-10-CM | POA: Diagnosis present

## 2024-01-05 DIAGNOSIS — Z681 Body mass index (BMI) 19 or less, adult: Secondary | ICD-10-CM | POA: Diagnosis not present

## 2024-01-05 DIAGNOSIS — I10 Essential (primary) hypertension: Secondary | ICD-10-CM | POA: Diagnosis present

## 2024-01-05 DIAGNOSIS — J439 Emphysema, unspecified: Secondary | ICD-10-CM | POA: Diagnosis present

## 2024-01-05 DIAGNOSIS — Z800 Family history of malignant neoplasm of digestive organs: Secondary | ICD-10-CM | POA: Diagnosis not present

## 2024-01-05 DIAGNOSIS — Z7189 Other specified counseling: Secondary | ICD-10-CM

## 2024-01-05 DIAGNOSIS — K5900 Constipation, unspecified: Secondary | ICD-10-CM | POA: Diagnosis not present

## 2024-01-05 DIAGNOSIS — R112 Nausea with vomiting, unspecified: Secondary | ICD-10-CM

## 2024-01-05 DIAGNOSIS — Z79899 Other long term (current) drug therapy: Secondary | ICD-10-CM

## 2024-01-05 DIAGNOSIS — Z1152 Encounter for screening for COVID-19: Secondary | ICD-10-CM | POA: Diagnosis not present

## 2024-01-05 DIAGNOSIS — E876 Hypokalemia: Secondary | ICD-10-CM

## 2024-01-05 DIAGNOSIS — C7951 Secondary malignant neoplasm of bone: Secondary | ICD-10-CM | POA: Diagnosis present

## 2024-01-05 DIAGNOSIS — K219 Gastro-esophageal reflux disease without esophagitis: Secondary | ICD-10-CM | POA: Diagnosis present

## 2024-01-05 DIAGNOSIS — R64 Cachexia: Secondary | ICD-10-CM | POA: Diagnosis present

## 2024-01-05 DIAGNOSIS — I252 Old myocardial infarction: Secondary | ICD-10-CM | POA: Diagnosis not present

## 2024-01-05 LAB — BLOOD CULTURE ID PANEL (REFLEXED) - BCID2

## 2024-01-05 LAB — BASIC METABOLIC PANEL WITH GFR
Anion gap: 7 (ref 5–15)
BUN: 8 mg/dL (ref 8–23)
CO2: 24 mmol/L (ref 22–32)
Calcium: 8.5 mg/dL — ABNORMAL LOW (ref 8.9–10.3)
Chloride: 100 mmol/L (ref 98–111)
Creatinine, Ser: 0.72 mg/dL (ref 0.61–1.24)
GFR, Estimated: >60 mL/min (ref >60)
Glucose, Bld: 91 mg/dL (ref 70–99)
Potassium: 3.4 mmol/L — ABNORMAL LOW (ref 3.5–5.1)
Sodium: 131 mmol/L — ABNORMAL LOW (ref 135–145)

## 2024-01-05 LAB — CBC
HCT: 24.8 % — ABNORMAL LOW (ref 39.0–52.0)
Hemoglobin: 8 g/dL — ABNORMAL LOW (ref 13.0–17.0)
MCH: 22.7 pg — ABNORMAL LOW (ref 26.0–34.0)
MCHC: 32.3 g/dL (ref 30.0–36.0)
MCV: 70.5 fL — ABNORMAL LOW (ref 80.0–100.0)
Platelets: 290 K/uL (ref 150–400)
RBC: 3.52 MIL/uL — ABNORMAL LOW (ref 4.22–5.81)
RDW: 17.5 % — ABNORMAL HIGH (ref 11.5–15.5)
WBC: 2.8 K/uL — ABNORMAL LOW (ref 4.0–10.5)
nRBC: 0 % (ref 0.0–0.2)

## 2024-01-05 LAB — MAGNESIUM: Magnesium: 1.8 mg/dL (ref 1.7–2.4)

## 2024-01-05 MED ORDER — VANCOMYCIN HCL 1250 MG/250ML IV SOLN
1250.0000 mg | INTRAVENOUS | Status: DC
  Administered 2024-01-05 – 2024-01-07 (×3): 1250 mg via INTRAVENOUS
  Filled 2024-01-05 (×3): qty 250

## 2024-01-05 MED ORDER — PROCHLORPERAZINE EDISYLATE 10 MG/2ML IJ SOLN
10.0000 mg | Freq: Four times a day (QID) | INTRAMUSCULAR | Status: DC | PRN
  Administered 2024-01-05 – 2024-01-06 (×2): 10 mg via INTRAVENOUS
  Filled 2024-01-05 (×2): qty 2

## 2024-01-05 MED ORDER — TRAZODONE HCL 50 MG PO TABS
50.0000 mg | ORAL_TABLET | Freq: Every evening | ORAL | Status: DC | PRN
  Administered 2024-01-06 – 2024-01-07 (×2): 50 mg via ORAL
  Filled 2024-01-05 (×2): qty 1

## 2024-01-05 MED ORDER — ENSURE PLUS HIGH PROTEIN PO LIQD
237.0000 mL | Freq: Two times a day (BID) | ORAL | Status: DC
  Administered 2024-01-06 – 2024-01-09 (×6): 237 mL via ORAL

## 2024-01-05 MED ORDER — HYDROMORPHONE HCL 1 MG/ML IJ SOLN
1.0000 mg | INTRAMUSCULAR | Status: DC | PRN
  Administered 2024-01-05 – 2024-01-06 (×3): 1 mg via INTRAVENOUS
  Filled 2024-01-05 (×3): qty 1

## 2024-01-05 MED ORDER — LACTATED RINGERS IV SOLN: INTRAVENOUS | Status: AC

## 2024-01-05 MED ORDER — MAGNESIUM SULFATE 2 GM/50ML IV SOLN
2.0000 g | Freq: Once | INTRAVENOUS | Status: AC
  Administered 2024-01-05: 2 g via INTRAVENOUS
  Filled 2024-01-05: qty 50

## 2024-01-05 MED ORDER — POLYETHYLENE GLYCOL 3350 17 G PO PACK: 17.0000 g | PACK | Freq: Every day | ORAL | Status: DC | PRN

## 2024-01-05 MED ORDER — FERROUS SULFATE 325 (65 FE) MG PO TABS
325.0000 mg | ORAL_TABLET | Freq: Every day | ORAL | Status: DC
  Administered 2024-01-06 – 2024-01-09 (×4): 325 mg via ORAL
  Filled 2024-01-05 (×4): qty 1

## 2024-01-05 MED ORDER — SENNA 8.6 MG PO TABS: 1.0000 | ORAL_TABLET | Freq: Every evening | ORAL | Status: DC | PRN

## 2024-01-05 MED ORDER — POTASSIUM CHLORIDE 10 MEQ/100ML IV SOLN
10.0000 meq | INTRAVENOUS | Status: AC
  Administered 2024-01-05 (×4): 10 meq via INTRAVENOUS
  Filled 2024-01-05 (×4): qty 100

## 2024-01-05 MED ORDER — ALBUTEROL SULFATE HFA 108 (90 BASE) MCG/ACT IN AERS: 1.0000 | INHALATION_SPRAY | Freq: Four times a day (QID) | RESPIRATORY_TRACT | Status: DC | PRN

## 2024-01-05 MED ORDER — ALBUTEROL SULFATE (2.5 MG/3ML) 0.083% IN NEBU: 2.5000 mg | INHALATION_SOLUTION | Freq: Four times a day (QID) | RESPIRATORY_TRACT | Status: DC | PRN

## 2024-01-05 MED ORDER — NICOTINE 14 MG/24HR TD PT24
14.0000 mg | MEDICATED_PATCH | Freq: Every day | TRANSDERMAL | Status: DC
  Administered 2024-01-05 – 2024-01-09 (×5): 14 mg via TRANSDERMAL
  Filled 2024-01-05 (×5): qty 1

## 2024-01-05 MED ORDER — SENNA 8.6 MG PO TABS
1.0000 | ORAL_TABLET | Freq: Two times a day (BID) | ORAL | Status: DC
  Administered 2024-01-05 – 2024-01-09 (×7): 8.6 mg via ORAL
  Filled 2024-01-05 (×7): qty 1

## 2024-01-05 MED ORDER — POLYETHYLENE GLYCOL 3350 17 G PO PACK
17.0000 g | PACK | Freq: Every day | ORAL | Status: DC
  Filled 2024-01-05: qty 1

## 2024-01-05 MED ORDER — OXYCODONE HCL 5 MG PO TABS
20.0000 mg | ORAL_TABLET | ORAL | Status: DC | PRN
  Administered 2024-01-05 – 2024-01-09 (×14): 20 mg via ORAL
  Filled 2024-01-05 (×14): qty 4

## 2024-01-05 NOTE — Progress Notes (Signed)
   01/05/24 1210  TOC Brief Assessment  Patient has primary care physician Yes Brenita, Bascom RAMAN, NP)  Home environment has been reviewed Home  Prior level of function: Independent  Prior/Current Home Services No current home services  Social Drivers of Health Review SDOH reviewed no interventions necessary  Readmission risk has been reviewed Yes  Transition of care needs no transition of care needs at this time

## 2024-01-05 NOTE — Progress Notes (Signed)
 PROGRESS NOTE    Troy Mclaughlin  FMW:[id number] DOB: Nov 08, 1955 DOA: 01/04/2024 PCP: Oley Bascom RAMAN, NP    Chief Complaint  Patient presents with   Emesis   Weakness    Brief Narrative:  Troy Mclaughlin is a 68 y.o. 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    Assessment & Plan:   Principal Problem:   Cancer associated pain Active Problems:   Hypokalemia   Essential hypertension   Nausea and vomiting   Malignant neoplasm of prostate (HCC)   Hypomagnesemia  #1 uncontrolled cancer associated pain -Patient presented with pain on relief on home regimen. - CT angiogram chest, CT abdomen and pelvis with diffuse sclerotic bony metastases noted. - Continue oxycodone  15 mg p.o. twice daily. - Continue Oxy IR 15 mg every 4 hours as needed. - Continue IV Dilaudid  1 mg every 3 hours as needed for severe breakthrough pain. - Hold bowel regimen for now as patient with watery liquid stools. - Palliative care consultation placed for goals of care and symptom management.  2.  Intractable nausea and vomiting -??Etiology - CT abdomen and pelvis negative for bowel obstruction or ileus. -Patient with clinical improvement. - Continue IV antiemetics, IV fluids, supportive care.  3.  Hypokalemia/hypomagnesemia -Secondary to GI losses. - Potassium at 3.4 today, magnesium  at 1.8. - Magnesium  sulfate 2 g IV x 1. - K-Dur 10 meq IV every 1 hour x 4 runs.  4.  Stage IV prostate cancer with diffuse osseous metastases -Being followed by Dr. Tina with oncology. - Patient noted to have been started on Pluvicto , first treatment 12/21/2023. - Patient being followed by oncology who are recommending consultation with hospice and arrangement for home hospice prior to discharge. - Palliative care  consulted.  5.  Leukopenia/lymphocytopenia -Likely related to Pluvicto . - Follow.  6.  Hypertension -BP stable. - Continue to hold antihypertensive medications.  7.  Anemia of chronic disease -Patient noted to have reported to admitting physician seeing dark black stools recently. - FOBT pending. - Prior FOBT 11/30/2022 noted to be negative. - Hemoglobin currently stable at 8.0. - Follow H&H. - Transfusion threshold hemoglobin < 8.     DVT prophylaxis: SCDs Code Status: Full Family Communication: Updated patient.  No family at bedside. Disposition: Likely home when clinically improved, pain control, tolerating oral intake.  Status is: Observation The patient remains OBS appropriate and will d/c before 2 midnights.   Consultants:  Hematology/oncology: Dr. Tina 01/04/2024 Palliative care  Procedures: CT angiogram chest 01/04/2024 CT abdomen and pelvis 01/04/2024 Chest x-ray 01/04/2024   Antimicrobials:  Anti-infectives (From admission, onward)    None         Subjective: Patient laying in bed.  Patient does endorse some nausea, no further emesis.  States he ate part of a waffle this morning and was able to keep it down.  Still with complaints of diffuse rib pain.  No significant change with shortness of breath.  States having liquid stools.  Patient also with complaints of generalized weakness.  Objective: Vitals:   01/05/24 0202 01/05/24 0518 01/05/24 1138 01/05/24 1527  BP: 101/61 117/70 118/70 (!) 170/79  Pulse: (!) 56 (!) 53 (!) 50 (!) 55  Resp: 16 16 18 18   Temp: 97.6 F (36.4 C) (!) 97.5 F (36.4 C) 97.6 F (36.4 C) 97.8 F (36.6  C)  TempSrc: Oral Oral Oral   SpO2: 100% 100% 100% 99%  Weight:      Height:        Intake/Output Summary (Last 24 hours) at 01/05/2024 1548 Last data filed at 01/05/2024 0954 Gross per 24 hour  Intake 2238.33 ml  Output 400 ml  Net 1838.33 ml   Filed Weights   01/04/24 1456  Weight: 56.7 kg     Examination:  General exam: Appears calm and comfortable  Respiratory system: Clear to auscultation.  No wheezes, no crackles, no rhonchi.  Fair air movement.  Speaking in full sentences.  Respiratory effort normal. Cardiovascular system: S1 & S2 heard, RRR. No JVD, murmurs, rubs, gallops or clicks. No pedal edema. Gastrointestinal system: Abdomen is nondistended, soft and nontender. No organomegaly or masses felt. Normal bowel sounds heard. Central nervous system: Alert and oriented.  Moving extremities spontaneously.  No focal neurological deficits. Extremities: Symmetric 5 x 5 power. Skin: No rashes, lesions or ulcers Psychiatry: Judgement and insight appear normal. Mood & affect appropriate.     Data Reviewed: I have personally reviewed following labs and imaging studies  CBC: Recent Labs  Lab 01/04/24 1347 01/04/24 1618 01/05/24 0512  WBC 2.8*  --  2.8*  NEUTROABS  --  2.1  --   HGB 8.7*  --  8.0*  HCT 27.6*  --  24.8*  MCV 69.5*  --  70.5*  PLT 347  --  290    Basic Metabolic Panel: Recent Labs  Lab 01/04/24 1347 01/04/24 1618 01/05/24 0512  NA 132*  --  131*  K 2.7*  --  3.4*  CL 97*  --  100  CO2 24  --  24  GLUCOSE 96  --  91  BUN 11  --  8  CREATININE 0.86  --  0.72  CALCIUM  8.5*  --  8.5*  MG  --  1.6* 1.8    GFR: Estimated Creatinine Clearance: 72.8 mL/min (by C-G formula based on SCr of 0.72 mg/dL).  Liver Function Tests: Recent Labs  Lab 01/04/24 1347  AST 13*  ALT 8  ALKPHOS 540*  BILITOT 0.5  PROT 7.3  ALBUMIN 3.3*    CBG: No results for input(s): GLUCAP in the last 168 hours.   Recent Results (from the past 240 hours)  Culture, blood (routine x 2)     Status: None (Preliminary result)   Collection Time: 01/04/24  4:18 PM   Specimen: BLOOD  Result Value Ref Range Status   Specimen Description   Final    BLOOD LEFT ANTECUBITAL Performed at [facility], [address] [place], [place] [address]     Special Requests   Final    BOTTLES DRAWN AEROBIC AND ANAEROBIC Blood Culture adequate volume Performed at [facility], [address], [place], [place] [address]    Culture   Final    NO GROWTH < 12 HOURS Performed at [facility], [address] [place], [place] [address]    Report Status PENDING  Incomplete  Resp panel by RT-PCR (RSV, Flu A&B, Covid) Anterior Nasal Swab     Status: None   Collection Time: 01/04/24  4:31 PM   Specimen: Anterior Nasal Swab  Result Value Ref Range Status   SARS Coronavirus 2 by RT PCR NEGATIVE NEGATIVE Final    Comment: (NOTE) SARS-CoV-2 target nucleic acids are NOT DETECTED.  The SARS-CoV-2 RNA is generally detectable in upper respiratory specimens during the  acute phase of infection. The lowest concentration of SARS-CoV-2 viral copies this assay can detect is 138 copies/mL. A negative result does not preclude SARS-Cov-2 infection and should not be used as the sole basis for treatment or other patient management decisions. A negative result may occur with  improper specimen collection/handling, submission of specimen other than nasopharyngeal swab, presence of viral mutation(s) within the areas targeted by this assay, and inadequate number of viral copies(<138 copies/mL). A negative result must be combined with clinical observations, patient history, and epidemiological information. The expected result is Negative.  Fact Sheet for Patients:  BloggerCourse.com  Fact Sheet for Healthcare Providers:  SeriousBroker.it  This test is no t yet approved or cleared by the [place]  FDA and  has been authorized for detection and/or diagnosis of SARS-CoV-2 by FDA under an Emergency Use Authorization (EUA). This EUA will remain  in effect (meaning this test can be used) for the duration of the COVID-19 declaration under Section 564(b)(1) of the Act, 21 U.S.C.section  360bbb-3(b)(1), unless the authorization is terminated  or revoked sooner.       Influenza A by PCR NEGATIVE NEGATIVE Final   Influenza B by PCR NEGATIVE NEGATIVE Final    Comment: (NOTE) The Xpert Xpress SARS-CoV-2/FLU/RSV plus assay is intended as an aid in the diagnosis of influenza from Nasopharyngeal swab specimens and should not be used as a sole basis for treatment. Nasal washings and aspirates are unacceptable for Xpert Xpress SARS-CoV-2/FLU/RSV testing.  Fact Sheet for Patients: BloggerCourse.com  Fact Sheet for Healthcare Providers: SeriousBroker.it  This test is not yet approved or cleared by the [place]  FDA and has been authorized for detection and/or diagnosis of SARS-CoV-2 by FDA under an Emergency Use Authorization (EUA). This EUA will remain in effect (meaning this test can be used) for the duration of the COVID-19 declaration under Section 564(b)(1) of the Act, 21 U.S.C. section 360bbb-3(b)(1), unless the authorization is terminated or revoked.     Resp Syncytial Virus by PCR NEGATIVE NEGATIVE Final    Comment: (NOTE) Fact Sheet for Patients: BloggerCourse.com  Fact Sheet for Healthcare Providers: SeriousBroker.it  This test is not yet approved or cleared by the [place]  FDA and has been authorized for detection and/or diagnosis of SARS-CoV-2 by FDA under an Emergency Use Authorization (EUA). This EUA will remain in effect (meaning this test can be used) for the duration of the COVID-19 declaration under Section 564(b)(1) of the Act, 21 U.S.C. section 360bbb-3(b)(1), unless the authorization is terminated or revoked.  Performed at [facility], [address], [place], [place] [address]   Culture, blood (routine x 2)     Status: Abnormal (Preliminary result)   Collection Time: 01/04/24 10:19 PM   Specimen: BLOOD  Result  Value Ref Range Status   Specimen Description   Final    BLOOD BLOOD RIGHT HAND Performed at [facility], [address], [place], [place] [address]    Special Requests (A)  Final    AEROCOCCUS SPECIES Blood Culture results may not be optimal due to an inadequate volume of blood received in culture bottles Performed at [facility], [address] [place], [place] [address]    Culture   Final    NO GROWTH < 12 HOURS Performed at [facility], [address], [place], [place] [address]    Report Status PENDING  Incomplete         Radiology Studies: CT Angio Chest PE W/Cm &/  Or Wo Cm Result Date: 01/04/2024 CLINICAL DATA:  Abdominal pain, cough, fever, history of metastatic prostate cancer EXAM: CT ANGIOGRAPHY CHEST CT ABDOMEN AND PELVIS WITH CONTRAST TECHNIQUE: Multidetector CT imaging of the chest was performed using the standard protocol during bolus administration of intravenous contrast. Multiplanar CT image reconstructions and MIPs were obtained to evaluate the vascular anatomy. Multidetector CT imaging of the abdomen and pelvis was performed using the standard protocol during bolus administration of intravenous contrast. RADIATION DOSE REDUCTION: This exam was performed according to the departmental dose-optimization program which includes automated exposure control, adjustment of the mA and/or kV according to patient size and/or use of iterative reconstruction technique. CONTRAST:  OMNIPAQUE  IOHEXOL  350 MG/ML SOLN COMPARISON:  12/14/2023 FINDINGS: CTA CHEST FINDINGS Cardiovascular: This is a technically adequate evaluation of the pulmonary vasculature. No filling defects or pulmonary emboli. The heart is unremarkable without pericardial effusion. Normal caliber of the thoracic aorta. Stable atherosclerosis of the coronary vasculature. Mediastinum/Nodes: No enlarged mediastinal, hilar, or axillary lymph nodes. Thyroid  gland, trachea, and  esophagus demonstrate no significant findings. Stable postsurgical changes at the gastroesophageal junction. Lungs/Pleura: Stable emphysema. Chronic volume loss and consolidation again noted within the left upper and left lower lobe, consistent with areas of scarring and rounded atelectasis. No acute airspace disease, effusion, or pneumothorax. Central airways are patent. Musculoskeletal: Diffuse sclerotic bony metastases are again noted, not appreciably changed since the 12/14/2023 exam. No evidence of pathologic fracture. Review of the MIP images confirms the above findings. CT ABDOMEN and PELVIS FINDINGS Hepatobiliary: Stable hemangioma right lobe liver. Multiple scattered subcentimeter liver cysts are unchanged. Gallbladder is decompressed without evidence of cholelithiasis or cholecystitis. No biliary duct dilation. Pancreas: Unremarkable. No pancreatic ductal dilatation or surrounding inflammatory changes. Spleen: Normal in size without focal abnormality. Adrenals/Urinary Tract: Stable 5 mm nonobstructing calculus lower pole left kidney. No obstructive uropathy within either kidney. The adrenals are stable. Bladder is unremarkable. Stomach/Bowel: No bowel obstruction or ileus. Normal appendix right lower quadrant. Scattered colonic diverticulosis without evidence of acute diverticulitis. No bowel wall thickening or inflammatory change. Vascular/Lymphatic: Stable atherosclerosis of the aorta and its branches. No pathologic adenopathy within the abdomen or pelvis. Reproductive: Fiduciary markers again noted within the prostate, which is not enlarged. Other: No free fluid or free intraperitoneal gas. No abdominal wall hernia. Musculoskeletal: Diffuse sclerotic bony metastases are again identified, without significant change since the prior exam. No evidence of pathologic fracture. Review of the MIP images confirms the above findings. IMPRESSION: 1. No evidence of pulmonary embolus. 2. Diffuse sclerotic bony  metastases consistent with known history of metastatic prostate cancer. 3. Stable areas of scarring and rounded atelectasis within the left lung. No acute airspace disease. 4. Colonic diverticulosis without evidence of acute diverticulitis. 5.  Aortic Atherosclerosis (ICD10-I70.0). Electronically Signed   By: Ozell Daring M.D.   On: 01/04/2024 17:56   CT ABDOMEN PELVIS W CONTRAST Result Date: 01/04/2024 CLINICAL DATA:  Abdominal pain, cough, fever, history of metastatic prostate cancer EXAM: CT ANGIOGRAPHY CHEST CT ABDOMEN AND PELVIS WITH CONTRAST TECHNIQUE: Multidetector CT imaging of the chest was performed using the standard protocol during bolus administration of intravenous contrast. Multiplanar CT image reconstructions and MIPs were obtained to evaluate the vascular anatomy. Multidetector CT imaging of the abdomen and pelvis was performed using the standard protocol during bolus administration of intravenous contrast. RADIATION DOSE REDUCTION: This exam was performed according to the departmental dose-optimization program which includes automated exposure control, adjustment of the mA and/or kV according to patient  size and/or use of iterative reconstruction technique. CONTRAST:  OMNIPAQUE  IOHEXOL  350 MG/ML SOLN COMPARISON:  12/14/2023 FINDINGS: CTA CHEST FINDINGS Cardiovascular: This is a technically adequate evaluation of the pulmonary vasculature. No filling defects or pulmonary emboli. The heart is unremarkable without pericardial effusion. Normal caliber of the thoracic aorta. Stable atherosclerosis of the coronary vasculature. Mediastinum/Nodes: No enlarged mediastinal, hilar, or axillary lymph nodes. Thyroid  gland, trachea, and esophagus demonstrate no significant findings. Stable postsurgical changes at the gastroesophageal junction. Lungs/Pleura: Stable emphysema. Chronic volume loss and consolidation again noted within the left upper and left lower lobe, consistent with areas of scarring  and rounded atelectasis. No acute airspace disease, effusion, or pneumothorax. Central airways are patent. Musculoskeletal: Diffuse sclerotic bony metastases are again noted, not appreciably changed since the 12/14/2023 exam. No evidence of pathologic fracture. Review of the MIP images confirms the above findings. CT ABDOMEN and PELVIS FINDINGS Hepatobiliary: Stable hemangioma right lobe liver. Multiple scattered subcentimeter liver cysts are unchanged. Gallbladder is decompressed without evidence of cholelithiasis or cholecystitis. No biliary duct dilation. Pancreas: Unremarkable. No pancreatic ductal dilatation or surrounding inflammatory changes. Spleen: Normal in size without focal abnormality. Adrenals/Urinary Tract: Stable 5 mm nonobstructing calculus lower pole left kidney. No obstructive uropathy within either kidney. The adrenals are stable. Bladder is unremarkable. Stomach/Bowel: No bowel obstruction or ileus. Normal appendix right lower quadrant. Scattered colonic diverticulosis without evidence of acute diverticulitis. No bowel wall thickening or inflammatory change. Vascular/Lymphatic: Stable atherosclerosis of the aorta and its branches. No pathologic adenopathy within the abdomen or pelvis. Reproductive: Fiduciary markers again noted within the prostate, which is not enlarged. Other: No free fluid or free intraperitoneal gas. No abdominal wall hernia. Musculoskeletal: Diffuse sclerotic bony metastases are again identified, without significant change since the prior exam. No evidence of pathologic fracture. Review of the MIP images confirms the above findings. IMPRESSION: 1. No evidence of pulmonary embolus. 2. Diffuse sclerotic bony metastases consistent with known history of metastatic prostate cancer. 3. Stable areas of scarring and rounded atelectasis within the left lung. No acute airspace disease. 4. Colonic diverticulosis without evidence of acute diverticulitis. 5.  Aortic Atherosclerosis  (ICD10-I70.0). Electronically Signed   By: Ozell Daring M.D.   On: 01/04/2024 17:56   DG Chest Port 1 View Result Date: 01/04/2024 CLINICAL DATA:  Cough, fever. EXAM: PORTABLE CHEST 1 VIEW COMPARISON:  December 14, 2023. FINDINGS: The heart size and mediastinal contours are within normal limits. Old right rib fractures are noted. Sclerotic densities are noted throughout visualized skeleton suggesting metastatic disease. Stable left midlung opacity is noted most consistent with scarring or atelectasis. Right lung is unremarkable. IMPRESSION: Stable diffuse sclerotic densities consistent with osseous metastases. Stable left midlung opacity is noted consistent with scarring or atelectasis. Electronically Signed   By: Lynwood Landy Raddle M.D.   On: 01/04/2024 16:20        Scheduled Meds:  oxyCODONE   15 mg Oral Q12H   Continuous Infusions:  lactated ringers  100 mL/hr at 01/05/24 1135     LOS: 0 days    Time spent: 35 minutes    Toribio Hummer, MD [facility]   To contact the attending provider between 7A-7P or the covering provider during after hours 7P-7A, please log into the web site [email or web address] and access using universal Marceline password for that web site. If you do not have the password, please call the hospital operator.  01/05/2024, 3:48 PM

## 2024-01-05 NOTE — Progress Notes (Signed)
 PHARMACY - PHYSICIAN COMMUNICATION CRITICAL VALUE ALERT - BLOOD CULTURE IDENTIFICATION (BCID)  IMIR BRUMBACH is an 68 y.o. male with metastatic cancer who presented to [facility] on 01/04/2024 with a chief complaint of generalized weakness and n/v. One of three blood culture collected on 8/14 at ~4p now  ha GPC in clusters (BCID= staph species).  Name of physician (or Provider) Contacted: Dr. Sebastian  Current antibiotics: not on abx  Changes to prescribed antibiotics recommended:  - Per Dr. Sebastian, start vancomycin  for now since patient is immunocompromised.  - vancomycin  1250 mg IV q24h for est AUC 467   Results for orders placed or performed during the hospital encounter of 01/04/24  Blood Culture ID Panel (Reflexed) (Collected: 01/04/2024  4:18 PM)  Result Value Ref Range   Enterococcus faecalis NOT DETECTED NOT DETECTED   Enterococcus Faecium NOT DETECTED NOT DETECTED   Listeria monocytogenes NOT DETECTED NOT DETECTED   Staphylococcus species DETECTED (A) NOT DETECTED   Staphylococcus aureus (BCID) NOT DETECTED NOT DETECTED   Staphylococcus epidermidis NOT DETECTED NOT DETECTED   Staphylococcus lugdunensis NOT DETECTED NOT DETECTED   Streptococcus species NOT DETECTED NOT DETECTED   Streptococcus agalactiae NOT DETECTED NOT DETECTED   Streptococcus pneumoniae NOT DETECTED NOT DETECTED   Streptococcus pyogenes NOT DETECTED NOT DETECTED   A.calcoaceticus-baumannii NOT DETECTED NOT DETECTED   Bacteroides fragilis NOT DETECTED NOT DETECTED   Enterobacterales NOT DETECTED NOT DETECTED   Enterobacter cloacae complex NOT DETECTED NOT DETECTED   Escherichia coli NOT DETECTED NOT DETECTED   Klebsiella aerogenes NOT DETECTED NOT DETECTED   Klebsiella oxytoca NOT DETECTED NOT DETECTED   Klebsiella pneumoniae NOT DETECTED NOT DETECTED   Proteus species NOT DETECTED NOT DETECTED   Salmonella species NOT DETECTED NOT DETECTED   Serratia marcescens NOT DETECTED NOT DETECTED    Haemophilus influenzae NOT DETECTED NOT DETECTED   Neisseria meningitidis NOT DETECTED NOT DETECTED   Pseudomonas aeruginosa NOT DETECTED NOT DETECTED   Stenotrophomonas maltophilia NOT DETECTED NOT DETECTED   Candida albicans NOT DETECTED NOT DETECTED   Candida auris NOT DETECTED NOT DETECTED   Candida glabrata NOT DETECTED NOT DETECTED   Candida krusei NOT DETECTED NOT DETECTED   Candida parapsilosis NOT DETECTED NOT DETECTED   Candida tropicalis NOT DETECTED NOT DETECTED   Cryptococcus neoformans/gattii NOT DETECTED NOT DETECTED    Osie Iantha SQUIBB 01/05/2024  5:33 PM

## 2024-01-05 NOTE — Progress Notes (Signed)
   01/05/24 1500  Spiritual Encounters  Type of Visit Attempt (pt unavailable)  Conversation partners present during encounter Social worker/Care management/TOC  Referral source Nurse (RN/NT/LPN)  OnCall Visit No   I responded to a spiritual care consult by Mac Chess, RN. The stated need was for HCPOA, but also aware of TOC likely to hospice and less favorable prognosis.  This chaplain consulted with [name], Case Mgr. I also did stop by room but Mr. Sliker is resting and appears to have some ongoing nausea based on emesis bag at hand. All considered, I am letting Mr. Prashad rest and also to have some time to process information on his own. I will attempt to check back before 5pm to meet brother or or family and to make known ongoing support availability.  Whisper Kurka L. Fredrica, M.Div [phone]
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
## 2024-01-05 NOTE — Care Management Obs Status (Signed)
 MEDICARE OBSERVATION STATUS NOTIFICATION   Patient Details  Name: Troy Mclaughlin MRN: [id number] Date of Birth: 21-Oct-1955   Medicare Observation Status Notification Given:  Yes    [name], RN 01/05/2024, 3:45 PM

## 2024-01-05 NOTE — TOC Initial Note (Signed)
 Transition of Care [facility]) - Initial/Assessment Note    Patient Details  Name: Troy Mclaughlin MRN: [id number] Date of Birth: Sep 02, 1955  Transition of Care [facility]) CM/SW Contact:    [name], RN Phone Number:[phone]  01/05/2024, 12:11 PM  Clinical Narrative:                 [facility] acknowledges referral for hospice. CM at bedside introduces self and explained referral. Patient states that he is waiting for family to come in to discuss and will get back with CM when he is ready to make a decision. CM has left list for choice and phone number to call CM. CM will continue to follow.         Patient Goals and CMS Choice            Expected Discharge Plan and Services                                              Prior Living Arrangements/Services                       Activities of Daily Living   ADL Screening (condition at time of admission) Independently performs ADLs?: Yes (appropriate for developmental age) Is the patient deaf or have difficulty hearing?: No Does the patient have difficulty seeing, even when wearing glasses/contacts?: No Does the patient have difficulty concentrating, remembering, or making decisions?: No  Permission Sought/Granted                  Emotional Assessment              Admission diagnosis:  Hypokalemia [E87.6] Hypomagnesemia [E83.42] Cancer associated pain [G89.3] Nausea and vomiting, unspecified vomiting type [R11.2] Patient Active Problem List   Diagnosis Date Noted   Cancer related pain 12/15/2023   Hypophosphatemia 12/15/2023   Anemia, 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   Hyponatremia 12/14/2023   Intractable pain 11/09/2023   Diarrhea 11/09/2023   Goals of care, counseling/discussion 07/17/2023   CAP (community acquired pneumonia) 07/13/2023   Hypomagnesemia 07/13/2023   Viral URI 07/13/2023   At risk for side effect of medication 06/19/2023   Cancer associated pain 05/22/2023   B12  deficiency anemia 05/21/2023   Genetic testing 05/03/2023   Microcytic anemia 04/10/2023   Lymphopenia 04/10/2023   Metastatic malignant neoplasm (HCC) 03/09/2023   Anemia of chronic disease 11/30/2021   Mixed 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) 11/26/2021   BPH (benign prostatic hyperplasia) 11/26/2021   Chest pain 11/26/2021   SBO (small bowel obstruction) (HCC) 11/25/2021   Malignant neoplasm of prostate (HCC) 12/15/2020   Influenza vaccine needed 04/10/2020   Protein-calorie malnutrition, severe 04/01/2019   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   History of stomach ulcers    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HCC)    Diabetes mellitus without complication (HCC)    Alcohol  abuse    Acute gastric ulcer with hemorrhage    GI bleed 03/30/2019   Abdominal pain, left lower quadrant    Blood in the stool    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   Nausea and vomiting 05/27/2018   Hyperkalemia 05/27/2018   Erectile dysfunction 02/09/2017   Tobacco dependence 02/09/2017   Hyperlipidemia 01/18/2017   Gastroesophageal reflux disease with esophagitis    Hypokalemia    Renal stone  Type 2 diabetes mellitus without complication, without long-term current use of insulin  (HCC) 08/16/2016   Abdominal wall pain    Coronary artery disease    Atypical chest pain    Essential hypertension    History of ST elevation myocardial infarction (STEMI) 07/06/2013   History of small bowel obstruction 07/29/2008   Tobacco abuse 04/15/2008   GLAUCOMA 04/15/2008   PCP:  Oley Bascom RAMAN, NP Pharmacy:   [facility] [address] [place] [address] Phone: [phone] Fax: [phone]  [id number] [place], [place] - [address] AT [facility] [address] [place] [address] Phone: [phone] Fax: [phone]     Social Drivers of Health (SDOH) Social History: SDOH  Screenings   Food Insecurity: Food Insecurity Present (01/04/2024)  Housing: Low Risk  (01/04/2024)  Transportation Needs: No Transportation Needs (01/04/2024)  Recent Concern: Transportation Needs - Unmet Transportation Needs (11/11/2023)  Utilities: Not At Risk (01/04/2024)  Alcohol  Screen: Low Risk  (12/25/2020)  Depression (PHQ2-9): Medium Risk (06/07/2023)  Financial Resource Strain: High Risk (05/05/2020)  Physical Activity: Insufficiently Active (12/25/2020)  Social Connections: Moderately Isolated (01/04/2024)  Stress: No Stress Concern Present (12/25/2020)  Tobacco Use: High Risk (01/04/2024)   SDOH Interventions:     Readmission Risk Interventions    11/10/2023    1:21 PM 07/19/2023   11:17 AM 07/18/2023    4:10 PM  Readmission Risk Prevention Plan  Transportation Screening Complete Complete Complete  PCP or Specialist Appt within 5-7 Days  Complete   Home Care Screening  Complete   Medication Review (RN CM)  Complete   Medication Review (RN Care Manager) Complete    PCP or Specialist appointment within 3-5 days of discharge Complete    HRI or Home Care Consult Complete    SW Recovery Care/Counseling Consult Complete    Palliative Care Screening Not Applicable    Skilled Nursing Facility Not Applicable

## 2024-01-06 DIAGNOSIS — Z515 Encounter for palliative care: Secondary | ICD-10-CM

## 2024-01-06 DIAGNOSIS — K5900 Constipation, unspecified: Secondary | ICD-10-CM | POA: Diagnosis not present

## 2024-01-06 DIAGNOSIS — C7951 Secondary malignant neoplasm of bone: Secondary | ICD-10-CM

## 2024-01-06 DIAGNOSIS — Z7189 Other specified counseling: Secondary | ICD-10-CM | POA: Diagnosis not present

## 2024-01-06 DIAGNOSIS — Z79899 Other long term (current) drug therapy: Secondary | ICD-10-CM

## 2024-01-06 LAB — RENAL FUNCTION PANEL
Albumin: 2.6 g/dL — ABNORMAL LOW (ref 3.5–5.0)
Anion gap: 7 (ref 5–15)
BUN: <5 mg/dL — ABNORMAL LOW (ref 8–23)
CO2: 23 mmol/L (ref 22–32)
Calcium: 8.4 mg/dL — ABNORMAL LOW (ref 8.9–10.3)
Chloride: 102 mmol/L (ref 98–111)
Creatinine, Ser: 0.62 mg/dL (ref 0.61–1.24)
GFR, Estimated: >60 mL/min (ref >60)
Glucose, Bld: 90 mg/dL (ref 70–99)
Phosphorus: 2.4 mg/dL — ABNORMAL LOW (ref 2.5–4.6)
Potassium: 4 mmol/L (ref 3.5–5.1)
Sodium: 132 mmol/L — ABNORMAL LOW (ref 135–145)

## 2024-01-06 LAB — CBC WITH DIFFERENTIAL/PLATELET
Abs Granulocyte: 2.7 K/uL (ref 1.5–6.5)
Abs Immature Granulocytes: 0.01 K/uL (ref 0.00–0.07)
Basophils Absolute: 0 K/uL (ref 0.0–0.1)
Basophils Relative: 0 %
Eosinophils Absolute: 0.1 K/uL (ref 0.0–0.5)
Eosinophils Relative: 4 %
HCT: 25.4 % — ABNORMAL LOW (ref 39.0–52.0)
Hemoglobin: 8 g/dL — ABNORMAL LOW (ref 13.0–17.0)
Immature Granulocytes: 0 %
Lymphocytes Relative: 9 %
Lymphs Abs: 0.3 K/uL — ABNORMAL LOW (ref 0.7–4.0)
MCH: 22 pg — ABNORMAL LOW (ref 26.0–34.0)
MCHC: 31.5 g/dL (ref 30.0–36.0)
MCV: 69.8 fL — ABNORMAL LOW (ref 80.0–100.0)
Monocytes Absolute: 0.4 K/uL (ref 0.1–1.0)
Monocytes Relative: 12 %
Neutro Abs: 2.7 K/uL (ref 1.7–7.7)
Neutrophils Relative %: 75 %
Platelets: 295 K/uL (ref 150–400)
RBC: 3.64 MIL/uL — ABNORMAL LOW (ref 4.22–5.81)
RDW: 17.3 % — ABNORMAL HIGH (ref 11.5–15.5)
WBC: 3.6 K/uL — ABNORMAL LOW (ref 4.0–10.5)
nRBC: 0.5 % — ABNORMAL HIGH (ref 0.0–0.2)

## 2024-01-06 LAB — MAGNESIUM: Magnesium: 1.7 mg/dL (ref 1.7–2.4)

## 2024-01-06 MED ORDER — AMLODIPINE BESYLATE 10 MG PO TABS
10.0000 mg | ORAL_TABLET | Freq: Every morning | ORAL | Status: DC
  Administered 2024-01-06: 10 mg via ORAL
  Filled 2024-01-06: qty 1

## 2024-01-06 MED ORDER — OXYCODONE HCL ER 15 MG PO T12A
30.0000 mg | EXTENDED_RELEASE_TABLET | Freq: Three times a day (TID) | ORAL | Status: DC
  Administered 2024-01-06 – 2024-01-09 (×9): 30 mg via ORAL
  Filled 2024-01-06 (×9): qty 2

## 2024-01-06 MED ORDER — PANTOPRAZOLE SODIUM 40 MG PO TBEC
40.0000 mg | DELAYED_RELEASE_TABLET | Freq: Two times a day (BID) | ORAL | Status: DC
  Administered 2024-01-06 – 2024-01-09 (×7): 40 mg via ORAL
  Filled 2024-01-06 (×7): qty 1

## 2024-01-06 MED ORDER — PANTOPRAZOLE SODIUM 40 MG PO TBEC
40.0000 mg | DELAYED_RELEASE_TABLET | Freq: Every day | ORAL | Status: DC
  Administered 2024-01-06: 40 mg via ORAL
  Filled 2024-01-06: qty 1

## 2024-01-06 MED ORDER — LOPERAMIDE HCL 2 MG PO CAPS: 2.0000 mg | ORAL_CAPSULE | ORAL | Status: DC | PRN

## 2024-01-06 MED ORDER — MAGNESIUM SULFATE 2 GM/50ML IV SOLN
2.0000 g | Freq: Once | INTRAVENOUS | Status: AC
  Administered 2024-01-06: 2 g via INTRAVENOUS
  Filled 2024-01-06: qty 50

## 2024-01-06 MED ORDER — POLYETHYLENE GLYCOL 3350 17 G PO PACK
17.0000 g | PACK | Freq: Every day | ORAL | Status: DC
  Administered 2024-01-06: 17 g via ORAL
  Filled 2024-01-06 (×3): qty 1

## 2024-01-06 MED ORDER — CARVEDILOL 12.5 MG PO TABS: 12.5000 mg | ORAL_TABLET | Freq: Two times a day (BID) | ORAL | Status: DC

## 2024-01-06 MED ORDER — CARVEDILOL 12.5 MG PO TABS
12.5000 mg | ORAL_TABLET | Freq: Two times a day (BID) | ORAL | Status: DC
  Administered 2024-01-06 – 2024-01-09 (×7): 12.5 mg via ORAL
  Filled 2024-01-06 (×7): qty 1

## 2024-01-06 MED ORDER — MAGNESIUM SULFATE 4 GM/100ML IV SOLN: 4.0000 g | Freq: Once | INTRAVENOUS | Status: DC

## 2024-01-06 MED ORDER — SODIUM CHLORIDE 0.9 % IV BOLUS
500.0000 mL | Freq: Once | INTRAVENOUS | Status: AC
  Administered 2024-01-06: 500 mL via INTRAVENOUS

## 2024-01-06 MED ORDER — K PHOS MONO-SOD PHOS DI & MONO 155-852-130 MG PO TABS
250.0000 mg | ORAL_TABLET | Freq: Two times a day (BID) | ORAL | Status: AC
  Administered 2024-01-06 – 2024-01-08 (×6): 250 mg via ORAL
  Filled 2024-01-06 (×6): qty 1

## 2024-01-06 MED ORDER — LACTATED RINGERS IV SOLN
INTRAVENOUS | Status: AC
  Administered 2024-01-06 – 2024-01-07 (×2): 125 mL/h via INTRAVENOUS

## 2024-01-06 MED ORDER — ALUM & MAG HYDROXIDE-SIMETH 200-200-20 MG/5ML PO SUSP
30.0000 mL | Freq: Once | ORAL | Status: AC
  Administered 2024-01-06: 30 mL via ORAL
  Filled 2024-01-06: qty 30

## 2024-01-06 MED ORDER — AMLODIPINE BESYLATE 10 MG PO TABS: 10.0000 mg | ORAL_TABLET | Freq: Every morning | ORAL | Status: DC

## 2024-01-06 MED ORDER — LIDOCAINE VISCOUS HCL 2 % MT SOLN
15.0000 mL | Freq: Once | OROMUCOSAL | Status: AC
  Administered 2024-01-06: 15 mL via ORAL
  Filled 2024-01-06: qty 15

## 2024-01-06 NOTE — Plan of Care (Signed)
   Problem: Nutrition: Goal: Adequate nutrition will be maintained Outcome: Progressing   Problem: Pain Managment: Goal: General experience of comfort will improve and/or be controlled Outcome: Progressing   Problem: Safety: Goal: Ability to remain free from injury will improve Outcome: Progressing

## 2024-01-06 NOTE — Plan of Care (Signed)

## 2024-01-06 NOTE — Progress Notes (Addendum)
 PROGRESS NOTE    Troy Mclaughlin  FMW:[id number] DOB: [birth year] DOA: 01/04/2024 PCP: Oley Bascom RAMAN, NP    Chief Complaint  Patient presents with   Emesis   Weakness    Brief Narrative:  Troy Mclaughlin is a 68 y.o. 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    Assessment & Plan:   Principal Problem:   Cancer associated pain Active Problems:   Hypokalemia   Essential hypertension   Nausea and vomiting   Malignant neoplasm of prostate (HCC)   Hypomagnesemia   Constipation   Need for emotional support   DNR (do not resuscitate) discussion   ACP (advance care planning)   Counseling and coordination of care   Prostate cancer [facility])   Palliative care encounter   High risk medication use   Medication management  #1 uncontrolled cancer associated pain -Patient presented with pain on relief on home regimen. - CT angiogram chest, CT abdomen and pelvis with diffuse sclerotic bony metastases noted. - OxyContin  increased to 30 mg every 8 hours and oxycodone  increased to 20 mg every 4 hours as needed breakthrough pain per palliative care. - Continue IV Dilaudid  1 mg every 4 hours as needed for severe breakthrough pain. - Hold bowel regimen for now as patient with watery liquid stools. - Palliative care following and I appreciate your input and recommendations.   2.  Intractable nausea and vomiting -??Etiology - CT abdomen and pelvis negative for bowel obstruction or ileus. -Patient with clinical improvement. - Continue IV antiemetics, IV fluids, supportive care.  3.  Hypokalemia/hypomagnesemia/hypophosphatemia -Secondary to GI losses. - Potassium at 4.0 today, magnesium  at 1.7. -K-Phos 250 mg twice daily x 3 days. -Magnesium  sulfate 2 g IV x 1. - Repeat labs in the  AM.  4.  Stage IV prostate cancer with diffuse osseous metastases -Being followed by Dr. Tina with oncology. - Patient noted to have been started on Pluvicto , first treatment 12/21/2023. - Patient being followed by oncology who are recommending consultation with hospice and arrangement for home hospice prior to discharge. - Palliative care consulted.  5.  Leukopenia/lymphocytopenia -Likely related to Pluvicto . - Follow.  6.  Hypertension -BP noted to be controlled earlier on but started to trend up.   - Resume home regimen Coreg .  - Hold Norvasc .  7.  Anemia of chronic disease -Patient noted to have reported to admitting physician seeing dark black stools recently. - FOBT pending. - Prior FOBT 11/30/2022 noted to be negative. - Hemoglobin currently stable at 8.0. - Follow H&H. - Transfusion threshold hemoglobin < 8.  8.  Dizziness -Check orthostatics. - IV fluids.  9.??  Bacteremia versus contaminant - Preliminary blood cultures with 1/2 with gram-positive cocci with staph species with sensitivities pending. - Due to patient's immunocompromise status patient started on IV vancomycin  pending finalization of culture results.     DVT prophylaxis: SCDs Code Status: Full Family Communication: Updated patient.  No family at bedside. Disposition: Likely home when clinically improved, pain control, tolerating oral intake.  Status is: Inpatient    Consultants:  Hematology/oncology: Dr. Tina 01/04/2024 Palliative care  Procedures: CT angiogram chest 01/04/2024 CT abdomen and pelvis 01/04/2024 Chest x-ray 01/04/2024   Antimicrobials:  Anti-infectives (From admission, onward)    Start     Dose/Rate Route Frequency Ordered Stop   01/05/24 1800  vancomycin  (VANCOREADY) IVPB 1250 mg/250 mL        1,250 mg 166.7 mL/hr over 90 Minutes Intravenous Every 24 hours 01/05/24 1746           Subjective: Patient laying in bed.  Patient with complaints of loose stools.  Denies  any shortness of breath still with bilateral rib pain however states pain is a little bit better controlled with adjustment of pain medications.  Complaining of dizziness on standing.  Complaining of weakness.   Objective: Vitals:   01/05/24 1138 01/05/24 1527 01/05/24 2008 01/06/24 0622  BP: 118/70 (!) 170/79 (!) 156/77 (!) 147/78  Pulse: (!) 50 (!) 55 63 66  Resp: 18 18 20 18   Temp: 97.6 F (36.4 C) 97.8 F (36.6 C) 98.6 F (37 C) 97.7 F (36.5 C)  TempSrc: Oral     SpO2: 100% 99% 99% 98%  Weight:      Height:        Intake/Output Summary (Last 24 hours) at 01/06/2024 1259 Last data filed at 01/06/2024 0700 Gross per 24 hour  Intake 1965.49 ml  Output 1050 ml  Net 915.49 ml   Filed Weights   01/04/24 1456  Weight: 56.7 kg    Examination:  General exam: NAD. Respiratory system: CTAB.  No wheezes, no crackles, no rhonchi.  Fair air movement.  Speaking in full sentences.  Cardiovascular system: RRR no murmurs rubs or gallops.  No JVD.  No pitting lower extremity edema.  Gastrointestinal system: Abdomen is soft, nontender, nondistended, positive bowel sounds.  No rebound.  No guarding. Central nervous system: Alert and oriented. Moving extremities spontaneously.  No focal neurological deficits. Extremities: Symmetric 5 x 5 power. Skin: No rashes, lesions or ulcers Psychiatry: Judgement and insight appear normal. Mood & affect appropriate.     Data Reviewed: I have personally reviewed following labs and imaging studies  CBC: Recent Labs  Lab 01/04/24 1347 01/04/24 1618 01/05/24 0512 01/06/24 0601  WBC 2.8*  --  2.8* 3.6*  NEUTROABS  --  2.1  --  2.7  HGB 8.7*  --  8.0* 8.0*  HCT 27.6*  --  24.8* 25.4*  MCV 69.5*  --  70.5* 69.8*  PLT 347  --  290 295    Basic Metabolic Panel: Recent Labs  Lab 01/04/24 1347 01/04/24 1618 01/05/24 0512 01/06/24 0601  NA 132*  --  131* 132*  K 2.7*  --  3.4* 4.0  CL 97*  --  100 102  CO2 24  --  24 23  GLUCOSE 96  --   91 90  BUN 11  --  8 <5*  CREATININE 0.86  --  0.72 0.62  CALCIUM  8.5*  --  8.5* 8.4*  MG  --  1.6* 1.8 1.7  PHOS  --   --   --  2.4*    GFR: Estimated Creatinine Clearance: 72.8 mL/min (by C-G formula based on SCr of 0.62 mg/dL).  Liver Function Tests: Recent Labs  Lab 01/04/24 1347 01/06/24 0601  AST 13*  --   ALT 8  --   ALKPHOS 540*  --   BILITOT 0.5  --   PROT 7.3  --   ALBUMIN 3.3* 2.6*    CBG: No results for input(s): GLUCAP in the last 168 hours.   Recent Results (from the past 240 hours)  Culture, blood (routine x 2)     Status: None (Preliminary result)   Collection Time: 01/04/24  4:18 PM   Specimen:  BLOOD  Result Value Ref Range Status   Specimen Description   Final    BLOOD LEFT ANTECUBITAL Performed at [facility], [address] [place], [place] [address]    Special Requests   Final    BOTTLES DRAWN AEROBIC AND ANAEROBIC Blood Culture adequate volume Performed at [facility], [address] [place], [place] [address]    Culture  Setup Time   Final    GRAM POSITIVE COCCI IN CLUSTERS AEROBIC BOTTLE ONLY CRITICAL RESULT CALLED TO, READ BACK BY AND VERIFIED WITH: PHARMD A. PHAM [id number] @ 1728 FH    Culture   Final    GRAM POSITIVE COCCI CULTURE REINCUBATED FOR BETTER GROWTH Performed at [facility], [address], [place], [place] [address]    Report Status PENDING  Incomplete  Blood Culture ID Panel (Reflexed)     Status: Abnormal   Collection Time: 01/04/24  4:18 PM  Result Value Ref Range Status   Enterococcus faecalis NOT DETECTED NOT DETECTED Final   Enterococcus Faecium NOT DETECTED NOT DETECTED Final   Listeria monocytogenes NOT DETECTED NOT DETECTED Final   Staphylococcus species DETECTED (A) NOT DETECTED Final    Comment: CRITICAL RESULT CALLED TO, READ BACK BY AND VERIFIED WITH: PHARMD A. PHAM [id number] @ 1728 FH    Staphylococcus aureus (BCID) NOT DETECTED NOT DETECTED Final    Staphylococcus epidermidis NOT DETECTED NOT DETECTED Final   Staphylococcus lugdunensis NOT DETECTED NOT DETECTED Final   Streptococcus species NOT DETECTED NOT DETECTED Final   Streptococcus agalactiae NOT DETECTED NOT DETECTED Final   Streptococcus pneumoniae NOT DETECTED NOT DETECTED Final   Streptococcus pyogenes NOT DETECTED NOT DETECTED Final   A.calcoaceticus-baumannii NOT DETECTED NOT DETECTED Final   Bacteroides fragilis NOT DETECTED NOT DETECTED Final   Enterobacterales NOT DETECTED NOT DETECTED Final   Enterobacter cloacae complex NOT DETECTED NOT DETECTED Final   Escherichia coli NOT DETECTED NOT DETECTED Final   Klebsiella aerogenes NOT DETECTED NOT DETECTED Final   Klebsiella oxytoca NOT DETECTED NOT DETECTED Final   Klebsiella pneumoniae NOT DETECTED NOT DETECTED Final   Proteus species NOT DETECTED NOT DETECTED Final   Salmonella species NOT DETECTED NOT DETECTED Final   Serratia marcescens NOT DETECTED NOT DETECTED Final   Haemophilus influenzae NOT DETECTED NOT DETECTED Final   Neisseria meningitidis NOT DETECTED NOT DETECTED Final   Pseudomonas aeruginosa NOT DETECTED NOT DETECTED Final   Stenotrophomonas maltophilia NOT DETECTED NOT DETECTED Final   Candida albicans NOT DETECTED NOT DETECTED Final   Candida auris NOT DETECTED NOT DETECTED Final   Candida glabrata NOT DETECTED NOT DETECTED Final   Candida krusei NOT DETECTED NOT DETECTED Final   Candida parapsilosis NOT DETECTED NOT DETECTED Final   Candida tropicalis NOT DETECTED NOT DETECTED Final   Cryptococcus neoformans/gattii NOT DETECTED NOT DETECTED Final    Comment: Performed at [facility], [address], [place], [place] [address]  Resp panel by RT-PCR (RSV, Flu A&B, Covid) Anterior Nasal Swab     Status: None   Collection Time: 01/04/24  4:31 PM   Specimen: Anterior Nasal Swab  Result Value Ref Range Status   SARS Coronavirus 2 by RT PCR NEGATIVE NEGATIVE Final    Comment: (NOTE) SARS-CoV-2  target nucleic acids are NOT DETECTED.  The SARS-CoV-2 RNA is generally detectable in upper respiratory specimens during the acute phase of infection. The lowest concentration of SARS-CoV-2 viral copies this assay can detect is 138 copies/mL. A  negative result does not preclude SARS-Cov-2 infection and should not be used as the sole basis for treatment or other patient management decisions. A negative result may occur with  improper specimen collection/handling, submission of specimen other than nasopharyngeal swab, presence of viral mutation(s) within the areas targeted by this assay, and inadequate number of viral copies(<138 copies/mL). A negative result must be combined with clinical observations, patient history, and epidemiological information. The expected result is Negative.  Fact Sheet for Patients:  BloggerCourse.com  Fact Sheet for Healthcare Providers:  SeriousBroker.it  This test is no t yet approved or cleared by the [place]  FDA and  has been authorized for detection and/or diagnosis of SARS-CoV-2 by FDA under an Emergency Use Authorization (EUA). This EUA will remain  in effect (meaning this test can be used) for the duration of the COVID-19 declaration under Section 564(b)(1) of the Act, 21 U.S.C.section 360bbb-3(b)(1), unless the authorization is terminated  or revoked sooner.       Influenza A by PCR NEGATIVE NEGATIVE Final   Influenza B by PCR NEGATIVE NEGATIVE Final    Comment: (NOTE) The Xpert Xpress SARS-CoV-2/FLU/RSV plus assay is intended as an aid in the diagnosis of influenza from Nasopharyngeal swab specimens and should not be used as a sole basis for treatment. Nasal washings and aspirates are unacceptable for Xpert Xpress SARS-CoV-2/FLU/RSV testing.  Fact Sheet for Patients: BloggerCourse.com  Fact Sheet for Healthcare  Providers: SeriousBroker.it  This test is not yet approved or cleared by the [place]  FDA and has been authorized for detection and/or diagnosis of SARS-CoV-2 by FDA under an Emergency Use Authorization (EUA). This EUA will remain in effect (meaning this test can be used) for the duration of the COVID-19 declaration under Section 564(b)(1) of the Act, 21 U.S.C. section 360bbb-3(b)(1), unless the authorization is terminated or revoked.     Resp Syncytial Virus by PCR NEGATIVE NEGATIVE Final    Comment: (NOTE) Fact Sheet for Patients: BloggerCourse.com  Fact Sheet for Healthcare Providers: SeriousBroker.it  This test is not yet approved or cleared by the [place]  FDA and has been authorized for detection and/or diagnosis of SARS-CoV-2 by FDA under an Emergency Use Authorization (EUA). This EUA will remain in effect (meaning this test can be used) for the duration of the COVID-19 declaration under Section 564(b)(1) of the Act, 21 U.S.C. section 360bbb-3(b)(1), unless the authorization is terminated or revoked.  Performed at [facility], [address], [place], [place] [address]   Culture, blood (routine x 2)     Status: None (Preliminary result)   Collection Time: 01/04/24 10:19 PM   Specimen: BLOOD RIGHT HAND  Result Value Ref Range Status   Specimen Description BLOOD RIGHT HAND  Final   Special Requests   Final    BOTTLES DRAWN AEROBIC ONLY Blood Culture results may not be optimal due to an inadequate volume of blood received in culture bottles   Culture   Final    NO GROWTH 1 DAY Performed at [facility], [address], [place], [place] [address]    Report Status PENDING  Incomplete         Radiology Studies: CT Angio Chest PE W/Cm &/Or Wo Cm Result Date: 01/04/2024 CLINICAL DATA:  Abdominal pain, cough, fever, history of metastatic prostate cancer EXAM:  CT ANGIOGRAPHY CHEST CT ABDOMEN AND PELVIS WITH CONTRAST TECHNIQUE: Multidetector CT imaging of the chest was performed using the standard protocol during bolus administration of intravenous contrast. Multiplanar CT  image reconstructions and MIPs were obtained to evaluate the vascular anatomy. Multidetector CT imaging of the abdomen and pelvis was performed using the standard protocol during bolus administration of intravenous contrast. RADIATION DOSE REDUCTION: This exam was performed according to the departmental dose-optimization program which includes automated exposure control, adjustment of the mA and/or kV according to patient size and/or use of iterative reconstruction technique. CONTRAST:  OMNIPAQUE  IOHEXOL  350 MG/ML SOLN COMPARISON:  12/14/2023 FINDINGS: CTA CHEST FINDINGS Cardiovascular: This is a technically adequate evaluation of the pulmonary vasculature. No filling defects or pulmonary emboli. The heart is unremarkable without pericardial effusion. Normal caliber of the thoracic aorta. Stable atherosclerosis of the coronary vasculature. Mediastinum/Nodes: No enlarged mediastinal, hilar, or axillary lymph nodes. Thyroid  gland, trachea, and esophagus demonstrate no significant findings. Stable postsurgical changes at the gastroesophageal junction. Lungs/Pleura: Stable emphysema. Chronic volume loss and consolidation again noted within the left upper and left lower lobe, consistent with areas of scarring and rounded atelectasis. No acute airspace disease, effusion, or pneumothorax. Central airways are patent. Musculoskeletal: Diffuse sclerotic bony metastases are again noted, not appreciably changed since the 12/14/2023 exam. No evidence of pathologic fracture. Review of the MIP images confirms the above findings. CT ABDOMEN and PELVIS FINDINGS Hepatobiliary: Stable hemangioma right lobe liver. Multiple scattered subcentimeter liver cysts are unchanged. Gallbladder is decompressed without evidence  of cholelithiasis or cholecystitis. No biliary duct dilation. Pancreas: Unremarkable. No pancreatic ductal dilatation or surrounding inflammatory changes. Spleen: Normal in size without focal abnormality. Adrenals/Urinary Tract: Stable 5 mm nonobstructing calculus lower pole left kidney. No obstructive uropathy within either kidney. The adrenals are stable. Bladder is unremarkable. Stomach/Bowel: No bowel obstruction or ileus. Normal appendix right lower quadrant. Scattered colonic diverticulosis without evidence of acute diverticulitis. No bowel wall thickening or inflammatory change. Vascular/Lymphatic: Stable atherosclerosis of the aorta and its branches. No pathologic adenopathy within the abdomen or pelvis. Reproductive: Fiduciary markers again noted within the prostate, which is not enlarged. Other: No free fluid or free intraperitoneal gas. No abdominal wall hernia. Musculoskeletal: Diffuse sclerotic bony metastases are again identified, without significant change since the prior exam. No evidence of pathologic fracture. Review of the MIP images confirms the above findings. IMPRESSION: 1. No evidence of pulmonary embolus. 2. Diffuse sclerotic bony metastases consistent with known history of metastatic prostate cancer. 3. Stable areas of scarring and rounded atelectasis within the left lung. No acute airspace disease. 4. Colonic diverticulosis without evidence of acute diverticulitis. 5.  Aortic Atherosclerosis (ICD10-I70.0). Electronically Signed   By: Ozell Daring M.D.   On: 01/04/2024 17:56   CT ABDOMEN PELVIS W CONTRAST Result Date: 01/04/2024 CLINICAL DATA:  Abdominal pain, cough, fever, history of metastatic prostate cancer EXAM: CT ANGIOGRAPHY CHEST CT ABDOMEN AND PELVIS WITH CONTRAST TECHNIQUE: Multidetector CT imaging of the chest was performed using the standard protocol during bolus administration of intravenous contrast. Multiplanar CT image reconstructions and MIPs were obtained to evaluate  the vascular anatomy. Multidetector CT imaging of the abdomen and pelvis was performed using the standard protocol during bolus administration of intravenous contrast. RADIATION DOSE REDUCTION: This exam was performed according to the departmental dose-optimization program which includes automated exposure control, adjustment of the mA and/or kV according to patient size and/or use of iterative reconstruction technique. CONTRAST:  OMNIPAQUE  IOHEXOL  350 MG/ML SOLN COMPARISON:  12/14/2023 FINDINGS: CTA CHEST FINDINGS Cardiovascular: This is a technically adequate evaluation of the pulmonary vasculature. No filling defects or pulmonary emboli. The heart is unremarkable without pericardial effusion. Normal caliber of  the thoracic aorta. Stable atherosclerosis of the coronary vasculature. Mediastinum/Nodes: No enlarged mediastinal, hilar, or axillary lymph nodes. Thyroid  gland, trachea, and esophagus demonstrate no significant findings. Stable postsurgical changes at the gastroesophageal junction. Lungs/Pleura: Stable emphysema. Chronic volume loss and consolidation again noted within the left upper and left lower lobe, consistent with areas of scarring and rounded atelectasis. No acute airspace disease, effusion, or pneumothorax. Central airways are patent. Musculoskeletal: Diffuse sclerotic bony metastases are again noted, not appreciably changed since the 12/14/2023 exam. No evidence of pathologic fracture. Review of the MIP images confirms the above findings. CT ABDOMEN and PELVIS FINDINGS Hepatobiliary: Stable hemangioma right lobe liver. Multiple scattered subcentimeter liver cysts are unchanged. Gallbladder is decompressed without evidence of cholelithiasis or cholecystitis. No biliary duct dilation. Pancreas: Unremarkable. No pancreatic ductal dilatation or surrounding inflammatory changes. Spleen: Normal in size without focal abnormality. Adrenals/Urinary Tract: Stable 5 mm nonobstructing calculus lower  pole left kidney. No obstructive uropathy within either kidney. The adrenals are stable. Bladder is unremarkable. Stomach/Bowel: No bowel obstruction or ileus. Normal appendix right lower quadrant. Scattered colonic diverticulosis without evidence of acute diverticulitis. No bowel wall thickening or inflammatory change. Vascular/Lymphatic: Stable atherosclerosis of the aorta and its branches. No pathologic adenopathy within the abdomen or pelvis. Reproductive: Fiduciary markers again noted within the prostate, which is not enlarged. Other: No free fluid or free intraperitoneal gas. No abdominal wall hernia. Musculoskeletal: Diffuse sclerotic bony metastases are again identified, without significant change since the prior exam. No evidence of pathologic fracture. Review of the MIP images confirms the above findings. IMPRESSION: 1. No evidence of pulmonary embolus. 2. Diffuse sclerotic bony metastases consistent with known history of metastatic prostate cancer. 3. Stable areas of scarring and rounded atelectasis within the left lung. No acute airspace disease. 4. Colonic diverticulosis without evidence of acute diverticulitis. 5.  Aortic Atherosclerosis (ICD10-I70.0). Electronically Signed   By: Ozell Daring M.D.   On: 01/04/2024 17:56   DG Chest Port 1 View Result Date: 01/04/2024 CLINICAL DATA:  Cough, fever. EXAM: PORTABLE CHEST 1 VIEW COMPARISON:  December 14, 2023. FINDINGS: The heart size and mediastinal contours are within normal limits. Old right rib fractures are noted. Sclerotic densities are noted throughout visualized skeleton suggesting metastatic disease. Stable left midlung opacity is noted most consistent with scarring or atelectasis. Right lung is unremarkable. IMPRESSION: Stable diffuse sclerotic densities consistent with osseous metastases. Stable left midlung opacity is noted consistent with scarring or atelectasis. Electronically Signed   By: Lynwood Landy Raddle M.D.   On: 01/04/2024 16:20         Scheduled Meds:  feeding supplement  237 mL Oral BID BM   ferrous sulfate   325 mg Oral QAC breakfast   nicotine   14 mg Transdermal Daily   oxyCODONE   30 mg Oral Q8H   pantoprazole   40 mg Oral Daily   phosphorus  250 mg Oral BID   senna  1 tablet Oral BID   Continuous Infusions:  vancomycin  166.7 mL/hr at 01/05/24 1905     LOS: 1 day    Time spent: 35 minutes    Toribio Hummer, MD [facility]   To contact the attending provider between 7A-7P or the covering provider during after hours 7P-7A, please log into the web site [email or web address] and access using universal [place] password for that web site. If you do not have the password, please call the hospital operator.  01/06/2024, 12:59 PM

## 2024-01-06 NOTE — Progress Notes (Signed)
 Daily Progress Note   Patient Name: Troy Mclaughlin       Date: 01/06/2024 DOB: 1956/03/30  Age: 68 y.o. MRN#: [id number] Attending Physician: Sebastian Toribio GAILS, MD Primary Care Physician: Oley Bascom RAMAN, NP Admit Date: 01/04/2024 Length of Stay: 1 day  Reason for Consultation/Follow-up: Establishing goals of care and symptom management  Subjective:   CC: Patient notes increased dose of oxycodone  does help with pain management.  Following up regarding complex medical decision making and symptom management.  Subjective:  Reviewed recent documentation from hospitalist.  At time of EMR review in past 24 hours patient has received as needed oxycodone  20 mg x 5 doses and as needed IV Dilaudid  1 mg x 2 doses.  Patient continues to receive OxyContin  15 mg every 12 hours during the day scheduled.  Presented to bedside to see patient.  RN present at bedside so able to receive medical updates. Discussed patient's care at this time.  Patient noted that he was able to meet with a hospice liaison yesterday.  Cannot tell me agency of hospice.  Patient noted that he had been told by the liaison they would come to bedside today to further discuss what care hospice would provide as he wanted family present for conversation as well.  Patient hoping his family including brother can discuss care with hospice liaison to obtain more information as well prior to making decisions about medical care moving forward.  Able to discuss symptom management at this time as well.  Patient feels that the increased dose of oxycodone  to 20 mg does help more with his pain management.  Discussed based on his as needed requirements, can increase his long-acting accordingly.  Discussed if patient would want to proceed with taking the medication twice a day or 3 times a day.  Patient noted he would want to take the medication 3 times a day to help with management more of his pain during the day when he is active more.  Noted would  increase his OxyContin  dose and schedule every 8 hours accordingly.  Will continue current as needed dosing of oxycodone .  Patient agreement with plan.  All questions answered at that time.  Noted palliative medicine team continue following patient's medical journey.  Review of Systems Cancer related pain Objective:   Vital Signs:  BP (!) 147/78 (BP Location: Right Arm)   Pulse 66   Temp 97.7 F (36.5 C)   Resp 18   Ht 5' 9 (1.753 m)   Wt 56.7 kg   SpO2 98%   BMI 18.46 kg/m   Physical Exam: General: NAD, alert, cachectic, frail Cardiovascular: RRR Respiratory: no increased work of breathing noted, not in respiratory distress Abdomen: not distended Neuro: A&Ox4, following commands easily Psych: appropriately answers all questions  Assessment & Plan:   Assessment: Patient is a 68 year old male with a past medical history of hypertension, anemia of chronic disease, hyperlipidemia, and 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  During hospitalization patient has received management for pain, nausea and vomiting, and electrolyte abnormalities.  Oncology consulted to provide recommendations.  Palliative medicine team consulted to assist with complex medical decision making. Of note patient follows with outpatient PMT at [facility].  Recommendations/Plan: # Complex medical decision making/goals of care:      - Discussed care planning with patient at bedside as detailed above in HPI.  Oncology has recommended patient  return home with hospice support due to progression of cancer on cancer directed therapies.  Patient noted he spoke with hospice liaison yesterday (unsure agency) and is hopeful to speak with them again today to get further information when his family, including his brother, can be involved in the conversation.  He wants further information before making  decisions moving forward.  Noted palliative medicine team to continue to engage in conversations as able and appropriate moving forward.                -  Code Status: Full Code   # Symptom management Patient is receiving these palliative interventions for symptom management with an intent to improve quality of life.                 - Pain, severe acute on chronic cancer pain in setting of metastatic prostate cancer At home receives Xtampza  13.5 mg every 12 hours and as needed oxycodone  10 mg every 4 hours as needed.  Xtampza  not on formulary.  Will need to be placed back on appropriate/adjusted dose of Xtampza  at time of discharge. Within the past 24 hours hours, patient has required as needed oxycodone  20 mg x 5 doses and as needed IV Dilaudid  1 mg x 2 doses for opioid management in addition to his long-acting medications. Based on OMEs calculated for this dose, will appropriately start patient on the listed regimen below.                                - Increase OxyContin  to 30mg  mg every 8 hours during the day                               - Continue oxycodone  to 20 mg every 4 hours as needed                               - Continue IV hydromorphone  to 1 mg every 4 hours as needed breakthrough pain after oral opioids                  - Constipation                With patient receiving opioids, needs scheduled bowel regimen.                                                       - Continue senna 1 tab twice daily                               - Change to MiraLAX  17 g daily   # Psycho-social/Spiritual Support:  - Support System:  patient reports he and wife, [name], are married   # Discharge Planning:  To Be Determined  Discussed with: Patient, RN  Thank you for allowing the palliative care team to participate in the care [name] [name] [name].  Tinnie Radar, DO Palliative Care Provider PMT # [phone]  If patient remains symptomatic despite maximum doses, please call PMT at [phone]  between 0700 and 1900. Outside of these hours, please call attending, as  PMT does not have night coverage.

## 2024-01-07 DIAGNOSIS — I10 Essential (primary) hypertension: Secondary | ICD-10-CM | POA: Diagnosis not present

## 2024-01-07 DIAGNOSIS — E876 Hypokalemia: Secondary | ICD-10-CM | POA: Diagnosis not present

## 2024-01-07 DIAGNOSIS — C61 Malignant neoplasm of prostate: Secondary | ICD-10-CM | POA: Diagnosis not present

## 2024-01-07 DIAGNOSIS — R4589 Other symptoms and signs involving emotional state: Secondary | ICD-10-CM | POA: Diagnosis not present

## 2024-01-07 DIAGNOSIS — Z515 Encounter for palliative care: Secondary | ICD-10-CM | POA: Diagnosis not present

## 2024-01-07 DIAGNOSIS — Z79899 Other long term (current) drug therapy: Secondary | ICD-10-CM | POA: Diagnosis not present

## 2024-01-07 DIAGNOSIS — R112 Nausea with vomiting, unspecified: Secondary | ICD-10-CM | POA: Diagnosis not present

## 2024-01-07 DIAGNOSIS — G893 Neoplasm related pain (acute) (chronic): Secondary | ICD-10-CM | POA: Diagnosis not present

## 2024-01-07 LAB — RENAL FUNCTION PANEL
Albumin: 2.5 g/dL — ABNORMAL LOW (ref 3.5–5.0)
Anion gap: 5 (ref 5–15)
BUN: <5 mg/dL — ABNORMAL LOW (ref 8–23)
CO2: 27 mmol/L (ref 22–32)
Calcium: 8.3 mg/dL — ABNORMAL LOW (ref 8.9–10.3)
Chloride: 97 mmol/L — ABNORMAL LOW (ref 98–111)
Creatinine, Ser: 0.68 mg/dL (ref 0.61–1.24)
GFR, Estimated: >60 mL/min (ref >60)
Glucose, Bld: 91 mg/dL (ref 70–99)
Phosphorus: 2.5 mg/dL (ref 2.5–4.6)
Potassium: 4.3 mmol/L (ref 3.5–5.1)
Sodium: 129 mmol/L — ABNORMAL LOW (ref 135–145)

## 2024-01-07 LAB — CBC
HCT: 25.1 % — ABNORMAL LOW (ref 39.0–52.0)
Hemoglobin: 8 g/dL — ABNORMAL LOW (ref 13.0–17.0)
MCH: 22.2 pg — ABNORMAL LOW (ref 26.0–34.0)
MCHC: 31.9 g/dL (ref 30.0–36.0)
MCV: 69.5 fL — ABNORMAL LOW (ref 80.0–100.0)
Platelets: 278 K/uL (ref 150–400)
RBC: 3.61 MIL/uL — ABNORMAL LOW (ref 4.22–5.81)
RDW: 17.4 % — ABNORMAL HIGH (ref 11.5–15.5)
WBC: 3.6 K/uL — ABNORMAL LOW (ref 4.0–10.5)
nRBC: 0 % (ref 0.0–0.2)

## 2024-01-07 LAB — MAGNESIUM: Magnesium: 1.7 mg/dL (ref 1.7–2.4)

## 2024-01-07 MED ORDER — LACTATED RINGERS IV SOLN: INTRAVENOUS | Status: DC

## 2024-01-07 MED ORDER — ALUM & MAG HYDROXIDE-SIMETH 200-200-20 MG/5ML PO SUSP
30.0000 mL | ORAL | Status: DC | PRN
  Administered 2024-01-07 – 2024-01-08 (×5): 30 mL via ORAL
  Filled 2024-01-07 (×5): qty 30

## 2024-01-07 MED ORDER — MAGNESIUM SULFATE 4 GM/100ML IV SOLN
4.0000 g | Freq: Once | INTRAVENOUS | Status: AC
  Administered 2024-01-07: 4 g via INTRAVENOUS
  Filled 2024-01-07: qty 100

## 2024-01-07 NOTE — Progress Notes (Signed)
 Daily Progress Note   Patient Name: Troy Mclaughlin       Date: 01/07/2024 DOB: Nov 14, 1955  Age: 68 y.o. MRN#: [id number] Attending Physician: Sebastian Toribio GAILS, MD Primary Care Physician: Oley Bascom RAMAN, NP Admit Date: 01/04/2024 Length of Stay: 2 days  Reason for Consultation/Follow-up: Establishing goals of care and symptom management  Subjective:   CC: Patient notes medications are helping with pain management.  Following up regarding complex medical decision making and symptom management.  Subjective:  Reviewed recent documentation from hospitalist.  At time of EMR review patient has received as needed oxycodone  20 mg x 4 doses and as needed IV Dilaudid  1 mg x 2 doses.  Patient was started on OxyContin  30 mg every 8 hours during the day on 01/06/2024, appears one dose was held though unable to find documentation why occurred.  Presented to bedside to see patient.  No visitors present at bedside.  Discussed with patient pain management at this time.  Patient does feel that the increased doses of opioids are assisting with pain management.  Noted will continue on current doses at this time.  Inquired about care planning moving forward.  Patient attempted to get his brother on the phone to be involved in conversation though could not reach.  Again inquired about discussion patient had had about hospice and after further discussion was able to determine that patient had only spoken to [facility], did not speak directly with the hospice liaison.  Noted patient would have to choose a hospice group in order to speak to a liaison.  Encouraged patient to do so so he could get the information he desires about what hospice would and would not entail. Also inquired again about CODE STATUS.  Patient noted that he had not considered it further.  Again expressed concern that with patient's underlying metastatic cancer that is continuing to progress, if he were sick and if his heart were to stop or he would stop  breathing interventions such as cardiac resuscitation and intubation with mechanical ventilation would not lead to quality of life outcomes.  Patient acknowledges this and noted he would track to consider this further.  Spent time providing emotional support via active listening.  Noted palliative medicine team continue to follow with patient's medical journey.  Review of Systems Cancer related pain Objective:   Vital Signs:  BP 125/70 (BP Location: Right Arm)   Pulse 61   Temp 98.1 F (36.7 C) (Oral)   Resp 16   Ht 5' 9 (1.753 m)   Wt 56.7 kg   SpO2 99%   BMI 18.46 kg/m   Physical Exam: General: NAD, alert, cachectic, frail Cardiovascular: RRR Respiratory: no increased work of breathing noted, not in respiratory distress Abdomen: not distended Neuro: A&Ox4, following commands easily Psych: appropriately answers all questions  Assessment & Plan:   Assessment: Patient is a 68 year old male with a past medical history of hypertension, anemia of chronic disease, hyperlipidemia, and 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  During hospitalization patient has received management for pain, nausea and vomiting, and electrolyte abnormalities.  Oncology consulted to provide recommendations.  Palliative medicine team consulted to assist with complex medical decision making. Of note patient follows with outpatient PMT at [facility].  Recommendations/Plan: # Complex medical decision making/goals of care:      - Discussed care planning with patient at bedside as detailed above in  HPI.  Oncology has already recommended patient return home with hospice support due to progression of cancer on cancer directed therapies.  Upon further discussion was determined patient did not speak to hospice liaison, spoke to [facility].  Expressed need to choose hospice group in order to speak to  a hospice liaison.  Since patient wants further information before making decisions moving forward, noted this would be next step.  Noted palliative medicine team to continue to engage in conversations as able and appropriate moving forward.                -  Code Status: Full Code    - Again attempted to discuss CODE STATUS with patient on 01/07/2024.  Again expressed concern that with patient's underlying metastatic cancer, if he were sick and of his heart were to stop or he would stop breathing, intervention such as cardiac resuscitation and intubation with mechanical ventilation would not lead to quality of life outcomes.  Have again encouraged patient to consider this.  At this time remains full code.  # Symptom management Patient is receiving these palliative interventions for symptom management with an intent to improve quality of life.                 - Pain, severe acute on chronic cancer pain in setting of metastatic prostate cancer At home receives Xtampza  13.5 mg every 12 hours and as needed oxycodone  10 mg every 4 hours as needed.  Xtampza  not on formulary.  Will need to be placed back on appropriate/adjusted dose of Xtampza  at time of discharge.                               -Continue OxyContin  to 30mg  mg every 8 hours during the day                               - Continue oxycodone  to 20 mg every 4 hours as needed                               - Continue IV hydromorphone  to 1 mg every 4 hours as needed breakthrough pain after oral opioids                  - Constipation                With patient receiving opioids, needs scheduled bowel regimen.                                                       - Continue senna 1 tab twice daily                               - Change to MiraLAX  17 g daily   # Psycho-social/Spiritual Support:  - Support System:  patient reports he and wife, [name], are married   # Discharge Planning:  To Be Determined  Discussed with: Patient, RN - Patient  attempted to call brother to engage in conversation while this provider at bedside though was unable to reach him.  Thank you for  allowing the palliative care team to participate in the care [name] JINNY Poag.  Tinnie Radar, DO Palliative Care Provider PMT # [phone]  If patient remains symptomatic despite maximum doses, please call PMT at [phone] between 0700 and 1900. Outside of these hours, please call attending, as PMT does not have night coverage.  Personally spent 40 minutes in patient care including extensive chart review (labs, imaging, progress/consult notes, vital signs), medically appropraite exam, discussed with treatment team, education to patient, family, and staff, documenting clinical information, medication review and management, coordination of care, and available advanced directive documents.

## 2024-01-07 NOTE — Plan of Care (Signed)

## 2024-01-07 NOTE — Progress Notes (Signed)
 PROGRESS NOTE    Troy Mclaughlin  FMW:[id number] DOB: [birth year] DOA: 01/04/2024 PCP: Oley Bascom RAMAN, NP    Chief Complaint  Patient presents with   Emesis   Weakness    Brief Narrative:  Troy Mclaughlin is a 68 y.o. 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    Assessment & Plan:   Principal Problem:   Cancer associated pain Active Problems:   Hypokalemia   Essential hypertension   Nausea and vomiting   Malignant neoplasm of prostate (HCC)   Hypomagnesemia   Constipation   Need for emotional support   DNR (do not resuscitate) discussion   ACP (advance care planning)   Counseling and coordination of care   Prostate cancer [facility])   Palliative care encounter   High risk medication use   Medication management  #1 uncontrolled cancer associated pain -Patient presented with pain on relief on home regimen. - CT angiogram chest, CT abdomen and pelvis with diffuse sclerotic bony metastases noted. - OxyContin  increased to 30 mg every 8 hours and oxycodone  increased to 20 mg every 4 hours as needed breakthrough pain per palliative care. - Continue IV Dilaudid  1 mg every 4 hours as needed for severe breakthrough pain. - Patient placed back on bowel regimen of MiraLAX  17 g daily, senna 1 tab twice daily, per palliative care.  - Palliative care following and I appreciate your input and recommendations.   2.  Intractable nausea and vomiting -??Etiology -Tolerating current diet. - CT abdomen and pelvis negative for bowel obstruction or ileus. -Patient with clinical improvement. - Continue IV antiemetics, IV fluids, supportive care.  3.  Hypokalemia/hypomagnesemia/hypophosphatemia -Secondary to GI losses. - Potassium at 4.3 today, magnesium  at 1.7. - Continue K-Phos 250 mg  twice daily x 3 days. -Magnesium  sulfate 4 g IV x 1. - Repeat labs in the AM.  4.  Stage IV prostate cancer with diffuse osseous metastases -Being followed by Dr. Tina with oncology. - Patient noted to have been started on Pluvicto , first treatment 12/21/2023. - Patient being followed by oncology who are recommending consultation with hospice and arrangement for home hospice prior to discharge. - Palliative care consulted.  5.  Leukopenia/lymphocytopenia -Likely related to Pluvicto . - Follow.  6.  Hypertension -BP noted to be controlled earlier on but started to trend up.   - Improved with resumption of home regimen Coreg .   - Continue to hold Norvasc  and may resume in the next 24 hours if BP remains elevated.    7.  Anemia of chronic disease -Patient noted to have reported to admitting physician seeing dark black stools recently. - FOBT pending. - Prior FOBT 11/30/2022 noted to be negative. - Hemoglobin currently stable at 8.0. - Follow H&H. - Transfusion threshold hemoglobin < 8.  8.  Dizziness/orthostasis - Clinical improvement with hydration - Continue IV fluids for another 24 hours..    9.??  Bacteremia versus contaminant - Preliminary blood cultures with 1/2 with gram-positive cocci with staph species with sensitivities pending. - Due to patient's immunocompromise status patient started on IV vancomycin  pending finalization of culture results.     DVT prophylaxis: SCDs Code Status: Full Family Communication: Updated patient.  No family at bedside. Disposition: Likely home when clinically improved, pain control, tolerating oral intake.  Status is: Inpatient    Consultants:  Hematology/oncology: Dr.  Chang 01/04/2024 Palliative care  Procedures: CT angiogram chest 01/04/2024 CT abdomen and pelvis 01/04/2024 Chest x-ray 01/04/2024   Antimicrobials:  Anti-infectives (From admission, onward)    Start     Dose/Rate Route Frequency Ordered Stop   01/05/24 1800   vancomycin  (VANCOREADY) IVPB 1250 mg/250 mL        1,250 mg 166.7 mL/hr over 90 Minutes Intravenous Every 24 hours 01/05/24 1746           Subjective: Patient sitting up at the side of the bed watching television.  Still with complaints of rib pain however feels adjustment in pain medication is helping with his pain but has not fully taken away the pain.  Denies any shortness of breath.  States improvement with dizziness.  No nausea or vomiting.  Some complaints of weakness.  Tolerating current diet.    Objective: Vitals:   01/06/24 1805 01/06/24 1925 01/06/24 2003 01/07/24 0525  BP: (!) 184/92 (!) 175/104 134/79 125/70  Pulse: 82 83 61 61  Resp:   16 16  Temp:   97.9 F (36.6 C) 98.1 F (36.7 C)  TempSrc:   Oral Oral  SpO2:   100% 99%  Weight:      Height:        Intake/Output Summary (Last 24 hours) at 01/07/2024 1045 Last data filed at 01/07/2024 9390 Gross per 24 hour  Intake 2665.33 ml  Output 1850 ml  Net 815.33 ml   Filed Weights   01/04/24 1456  Weight: 56.7 kg    Examination:  General exam: NAD.  Frail. Respiratory system: Lungs clear to auscultation bilaterally.  No wheezes, no crackles, no rhonchi.  Fair air movement.  Speaking in full sentences.  Cardiovascular system: Regular rate rhythm no murmurs rubs or gallops.  No JVD.  No pitting lower extremity edema.  Gastrointestinal system: Abdomen soft, nontender, nondistended, positive bowel sounds.  No rebound.  No guarding.  Central nervous system: Alert and oriented. Moving extremities spontaneously.  No focal neurological deficits. Extremities: Symmetric 5 x 5 power. Skin: No rashes, lesions or ulcers Psychiatry: Judgement and insight appear normal. Mood & affect appropriate.     Data Reviewed: I have personally reviewed following labs and imaging studies  CBC: Recent Labs  Lab 01/04/24 1347 01/04/24 1618 01/05/24 0512 01/06/24 0601 01/07/24 0647  WBC 2.8*  --  2.8* 3.6* 3.6*  NEUTROABS  --  2.1   --  2.7  --   HGB 8.7*  --  8.0* 8.0* 8.0*  HCT 27.6*  --  24.8* 25.4* 25.1*  MCV 69.5*  --  70.5* 69.8* 69.5*  PLT 347  --  290 295 278    Basic Metabolic Panel: Recent Labs  Lab 01/04/24 1347 01/04/24 1618 01/05/24 0512 01/06/24 0601 01/07/24 0647  NA 132*  --  131* 132* 129*  K 2.7*  --  3.4* 4.0 4.3  CL 97*  --  100 102 97*  CO2 24  --  24 23 27   GLUCOSE 96  --  91 90 91  BUN 11  --  8 <5* <5*  CREATININE 0.86  --  0.72 0.62 0.68  CALCIUM  8.5*  --  8.5* 8.4* 8.3*  MG  --  1.6* 1.8 1.7 1.7  PHOS  --   --   --  2.4* 2.5    GFR: Estimated Creatinine Clearance: 72.8 mL/min (by C-G formula based on SCr of 0.68 mg/dL).  Liver Function Tests: Recent Labs  Lab 01/04/24 1347 01/06/24 0601  01/07/24 0647  AST 13*  --   --   ALT 8  --   --   ALKPHOS 540*  --   --   BILITOT 0.5  --   --   PROT 7.3  --   --   ALBUMIN 3.3* 2.6* 2.5*    CBG: No results for input(s): GLUCAP in the last 168 hours.   Recent Results (from the past 240 hours)  Culture, blood (routine x 2)     Status: None (Preliminary result)   Collection Time: 01/04/24  4:18 PM   Specimen: BLOOD  Result Value Ref Range Status   Specimen Description   Final    BLOOD LEFT ANTECUBITAL Performed at [facility], [address] [place], [place] [address]    Special Requests   Final    BOTTLES DRAWN AEROBIC AND ANAEROBIC Blood Culture adequate volume Performed at [facility], [address], [place], [place] [address]    Culture  Setup Time   Final    GRAM POSITIVE COCCI IN CLUSTERS AEROBIC BOTTLE ONLY CRITICAL RESULT CALLED TO, READ BACK BY AND VERIFIED WITH: PHARMD A. PHAM [id number] @ 1728 FH    Culture   Final    GRAM POSITIVE COCCI CULTURE REINCUBATED FOR BETTER GROWTH Performed at [facility], [address] [place], [place] [address]    Report Status PENDING  Incomplete  Blood Culture ID Panel (Reflexed)     Status: Abnormal   Collection Time:  01/04/24  4:18 PM  Result Value Ref Range Status   Enterococcus faecalis NOT DETECTED NOT DETECTED Final   Enterococcus Faecium NOT DETECTED NOT DETECTED Final   Listeria monocytogenes NOT DETECTED NOT DETECTED Final   Staphylococcus species DETECTED (A) NOT DETECTED Final    Comment: CRITICAL RESULT CALLED TO, READ BACK BY AND VERIFIED WITH: PHARMD A. PHAM [id number] @ 1728 FH    Staphylococcus aureus (BCID) NOT DETECTED NOT DETECTED Final   Staphylococcus epidermidis NOT DETECTED NOT DETECTED Final   Staphylococcus lugdunensis NOT DETECTED NOT DETECTED Final   Streptococcus species NOT DETECTED NOT DETECTED Final   Streptococcus agalactiae NOT DETECTED NOT DETECTED Final   Streptococcus pneumoniae NOT DETECTED NOT DETECTED Final   Streptococcus pyogenes NOT DETECTED NOT DETECTED Final   A.calcoaceticus-baumannii NOT DETECTED NOT DETECTED Final   Bacteroides fragilis NOT DETECTED NOT DETECTED Final   Enterobacterales NOT DETECTED NOT DETECTED Final   Enterobacter cloacae complex NOT DETECTED NOT DETECTED Final   Escherichia coli NOT DETECTED NOT DETECTED Final   Klebsiella aerogenes NOT DETECTED NOT DETECTED Final   Klebsiella oxytoca NOT DETECTED NOT DETECTED Final   Klebsiella pneumoniae NOT DETECTED NOT DETECTED Final   Proteus species NOT DETECTED NOT DETECTED Final   Salmonella species NOT DETECTED NOT DETECTED Final   Serratia marcescens NOT DETECTED NOT DETECTED Final   Haemophilus influenzae NOT DETECTED NOT DETECTED Final   Neisseria meningitidis NOT DETECTED NOT DETECTED Final   Pseudomonas aeruginosa NOT DETECTED NOT DETECTED Final   Stenotrophomonas maltophilia NOT DETECTED NOT DETECTED Final   Candida albicans NOT DETECTED NOT DETECTED Final   Candida auris NOT DETECTED NOT DETECTED Final   Candida glabrata NOT DETECTED NOT DETECTED Final   Candida krusei NOT DETECTED NOT DETECTED Final   Candida parapsilosis NOT DETECTED NOT DETECTED Final   Candida tropicalis NOT  DETECTED NOT DETECTED Final   Cryptococcus neoformans/gattii NOT DETECTED NOT DETECTED Final    Comment: Performed at [facility],  [address], [place], [place] [address]  Resp panel by RT-PCR (RSV, Flu A&B, Covid) Anterior Nasal Swab     Status: None   Collection Time: 01/04/24  4:31 PM   Specimen: Anterior Nasal Swab  Result Value Ref Range Status   SARS Coronavirus 2 by RT PCR NEGATIVE NEGATIVE Final    Comment: (NOTE) SARS-CoV-2 target nucleic acids are NOT DETECTED.  The SARS-CoV-2 RNA is generally detectable in upper respiratory specimens during the acute phase of infection. The lowest concentration of SARS-CoV-2 viral copies this assay can detect is 138 copies/mL. A negative result does not preclude SARS-Cov-2 infection and should not be used as the sole basis for treatment or other patient management decisions. A negative result may occur with  improper specimen collection/handling, submission of specimen other than nasopharyngeal swab, presence of viral mutation(s) within the areas targeted by this assay, and inadequate number of viral copies(<138 copies/mL). A negative result must be combined with clinical observations, patient history, and epidemiological information. The expected result is Negative.  Fact Sheet for Patients:  BloggerCourse.com  Fact Sheet for Healthcare Providers:  SeriousBroker.it  This test is no t yet approved or cleared by the [place]  FDA and  has been authorized for detection and/or diagnosis of SARS-CoV-2 by FDA under an Emergency Use Authorization (EUA). This EUA will remain  in effect (meaning this test can be used) for the duration of the COVID-19 declaration under Section 564(b)(1) of the Act, 21 U.S.C.section 360bbb-3(b)(1), unless the authorization is terminated  or revoked sooner.       Influenza A by PCR NEGATIVE NEGATIVE Final   Influenza B by PCR NEGATIVE NEGATIVE  Final    Comment: (NOTE) The Xpert Xpress SARS-CoV-2/FLU/RSV plus assay is intended as an aid in the diagnosis of influenza from Nasopharyngeal swab specimens and should not be used as a sole basis for treatment. Nasal washings and aspirates are unacceptable for Xpert Xpress SARS-CoV-2/FLU/RSV testing.  Fact Sheet for Patients: BloggerCourse.com  Fact Sheet for Healthcare Providers: SeriousBroker.it  This test is not yet approved or cleared by the [place]  FDA and has been authorized for detection and/or diagnosis of SARS-CoV-2 by FDA under an Emergency Use Authorization (EUA). This EUA will remain in effect (meaning this test can be used) for the duration of the COVID-19 declaration under Section 564(b)(1) of the Act, 21 U.S.C. section 360bbb-3(b)(1), unless the authorization is terminated or revoked.     Resp Syncytial Virus by PCR NEGATIVE NEGATIVE Final    Comment: (NOTE) Fact Sheet for Patients: BloggerCourse.com  Fact Sheet for Healthcare Providers: SeriousBroker.it  This test is not yet approved or cleared by the [place]  FDA and has been authorized for detection and/or diagnosis of SARS-CoV-2 by FDA under an Emergency Use Authorization (EUA). This EUA will remain in effect (meaning this test can be used) for the duration of the COVID-19 declaration under Section 564(b)(1) of the Act, 21 U.S.C. section 360bbb-3(b)(1), unless the authorization is terminated or revoked.  Performed at [facility], [address], [place], [place] [address]   Culture, blood (routine x 2)     Status: None (Preliminary result)   Collection Time: 01/04/24 10:19 PM   Specimen: BLOOD RIGHT HAND  Result Value Ref Range Status   Specimen Description BLOOD RIGHT HAND  Final   Special Requests   Final    BOTTLES DRAWN AEROBIC ONLY Blood Culture results may not be  optimal due to an inadequate volume of blood received in  culture bottles   Culture   Final    NO GROWTH 2 DAYS Performed at [facility], [address], [place], [place] [address]    Report Status PENDING  Incomplete         Radiology Studies: No results found.       Scheduled Meds:  carvedilol   12.5 mg Oral BID WC   feeding supplement  237 mL Oral BID BM   ferrous sulfate   325 mg Oral QAC breakfast   nicotine   14 mg Transdermal Daily   oxyCODONE   30 mg Oral Q8H   pantoprazole   40 mg Oral BID AC   phosphorus  250 mg Oral BID   polyethylene glycol  17 g Oral Daily   senna  1 tablet Oral BID   Continuous Infusions:  lactated ringers  125 mL/hr (01/07/24 0626)   magnesium  sulfate bolus IVPB 4 g (01/07/24 0916)   vancomycin  1,250 mg (01/06/24 1804)     LOS: 2 days    Time spent: 35 minutes    Toribio Hummer, MD [facility]   To contact the attending provider between 7A-7P or the covering provider during after hours 7P-7A, please log into the web site [email or web address] and access using universal [place] password for that web site. If you do not have the password, please call the hospital operator.  01/07/2024, 10:45 AM

## 2024-01-08 ENCOUNTER — Inpatient Hospital Stay

## 2024-01-08 ENCOUNTER — Inpatient Hospital Stay: Admitting: Nurse Practitioner

## 2024-01-08 DIAGNOSIS — R112 Nausea with vomiting, unspecified: Secondary | ICD-10-CM | POA: Diagnosis not present

## 2024-01-08 DIAGNOSIS — Z7189 Other specified counseling: Secondary | ICD-10-CM | POA: Diagnosis not present

## 2024-01-08 DIAGNOSIS — G893 Neoplasm related pain (acute) (chronic): Secondary | ICD-10-CM | POA: Diagnosis not present

## 2024-01-08 DIAGNOSIS — E876 Hypokalemia: Secondary | ICD-10-CM | POA: Diagnosis not present

## 2024-01-08 DIAGNOSIS — C61 Malignant neoplasm of prostate: Secondary | ICD-10-CM | POA: Diagnosis not present

## 2024-01-08 DIAGNOSIS — I10 Essential (primary) hypertension: Secondary | ICD-10-CM | POA: Diagnosis not present

## 2024-01-08 LAB — CBC
HCT: 27.3 % — ABNORMAL LOW (ref 39.0–52.0)
Hemoglobin: 8.7 g/dL — ABNORMAL LOW (ref 13.0–17.0)
MCH: 22.1 pg — ABNORMAL LOW (ref 26.0–34.0)
MCHC: 31.9 g/dL (ref 30.0–36.0)
MCV: 69.3 fL — ABNORMAL LOW (ref 80.0–100.0)
Platelets: 273 K/uL (ref 150–400)
RBC: 3.94 MIL/uL — ABNORMAL LOW (ref 4.22–5.81)
RDW: 17.6 % — ABNORMAL HIGH (ref 11.5–15.5)
WBC: 6.1 K/uL (ref 4.0–10.5)
nRBC: 0 % (ref 0.0–0.2)

## 2024-01-08 LAB — RENAL FUNCTION PANEL
Albumin: 2.8 g/dL — ABNORMAL LOW (ref 3.5–5.0)
Anion gap: 9 (ref 5–15)
BUN: 9 mg/dL (ref 8–23)
CO2: 25 mmol/L (ref 22–32)
Calcium: 8.6 mg/dL — ABNORMAL LOW (ref 8.9–10.3)
Chloride: 97 mmol/L — ABNORMAL LOW (ref 98–111)
Creatinine, Ser: 0.85 mg/dL (ref 0.61–1.24)
GFR, Estimated: >60 mL/min (ref >60)
Glucose, Bld: 105 mg/dL — ABNORMAL HIGH (ref 70–99)
Phosphorus: 2.6 mg/dL (ref 2.5–4.6)
Potassium: 4.5 mmol/L (ref 3.5–5.1)
Sodium: 131 mmol/L — ABNORMAL LOW (ref 135–145)

## 2024-01-08 LAB — CULTURE, BLOOD (ROUTINE X 2): Special Requests: ADEQUATE

## 2024-01-08 LAB — MAGNESIUM: Magnesium: 2.2 mg/dL (ref 1.7–2.4)

## 2024-01-08 NOTE — Assessment & Plan Note (Signed)
 Progression on multiple treatment. Recommend home hospice. Patient expresses will speak to hospice today  Please arrange for home hospice before discharge
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
## 2024-01-08 NOTE — Progress Notes (Signed)
 Troy Mclaughlin 1605 - [facility] Liaison Note             Received referral for hospice services at home after discharge. Met with patient and wife to discuss hospice philosophy and services.   Per patient request will follow up tomorrow after he has time to discuss details of discharge with his wife and brother.   Transition of Care manager updated.    Please reach out if there are questions or concerns.   [name], BSN, RN, [facility]  [phone]

## 2024-01-08 NOTE — Progress Notes (Signed)
 Daily Progress Note   Patient Name: Troy Mclaughlin       Date: 01/08/2024 DOB: [birth year]  Age: 68 y.o. MRN#: [id number] Attending Physician: Sebastian Toribio GAILS, MD Primary Care Physician: Oley Bascom RAMAN, NP Admit Date: 01/04/2024 Length of Stay: 3 days  Reason for Consultation/Follow-up: Establishing goals of care and symptom management  Subjective:   CC: Patient is resting in bed, he recalls his conversations with Dr Tina earlier this am.   Following up regarding complex medical decision making and symptom management.  Subjective:  Reviewed recent documentation from hospitalist.    EMR reviewed   Presented to bedside to see patient.  No visitors present at bedside.  Discussed with patient pain management at this time.    Patient recalls having CODE STATUS as well as hospice discussions with his oncologist earlier today.  While patient realizes the need for hospice support he states that he is not ready to commit to changing CODE STATUS to DNR just yet.  He asks for some time and space for reflection, states that he will give an answer by this afternoon or tomorrow morning after he has more discussions with family members.   With his permission, I gently attempted to elaborate on the importance of establishing DNR/DNI, explained frankly but compassionately about the harm distress and discomfort that would come about from the full scope of a resuscitative attempt.  Patient becomes visibly restless and distressed and states that he will make a decision soon. Spent time providing emotional support via active listening.  Noted palliative medicine team continue to follow with patient's medical journey.  Review of Systems Cancer related pain Objective:   Vital Signs:  BP 134/77   Pulse 62   Temp 97.7 F (36.5 C) (Oral)   Resp 16   Ht 5' 9 (1.753 m)   Wt 56.7 kg   SpO2 100%   BMI 18.46 kg/m   Physical Exam: General: NAD, alert, cachectic, frail Cardiovascular:  RRR Respiratory: no increased work of breathing noted, not in respiratory distress Abdomen: not distended Neuro: A&Ox4, following commands easily Psych: appropriately answers all questions  Assessment & Plan:   Assessment: Patient is a 68 year old male with a past medical history of hypertension, anemia of chronic disease, hyperlipidemia, and 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  During hospitalization patient has received management for pain, nausea and vomiting, and electrolyte abnormalities.  Oncology consulted to provide recommendations.  Palliative medicine team consulted to assist with complex medical decision making. Of note patient follows with outpatient PMT at [facility].  Recommendations/Plan: # Complex medical decision making/goals of care:      - Discussed care planning with patient at bedside as detailed above in HPI.  Oncology has already recommended patient return home with hospice support due to progression of cancer on cancer directed therapies.     Noted palliative medicine team to continue to engage in conversations as able and appropriate moving forward.                -  Code Status: Full Code    - Again attempted to discuss CODE STATUS with patient on 01/08/2024.  Again expressed concern that with patient's underlying metastatic cancer, if he were sick and of his heart were to stop or he would stop breathing, intervention such as cardiac resuscitation and intubation with mechanical ventilation would not lead to quality of  life outcomes.  Have again encouraged patient to consider this.  At this time remains full code.  He asks for more time for this consideration.  Palliative will follow morning-19-25.  # Symptom management Patient is receiving these palliative interventions for symptom management with an intent to improve quality of life.                  - Pain, severe acute on chronic cancer pain in setting of metastatic prostate cancer At home receives Xtampza  13.5 mg every 12 hours and as needed oxycodone  10 mg every 4 hours as needed.  Xtampza  not on formulary.  Will need to be placed back on appropriate/adjusted dose of Xtampza  at time of discharge.                               -Continue OxyContin  to 30mg  mg every 8 hours during the day                               - Continue oxycodone  to 20 mg every 4 hours as needed                               - Continue IV hydromorphone  to 1 mg every 4 hours as needed breakthrough pain after oral opioids                  - Constipation                With patient receiving opioids, needs scheduled bowel regimen.                                                       - Continue senna 1 tab twice daily                               - Change to MiraLAX  17 g daily   # Psycho-social/Spiritual Support:  - Support System:  patient reports he and wife, [name], are married   # Discharge Planning: Home with hospice is being considered, TOC note reviewed, oncology note reviewed. Discussed with: Patient, TOC, oncology.  - Patient attempted to call brother to engage in conversation while this provider at bedside though was unable to reach him.  Thank you for allowing the palliative care team to participate in the care [name] JINNY Poag.  Mod MDM Lonia Serve MD Palliative Care Provider PMT # [phone]  If patient remains symptomatic despite maximum doses, please call PMT at [phone] between 0700 and 1900. Outside of these hours, please call attending, as PMT does not have night coverage.

## 2024-01-08 NOTE — Assessment & Plan Note (Signed)
 Replaced

## 2024-01-08 NOTE — Assessment & Plan Note (Signed)
Discussed as above.

## 2024-01-08 NOTE — Progress Notes (Signed)
 PROGRESS NOTE    Troy Mclaughlin  FMW:[id number] DOB: 1956/04/21 DOA: 01/04/2024 PCP: Oley Bascom RAMAN, NP    Chief Complaint  Patient presents with   Emesis   Weakness    Brief Narrative:  Troy Mclaughlin is a 68 y.o. 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    Assessment & Plan:   Principal Problem:   Cancer associated pain Active Problems:   Hypokalemia   Essential hypertension   Nausea and vomiting   Malignant neoplasm of prostate (HCC)   Hypomagnesemia   Constipation   Need for emotional support   DNR (do not resuscitate) discussion   ACP (advance care planning)   Counseling and coordination of care   Prostate cancer [facility])   Palliative care encounter   High risk medication use   Medication management  #1 uncontrolled cancer associated pain -Patient presented with pain on relief on home regimen. - CT angiogram chest, CT abdomen and pelvis with diffuse sclerotic bony metastases noted. - OxyContin  increased to 30 mg every 8 hours and oxycodone  increased to 20 mg every 4 hours as needed breakthrough pain per palliative care. - Continue IV Dilaudid  1 mg every 4 hours as needed for severe breakthrough pain. - Patient placed back on bowel regimen of MiraLAX  17 g daily, senna 1 tab twice daily, per palliative care.  - Palliative care following and appreciate input and recommendations.   2.  Intractable nausea and vomiting -??Etiology -Tolerating current diet. - CT abdomen and pelvis negative for bowel obstruction or ileus. -Patient with clinical improvement. - Continue IV antiemetics, IV fluids, supportive care.  3.  Hypokalemia/hypomagnesemia/hypophosphatemia -Secondary to GI losses. - Potassium at 4.5 today, magnesium  at 2.2. -Phosphorus at 2.6. - Continue  K-Phos 250 mg twice daily x 3 days total. - Repeat labs in the AM.  4.  Stage IV prostate cancer with diffuse osseous metastases -Being followed by Dr. Tina with oncology. - Patient noted to have been started on Pluvicto , first treatment 12/21/2023. - Patient being followed by oncology who are recommending consultation with hospice and arrangement for home hospice prior to discharge. - Palliative care consulted and are following. - Symptom management per palliative care.  5.  Leukopenia/lymphocytopenia -Likely related to Pluvicto . - Follow.  6.  Hypertension -BP noted to be controlled earlier on but started to trend up.   - Improved with resumption of home regimen Coreg .   - Norvasc  on hold.   7.  Anemia of chronic disease -Patient noted to have reported to admitting physician seeing dark black stools recently. - FOBT pending. - Prior FOBT 11/30/2022 noted to be negative. - Hemoglobin stable at 8.7.  - Follow H&H. - Transfusion threshold hemoglobin < 8.  8.  Dizziness/orthostasis - Improved with hydration.   9.??  Bacteremia versus contaminant - Preliminary blood cultures with 1/2 with gram-positive cocci with staph hominis.  Likely contaminant. - Due to patient's immunocompromise status patient started on IV vancomycin  and has received 3 days and as such we will discontinue IV vancomycin . - No further antibiotics needed.     DVT prophylaxis: SCDs Code Status: Full Family Communication: Updated patient.  No family at bedside. Disposition: Likely home when clinically improved, pain control, tolerating oral intake hopefully in the next 24 hours.  Status is: Inpatient    Consultants:  Hematology/oncology: Dr. Tina  01/04/2024 Palliative care  Procedures: CT angiogram chest 01/04/2024 CT abdomen and pelvis 01/04/2024 Chest x-ray 01/04/2024   Antimicrobials:  Anti-infectives (From admission, onward)    Start     Dose/Rate Route Frequency Ordered Stop   01/05/24 1800   vancomycin  (VANCOREADY) IVPB 1250 mg/250 mL        1,250 mg 166.7 mL/hr over 90 Minutes Intravenous Every 24 hours 01/05/24 1746           Subjective: Patient sleeping deeply.  Objective: Vitals:   01/07/24 0525 01/07/24 1342 01/07/24 2034 01/08/24 0543  BP: 125/70 (!) 153/83 (!) 150/81 134/77  Pulse: 61 (!) 54 (!) 52 62  Resp: 16 18 20 16   Temp: 98.1 F (36.7 C) (!) 97.4 F (36.3 C) 97.7 F (36.5 C) 97.7 F (36.5 C)  TempSrc: Oral Oral Oral Oral  SpO2: 99% 96% 97% 100%  Weight:      Height:        Intake/Output Summary (Last 24 hours) at 01/08/2024 1014 Last data filed at 01/07/2024 1610 Gross per 24 hour  Intake 1296.83 ml  Output 1350 ml  Net -53.17 ml   Filed Weights   01/04/24 1456  Weight: 56.7 kg    Examination:  General exam: NAD. Respiratory system: CTAB anterior lung fields.  No wheezes, no crackles, no rhonchi.  Fair movement.  Speaking in full sentences.  Cardiovascular system: RRR no murmurs rubs or gallops.  No JVD.  No pitting lower extremity edema.  Gastrointestinal system: Abdomen soft, nontender, nondistended, positive bowel sounds.  No rebound.  No guarding.  Central nervous system: Alert and oriented. Moving extremities spontaneously.  No focal neurological deficits. Extremities: Symmetric 5 x 5 power. Skin: No rashes, lesions or ulcers Psychiatry: Judgement and insight appear normal. Mood & affect appropriate.     Data Reviewed: I have personally reviewed following labs and imaging studies  CBC: Recent Labs  Lab 01/04/24 1347 01/04/24 1618 01/05/24 0512 01/06/24 0601 01/07/24 0647 01/08/24 0809  WBC 2.8*  --  2.8* 3.6* 3.6* 6.1  NEUTROABS  --  2.1  --  2.7  --   --   HGB 8.7*  --  8.0* 8.0* 8.0* 8.7*  HCT 27.6*  --  24.8* 25.4* 25.1* 27.3*  MCV 69.5*  --  70.5* 69.8* 69.5* 69.3*  PLT 347  --  290 295 278 273    Basic Metabolic Panel: Recent Labs  Lab 01/04/24 1347 01/04/24 1618 01/05/24 0512 01/06/24 0601 01/07/24 0647  01/08/24 0809  NA 132*  --  131* 132* 129* 131*  K 2.7*  --  3.4* 4.0 4.3 4.5  CL 97*  --  100 102 97* 97*  CO2 24  --  24 23 27 25   GLUCOSE 96  --  91 90 91 105*  BUN 11  --  8 <5* <5* 9  CREATININE 0.86  --  0.72 0.62 0.68 0.85  CALCIUM  8.5*  --  8.5* 8.4* 8.3* 8.6*  MG  --  1.6* 1.8 1.7 1.7 2.2  PHOS  --   --   --  2.4* 2.5 2.6    GFR: Estimated Creatinine Clearance: 68.6 mL/min (by C-G formula based on SCr of 0.85 mg/dL).  Liver Function Tests: Recent Labs  Lab 01/04/24 1347 01/06/24 0601 01/07/24 0647 01/08/24 0809  AST 13*  --   --   --   ALT 8  --   --   --   ALKPHOS 540*  --   --   --  BILITOT 0.5  --   --   --   PROT 7.3  --   --   --   ALBUMIN 3.3* 2.6* 2.5* 2.8*    CBG: No results for input(s): GLUCAP in the last 168 hours.   Recent Results (from the past 240 hours)  Culture, blood (routine x 2)     Status: Abnormal   Collection Time: 01/04/24  4:18 PM   Specimen: BLOOD  Result Value Ref Range Status   Specimen Description   Final    BLOOD LEFT ANTECUBITAL Performed at [facility], [address], [place], [place] [address]    Special Requests   Final    BOTTLES DRAWN AEROBIC AND ANAEROBIC Blood Culture adequate volume Performed at [facility], [address] [place], [place] [address]    Culture  Setup Time   Final    GRAM POSITIVE COCCI IN CLUSTERS AEROBIC BOTTLE ONLY CRITICAL RESULT CALLED TO, READ BACK BY AND VERIFIED WITH: PHARMD A. PHAM [id number] @ 1728 FH    Culture (A)  Final    STAPHYLOCOCCUS HOMINIS THE SIGNIFICANCE OF ISOLATING THIS ORGANISM FROM A SINGLE SET OF BLOOD CULTURES WHEN MULTIPLE SETS ARE DRAWN IS UNCERTAIN. PLEASE NOTIFY THE MICROBIOLOGY DEPARTMENT WITHIN ONE WEEK IF SPECIATION AND SENSITIVITIES ARE REQUIRED. Performed at [facility], [address], [place], [place] [address]    Report Status 01/08/2024 FINAL  Final  Blood Culture ID Panel (Reflexed)     Status: Abnormal    Collection Time: 01/04/24  4:18 PM  Result Value Ref Range Status   Enterococcus faecalis NOT DETECTED NOT DETECTED Final   Enterococcus Faecium NOT DETECTED NOT DETECTED Final   Listeria monocytogenes NOT DETECTED NOT DETECTED Final   Staphylococcus species DETECTED (A) NOT DETECTED Final    Comment: CRITICAL RESULT CALLED TO, READ BACK BY AND VERIFIED WITH: PHARMD A. PHAM [id number] @ 1728 FH    Staphylococcus aureus (BCID) NOT DETECTED NOT DETECTED Final   Staphylococcus epidermidis NOT DETECTED NOT DETECTED Final   Staphylococcus lugdunensis NOT DETECTED NOT DETECTED Final   Streptococcus species NOT DETECTED NOT DETECTED Final   Streptococcus agalactiae NOT DETECTED NOT DETECTED Final   Streptococcus pneumoniae NOT DETECTED NOT DETECTED Final   Streptococcus pyogenes NOT DETECTED NOT DETECTED Final   A.calcoaceticus-baumannii NOT DETECTED NOT DETECTED Final   Bacteroides fragilis NOT DETECTED NOT DETECTED Final   Enterobacterales NOT DETECTED NOT DETECTED Final   Enterobacter cloacae complex NOT DETECTED NOT DETECTED Final   Escherichia coli NOT DETECTED NOT DETECTED Final   Klebsiella aerogenes NOT DETECTED NOT DETECTED Final   Klebsiella oxytoca NOT DETECTED NOT DETECTED Final   Klebsiella pneumoniae NOT DETECTED NOT DETECTED Final   Proteus species NOT DETECTED NOT DETECTED Final   Salmonella species NOT DETECTED NOT DETECTED Final   Serratia marcescens NOT DETECTED NOT DETECTED Final   Haemophilus influenzae NOT DETECTED NOT DETECTED Final   Neisseria meningitidis NOT DETECTED NOT DETECTED Final   Pseudomonas aeruginosa NOT DETECTED NOT DETECTED Final   Stenotrophomonas maltophilia NOT DETECTED NOT DETECTED Final   Candida albicans NOT DETECTED NOT DETECTED Final   Candida auris NOT DETECTED NOT DETECTED Final   Candida glabrata NOT DETECTED NOT DETECTED Final   Candida krusei NOT DETECTED NOT DETECTED Final   Candida parapsilosis NOT DETECTED NOT DETECTED Final   Candida  tropicalis NOT DETECTED NOT DETECTED Final   Cryptococcus neoformans/gattii NOT DETECTED NOT DETECTED Final    Comment: Performed at  [facility], [address] [place], [place] [address]  Resp panel by RT-PCR (RSV, Flu A&B, Covid) Anterior Nasal Swab     Status: None   Collection Time: 01/04/24  4:31 PM   Specimen: Anterior Nasal Swab  Result Value Ref Range Status   SARS Coronavirus 2 by RT PCR NEGATIVE NEGATIVE Final    Comment: (NOTE) SARS-CoV-2 target nucleic acids are NOT DETECTED.  The SARS-CoV-2 RNA is generally detectable in upper respiratory specimens during the acute phase of infection. The lowest concentration of SARS-CoV-2 viral copies this assay can detect is 138 copies/mL. A negative result does not preclude SARS-Cov-2 infection and should not be used as the sole basis for treatment or other patient management decisions. A negative result may occur with  improper specimen collection/handling, submission of specimen other than nasopharyngeal swab, presence of viral mutation(s) within the areas targeted by this assay, and inadequate number of viral copies(<138 copies/mL). A negative result must be combined with clinical observations, patient history, and epidemiological information. The expected result is Negative.  Fact Sheet for Patients:  BloggerCourse.com  Fact Sheet for Healthcare Providers:  SeriousBroker.it  This test is no t yet approved or cleared by the [place]  FDA and  has been authorized for detection and/or diagnosis of SARS-CoV-2 by FDA under an Emergency Use Authorization (EUA). This EUA will remain  in effect (meaning this test can be used) for the duration of the COVID-19 declaration under Section 564(b)(1) of the Act, 21 U.S.C.section 360bbb-3(b)(1), unless the authorization is terminated  or revoked sooner.       Influenza A by PCR NEGATIVE NEGATIVE Final   Influenza B by PCR  NEGATIVE NEGATIVE Final    Comment: (NOTE) The Xpert Xpress SARS-CoV-2/FLU/RSV plus assay is intended as an aid in the diagnosis of influenza from Nasopharyngeal swab specimens and should not be used as a sole basis for treatment. Nasal washings and aspirates are unacceptable for Xpert Xpress SARS-CoV-2/FLU/RSV testing.  Fact Sheet for Patients: BloggerCourse.com  Fact Sheet for Healthcare Providers: SeriousBroker.it  This test is not yet approved or cleared by the [place]  FDA and has been authorized for detection and/or diagnosis of SARS-CoV-2 by FDA under an Emergency Use Authorization (EUA). This EUA will remain in effect (meaning this test can be used) for the duration of the COVID-19 declaration under Section 564(b)(1) of the Act, 21 U.S.C. section 360bbb-3(b)(1), unless the authorization is terminated or revoked.     Resp Syncytial Virus by PCR NEGATIVE NEGATIVE Final    Comment: (NOTE) Fact Sheet for Patients: BloggerCourse.com  Fact Sheet for Healthcare Providers: SeriousBroker.it  This test is not yet approved or cleared by the [place]  FDA and has been authorized for detection and/or diagnosis of SARS-CoV-2 by FDA under an Emergency Use Authorization (EUA). This EUA will remain in effect (meaning this test can be used) for the duration of the COVID-19 declaration under Section 564(b)(1) of the Act, 21 U.S.C. section 360bbb-3(b)(1), unless the authorization is terminated or revoked.  Performed at [facility], [address], [place], [place] [address]   Culture, blood (routine x 2)     Status: None (Preliminary result)   Collection Time: 01/04/24 10:19 PM   Specimen: BLOOD RIGHT HAND  Result Value Ref Range Status   Specimen Description BLOOD RIGHT HAND  Final   Special Requests   Final    BOTTLES DRAWN AEROBIC ONLY Blood Culture  results may not be optimal due to an inadequate volume  of blood received in culture bottles   Culture   Final    NO GROWTH 3 DAYS Performed at [facility], [address], [place], [place] [address]    Report Status PENDING  Incomplete         Radiology Studies: No results found.       Scheduled Meds:  carvedilol   12.5 mg Oral BID WC   feeding supplement  237 mL Oral BID BM   ferrous sulfate   325 mg Oral QAC breakfast   nicotine   14 mg Transdermal Daily   oxyCODONE   30 mg Oral Q8H   pantoprazole   40 mg Oral BID AC   phosphorus  250 mg Oral BID   polyethylene glycol  17 g Oral Daily   senna  1 tablet Oral BID   Continuous Infusions:  vancomycin  1,250 mg (01/07/24 1818)     LOS: 3 days    Time spent: 35 minutes    Toribio Hummer, MD [facility]   To contact the attending provider between 7A-7P or the covering provider during after hours 7P-7A, please log into the web site [email or web address] and access using universal [place] password for that web site. If you do not have the password, please call the hospital operator.  01/08/2024, 10:14 AM

## 2024-01-08 NOTE — Progress Notes (Signed)
 Chaplains received a consult for assisting Troy Mclaughlin with advance directives.  He was very drowsy during time of our visit and shared that he was in pain. He was able to reach out to his brother and he now knows about Troy Mclaughlin's health conditions right now.  He is also receiving support from some friends. I asked a few questions related to advance directives as well as related to spiritual needs at this time.  He was able to share some, but became very drowsy again. I alerted his nurse about his pain level and will attempt to visit at a later time.

## 2024-01-08 NOTE — Plan of Care (Signed)

## 2024-01-08 NOTE — TOC Progression Note (Signed)
 Transition of Care [facility]) - Progression Note    Patient Details  Name: Troy Mclaughlin MRN: [id number] Date of Birth: March 26, 1956  Transition of Care [facility]) CM/SW Contact  [name], RN Phone Number:[phone]  01/08/2024, 12:17 PM  Clinical Narrative:    CM at bedside and informed patient that it is  CM understanding that patient is ready to make a decision about Hospice. Patient agrees that he is ready for Hospice referral. Patient has medicare.gov list and agrees to referral with [facility]. Referral has been called and accepted by [facility].                      Expected Discharge Plan and Services                                               Social Drivers of Health (SDOH) Interventions SDOH Screenings   Food Insecurity: Food Insecurity Present (01/04/2024)  Housing: Low Risk  (01/04/2024)  Transportation Needs: No Transportation Needs (01/04/2024)  Recent Concern: Transportation Needs - Unmet Transportation Needs (11/11/2023)  Utilities: Not At Risk (01/04/2024)  Alcohol  Screen: Low Risk  (12/25/2020)  Depression (PHQ2-9): Medium Risk (06/07/2023)  Financial Resource Strain: High Risk (05/05/2020)  Physical Activity: Insufficiently Active (12/25/2020)  Social Connections: Moderately Isolated (01/04/2024)  Stress: No Stress Concern Present (12/25/2020)  Tobacco Use: High Risk (01/04/2024)    Readmission Risk Interventions    11/10/2023    1:21 PM 07/19/2023   11:17 AM 07/18/2023    4:10 PM  Readmission Risk Prevention Plan  Transportation Screening Complete Complete Complete  PCP or Specialist Appt within 5-7 Days  Complete   Home Care Screening  Complete   Medication Review (RN CM)  Complete   Medication Review (RN Care Manager) Complete    PCP or Specialist appointment within 3-5 days of discharge Complete    HRI or Home Care Consult Complete    SW Recovery Care/Counseling Consult Complete    Palliative Care Screening Not  Applicable    Skilled Nursing Facility Not Applicable

## 2024-01-09 ENCOUNTER — Other Ambulatory Visit: Payer: Self-pay

## 2024-01-09 ENCOUNTER — Other Ambulatory Visit (HOSPITAL_COMMUNITY): Payer: Self-pay

## 2024-01-09 DIAGNOSIS — F17200 Nicotine dependence, unspecified, uncomplicated: Secondary | ICD-10-CM

## 2024-01-09 DIAGNOSIS — K5900 Constipation, unspecified: Secondary | ICD-10-CM | POA: Diagnosis not present

## 2024-01-09 DIAGNOSIS — R112 Nausea with vomiting, unspecified: Secondary | ICD-10-CM | POA: Diagnosis not present

## 2024-01-09 DIAGNOSIS — G893 Neoplasm related pain (acute) (chronic): Secondary | ICD-10-CM | POA: Diagnosis not present

## 2024-01-09 DIAGNOSIS — C799 Secondary malignant neoplasm of unspecified site: Secondary | ICD-10-CM

## 2024-01-09 DIAGNOSIS — I10 Essential (primary) hypertension: Secondary | ICD-10-CM | POA: Diagnosis not present

## 2024-01-09 LAB — BASIC METABOLIC PANEL WITH GFR
Anion gap: 11 (ref 5–15)
BUN: 14 mg/dL (ref 8–23)
CO2: 25 mmol/L (ref 22–32)
Calcium: 8.6 mg/dL — ABNORMAL LOW (ref 8.9–10.3)
Chloride: 97 mmol/L — ABNORMAL LOW (ref 98–111)
Creatinine, Ser: 0.92 mg/dL (ref 0.61–1.24)
GFR, Estimated: >60 mL/min (ref >60)
Glucose, Bld: 94 mg/dL (ref 70–99)
Potassium: 4.3 mmol/L (ref 3.5–5.1)
Sodium: 133 mmol/L — ABNORMAL LOW (ref 135–145)

## 2024-01-09 LAB — CBC
HCT: 26.3 % — ABNORMAL LOW (ref 39.0–52.0)
Hemoglobin: 8.4 g/dL — ABNORMAL LOW (ref 13.0–17.0)
MCH: 22.2 pg — ABNORMAL LOW (ref 26.0–34.0)
MCHC: 31.9 g/dL (ref 30.0–36.0)
MCV: 69.4 fL — ABNORMAL LOW (ref 80.0–100.0)
Platelets: 248 K/uL (ref 150–400)
RBC: 3.79 MIL/uL — ABNORMAL LOW (ref 4.22–5.81)
RDW: 17.7 % — ABNORMAL HIGH (ref 11.5–15.5)
WBC: 5.1 K/uL (ref 4.0–10.5)
nRBC: 0 % (ref 0.0–0.2)

## 2024-01-09 MED ORDER — POLYETHYLENE GLYCOL 3350 17 GM/SCOOP PO POWD
17.0000 g | Freq: Every day | ORAL | 0 refills | Status: DC
  Filled 2024-01-09: qty 238, 14d supply, fill #0

## 2024-01-09 MED ORDER — OXYCODONE HCL 20 MG PO TABS
20.0000 mg | ORAL_TABLET | ORAL | 0 refills | Status: DC | PRN
  Filled 2024-01-09: qty 30, 5d supply, fill #0

## 2024-01-09 MED ORDER — NICOTINE 14 MG/24HR TD PT24
14.0000 mg | MEDICATED_PATCH | Freq: Every day | TRANSDERMAL | 0 refills | Status: DC
  Filled 2024-01-09: qty 28, 28d supply, fill #0

## 2024-01-09 MED ORDER — OXYCODONE HCL 20 MG PO TABS
20.0000 mg | ORAL_TABLET | ORAL | 0 refills | Status: DC | PRN
Start: 2024-01-09 — End: 2024-01-09
  Filled 2024-01-09: qty 15, 3d supply, fill #0

## 2024-01-09 MED ORDER — XTAMPZA ER 13.5 MG PO C12A
13.5000 mg | EXTENDED_RELEASE_CAPSULE | Freq: Two times a day (BID) | ORAL | 0 refills | Status: DC
Start: 2024-01-09 — End: 2024-03-28
  Filled 2024-01-09 – 2024-03-22 (×3): qty 14, 7d supply, fill #0

## 2024-01-09 MED ORDER — PANTOPRAZOLE SODIUM 40 MG PO TBEC
40.0000 mg | DELAYED_RELEASE_TABLET | Freq: Two times a day (BID) | ORAL | 1 refills | Status: DC
  Filled 2024-01-09: qty 60, 30d supply, fill #0

## 2024-01-09 MED ORDER — XTAMPZA ER 13.5 MG PO C12A
13.5000 mg | EXTENDED_RELEASE_CAPSULE | Freq: Two times a day (BID) | ORAL | 0 refills | Status: DC
Start: 2024-01-09 — End: 2024-01-09
  Filled 2024-01-09: qty 10, 5d supply, fill #0

## 2024-01-09 NOTE — Progress Notes (Signed)
 [facility] called at this time per this nurse regarding transportation to his residence.

## 2024-01-09 NOTE — Progress Notes (Signed)
 Daily Progress Note   Patient Name: Troy Mclaughlin       Date: 01/09/2024 DOB: Dec 27, 1955  Age: 68 y.o. MRN#: [id number] Attending Physician: Sebastian Toribio GAILS, MD Primary Care Physician: Oley Bascom RAMAN, NP Admit Date: 01/04/2024 Length of Stay: 4 days  Reason for Consultation/Follow-up: Establishing goals of care and symptom management  Subjective:   CC: Patient is resting in bed, he states that he is going home. Still appears withdrawn, avoids eye contact, doesn't wish to engage in GOC.    Following up regarding complex medical decision making and symptom management.  Subjective:  Reviewed recent documentation from hospitalist.    EMR reviewed   Presented to bedside to see patient.  No visitors present at bedside.     Review of Systems Cancer related pain Objective:   Vital Signs:  BP (!) 142/69 (BP Location: Right Arm)   Pulse 68   Temp 98.8 F (37.1 C)   Resp 15   Ht 5' 9 (1.753 m)   Wt 56.7 kg   SpO2 97%   BMI 18.46 kg/m   Physical Exam: General: NAD, alert, cachectic, frail Cardiovascular: RRR Respiratory: no increased work of breathing noted, not in respiratory distress Abdomen: not distended Neuro: A&Ox4, following commands easily Psych: appropriately answers all questions  Assessment & Plan:   Assessment: Patient is a 68 year old male with a past medical history of hypertension, anemia of chronic disease, hyperlipidemia, and 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  During hospitalization patient has received management for pain, nausea and vomiting, and electrolyte abnormalities.  Oncology consulted to provide recommendations.  Palliative medicine team consulted to assist with complex medical decision making. Of note patient follows with outpatient PM[facility] at [facility].  Recommendations/Plan: # Complex medical decision making/goals  of care:      - Discussed care planning with patient at bedside as detailed above in HPI.  Oncology has already recommended patient return home with hospice support due to progression of cancer on cancer directed therapies.     Noted palliative medicine team to continue to engage in conversations as able and appropriate moving forward.                -  Code Status: Full Code    - Again attempted to discuss CODE STATUS with patient on 01/09/2024.  Again expressed concern that with patient's underlying metastatic cancer, if he were sick and of his heart were to stop or he would stop breathing, intervention such as cardiac resuscitation and intubation with mechanical ventilation would not lead to quality of life outcomes.  Have again encouraged patient to consider this.  At this time remains full code.  I will think about all this when I get home.  # Symptom management Patient is receiving these palliative interventions for symptom management with an intent to improve quality of life.                 - Pain, severe acute on chronic cancer pain in setting of metastatic prostate cancer At home receives Xtampza  13.5 mg every 12 hours and as needed oxycodone  10 mg every 4 hours as needed.  Xtampza  not on formulary.  Will need to be placed back on appropriate/adjusted dose of Xtampza  at time of discharge.                               -  Continue OxyContin  to 30mg  mg every 8 hours during the day                               - Continue oxycodone  to 20 mg every 4 hours as needed                               - Continue IV hydromorphone  to 1 mg every 4 hours as needed breakthrough pain after oral opioids                  - Constipation                With patient receiving opioids, needs scheduled bowel regimen.                                                       - Continue senna 1 tab twice daily                               - Change to MiraLAX  17 g daily   # Psycho-social/Spiritual Support:  - Support  System:  patient reports he and wife, Troy Mclaughlin, are married   # Discharge Planning: Home with hospice  [facility] note reviewed, oncology note reviewed. Discussed with: Patient    Thank you for allowing the palliative care team to participate in the care Troy Mclaughlin.  low MDM Lonia Serve MD Palliative Care Provider PMT # [phone]  If patient remains symptomatic despite maximum doses, please call PMT at [phone] between 0700 and 1900. Outside of these hours, please call attending, as PMT does not have night coverage.

## 2024-01-09 NOTE — TOC Transition Note (Addendum)
 Transition of Care [facility] - Discharge Note   Patient Details  Name: Troy Mclaughlin MRN: [id number] Date of Birth: [birth year]  Transition of Care [facility] CM/SW Contact:  [name], RN Phone Number:[phone]  01/09/2024, 2:03 PM   Clinical Narrative:    CM at bedside to discuss discharge with patient. Patient is sleeping soundly. CM called wife to make her aware of MD plan to discharge today. Wife verbalized understanding. Wife states that she is unable to transport patient. CM informed wife that CM can arrange transportation. Wife is in agreement and says that she will be waiting on patient to arrive.   [address] Shawn with [facility] has been updated for Hospice to follow.          Patient Goals and CMS Choice            Discharge Placement                       Discharge Plan and Services Additional resources added to the After Visit Summary for                                       Social Drivers of Health (SDOH) Interventions SDOH Screenings   Food Insecurity: Food Insecurity Present (01/04/2024)  Housing: Low Risk  (01/04/2024)  Transportation Needs: No Transportation Needs (01/04/2024)  Recent Concern: Transportation Needs - Unmet Transportation Needs (11/11/2023)  Utilities: Not At Risk (01/04/2024)  Alcohol  Screen: Low Risk  (12/25/2020)  Depression (PHQ2-9): Medium Risk (06/07/2023)  Financial Resource Strain: High Risk (05/05/2020)  Physical Activity: Insufficiently Active (12/25/2020)  Social Connections: Moderately Isolated (01/04/2024)  Stress: No Stress Concern Present (12/25/2020)  Tobacco Use: High Risk (01/04/2024)     Readmission Risk Interventions    11/10/2023    1:21 PM 07/19/2023   11:17 AM 07/18/2023    4:10 PM  Readmission Risk Prevention Plan  Transportation Screening Complete Complete Complete  PCP or Specialist Appt within 5-7 Days  Complete   Home Care Screening  Complete   Medication Review (RN CM)  Complete   Medication  Review (RN Care Manager) Complete    PCP or Specialist appointment within 3-5 days of discharge Complete    HRI or Home Care Consult Complete    SW Recovery Care/Counseling Consult Complete    Palliative Care Screening Not Applicable    Skilled Nursing Facility Not Applicable

## 2024-01-09 NOTE — Plan of Care (Signed)
°  Problem: Clinical Measurements: °Goal: Diagnostic test results will improve °Outcome: Progressing °  °Problem: Clinical Measurements: °Goal: Ability to maintain clinical measurements within normal limits will improve °Outcome: Progressing °  °

## 2024-01-09 NOTE — Progress Notes (Addendum)
 Troy Mclaughlin 1605 - [facility] Liaison Note  Received request from [place], Transitions of Care Manager, for hospice services at home after discharge. Spoke with patient, spouse, and brother to initiate education related to hospice philosophy, services, and team approach to care. Patient/family verbalized understanding of information given.   DME needs discussed. Patient has the no DME in the home. Patient/family requests the following equipment for delivery: wheelchair, cane, wheeled walker, and shower bench.   Discharge location is TBD. Patient's spouse reports that she does not feel good about him returning home. His brother reports he is unable to have him stay at him home. Discussed LTC option with family, patient and with transition of care manager.   Hospital liaisons will follow for discharge disposition.   [facility] information and contact numbers given to patient and family. Above information shared with [name], Transitions of Care Manager.  Please call with any questions or concerns.   Thank you for the opportunity to participate in this patient's care.   Update: Notified by [facility] that patient is going home with spouse. Notified DME delivery and our office to schedule hospice nurse visit with discharge likely today.   [name] BSN, Charity fundraiser, OCN [employer] [phone]

## 2024-01-09 NOTE — Progress Notes (Signed)
 Patient currently remains inpatient for his metastatic prostate cancer and plans to be discharged on hospice care.   RN coordinated for upcoming dental consult and transportation to be cancelled.

## 2024-01-09 NOTE — Progress Notes (Signed)
 Discharge medications delivered to patient at the bedside in  secure bag. JONETTA Ryder RN
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
## 2024-01-09 NOTE — Progress Notes (Signed)
 PT Cancellation Note  Patient Details Name: Troy Mclaughlin MRN: [id number] DOB: 29-Dec-1955   Cancelled Treatment:    Reason Eval/Treat Not Completed: Other (comment). Per chart review, pt planning to d/c home with hospice. TOC reports transportation arranged, noted DME arranged in chart review; PT will sign off.    Metta Ave PT, DPT 01/09/24, 2:33 PM

## 2024-01-10 ENCOUNTER — Other Ambulatory Visit: Payer: Self-pay

## 2024-01-10 ENCOUNTER — Other Ambulatory Visit (HOSPITAL_COMMUNITY): Payer: Self-pay

## 2024-01-10 LAB — CULTURE, BLOOD (ROUTINE X 2): Culture: NO GROWTH

## 2024-01-10 MED ORDER — METOCLOPRAMIDE HCL 5 MG PO TABS
5.0000 mg | ORAL_TABLET | Freq: Three times a day (TID) | ORAL | 0 refills | Status: DC
  Filled 2024-01-10 (×3): qty 42, 14d supply, fill #0

## 2024-01-10 MED ORDER — MIRTAZAPINE 15 MG PO TABS
15.0000 mg | ORAL_TABLET | Freq: Every day | ORAL | 0 refills | Status: DC
  Filled 2024-01-10 (×3): qty 30, 30d supply, fill #0

## 2024-01-11 ENCOUNTER — Other Ambulatory Visit (HOSPITAL_COMMUNITY): Payer: Self-pay

## 2024-01-11 MED ORDER — SENNA 8.6 MG PO TABS
8.6000 mg | ORAL_TABLET | Freq: Every day | ORAL | 0 refills | Status: DC
  Filled 2024-01-11: qty 30, 30d supply, fill #0

## 2024-01-12 ENCOUNTER — Other Ambulatory Visit: Payer: Self-pay

## 2024-01-15 ENCOUNTER — Other Ambulatory Visit (HOSPITAL_COMMUNITY): Payer: Self-pay

## 2024-01-15 MED ORDER — OXYCODONE HCL 20 MG PO TABS
20.0000 mg | ORAL_TABLET | ORAL | 0 refills | Status: DC | PRN
  Filled 2024-01-15: qty 30, 5d supply, fill #0

## 2024-01-16 ENCOUNTER — Other Ambulatory Visit (HOSPITAL_COMMUNITY): Payer: Self-pay

## 2024-01-18 ENCOUNTER — Other Ambulatory Visit (HOSPITAL_COMMUNITY): Payer: Self-pay

## 2024-01-19 ENCOUNTER — Inpatient Hospital Stay

## 2024-01-23 ENCOUNTER — Other Ambulatory Visit (HOSPITAL_COMMUNITY): Payer: Self-pay

## 2024-01-23 MED ORDER — OXYCODONE HCL 20 MG PO TABS
20.0000 mg | ORAL_TABLET | ORAL | 0 refills | Status: DC | PRN
  Filled 2024-01-23: qty 15, 3d supply, fill #0

## 2024-01-25 ENCOUNTER — Other Ambulatory Visit (HOSPITAL_COMMUNITY): Payer: Self-pay

## 2024-01-25 MED ORDER — MIRTAZAPINE 15 MG PO TABS
15.0000 mg | ORAL_TABLET | Freq: Every day | ORAL | 0 refills | Status: DC
  Filled 2024-01-25: qty 30, 30d supply, fill #0

## 2024-01-25 MED ORDER — OXYCODONE HCL 20 MG PO TABS
20.0000 mg | ORAL_TABLET | ORAL | 0 refills | Status: DC
  Filled 2024-01-25: qty 90, 15d supply, fill #0

## 2024-02-06 ENCOUNTER — Other Ambulatory Visit (HOSPITAL_COMMUNITY): Payer: Self-pay

## 2024-02-06 ENCOUNTER — Encounter (HOSPITAL_COMMUNITY): Admission: RE | Admit: 2024-02-06 | Source: Ambulatory Visit

## 2024-02-06 MED ORDER — OXYCODONE HCL 20 MG PO TABS
20.0000 mg | ORAL_TABLET | ORAL | 0 refills | Status: DC | PRN
  Filled 2024-02-06: qty 90, 15d supply, fill #0

## 2024-02-08 ENCOUNTER — Other Ambulatory Visit (HOSPITAL_COMMUNITY): Payer: Self-pay

## 2024-02-08 MED ORDER — METHADONE HCL 5 MG PO TABS
5.0000 mg | ORAL_TABLET | Freq: Three times a day (TID) | ORAL | 0 refills | Status: DC
  Filled 2024-02-08: qty 45, 15d supply, fill #0

## 2024-02-19 ENCOUNTER — Other Ambulatory Visit (HOSPITAL_COMMUNITY): Payer: Self-pay

## 2024-02-19 MED ORDER — PROCHLORPERAZINE MALEATE 10 MG PO TABS
10.0000 mg | ORAL_TABLET | ORAL | 0 refills | Status: DC
  Filled 2024-02-19: qty 42, 7d supply, fill #0

## 2024-02-19 MED ORDER — METHADONE HCL 5 MG PO TABS
5.0000 mg | ORAL_TABLET | Freq: Three times a day (TID) | ORAL | 0 refills | Status: DC
  Filled 2024-02-19: qty 45, 15d supply, fill #0

## 2024-02-19 MED ORDER — OXYCODONE HCL 20 MG PO TABS
20.0000 mg | ORAL_TABLET | ORAL | 0 refills | Status: DC
  Filled 2024-02-19: qty 90, 15d supply, fill #0

## 2024-02-22 ENCOUNTER — Other Ambulatory Visit: Payer: Self-pay

## 2024-02-22 ENCOUNTER — Other Ambulatory Visit (HOSPITAL_COMMUNITY): Payer: Self-pay

## 2024-02-22 MED ORDER — MIRTAZAPINE 15 MG PO TABS
15.0000 mg | ORAL_TABLET | Freq: Every evening | ORAL | 0 refills | Status: DC
  Filled 2024-02-22: qty 30, 30d supply, fill #0

## 2024-02-22 MED ORDER — AMLODIPINE BESYLATE 10 MG PO TABS
10.0000 mg | ORAL_TABLET | Freq: Every day | ORAL | 3 refills | Status: DC
  Filled 2024-02-22: qty 30, 30d supply, fill #0
  Filled 2024-03-07: qty 30, 30d supply, fill #1

## 2024-02-22 MED ORDER — PANTOPRAZOLE SODIUM 40 MG PO TBEC
40.0000 mg | DELAYED_RELEASE_TABLET | Freq: Two times a day (BID) | ORAL | 3 refills | Status: DC
  Filled 2024-02-22: qty 60, 30d supply, fill #0
  Filled 2024-03-07 (×2): qty 60, 30d supply, fill #1

## 2024-02-22 MED ORDER — CARVEDILOL 12.5 MG PO TABS
12.5000 mg | ORAL_TABLET | Freq: Every day | ORAL | 3 refills | Status: DC
  Filled 2024-02-22: qty 30, 30d supply, fill #0
  Filled 2024-03-07 (×2): qty 30, 30d supply, fill #1

## 2024-02-22 MED ORDER — PREDNISONE 10 MG PO TABS
10.0000 mg | ORAL_TABLET | Freq: Every day | ORAL | 3 refills | Status: DC
  Filled 2024-02-22: qty 30, 30d supply, fill #0
  Filled 2024-03-07 (×2): qty 30, 30d supply, fill #1

## 2024-03-07 ENCOUNTER — Other Ambulatory Visit: Payer: Self-pay | Admitting: Nurse Practitioner

## 2024-03-07 ENCOUNTER — Other Ambulatory Visit (HOSPITAL_COMMUNITY): Payer: Self-pay

## 2024-03-07 MED ORDER — PREDNISONE 10 MG PO TABS
10.0000 mg | ORAL_TABLET | Freq: Every day | ORAL | 3 refills | Status: DC
  Filled 2024-03-07: qty 15, 15d supply, fill #0

## 2024-03-07 MED ORDER — SENNOSIDES 8.6 MG PO TABS
2.0000 | ORAL_TABLET | Freq: Two times a day (BID) | ORAL | 3 refills | Status: DC
  Filled 2024-03-07: qty 60, 15d supply, fill #0

## 2024-03-07 MED ORDER — METHADONE HCL 5 MG PO TABS
7.5000 mg | ORAL_TABLET | Freq: Three times a day (TID) | ORAL | 0 refills | Status: DC
  Filled 2024-03-07: qty 32, 7d supply, fill #0

## 2024-03-07 MED ORDER — OXYCODONE HCL 20 MG PO TABS
20.0000 mg | ORAL_TABLET | ORAL | 0 refills | Status: DC
  Filled 2024-03-07: qty 90, 15d supply, fill #0

## 2024-03-07 MED ORDER — PROCHLORPERAZINE MALEATE 10 MG PO TABS
10.0000 mg | ORAL_TABLET | ORAL | 0 refills | Status: DC
  Filled 2024-03-07: qty 90, 15d supply, fill #0

## 2024-03-07 MED ORDER — TRAZODONE HCL 50 MG PO TABS
50.0000 mg | ORAL_TABLET | Freq: Every day | ORAL | 3 refills | Status: DC
  Filled 2024-03-07: qty 15, 15d supply, fill #0

## 2024-03-07 MED ORDER — METOCLOPRAMIDE HCL 5 MG PO TABS
5.0000 mg | ORAL_TABLET | Freq: Three times a day (TID) | ORAL | 0 refills | Status: DC
  Filled 2024-03-07: qty 45, 15d supply, fill #0

## 2024-03-07 MED ORDER — CARVEDILOL 12.5 MG PO TABS
12.5000 mg | ORAL_TABLET | Freq: Every day | ORAL | 3 refills | Status: DC
  Filled 2024-03-07: qty 15, 15d supply, fill #0

## 2024-03-07 MED ORDER — AMLODIPINE BESYLATE 10 MG PO TABS
10.0000 mg | ORAL_TABLET | Freq: Every day | ORAL | 3 refills | Status: DC
  Filled 2024-03-07: qty 15, 15d supply, fill #0

## 2024-03-07 NOTE — Telephone Encounter (Signed)
 Please advise [facility]

## 2024-03-08 ENCOUNTER — Other Ambulatory Visit (HOSPITAL_COMMUNITY): Payer: Self-pay

## 2024-03-19 ENCOUNTER — Inpatient Hospital Stay (HOSPITAL_COMMUNITY): Admission: RE | Admit: 2024-03-19 | Source: Ambulatory Visit

## 2024-03-21 ENCOUNTER — Other Ambulatory Visit (HOSPITAL_COMMUNITY): Payer: Self-pay

## 2024-03-21 MED ORDER — OXYCODONE HCL 20 MG PO TABS
20.0000 mg | ORAL_TABLET | ORAL | 0 refills | Status: DC | PRN
  Filled 2024-03-21: qty 90, 15d supply, fill #0

## 2024-03-22 ENCOUNTER — Other Ambulatory Visit (HOSPITAL_COMMUNITY): Payer: Self-pay

## 2024-03-23 ENCOUNTER — Other Ambulatory Visit (HOSPITAL_COMMUNITY): Payer: Self-pay

## 2024-03-27 ENCOUNTER — Encounter (HOSPITAL_COMMUNITY): Payer: Self-pay | Admitting: Student

## 2024-03-27 ENCOUNTER — Inpatient Hospital Stay (HOSPITAL_COMMUNITY)
Admission: EM | Admit: 2024-03-27 | Discharge: 2024-03-28 | DRG: 947 | Disposition: A | Discharge status: Hospice | Attending: Internal Medicine | Admitting: Internal Medicine

## 2024-03-27 ENCOUNTER — Emergency Department (HOSPITAL_COMMUNITY)

## 2024-03-27 ENCOUNTER — Other Ambulatory Visit: Payer: Self-pay

## 2024-03-27 DIAGNOSIS — G9341 Metabolic encephalopathy: Secondary | ICD-10-CM | POA: Diagnosis present

## 2024-03-27 DIAGNOSIS — Z933 Colostomy status: Secondary | ICD-10-CM

## 2024-03-27 DIAGNOSIS — Z515 Encounter for palliative care: Secondary | ICD-10-CM

## 2024-03-27 DIAGNOSIS — Z8711 Personal history of peptic ulcer disease: Secondary | ICD-10-CM

## 2024-03-27 DIAGNOSIS — R64 Cachexia: Secondary | ICD-10-CM | POA: Diagnosis present

## 2024-03-27 DIAGNOSIS — Z8249 Family history of ischemic heart disease and other diseases of the circulatory system: Secondary | ICD-10-CM

## 2024-03-27 DIAGNOSIS — N179 Acute kidney failure, unspecified: Secondary | ICD-10-CM | POA: Diagnosis present

## 2024-03-27 DIAGNOSIS — R059 Cough, unspecified: Secondary | ICD-10-CM | POA: Diagnosis present

## 2024-03-27 DIAGNOSIS — I959 Hypotension, unspecified: Secondary | ICD-10-CM | POA: Diagnosis present

## 2024-03-27 DIAGNOSIS — E119 Type 2 diabetes mellitus without complications: Secondary | ICD-10-CM | POA: Diagnosis present

## 2024-03-27 DIAGNOSIS — F17290 Nicotine dependence, other tobacco product, uncomplicated: Secondary | ICD-10-CM | POA: Diagnosis present

## 2024-03-27 DIAGNOSIS — I252 Old myocardial infarction: Secondary | ICD-10-CM

## 2024-03-27 DIAGNOSIS — I10 Essential (primary) hypertension: Secondary | ICD-10-CM | POA: Diagnosis present

## 2024-03-27 DIAGNOSIS — G893 Neoplasm related pain (acute) (chronic): Principal | ICD-10-CM | POA: Diagnosis present

## 2024-03-27 DIAGNOSIS — Z66 Do not resuscitate: Secondary | ICD-10-CM | POA: Diagnosis present

## 2024-03-27 DIAGNOSIS — C7951 Secondary malignant neoplasm of bone: Secondary | ICD-10-CM | POA: Diagnosis present

## 2024-03-27 DIAGNOSIS — Z7189 Other specified counseling: Secondary | ICD-10-CM | POA: Diagnosis not present

## 2024-03-27 DIAGNOSIS — Z79899 Other long term (current) drug therapy: Secondary | ICD-10-CM

## 2024-03-27 DIAGNOSIS — Z8673 Personal history of transient ischemic attack (TIA), and cerebral infarction without residual deficits: Secondary | ICD-10-CM | POA: Diagnosis not present

## 2024-03-27 DIAGNOSIS — F419 Anxiety disorder, unspecified: Secondary | ICD-10-CM | POA: Diagnosis present

## 2024-03-27 DIAGNOSIS — E860 Dehydration: Secondary | ICD-10-CM | POA: Diagnosis present

## 2024-03-27 DIAGNOSIS — N400 Enlarged prostate without lower urinary tract symptoms: Secondary | ICD-10-CM | POA: Diagnosis present

## 2024-03-27 DIAGNOSIS — R52 Pain, unspecified: Secondary | ICD-10-CM | POA: Diagnosis not present

## 2024-03-27 DIAGNOSIS — C61 Malignant neoplasm of prostate: Principal | ICD-10-CM

## 2024-03-27 DIAGNOSIS — D638 Anemia in other chronic diseases classified elsewhere: Secondary | ICD-10-CM | POA: Diagnosis present

## 2024-03-27 DIAGNOSIS — R627 Adult failure to thrive: Secondary | ICD-10-CM | POA: Diagnosis present

## 2024-03-27 DIAGNOSIS — Z800 Family history of malignant neoplasm of digestive organs: Secondary | ICD-10-CM

## 2024-03-27 DIAGNOSIS — Z789 Other specified health status: Secondary | ICD-10-CM | POA: Diagnosis not present

## 2024-03-27 DIAGNOSIS — R109 Unspecified abdominal pain: Secondary | ICD-10-CM | POA: Diagnosis present

## 2024-03-27 LAB — COMPREHENSIVE METABOLIC PANEL WITH GFR
ALT: 7 U/L (ref 0–44)
AST: 48 U/L — ABNORMAL HIGH (ref 15–41)
Albumin: 3.6 g/dL (ref 3.5–5.0)
Alkaline Phosphatase: 1266 U/L — ABNORMAL HIGH (ref 38–126)
Anion gap: 40 — ABNORMAL HIGH (ref 5–15)
BUN: 34 mg/dL — ABNORMAL HIGH (ref 8–23)
CO2: 7 mmol/L — ABNORMAL LOW (ref 22–32)
Calcium: 10.3 mg/dL (ref 8.9–10.3)
Chloride: 88 mmol/L — ABNORMAL LOW (ref 98–111)
Creatinine, Ser: 2.14 mg/dL — ABNORMAL HIGH (ref 0.61–1.24)
GFR, Estimated: 33 mL/min — ABNORMAL LOW (ref >60)
Glucose, Bld: 102 mg/dL — ABNORMAL HIGH (ref 70–99)
Potassium: 4.1 mmol/L (ref 3.5–5.1)
Sodium: 135 mmol/L (ref 135–145)
Total Bilirubin: 0.5 mg/dL (ref 0.0–1.2)
Total Protein: 7.3 g/dL (ref 6.5–8.1)

## 2024-03-27 LAB — CBC WITH DIFFERENTIAL/PLATELET
Abs Immature Granulocytes: 0.21 K/uL — ABNORMAL HIGH (ref 0.00–0.07)
Basophils Absolute: 0 K/uL (ref 0.0–0.1)
Basophils Relative: 0 %
Eosinophils Absolute: 0 K/uL (ref 0.0–0.5)
Eosinophils Relative: 0 %
HCT: 23.3 % — ABNORMAL LOW (ref 39.0–52.0)
Hemoglobin: 6.8 g/dL — CL (ref 13.0–17.0)
Immature Granulocytes: 2 %
Lymphocytes Relative: 9 %
Lymphs Abs: 0.9 K/uL (ref 0.7–4.0)
MCH: 22.9 pg — ABNORMAL LOW (ref 26.0–34.0)
MCHC: 29.2 g/dL — ABNORMAL LOW (ref 30.0–36.0)
MCV: 78.5 fL — ABNORMAL LOW (ref 80.0–100.0)
Monocytes Absolute: 0.4 K/uL (ref 0.1–1.0)
Monocytes Relative: 4 %
Neutro Abs: 8.7 K/uL — ABNORMAL HIGH (ref 1.7–7.7)
Neutrophils Relative %: 85 %
Platelets: 171 K/uL (ref 150–400)
RBC: 2.97 MIL/uL — ABNORMAL LOW (ref 4.22–5.81)
RDW: 19.6 % — ABNORMAL HIGH (ref 11.5–15.5)
WBC: 10.2 K/uL (ref 4.0–10.5)
nRBC: 2.1 % — ABNORMAL HIGH (ref 0.0–0.2)

## 2024-03-27 LAB — I-STAT CHEM 8, ED
BUN: 34 mg/dL — ABNORMAL HIGH (ref 8–23)
Calcium, Ion: 1.09 mmol/L — ABNORMAL LOW (ref 1.15–1.40)
Chloride: 96 mmol/L — ABNORMAL LOW (ref 98–111)
Creatinine, Ser: 2.1 mg/dL — ABNORMAL HIGH (ref 0.61–1.24)
Glucose, Bld: 96 mg/dL (ref 70–99)
HCT: 22 % — ABNORMAL LOW (ref 39.0–52.0)
Hemoglobin: 7.5 g/dL — ABNORMAL LOW (ref 13.0–17.0)
Potassium: 3.8 mmol/L (ref 3.5–5.1)
Sodium: 130 mmol/L — ABNORMAL LOW (ref 135–145)
TCO2: 11 mmol/L — ABNORMAL LOW (ref 22–32)

## 2024-03-27 LAB — PREPARE RBC (CROSSMATCH)

## 2024-03-27 MED ORDER — DEXAMETHASONE SOD PHOSPHATE PF 10 MG/ML IJ SOLN
8.0000 mg | Freq: Once | INTRAMUSCULAR | Status: AC
  Administered 2024-03-27: 8 mg via INTRAVENOUS

## 2024-03-27 MED ORDER — OXYCODONE HCL ER 15 MG PO T12A
30.0000 mg | EXTENDED_RELEASE_TABLET | Freq: Three times a day (TID) | ORAL | Status: DC
  Administered 2024-03-27 – 2024-03-28 (×3): 30 mg via ORAL
  Filled 2024-03-27 (×3): qty 2

## 2024-03-27 MED ORDER — BIOTENE DRY MOUTH MT LIQD: 15.0000 mL | OROMUCOSAL | Status: DC | PRN

## 2024-03-27 MED ORDER — TRAZODONE HCL 50 MG PO TABS
50.0000 mg | ORAL_TABLET | Freq: Every day | ORAL | Status: DC
  Administered 2024-03-27: 50 mg via ORAL
  Filled 2024-03-27: qty 1

## 2024-03-27 MED ORDER — ONDANSETRON HCL 4 MG/2ML IJ SOLN
4.0000 mg | Freq: Four times a day (QID) | INTRAMUSCULAR | Status: DC | PRN
  Administered 2024-03-27 – 2024-03-28 (×2): 4 mg via INTRAVENOUS
  Filled 2024-03-27 (×2): qty 2

## 2024-03-27 MED ORDER — SENNA 8.6 MG PO TABS
1.0000 | ORAL_TABLET | Freq: Every day | ORAL | Status: DC
  Administered 2024-03-27 – 2024-03-28 (×2): 8.6 mg via ORAL
  Filled 2024-03-27 (×2): qty 1

## 2024-03-27 MED ORDER — HYDROMORPHONE HCL 1 MG/ML IJ SOLN
0.5000 mg | Freq: Once | INTRAMUSCULAR | Status: AC
  Administered 2024-03-27: 0.5 mg via INTRAVENOUS
  Filled 2024-03-27: qty 1

## 2024-03-27 MED ORDER — ACETAMINOPHEN 325 MG PO TABS: 650.0000 mg | ORAL_TABLET | Freq: Four times a day (QID) | ORAL | Status: DC | PRN

## 2024-03-27 MED ORDER — POLYETHYLENE GLYCOL 3350 17 G PO PACK
17.0000 g | PACK | Freq: Every day | ORAL | Status: DC
  Administered 2024-03-27: 17 g via ORAL
  Filled 2024-03-27 (×2): qty 1

## 2024-03-27 MED ORDER — POLYVINYL ALCOHOL 1.4 % OP SOLN: 1.0000 [drp] | Freq: Four times a day (QID) | OPHTHALMIC | Status: DC | PRN

## 2024-03-27 MED ORDER — HYDROMORPHONE HCL 1 MG/ML IJ SOLN
0.5000 mg | INTRAMUSCULAR | Status: DC | PRN
  Administered 2024-03-27 – 2024-03-28 (×6): 1 mg via INTRAVENOUS
  Filled 2024-03-27 (×6): qty 1

## 2024-03-27 MED ORDER — ACETAMINOPHEN 650 MG RE SUPP: 650.0000 mg | Freq: Four times a day (QID) | RECTAL | Status: DC | PRN

## 2024-03-27 MED ORDER — OXYCODONE HCL 5 MG PO TABS
20.0000 mg | ORAL_TABLET | ORAL | Status: DC | PRN
  Administered 2024-03-27: 20 mg via ORAL
  Filled 2024-03-27: qty 4

## 2024-03-27 MED ORDER — PANTOPRAZOLE SODIUM 40 MG IV SOLR
40.0000 mg | Freq: Once | INTRAVENOUS | Status: AC
  Administered 2024-03-27: 40 mg via INTRAVENOUS
  Filled 2024-03-27: qty 10

## 2024-03-27 MED ORDER — SODIUM CHLORIDE 0.9% IV SOLUTION: Freq: Once | INTRAVENOUS | Status: AC

## 2024-03-27 MED ORDER — ONDANSETRON 4 MG PO TBDP: 4.0000 mg | ORAL_TABLET | Freq: Four times a day (QID) | ORAL | Status: DC | PRN

## 2024-03-27 MED ORDER — METHADONE HCL 10 MG PO TABS
5.0000 mg | ORAL_TABLET | Freq: Three times a day (TID) | ORAL | Status: DC
  Administered 2024-03-27 – 2024-03-28 (×3): 5 mg via ORAL
  Filled 2024-03-27 (×4): qty 1

## 2024-03-27 MED ORDER — SODIUM CHLORIDE 0.9 % IV BOLUS
2000.0000 mL | Freq: Once | INTRAVENOUS | Status: AC
  Administered 2024-03-27: 2000 mL via INTRAVENOUS
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
## 2024-03-27 NOTE — Consult Note (Addendum)
 Consultation Note Date: 03/27/2024   Patient Name: Troy Mclaughlin  DOB: 08-07-1955  MRN: [id number]  Age / Sex: 68 y.o., male  PCP: Oley Bascom RAMAN, NP Referring Physician: Suzette Pac, MD  Reason for Consultation: Establishing goals of care  HPI/Patient Profile: 68 y.o. male  with past medical history of prostate cancer with metastasis, cancer related pain, DM-2, HTN, gastric ulcer, EtOH gastritis and BPH admitted on 03/27/2024 with abdominal pain and vomiting.   During evaluation in the ED, he was found to be anemic to 6.8 and hypotensive to 57/12 with oxygen saturation of 79% on 15 L by HFNC in ED. he was stabilized with some IV fluids.  Transfusion of blood was pending at time of my evaluation.  Discussed with ED physician and hospitalist.  PMT has been consulted to assist with goals of care conversation/comfort care.  Clinical Assessment and Goals of Care:  I have reviewed medical records including EPIC notes, labs and imaging, discussed with RN, assessed the patient and then had a phone conversation with patient's wife to discuss diagnosis prognosis, GOC, EOL wishes, disposition and options.  I introduced Palliative Medicine as specialized medical care for people living with serious illness. It focuses on providing relief from the symptoms and stress of a serious illness. The goal is to improve quality of life for both the patient and the family.  We discussed a brief life review of the patient and then focused on their current illness.  The natural disease trajectory and expectations at EOL were discussed.  I attempted to elicit values and goals of care important to the patient.    Medical History Review and Understanding:  Reviewed patient's illness with him and his wife by phone afterwards.  They both understand that patient is dying.  Social History: Patient is from home with his wife.  Previously seen by outpatient palliative care clinic and  then referred to hospice upon discharge on 01/09/2024.  Functional and Nutritional State: Wife reports he lays around all day and does not eat very much at all  Palliative Symptoms: Patient reports 10 out of 10 pain, mostly around his waist and sometimes radiating to his knees.  At the best he can get down to a 7 out of 10  Code Status: Concepts specific to code status, artifical feeding and hydration, and rehospitalization were considered and discussed.   Discussion: Patient is very frustrated by lack of timely response to his request for pain medicines since his arrival around 2 AM.  He is also very adamant that this is all he is here for, does not want other interventions but rather just wants a comfortable bed upstairs and better pain control.  He understands that he is in so much pain because he is dying from his cancer. Called patient's wife to explore her thoughts and feelings.  She confirms her understanding of patient's terminal diagnosis and hopes for improved symptom control.  She also hopes that he will not lay around so much when he returns home.  I explained that for the progressive nature of his cancer that increased functioning or energy is not expected at this stage of his illness.  He is also not expected to have a good appetite due to his overall disease burden.  I clearly explained that although if he does make sure he is comfortable as his natural trajectory plays out.  She verbalized her understanding.  She also confirmed that the hope would to be taken back home once pain has  improved.   The difference between aggressive medical intervention and comfort care was considered in light of the patient's goals of care. Hospice and Palliative Care services outpatient were explained and offered.   Discussed the importance of continued conversation with family and the medical providers regarding overall plan of care and treatment options, ensuring decisions are within the context  of the patient's values and GOCs.   Questions and concerns were addressed.  Hard Choices booklet left for review. The family was encouraged to call with questions or concerns.  PMT will continue to support holistically.   SUMMARY OF RECOMMENDATIONS   - Continue DNR/DNI -Full comfort focused care with titration of pain medication and goal of returning home with hospice once pain has improved Continue home methadone  5 mg PO TID Continue home oxycodone  20 mg PO Q4H PRN Continue home seroquel 50 mg at bedtime Continue home senna for constipation Give loading dose of Decadron  8mg  IV for bony metastases pain Dilaudid  PRN for pain/air hunger/comfort Ativan  PRN for agitation/anxiety Zofran  PRN for nausea Liquifilm tears PRN for dry eyes May have comfort feeding Unrestricted visitations in the setting of EOL (per policy) Oxygen PRN 2L or less for comfort. No escalation.  -Psychosocial and emotional support provided -PMT will continue to follow and support  Prognosis:  Very poor prognosis, approaching end-of-life  Discharge Planning: Home with Hospice      Primary Diagnoses: Present on Admission: **None** End of life care  Physical Exam Vitals and nursing note reviewed.  Constitutional:      General: He is not in acute distress.    Appearance: He is ill-appearing.  HENT:     Head: Normocephalic and atraumatic.  Cardiovascular:     Rate and Rhythm: Tachycardia present.  Pulmonary:     Effort: Pulmonary effort is normal.  Skin:    General: Skin is warm and dry.  Neurological:     Mental Status: He is alert.     Comments: Oriented to person and place  Psychiatric:        Behavior: Behavior normal.        Thought Content: Thought content normal.     Vital Signs: BP (!) 57/12 (BP Location: Left Arm)   Pulse (!) 118   SpO2 (!) 79%  Pain Scale: 0-10   Pain Score: 8    SpO2: SpO2: (!) 79 % O2 Device:SpO2: (!) 79 % O2 Flow Rate: .O2 Flow Rate (L/min): 15  L/min    Tray Klayman P Deshan Hemmelgarn, PA-C  Palliative Medicine Team Team phone # [phone]  Thank you for allowing the Palliative Medicine Team to assist in the care of this patient. Please utilize secure chat with additional questions, if there is no response within 30 minutes please call the above phone number.  Palliative Medicine Team providers are available by phone from 7am to 7pm daily and can be reached through the team cell phone.  Should this patient require assistance outside of these hours, please call the patient's attending physician.   Billing based on MDM: high  Problems Addressed: One acute or chronic illness or injury that poses a threat to life or bodily function  Amount and/or Complexity of Data: Category 1:Review of prior external note(s) from each unique source, Review of the result(s) of each unique test, and Assessment requiring an independent historian(s), Category 2:Independent interpretation of a test performed by another physician/other qualified health care professional (not separately reported), and Category 3:Discussion of management or test interpretation with external physician/other qualified health care  professional/appropriate source (not separately reported)  Risks: Parenteral controlled substances and Decision not to resuscitate or to de-escalate care because of poor prognosis

## 2024-03-27 NOTE — ED Provider Notes (Signed)
 [facility]ERGENC[facility] DEPARTMENT AT [facility] Provider Note   CSN: [id number] Arrival date & time: 03/27/24  9252     Patient presents with: Abdominal Pain   Troy Mclaughlin is a 68 y.o. male.  {Add pertinent medical, surgical, social history, OB history to YEP:67052} Patient has metastatic prostate cancer.  He was sent to the emergency department by his wife because he has been vomiting and complaining of pain.  Patient was hypotensive and hypoxic when he arrived.  He was placed on oxygen and given some IV fluids and basic labs were drawn.  Patient improved with a liter of IV fluids.  The history is provided by the patient and a friend.  Abdominal Pain Pain location:  Generalized Pain quality: aching   Pain radiates to:  Does not radiate Pain severity:  Moderate Onset quality:  Sudden Timing:  Constant Progression:  Worsening Chronicity:  New Context: not alcohol  use   Relieved by:  Nothing Associated symptoms: vomiting   Associated symptoms: no chest pain, no cough, no diarrhea, no fatigue and no hematuria        Prior to Admission medications   Medication Sig Start Date End Date Taking? Authorizing Provider  acetaminophen  (TYLENOL ) 500 MG tablet Take 2 tablets (1,000 mg total) by mouth every 6 (six) hours as needed for moderate pain or mild pain. 05/05/21   Nivia Colon, PA-C  albuterol  (VENTOLIN  HFA) 108 (90 Base) MCG/ACT inhaler Inhale 1-2 puffs into the lungs every 6 (six) hours as needed for wheezing or shortness of breath. 08/21/23   Pickenpack-Cousar, Athena N, NP  amLODipine  (NORVASC ) 10 MG tablet Take 1 tablet (10 mg total) by mouth every morning. 10/25/23   Tina Pauletta BROCKS, MD  amLODipine  (NORVASC ) 10 MG tablet Take 1 tablet (10 mg total) by mouth daily. 02/22/24     amLODipine  (NORVASC ) 10 MG tablet Take 1 tablet (10 mg total) by mouth daily. 03/07/24     bisacodyl  (DULCOLAX) 5 MG EC tablet Take 1 tablet (5 mg total) by mouth daily as needed for moderate  constipation. 12/19/23   Dorinda Drue DASEN, MD  carvedilol  (COREG ) 12.5 MG tablet Take 1 tablet (12.5 mg total) by mouth 2 (two) times daily with a meal. 10/25/23   Tina Pauletta BROCKS, MD  carvedilol  (COREG ) 12.5 MG tablet Take 1 tablet by mouth once a day 02/22/24     carvedilol  (COREG ) 12.5 MG tablet Take 1 tablet by mouth once a day 03/07/24     cyanocobalamin  (VITAMIN B12) 1000 MCG tablet Take 1 tablet (1,000 mcg total) by mouth daily. Patient not taking: Reported on 01/05/2024 05/22/23   Tina Pauletta BROCKS, MD  darolutamide  (NUBEQA ) 300 MG tablet Take 2 tablets (600 mg total) by mouth 2 (two) times daily with a meal. Patient taking differently: Take 300 mg by mouth daily. 06/19/23   Tina Pauletta BROCKS, MD  dronabinol  (MARINOL ) 10 MG capsule Take 1 capsule (10 mg total) by mouth 2 (two) times daily before a meal. Patient not taking: Reported on 01/05/2024 10/25/23   Pickenpack-Cousar, Fannie SAILOR, NP  feeding supplement (ENSURE PLUS HIGH PROTEIN) LIQD Take 237 mLs by mouth 2 (two) times daily between meals. 12/20/23   Dorinda Drue DASEN, MD  ferrous sulfate  325 (65 FE) MG tablet Take 1 tablet (325 mg total) by mouth daily before breakfast. 12/20/23   Dorinda Drue DASEN, MD  methadone  (DOLOPHINE ) 5 MG tablet Take 1.5 tablets (7.5 mg total) by mouth 3 (three) times daily for pain.  03/07/24     metoCLOPramide  (REGLAN ) 5 MG tablet Take 1 tablet by mouth three times a day. Take one tablet 30 minutes before each meal. 03/07/24     mirtazapine  (REMERON ) 15 MG tablet Take 1 tablet (15 mg total) by mouth every evening with dinner. 02/22/24     nicotine  (NICODERM CQ  - DOSED IN MG/24 HOURS) 14 mg/24hr patch Place 1 patch (14 mg total) onto the skin daily. 01/09/24   Sebastian Toribio GAILS, MD  nitroGLYCERIN  (NITROSTAT ) 0.4 MG SL tablet Place 1 tablet under the tongue every 15 minutes up to 3 doses within 15 minutes as needed for chest pain 08/04/23   Oley Bascom RAMAN, NP  ondansetron  (ZOFRAN ) 4 MG tablet Take 1 tablet (4 mg total) by mouth every  8 (eight) hours as needed for nausea 10/25/23   Pickenpack-Cousar, Fannie SAILOR, NP  oxyCODONE  ER (XTAMPZA  ER) 13.5 MG C12A Take 1 capsule (13.5 mg) by mouth every 12 (twelve) hours. 01/09/24   Sebastian Toribio GAILS, MD  Oxycodone  HCl 20 MG TABS Take 1 tablet (20 mg total) by mouth every 4 (four) hours as needed for pain. 01/23/24     Oxycodone  HCl 20 MG TABS Take 1 tablet by mouth every 4 hours as needed for pain 01/25/24     Oxycodone  HCl 20 MG TABS Take 1 tablet (20 mg total) by mouth every 4 (four) hours as needed for pain. 02/06/24     Oxycodone  HCl 20 MG TABS Take 1 tablet by mouth every 4 hours as needed for pain 02/19/24     Oxycodone  HCl 20 MG TABS Take 1 tablet by mouth every four hours as needed for pain. 03/07/24     Oxycodone  HCl 20 MG TABS Take 1 tablet (20 mg total) by mouth every 4 (four) hours as needed for pain 03/21/24     pantoprazole  (PROTONIX ) 40 MG tablet Take 1 tablet (40 mg total) by mouth 2 (two) times daily before a meal. 01/09/24   Sebastian Toribio GAILS, MD  pantoprazole  (PROTONIX ) 40 MG tablet Take 1 tablet by mouth twice a day 02/22/24     polyethylene glycol powder (GLYCOLAX /MIRALAX ) 17 GM/SCOOP powder Take 17 grams dissolved in liquid by mouth daily. 01/10/24   Sebastian Toribio GAILS, MD  predniSONE (DELTASONE) 10 MG tablet Take 1 tablet by mouth once a day 02/22/24     predniSONE (DELTASONE) 10 MG tablet Take 1 tablet (10 mg total) by mouth daily. 03/07/24     prochlorperazine  (COMPAZINE ) 10 MG tablet Take 1 tablet by mouth every four hours as needed Tablet may also be inserted rectally or crushed and placed under the tongue 03/07/24     senna (SENOKOT) 8.6 MG tablet Take 2 tablets (17.2 mg total) by mouth 2 (two) times daily. 03/07/24     senna (SENOKOT) 8.6 MG TABS tablet Take 1 tablet (8.6 mg total) by mouth daily. 01/11/24   Hertweck, Nancyann, MD  senna-docusate (SENOKOT-S) 8.6-50 MG tablet Take 1 tablet by mouth 2 (two) times daily. Patient taking differently: Take 1 tablet by mouth 2 (two)  times daily as needed for moderate constipation. 07/19/23   Jillian Buttery, MD  traZODone  (DESYREL ) 50 MG tablet Take 1 tablet (50 mg total) by mouth at bedtime. Patient taking differently: Take 50 mg by mouth at bedtime as needed for sleep. 09/06/23   Oley Bascom RAMAN, NP  traZODone  (DESYREL ) 50 MG tablet Take 1 tablet (50 mg total) by mouth at bedtime. 03/07/24     atorvastatin  (LIPITOR)  10 MG tablet Take 1 tablet (10 mg total) by mouth every morning. 10/25/23 01/11/24  Tina Pauletta BROCKS, MD    Allergies: Lisinopril  and Metformin  and related    Review of Systems  Constitutional:  Negative for appetite change and fatigue.  HENT:  Negative for congestion, ear discharge and sinus pressure.   Eyes:  Negative for discharge.  Respiratory:  Negative for cough.   Cardiovascular:  Negative for chest pain.  Gastrointestinal:  Positive for abdominal pain and vomiting. Negative for diarrhea.  Genitourinary:  Negative for frequency and hematuria.  Musculoskeletal:  Negative for back pain.  Skin:  Negative for rash.  Neurological:  Negative for seizures and headaches.  Psychiatric/Behavioral:  Negative for hallucinations.     Updated Vital Signs BP (!) 57/12 (BP Location: Left Arm)   Pulse (!) 118   SpO2 (!) 79%   Physical Exam Vitals reviewed.  Constitutional:      Appearance: He is well-developed.     Comments: Lethargic  HENT:     Head: Normocephalic.     Nose: Nose normal.     Mouth/Throat:     Mouth: Mucous membranes are dry.  Eyes:     General: No scleral icterus.    Conjunctiva/sclera: Conjunctivae normal.  Neck:     Thyroid : No thyromegaly.  Cardiovascular:     Rate and Rhythm: Regular rhythm. Tachycardia present.     Heart sounds: No murmur heard.    No friction rub. No gallop.  Pulmonary:     Breath sounds: No stridor. No wheezing or rales.     Comments: Decreased respiratory effort Chest:     Chest wall: No tenderness.  Abdominal:     General: There is no distension.      Tenderness: There is abdominal tenderness. There is no rebound.  Musculoskeletal:        General: Normal range of motion.     Cervical back: Neck supple.  Lymphadenopathy:     Cervical: No cervical adenopathy.  Skin:    Findings: No erythema or rash.  Neurological:     Motor: No abnormal muscle tone.     Coordination: Coordination normal.     Comments: Patient was only responding to painful stimuli initially     (all labs ordered are listed, but only abnormal results are displayed) Labs Reviewed  CBC WITH DIFFERENTIAL/PLATELET - Abnormal; Notable for the following components:      Result Value   RBC 2.97 (*)    Hemoglobin 6.8 (*)    HCT 23.3 (*)    MCV 78.5 (*)    MCH 22.9 (*)    MCHC 29.2 (*)    RDW 19.6 (*)    nRBC 2.1 (*)    Neutro Abs 8.7 (*)    Abs Immature Granulocytes 0.21 (*)    All other components within normal limits  COMPREHENSIVE METABOLIC PANEL WITH GFR - Abnormal; Notable for the following components:   Chloride 88 (*)    CO2 7 (*)    Glucose, Bld 102 (*)    BUN 34 (*)    Creatinine, Ser 2.14 (*)    AST 48 (*)    Alkaline Phosphatase 1,266 (*)    GFR, Estimated 33 (*)    Anion gap 40 (*)    All other components within normal limits  I-STAT CHEM 8, ED - Abnormal; Notable for the following components:   Sodium 130 (*)    Chloride 96 (*)    BUN 34 (*)  Creatinine, Ser 2.10 (*)    Calcium , Ion 1.09 (*)    TCO2 11 (*)    Hemoglobin 7.5 (*)    HCT 22.0 (*)    All other components within normal limits  TYPE AND SCREEN  PREPARE RBC (CROSSMATCH)    EKG: None  Radiology: CT ABDOMEN PELVIS WO CONTRAST Result Date: 03/27/2024 CLINICAL DATA:  Abdominal pain. EXAM: CT ABDOMEN AND PELVIS WITHOUT CONTRAST TECHNIQUE: Multidetector CT imaging of the abdomen and pelvis was performed following the standard protocol without IV contrast. RADIATION DOSE REDUCTION: This exam was performed according to the departmental dose-optimization program which includes  automated exposure control, adjustment of the mA and/or kV according to patient size and/or use of iterative reconstruction technique. COMPARISON:  01/04/2024 FINDINGS: Lower chest: Elevated left hemidiaphragm slightly worse. Heart is normal size. Visualized lung bases are clear. Minimal centrilobular emphysematous disease in within the lung bases. Hepatobiliary: Several small liver hypodensities too small to characterize but likely cysts and unchanged. Gallbladder and biliary tree are normal. Pancreas: Normal. Spleen: Normal. Adrenals/Urinary Tract: Region of the left adrenal gland is normal. Right adrenal gland is normal. Kidneys are normal in size 6 mm stone over the mid to lower pole left kidney unchanged. No hydronephrosis. Ureters and bladder are normal. Stomach/Bowel: Surgical clips over the gastroesophageal junction and gastric antrum. Surgical clips over the left upper quadrant. Small bowel is unremarkable. Surgical clips over the distal transverse colon in the left abdomen. Remainder of the colon is unremarkable. Appendix is normal. Vascular/Lymphatic: Moderate calcified plaque over the abdominal aorta which is normal in caliber. No adenopathy. Reproductive: Fiducial markers over the prostate. Other: No free fluid or focal inflammatory change. Surgical clips over the anterior abdominal wall. Musculoskeletal: Extensive sclerotic metastases throughout the axial and appendicular skeleton without significant change. IMPRESSION: 1. No acute findings in the abdomen/pelvis. 2. 6 mm nonobstructing left renal stone unchanged. 3. Several small liver hypodensities too small to characterize, but likely cysts and unchanged. 4. Extensive sclerotic metastases throughout the axial and appendicular skeleton without significant change compatible patient's metastatic prostate cancer. 5. Aortic atherosclerosis. 6. Emphysema. Aortic Atherosclerosis (ICD10-I70.0) and Emphysema (ICD10-J43.9). Electronically Signed   By: Toribio Agreste M.D.   On: 03/27/2024 10:29   DG Chest Port 1 View Result Date: 03/27/2024 EXAM: 1 VIEW(S) XRAY OF THE CHEST 03/27/2024 09:44:00 AM COMPARISON: 01/04/2024 x-ray. Chest CT 01/04/2024. CLINICAL HISTORY: 68 year old male. Shortness of breath. FINDINGS: LUNGS AND PLEURA: Stable lung volumes. Streaky and confluent left lung opacity persists, superimposed on chronic bronchiectasis there by chest CT 01/04/2024. No acute pulmonary opacity. No pulmonary edema. No pleural effusion. No pneumothorax. HEART AND MEDIASTINUM: Stable mediastinal contours. No acute abnormality of the cardiac silhouette. BONES AND SOFT TISSUES: Diffuse sclerotic osseous metastatic disease. Stable upper abdominal surgical clips. IMPRESSION: 1. No acute cardiopulmonary abnormality. 2. Diffuse sclerotic osseous metastatic disease. 3. Chronic lung disease with stable streaky and confluent left lung opacity, superimposed on bronchiectasis. Electronically signed by: Helayne Hurst MD 03/27/2024 09:52 AM EST RP Workstation: [id number]    {Document cardiac monitor, telemetry assessment procedure when appropriate:32947} Procedures   Medications Ordered in the ED  sodium chloride  0.9 % bolus 2,000 mL (2,000 mLs Intravenous New Bag/Given 03/27/24 0845)  HYDROmorphone  (DILAUDID ) injection 0.5 mg (0.5 mg Intravenous Given 03/27/24 0920)  pantoprazole  (PROTONIX ) injection 40 mg (40 mg Intravenous Given 03/27/24 1012)  0.9 %  sodium chloride  infusion (Manually program via Guardrails IV Fluids) ( Intravenous New Bag/Given 03/27/24 1015)  HYDROmorphone  (DILAUDID ) injection  0.5 mg (0.5 mg Intravenous Given 03/27/24 1255)   CRITICAL CARE Performed by: Fairy Sermon Total critical care time: 120 minutes Critical care time was exclusive of separately billable procedures and treating other patients. Critical care was necessary to treat or prevent imminent or life-threatening deterioration. Critical care was time spent personally by me on the following  activities: development of treatment plan with patient and/or surrogate as well as nursing, discussions with consultants, evaluation of patient's response to treatment, examination of patient, obtaining history from patient or surrogate, ordering and performing treatments and interventions, ordering and review of laboratory studies, ordering and review of radiographic studies, pulse oximetry and re-evaluation of patient's condition.      Patient was hypotensive and lethargic and having  decreased respiratory effort initially.  We started some IV fluids and check some basic labs.  I was able to get in touch with his oncologist who told me the patient was a DNR and comfort care only.  Eventually were able to get in touch with his wife who said hospice comes twice a week and she sent him to the emergency department because he was vomiting and having abdominal pain.  After patient received IV fluids he was awake and alert complaining of abdominal discomfort and wanting pain medicines.  I told him that he was dehydrated and he was anemic and asked him if he would like to get some IV fluids and a unit of blood.  The patient stated that he does not want this care.  I spoke to the hospitalist about admitting him to's to get more IV fluids and pain control and 1 unit of blood.  The hospitalist Dr.Gonfa wanted a palliative care consult prior to seeing and admitting the patient.  I spoke with palliative care and they will consult before 5:00 today {Click here for ABCD2, HEART and other calculators REFRESH Note before signing:1}                              Medical Decision Making Amount and/or Complexity of Data Reviewed Labs: ordered. Radiology: ordered. ECG/medicine tests: ordered.  Risk Prescription drug management.  Metastatic prostate cancer with severe AKI and anemia and chronic pain.  Patient getting IV fluids and 1 unit of packed red blood cells and will be consulted on by palliative care and most  likely admitted to observation by the hospitalist  {Document critical care time when appropriate  Document review of labs and clinical decision tools ie [id number], etc  Document your independent review of radiology images and any outside records  Document your discussion with family members, caretakers and with consultants  Document social determinants of health affecting pt's care  Document your decision making why or why not admission, treatments were needed:32947:::1}   Final diagnoses:  None    ED Discharge Orders     None

## 2024-03-27 NOTE — ED Provider Notes (Signed)
 The patient has metastatic prostate cancer.  I spoke with his oncologist Dr. Tina and I spoke with his wife.  Patient is a DNR and comfort care.   Suzette Pac, MD 03/27/24 1108

## 2024-03-27 NOTE — Progress Notes (Signed)
 [facility] [employer] liaison note     This patient is a current hospice patient with [facility]. Please see media tab for detailed hospice report.    Liaison will continue to follow for any discharge planning needs and to coordinate continuation of hospice care.    Please don't hesitate to call with any Hospice related questions or concerns.    Thank you for the opportunity to participate in this patient's care.  Greig Basket, BSN, RN [facility] Liaison [phone]

## 2024-03-27 NOTE — ED Triage Notes (Signed)
 Patient arrives by EMS for abdominal pain, very emanciated.  Pt hx cancer.

## 2024-03-27 NOTE — ED Notes (Signed)
 Patient called out wanting pain meds. Nurse notified.

## 2024-03-28 DIAGNOSIS — R52 Pain, unspecified: Secondary | ICD-10-CM

## 2024-03-28 DIAGNOSIS — Z66 Do not resuscitate: Secondary | ICD-10-CM

## 2024-03-28 DIAGNOSIS — R059 Cough, unspecified: Secondary | ICD-10-CM

## 2024-03-28 DIAGNOSIS — Z789 Other specified health status: Secondary | ICD-10-CM

## 2024-03-28 DIAGNOSIS — Z515 Encounter for palliative care: Secondary | ICD-10-CM

## 2024-03-28 DIAGNOSIS — C61 Malignant neoplasm of prostate: Secondary | ICD-10-CM | POA: Diagnosis not present

## 2024-03-28 DIAGNOSIS — Z7189 Other specified counseling: Secondary | ICD-10-CM

## 2024-03-28 DIAGNOSIS — C7951 Secondary malignant neoplasm of bone: Secondary | ICD-10-CM

## 2024-03-28 MED ORDER — OXYCODONE HCL ER 30 MG PO T12A: 30.0000 mg | EXTENDED_RELEASE_TABLET | Freq: Three times a day (TID) | ORAL | Status: DC

## 2024-03-28 MED ORDER — LORAZEPAM 1 MG PO TABS: 1.0000 mg | ORAL_TABLET | ORAL | Status: DC | PRN

## 2024-03-28 MED ORDER — DEXAMETHASONE 1 MG/ML PO CONC
4.0000 mg | Freq: Every day | ORAL | Status: DC
  Filled 2024-03-28: qty 4

## 2024-03-28 MED ORDER — GUAIFENESIN 100 MG/5ML PO LIQD: 5.0000 mL | ORAL | Status: DC | PRN

## 2024-03-28 MED ORDER — DEXAMETHASONE 10 MG/ML FOR PEDIATRIC ORAL USE: 4.0000 mg | Freq: Every day | INTRAMUSCULAR | Status: DC

## 2024-03-28 MED ORDER — OXYCODONE HCL 20 MG PO TABS: 20.0000 mg | ORAL_TABLET | ORAL | Status: DC | PRN

## 2024-03-28 MED ORDER — METHADONE HCL 5 MG PO TABS: 5.0000 mg | ORAL_TABLET | Freq: Three times a day (TID) | ORAL | Status: DC

## 2024-03-28 MED ORDER — DEXAMETHASONE 10 MG/ML FOR PEDIATRIC ORAL USE
4.0000 mg | Freq: Every day | INTRAMUSCULAR | Status: DC
  Filled 2024-03-28: qty 0.4

## 2024-03-28 MED ORDER — GLYCOPYRROLATE 0.2 MG/ML IJ SOLN
0.2000 mg | INTRAMUSCULAR | Status: DC | PRN
  Administered 2024-03-28: 0.2 mg via INTRAVENOUS
  Filled 2024-03-28: qty 1

## 2024-03-28 NOTE — Plan of Care (Signed)
  Problem: Respiratory: Goal: Verbalizations of increased ease of respirations will increase Outcome: Progressing   Problem: Pain Management: Goal: Satisfaction with pain management regimen will improve Outcome: Progressing   Problem: Coping: Goal: Level of anxiety will decrease Outcome: Progressing   Problem: Elimination: Goal: Will not experience complications related to urinary retention Outcome: Progressing   Problem: Pain Managment: Goal: General experience of comfort will improve and/or be controlled Outcome: Progressing   Problem: Safety: Goal: Ability to remain free from injury will improve Outcome: Progressing

## 2024-03-28 NOTE — Discharge Summary (Signed)
 Physician Discharge Summary  [name] FMW:[id number] DOB: 1956-04-15 DOA: 03/27/2024  PCP: Oley Bascom RAMAN, NP  Admit date: 03/27/2024 Discharge date: 03/28/2024  Admitted From: Home Disposition: [facility] residential hospice  Recommendations for Outpatient Follow-up:  Follow up hospice provider at facility   Discharge Condition: Guarded CODE STATUS: DNR Diet recommendation: Comfort feeds as tolerates  History of present illness:  Troy Mclaughlin is a 68 y.o. male with past medical history significant for prostate cancer with metastasis to bone, DM2, HTN, BPH, history of gastric ulcer, EtOH gastritis who is currently followed by hospice at home who presented to [facility] ED on 03/27/2024 via EMS for abdominal pain.  Patient attributes pain all over to his cancer.  Asking for pain medications.  Denies shortness of breath, nausea or vomiting.   Patient currently on full comfort care with hospice at home which was confirmed with patient's oncologist and spouse by EDP.   In the ED, temperature 97.6 F, HR 118, RR 16, BP 57/12, SpO2 79% on nasal cannula.  WBC 10.2, hemoglobin 6.8, platelet count 171.  Sodium 135, potassium 4.1, chloride 88, CO2 7, glucose 102, BUN 34, creatinine 2.14.  AST 48, ALT 7, total bilirubin 0.5.  Chest x-ray with no acute cardiopulmonary abnormality, diffuse sclerotic osseous metastasis disease, chronic lung disease with stable streaky and confluent left lung opacity superimposed on bronchiectasis.  CT abdomen/pelvis without contrast with no acute findings in abdomen/pelvis, 6 mm nonobstructing left renal stone unchanged, several small liver hypodensities too small to characterize but likely cysts and unchanged, extensive sclerotic metastasis throughout the axial and appendicular skeleton without significant change compatible with patient's metastatic prostate cancer.  Patient received IV fluid bolus and blood transfusion with stabilization of vital signs.  TRH  consulted for admission for further evaluation and management of intractable pain of malignancy.  Hospital course:  Intractable pain of malignancy Prostate cancer with extensive metastasis to axial/appendicular skeleton End-of-life/full comfort care Adult failure to thrive Patient presenting with abdominal pain, diffuse pain of malignancy.  Palliative care was consulted to vaulter hospital course.  Started on dexamethasone  4 mg p.o. daily, OxyContin  30 m p.o. every 8 hours, methadone  5 mg p.o. 3 times daily, oxycodone  20 mg p.o. every 4 hours as needed for moderate pain.  Ativan  as needed for anxiety.  Seen by hospice, discharging to be completed residential hospice.   Anemia of chronic medical disease Hemoglobin 6.8 on admission, transfuse 1 unit PRBC with improvement of hemoglobin to 7.5.  Currently on hospice/full comfort measures.   DM2 Hemoglobin A1c 6.1 on 09/06/2023. Comfort feeds as tolerated   Hypotension Hx HTN Blood pressure on arrival 57/12, improved after IV fluid resuscitation and blood transfusion.  At baseline on amlodipine  10 mg p.o. daily, carvedilol  12.5 mg p.o. twice daily.  Discontinued home and hypertensives.  Discharging to residential hospice.   BPH Monitor urine output.  If concerns for any obstructive uropathy, plan Foley catheter placement for comfort.  Discharge Diagnoses:  Principal Problem:   Admission for end of life care    Discharge Instructions  Discharge Instructions     Diet - low sodium heart healthy   Complete by: As directed    Increase activity slowly   Complete by: As directed       Allergies as of 03/28/2024       Reactions   Lisinopril  Anaphylaxis, Swelling   angioedema   Metformin  And Related Other (See Comments)   unknown  Medication List     STOP taking these medications    amLODipine  10 MG tablet Commonly known as: NORVASC    B-12 1000 MCG Tabs   carvedilol  12.5 MG tablet Commonly known as: Coreg     dronabinol  10 MG capsule Commonly known as: Marinol    feeding supplement (OSMOLITE 1.2 CAL) Liqd   FeroSul 325 (65 Fe) MG tablet Generic drug: ferrous sulfate    metoCLOPramide  5 MG tablet Commonly known as: Reglan    mirtazapine  15 MG tablet Commonly known as: REMERON    nicotine  14 mg/24hr patch Commonly known as: NICODERM CQ  - dosed in mg/24 hours   nitroGLYCERIN  0.4 MG SL tablet Commonly known as: NITROSTAT    Nubeqa  300 MG tablet Generic drug: darolutamide    Oxycodone  HCl 20 MG Tabs Replaced by: oxyCODONE  30 MG 12 hr tablet You also have another medication with the same name that you need to continue taking as instructed.   pantoprazole  40 MG tablet Commonly known as: Protonix    predniSONE 10 MG tablet Commonly known as: DELTASONE   Stool Softener/Laxative 50-8.6 MG tablet Generic drug: senna-docusate   Xtampza  ER 13.5 MG C12a Generic drug: oxyCODONE  ER       TAKE these medications    acetaminophen  500 MG tablet Commonly known as: TYLENOL  Take 2 tablets (1,000 mg total) by mouth every 6 (six) hours as needed for moderate pain or mild pain.   bisacodyl  5 MG EC tablet Generic drug: bisacodyl  Take 1 tablet (5 mg total) by mouth daily as needed for moderate constipation.   dexamethasone  10 MG/ML Soln Commonly known as: DECADRON  Take 0.4 mLs (4 mg total) by mouth daily. Start taking on: March 29, 2024   LORazepam  1 MG tablet Commonly known as: ATIVAN  Take 1 tablet (1 mg total) by mouth every 4 (four) hours as needed for anxiety.   methadone  5 MG tablet Commonly known as: DOLOPHINE  Take 1 tablet (5 mg total) by mouth 3 (three) times daily. What changed: how much to take   ondansetron  4 MG tablet Commonly known as: ZOFRAN  Take 1 tablet (4 mg total) by mouth every 8 (eight) hours as needed for nausea   Oxycodone  HCl 20 MG Tabs Take 1 tablet (20 mg total) by mouth every 4 (four) hours as needed for moderate pain (pain score 4-6) (or dyspnea). What  changed:  reasons to take this Another medication with the same name was removed. Continue taking this medication, and follow the directions you see here.   oxyCODONE  30 MG 12 hr tablet Take 1 tablet (30 mg total) by mouth every 8 (eight) hours. What changed: You were already taking a medication with the same name, and this prescription was added. Make sure you understand how and when to take each. Replaces: Oxycodone  HCl 20 MG Tabs   polyethylene glycol powder 17 GM/SCOOP powder Commonly known as: GLYCOLAX /MIRALAX  Take 17 grams dissolved in liquid by mouth daily.   prochlorperazine  10 MG tablet Commonly known as: COMPAZINE  Take 1 tablet by mouth every four hours as needed Tablet may also be inserted rectally or crushed and placed under the tongue   senna 8.6 MG Tabs tablet Commonly known as: SENOKOT Take 2 tablets (17.2 mg total) by mouth 2 (two) times daily. What changed: Another medication with the same name was removed. Continue taking this medication, and follow the directions you see here.   traZODone  50 MG tablet Commonly known as: DESYREL  Take 1 tablet (50 mg total) by mouth at bedtime. What changed:  when to take this  reasons to take this Another medication with the same name was removed. Continue taking this medication, and follow the directions you see here.   Ventolin  HFA 108 (90 Base) MCG/ACT inhaler Generic drug: albuterol  Inhale 1-2 puffs into the lungs every 6 (six) hours as needed for wheezing or shortness of breath.        Follow-up Information     [facility] Follow up.   Specialty: Hospice and Palliative Medicine Contact information: [address] [facility] [place]  [address] [phone]               Allergies  Allergen Reactions   Lisinopril  Anaphylaxis and Swelling    angioedema   Metformin  And Related Other (See Comments)    unknown    Consultations: Palliative care   Procedures/Studies: CT ABDOMEN PELVIS WO  CONTRAST Result Date: 03/27/2024 CLINICAL DATA:  Abdominal pain. EXAM: CT ABDOMEN AND PELVIS WITHOUT CONTRAST TECHNIQUE: Multidetector CT imaging of the abdomen and pelvis was performed following the standard protocol without IV contrast. RADIATION DOSE REDUCTION: This exam was performed according to the departmental dose-optimization program which includes automated exposure control, adjustment of the mA and/or kV according to patient size and/or use of iterative reconstruction technique. COMPARISON:  01/04/2024 FINDINGS: Lower chest: Elevated left hemidiaphragm slightly worse. Heart is normal size. Visualized lung bases are clear. Minimal centrilobular emphysematous disease in within the lung bases. Hepatobiliary: Several small liver hypodensities too small to characterize but likely cysts and unchanged. Gallbladder and biliary tree are normal. Pancreas: Normal. Spleen: Normal. Adrenals/Urinary Tract: Region of the left adrenal gland is normal. Right adrenal gland is normal. Kidneys are normal in size 6 mm stone over the mid to lower pole left kidney unchanged. No hydronephrosis. Ureters and bladder are normal. Stomach/Bowel: Surgical clips over the gastroesophageal junction and gastric antrum. Surgical clips over the left upper quadrant. Small bowel is unremarkable. Surgical clips over the distal transverse colon in the left abdomen. Remainder of the colon is unremarkable. Appendix is normal. Vascular/Lymphatic: Moderate calcified plaque over the abdominal aorta which is normal in caliber. No adenopathy. Reproductive: Fiducial markers over the prostate. Other: No free fluid or focal inflammatory change. Surgical clips over the anterior abdominal wall. Musculoskeletal: Extensive sclerotic metastases throughout the axial and appendicular skeleton without significant change. IMPRESSION: 1. No acute findings in the abdomen/pelvis. 2. 6 mm nonobstructing left renal stone unchanged. 3. Several small liver  hypodensities too small to characterize, but likely cysts and unchanged. 4. Extensive sclerotic metastases throughout the axial and appendicular skeleton without significant change compatible patient's metastatic prostate cancer. 5. Aortic atherosclerosis. 6. Emphysema. Aortic Atherosclerosis (ICD10-I70.0) and Emphysema (ICD10-J43.9). Electronically Signed   By: Toribio Agreste M.D.   On: 03/27/2024 10:29   DG Chest Port 1 View Result Date: 03/27/2024 EXAM: 1 VIEW(S) XRAY OF THE CHEST 03/27/2024 09:44:00 AM COMPARISON: 01/04/2024 x-ray. Chest CT 01/04/2024. CLINICAL HISTORY: 68 year old male. Shortness of breath. FINDINGS: LUNGS AND PLEURA: Stable lung volumes. Streaky and confluent left lung opacity persists, superimposed on chronic bronchiectasis there by chest CT 01/04/2024. No acute pulmonary opacity. No pulmonary edema. No pleural effusion. No pneumothorax. HEART AND MEDIASTINUM: Stable mediastinal contours. No acute abnormality of the cardiac silhouette. BONES AND SOFT TISSUES: Diffuse sclerotic osseous metastatic disease. Stable upper abdominal surgical clips. IMPRESSION: 1. No acute cardiopulmonary abnormality. 2. Diffuse sclerotic osseous metastatic disease. 3. Chronic lung disease with stable streaky and confluent left lung opacity, superimposed on bronchiectasis. Electronically signed by: Helayne Hurst MD 03/27/2024 09:52 AM EST RP  Workstation: [id number]     Subjective: Patient seen examined bedside, lying in bed. Nephew present at bedside. Just received IV pain medication and about to fall asleep. Reports diffuse pain, improved with pain regimen. RN requesting transition to liquid form medications due to concerns of his ability to swallow. Seen by palliative care, recommending referral to residential hospice. Patient with no other questions or concerns at this time. Denies headache, no chest pain, no shortness of breath, no nausea/vomiting, no diarrhea, no fever. No acute concerns overnight per  nursing staff.  Discharging to residential hospice  Discharge Exam: Vitals:   03/27/24 1639 03/28/24 0557  BP: 133/86 (!) 129/96  Pulse: (!) 108 (!) 107  Resp: 16 18  Temp:  97.6 F (36.4 C)  SpO2: 100% 100%   Vitals:   03/27/24 0758 03/27/24 1618 03/27/24 1639 03/28/24 0557  BP: (!) 57/12 (!) 136/102 133/86 (!) 129/96  Pulse: (!) 118 (!) 106 (!) 108 (!) 107  Resp:  16 16 18   Temp:    97.6 F (36.4 C)  SpO2: (!) 79% 100% 100% 100%    Physical Exam: GEN: NAD, alert and oriented x 3, chronically ill, cachectic in appearance HEENT: NCAT, PERRL, EOMI, sclera clear, dry mucous membranes PULM: CTAB w/o wheezes/crackles, normal respiratory effort, on 4 L nasal cannula with SpO2 100% at rest CV: RRR  GI: abd soft, NTND, + BS MSK: no peripheral edema, moves all extremities independently    The results of significant diagnostics from this hospitalization (including imaging, microbiology, ancillary and laboratory) are listed below for reference.     Microbiology: No results found for this or any previous visit (from the past 240 hours).   Labs: BNP (last 3 results) No results for input(s): BNP in the last 8760 hours. Basic Metabolic Panel: Recent Labs  Lab 03/27/24 0843 03/27/24 0857  NA 135 130*  K 4.1 3.8  CL 88* 96*  CO2 7*  --   GLUCOSE 102* 96  BUN 34* 34*  CREATININE 2.14* 2.10*  CALCIUM  10.3  --    Liver Function Tests: Recent Labs  Lab 03/27/24 0843  AST 48*  ALT 7  ALKPHOS 1,266*  BILITOT 0.5  PROT 7.3  ALBUMIN 3.6   No results for input(s): LIPASE, AMYLASE in the last 168 hours. No results for input(s): AMMONIA in the last 168 hours. CBC: Recent Labs  Lab 03/27/24 0843 03/27/24 0857  WBC 10.2  --   NEUTROABS 8.7*  --   HGB 6.8* 7.5*  HCT 23.3* 22.0*  MCV 78.5*  --   PLT 171  --    Cardiac Enzymes: No results for input(s): CKTOTAL, CKMB, CKMBINDEX, TROPONINI in the last 168 hours. BNP: Invalid input(s):  POCBNP CBG: No results for input(s): GLUCAP in the last 168 hours. D-Dimer No results for input(s): DDIMER in the last 72 hours. Hgb A1c No results for input(s): HGBA1C in the last 72 hours. Lipid Profile No results for input(s): CHOL, HDL, LDLCALC, TRIG, CHOLHDL, LDLDIRECT in the last 72 hours. Thyroid  function studies No results for input(s): TSH, T4TOTAL, T3FREE, THYROIDAB in the last 72 hours.  Invalid input(s): FREET3 Anemia work up No results for input(s): VITAMINB12, FOLATE, FERRITIN, TIBC, IRON , RETICCTPCT in the last 72 hours. Urinalysis    Component Value Date/Time   COLORURINE YELLOW 01/04/2024 1347   APPEARANCEUR CLEAR 01/04/2024 1347   APPEARANCEUR Turbid (A) 03/01/2021 1225   LABSPEC 1.032 (H) 01/04/2024 1347   PHURINE 5.0 01/04/2024 1347   GLUCOSEU NEGATIVE 01/04/2024  1347   HGBUR NEGATIVE 01/04/2024 1347   BILIRUBINUR NEGATIVE 01/04/2024 1347   BILIRUBINUR Negative 03/01/2021 1225   KETONESUR NEGATIVE 01/04/2024 1347   PROTEINUR 30 (A) 01/04/2024 1347   UROBILINOGEN 0.2 04/10/2020 0940   UROBILINOGEN 0.2 08/13/2014 0934   NITRITE NEGATIVE 01/04/2024 1347   LEUKOCYTESUR NEGATIVE 01/04/2024 1347   Sepsis Labs Recent Labs  Lab 03/27/24 0843  WBC 10.2   Microbiology No results found for this or any previous visit (from the past 240 hours).   Time coordinating discharge: Over 30 minutes  SIGNED:   Camellia [name] Greggory Safranek, DO  [facility] 03/28/2024, 12:12 PM

## 2024-03-28 NOTE — Progress Notes (Signed)
 Troy Mclaughlin [address] [facility] Liaison Note   Troy Mclaughlin is a current patient with [facility] with a terminal diagnosis of  metastatic prostate cancer. Patient was having severe abdominal pain and presented to the ED.  He was admitted 11.05.2025 for intractable pain.  Per Dr. Norleen Sella with [facility] this is a related hospital admission. Patient is a DNR.   Visited with patient at the bedside. Patient is lethargic and in a lot of pain. Let him know that the MD is going to be discharging him today and that he had agreed to go to [facility] for pain management. Spoke with beside [employer]. Requested RN to give pt. additional pain medication per patient's request.    Patient remains inpatient appropriate for pain management and he has agreed for transfer to [facility] today.    V/S: 97.6, 129/96, 107, 18, Sats 100% on 6LNC   I/O: not recorded   Abnormal Labs: Alkaline Phosphate 1,266  Hgb 7.5  Diagnostics:    CT abdomen/pelvis without contrast with no acute findings in abdomen/pelvis, 6 mm nonobstructing left renal stone unchanged, several small liver hypodensities too small to characterize but likely cysts and unchanged, extensive sclerotic metastasis throughout the axial and appendicular skeleton without significant change compatible with patient's metastatic prostate cancer.      IV/PRN medications: hydromorphone0.-2mg  IV inj Q30 minutes x5, Robinul  0.2 mg IV inj Q4H x1, Zofran  4mg  inj Q6H x2.   Recommendations/Plan per Dr. 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 daily -- OxyContin  30 mg p.o. every 8 hours -- Methadone  5 mg p.o. 3 times daily -- Oxycodone  20 mg  p.o. every 4 hours as needed moderate pain -- Dilaudid  0.5-2 mg IV every 30 minutes as needed severe pain/distress -- Ativan  1 mg p.o. every 4 hours as needed anxiety -- Bowel regimen with senna, MiraLAX  -- TOC consultation for referral to residential hospice   Anemia of chronic medical disease Hemoglobin 6.8 on admission, transfuse 1 unit PRBC with improvement of hemoglobin to 7.5.  Currently on hospice/full comfort measures.   DM2 Hemoglobin A1c 6.1 on 09/06/2023. -- Comfort feeds as tolerated   Hypotension Hx HTN Blood pressure on arrival 57/12, improved after IV fluid resuscitation and blood transfusion.  At baseline on amlodipine  10 mg p.o. daily, carvedilol  12.5 mg p.o. twice daily. -- BP 129/96 this morning -- Discontinue home and hypertensives given full comfort measures   BPH Monitor urine output.  If concerns for any obstructive uropathy, plan Foley catheter placement for comfort.         DVT prophylaxis: measures     Code Status: Do not attempt resuscitation (DNR) - Comfort care Family Communication: Updated patient's nephew present at bedside this morning   Disposition Plan:  Level of care: Palliative Care Status is: Inpatient Remains inpatient appropriate because: Pending referral to residential hospice     Consultants:  Palliative care   Procedures:  None   Antimicrobials:  None     Subjective: Patient seen examined bedside, lying in bed.  Nephew present at bedside.  Just received IV pain medication and about to fall asleep.  Reports diffuse pain, improved with pain regimen.  RN requesting transition to liquid form medications due to concerns of his ability to swallow.  Seen by  palliative care, recommending referral to residential hospice.  Patient with no other questions or concerns at this time.  Denies headache, no chest pain, no shortness of breath, no nausea/vomiting, no diarrhea, no fever.  No acute concerns overnight per nursing staff.   Medically  stable for discharge to residential hospice once bed available.   Objective:       Vitals:    03/27/24 0758 03/27/24 1618 03/27/24 1639 03/28/24 0557  BP: (!) 57/12 (!) 136/102 133/86 (!) 129/96  Pulse: (!) 118 (!) 106 (!) 108 (!) 107  Resp:   16 16 18   Temp:       97.6 F (36.4 C)  SpO2: (!) 79% 100% 100% 100%      Intake/Output Summary (Last 24 hours) at 03/28/2024 1043 Last data filed at 03/28/2024 0500    Gross per 24 hour  Intake 2009 ml  Output 100 ml  Net 1909 ml    There were no vitals filed for this visit.   Examination:   Physical Exam: GEN: NAD, alert and oriented x 3, chronically ill, cachectic in appearance HEENT: NCAT, PERRL, EOMI, sclera clear, dry mucous membranes PULM: CTAB w/o wheezes/crackles, normal respiratory effort, on 4 L nasal cannula with SpO2 100% at rest CV: RRR  GI: abd soft, NTND, + BS MSK: no peripheral edema, moves all extremities independently       Data Reviewed: I have personally reviewed following labs and imaging studies   CBC: Last Labs      Recent Labs  Lab 03/27/24 0843 03/27/24 0857  WBC 10.2  --   NEUTROABS 8.7*  --   HGB 6.8* 7.5*  HCT 23.3* 22.0*  MCV 78.5*  --   PLT 171  --       Basic Metabolic Panel: Last Labs      Recent Labs  Lab 03/27/24 0843 03/27/24 0857  NA 135 130*  K 4.1 3.8  CL 88* 96*  CO2 7*  --   GLUCOSE 102* 96  BUN 34* 34*  CREATININE 2.14* 2.10*  CALCIUM  10.3  --       GFR: CrCl cannot be calculated (Unknown ideal weight.). Liver Function Tests: Last Labs     Recent Labs  Lab 03/27/24 0843  AST 48*  ALT 7  ALKPHOS 1,266*  BILITOT 0.5  PROT 7.3  ALBUMIN 3.6      Last Labs  No results for input(s): LIPASE, AMYLASE in the last 168 hours.   Last Labs  No results for input(s): AMMONIA in the last 168 hours.   Coagulation Profile: Last Labs  No results for input(s): INR, PROTIME in the last 168 hours.   Cardiac Enzymes: Last Labs  No results for input(s):  CKTOTAL, CKMB, CKMBINDEX, TROPONINI in the last 168 hours.   BNP (last 3 results) Recent Labs (within last 365 days)  No results for input(s): PROBNP in the last 8760 hours.   HbA1C: Recent Labs (last 2 labs)  No results for input(s): HGBA1C in the last 72 hours.   CBG: Last Labs  No results for input(s): GLUCAP in the last 168 hours.   Lipid Profile: Recent Labs (last 2 labs)  No results for input(s): CHOL, HDL, LDLCALC, TRIG, CHOLHDL, LDLDIRECT in the last 72 hours.   Thyroid  Function Tests: Recent Labs (last 2 labs)  No results for input(s): TSH, T4TOTAL, FREET4, T3FREE, THYROIDAB in the last 72 hours.   Anemia Panel: Recent Labs (last 2 labs)  No results for input(s): VITAMINB12,  FOLATE, FERRITIN, TIBC, IRON , RETICCTPCT in the last 72 hours.   Sepsis Labs: Last Labs  No results for input(s): PROCALCITON, LATICACIDVEN in the last 168 hours.     No results found for this or any previous visit (from the past 240 hours).            Radiology Studies:  Imaging Results (Last 48 hours)  CT ABDOMEN PELVIS WO CONTRAST Result Date: 03/27/2024 CLINICAL DATA:  Abdominal pain. EXAM: CT ABDOMEN AND PELVIS WITHOUT CONTRAST TECHNIQUE: Multidetector CT imaging of the abdomen and pelvis was performed following the standard protocol without IV contrast. RADIATION DOSE REDUCTION: This exam was performed according to the departmental dose-optimization program which includes automated exposure control, adjustment of the mA and/or kV according to patient size and/or use of iterative reconstruction technique. COMPARISON:  01/04/2024 FINDINGS: Lower chest: Elevated left hemidiaphragm slightly worse. Heart is normal size. Visualized lung bases are clear. Minimal centrilobular emphysematous disease in within the lung bases. Hepatobiliary: Several small liver hypodensities too small to characterize but likely cysts and unchanged. Gallbladder and  biliary tree are normal. Pancreas: Normal. Spleen: Normal. Adrenals/Urinary Tract: Region of the left adrenal gland is normal. Right adrenal gland is normal. Kidneys are normal in size 6 mm stone over the mid to lower pole left kidney unchanged. No hydronephrosis. Ureters and bladder are normal. Stomach/Bowel: Surgical clips over the gastroesophageal junction and gastric antrum. Surgical clips over the left upper quadrant. Small bowel is unremarkable. Surgical clips over the distal transverse colon in the left abdomen. Remainder of the colon is unremarkable. Appendix is normal. Vascular/Lymphatic: Moderate calcified plaque over the abdominal aorta which is normal in caliber. No adenopathy. Reproductive: Fiducial markers over the prostate. Other: No free fluid or focal inflammatory change. Surgical clips over the anterior abdominal wall. Musculoskeletal: Extensive sclerotic metastases throughout the axial and appendicular skeleton without significant change. IMPRESSION: 1. No acute findings in the abdomen/pelvis. 2. 6 mm nonobstructing left renal stone unchanged. 3. Several small liver hypodensities too small to characterize, but likely cysts and unchanged. 4. Extensive sclerotic metastases throughout the axial and appendicular skeleton without significant change compatible patient's metastatic prostate cancer. 5. Aortic atherosclerosis. 6. Emphysema. Aortic Atherosclerosis (ICD10-I70.0) and Emphysema (ICD10-J43.9). Electronically Signed   By: Toribio Agreste M.D.   On: 03/27/2024 10:29    DG Chest Port 1 View Result Date: 03/27/2024 EXAM: 1 VIEW(S) XRAY OF THE CHEST 03/27/2024 09:44:00 AM COMPARISON: 01/04/2024 x-ray. Chest CT 01/04/2024. CLINICAL HISTORY: 68 year old male. Shortness of breath. FINDINGS: LUNGS AND PLEURA: Stable lung volumes. Streaky and confluent left lung opacity persists, superimposed on chronic bronchiectasis there by chest CT 01/04/2024. No acute pulmonary opacity. No pulmonary edema. No  pleural effusion. No pneumothorax. HEART AND MEDIASTINUM: Stable mediastinal contours. No acute abnormality of the cardiac silhouette. BONES AND SOFT TISSUES: Diffuse sclerotic osseous metastatic disease. Stable upper abdominal surgical clips. IMPRESSION: 1. No acute cardiopulmonary abnormality. 2. Diffuse sclerotic osseous metastatic disease. 3. Chronic lung disease with stable streaky and confluent left lung opacity, superimposed on bronchiectasis. Electronically signed by: Helayne Hurst MD 03/27/2024 09:52 AM EST RP Workstation: [id number]               Scheduled Meds:  dexamethasone   4 mg Oral Daily   methadone   5 mg Oral TID   oxyCODONE   30 mg Oral Q8H   polyethylene glycol  17 g Oral Daily   senna  1 tablet Oral Daily   traZODone   50 mg Oral QHS  Continuous Infusions:         LOS: 1 day      Time spent: 52 minutes spent on 03/28/2024 caring for this patient face-to-face including chart review, ordering labs/tests, documenting, discussion with nursing staff, consultants, updating family and interview/physical exam       Camellia [name] [place], DO [facility] Available via Epic secure chat 7am-7pm After these hours, please refer to coverage provider listed on [email or web address] 03/28/2024, 10:43 AM                   Discharge Planning: pt will dc today to [facility]   Family contact: wife Rosita Slade and daughter Valarie Slade   IDT: updated   Goals of care: clear, comfort focused care, DNR   Should patient need ambulance transport, please use [facility] as they contract this service for our active hospice patients.   Please call with any hospice questions or concerns.   Greig Basket, BSN, RN [facility] Liaison [phone]

## 2024-03-28 NOTE — Progress Notes (Signed)
 PROGRESS NOTE    Troy Mclaughlin  FMW:[id number] DOB: [birth year] DOA: 03/27/2024 PCP: Oley Bascom RAMAN, NP    Brief Narrative:   Troy Mclaughlin is a 68 y.o. male with past medical history significant for prostate cancer with metastasis to bone, DM2, HTN, BPH, history of gastric ulcer, EtOH gastritis who is currently followed by hospice at home who presented to [facility] ED on 03/27/2024 via EMS for abdominal pain.  Patient attributes pain all over to his cancer.  Asking for pain medications.  Denies shortness of breath, nausea or vomiting.  Patient currently on full comfort care with hospice at home which was confirmed with patient's oncologist and spouse by EDP.  In the ED, temperature 97.6 F, HR 118, RR 16, BP 57/12, SpO2 79% on nasal cannula.  WBC 10.2, hemoglobin 6.8, platelet count 171.  Sodium 135, potassium 4.1, chloride 88, CO2 7, glucose 102, BUN 34, creatinine 2.14.  AST 48, ALT 7, total bilirubin 0.5.  Chest x-ray with no acute cardiopulmonary abnormality, diffuse sclerotic osseous metastasis disease, chronic lung disease with stable streaky and confluent left lung opacity superimposed on bronchiectasis.  CT abdomen/pelvis without contrast with no acute findings in abdomen/pelvis, 6 mm nonobstructing left renal stone unchanged, several small liver hypodensities too small to characterize but likely cysts and unchanged, extensive sclerotic metastasis throughout the axial and appendicular skeleton without significant change compatible with patient's metastatic prostate cancer.  Patient received IV fluid bolus and blood transfusion with stabilization of vital signs.  TRH consulted for admission for further evaluation and management of intractable pain of malignancy.  Assessment & Plan:   Intractable pain of malignancy Prostate cancer with extensive metastasis to axial/appendicular skeleton End-of-life/full comfort care Adult failure to thrive -- Palliative care following, appreciate  assistance -- Dexamethasone  4 mg p.o. daily -- OxyContin  30 mg p.o. every 8 hours -- Methadone  5 mg p.o. 3 times daily -- Oxycodone  20 mg p.o. every 4 hours as needed moderate pain -- Dilaudid  0.5-2 mg IV every 30 minutes as needed severe pain/distress -- Ativan  1 mg p.o. every 4 hours as needed anxiety -- Bowel regimen with senna, MiraLAX  -- TOC consultation for referral to residential hospice  Anemia of chronic medical disease Hemoglobin 6.8 on admission, transfuse 1 unit PRBC with improvement of hemoglobin to 7.5.  Currently on hospice/full comfort measures.  DM2 Hemoglobin A1c 6.1 on 09/06/2023. -- Comfort feeds as tolerated  Hypotension Hx HTN Blood pressure on arrival 57/12, improved after IV fluid resuscitation and blood transfusion.  At baseline on amlodipine  10 mg p.o. daily, carvedilol  12.5 mg p.o. twice daily. -- BP 129/96 this morning -- Discontinue home and hypertensives given full comfort measures  BPH Monitor urine output.  If concerns for any obstructive uropathy, plan Foley catheter placement for comfort.     DVT prophylaxis: Comfort measures    Code Status: Do not attempt resuscitation (DNR) - Comfort care Family Communication: Updated patient's nephew present at bedside this morning  Disposition Plan:  Level of care: Palliative Care Status is: Inpatient Remains inpatient appropriate because: Pending referral to residential hospice    Consultants:  Palliative care  Procedures:  None  Antimicrobials:  None   Subjective: Patient seen examined bedside, lying in bed.  Nephew present at bedside.  Just received IV pain medication and about to fall asleep.  Reports diffuse pain, improved with pain regimen.  RN requesting transition to liquid form medications due to concerns of his ability to swallow.  Seen by palliative care, recommending  referral to residential hospice.  Patient with no other questions or concerns at this time.  Denies headache, no chest  pain, no shortness of breath, no nausea/vomiting, no diarrhea, no fever.  No acute concerns overnight per nursing staff.  Medically stable for discharge to residential hospice once bed available.  Objective: Vitals:   03/27/24 0758 03/27/24 1618 03/27/24 1639 03/28/24 0557  BP: (!) 57/12 (!) 136/102 133/86 (!) 129/96  Pulse: (!) 118 (!) 106 (!) 108 (!) 107  Resp:  16 16 18   Temp:    97.6 F (36.4 C)  SpO2: (!) 79% 100% 100% 100%    Intake/Output Summary (Last 24 hours) at 03/28/2024 1043 Last data filed at 03/28/2024 0500 Gross per 24 hour  Intake 2009 ml  Output 100 ml  Net 1909 ml   There were no vitals filed for this visit.  Examination:  Physical Exam: GEN: NAD, alert and oriented x 3, chronically ill, cachectic in appearance HEENT: NCAT, PERRL, EOMI, sclera clear, dry mucous membranes PULM: CTAB w/o wheezes/crackles, normal respiratory effort, on 4 L nasal cannula with SpO2 100% at rest CV: RRR  GI: abd soft, NTND, + BS MSK: no peripheral edema, moves all extremities independently    Data Reviewed: I have personally reviewed following labs and imaging studies  CBC: Recent Labs  Lab 03/27/24 0843 03/27/24 0857  WBC 10.2  --   NEUTROABS 8.7*  --   HGB 6.8* 7.5*  HCT 23.3* 22.0*  MCV 78.5*  --   PLT 171  --    Basic Metabolic Panel: Recent Labs  Lab 03/27/24 0843 03/27/24 0857  NA 135 130*  K 4.1 3.8  CL 88* 96*  CO2 7*  --   GLUCOSE 102* 96  BUN 34* 34*  CREATININE 2.14* 2.10*  CALCIUM  10.3  --    GFR: CrCl cannot be calculated (Unknown ideal weight.). Liver Function Tests: Recent Labs  Lab 03/27/24 0843  AST 48*  ALT 7  ALKPHOS 1,266*  BILITOT 0.5  PROT 7.3  ALBUMIN 3.6   No results for input(s): LIPASE, AMYLASE in the last 168 hours. No results for input(s): AMMONIA in the last 168 hours. Coagulation Profile: No results for input(s): INR, PROTIME in the last 168 hours. Cardiac Enzymes: No results for input(s): CKTOTAL,  CKMB, CKMBINDEX, TROPONINI in the last 168 hours. BNP (last 3 results) No results for input(s): PROBNP in the last 8760 hours. HbA1C: No results for input(s): HGBA1C in the last 72 hours. CBG: No results for input(s): GLUCAP in the last 168 hours. Lipid Profile: No results for input(s): CHOL, HDL, LDLCALC, TRIG, CHOLHDL, LDLDIRECT in the last 72 hours. Thyroid  Function Tests: No results for input(s): TSH, T4TOTAL, FREET4, T3FREE, THYROIDAB in the last 72 hours. Anemia Panel: No results for input(s): VITAMINB12, FOLATE, FERRITIN, TIBC, IRON , RETICCTPCT in the last 72 hours. Sepsis Labs: No results for input(s): PROCALCITON, LATICACIDVEN in the last 168 hours.  No results found for this or any previous visit (from the past 240 hours).       Radiology Studies: CT ABDOMEN PELVIS WO CONTRAST Result Date: 03/27/2024 CLINICAL DATA:  Abdominal pain. EXAM: CT ABDOMEN AND PELVIS WITHOUT CONTRAST TECHNIQUE: Multidetector CT imaging of the abdomen and pelvis was performed following the standard protocol without IV contrast. RADIATION DOSE REDUCTION: This exam was performed according to the departmental dose-optimization program which includes automated exposure control, adjustment of the mA and/or kV according to patient size and/or use of iterative reconstruction technique. COMPARISON:  01/04/2024  FINDINGS: Lower chest: Elevated left hemidiaphragm slightly worse. Heart is normal size. Visualized lung bases are clear. Minimal centrilobular emphysematous disease in within the lung bases. Hepatobiliary: Several small liver hypodensities too small to characterize but likely cysts and unchanged. Gallbladder and biliary tree are normal. Pancreas: Normal. Spleen: Normal. Adrenals/Urinary Tract: Region of the left adrenal gland is normal. Right adrenal gland is normal. Kidneys are normal in size 6 mm stone over the mid to lower pole left kidney unchanged. No  hydronephrosis. Ureters and bladder are normal. Stomach/Bowel: Surgical clips over the gastroesophageal junction and gastric antrum. Surgical clips over the left upper quadrant. Small bowel is unremarkable. Surgical clips over the distal transverse colon in the left abdomen. Remainder of the colon is unremarkable. Appendix is normal. Vascular/Lymphatic: Moderate calcified plaque over the abdominal aorta which is normal in caliber. No adenopathy. Reproductive: Fiducial markers over the prostate. Other: No free fluid or focal inflammatory change. Surgical clips over the anterior abdominal wall. Musculoskeletal: Extensive sclerotic metastases throughout the axial and appendicular skeleton without significant change. IMPRESSION: 1. No acute findings in the abdomen/pelvis. 2. 6 mm nonobstructing left renal stone unchanged. 3. Several small liver hypodensities too small to characterize, but likely cysts and unchanged. 4. Extensive sclerotic metastases throughout the axial and appendicular skeleton without significant change compatible patient's metastatic prostate cancer. 5. Aortic atherosclerosis. 6. Emphysema. Aortic Atherosclerosis (ICD10-I70.0) and Emphysema (ICD10-J43.9). Electronically Signed   By: Toribio Agreste M.D.   On: 03/27/2024 10:29   DG Chest Port 1 View Result Date: 03/27/2024 EXAM: 1 VIEW(S) XRAY OF THE CHEST 03/27/2024 09:44:00 AM COMPARISON: 01/04/2024 x-ray. Chest CT 01/04/2024. CLINICAL HISTORY: 68 year old male. Shortness of breath. FINDINGS: LUNGS AND PLEURA: Stable lung volumes. Streaky and confluent left lung opacity persists, superimposed on chronic bronchiectasis there by chest CT 01/04/2024. No acute pulmonary opacity. No pulmonary edema. No pleural effusion. No pneumothorax. HEART AND MEDIASTINUM: Stable mediastinal contours. No acute abnormality of the cardiac silhouette. BONES AND SOFT TISSUES: Diffuse sclerotic osseous metastatic disease. Stable upper abdominal surgical clips. IMPRESSION:  1. No acute cardiopulmonary abnormality. 2. Diffuse sclerotic osseous metastatic disease. 3. Chronic lung disease with stable streaky and confluent left lung opacity, superimposed on bronchiectasis. Electronically signed by: Helayne Hurst MD 03/27/2024 09:52 AM EST RP Workstation: [id number]        Scheduled Meds:  dexamethasone   4 mg Oral Daily   methadone   5 mg Oral TID   oxyCODONE   30 mg Oral Q8H   polyethylene glycol  17 g Oral Daily   senna  1 tablet Oral Daily   traZODone   50 mg Oral QHS   Continuous Infusions:   LOS: 1 day    Time spent: 52 minutes spent on 03/28/2024 caring for this patient face-to-face including chart review, ordering labs/tests, documenting, discussion with nursing staff, consultants, updating family and interview/physical exam    Camellia [name] Shandie Bertz, DO [facility] Available via Epic secure chat 7am-7pm After these hours, please refer to coverage provider listed on [email or web address] 03/28/2024, 10:43 AM

## 2024-03-28 NOTE — Care Management Important Message (Signed)
 Important Message  Patient Details No IM Letter given due to Comfort Care. Name: Troy Mclaughlin MRN: [id number] Date of Birth: May 05, 1956   Important Message Given:  No     Melba Ates 03/28/2024, 1:38 PM

## 2024-03-28 NOTE — Plan of Care (Addendum)
 Report called to [facility], AVS Printed and left in packet for [facility], PIV stayed per nurse request. IV Dilaudid  administered @ 1355. 1435: Patient picked up by transport and heading to [facility] place. Left in stable condition  Problem: Coping: Goal: Ability to identify and develop effective coping behavior will improve Outcome: Adequate for Discharge   Problem: Clinical Measurements: Goal: Quality of life will improve Outcome: Adequate for Discharge   Problem: Respiratory: Goal: Verbalizations of increased ease of respirations will increase Outcome: Adequate for Discharge   Problem: Role Relationship: Goal: Family's ability to cope with current situation will improve Outcome: Adequate for Discharge Goal: Ability to verbalize concerns, feelings, and thoughts to partner or family member will improve Outcome: Adequate for Discharge   Problem: Pain Management: Goal: Satisfaction with pain management regimen will improve Outcome: Adequate for Discharge   Problem: Health Behavior/Discharge Planning: Goal: Ability to manage health-related needs will improve Outcome: Adequate for Discharge   Problem: Clinical Measurements: Goal: Ability to maintain clinical measurements within normal limits will improve Outcome: Adequate for Discharge Goal: Will remain free from infection Outcome: Adequate for Discharge Goal: Diagnostic test results will improve Outcome: Adequate for Discharge Goal: Respiratory complications will improve Outcome: Adequate for Discharge Goal: Cardiovascular complication will be avoided Outcome: Adequate for Discharge   Problem: Activity: Goal: Risk for activity intolerance will decrease Outcome: Adequate for Discharge   Problem: Nutrition: Goal: Adequate nutrition will be maintained Outcome: Adequate for Discharge   Problem: Coping: Goal: Level of anxiety will decrease Outcome: Adequate for Discharge   Problem: Elimination: Goal: Will not  experience complications related to bowel motility Outcome: Adequate for Discharge Goal: Will not experience complications related to urinary retention Outcome: Adequate for Discharge   Problem: Pain Managment: Goal: General experience of comfort will improve and/or be controlled Outcome: Adequate for Discharge   Problem: Safety: Goal: Ability to remain free from injury will improve Outcome: Adequate for Discharge   Problem: Skin Integrity: Goal: Risk for impaired skin integrity will decrease Outcome: Adequate for Discharge

## 2024-03-28 NOTE — Progress Notes (Signed)
 Daily Progress Note   Patient Name: Troy Mclaughlin       Date: 03/28/2024 DOB: [birth year]  Age: 68 y.o. MRN#: [id number] Attending Physician: Austria, Eric J, DO Primary Care Physician: Oley Bascom RAMAN, NP Admit Date: 03/27/2024  Reason for Consultation/Follow-up: Non pain symptom management, Pain control, Psychosocial/spiritual support, and Terminal Care  Subjective: I have reviewed medical records including EPIC notes, MAR, no available advanced directives. Received report from primary RN -no acute concerns. Noted as needed medications administered in the last 24 hours: dilaudid  x4, zofran  x1.  Went to visit patient at bedside-no family/visitors present.  Patient was lying in bed asleep -he appeared to fall asleep while using the urinal. No signs or non-verbal gestures of pain or discomfort noted. No respiratory distress, increased work of breathing, or secretions noted.  Patient endorses improved pain from yesterday; however, still reports 7 out of 10 pain.  He reports feeling pretty good.  Patient's main concern today is pain with coughing.  Weak, nonproductive, congested cough noted.  He is on 4 L O2 nasal cannula.  Emotional support provided to patient.  Symptom management discussed to start daily steroid for bone pain, robitussin and robinul  for cough, and possible adjustment to oxycodone /OxyContin . Encourage use of oral medications over IV if goal is for return home. Discussed option for patient's discharge to [facility] for ongoing symptom management per hospice team - patient is agreeable.   9:16 AM Attempted to call wife/[name] to discuss transfer to [facility] - no answer.   12:04 PM Notified by hospice liaison that [facility] is able to offer bed today - patient to be  transferred.  Length of Stay: 1  Current Medications: Scheduled Meds:   methadone   5 mg Oral TID   oxyCODONE   30 mg Oral Q8H   polyethylene glycol  17 g Oral Daily   senna  1 tablet Oral Daily   traZODone   50 mg Oral QHS    Continuous Infusions:   PRN Meds: acetaminophen  **OR** acetaminophen , antiseptic oral rinse, artificial tears, HYDROmorphone  (DILAUDID ) injection, LORazepam , ondansetron  **OR** ondansetron  (ZOFRAN ) IV, oxyCODONE   Physical Exam Vitals and nursing note reviewed.  Constitutional:      General: He is not in acute distress.    Appearance: He is cachectic. He is ill-appearing.  Pulmonary:     Effort:  No respiratory distress.  Skin:    General: Skin is warm and dry.  Neurological:     Mental Status: He is alert and oriented to person, place, and time.  Psychiatric:        Attention and Perception: Attention normal.        Behavior: Behavior is cooperative.        Cognition and Memory: Cognition and memory normal.             Vital Signs: BP (!) 129/96 (BP Location: Right Arm)   Pulse (!) 107   Temp 97.6 F (36.4 C)   Resp 18   SpO2 100%  SpO2: SpO2: 100 % O2 Device: O2 Device: Nasal Cannula O2 Flow Rate: O2 Flow Rate (L/min): 15 L/min  Intake/output summary:  Intake/Output Summary (Last 24 hours) at 03/28/2024 0841 Last data filed at 03/28/2024 0500 Gross per 24 hour  Intake 2009 ml  Output 100 ml  Net 1909 ml   LBM: Last BM Date :  (PTA) Baseline Weight:   Most recent weight:         Palliative Assessment/Data: PPS 30%      Patient Active Problem List   Diagnosis Date Noted   Admission for end of life care 03/27/2024   Constipation 01/05/2024   Need for emotional support 01/05/2024   DNR (do not resuscitate) discussion 01/05/2024   ACP (advance care planning) 01/05/2024   Counseling and coordination of care 01/05/2024   Prostate cancer (HCC) 01/05/2024   Palliative care encounter 01/05/2024   High risk medication use 01/05/2024    Medication management 01/05/2024   Cancer related pain 12/15/2023   Hypophosphatemia 12/15/2023   Anemia, 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   Hyponatremia 12/14/2023   Intractable pain 11/09/2023   Diarrhea 11/09/2023   Goals of care, counseling/discussion 07/17/2023   CAP (community acquired pneumonia) 07/13/2023   Hypomagnesemia 07/13/2023   Viral URI 07/13/2023   At risk for side effect of medication 06/19/2023   Cancer associated pain 05/22/2023   B12 deficiency anemia 05/21/2023   Genetic testing 05/03/2023   Microcytic anemia 04/10/2023   Lymphopenia 04/10/2023   Metastatic malignant neoplasm (HCC) 03/09/2023   Anemia of chronic disease 11/30/2021   Mixed 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) 11/26/2021   BPH (benign prostatic hyperplasia) 11/26/2021   Chest pain 11/26/2021   SBO (small bowel obstruction) (HCC) 11/25/2021   Malignant neoplasm of prostate (HCC) 12/15/2020   Influenza vaccine needed 04/10/2020   Protein-calorie malnutrition, severe 04/01/2019   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   History of stomach ulcers    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HCC)    Diabetes mellitus without complication (HCC)    Alcohol  abuse    Acute gastric ulcer with hemorrhage    GI bleed 03/30/2019   Abdominal pain, left lower quadrant    Blood in the stool    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   Nausea and vomiting 05/27/2018   Hyperkalemia 05/27/2018   Erectile dysfunction 02/09/2017   Tobacco dependence 02/09/2017   Hyperlipidemia 01/18/2017   Gastroesophageal reflux disease with esophagitis    Hypokalemia    Renal stone    Type 2 diabetes mellitus without complication, without long-term current use of insulin  (HCC) 08/16/2016   Abdominal wall pain    Coronary artery disease    Atypical chest pain    Essential hypertension    History of ST elevation myocardial infarction (STEMI) 07/06/2013   History of small bowel obstruction  07/29/2008  Tobacco abuse 04/15/2008   GLAUCOMA 04/15/2008    Palliative Care Assessment & Plan   Patient Profile: 68 y.o. male  with past medical history of prostate cancer with metastasis, cancer related pain, DM-2, HTN, gastric ulcer, EtOH gastritis and BPH admitted on 03/27/2024 with abdominal pain and vomiting.    During evaluation in the ED, he was found to be anemic to 6.8 and hypotensive to 57/12 with oxygen saturation of 79% on 15 L by HFNC in ED. he was stabilized with some IV fluids.  Transfusion of blood was pending at time of my evaluation.  Discussed with ED physician and hospitalist.   PMT has been consulted to assist with goals of care conversation/comfort care.  Assessment: Principal Problem:   Admission for end of life care   Recommendations/Plan: Continue full comfort measures Continue DNR/DNI as previously documented Comfort medication regimen adjusted as noted below Transfer to [facility] today for ongoing symptom management  Symptom Management Dilaudid  PRN pain/dyspnea/increased work of breathing/RR>25 Tylenol  PRN pain/fever Biotin PRN dry mouth Added robinul  PRN secretions/cough Ativan  PRN anxiety/seizure/sleep/distress Zofran  PRN nausea/vomiting Added Robitussin PRN  cough Scheduled dexamethazone 4mg  daily for bony pain  Goals of Care and Additional Recommendations: Limitations on Scope of Treatment: Full Comfort Care  Code Status:    Code Status Orders  (From admission, onward)           Start     Ordered   03/27/24 1503  Do not attempt resuscitation (DNR) - Comfort care  Continuous       Question Answer Comment  If patient has no pulse and is not breathing Do Not Attempt Resuscitation   In Pre-Arrest Conditions (Patient Is Breathing and Has a Pulse) Provide comfort measures. Relieve any mechanical airway obstruction. Avoid transfer unless required for comfort.   Consent: Discussion documented in EHR or advanced directives reviewed       03/27/24 1503           Code Status History     Date Active Date Inactive Code Status Order ID Comments User Context   03/27/2024 1359 03/27/2024 1503 Do not attempt resuscitation (DNR) - Comfort care [id number]  Wonda Mickle SQUIBB, PA-C ED   03/27/2024 1107 03/27/2024 1359 Do not attempt resuscitation (DNR) - Comfort care [id number]  Suzette Pac, MD ED   01/04/2024 2105 01/10/2024 0048 Full Code [id number]  Tobie Jorie SAUNDERS, MD ED   12/14/2023 1944 12/19/2023 2150 Full Code [id number]  Silvester Ales, MD ED   11/09/2023 0340 11/12/2023 1926 Full Code [id number]  Debby Camila LABOR, MD ED   07/13/2023 1645 07/19/2023 2019 Full Code [id number]  Celinda Alm Lot, MD ED   11/25/2021 2043 11/30/2021 2022 Full Code [id number]  Mansy, Madison LABOR, MD ED   03/30/2019 1812 04/03/2019 1814 Full Code [id number]  Madelyne Owen LABOR, MD Inpatient   05/27/2018 1941 05/30/2018 1900 Full Code [id number]  Viviana Elveria [name], MD ED   08/16/2016 2247 08/18/2016 1852 Full Code [id number]  Danford, Lonni SQUIBB, MD Inpatient   01/13/2016 2252 01/16/2016 1602 Full Code [id number]  Franky Redia SAILOR, MD Inpatient   05/17/2014 1614 05/19/2014 2116 Full Code [id number]  Marylu Leita SAUNDERS, NP Inpatient   07/06/2013 1919 07/09/2013 2036 Full Code [id number]  Wonda Sharper, MD Inpatient   07/06/2013 1830 07/06/2013 1919 Full Code [id number]  Marcine Caffie HERO, PA-C Inpatient   07/31/2012 2340 08/01/2012 1938 Full Code [id number]  Garrick Charleston, MD ED       Prognosis:  < 4 weeks  Discharge Planning: Hospice facility  Care plan was discussed with primary RN, patient, TOC, hospice liaison, attending  Thank you for allowing the Palliative Medicine Team to assist in the care of this patient.     Jeoffrey CHRISTELLA Sharps, NP  Please contact Palliative Medicine Team phone at [phone] for questions and concerns.   *Portions of this note are a verbal dictation therefore any spelling and/or grammatical errors are due to the Dragon [employer] One  system interpretation.

## 2024-03-31 LAB — BPAM RBC
Blood Product Expiration Date: 202512042359
Unit Type and Rh: 5100

## 2024-03-31 LAB — TYPE AND SCREEN
ABO/RH(D): O POS
Antibody Screen: NEGATIVE
Unit division: 0

## 2024-04-22 DEATH — deceased
